# Patient Record
Sex: Female | Born: 1945
Health system: Southern US, Community
[De-identification: ages and names within clinical notes are randomized; demographics above are authoritative.]

## PROBLEM LIST (undated history)

## (undated) DIAGNOSIS — I4892 Unspecified atrial flutter: Secondary | ICD-10-CM

## (undated) DIAGNOSIS — M797 Fibromyalgia: Secondary | ICD-10-CM

## (undated) DIAGNOSIS — K449 Diaphragmatic hernia without obstruction or gangrene: Secondary | ICD-10-CM

## (undated) DIAGNOSIS — G9332 Myalgic encephalomyelitis/chronic fatigue syndrome: Secondary | ICD-10-CM

## (undated) DIAGNOSIS — Z789 Other specified health status: Secondary | ICD-10-CM

## (undated) DIAGNOSIS — I350 Nonrheumatic aortic (valve) stenosis: Secondary | ICD-10-CM

## (undated) DIAGNOSIS — I493 Ventricular premature depolarization: Secondary | ICD-10-CM

## (undated) DIAGNOSIS — Z972 Presence of dental prosthetic device (complete) (partial): Secondary | ICD-10-CM

## (undated) DIAGNOSIS — K219 Gastro-esophageal reflux disease without esophagitis: Secondary | ICD-10-CM

## (undated) DIAGNOSIS — I10 Essential (primary) hypertension: Secondary | ICD-10-CM

## (undated) DIAGNOSIS — I441 Atrioventricular block, second degree: Secondary | ICD-10-CM

## (undated) DIAGNOSIS — Z960 Presence of urogenital implants: Secondary | ICD-10-CM

## (undated) DIAGNOSIS — I77810 Thoracic aortic ectasia: Secondary | ICD-10-CM

## (undated) DIAGNOSIS — G4733 Obstructive sleep apnea (adult) (pediatric): Secondary | ICD-10-CM

## (undated) DIAGNOSIS — M545 Low back pain, unspecified: Secondary | ICD-10-CM

## (undated) DIAGNOSIS — H8110 Benign paroxysmal vertigo, unspecified ear: Secondary | ICD-10-CM

## (undated) DIAGNOSIS — N3020 Other chronic cystitis without hematuria: Secondary | ICD-10-CM

## (undated) DIAGNOSIS — R5382 Chronic fatigue, unspecified: Secondary | ICD-10-CM

## (undated) DIAGNOSIS — Z973 Presence of spectacles and contact lenses: Secondary | ICD-10-CM

## (undated) DIAGNOSIS — I447 Left bundle-branch block, unspecified: Secondary | ICD-10-CM

## (undated) DIAGNOSIS — N3010 Interstitial cystitis (chronic) without hematuria: Secondary | ICD-10-CM

## (undated) DIAGNOSIS — S83207A Unspecified tear of unspecified meniscus, current injury, left knee, initial encounter: Secondary | ICD-10-CM

## (undated) DIAGNOSIS — G588 Other specified mononeuropathies: Secondary | ICD-10-CM

## (undated) DIAGNOSIS — G8929 Other chronic pain: Secondary | ICD-10-CM

## (undated) DIAGNOSIS — R3982 Chronic bladder pain: Secondary | ICD-10-CM

## (undated) DIAGNOSIS — M48062 Spinal stenosis, lumbar region with neurogenic claudication: Secondary | ICD-10-CM

## (undated) DIAGNOSIS — Z9889 Other specified postprocedural states: Secondary | ICD-10-CM

## (undated) DIAGNOSIS — D649 Anemia, unspecified: Secondary | ICD-10-CM

## (undated) DIAGNOSIS — R112 Nausea with vomiting, unspecified: Secondary | ICD-10-CM

## (undated) DIAGNOSIS — I2510 Atherosclerotic heart disease of native coronary artery without angina pectoris: Secondary | ICD-10-CM

## (undated) HISTORY — DX: Unspecified atrial flutter: I48.92

## (undated) HISTORY — DX: Fibromyalgia: M79.7

## (undated) HISTORY — DX: Left bundle-branch block, unspecified: I44.7

## (undated) HISTORY — DX: Other chronic cystitis without hematuria: N30.20

## (undated) HISTORY — DX: Anemia, unspecified: D64.9

## (undated) HISTORY — PX: TONSILLECTOMY: SUR1361

## (undated) HISTORY — DX: Nonrheumatic aortic (valve) stenosis: I35.0

## (undated) HISTORY — DX: Gastro-esophageal reflux disease without esophagitis: K21.9

## (undated) HISTORY — DX: Thoracic aortic ectasia: I77.810

## (undated) HISTORY — DX: Ventricular premature depolarization: I49.3

## (undated) HISTORY — PX: CARDIAC CATHETERIZATION: SHX172

## (undated) HISTORY — DX: Atrioventricular block, second degree: I44.1

## (undated) HISTORY — DX: Atherosclerotic heart disease of native coronary artery without angina pectoris: I25.10

---

## 1978-04-02 HISTORY — PX: ABDOMINAL HYSTERECTOMY: SHX81

## 1982-04-02 HISTORY — PX: OTHER SURGICAL HISTORY: SHX169

## 1984-04-02 HISTORY — PX: CARPAL TUNNEL RELEASE: SHX101

## 1985-04-02 HISTORY — PX: NASAL SINUS SURGERY: SHX719

## 1990-04-02 HISTORY — PX: CHOLECYSTECTOMY: SHX55

## 1997-08-19 ENCOUNTER — Other Ambulatory Visit: Admission: RE | Admit: 1997-08-19 | Discharge: 1997-08-19 | Payer: Self-pay | Admitting: Family Medicine

## 1997-10-01 ENCOUNTER — Other Ambulatory Visit: Admission: RE | Admit: 1997-10-01 | Discharge: 1997-10-01 | Payer: Self-pay | Admitting: Urology

## 1997-10-19 ENCOUNTER — Ambulatory Visit (HOSPITAL_COMMUNITY): Admission: RE | Admit: 1997-10-19 | Discharge: 1997-10-19 | Payer: Self-pay | Admitting: Neurosurgery

## 1998-02-17 ENCOUNTER — Ambulatory Visit (HOSPITAL_COMMUNITY): Admission: RE | Admit: 1998-02-17 | Discharge: 1998-02-17 | Payer: Self-pay | Admitting: Urology

## 1998-04-22 ENCOUNTER — Encounter: Payer: Self-pay | Admitting: Gastroenterology

## 1998-04-22 ENCOUNTER — Ambulatory Visit (HOSPITAL_COMMUNITY): Admission: RE | Admit: 1998-04-22 | Discharge: 1998-04-22 | Payer: Self-pay | Admitting: Gastroenterology

## 1998-05-04 ENCOUNTER — Encounter: Payer: Self-pay | Admitting: Gastroenterology

## 1998-05-04 ENCOUNTER — Ambulatory Visit (HOSPITAL_COMMUNITY): Admission: RE | Admit: 1998-05-04 | Discharge: 1998-05-04 | Payer: Self-pay | Admitting: Gastroenterology

## 1998-05-10 ENCOUNTER — Ambulatory Visit (HOSPITAL_COMMUNITY): Admission: RE | Admit: 1998-05-10 | Discharge: 1998-05-10 | Payer: Self-pay | Admitting: Gastroenterology

## 1998-07-13 ENCOUNTER — Ambulatory Visit (HOSPITAL_COMMUNITY): Admission: RE | Admit: 1998-07-13 | Discharge: 1998-07-13 | Payer: Self-pay | Admitting: Gastroenterology

## 1998-07-19 ENCOUNTER — Other Ambulatory Visit: Admission: RE | Admit: 1998-07-19 | Discharge: 1998-07-19 | Payer: Self-pay | Admitting: Obstetrics & Gynecology

## 1999-01-10 ENCOUNTER — Encounter: Admission: RE | Admit: 1999-01-10 | Discharge: 1999-04-10 | Payer: Self-pay | Admitting: Anesthesiology

## 1999-03-13 ENCOUNTER — Ambulatory Visit (HOSPITAL_COMMUNITY): Admission: RE | Admit: 1999-03-13 | Discharge: 1999-03-13 | Payer: Self-pay | Admitting: Urology

## 1999-06-03 ENCOUNTER — Inpatient Hospital Stay (HOSPITAL_COMMUNITY): Admission: EM | Admit: 1999-06-03 | Discharge: 1999-06-05 | Payer: Self-pay | Admitting: Emergency Medicine

## 1999-06-04 ENCOUNTER — Encounter: Payer: Self-pay | Admitting: Gastroenterology

## 1999-06-20 ENCOUNTER — Encounter (INDEPENDENT_AMBULATORY_CARE_PROVIDER_SITE_OTHER): Payer: Self-pay

## 1999-06-20 ENCOUNTER — Ambulatory Visit (HOSPITAL_COMMUNITY): Admission: RE | Admit: 1999-06-20 | Discharge: 1999-06-20 | Payer: Self-pay | Admitting: Gastroenterology

## 2000-05-02 ENCOUNTER — Ambulatory Visit (HOSPITAL_COMMUNITY): Admission: RE | Admit: 2000-05-02 | Discharge: 2000-05-02 | Payer: Self-pay | Admitting: Urology

## 2000-07-13 ENCOUNTER — Emergency Department (HOSPITAL_COMMUNITY): Admission: EM | Admit: 2000-07-13 | Discharge: 2000-07-13 | Payer: Self-pay | Admitting: Emergency Medicine

## 2000-07-30 ENCOUNTER — Ambulatory Visit (HOSPITAL_COMMUNITY): Admission: RE | Admit: 2000-07-30 | Discharge: 2000-07-30 | Payer: Self-pay | Admitting: Gastroenterology

## 2000-07-30 ENCOUNTER — Encounter (INDEPENDENT_AMBULATORY_CARE_PROVIDER_SITE_OTHER): Payer: Self-pay | Admitting: Specialist

## 2000-12-20 ENCOUNTER — Encounter: Admission: RE | Admit: 2000-12-20 | Discharge: 2000-12-20 | Payer: Self-pay | Admitting: Urology

## 2000-12-20 ENCOUNTER — Encounter: Payer: Self-pay | Admitting: Urology

## 2001-01-06 ENCOUNTER — Encounter: Admission: RE | Admit: 2001-01-06 | Discharge: 2001-01-06 | Payer: Self-pay | Admitting: Urology

## 2001-01-06 ENCOUNTER — Encounter: Payer: Self-pay | Admitting: Urology

## 2001-02-21 ENCOUNTER — Other Ambulatory Visit: Admission: RE | Admit: 2001-02-21 | Discharge: 2001-02-21 | Payer: Self-pay | Admitting: Obstetrics & Gynecology

## 2001-05-07 ENCOUNTER — Encounter: Payer: Self-pay | Admitting: Urology

## 2001-05-12 ENCOUNTER — Ambulatory Visit (HOSPITAL_COMMUNITY): Admission: RE | Admit: 2001-05-12 | Discharge: 2001-05-12 | Payer: Self-pay | Admitting: Urology

## 2001-07-03 ENCOUNTER — Encounter: Admission: RE | Admit: 2001-07-03 | Discharge: 2001-07-03 | Payer: Self-pay | Admitting: Otolaryngology

## 2001-07-03 ENCOUNTER — Encounter: Payer: Self-pay | Admitting: Otolaryngology

## 2001-07-30 ENCOUNTER — Emergency Department (HOSPITAL_COMMUNITY): Admission: EM | Admit: 2001-07-30 | Discharge: 2001-07-30 | Payer: Self-pay | Admitting: Emergency Medicine

## 2001-08-21 ENCOUNTER — Ambulatory Visit (HOSPITAL_COMMUNITY): Admission: RE | Admit: 2001-08-21 | Discharge: 2001-08-21 | Payer: Self-pay | Admitting: Interventional Cardiology

## 2002-04-13 ENCOUNTER — Encounter: Admission: RE | Admit: 2002-04-13 | Discharge: 2002-04-13 | Payer: Self-pay | Admitting: Urology

## 2002-04-13 ENCOUNTER — Encounter: Payer: Self-pay | Admitting: Urology

## 2002-04-16 ENCOUNTER — Ambulatory Visit (HOSPITAL_BASED_OUTPATIENT_CLINIC_OR_DEPARTMENT_OTHER): Admission: RE | Admit: 2002-04-16 | Discharge: 2002-04-16 | Payer: Self-pay | Admitting: Urology

## 2002-06-19 ENCOUNTER — Encounter: Payer: Self-pay | Admitting: Gastroenterology

## 2002-06-19 ENCOUNTER — Encounter: Admission: RE | Admit: 2002-06-19 | Discharge: 2002-06-19 | Payer: Self-pay | Admitting: Gastroenterology

## 2002-10-07 ENCOUNTER — Other Ambulatory Visit: Admission: RE | Admit: 2002-10-07 | Discharge: 2002-10-07 | Payer: Self-pay | Admitting: Obstetrics & Gynecology

## 2002-12-08 ENCOUNTER — Emergency Department (HOSPITAL_COMMUNITY): Admission: EM | Admit: 2002-12-08 | Discharge: 2002-12-08 | Payer: Self-pay | Admitting: Emergency Medicine

## 2003-04-22 ENCOUNTER — Ambulatory Visit (HOSPITAL_COMMUNITY): Admission: RE | Admit: 2003-04-22 | Discharge: 2003-04-22 | Payer: Self-pay | Admitting: Urology

## 2003-04-22 ENCOUNTER — Ambulatory Visit (HOSPITAL_BASED_OUTPATIENT_CLINIC_OR_DEPARTMENT_OTHER): Admission: RE | Admit: 2003-04-22 | Discharge: 2003-04-22 | Payer: Self-pay | Admitting: Urology

## 2004-01-05 ENCOUNTER — Emergency Department (HOSPITAL_COMMUNITY): Admission: EM | Admit: 2004-01-05 | Discharge: 2004-01-05 | Payer: Self-pay | Admitting: Emergency Medicine

## 2004-02-15 ENCOUNTER — Encounter: Admission: RE | Admit: 2004-02-15 | Discharge: 2004-05-15 | Payer: Self-pay | Admitting: Family Medicine

## 2004-05-16 ENCOUNTER — Encounter: Admission: RE | Admit: 2004-05-16 | Discharge: 2004-06-13 | Payer: Self-pay | Admitting: Family Medicine

## 2004-06-01 ENCOUNTER — Emergency Department (HOSPITAL_COMMUNITY): Admission: EM | Admit: 2004-06-01 | Discharge: 2004-06-01 | Payer: Self-pay | Admitting: Emergency Medicine

## 2004-07-24 ENCOUNTER — Ambulatory Visit (HOSPITAL_BASED_OUTPATIENT_CLINIC_OR_DEPARTMENT_OTHER): Admission: RE | Admit: 2004-07-24 | Discharge: 2004-07-24 | Payer: Self-pay | Admitting: Urology

## 2004-07-24 ENCOUNTER — Ambulatory Visit (HOSPITAL_COMMUNITY): Admission: RE | Admit: 2004-07-24 | Discharge: 2004-07-24 | Payer: Self-pay | Admitting: Urology

## 2004-10-11 ENCOUNTER — Ambulatory Visit (HOSPITAL_COMMUNITY): Admission: RE | Admit: 2004-10-11 | Discharge: 2004-10-11 | Payer: Self-pay | Admitting: Neurosurgery

## 2005-02-12 ENCOUNTER — Emergency Department (HOSPITAL_COMMUNITY): Admission: EM | Admit: 2005-02-12 | Discharge: 2005-02-13 | Payer: Self-pay | Admitting: Emergency Medicine

## 2005-06-30 ENCOUNTER — Emergency Department (HOSPITAL_COMMUNITY): Admission: EM | Admit: 2005-06-30 | Discharge: 2005-06-30 | Payer: Self-pay | Admitting: Emergency Medicine

## 2005-11-07 ENCOUNTER — Encounter: Admission: RE | Admit: 2005-11-07 | Discharge: 2005-11-07 | Payer: Self-pay | Admitting: Neurology

## 2006-06-26 ENCOUNTER — Encounter: Admission: RE | Admit: 2006-06-26 | Discharge: 2006-06-26 | Payer: Self-pay | Admitting: Obstetrics & Gynecology

## 2008-08-20 ENCOUNTER — Encounter: Admission: RE | Admit: 2008-08-20 | Discharge: 2008-11-18 | Payer: Self-pay | Admitting: Family Medicine

## 2008-11-23 ENCOUNTER — Encounter: Admission: RE | Admit: 2008-11-23 | Discharge: 2008-12-24 | Payer: Self-pay | Admitting: Family Medicine

## 2009-08-19 ENCOUNTER — Ambulatory Visit (HOSPITAL_BASED_OUTPATIENT_CLINIC_OR_DEPARTMENT_OTHER): Admission: RE | Admit: 2009-08-19 | Discharge: 2009-08-19 | Payer: Self-pay | Admitting: Urology

## 2009-08-22 ENCOUNTER — Emergency Department (HOSPITAL_BASED_OUTPATIENT_CLINIC_OR_DEPARTMENT_OTHER): Admission: EM | Admit: 2009-08-22 | Discharge: 2009-08-22 | Payer: Self-pay | Admitting: Emergency Medicine

## 2009-08-22 ENCOUNTER — Emergency Department (HOSPITAL_COMMUNITY): Admission: EM | Admit: 2009-08-22 | Discharge: 2009-08-22 | Payer: Self-pay | Admitting: Emergency Medicine

## 2010-04-22 ENCOUNTER — Encounter: Payer: Self-pay | Admitting: Obstetrics & Gynecology

## 2010-04-23 ENCOUNTER — Encounter: Payer: Self-pay | Admitting: Obstetrics & Gynecology

## 2010-06-19 LAB — POCT CARDIAC MARKERS
CKMB, poc: 3.1 ng/mL (ref 1.0–8.0)
Myoglobin, poc: 62.2 ng/mL (ref 12–200)
Troponin i, poc: <0.05 ng/mL (ref 0.00–0.09)

## 2010-06-19 LAB — POCT I-STAT, CHEM 8
BUN: 18 mg/dL (ref 6–23)
Calcium, Ion: 1.08 mmol/L — ABNORMAL LOW (ref 1.12–1.32)
Chloride: 108 mEq/L (ref 96–112)
Creatinine, Ser: 0.7 mg/dL (ref 0.4–1.2)
Glucose, Bld: 101 mg/dL — ABNORMAL HIGH (ref 70–99)
HCT: 39 % (ref 36.0–46.0)
Hemoglobin: 13.3 g/dL (ref 12.0–15.0)
Potassium: 4.4 mEq/L (ref 3.5–5.1)
Sodium: 139 mEq/L (ref 135–145)
TCO2: 26 mmol/L (ref 0–100)

## 2010-06-19 LAB — LIPASE, BLOOD: Lipase: 36 U/L (ref 23–300)

## 2010-08-18 NOTE — Cardiovascular Report (Signed)
[place]. [facility]  Patient:    Jennifer [name], Jennifer [name] Visit Number: [id number] MRN: [id number]          Service Type: CAT Location: [facility] 2899 11 Attending Physician:  Lyn Records. Iii Dictated by:   Darci Needle, M.D. Proc. Date: 08/21/01 Admit Date:  08/21/2001 Discharge Date: 08/21/2001   CC:         Caryn Bee L. Little, M.D.  Chales Salmon. Abigail Miyamoto, M.D.  Jamesetta Geralds, M.D.  Florencia Reasons, M.D.   Cardiac Catheterization  INDICATIONS:  Recurring chest discomfort.  The patient has an EKG with small inferior Q-waves, and has been told by Dr. Tery Sanfilippo that she has had a prior infarction.  The patient at this point is preoperatively being evaluated for esophageal reflux surgery, but she is unwilling to go forward until she is certain her heart is normal and not the source of her chest pain.  PROCEDURES PERFORMED: 1. Left heart catheterization. 2. Selective coronary angiography. 3. Left ventriculography.  DESCRIPTION OF PROCEDURE:  After informed consent, a 6-French sheath was placed in the right femoral artery using the modified Seldinger technique.  A 6-French A2 multipurpose catheter was used for hemodynamic recordings, left ventriculography by hand injection, and right coronary angiography. Manipulation of the multipurpose catheter suggested to me that the aortic root may be somewhat enlarged.  A #4 left Judkins catheter was used for left coronary angiography.  The patient tolerated the procedure without complications.  She received 2 mg of IV Versed.  RESULTS:  HEMODYNAMIC DATA: 1. Aortic pressure:  146/79. 2. Left ventricular pressure:  146/19.  LEFT VENTRICULOGRAPHY:  The left ventricle appears normal in size and demonstrates normal contractility.  CORONARY ANGIOGRAPHY: 1. LEFT MAIN CORONARY:  The left main is long and normal. 2. LEFT ANTERIOR DESCENDING ARTERY:  Reaches the left ventricular apex.  It    gives origin to two diagonal  branches.  The LAD system is normal. 3. CIRCUMFLEX ARTERY:  Gives origin to two obtuse marginal branches.  The    circumflex system is normal. 4. RIGHT CORONARY ARTERY: The right coronary is large and dominant.  It gives    origin to a large PDA and two large left ventricular branches.  It is    entirely normal.  CONCLUSIONS: 1. Normal coronary arteries. 2. Normal left ventricular function. 3. Suggestion of aortic root enlargement.  PLAN: 1. No further ischemic evaluation. 2. 2-D echocardiogram to assess the aortic root size. Dictated by:   Darci Needle, M.D. Attending Physician:  Lyn Records. Iii DD:  08/21/01 TD:  08/23/01 Job: [id number]

## 2010-08-18 NOTE — Procedures (Signed)
[place]. [facility]  Patient:    Jennifer [name], Jennifer [name]             MRN: [id number] Proc. Date: 07/30/00 Adm. Date:  [id number] Attending:  Rich Brave CC:         Dr. Illa Level  Dr. Sima Matas, Department of Surgery, [facility]             [facility], [place], [place]   Procedure Report  PROCEDURE:  Upper endoscopy with biopsies.  INDICATION:  A 65 year old female with refractory reflux symptoms, being considered for antireflux therapy who, despite ongoing therapy with 20-40 mg of Prilosec daily, has had a recent exacerbation of reflux symptoms, better in the past few days but intensified over the preceding several weeks.  FINDINGS: 1. No esophagitis. 2. Small gastric polyps as previously noted. 3. Short-segment Barretts esophagus.  DESCRIPTION OF PROCEDURE:  The nature, purpose, and risks of the procedure were familiar to the patient from prior examinations.  She provided written consent.  Sedation was fentanyl 100 mcg and Versed 10 mg IV without arrhythmias or desaturation.  The Olympus small-caliber adult video endoscope was passed under direct vision.  The vocal cords were unremarkable.  The esophagus was entered.  The esophagus was free of any evident gastroesophageal reflux, and there was no active reflux esophagitis, but there was a very small 1 cm tongue of Barretts-appearing mucosa above the squamocolumnar junction, and there was an intermittent appearance of a small hiatal hernia with an esophageal ring present.  The stomach was entered.  It contained a small, clear residual.  The gastric mucosa was free of inflammatory changes, but there were numerous gastric polyps near the angularis and in the fundus of the stomach, as previously noted.  Several of these were biopsied.  The largest ones were probably about 1-1.5 cm across.  They were pale in color, fleshy, not  erythematous, and they were certainly benign in appearance.  No erosions, ulcers, large masses, or gastritis were observed.  The pylorus, duodenal bulb, and second duodenum looked normal.  After biopsying the gastric polyps and the short segment of Barretts esophagus described above, the scope was removed from the patient, who tolerated the procedure well and without apparent complication.  IMPRESSION: 1. 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 2. No active esophagitis to correlate with recent increase in reflux symptoms. 3. Gastric polyps, as previously noted. 4. Intermittent small hiatal hernia and esophageal ring.  PLAN:  Await pathology on todays biopsies.  Continue PPI therapy. DD:  07/30/00 TD:  07/30/00 Job: [id number]

## 2010-08-18 NOTE — Op Note (Signed)
Jennifer [name], Jennifer [name]              ACCOUNT NO.:  [id number]   MEDICAL RECORD NO.:  [id number]          PATIENT TYPE:  AMB   LOCATION:  [facility]                         FACILITY:  [facility]   PHYSICIAN:  Sigmund I. Patsi Sears, M.D.DATE OF BIRTH:  May 02, 1945   DATE OF PROCEDURE:  07/24/2004  DATE OF DISCHARGE:                                 OPERATIVE REPORT   PREOPERATIVE DIAGNOSES:  Interstitial cystitis.   POSTOPERATIVE DIAGNOSES:  Interstitial cystitis.   OPERATION:  Cystourethroscopy, hydrodistention of the bladder, Marcaine and  Pyridium insertion in the bladder, injection of Marcaine and Kenalog in her  bladder base.   SURGEON:  Sigmund I. Patsi Sears, M.D.   ANESTHESIA:  General LMA.   PREPARATION:  After appropriate preanesthesia, the patient was brought to  the operating room, placed on the operating table in dorsal supine position  where general LMA anesthesia was introduced. The patient was then replaced  in dorsal lithotomy position where the pubis was prepped with Betadine  solution and draped in the usual fashion.   DESCRIPTION OF PROCEDURE:  Cystourethroscopy was accomplished and Cysto HOD  was accomplished to 800 plus mL. Following this, the bladder was drained of  fluid, and rehydrodistended. Bloody urine was noted and repeat cystoscopy  showed the bladder base to be extensively involved with interstitial  cystitis, as has been noted in previous cystoscopies. The Pyridium Marcaine  solution is inserted in the bladder, and Marcaine and Kenalog solution is  injected in the bladder base. The patient was awakened and taken to the  recovery room in good condition.      SIT/MEDQ  D:  07/24/2004  T:  07/24/2004  Job:  [id number]

## 2010-10-03 ENCOUNTER — Other Ambulatory Visit: Payer: Self-pay | Admitting: Gastroenterology

## 2010-10-06 ENCOUNTER — Other Ambulatory Visit: Payer: Self-pay

## 2010-10-09 ENCOUNTER — Ambulatory Visit
Admission: RE | Admit: 2010-10-09 | Discharge: 2010-10-09 | Disposition: A | Discharge status: Home / Self Care | Payer: Medicare Other | Source: Ambulatory Visit | Attending: Gastroenterology | Admitting: Gastroenterology

## 2010-10-09 MED ORDER — IOHEXOL 300 MG/ML  SOLN
125.0000 mL | Freq: Once | INTRAMUSCULAR | Status: AC | PRN
  Administered 2010-10-09: 125 mL via INTRAVENOUS

## 2010-12-19 ENCOUNTER — Other Ambulatory Visit: Payer: Self-pay | Admitting: Gastroenterology

## 2010-12-19 DIAGNOSIS — R1013 Epigastric pain: Secondary | ICD-10-CM

## 2011-04-10 DIAGNOSIS — F411 Generalized anxiety disorder: Secondary | ICD-10-CM | POA: Diagnosis not present

## 2011-04-18 DIAGNOSIS — N3020 Other chronic cystitis without hematuria: Secondary | ICD-10-CM | POA: Diagnosis not present

## 2011-04-18 DIAGNOSIS — R350 Frequency of micturition: Secondary | ICD-10-CM | POA: Diagnosis not present

## 2011-04-18 DIAGNOSIS — R310 Gross hematuria: Secondary | ICD-10-CM | POA: Diagnosis not present

## 2011-04-18 DIAGNOSIS — N3010 Interstitial cystitis (chronic) without hematuria: Secondary | ICD-10-CM | POA: Diagnosis not present

## 2011-04-24 DIAGNOSIS — F411 Generalized anxiety disorder: Secondary | ICD-10-CM | POA: Diagnosis not present

## 2011-05-02 DIAGNOSIS — R300 Dysuria: Secondary | ICD-10-CM | POA: Diagnosis not present

## 2011-05-02 DIAGNOSIS — N3010 Interstitial cystitis (chronic) without hematuria: Secondary | ICD-10-CM | POA: Diagnosis not present

## 2011-05-09 DIAGNOSIS — N3010 Interstitial cystitis (chronic) without hematuria: Secondary | ICD-10-CM | POA: Diagnosis not present

## 2011-05-10 DIAGNOSIS — F411 Generalized anxiety disorder: Secondary | ICD-10-CM | POA: Diagnosis not present

## 2011-05-15 DIAGNOSIS — F411 Generalized anxiety disorder: Secondary | ICD-10-CM | POA: Diagnosis not present

## 2011-05-21 DIAGNOSIS — N3010 Interstitial cystitis (chronic) without hematuria: Secondary | ICD-10-CM | POA: Diagnosis not present

## 2011-05-25 DIAGNOSIS — G4733 Obstructive sleep apnea (adult) (pediatric): Secondary | ICD-10-CM | POA: Diagnosis not present

## 2011-06-05 DIAGNOSIS — F411 Generalized anxiety disorder: Secondary | ICD-10-CM | POA: Diagnosis not present

## 2011-06-12 DIAGNOSIS — F411 Generalized anxiety disorder: Secondary | ICD-10-CM | POA: Diagnosis not present

## 2011-06-19 DIAGNOSIS — F411 Generalized anxiety disorder: Secondary | ICD-10-CM | POA: Diagnosis not present

## 2011-06-25 DIAGNOSIS — N3010 Interstitial cystitis (chronic) without hematuria: Secondary | ICD-10-CM | POA: Diagnosis not present

## 2011-06-25 DIAGNOSIS — N3020 Other chronic cystitis without hematuria: Secondary | ICD-10-CM | POA: Diagnosis not present

## 2011-06-28 DIAGNOSIS — K5730 Diverticulosis of large intestine without perforation or abscess without bleeding: Secondary | ICD-10-CM | POA: Diagnosis not present

## 2011-06-28 DIAGNOSIS — M545 Low back pain, unspecified: Secondary | ICD-10-CM | POA: Diagnosis not present

## 2011-06-28 DIAGNOSIS — R109 Unspecified abdominal pain: Secondary | ICD-10-CM | POA: Diagnosis not present

## 2011-06-28 DIAGNOSIS — R3129 Other microscopic hematuria: Secondary | ICD-10-CM | POA: Diagnosis not present

## 2011-07-03 ENCOUNTER — Ambulatory Visit: Payer: Medicare Other

## 2011-07-04 ENCOUNTER — Ambulatory Visit: Payer: Medicare Other | Attending: Family Medicine

## 2011-07-04 ENCOUNTER — Ambulatory Visit: Payer: Medicare Other | Admitting: Rehabilitative and Restorative Service Providers"

## 2011-07-04 DIAGNOSIS — M542 Cervicalgia: Secondary | ICD-10-CM | POA: Insufficient documentation

## 2011-07-04 DIAGNOSIS — M2569 Stiffness of other specified joint, not elsewhere classified: Secondary | ICD-10-CM | POA: Diagnosis not present

## 2011-07-04 DIAGNOSIS — IMO0001 Reserved for inherently not codable concepts without codable children: Secondary | ICD-10-CM | POA: Diagnosis not present

## 2011-07-04 DIAGNOSIS — F411 Generalized anxiety disorder: Secondary | ICD-10-CM | POA: Diagnosis not present

## 2011-07-06 ENCOUNTER — Ambulatory Visit: Payer: Medicare Other | Admitting: Physical Therapy

## 2011-07-06 DIAGNOSIS — IMO0001 Reserved for inherently not codable concepts without codable children: Secondary | ICD-10-CM | POA: Diagnosis not present

## 2011-07-06 DIAGNOSIS — M542 Cervicalgia: Secondary | ICD-10-CM | POA: Diagnosis not present

## 2011-07-06 DIAGNOSIS — M2569 Stiffness of other specified joint, not elsewhere classified: Secondary | ICD-10-CM | POA: Diagnosis not present

## 2011-07-10 ENCOUNTER — Ambulatory Visit: Payer: Medicare Other | Admitting: Physical Therapy

## 2011-07-10 DIAGNOSIS — M542 Cervicalgia: Secondary | ICD-10-CM | POA: Diagnosis not present

## 2011-07-10 DIAGNOSIS — IMO0001 Reserved for inherently not codable concepts without codable children: Secondary | ICD-10-CM | POA: Diagnosis not present

## 2011-07-10 DIAGNOSIS — M2569 Stiffness of other specified joint, not elsewhere classified: Secondary | ICD-10-CM | POA: Diagnosis not present

## 2011-07-10 DIAGNOSIS — F411 Generalized anxiety disorder: Secondary | ICD-10-CM | POA: Diagnosis not present

## 2011-07-13 ENCOUNTER — Ambulatory Visit: Payer: Medicare Other | Admitting: Physical Therapy

## 2011-07-13 DIAGNOSIS — IMO0001 Reserved for inherently not codable concepts without codable children: Secondary | ICD-10-CM | POA: Diagnosis not present

## 2011-07-13 DIAGNOSIS — M542 Cervicalgia: Secondary | ICD-10-CM | POA: Diagnosis not present

## 2011-07-13 DIAGNOSIS — M2569 Stiffness of other specified joint, not elsewhere classified: Secondary | ICD-10-CM | POA: Diagnosis not present

## 2011-07-16 ENCOUNTER — Encounter: Payer: Medicare Other | Admitting: Physical Therapy

## 2011-07-16 DIAGNOSIS — N3020 Other chronic cystitis without hematuria: Secondary | ICD-10-CM | POA: Diagnosis not present

## 2011-07-16 DIAGNOSIS — F411 Generalized anxiety disorder: Secondary | ICD-10-CM | POA: Diagnosis not present

## 2011-07-18 ENCOUNTER — Ambulatory Visit: Payer: Medicare Other

## 2011-07-18 DIAGNOSIS — M542 Cervicalgia: Secondary | ICD-10-CM | POA: Diagnosis not present

## 2011-07-18 DIAGNOSIS — IMO0001 Reserved for inherently not codable concepts without codable children: Secondary | ICD-10-CM | POA: Diagnosis not present

## 2011-07-18 DIAGNOSIS — M2569 Stiffness of other specified joint, not elsewhere classified: Secondary | ICD-10-CM | POA: Diagnosis not present

## 2011-07-18 DIAGNOSIS — N3010 Interstitial cystitis (chronic) without hematuria: Secondary | ICD-10-CM | POA: Diagnosis not present

## 2011-07-23 ENCOUNTER — Ambulatory Visit: Payer: Medicare Other | Admitting: Physical Therapy

## 2011-07-23 DIAGNOSIS — M542 Cervicalgia: Secondary | ICD-10-CM | POA: Diagnosis not present

## 2011-07-23 DIAGNOSIS — M2569 Stiffness of other specified joint, not elsewhere classified: Secondary | ICD-10-CM | POA: Diagnosis not present

## 2011-07-23 DIAGNOSIS — IMO0001 Reserved for inherently not codable concepts without codable children: Secondary | ICD-10-CM | POA: Diagnosis not present

## 2011-07-24 ENCOUNTER — Encounter: Payer: Medicare Other | Admitting: Physical Therapy

## 2011-07-24 DIAGNOSIS — F411 Generalized anxiety disorder: Secondary | ICD-10-CM | POA: Diagnosis not present

## 2011-07-25 ENCOUNTER — Encounter: Payer: Medicare Other | Admitting: Physical Therapy

## 2011-07-26 ENCOUNTER — Ambulatory Visit: Payer: Medicare Other

## 2011-07-26 DIAGNOSIS — M542 Cervicalgia: Secondary | ICD-10-CM | POA: Diagnosis not present

## 2011-07-26 DIAGNOSIS — IMO0001 Reserved for inherently not codable concepts without codable children: Secondary | ICD-10-CM | POA: Diagnosis not present

## 2011-07-26 DIAGNOSIS — M2569 Stiffness of other specified joint, not elsewhere classified: Secondary | ICD-10-CM | POA: Diagnosis not present

## 2011-07-27 ENCOUNTER — Encounter: Payer: Medicare Other | Admitting: Physical Therapy

## 2011-07-30 ENCOUNTER — Encounter: Payer: Medicare Other | Admitting: Physical Therapy

## 2011-07-31 ENCOUNTER — Encounter: Payer: Medicare Other | Admitting: Physical Therapy

## 2011-08-01 ENCOUNTER — Ambulatory Visit: Payer: Medicare Other | Attending: Family Medicine | Admitting: Physical Therapy

## 2011-08-01 DIAGNOSIS — IMO0001 Reserved for inherently not codable concepts without codable children: Secondary | ICD-10-CM | POA: Diagnosis not present

## 2011-08-01 DIAGNOSIS — M545 Low back pain, unspecified: Secondary | ICD-10-CM | POA: Insufficient documentation

## 2011-08-01 DIAGNOSIS — R5381 Other malaise: Secondary | ICD-10-CM | POA: Insufficient documentation

## 2011-08-01 DIAGNOSIS — M25659 Stiffness of unspecified hip, not elsewhere classified: Secondary | ICD-10-CM | POA: Insufficient documentation

## 2011-08-03 DIAGNOSIS — F411 Generalized anxiety disorder: Secondary | ICD-10-CM | POA: Diagnosis not present

## 2011-08-10 ENCOUNTER — Encounter: Payer: Medicare Other | Admitting: Physical Therapy

## 2011-08-10 DIAGNOSIS — F411 Generalized anxiety disorder: Secondary | ICD-10-CM | POA: Diagnosis not present

## 2011-08-14 ENCOUNTER — Ambulatory Visit: Payer: Medicare Other

## 2011-08-14 DIAGNOSIS — F411 Generalized anxiety disorder: Secondary | ICD-10-CM | POA: Diagnosis not present

## 2011-08-16 ENCOUNTER — Ambulatory Visit: Payer: Medicare Other

## 2011-08-17 ENCOUNTER — Encounter: Payer: Medicare Other | Admitting: Physical Therapy

## 2011-08-20 ENCOUNTER — Encounter: Payer: Medicare Other | Admitting: Physical Therapy

## 2011-08-20 ENCOUNTER — Ambulatory Visit: Payer: Medicare Other | Admitting: Physical Therapy

## 2011-08-20 DIAGNOSIS — F411 Generalized anxiety disorder: Secondary | ICD-10-CM | POA: Diagnosis not present

## 2011-08-23 ENCOUNTER — Ambulatory Visit: Payer: Medicare Other

## 2011-08-23 DIAGNOSIS — R300 Dysuria: Secondary | ICD-10-CM | POA: Diagnosis not present

## 2011-08-23 DIAGNOSIS — N3010 Interstitial cystitis (chronic) without hematuria: Secondary | ICD-10-CM | POA: Diagnosis not present

## 2011-08-23 DIAGNOSIS — N3020 Other chronic cystitis without hematuria: Secondary | ICD-10-CM | POA: Diagnosis not present

## 2011-08-28 ENCOUNTER — Ambulatory Visit: Payer: Medicare Other

## 2011-08-31 ENCOUNTER — Ambulatory Visit: Payer: Medicare Other | Admitting: Physical Therapy

## 2011-08-31 DIAGNOSIS — F411 Generalized anxiety disorder: Secondary | ICD-10-CM | POA: Diagnosis not present

## 2011-09-06 DIAGNOSIS — F411 Generalized anxiety disorder: Secondary | ICD-10-CM | POA: Diagnosis not present

## 2011-09-07 DIAGNOSIS — N3010 Interstitial cystitis (chronic) without hematuria: Secondary | ICD-10-CM | POA: Diagnosis not present

## 2011-09-07 DIAGNOSIS — N3020 Other chronic cystitis without hematuria: Secondary | ICD-10-CM | POA: Diagnosis not present

## 2011-09-12 DIAGNOSIS — F411 Generalized anxiety disorder: Secondary | ICD-10-CM | POA: Diagnosis not present

## 2011-09-14 DIAGNOSIS — N3010 Interstitial cystitis (chronic) without hematuria: Secondary | ICD-10-CM | POA: Diagnosis not present

## 2011-09-14 DIAGNOSIS — F411 Generalized anxiety disorder: Secondary | ICD-10-CM | POA: Diagnosis not present

## 2011-09-18 DIAGNOSIS — F411 Generalized anxiety disorder: Secondary | ICD-10-CM | POA: Diagnosis not present

## 2011-09-20 DIAGNOSIS — F411 Generalized anxiety disorder: Secondary | ICD-10-CM | POA: Diagnosis not present

## 2011-09-21 DIAGNOSIS — N3020 Other chronic cystitis without hematuria: Secondary | ICD-10-CM | POA: Diagnosis not present

## 2011-09-27 DIAGNOSIS — F411 Generalized anxiety disorder: Secondary | ICD-10-CM | POA: Diagnosis not present

## 2011-09-27 DIAGNOSIS — N3010 Interstitial cystitis (chronic) without hematuria: Secondary | ICD-10-CM | POA: Diagnosis not present

## 2011-09-27 DIAGNOSIS — N3020 Other chronic cystitis without hematuria: Secondary | ICD-10-CM | POA: Diagnosis not present

## 2011-10-01 DIAGNOSIS — K219 Gastro-esophageal reflux disease without esophagitis: Secondary | ICD-10-CM | POA: Diagnosis not present

## 2011-10-01 DIAGNOSIS — R198 Other specified symptoms and signs involving the digestive system and abdomen: Secondary | ICD-10-CM | POA: Diagnosis not present

## 2011-10-01 DIAGNOSIS — R1013 Epigastric pain: Secondary | ICD-10-CM | POA: Diagnosis not present

## 2011-10-01 DIAGNOSIS — K591 Functional diarrhea: Secondary | ICD-10-CM | POA: Diagnosis not present

## 2011-10-03 DIAGNOSIS — F411 Generalized anxiety disorder: Secondary | ICD-10-CM | POA: Diagnosis not present

## 2011-10-12 DIAGNOSIS — N390 Urinary tract infection, site not specified: Secondary | ICD-10-CM | POA: Diagnosis not present

## 2011-10-12 DIAGNOSIS — F411 Generalized anxiety disorder: Secondary | ICD-10-CM | POA: Diagnosis not present

## 2011-10-18 DIAGNOSIS — F411 Generalized anxiety disorder: Secondary | ICD-10-CM | POA: Diagnosis not present

## 2011-10-26 DIAGNOSIS — F411 Generalized anxiety disorder: Secondary | ICD-10-CM | POA: Diagnosis not present

## 2011-11-01 DIAGNOSIS — N3010 Interstitial cystitis (chronic) without hematuria: Secondary | ICD-10-CM | POA: Diagnosis not present

## 2011-11-01 DIAGNOSIS — F411 Generalized anxiety disorder: Secondary | ICD-10-CM | POA: Diagnosis not present

## 2011-11-08 DIAGNOSIS — F411 Generalized anxiety disorder: Secondary | ICD-10-CM | POA: Diagnosis not present

## 2011-11-19 ENCOUNTER — Ambulatory Visit: Payer: Medicare Other | Attending: Family Medicine

## 2011-11-19 DIAGNOSIS — IMO0001 Reserved for inherently not codable concepts without codable children: Secondary | ICD-10-CM | POA: Insufficient documentation

## 2011-11-19 DIAGNOSIS — M545 Low back pain, unspecified: Secondary | ICD-10-CM | POA: Diagnosis not present

## 2011-11-19 DIAGNOSIS — M546 Pain in thoracic spine: Secondary | ICD-10-CM | POA: Insufficient documentation

## 2011-11-19 DIAGNOSIS — R5381 Other malaise: Secondary | ICD-10-CM | POA: Insufficient documentation

## 2011-11-22 ENCOUNTER — Encounter: Payer: Medicare Other | Admitting: Physical Therapy

## 2011-11-22 DIAGNOSIS — F411 Generalized anxiety disorder: Secondary | ICD-10-CM | POA: Diagnosis not present

## 2011-11-23 ENCOUNTER — Ambulatory Visit: Payer: Medicare Other

## 2011-11-27 ENCOUNTER — Ambulatory Visit: Payer: Medicare Other | Admitting: Physical Therapy

## 2011-11-28 ENCOUNTER — Encounter: Payer: Medicare Other | Admitting: Physical Therapy

## 2011-11-29 ENCOUNTER — Encounter: Payer: Medicare Other | Admitting: Physical Therapy

## 2011-11-30 ENCOUNTER — Ambulatory Visit: Payer: Medicare Other | Admitting: Physical Therapy

## 2011-12-04 ENCOUNTER — Ambulatory Visit: Payer: Medicare Other | Attending: Family Medicine | Admitting: Physical Therapy

## 2011-12-04 DIAGNOSIS — R5381 Other malaise: Secondary | ICD-10-CM | POA: Insufficient documentation

## 2011-12-04 DIAGNOSIS — M545 Low back pain, unspecified: Secondary | ICD-10-CM | POA: Insufficient documentation

## 2011-12-04 DIAGNOSIS — M546 Pain in thoracic spine: Secondary | ICD-10-CM | POA: Insufficient documentation

## 2011-12-04 DIAGNOSIS — IMO0001 Reserved for inherently not codable concepts without codable children: Secondary | ICD-10-CM | POA: Insufficient documentation

## 2011-12-07 ENCOUNTER — Ambulatory Visit: Payer: Medicare Other | Admitting: Physical Therapy

## 2011-12-12 ENCOUNTER — Ambulatory Visit: Payer: Medicare Other | Admitting: Physical Therapy

## 2011-12-12 DIAGNOSIS — H00019 Hordeolum externum unspecified eye, unspecified eyelid: Secondary | ICD-10-CM | POA: Diagnosis not present

## 2011-12-13 DIAGNOSIS — F411 Generalized anxiety disorder: Secondary | ICD-10-CM | POA: Diagnosis not present

## 2011-12-14 ENCOUNTER — Ambulatory Visit: Payer: Medicare Other | Admitting: Physical Therapy

## 2011-12-19 ENCOUNTER — Ambulatory Visit: Payer: Medicare Other | Admitting: Physical Therapy

## 2011-12-21 ENCOUNTER — Ambulatory Visit: Payer: Medicare Other

## 2011-12-25 DIAGNOSIS — F411 Generalized anxiety disorder: Secondary | ICD-10-CM | POA: Diagnosis not present

## 2012-01-04 DIAGNOSIS — F411 Generalized anxiety disorder: Secondary | ICD-10-CM | POA: Diagnosis not present

## 2012-01-08 DIAGNOSIS — N3010 Interstitial cystitis (chronic) without hematuria: Secondary | ICD-10-CM | POA: Diagnosis not present

## 2012-01-08 DIAGNOSIS — N3020 Other chronic cystitis without hematuria: Secondary | ICD-10-CM | POA: Diagnosis not present

## 2012-01-10 ENCOUNTER — Other Ambulatory Visit: Payer: Self-pay | Admitting: Family Medicine

## 2012-01-10 DIAGNOSIS — M545 Low back pain, unspecified: Secondary | ICD-10-CM | POA: Diagnosis not present

## 2012-01-10 DIAGNOSIS — M79605 Pain in left leg: Secondary | ICD-10-CM

## 2012-01-10 DIAGNOSIS — Z23 Encounter for immunization: Secondary | ICD-10-CM | POA: Diagnosis not present

## 2012-01-10 DIAGNOSIS — L2089 Other atopic dermatitis: Secondary | ICD-10-CM | POA: Diagnosis not present

## 2012-01-10 DIAGNOSIS — M79604 Pain in right leg: Secondary | ICD-10-CM

## 2012-01-11 ENCOUNTER — Other Ambulatory Visit: Payer: Medicare Other

## 2012-01-14 DIAGNOSIS — F411 Generalized anxiety disorder: Secondary | ICD-10-CM | POA: Diagnosis not present

## 2012-01-15 ENCOUNTER — Ambulatory Visit
Admission: RE | Admit: 2012-01-15 | Discharge: 2012-01-15 | Disposition: A | Discharge status: Home / Self Care | Payer: Medicare Other | Source: Ambulatory Visit | Attending: Family Medicine | Admitting: Family Medicine

## 2012-01-15 DIAGNOSIS — M79605 Pain in left leg: Secondary | ICD-10-CM

## 2012-01-15 DIAGNOSIS — M47817 Spondylosis without myelopathy or radiculopathy, lumbosacral region: Secondary | ICD-10-CM | POA: Diagnosis not present

## 2012-01-15 DIAGNOSIS — M4120 Other idiopathic scoliosis, site unspecified: Secondary | ICD-10-CM | POA: Diagnosis not present

## 2012-01-15 DIAGNOSIS — M545 Low back pain: Secondary | ICD-10-CM

## 2012-01-15 DIAGNOSIS — M79604 Pain in right leg: Secondary | ICD-10-CM

## 2012-01-16 DIAGNOSIS — F411 Generalized anxiety disorder: Secondary | ICD-10-CM | POA: Diagnosis not present

## 2012-01-17 DIAGNOSIS — N3010 Interstitial cystitis (chronic) without hematuria: Secondary | ICD-10-CM | POA: Diagnosis not present

## 2012-01-24 DIAGNOSIS — F411 Generalized anxiety disorder: Secondary | ICD-10-CM | POA: Diagnosis not present

## 2012-01-29 DIAGNOSIS — F411 Generalized anxiety disorder: Secondary | ICD-10-CM | POA: Diagnosis not present

## 2012-01-29 DIAGNOSIS — N390 Urinary tract infection, site not specified: Secondary | ICD-10-CM | POA: Diagnosis not present

## 2012-02-01 DIAGNOSIS — N390 Urinary tract infection, site not specified: Secondary | ICD-10-CM | POA: Diagnosis not present

## 2012-02-08 DIAGNOSIS — F411 Generalized anxiety disorder: Secondary | ICD-10-CM | POA: Diagnosis not present

## 2012-02-12 DIAGNOSIS — F411 Generalized anxiety disorder: Secondary | ICD-10-CM | POA: Diagnosis not present

## 2012-02-15 DIAGNOSIS — N3010 Interstitial cystitis (chronic) without hematuria: Secondary | ICD-10-CM | POA: Diagnosis not present

## 2012-02-15 DIAGNOSIS — R300 Dysuria: Secondary | ICD-10-CM | POA: Diagnosis not present

## 2012-02-21 DIAGNOSIS — F411 Generalized anxiety disorder: Secondary | ICD-10-CM | POA: Diagnosis not present

## 2012-02-27 DIAGNOSIS — F411 Generalized anxiety disorder: Secondary | ICD-10-CM | POA: Diagnosis not present

## 2012-02-27 DIAGNOSIS — N3010 Interstitial cystitis (chronic) without hematuria: Secondary | ICD-10-CM | POA: Diagnosis not present

## 2012-03-04 DIAGNOSIS — N3010 Interstitial cystitis (chronic) without hematuria: Secondary | ICD-10-CM | POA: Diagnosis not present

## 2012-03-04 DIAGNOSIS — R300 Dysuria: Secondary | ICD-10-CM | POA: Diagnosis not present

## 2012-03-13 DIAGNOSIS — N3010 Interstitial cystitis (chronic) without hematuria: Secondary | ICD-10-CM | POA: Diagnosis not present

## 2012-03-21 DIAGNOSIS — F411 Generalized anxiety disorder: Secondary | ICD-10-CM | POA: Diagnosis not present

## 2012-04-01 DIAGNOSIS — F411 Generalized anxiety disorder: Secondary | ICD-10-CM | POA: Diagnosis not present

## 2012-04-09 DIAGNOSIS — N3010 Interstitial cystitis (chronic) without hematuria: Secondary | ICD-10-CM | POA: Diagnosis not present

## 2012-04-09 DIAGNOSIS — F411 Generalized anxiety disorder: Secondary | ICD-10-CM | POA: Diagnosis not present

## 2012-04-09 DIAGNOSIS — R300 Dysuria: Secondary | ICD-10-CM | POA: Diagnosis not present

## 2012-04-11 DIAGNOSIS — F411 Generalized anxiety disorder: Secondary | ICD-10-CM | POA: Diagnosis not present

## 2012-04-23 DIAGNOSIS — F411 Generalized anxiety disorder: Secondary | ICD-10-CM | POA: Diagnosis not present

## 2012-05-01 DIAGNOSIS — N3010 Interstitial cystitis (chronic) without hematuria: Secondary | ICD-10-CM | POA: Diagnosis not present

## 2012-05-07 DIAGNOSIS — F411 Generalized anxiety disorder: Secondary | ICD-10-CM | POA: Diagnosis not present

## 2012-05-12 DIAGNOSIS — M545 Low back pain, unspecified: Secondary | ICD-10-CM | POA: Diagnosis not present

## 2012-05-12 DIAGNOSIS — M4120 Other idiopathic scoliosis, site unspecified: Secondary | ICD-10-CM | POA: Diagnosis not present

## 2012-05-22 DIAGNOSIS — F411 Generalized anxiety disorder: Secondary | ICD-10-CM | POA: Diagnosis not present

## 2012-05-27 DIAGNOSIS — F411 Generalized anxiety disorder: Secondary | ICD-10-CM | POA: Diagnosis not present

## 2012-05-29 ENCOUNTER — Encounter: Payer: Self-pay | Admitting: Physical Medicine & Rehabilitation

## 2012-06-04 DIAGNOSIS — R141 Gas pain: Secondary | ICD-10-CM | POA: Diagnosis not present

## 2012-06-04 DIAGNOSIS — Z79899 Other long term (current) drug therapy: Secondary | ICD-10-CM | POA: Diagnosis not present

## 2012-06-04 DIAGNOSIS — R142 Eructation: Secondary | ICD-10-CM | POA: Diagnosis not present

## 2012-06-04 DIAGNOSIS — R143 Flatulence: Secondary | ICD-10-CM | POA: Diagnosis not present

## 2012-06-04 DIAGNOSIS — K591 Functional diarrhea: Secondary | ICD-10-CM | POA: Diagnosis not present

## 2012-06-04 DIAGNOSIS — K219 Gastro-esophageal reflux disease without esophagitis: Secondary | ICD-10-CM | POA: Diagnosis not present

## 2012-06-05 DIAGNOSIS — J029 Acute pharyngitis, unspecified: Secondary | ICD-10-CM | POA: Diagnosis not present

## 2012-06-11 DIAGNOSIS — H8110 Benign paroxysmal vertigo, unspecified ear: Secondary | ICD-10-CM | POA: Diagnosis not present

## 2012-06-16 DIAGNOSIS — F411 Generalized anxiety disorder: Secondary | ICD-10-CM | POA: Diagnosis not present

## 2012-06-23 ENCOUNTER — Ambulatory Visit: Payer: Medicare Other | Admitting: Physical Medicine & Rehabilitation

## 2012-06-23 DIAGNOSIS — F411 Generalized anxiety disorder: Secondary | ICD-10-CM | POA: Diagnosis not present

## 2012-06-26 DIAGNOSIS — F411 Generalized anxiety disorder: Secondary | ICD-10-CM | POA: Diagnosis not present

## 2012-07-01 DIAGNOSIS — F411 Generalized anxiety disorder: Secondary | ICD-10-CM | POA: Diagnosis not present

## 2012-07-04 ENCOUNTER — Ambulatory Visit: Payer: Medicare Other | Admitting: Physical Medicine & Rehabilitation

## 2012-07-07 DIAGNOSIS — F411 Generalized anxiety disorder: Secondary | ICD-10-CM | POA: Diagnosis not present

## 2012-07-25 DIAGNOSIS — F411 Generalized anxiety disorder: Secondary | ICD-10-CM | POA: Diagnosis not present

## 2012-08-01 DIAGNOSIS — F411 Generalized anxiety disorder: Secondary | ICD-10-CM | POA: Diagnosis not present

## 2012-08-05 DIAGNOSIS — F411 Generalized anxiety disorder: Secondary | ICD-10-CM | POA: Diagnosis not present

## 2012-08-08 ENCOUNTER — Ambulatory Visit: Payer: Medicare Other | Admitting: Physical Medicine & Rehabilitation

## 2012-08-12 DIAGNOSIS — J040 Acute laryngitis: Secondary | ICD-10-CM | POA: Diagnosis not present

## 2012-08-15 DIAGNOSIS — F411 Generalized anxiety disorder: Secondary | ICD-10-CM | POA: Diagnosis not present

## 2012-08-19 DIAGNOSIS — R040 Epistaxis: Secondary | ICD-10-CM | POA: Diagnosis not present

## 2012-08-21 DIAGNOSIS — J342 Deviated nasal septum: Secondary | ICD-10-CM | POA: Diagnosis not present

## 2012-08-26 DIAGNOSIS — F411 Generalized anxiety disorder: Secondary | ICD-10-CM | POA: Diagnosis not present

## 2012-09-03 DIAGNOSIS — R300 Dysuria: Secondary | ICD-10-CM | POA: Diagnosis not present

## 2012-09-03 DIAGNOSIS — N3020 Other chronic cystitis without hematuria: Secondary | ICD-10-CM | POA: Diagnosis not present

## 2012-09-04 DIAGNOSIS — F411 Generalized anxiety disorder: Secondary | ICD-10-CM | POA: Diagnosis not present

## 2012-09-08 DIAGNOSIS — F411 Generalized anxiety disorder: Secondary | ICD-10-CM | POA: Diagnosis not present

## 2012-09-12 DIAGNOSIS — F411 Generalized anxiety disorder: Secondary | ICD-10-CM | POA: Diagnosis not present

## 2012-09-17 DIAGNOSIS — N3010 Interstitial cystitis (chronic) without hematuria: Secondary | ICD-10-CM | POA: Diagnosis not present

## 2012-09-22 DIAGNOSIS — F411 Generalized anxiety disorder: Secondary | ICD-10-CM | POA: Diagnosis not present

## 2012-09-26 DIAGNOSIS — F411 Generalized anxiety disorder: Secondary | ICD-10-CM | POA: Diagnosis not present

## 2012-09-26 DIAGNOSIS — N3010 Interstitial cystitis (chronic) without hematuria: Secondary | ICD-10-CM | POA: Diagnosis not present

## 2012-09-30 DIAGNOSIS — F411 Generalized anxiety disorder: Secondary | ICD-10-CM | POA: Diagnosis not present

## 2012-10-01 DIAGNOSIS — N3010 Interstitial cystitis (chronic) without hematuria: Secondary | ICD-10-CM | POA: Diagnosis not present

## 2012-10-01 DIAGNOSIS — R300 Dysuria: Secondary | ICD-10-CM | POA: Diagnosis not present

## 2012-10-06 DIAGNOSIS — R21 Rash and other nonspecific skin eruption: Secondary | ICD-10-CM | POA: Diagnosis not present

## 2012-10-13 DIAGNOSIS — F411 Generalized anxiety disorder: Secondary | ICD-10-CM | POA: Diagnosis not present

## 2012-10-27 DIAGNOSIS — Z23 Encounter for immunization: Secondary | ICD-10-CM | POA: Diagnosis not present

## 2012-10-27 DIAGNOSIS — I4949 Other premature depolarization: Secondary | ICD-10-CM | POA: Diagnosis not present

## 2012-10-27 DIAGNOSIS — M549 Dorsalgia, unspecified: Secondary | ICD-10-CM | POA: Diagnosis not present

## 2012-10-27 DIAGNOSIS — E559 Vitamin D deficiency, unspecified: Secondary | ICD-10-CM | POA: Diagnosis not present

## 2012-10-27 DIAGNOSIS — D649 Anemia, unspecified: Secondary | ICD-10-CM | POA: Diagnosis not present

## 2012-10-27 DIAGNOSIS — I10 Essential (primary) hypertension: Secondary | ICD-10-CM | POA: Diagnosis not present

## 2012-11-12 DIAGNOSIS — F411 Generalized anxiety disorder: Secondary | ICD-10-CM | POA: Diagnosis not present

## 2012-11-25 DIAGNOSIS — F411 Generalized anxiety disorder: Secondary | ICD-10-CM | POA: Diagnosis not present

## 2012-12-12 DIAGNOSIS — F411 Generalized anxiety disorder: Secondary | ICD-10-CM | POA: Diagnosis not present

## 2012-12-16 DIAGNOSIS — F411 Generalized anxiety disorder: Secondary | ICD-10-CM | POA: Diagnosis not present

## 2012-12-19 DIAGNOSIS — F411 Generalized anxiety disorder: Secondary | ICD-10-CM | POA: Diagnosis not present

## 2012-12-23 DIAGNOSIS — F411 Generalized anxiety disorder: Secondary | ICD-10-CM | POA: Diagnosis not present

## 2012-12-30 DIAGNOSIS — F411 Generalized anxiety disorder: Secondary | ICD-10-CM | POA: Diagnosis not present

## 2013-01-12 DIAGNOSIS — F411 Generalized anxiety disorder: Secondary | ICD-10-CM | POA: Diagnosis not present

## 2013-01-14 DIAGNOSIS — N3010 Interstitial cystitis (chronic) without hematuria: Secondary | ICD-10-CM | POA: Diagnosis not present

## 2013-01-19 DIAGNOSIS — J329 Chronic sinusitis, unspecified: Secondary | ICD-10-CM | POA: Diagnosis not present

## 2013-01-22 DIAGNOSIS — F411 Generalized anxiety disorder: Secondary | ICD-10-CM | POA: Diagnosis not present

## 2013-02-03 DIAGNOSIS — F411 Generalized anxiety disorder: Secondary | ICD-10-CM | POA: Diagnosis not present

## 2013-02-12 DIAGNOSIS — F411 Generalized anxiety disorder: Secondary | ICD-10-CM | POA: Diagnosis not present

## 2013-02-20 DIAGNOSIS — N3010 Interstitial cystitis (chronic) without hematuria: Secondary | ICD-10-CM | POA: Diagnosis not present

## 2013-02-20 DIAGNOSIS — N3020 Other chronic cystitis without hematuria: Secondary | ICD-10-CM | POA: Diagnosis not present

## 2013-02-20 DIAGNOSIS — R300 Dysuria: Secondary | ICD-10-CM | POA: Diagnosis not present

## 2013-02-24 DIAGNOSIS — F411 Generalized anxiety disorder: Secondary | ICD-10-CM | POA: Diagnosis not present

## 2013-03-03 DIAGNOSIS — N3010 Interstitial cystitis (chronic) without hematuria: Secondary | ICD-10-CM | POA: Diagnosis not present

## 2013-03-05 DIAGNOSIS — F411 Generalized anxiety disorder: Secondary | ICD-10-CM | POA: Diagnosis not present

## 2013-03-11 DIAGNOSIS — N3010 Interstitial cystitis (chronic) without hematuria: Secondary | ICD-10-CM | POA: Diagnosis not present

## 2013-03-27 IMAGING — CT CT ABDOMEN W/ CM
2 of 5 series · 14 of 36 positions shown, 19 images · IV contrast (READICAT/WATER & [ID] OMNI 300)
Comparison: 11/04/2009

CLINICAL DATA: Upper abdominal pain, nausea. GERD. Prior cecal-
cystoplasty for interstitial cystitis.  Appendectomy, hysterectomy,
cholecystectomy.

CT ABDOMEN WITH CONTRAST
TECHNIQUE: Multidetector CT imaging of the abdomen was performed
using the standard protocol following bolus administration of
intravenous contrast.
Contrast: 125 ml [id number] IV

[Series 601: coronal body · coronal · 0.85mm/px · 1 of 151 slices shown, 2 images]
[im 51/151  soft-tissue]
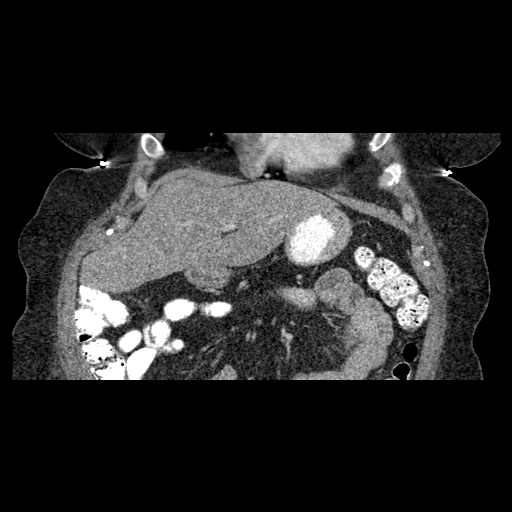
[im 51/151  bone]
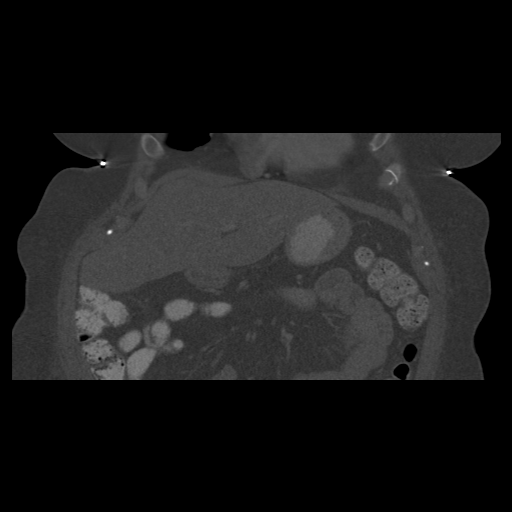

[Series 602: sagittal body · sagittal · 0.85mm/px · 13 of 176 slices shown, 17 images]
[im 9/176  soft-tissue]
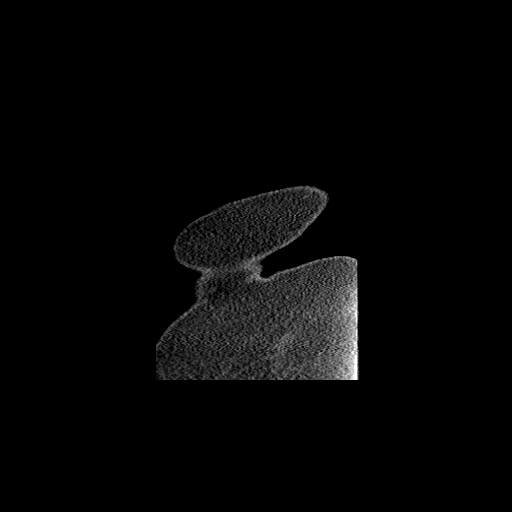
[im 9/176  lung]
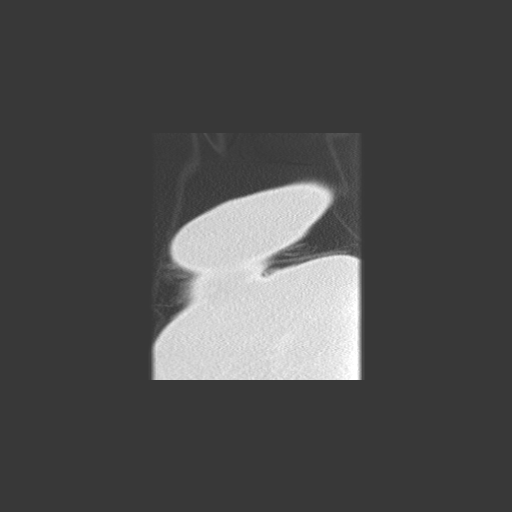
[im 9/176  bone]
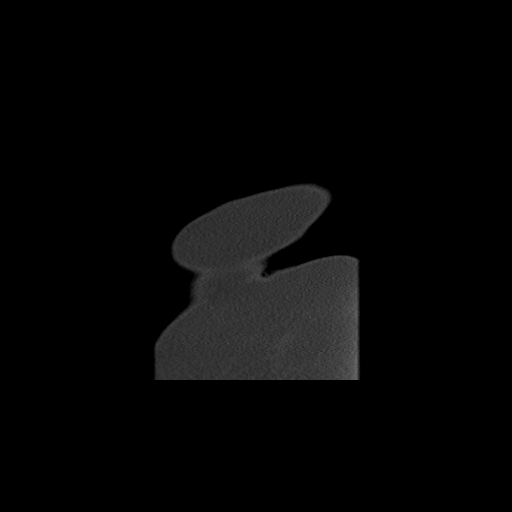
[im 18/176  lung]
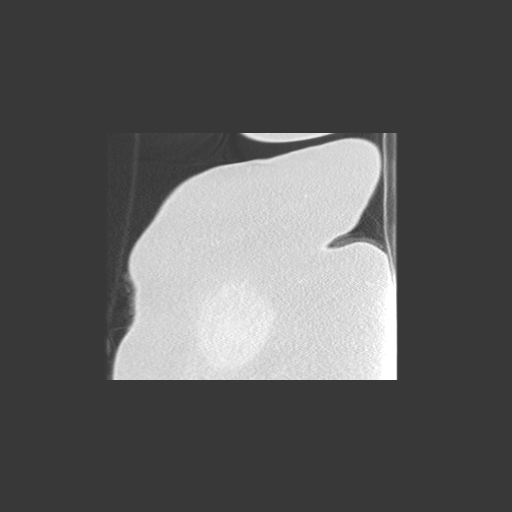
[im 27/176  soft-tissue]
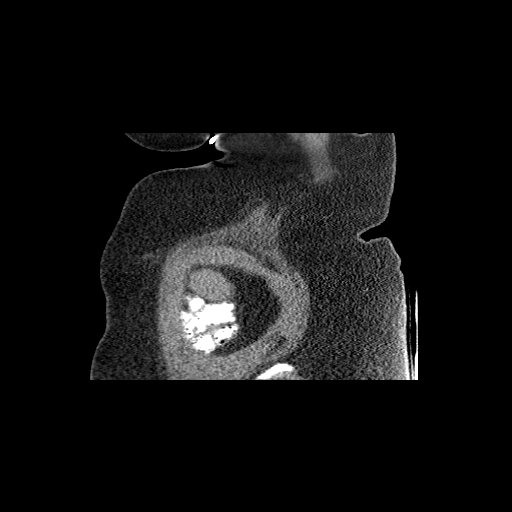
[im 27/176  lung]
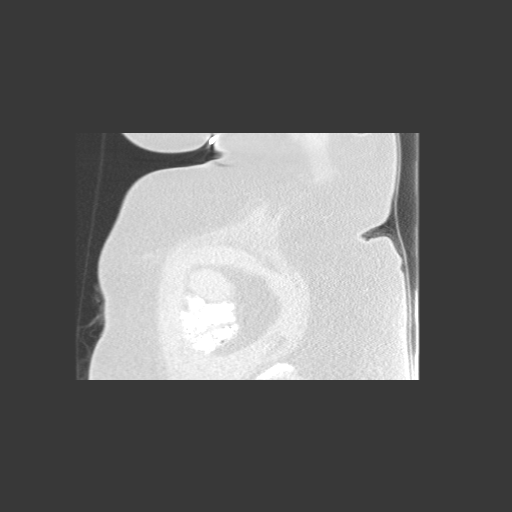
[im 36/176  lung]
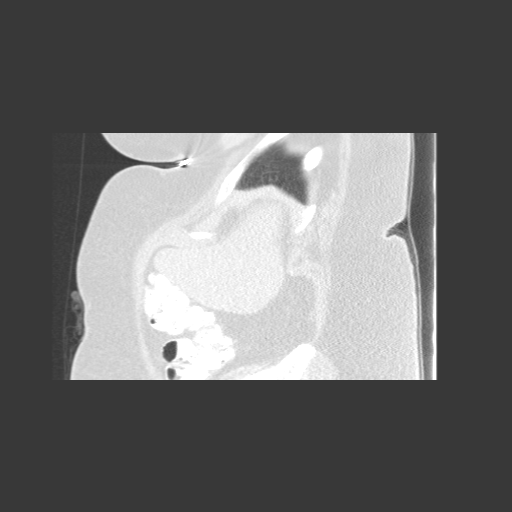
[im 44/176  soft-tissue]
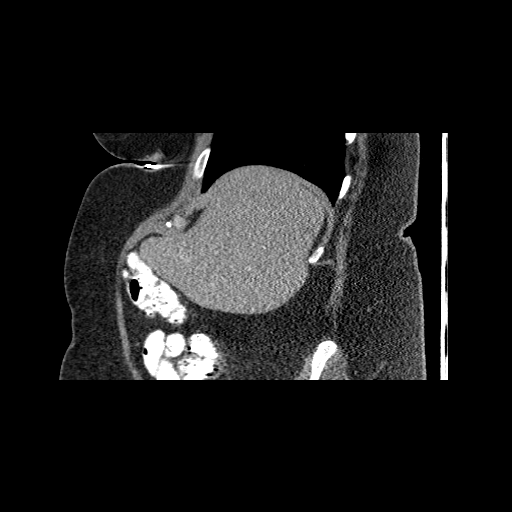
[im 62/176  soft-tissue]
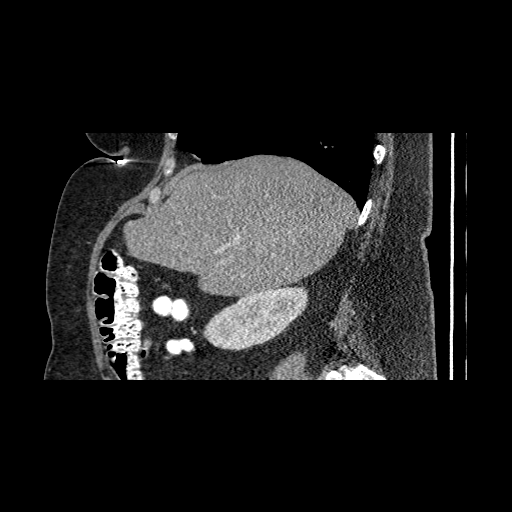
[im 71/176  soft-tissue]
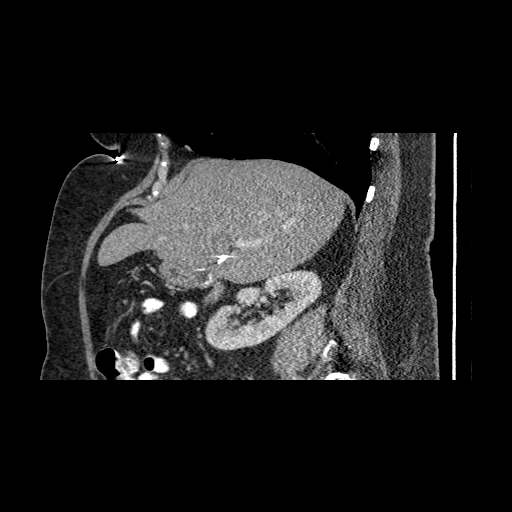
[im 88/176  soft-tissue]
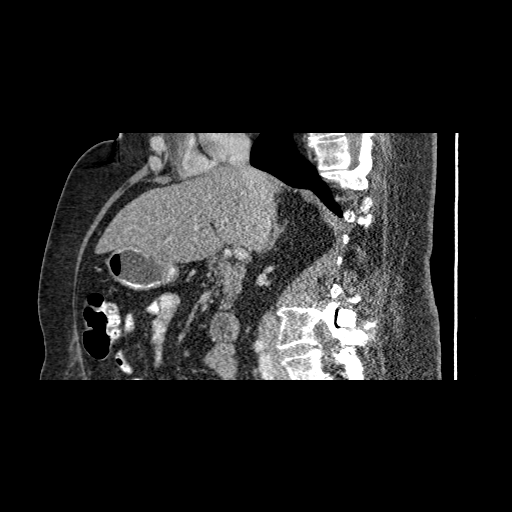
[im 106/176  soft-tissue]
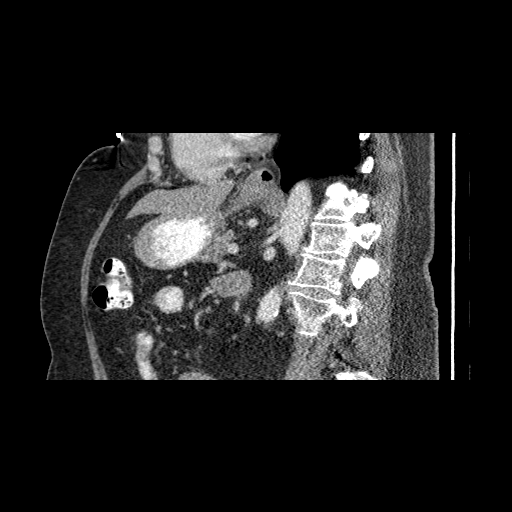
[im 114/176  soft-tissue]
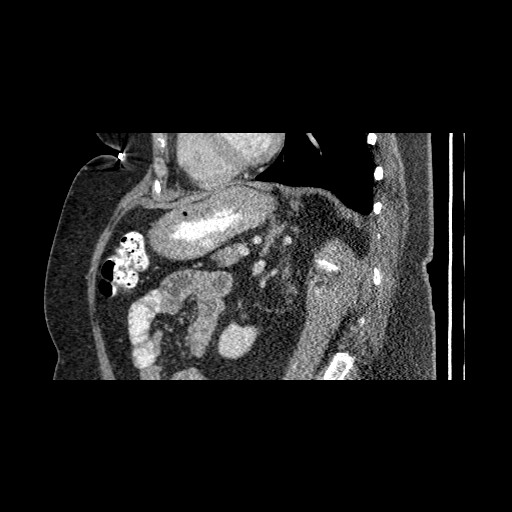
[im 132/176  soft-tissue]
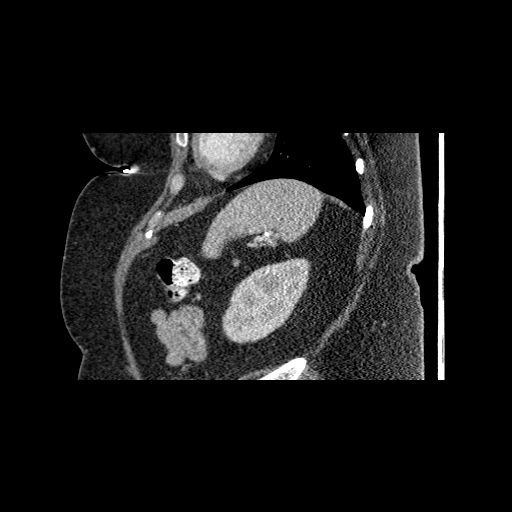
[im 132/176  bone]
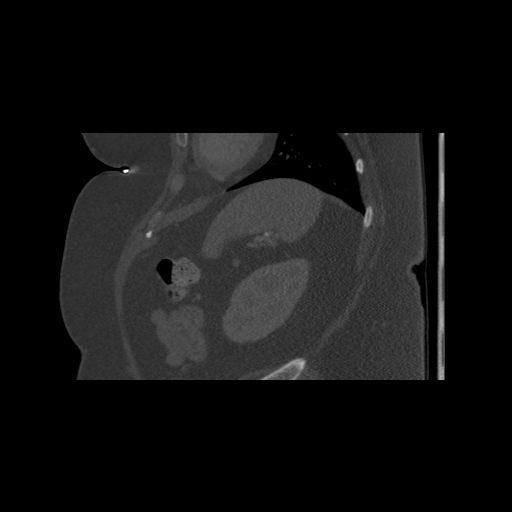
[im 149/176  soft-tissue]
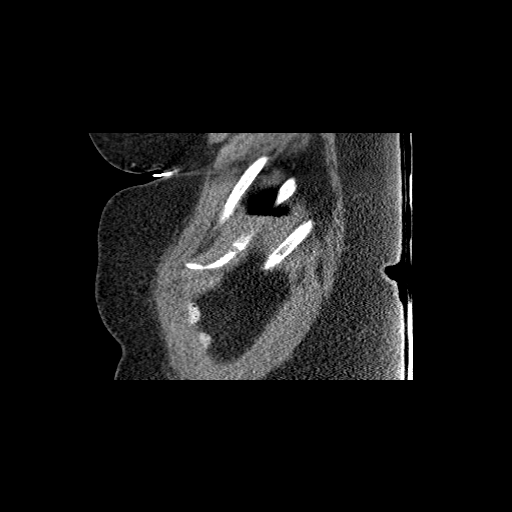
[im 167/176  soft-tissue]
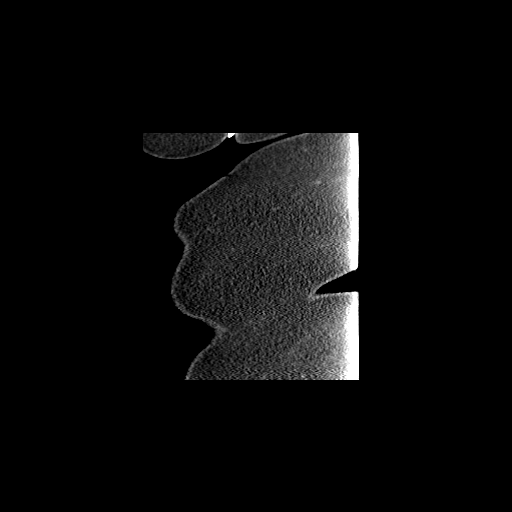

[14 of 36 positions shown; findings below may reference images not displayed]

FINDINGS: Lung bases are clear.

Small hiatal hernia.

Liver, spleen, pancreas, and adrenal glands within normal limits.

Status post cholecystectomy.  No intrahepatic or extrahepatic
ductal dilatation.

Kidneys are within normal limits.  No renal calculi or
hydronephrosis.

No findings to suggest bowel obstruction.  No abdominal ascites.
No free air.
IMPRESSION: Small hiatal hernia.

Status post cholecystectomy.  No intrahepatic or extrahepatic
ductal dilatation.

No CT findings to account for the patient's abdominal pain.

## 2013-03-31 DIAGNOSIS — F411 Generalized anxiety disorder: Secondary | ICD-10-CM | POA: Diagnosis not present

## 2013-04-08 DIAGNOSIS — I10 Essential (primary) hypertension: Secondary | ICD-10-CM | POA: Diagnosis not present

## 2013-04-08 DIAGNOSIS — R799 Abnormal finding of blood chemistry, unspecified: Secondary | ICD-10-CM | POA: Diagnosis not present

## 2013-04-08 DIAGNOSIS — E559 Vitamin D deficiency, unspecified: Secondary | ICD-10-CM | POA: Diagnosis not present

## 2013-04-10 DIAGNOSIS — M549 Dorsalgia, unspecified: Secondary | ICD-10-CM | POA: Diagnosis not present

## 2013-04-10 DIAGNOSIS — F411 Generalized anxiety disorder: Secondary | ICD-10-CM | POA: Diagnosis not present

## 2013-04-10 DIAGNOSIS — Z23 Encounter for immunization: Secondary | ICD-10-CM | POA: Diagnosis not present

## 2013-04-10 DIAGNOSIS — G4730 Sleep apnea, unspecified: Secondary | ICD-10-CM | POA: Diagnosis not present

## 2013-04-10 DIAGNOSIS — I10 Essential (primary) hypertension: Secondary | ICD-10-CM | POA: Diagnosis not present

## 2013-04-10 DIAGNOSIS — E559 Vitamin D deficiency, unspecified: Secondary | ICD-10-CM | POA: Diagnosis not present

## 2013-04-14 DIAGNOSIS — F411 Generalized anxiety disorder: Secondary | ICD-10-CM | POA: Diagnosis not present

## 2013-04-27 DIAGNOSIS — F411 Generalized anxiety disorder: Secondary | ICD-10-CM | POA: Diagnosis not present

## 2013-04-27 DIAGNOSIS — R110 Nausea: Secondary | ICD-10-CM | POA: Diagnosis not present

## 2013-04-27 DIAGNOSIS — R42 Dizziness and giddiness: Secondary | ICD-10-CM | POA: Diagnosis not present

## 2013-04-27 DIAGNOSIS — T887XXA Unspecified adverse effect of drug or medicament, initial encounter: Secondary | ICD-10-CM | POA: Diagnosis not present

## 2013-04-28 ENCOUNTER — Encounter: Payer: Self-pay | Admitting: Physical Medicine & Rehabilitation

## 2013-04-29 DIAGNOSIS — H8110 Benign paroxysmal vertigo, unspecified ear: Secondary | ICD-10-CM | POA: Diagnosis not present

## 2013-04-29 DIAGNOSIS — F411 Generalized anxiety disorder: Secondary | ICD-10-CM | POA: Diagnosis not present

## 2013-04-29 DIAGNOSIS — I10 Essential (primary) hypertension: Secondary | ICD-10-CM | POA: Diagnosis not present

## 2013-04-29 DIAGNOSIS — M549 Dorsalgia, unspecified: Secondary | ICD-10-CM | POA: Diagnosis not present

## 2013-04-30 DIAGNOSIS — F411 Generalized anxiety disorder: Secondary | ICD-10-CM | POA: Diagnosis not present

## 2013-05-07 DIAGNOSIS — J040 Acute laryngitis: Secondary | ICD-10-CM | POA: Diagnosis not present

## 2013-05-13 DIAGNOSIS — J040 Acute laryngitis: Secondary | ICD-10-CM | POA: Diagnosis not present

## 2013-05-14 DIAGNOSIS — F411 Generalized anxiety disorder: Secondary | ICD-10-CM | POA: Diagnosis not present

## 2013-06-01 ENCOUNTER — Ambulatory Visit: Payer: Medicare Other | Admitting: Physical Medicine & Rehabilitation

## 2013-06-03 DIAGNOSIS — H6520 Chronic serous otitis media, unspecified ear: Secondary | ICD-10-CM | POA: Diagnosis not present

## 2013-06-08 DIAGNOSIS — F411 Generalized anxiety disorder: Secondary | ICD-10-CM | POA: Diagnosis not present

## 2013-06-11 DIAGNOSIS — H6520 Chronic serous otitis media, unspecified ear: Secondary | ICD-10-CM | POA: Diagnosis not present

## 2013-06-15 DIAGNOSIS — K219 Gastro-esophageal reflux disease without esophagitis: Secondary | ICD-10-CM | POA: Diagnosis not present

## 2013-06-15 DIAGNOSIS — K591 Functional diarrhea: Secondary | ICD-10-CM | POA: Diagnosis not present

## 2013-06-18 DIAGNOSIS — I10 Essential (primary) hypertension: Secondary | ICD-10-CM | POA: Diagnosis not present

## 2013-06-18 DIAGNOSIS — R5383 Other fatigue: Secondary | ICD-10-CM | POA: Diagnosis not present

## 2013-06-18 DIAGNOSIS — E559 Vitamin D deficiency, unspecified: Secondary | ICD-10-CM | POA: Diagnosis not present

## 2013-06-18 DIAGNOSIS — R5381 Other malaise: Secondary | ICD-10-CM | POA: Diagnosis not present

## 2013-06-19 DIAGNOSIS — F411 Generalized anxiety disorder: Secondary | ICD-10-CM | POA: Diagnosis not present

## 2013-06-24 DIAGNOSIS — J322 Chronic ethmoidal sinusitis: Secondary | ICD-10-CM | POA: Diagnosis not present

## 2013-06-26 DIAGNOSIS — F411 Generalized anxiety disorder: Secondary | ICD-10-CM | POA: Diagnosis not present

## 2013-06-30 DIAGNOSIS — F411 Generalized anxiety disorder: Secondary | ICD-10-CM | POA: Diagnosis not present

## 2013-07-02 ENCOUNTER — Ambulatory Visit: Payer: Medicare Other | Admitting: Physical Medicine & Rehabilitation

## 2013-07-06 ENCOUNTER — Ambulatory Visit: Payer: Medicare Other | Admitting: Physical Medicine & Rehabilitation

## 2013-07-20 DIAGNOSIS — F411 Generalized anxiety disorder: Secondary | ICD-10-CM | POA: Diagnosis not present

## 2013-07-24 ENCOUNTER — Ambulatory Visit: Payer: Medicare Other | Admitting: Physical Medicine & Rehabilitation

## 2013-07-31 DIAGNOSIS — F411 Generalized anxiety disorder: Secondary | ICD-10-CM | POA: Diagnosis not present

## 2013-08-03 DIAGNOSIS — N3020 Other chronic cystitis without hematuria: Secondary | ICD-10-CM | POA: Diagnosis not present

## 2013-08-03 DIAGNOSIS — R300 Dysuria: Secondary | ICD-10-CM | POA: Diagnosis not present

## 2013-08-06 DIAGNOSIS — J322 Chronic ethmoidal sinusitis: Secondary | ICD-10-CM | POA: Diagnosis not present

## 2013-08-07 DIAGNOSIS — G4733 Obstructive sleep apnea (adult) (pediatric): Secondary | ICD-10-CM | POA: Diagnosis not present

## 2013-08-13 DIAGNOSIS — F411 Generalized anxiety disorder: Secondary | ICD-10-CM | POA: Diagnosis not present

## 2013-08-18 DIAGNOSIS — J040 Acute laryngitis: Secondary | ICD-10-CM | POA: Diagnosis not present

## 2013-08-18 DIAGNOSIS — N3010 Interstitial cystitis (chronic) without hematuria: Secondary | ICD-10-CM | POA: Diagnosis not present

## 2013-08-21 DIAGNOSIS — F411 Generalized anxiety disorder: Secondary | ICD-10-CM | POA: Diagnosis not present

## 2013-08-27 DIAGNOSIS — F411 Generalized anxiety disorder: Secondary | ICD-10-CM | POA: Diagnosis not present

## 2013-09-01 DIAGNOSIS — N3010 Interstitial cystitis (chronic) without hematuria: Secondary | ICD-10-CM | POA: Diagnosis not present

## 2013-09-02 DIAGNOSIS — F411 Generalized anxiety disorder: Secondary | ICD-10-CM | POA: Diagnosis not present

## 2013-09-08 DIAGNOSIS — J040 Acute laryngitis: Secondary | ICD-10-CM | POA: Diagnosis not present

## 2013-09-11 DIAGNOSIS — F411 Generalized anxiety disorder: Secondary | ICD-10-CM | POA: Diagnosis not present

## 2013-09-11 DIAGNOSIS — R7989 Other specified abnormal findings of blood chemistry: Secondary | ICD-10-CM | POA: Diagnosis not present

## 2013-09-11 DIAGNOSIS — M549 Dorsalgia, unspecified: Secondary | ICD-10-CM | POA: Diagnosis not present

## 2013-09-11 DIAGNOSIS — G4733 Obstructive sleep apnea (adult) (pediatric): Secondary | ICD-10-CM | POA: Diagnosis not present

## 2013-09-11 DIAGNOSIS — E559 Vitamin D deficiency, unspecified: Secondary | ICD-10-CM | POA: Diagnosis not present

## 2013-09-11 DIAGNOSIS — I10 Essential (primary) hypertension: Secondary | ICD-10-CM | POA: Diagnosis not present

## 2013-09-16 DIAGNOSIS — F411 Generalized anxiety disorder: Secondary | ICD-10-CM | POA: Diagnosis not present

## 2013-09-22 DIAGNOSIS — F411 Generalized anxiety disorder: Secondary | ICD-10-CM | POA: Diagnosis not present

## 2013-09-25 ENCOUNTER — Encounter (HOSPITAL_BASED_OUTPATIENT_CLINIC_OR_DEPARTMENT_OTHER): Payer: Medicare Other

## 2013-09-29 DIAGNOSIS — J310 Chronic rhinitis: Secondary | ICD-10-CM | POA: Diagnosis not present

## 2013-10-05 DIAGNOSIS — F411 Generalized anxiety disorder: Secondary | ICD-10-CM | POA: Diagnosis not present

## 2013-10-11 ENCOUNTER — Ambulatory Visit (HOSPITAL_BASED_OUTPATIENT_CLINIC_OR_DEPARTMENT_OTHER): Payer: Medicare Other

## 2013-10-13 DIAGNOSIS — F411 Generalized anxiety disorder: Secondary | ICD-10-CM | POA: Diagnosis not present

## 2013-10-15 DIAGNOSIS — R3989 Other symptoms and signs involving the genitourinary system: Secondary | ICD-10-CM | POA: Diagnosis not present

## 2013-10-15 DIAGNOSIS — N3020 Other chronic cystitis without hematuria: Secondary | ICD-10-CM | POA: Diagnosis not present

## 2013-10-15 DIAGNOSIS — N3010 Interstitial cystitis (chronic) without hematuria: Secondary | ICD-10-CM | POA: Diagnosis not present

## 2013-10-15 DIAGNOSIS — R300 Dysuria: Secondary | ICD-10-CM | POA: Diagnosis not present

## 2013-10-16 DIAGNOSIS — F411 Generalized anxiety disorder: Secondary | ICD-10-CM | POA: Diagnosis not present

## 2013-10-20 DIAGNOSIS — N390 Urinary tract infection, site not specified: Secondary | ICD-10-CM | POA: Diagnosis not present

## 2013-10-21 DIAGNOSIS — N390 Urinary tract infection, site not specified: Secondary | ICD-10-CM | POA: Diagnosis not present

## 2013-10-22 DIAGNOSIS — N390 Urinary tract infection, site not specified: Secondary | ICD-10-CM | POA: Diagnosis not present

## 2013-10-23 DIAGNOSIS — N390 Urinary tract infection, site not specified: Secondary | ICD-10-CM | POA: Diagnosis not present

## 2013-10-23 DIAGNOSIS — F411 Generalized anxiety disorder: Secondary | ICD-10-CM | POA: Diagnosis not present

## 2013-10-29 DIAGNOSIS — N3020 Other chronic cystitis without hematuria: Secondary | ICD-10-CM | POA: Diagnosis not present

## 2013-10-29 DIAGNOSIS — R3989 Other symptoms and signs involving the genitourinary system: Secondary | ICD-10-CM | POA: Diagnosis not present

## 2013-10-30 DIAGNOSIS — F411 Generalized anxiety disorder: Secondary | ICD-10-CM | POA: Diagnosis not present

## 2013-11-01 ENCOUNTER — Ambulatory Visit (HOSPITAL_BASED_OUTPATIENT_CLINIC_OR_DEPARTMENT_OTHER): Payer: Medicare Other | Attending: Internal Medicine

## 2013-11-02 DIAGNOSIS — R197 Diarrhea, unspecified: Secondary | ICD-10-CM | POA: Diagnosis not present

## 2013-11-06 DIAGNOSIS — F411 Generalized anxiety disorder: Secondary | ICD-10-CM | POA: Diagnosis not present

## 2013-11-10 DIAGNOSIS — F411 Generalized anxiety disorder: Secondary | ICD-10-CM | POA: Diagnosis not present

## 2013-11-16 DIAGNOSIS — F411 Generalized anxiety disorder: Secondary | ICD-10-CM | POA: Diagnosis not present

## 2013-11-18 DIAGNOSIS — N3020 Other chronic cystitis without hematuria: Secondary | ICD-10-CM | POA: Diagnosis not present

## 2013-11-20 DIAGNOSIS — E559 Vitamin D deficiency, unspecified: Secondary | ICD-10-CM | POA: Diagnosis not present

## 2013-11-20 DIAGNOSIS — L2089 Other atopic dermatitis: Secondary | ICD-10-CM | POA: Diagnosis not present

## 2013-11-20 DIAGNOSIS — F411 Generalized anxiety disorder: Secondary | ICD-10-CM | POA: Diagnosis not present

## 2013-11-20 DIAGNOSIS — R7989 Other specified abnormal findings of blood chemistry: Secondary | ICD-10-CM | POA: Diagnosis not present

## 2013-11-20 DIAGNOSIS — Z23 Encounter for immunization: Secondary | ICD-10-CM | POA: Diagnosis not present

## 2013-11-20 DIAGNOSIS — R5381 Other malaise: Secondary | ICD-10-CM | POA: Diagnosis not present

## 2013-11-20 DIAGNOSIS — I10 Essential (primary) hypertension: Secondary | ICD-10-CM | POA: Diagnosis not present

## 2013-11-22 ENCOUNTER — Encounter (HOSPITAL_BASED_OUTPATIENT_CLINIC_OR_DEPARTMENT_OTHER): Payer: Medicare Other

## 2013-11-25 DIAGNOSIS — N3020 Other chronic cystitis without hematuria: Secondary | ICD-10-CM | POA: Diagnosis not present

## 2013-11-27 DIAGNOSIS — N3010 Interstitial cystitis (chronic) without hematuria: Secondary | ICD-10-CM | POA: Diagnosis not present

## 2013-12-04 DIAGNOSIS — F411 Generalized anxiety disorder: Secondary | ICD-10-CM | POA: Diagnosis not present

## 2013-12-08 DIAGNOSIS — F411 Generalized anxiety disorder: Secondary | ICD-10-CM | POA: Diagnosis not present

## 2013-12-09 DIAGNOSIS — N3020 Other chronic cystitis without hematuria: Secondary | ICD-10-CM | POA: Diagnosis not present

## 2013-12-10 DIAGNOSIS — N3020 Other chronic cystitis without hematuria: Secondary | ICD-10-CM | POA: Diagnosis not present

## 2013-12-11 DIAGNOSIS — N3020 Other chronic cystitis without hematuria: Secondary | ICD-10-CM | POA: Diagnosis not present

## 2013-12-18 DIAGNOSIS — F411 Generalized anxiety disorder: Secondary | ICD-10-CM | POA: Diagnosis not present

## 2013-12-21 DIAGNOSIS — F411 Generalized anxiety disorder: Secondary | ICD-10-CM | POA: Diagnosis not present

## 2013-12-22 DIAGNOSIS — F411 Generalized anxiety disorder: Secondary | ICD-10-CM | POA: Diagnosis not present

## 2013-12-29 ENCOUNTER — Encounter (HOSPITAL_BASED_OUTPATIENT_CLINIC_OR_DEPARTMENT_OTHER): Payer: Medicare Other

## 2013-12-29 DIAGNOSIS — F411 Generalized anxiety disorder: Secondary | ICD-10-CM | POA: Diagnosis not present

## 2014-01-06 DIAGNOSIS — F411 Generalized anxiety disorder: Secondary | ICD-10-CM | POA: Diagnosis not present

## 2014-01-07 DIAGNOSIS — F411 Generalized anxiety disorder: Secondary | ICD-10-CM | POA: Diagnosis not present

## 2014-01-09 DIAGNOSIS — F411 Generalized anxiety disorder: Secondary | ICD-10-CM | POA: Diagnosis not present

## 2014-01-15 DIAGNOSIS — F411 Generalized anxiety disorder: Secondary | ICD-10-CM | POA: Diagnosis not present

## 2014-01-21 DIAGNOSIS — F411 Generalized anxiety disorder: Secondary | ICD-10-CM | POA: Diagnosis not present

## 2014-01-22 DIAGNOSIS — Z23 Encounter for immunization: Secondary | ICD-10-CM | POA: Diagnosis not present

## 2014-01-22 DIAGNOSIS — F418 Other specified anxiety disorders: Secondary | ICD-10-CM | POA: Diagnosis not present

## 2014-01-26 DIAGNOSIS — F411 Generalized anxiety disorder: Secondary | ICD-10-CM | POA: Diagnosis not present

## 2014-01-29 DIAGNOSIS — F411 Generalized anxiety disorder: Secondary | ICD-10-CM | POA: Diagnosis not present

## 2014-02-02 DIAGNOSIS — F411 Generalized anxiety disorder: Secondary | ICD-10-CM | POA: Diagnosis not present

## 2014-02-05 DIAGNOSIS — F411 Generalized anxiety disorder: Secondary | ICD-10-CM | POA: Diagnosis not present

## 2014-02-09 DIAGNOSIS — M5136 Other intervertebral disc degeneration, lumbar region: Secondary | ICD-10-CM | POA: Diagnosis not present

## 2014-02-09 DIAGNOSIS — M4806 Spinal stenosis, lumbar region: Secondary | ICD-10-CM | POA: Diagnosis not present

## 2014-02-09 DIAGNOSIS — M47816 Spondylosis without myelopathy or radiculopathy, lumbar region: Secondary | ICD-10-CM | POA: Diagnosis not present

## 2014-02-11 DIAGNOSIS — F411 Generalized anxiety disorder: Secondary | ICD-10-CM | POA: Diagnosis not present

## 2014-02-16 DIAGNOSIS — F411 Generalized anxiety disorder: Secondary | ICD-10-CM | POA: Diagnosis not present

## 2014-02-23 DIAGNOSIS — F411 Generalized anxiety disorder: Secondary | ICD-10-CM | POA: Diagnosis not present

## 2014-03-02 DIAGNOSIS — F411 Generalized anxiety disorder: Secondary | ICD-10-CM | POA: Diagnosis not present

## 2014-03-18 DIAGNOSIS — J321 Chronic frontal sinusitis: Secondary | ICD-10-CM | POA: Diagnosis not present

## 2014-03-18 DIAGNOSIS — J370 Chronic laryngitis: Secondary | ICD-10-CM | POA: Diagnosis not present

## 2014-03-18 DIAGNOSIS — J320 Chronic maxillary sinusitis: Secondary | ICD-10-CM | POA: Diagnosis not present

## 2014-03-19 DIAGNOSIS — F411 Generalized anxiety disorder: Secondary | ICD-10-CM | POA: Diagnosis not present

## 2014-03-30 DIAGNOSIS — F411 Generalized anxiety disorder: Secondary | ICD-10-CM | POA: Diagnosis not present

## 2014-04-05 DIAGNOSIS — J410 Simple chronic bronchitis: Secondary | ICD-10-CM | POA: Diagnosis not present

## 2014-04-05 DIAGNOSIS — J322 Chronic ethmoidal sinusitis: Secondary | ICD-10-CM | POA: Diagnosis not present

## 2014-04-05 DIAGNOSIS — J040 Acute laryngitis: Secondary | ICD-10-CM | POA: Diagnosis not present

## 2014-04-05 DIAGNOSIS — J320 Chronic maxillary sinusitis: Secondary | ICD-10-CM | POA: Diagnosis not present

## 2014-04-06 DIAGNOSIS — F411 Generalized anxiety disorder: Secondary | ICD-10-CM | POA: Diagnosis not present

## 2014-04-09 DIAGNOSIS — F411 Generalized anxiety disorder: Secondary | ICD-10-CM | POA: Diagnosis not present

## 2014-04-16 DIAGNOSIS — M9983 Other biomechanical lesions of lumbar region: Secondary | ICD-10-CM | POA: Diagnosis not present

## 2014-04-16 DIAGNOSIS — E559 Vitamin D deficiency, unspecified: Secondary | ICD-10-CM | POA: Diagnosis not present

## 2014-04-16 DIAGNOSIS — M419 Scoliosis, unspecified: Secondary | ICD-10-CM | POA: Diagnosis not present

## 2014-04-16 DIAGNOSIS — R7989 Other specified abnormal findings of blood chemistry: Secondary | ICD-10-CM | POA: Diagnosis not present

## 2014-04-16 DIAGNOSIS — I10 Essential (primary) hypertension: Secondary | ICD-10-CM | POA: Diagnosis not present

## 2014-04-27 DIAGNOSIS — F411 Generalized anxiety disorder: Secondary | ICD-10-CM | POA: Diagnosis not present

## 2014-04-29 DIAGNOSIS — F411 Generalized anxiety disorder: Secondary | ICD-10-CM | POA: Diagnosis not present

## 2014-05-03 DIAGNOSIS — F411 Generalized anxiety disorder: Secondary | ICD-10-CM | POA: Diagnosis not present

## 2014-05-11 DIAGNOSIS — M48061 Spinal stenosis, lumbar region without neurogenic claudication: Secondary | ICD-10-CM | POA: Insufficient documentation

## 2014-05-11 DIAGNOSIS — M4806 Spinal stenosis, lumbar region: Secondary | ICD-10-CM | POA: Diagnosis not present

## 2014-05-11 DIAGNOSIS — M4690 Unspecified inflammatory spondylopathy, site unspecified: Secondary | ICD-10-CM | POA: Diagnosis not present

## 2014-05-11 DIAGNOSIS — M47819 Spondylosis without myelopathy or radiculopathy, site unspecified: Secondary | ICD-10-CM | POA: Insufficient documentation

## 2014-05-11 DIAGNOSIS — M51369 Other intervertebral disc degeneration, lumbar region without mention of lumbar back pain or lower extremity pain: Secondary | ICD-10-CM | POA: Insufficient documentation

## 2014-05-11 DIAGNOSIS — Z5181 Encounter for therapeutic drug level monitoring: Secondary | ICD-10-CM | POA: Diagnosis not present

## 2014-05-11 DIAGNOSIS — M5136 Other intervertebral disc degeneration, lumbar region: Secondary | ICD-10-CM | POA: Diagnosis not present

## 2014-05-12 DIAGNOSIS — M5136 Other intervertebral disc degeneration, lumbar region: Secondary | ICD-10-CM | POA: Diagnosis not present

## 2014-05-13 DIAGNOSIS — F411 Generalized anxiety disorder: Secondary | ICD-10-CM | POA: Diagnosis not present

## 2014-05-20 DIAGNOSIS — M4806 Spinal stenosis, lumbar region: Secondary | ICD-10-CM | POA: Diagnosis not present

## 2014-05-21 DIAGNOSIS — F411 Generalized anxiety disorder: Secondary | ICD-10-CM | POA: Diagnosis not present

## 2014-05-28 DIAGNOSIS — F411 Generalized anxiety disorder: Secondary | ICD-10-CM | POA: Diagnosis not present

## 2014-06-08 DIAGNOSIS — M533 Sacrococcygeal disorders, not elsewhere classified: Secondary | ICD-10-CM | POA: Diagnosis not present

## 2014-06-10 DIAGNOSIS — N3010 Interstitial cystitis (chronic) without hematuria: Secondary | ICD-10-CM | POA: Diagnosis not present

## 2014-06-10 DIAGNOSIS — R3989 Other symptoms and signs involving the genitourinary system: Secondary | ICD-10-CM | POA: Diagnosis not present

## 2014-06-10 DIAGNOSIS — R300 Dysuria: Secondary | ICD-10-CM | POA: Diagnosis not present

## 2014-06-11 DIAGNOSIS — F411 Generalized anxiety disorder: Secondary | ICD-10-CM | POA: Diagnosis not present

## 2014-06-15 DIAGNOSIS — F411 Generalized anxiety disorder: Secondary | ICD-10-CM | POA: Diagnosis not present

## 2014-06-18 DIAGNOSIS — M4806 Spinal stenosis, lumbar region: Secondary | ICD-10-CM | POA: Diagnosis not present

## 2014-06-18 DIAGNOSIS — M4690 Unspecified inflammatory spondylopathy, site unspecified: Secondary | ICD-10-CM | POA: Diagnosis not present

## 2014-06-18 DIAGNOSIS — M5136 Other intervertebral disc degeneration, lumbar region: Secondary | ICD-10-CM | POA: Diagnosis not present

## 2014-06-24 ENCOUNTER — Other Ambulatory Visit: Payer: Self-pay | Admitting: Family Medicine

## 2014-06-24 DIAGNOSIS — M25569 Pain in unspecified knee: Secondary | ICD-10-CM | POA: Diagnosis not present

## 2014-06-24 DIAGNOSIS — M545 Low back pain: Secondary | ICD-10-CM

## 2014-06-28 ENCOUNTER — Ambulatory Visit
Admission: RE | Admit: 2014-06-28 | Discharge: 2014-06-28 | Disposition: A | Discharge status: Home / Self Care | Payer: Medicare Other | Source: Ambulatory Visit | Attending: Family Medicine | Admitting: Family Medicine

## 2014-06-28 DIAGNOSIS — M545 Low back pain: Secondary | ICD-10-CM

## 2014-06-28 DIAGNOSIS — M4806 Spinal stenosis, lumbar region: Secondary | ICD-10-CM | POA: Diagnosis not present

## 2014-06-28 DIAGNOSIS — M4186 Other forms of scoliosis, lumbar region: Secondary | ICD-10-CM | POA: Diagnosis not present

## 2014-06-28 DIAGNOSIS — M47817 Spondylosis without myelopathy or radiculopathy, lumbosacral region: Secondary | ICD-10-CM | POA: Diagnosis not present

## 2014-07-02 DIAGNOSIS — F411 Generalized anxiety disorder: Secondary | ICD-10-CM | POA: Diagnosis not present

## 2014-07-06 DIAGNOSIS — J322 Chronic ethmoidal sinusitis: Secondary | ICD-10-CM | POA: Diagnosis not present

## 2014-07-06 DIAGNOSIS — J040 Acute laryngitis: Secondary | ICD-10-CM | POA: Diagnosis not present

## 2014-07-06 DIAGNOSIS — J320 Chronic maxillary sinusitis: Secondary | ICD-10-CM | POA: Diagnosis not present

## 2014-07-08 DIAGNOSIS — F411 Generalized anxiety disorder: Secondary | ICD-10-CM | POA: Diagnosis not present

## 2014-07-15 DIAGNOSIS — F411 Generalized anxiety disorder: Secondary | ICD-10-CM | POA: Diagnosis not present

## 2014-07-16 DIAGNOSIS — F411 Generalized anxiety disorder: Secondary | ICD-10-CM | POA: Diagnosis not present

## 2014-07-22 DIAGNOSIS — F411 Generalized anxiety disorder: Secondary | ICD-10-CM | POA: Diagnosis not present

## 2014-07-29 DIAGNOSIS — F411 Generalized anxiety disorder: Secondary | ICD-10-CM | POA: Diagnosis not present

## 2014-07-30 DIAGNOSIS — M4690 Unspecified inflammatory spondylopathy, site unspecified: Secondary | ICD-10-CM | POA: Diagnosis not present

## 2014-07-30 DIAGNOSIS — M5136 Other intervertebral disc degeneration, lumbar region: Secondary | ICD-10-CM | POA: Diagnosis not present

## 2014-07-30 DIAGNOSIS — M4806 Spinal stenosis, lumbar region: Secondary | ICD-10-CM | POA: Diagnosis not present

## 2014-08-03 DIAGNOSIS — F411 Generalized anxiety disorder: Secondary | ICD-10-CM | POA: Diagnosis not present

## 2014-08-04 DIAGNOSIS — F411 Generalized anxiety disorder: Secondary | ICD-10-CM | POA: Diagnosis not present

## 2014-08-10 DIAGNOSIS — F411 Generalized anxiety disorder: Secondary | ICD-10-CM | POA: Diagnosis not present

## 2014-08-17 DIAGNOSIS — F411 Generalized anxiety disorder: Secondary | ICD-10-CM | POA: Diagnosis not present

## 2014-08-20 DIAGNOSIS — F411 Generalized anxiety disorder: Secondary | ICD-10-CM | POA: Diagnosis not present

## 2014-08-24 DIAGNOSIS — F411 Generalized anxiety disorder: Secondary | ICD-10-CM | POA: Diagnosis not present

## 2014-08-27 DIAGNOSIS — F411 Generalized anxiety disorder: Secondary | ICD-10-CM | POA: Diagnosis not present

## 2014-08-31 DIAGNOSIS — F411 Generalized anxiety disorder: Secondary | ICD-10-CM | POA: Diagnosis not present

## 2014-09-17 DIAGNOSIS — F411 Generalized anxiety disorder: Secondary | ICD-10-CM | POA: Diagnosis not present

## 2014-09-24 DIAGNOSIS — F411 Generalized anxiety disorder: Secondary | ICD-10-CM | POA: Diagnosis not present

## 2014-09-30 DIAGNOSIS — F411 Generalized anxiety disorder: Secondary | ICD-10-CM | POA: Diagnosis not present

## 2014-10-08 DIAGNOSIS — M4690 Unspecified inflammatory spondylopathy, site unspecified: Secondary | ICD-10-CM | POA: Diagnosis not present

## 2014-10-08 DIAGNOSIS — M5136 Other intervertebral disc degeneration, lumbar region: Secondary | ICD-10-CM | POA: Diagnosis not present

## 2014-10-08 DIAGNOSIS — M533 Sacrococcygeal disorders, not elsewhere classified: Secondary | ICD-10-CM | POA: Diagnosis not present

## 2014-10-08 DIAGNOSIS — M4806 Spinal stenosis, lumbar region: Secondary | ICD-10-CM | POA: Diagnosis not present

## 2014-10-18 DIAGNOSIS — J9801 Acute bronchospasm: Secondary | ICD-10-CM | POA: Diagnosis not present

## 2014-10-18 DIAGNOSIS — K219 Gastro-esophageal reflux disease without esophagitis: Secondary | ICD-10-CM | POA: Diagnosis not present

## 2014-10-19 DIAGNOSIS — F411 Generalized anxiety disorder: Secondary | ICD-10-CM | POA: Diagnosis not present

## 2014-10-29 DIAGNOSIS — F411 Generalized anxiety disorder: Secondary | ICD-10-CM | POA: Diagnosis not present

## 2014-11-04 DIAGNOSIS — K219 Gastro-esophageal reflux disease without esophagitis: Secondary | ICD-10-CM | POA: Diagnosis not present

## 2014-11-04 DIAGNOSIS — M9983 Other biomechanical lesions of lumbar region: Secondary | ICD-10-CM | POA: Diagnosis not present

## 2014-11-04 DIAGNOSIS — E780 Pure hypercholesterolemia: Secondary | ICD-10-CM | POA: Diagnosis not present

## 2014-11-04 DIAGNOSIS — E559 Vitamin D deficiency, unspecified: Secondary | ICD-10-CM | POA: Diagnosis not present

## 2014-11-04 DIAGNOSIS — R5383 Other fatigue: Secondary | ICD-10-CM | POA: Diagnosis not present

## 2014-11-04 DIAGNOSIS — I10 Essential (primary) hypertension: Secondary | ICD-10-CM | POA: Diagnosis not present

## 2014-11-05 DIAGNOSIS — F411 Generalized anxiety disorder: Secondary | ICD-10-CM | POA: Diagnosis not present

## 2014-11-09 DIAGNOSIS — E559 Vitamin D deficiency, unspecified: Secondary | ICD-10-CM | POA: Diagnosis not present

## 2014-11-09 DIAGNOSIS — I10 Essential (primary) hypertension: Secondary | ICD-10-CM | POA: Diagnosis not present

## 2014-11-09 DIAGNOSIS — N3010 Interstitial cystitis (chronic) without hematuria: Secondary | ICD-10-CM | POA: Diagnosis not present

## 2014-11-09 DIAGNOSIS — R3989 Other symptoms and signs involving the genitourinary system: Secondary | ICD-10-CM | POA: Diagnosis not present

## 2014-11-09 DIAGNOSIS — F419 Anxiety disorder, unspecified: Secondary | ICD-10-CM | POA: Diagnosis not present

## 2014-11-09 DIAGNOSIS — R350 Frequency of micturition: Secondary | ICD-10-CM | POA: Diagnosis not present

## 2014-11-12 DIAGNOSIS — F411 Generalized anxiety disorder: Secondary | ICD-10-CM | POA: Diagnosis not present

## 2014-12-07 DIAGNOSIS — R3989 Other symptoms and signs involving the genitourinary system: Secondary | ICD-10-CM | POA: Diagnosis not present

## 2014-12-07 DIAGNOSIS — N3010 Interstitial cystitis (chronic) without hematuria: Secondary | ICD-10-CM | POA: Diagnosis not present

## 2014-12-09 DIAGNOSIS — N390 Urinary tract infection, site not specified: Secondary | ICD-10-CM | POA: Diagnosis not present

## 2014-12-09 DIAGNOSIS — B9620 Unspecified Escherichia coli [E. coli] as the cause of diseases classified elsewhere: Secondary | ICD-10-CM | POA: Diagnosis not present

## 2014-12-10 DIAGNOSIS — N390 Urinary tract infection, site not specified: Secondary | ICD-10-CM | POA: Diagnosis not present

## 2014-12-10 DIAGNOSIS — F411 Generalized anxiety disorder: Secondary | ICD-10-CM | POA: Diagnosis not present

## 2014-12-10 DIAGNOSIS — B9620 Unspecified Escherichia coli [E. coli] as the cause of diseases classified elsewhere: Secondary | ICD-10-CM | POA: Diagnosis not present

## 2014-12-16 DIAGNOSIS — M5136 Other intervertebral disc degeneration, lumbar region: Secondary | ICD-10-CM | POA: Diagnosis not present

## 2014-12-16 DIAGNOSIS — M533 Sacrococcygeal disorders, not elsewhere classified: Secondary | ICD-10-CM | POA: Insufficient documentation

## 2014-12-16 DIAGNOSIS — M4806 Spinal stenosis, lumbar region: Secondary | ICD-10-CM | POA: Diagnosis not present

## 2014-12-16 DIAGNOSIS — M4690 Unspecified inflammatory spondylopathy, site unspecified: Secondary | ICD-10-CM | POA: Diagnosis not present

## 2014-12-16 DIAGNOSIS — F411 Generalized anxiety disorder: Secondary | ICD-10-CM | POA: Diagnosis not present

## 2014-12-17 DIAGNOSIS — R300 Dysuria: Secondary | ICD-10-CM | POA: Diagnosis not present

## 2014-12-17 DIAGNOSIS — N3020 Other chronic cystitis without hematuria: Secondary | ICD-10-CM | POA: Diagnosis not present

## 2014-12-17 DIAGNOSIS — R3989 Other symptoms and signs involving the genitourinary system: Secondary | ICD-10-CM | POA: Diagnosis not present

## 2014-12-21 DIAGNOSIS — F411 Generalized anxiety disorder: Secondary | ICD-10-CM | POA: Diagnosis not present

## 2014-12-24 DIAGNOSIS — N3010 Interstitial cystitis (chronic) without hematuria: Secondary | ICD-10-CM | POA: Diagnosis not present

## 2014-12-24 DIAGNOSIS — R109 Unspecified abdominal pain: Secondary | ICD-10-CM | POA: Diagnosis not present

## 2014-12-24 DIAGNOSIS — N3020 Other chronic cystitis without hematuria: Secondary | ICD-10-CM | POA: Diagnosis not present

## 2014-12-25 DIAGNOSIS — F411 Generalized anxiety disorder: Secondary | ICD-10-CM | POA: Diagnosis not present

## 2014-12-31 DIAGNOSIS — F411 Generalized anxiety disorder: Secondary | ICD-10-CM | POA: Diagnosis not present

## 2015-01-07 DIAGNOSIS — R109 Unspecified abdominal pain: Secondary | ICD-10-CM | POA: Diagnosis not present

## 2015-01-07 DIAGNOSIS — F411 Generalized anxiety disorder: Secondary | ICD-10-CM | POA: Diagnosis not present

## 2015-01-07 DIAGNOSIS — N200 Calculus of kidney: Secondary | ICD-10-CM | POA: Diagnosis not present

## 2015-01-10 DIAGNOSIS — N200 Calculus of kidney: Secondary | ICD-10-CM | POA: Diagnosis not present

## 2015-01-10 DIAGNOSIS — N3000 Acute cystitis without hematuria: Secondary | ICD-10-CM | POA: Diagnosis not present

## 2015-01-10 DIAGNOSIS — N3020 Other chronic cystitis without hematuria: Secondary | ICD-10-CM | POA: Diagnosis not present

## 2015-01-10 DIAGNOSIS — N390 Urinary tract infection, site not specified: Secondary | ICD-10-CM | POA: Diagnosis not present

## 2015-01-10 DIAGNOSIS — B9620 Unspecified Escherichia coli [E. coli] as the cause of diseases classified elsewhere: Secondary | ICD-10-CM | POA: Diagnosis not present

## 2015-01-11 DIAGNOSIS — B9620 Unspecified Escherichia coli [E. coli] as the cause of diseases classified elsewhere: Secondary | ICD-10-CM | POA: Diagnosis not present

## 2015-01-11 DIAGNOSIS — N390 Urinary tract infection, site not specified: Secondary | ICD-10-CM | POA: Diagnosis not present

## 2015-01-13 DIAGNOSIS — N3020 Other chronic cystitis without hematuria: Secondary | ICD-10-CM | POA: Diagnosis not present

## 2015-01-14 DIAGNOSIS — N3020 Other chronic cystitis without hematuria: Secondary | ICD-10-CM | POA: Diagnosis not present

## 2015-01-18 DIAGNOSIS — N390 Urinary tract infection, site not specified: Secondary | ICD-10-CM | POA: Diagnosis not present

## 2015-01-18 DIAGNOSIS — B9620 Unspecified Escherichia coli [E. coli] as the cause of diseases classified elsewhere: Secondary | ICD-10-CM | POA: Diagnosis not present

## 2015-01-19 DIAGNOSIS — N3020 Other chronic cystitis without hematuria: Secondary | ICD-10-CM | POA: Diagnosis not present

## 2015-01-20 DIAGNOSIS — N3020 Other chronic cystitis without hematuria: Secondary | ICD-10-CM | POA: Diagnosis not present

## 2015-01-21 DIAGNOSIS — F411 Generalized anxiety disorder: Secondary | ICD-10-CM | POA: Diagnosis not present

## 2015-01-26 DIAGNOSIS — N390 Urinary tract infection, site not specified: Secondary | ICD-10-CM | POA: Diagnosis not present

## 2015-01-26 DIAGNOSIS — B9620 Unspecified Escherichia coli [E. coli] as the cause of diseases classified elsewhere: Secondary | ICD-10-CM | POA: Diagnosis not present

## 2015-02-01 DIAGNOSIS — B9620 Unspecified Escherichia coli [E. coli] as the cause of diseases classified elsewhere: Secondary | ICD-10-CM | POA: Diagnosis not present

## 2015-02-01 DIAGNOSIS — N390 Urinary tract infection, site not specified: Secondary | ICD-10-CM | POA: Diagnosis not present

## 2015-02-02 DIAGNOSIS — B9620 Unspecified Escherichia coli [E. coli] as the cause of diseases classified elsewhere: Secondary | ICD-10-CM | POA: Diagnosis not present

## 2015-02-02 DIAGNOSIS — N390 Urinary tract infection, site not specified: Secondary | ICD-10-CM | POA: Diagnosis not present

## 2015-02-03 DIAGNOSIS — N390 Urinary tract infection, site not specified: Secondary | ICD-10-CM | POA: Diagnosis not present

## 2015-02-03 DIAGNOSIS — B9620 Unspecified Escherichia coli [E. coli] as the cause of diseases classified elsewhere: Secondary | ICD-10-CM | POA: Diagnosis not present

## 2015-02-04 DIAGNOSIS — N390 Urinary tract infection, site not specified: Secondary | ICD-10-CM | POA: Diagnosis not present

## 2015-02-04 DIAGNOSIS — B9620 Unspecified Escherichia coli [E. coli] as the cause of diseases classified elsewhere: Secondary | ICD-10-CM | POA: Diagnosis not present

## 2015-02-07 DIAGNOSIS — M1288 Other specific arthropathies, not elsewhere classified, other specified site: Secondary | ICD-10-CM | POA: Diagnosis not present

## 2015-02-07 DIAGNOSIS — M4806 Spinal stenosis, lumbar region: Secondary | ICD-10-CM | POA: Diagnosis not present

## 2015-02-07 DIAGNOSIS — M533 Sacrococcygeal disorders, not elsewhere classified: Secondary | ICD-10-CM | POA: Diagnosis not present

## 2015-02-07 DIAGNOSIS — N3020 Other chronic cystitis without hematuria: Secondary | ICD-10-CM | POA: Diagnosis not present

## 2015-02-07 DIAGNOSIS — M5136 Other intervertebral disc degeneration, lumbar region: Secondary | ICD-10-CM | POA: Diagnosis not present

## 2015-02-08 DIAGNOSIS — N390 Urinary tract infection, site not specified: Secondary | ICD-10-CM | POA: Diagnosis not present

## 2015-02-10 DIAGNOSIS — B9620 Unspecified Escherichia coli [E. coli] as the cause of diseases classified elsewhere: Secondary | ICD-10-CM | POA: Diagnosis not present

## 2015-02-10 DIAGNOSIS — N3020 Other chronic cystitis without hematuria: Secondary | ICD-10-CM | POA: Diagnosis not present

## 2015-02-10 DIAGNOSIS — N390 Urinary tract infection, site not specified: Secondary | ICD-10-CM | POA: Diagnosis not present

## 2015-02-22 DIAGNOSIS — N3020 Other chronic cystitis without hematuria: Secondary | ICD-10-CM | POA: Diagnosis not present

## 2015-02-22 DIAGNOSIS — N390 Urinary tract infection, site not specified: Secondary | ICD-10-CM | POA: Diagnosis not present

## 2015-03-08 DIAGNOSIS — F411 Generalized anxiety disorder: Secondary | ICD-10-CM | POA: Diagnosis not present

## 2015-03-09 DIAGNOSIS — N3010 Interstitial cystitis (chronic) without hematuria: Secondary | ICD-10-CM | POA: Diagnosis not present

## 2015-03-09 DIAGNOSIS — N3020 Other chronic cystitis without hematuria: Secondary | ICD-10-CM | POA: Diagnosis not present

## 2015-03-17 DIAGNOSIS — N390 Urinary tract infection, site not specified: Secondary | ICD-10-CM | POA: Diagnosis not present

## 2015-03-17 DIAGNOSIS — F419 Anxiety disorder, unspecified: Secondary | ICD-10-CM | POA: Diagnosis not present

## 2015-03-17 DIAGNOSIS — J309 Allergic rhinitis, unspecified: Secondary | ICD-10-CM | POA: Diagnosis not present

## 2015-03-18 DIAGNOSIS — F411 Generalized anxiety disorder: Secondary | ICD-10-CM | POA: Diagnosis not present

## 2015-03-21 ENCOUNTER — Ambulatory Visit (INDEPENDENT_AMBULATORY_CARE_PROVIDER_SITE_OTHER): Payer: Medicare Other | Admitting: Infectious Diseases

## 2015-03-21 ENCOUNTER — Encounter: Payer: Self-pay | Admitting: Infectious Diseases

## 2015-03-21 VITALS — BP 167/77 | HR 57 | Temp 98.0°F | Ht 64.0 in | Wt 205.0 lb

## 2015-03-21 DIAGNOSIS — Z23 Encounter for immunization: Secondary | ICD-10-CM

## 2015-03-21 DIAGNOSIS — K219 Gastro-esophageal reflux disease without esophagitis: Secondary | ICD-10-CM | POA: Insufficient documentation

## 2015-03-21 DIAGNOSIS — I10 Essential (primary) hypertension: Secondary | ICD-10-CM | POA: Diagnosis not present

## 2015-03-21 DIAGNOSIS — N3080 Other cystitis without hematuria: Secondary | ICD-10-CM

## 2015-03-21 DIAGNOSIS — N3090 Cystitis, unspecified without hematuria: Secondary | ICD-10-CM | POA: Insufficient documentation

## 2015-03-21 DIAGNOSIS — M797 Fibromyalgia: Secondary | ICD-10-CM | POA: Diagnosis not present

## 2015-03-21 MED ORDER — AMOXICILLIN-POT CLAVULANATE 875-125 MG PO TABS: 1.0000 | ORAL_TABLET | Freq: Two times a day (BID) | ORAL | Status: DC

## 2015-03-21 NOTE — Assessment & Plan Note (Addendum)
States she has had sx for 1 month- night sweats, dysuria, cloudy urine.  4 keys to her treatment- 1) judicious use of anbx- only when she has sx.  Would rec augmentin, nitro or fosfomycin.  2) don't check UCx unless she has acute sx. Do not check for clearance.  3) hydraulic distension (per pt) 4) Hydration 5) good catheter hygeine  She is given flu shot today.  10 days of augmentin today.  Will see her back in 2-3 weeks.   We spoke at length (> 30 minutes) regarding her health issues.

## 2015-03-21 NOTE — Assessment & Plan Note (Signed)
Will f/u with her PCP.  

## 2015-03-21 NOTE — Progress Notes (Signed)
   Subjective:    Patient ID: Jennifer ReamerBarbara E Deandrade, female    DOB: [birth year], 69 y.o.   MRN: [id number]  HPI 69 yo F with hx fibromyalgia, gerd, and of cystoplasty 1984 at [facility]. She had 12 months of bladder infection afterwards. 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 She has lost her home due to keeping her parents in SNF (they have both since died). She self cath's and has had difficulty keeping with boiling her cath's.  Has had multiple UTI with E coli as well as P mirabilis. She was found on CT this fall to have small kidney stone.  She states her allergy list is not accurate. Would like to be rechallenged with augmentin.  In the past 2 years has been on macrobid, kelfex (abd rash/fungus), IV gent (01-2015), augmentin.  Has had occas episodes of abd/bladder pain and frequency. Has been off anbx now for 4 weeks. Has been on cranberry extract.  UCx (03-14-15) E coli- ESBL, (S- augmentin, zosyn, imipenem, gent/tobra, nitro).   Is on anti-htn rx through PCP (bistolic) Has not had flu shot. Shingles vax.  Has been getting spinal injections for her pinched nerves.   Review of Systems  Constitutional: Positive for fever. Negative for chills, appetite change and unexpected weight change.  Gastrointestinal: Positive for abdominal pain.  Genitourinary: Positive for dysuria, flank pain and difficulty urinating.   Please see HPI. 12 point ROS o/w (-)     Objective:   Physical Exam  Constitutional: She appears well-developed and well-nourished.  HENT:  Mouth/Throat: No oropharyngeal exudate.  Eyes: EOM are normal. Pupils are equal, round, and reactive to light.  Neck: Neck supple.  Cardiovascular: Normal rate and regular rhythm.   Pulmonary/Chest: Effort normal and breath sounds normal.  Abdominal: Soft. Bowel sounds are normal. There is no tenderness. There is no rebound and no CVA tenderness.  Musculoskeletal: She exhibits no edema.    Lymphadenopathy:    She has no cervical adenopathy.      Assessment & Plan:

## 2015-03-23 DIAGNOSIS — F411 Generalized anxiety disorder: Secondary | ICD-10-CM | POA: Diagnosis not present

## 2015-03-29 DIAGNOSIS — F411 Generalized anxiety disorder: Secondary | ICD-10-CM | POA: Diagnosis not present

## 2015-03-30 DIAGNOSIS — M5136 Other intervertebral disc degeneration, lumbar region: Secondary | ICD-10-CM | POA: Diagnosis not present

## 2015-03-30 DIAGNOSIS — M4806 Spinal stenosis, lumbar region: Secondary | ICD-10-CM | POA: Diagnosis not present

## 2015-03-30 DIAGNOSIS — M1288 Other specific arthropathies, not elsewhere classified, other specified site: Secondary | ICD-10-CM | POA: Diagnosis not present

## 2015-03-30 DIAGNOSIS — M533 Sacrococcygeal disorders, not elsewhere classified: Secondary | ICD-10-CM | POA: Diagnosis not present

## 2015-04-11 ENCOUNTER — Ambulatory Visit: Payer: Medicare Other | Admitting: Infectious Diseases

## 2015-04-19 ENCOUNTER — Encounter: Payer: Self-pay | Admitting: Infectious Diseases

## 2015-04-19 ENCOUNTER — Ambulatory Visit (INDEPENDENT_AMBULATORY_CARE_PROVIDER_SITE_OTHER): Payer: Medicare Other | Admitting: Infectious Diseases

## 2015-04-19 VITALS — BP 175/91 | HR 59 | Temp 98.5°F | Wt 204.0 lb

## 2015-04-19 DIAGNOSIS — N3080 Other cystitis without hematuria: Secondary | ICD-10-CM | POA: Diagnosis not present

## 2015-04-19 DIAGNOSIS — K589 Irritable bowel syndrome without diarrhea: Secondary | ICD-10-CM

## 2015-04-19 DIAGNOSIS — I10 Essential (primary) hypertension: Secondary | ICD-10-CM

## 2015-04-19 DIAGNOSIS — N3090 Cystitis, unspecified without hematuria: Secondary | ICD-10-CM

## 2015-04-19 NOTE — Assessment & Plan Note (Signed)
Have asked her to f/u with prev GI at [facility]. Dr Matthias Hughs

## 2015-04-19 NOTE — Assessment & Plan Note (Addendum)
Encouraged her to stay off anbx.  She will call if she has worsening sx- f/c, dysuria, flank pain, abd pain. Has been having night sweats due to comforter.  Will not test her for clearance.  Will f/u with urolgy for hydraulic distension in next month.  Will see her back prn.

## 2015-04-19 NOTE — Assessment & Plan Note (Addendum)
Needs to f/u with her PCP.  Has been variable- down to 130s.  Needs to be checking BID.  Has been having headaches and sleeping poorly, been weaning off xanax. Now on trazadone. To start cymbalta soon.

## 2015-04-19 NOTE — Progress Notes (Signed)
   Subjective:    Patient ID: Jennifer [name], female    DOB: [birth year], 70 y.o.   MRN: [id number]  HPI 70 yo F with hx fibromyalgia, gerd, possible IBS, and of cystoplasty 1984 at [facility]. She had 12 months of bladder infection afterwards. 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 She has lost her home due to keeping her parents in SNF (they have both since died). She self cath's and has had difficulty keeping with boiling her cath's.  Has had multiple UTI with E coli as well as P mirabilis. She was found on CT this fall to have small kidney stone.  She states her allergy list is not accurate. Would like to be rechallenged with augmentin.  In the past 2 years has been on macrobid, kelfex (abd rash/fungus), IV gent (01-2015), augmentin.  Has had occas episodes of abd/bladder pain and frequency. Has been off anbx now for 4 weeks. Has been on cranberry extract.  UCx (03-14-15) E coli- ESBL, (S- augmentin, zosyn, imipenem, gent/tobra, nitro).  She was seen in ID 03-2015 and was given prn rx for augmentin as well as education on UTI and colonization.  Still has some occas burning, freq, odor. Relates this to her stress level (frequency). Possible low grade fevers.  Had some difficulty with augmentin- had constipation, diarrhea. Also had some episodes of post-prandial dumping. Has had episodes of this previously and ? Dx with IBS.  Has been drinking more fluids.   Review of Systems  Constitutional: Negative for fever and chills.  Respiratory: Negative for shortness of breath.   Cardiovascular: Negative for chest pain.  Gastrointestinal: Positive for diarrhea and constipation.  Genitourinary: Positive for urgency, frequency and difficulty urinating.  Neurological: Positive for headaches.  Please see HPI. 12 point ROS o/w (-)      Objective:   Physical Exam  Constitutional: She appears well-developed and well-nourished.  HENT:    Mouth/Throat: No oropharyngeal exudate.  Eyes: EOM are normal. Pupils are equal, round, and reactive to light.  Neck: Neck supple.  Cardiovascular: Normal rate, regular rhythm and normal heart sounds.   Pulmonary/Chest: Effort normal and breath sounds normal.  Abdominal: Soft. Bowel sounds are normal. There is no tenderness. There is no rebound.  Musculoskeletal: She exhibits no edema.  Lymphadenopathy:    She has no cervical adenopathy.       Assessment & Plan:

## 2015-04-21 DIAGNOSIS — F411 Generalized anxiety disorder: Secondary | ICD-10-CM | POA: Diagnosis not present

## 2015-04-27 ENCOUNTER — Telehealth: Payer: Self-pay | Admitting: *Deleted

## 2015-04-27 ENCOUNTER — Other Ambulatory Visit: Payer: Self-pay | Admitting: Infectious Diseases

## 2015-04-27 DIAGNOSIS — N390 Urinary tract infection, site not specified: Secondary | ICD-10-CM

## 2015-04-27 DIAGNOSIS — Z5181 Encounter for therapeutic drug level monitoring: Secondary | ICD-10-CM | POA: Diagnosis not present

## 2015-04-27 DIAGNOSIS — M5136 Other intervertebral disc degeneration, lumbar region: Secondary | ICD-10-CM | POA: Diagnosis not present

## 2015-04-27 DIAGNOSIS — M4806 Spinal stenosis, lumbar region: Secondary | ICD-10-CM | POA: Diagnosis not present

## 2015-04-27 DIAGNOSIS — M533 Sacrococcygeal disorders, not elsewhere classified: Secondary | ICD-10-CM | POA: Diagnosis not present

## 2015-04-27 DIAGNOSIS — M1288 Other specific arthropathies, not elsewhere classified, other specified site: Secondary | ICD-10-CM | POA: Diagnosis not present

## 2015-04-27 DIAGNOSIS — N3090 Cystitis, unspecified without hematuria: Secondary | ICD-10-CM

## 2015-04-27 MED ORDER — FOSFOMYCIN TROMETHAMINE 3 G PO PACK: PACK | ORAL | Status: DC

## 2015-04-27 MED ORDER — NITROFURANTOIN MONOHYD MACRO 100 MG PO CAPS: 100.0000 mg | ORAL_CAPSULE | Freq: Two times a day (BID) | ORAL | Status: DC

## 2015-04-27 NOTE — Telephone Encounter (Signed)
Return of UTI symptoms, increased frequency, pain, odor and burning.  Pt was instructed by Dr. Ninetta Lights to call if symptoms worsened.  MD please advise.

## 2015-04-27 NOTE — Telephone Encounter (Signed)
Unable to contact pt by phone 

## 2015-04-27 NOTE — Telephone Encounter (Signed)
Spoke with pharmacy re: pt can take Imodium for diarrhea if this occurs w/ new RX per dr. Ninetta Lights.

## 2015-04-27 NOTE — Addendum Note (Signed)
Addended by: Jennet Maduro D on: 04/27/2015 03:27 PM   Modules accepted: Orders, Medications

## 2015-04-27 NOTE — Telephone Encounter (Signed)
Please give her fosfomycin 3g po x 1 1 refill

## 2015-04-27 NOTE — Telephone Encounter (Signed)
Pt's insurance will not cover fosfomycin.  Will give her nitrofurantoin.  Her allergy lists this as causing diarrhea  Will have RN ask her if she would like to try this.

## 2015-04-28 ENCOUNTER — Other Ambulatory Visit: Payer: Self-pay | Admitting: Infectious Diseases

## 2015-04-28 DIAGNOSIS — F411 Generalized anxiety disorder: Secondary | ICD-10-CM | POA: Diagnosis not present

## 2015-04-28 DIAGNOSIS — N390 Urinary tract infection, site not specified: Secondary | ICD-10-CM

## 2015-04-28 DIAGNOSIS — T83511D Infection and inflammatory reaction due to indwelling urethral catheter, subsequent encounter: Principal | ICD-10-CM

## 2015-04-28 NOTE — Telephone Encounter (Signed)
Please have her come in for UCx

## 2015-04-28 NOTE — Telephone Encounter (Addendum)
Patient would like to know if having a urine culture would be advisable before she starts the macrobid.  She states she would feel more comfortable knowing those results before starting the antibiotic.  (Additionally, patient has found patient assistance and is now able to use a clean catheter each time, no longer has to boil her caths for reuse.) Please advise.  Patient will be at her hotel room until 11:30 [phone]) or will call back 1/27 am. Andree Coss, RN

## 2015-04-29 ENCOUNTER — Telehealth: Payer: Self-pay | Admitting: *Deleted

## 2015-04-29 NOTE — Telephone Encounter (Signed)
Patient forgot our clinic closed at 12 Friday, will wait until 1/31 for the urine culture.  She will stay off antibiotics if possible.  She has been given samples for Urobel and analgesic blue by her urologist. Andree Coss, RN

## 2015-05-03 ENCOUNTER — Ambulatory Visit (INDEPENDENT_AMBULATORY_CARE_PROVIDER_SITE_OTHER): Payer: Medicare Other | Admitting: Infectious Diseases

## 2015-05-03 ENCOUNTER — Encounter: Payer: Self-pay | Admitting: Infectious Diseases

## 2015-05-03 VITALS — BP 185/93 | HR 62 | Temp 98.3°F | Wt 197.0 lb

## 2015-05-03 DIAGNOSIS — N3090 Cystitis, unspecified without hematuria: Secondary | ICD-10-CM | POA: Diagnosis not present

## 2015-05-03 DIAGNOSIS — I10 Essential (primary) hypertension: Secondary | ICD-10-CM | POA: Diagnosis present

## 2015-05-03 DIAGNOSIS — N3080 Other cystitis without hematuria: Secondary | ICD-10-CM | POA: Diagnosis not present

## 2015-05-03 DIAGNOSIS — F411 Generalized anxiety disorder: Secondary | ICD-10-CM | POA: Diagnosis not present

## 2015-05-03 MED ORDER — FLUCONAZOLE 100 MG PO TABS: 100.0000 mg | ORAL_TABLET | Freq: Every day | ORAL | Status: DC

## 2015-05-03 NOTE — Assessment & Plan Note (Signed)
Will recheck her UCx at her request.  Will Ask her to start macrobid as soon as she has Cx.  She will try to get fosfomyicn at drug store again.  Will see her back in 1-2 months

## 2015-05-03 NOTE — Progress Notes (Signed)
   Subjective:    Patient ID: Jennifer [name], female    DOB: 28-Nov-1945, 70 y.o.   MRN: [id number]  HPI 70 yo F with hx fibromyalgia, gerd, possible IBS, and of cystoplasty 1984 at [facility]. She had 12 months of bladder infection afterwards. 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 She has lost her home due to keeping her parents in SNF (they have both since died). She self cath's and has had difficulty keeping with boiling her cath's.  Has had multiple UTI with E coli as well as P mirabilis. She was found on CT this fall to have small kidney stone.  She states her allergy list is not accurate. Would like to be rechallenged with augmentin.  In the past 2 years has been on macrobid, kelfex (abd rash/fungus), IV gent (01-2015), augmentin.  Has had occas episodes of abd/bladder pain and frequency. Has been off anbx now for 4 weeks. Has been on cranberry extract.  UCx (03-14-15) E coli- ESBL, (S- augmentin, zosyn, imipenem, gent/tobra, nitro).  She was seen in ID 03-2015 and was given prn rx for augmentin as well as education on UTI and colonization.  Still has some occas burning, freq, odor. Relates this to her stress level (frequency). Possible low grade fevers.  Had some difficulty with augmentin- had constipation, diarrhea. Also had some episodes of post-prandial dumping. Has had episodes of this previously and ? Dx with IBS.  Has f/u appt with her PCP next week to f/u with her HTN. She attributes this to lack of sleep, sciatica/bursitis in R leg, narcotic use. She describes her pain in her leg as 10/10.  Last week she had worsening urine sx- pain, slight burning, frequency, foul odor.  She had fosfomycin rx but her insurance would not cover this. She was given macrobid rx instead but has not started. She is here for urine Cx.   Review of Systems  Constitutional: Negative for fever and chills.  Genitourinary: Positive for dysuria, urgency,  frequency and difficulty urinating.       Objective:   Physical Exam  Constitutional: She appears well-developed and well-nourished.  HENT:  Mouth/Throat: No oropharyngeal exudate.  Eyes: EOM are normal. Pupils are equal, round, and reactive to light.  Neck: Neck supple.  Cardiovascular: Normal rate, regular rhythm and normal heart sounds.   Pulmonary/Chest: Effort normal and breath sounds normal.  Abdominal: Soft. Bowel sounds are normal.  Mild suprapubic pain, mild flank pain   Musculoskeletal: She exhibits no edema.  Lymphadenopathy:    She has no cervical adenopathy.      Assessment & Plan:

## 2015-05-03 NOTE — Assessment & Plan Note (Signed)
She will f/u with her pcp next week.  Her repeat BP is 156/93

## 2015-05-05 LAB — URINALYSIS, ROUTINE W REFLEX MICROSCOPIC
Bilirubin Urine: NEGATIVE
Glucose, UA: NEGATIVE
Hgb urine dipstick: NEGATIVE
Ketones, ur: NEGATIVE
Nitrite: NEGATIVE
Specific Gravity, Urine: 1.023 (ref 1.001–1.035)
pH: 5 (ref 5.0–8.0)

## 2015-05-05 LAB — URINALYSIS, MICROSCOPIC ONLY
Casts: NONE SEEN [LPF]
Yeast: NONE SEEN [HPF]

## 2015-05-06 ENCOUNTER — Telehealth: Payer: Self-pay | Admitting: *Deleted

## 2015-05-06 DIAGNOSIS — M9983 Other biomechanical lesions of lumbar region: Secondary | ICD-10-CM | POA: Diagnosis not present

## 2015-05-06 DIAGNOSIS — K219 Gastro-esophageal reflux disease without esophagitis: Secondary | ICD-10-CM | POA: Diagnosis not present

## 2015-05-06 DIAGNOSIS — I10 Essential (primary) hypertension: Secondary | ICD-10-CM | POA: Diagnosis not present

## 2015-05-06 DIAGNOSIS — N390 Urinary tract infection, site not specified: Secondary | ICD-10-CM | POA: Diagnosis not present

## 2015-05-06 LAB — URINE CULTURE: Colony Count: >=100000

## 2015-05-06 NOTE — Telephone Encounter (Signed)
Patient called for urine culture results; still in preliminary, she will call back on Monday. She has started the Macrobid. Jennifer [name]

## 2015-05-10 ENCOUNTER — Telehealth: Payer: Self-pay | Admitting: *Deleted

## 2015-05-10 NOTE — Telephone Encounter (Signed)
Office note faxed to Dr. Catha Gosselin at [phone], confirmation received. Wendall Mola

## 2015-05-13 DIAGNOSIS — F411 Generalized anxiety disorder: Secondary | ICD-10-CM | POA: Diagnosis not present

## 2015-05-18 DIAGNOSIS — F411 Generalized anxiety disorder: Secondary | ICD-10-CM | POA: Diagnosis not present

## 2015-05-27 DIAGNOSIS — F411 Generalized anxiety disorder: Secondary | ICD-10-CM | POA: Diagnosis not present

## 2015-06-01 DIAGNOSIS — J320 Chronic maxillary sinusitis: Secondary | ICD-10-CM | POA: Diagnosis not present

## 2015-06-01 DIAGNOSIS — J040 Acute laryngitis: Secondary | ICD-10-CM | POA: Diagnosis not present

## 2015-06-01 DIAGNOSIS — J322 Chronic ethmoidal sinusitis: Secondary | ICD-10-CM | POA: Diagnosis not present

## 2015-06-10 DIAGNOSIS — S0012XA Contusion of left eyelid and periocular area, initial encounter: Secondary | ICD-10-CM | POA: Diagnosis not present

## 2015-06-10 DIAGNOSIS — R42 Dizziness and giddiness: Secondary | ICD-10-CM | POA: Diagnosis not present

## 2015-06-15 DIAGNOSIS — M4806 Spinal stenosis, lumbar region: Secondary | ICD-10-CM | POA: Diagnosis not present

## 2015-06-15 DIAGNOSIS — M533 Sacrococcygeal disorders, not elsewhere classified: Secondary | ICD-10-CM | POA: Diagnosis not present

## 2015-06-15 DIAGNOSIS — M1288 Other specific arthropathies, not elsewhere classified, other specified site: Secondary | ICD-10-CM | POA: Diagnosis not present

## 2015-06-15 DIAGNOSIS — M5136 Other intervertebral disc degeneration, lumbar region: Secondary | ICD-10-CM | POA: Diagnosis not present

## 2015-06-16 DIAGNOSIS — H9313 Tinnitus, bilateral: Secondary | ICD-10-CM | POA: Diagnosis not present

## 2015-06-16 DIAGNOSIS — H8143 Vertigo of central origin, bilateral: Secondary | ICD-10-CM | POA: Diagnosis not present

## 2015-06-16 DIAGNOSIS — J320 Chronic maxillary sinusitis: Secondary | ICD-10-CM | POA: Diagnosis not present

## 2015-06-16 DIAGNOSIS — J322 Chronic ethmoidal sinusitis: Secondary | ICD-10-CM | POA: Diagnosis not present

## 2015-06-17 ENCOUNTER — Other Ambulatory Visit: Payer: Self-pay | Admitting: Family Medicine

## 2015-06-17 DIAGNOSIS — R519 Headache, unspecified: Secondary | ICD-10-CM

## 2015-06-17 DIAGNOSIS — R42 Dizziness and giddiness: Secondary | ICD-10-CM | POA: Diagnosis not present

## 2015-06-17 DIAGNOSIS — R51 Headache: Principal | ICD-10-CM

## 2015-06-21 ENCOUNTER — Other Ambulatory Visit: Payer: Medicare Other

## 2015-06-22 DIAGNOSIS — N3010 Interstitial cystitis (chronic) without hematuria: Secondary | ICD-10-CM | POA: Diagnosis not present

## 2015-06-22 DIAGNOSIS — N952 Postmenopausal atrophic vaginitis: Secondary | ICD-10-CM | POA: Diagnosis not present

## 2015-06-22 DIAGNOSIS — Z0000 Encounter for general adult medical examination without abnormal findings: Secondary | ICD-10-CM | POA: Diagnosis not present

## 2015-06-22 DIAGNOSIS — N3020 Other chronic cystitis without hematuria: Secondary | ICD-10-CM | POA: Diagnosis not present

## 2015-06-23 ENCOUNTER — Ambulatory Visit
Admission: RE | Admit: 2015-06-23 | Discharge: 2015-06-23 | Disposition: A | Discharge status: Home / Self Care | Payer: Medicare Other | Source: Ambulatory Visit | Attending: Family Medicine | Admitting: Family Medicine

## 2015-06-23 DIAGNOSIS — R51 Headache: Secondary | ICD-10-CM | POA: Diagnosis not present

## 2015-06-23 DIAGNOSIS — R519 Headache, unspecified: Secondary | ICD-10-CM

## 2015-06-24 DIAGNOSIS — F411 Generalized anxiety disorder: Secondary | ICD-10-CM | POA: Diagnosis not present

## 2015-06-30 DIAGNOSIS — I10 Essential (primary) hypertension: Secondary | ICD-10-CM | POA: Diagnosis not present

## 2015-06-30 DIAGNOSIS — R42 Dizziness and giddiness: Secondary | ICD-10-CM | POA: Diagnosis not present

## 2015-07-01 DIAGNOSIS — F411 Generalized anxiety disorder: Secondary | ICD-10-CM | POA: Diagnosis not present

## 2015-07-04 ENCOUNTER — Ambulatory Visit: Payer: Medicare Other | Admitting: Infectious Diseases

## 2015-07-06 DIAGNOSIS — F411 Generalized anxiety disorder: Secondary | ICD-10-CM | POA: Diagnosis not present

## 2015-07-29 DIAGNOSIS — F411 Generalized anxiety disorder: Secondary | ICD-10-CM | POA: Diagnosis not present

## 2015-07-29 DIAGNOSIS — M4806 Spinal stenosis, lumbar region: Secondary | ICD-10-CM | POA: Diagnosis not present

## 2015-07-29 DIAGNOSIS — M533 Sacrococcygeal disorders, not elsewhere classified: Secondary | ICD-10-CM | POA: Diagnosis not present

## 2015-07-29 DIAGNOSIS — M5136 Other intervertebral disc degeneration, lumbar region: Secondary | ICD-10-CM | POA: Diagnosis not present

## 2015-08-03 DIAGNOSIS — B9620 Unspecified Escherichia coli [E. coli] as the cause of diseases classified elsewhere: Secondary | ICD-10-CM | POA: Diagnosis not present

## 2015-08-03 DIAGNOSIS — Z0000 Encounter for general adult medical examination without abnormal findings: Secondary | ICD-10-CM | POA: Diagnosis not present

## 2015-08-03 DIAGNOSIS — N390 Urinary tract infection, site not specified: Secondary | ICD-10-CM | POA: Diagnosis not present

## 2015-08-05 DIAGNOSIS — F411 Generalized anxiety disorder: Secondary | ICD-10-CM | POA: Diagnosis not present

## 2015-08-08 DIAGNOSIS — N390 Urinary tract infection, site not specified: Secondary | ICD-10-CM | POA: Diagnosis not present

## 2015-08-08 DIAGNOSIS — B9620 Unspecified Escherichia coli [E. coli] as the cause of diseases classified elsewhere: Secondary | ICD-10-CM | POA: Diagnosis not present

## 2015-08-08 DIAGNOSIS — F411 Generalized anxiety disorder: Secondary | ICD-10-CM | POA: Diagnosis not present

## 2015-08-09 DIAGNOSIS — N390 Urinary tract infection, site not specified: Secondary | ICD-10-CM | POA: Diagnosis not present

## 2015-08-09 DIAGNOSIS — B9620 Unspecified Escherichia coli [E. coli] as the cause of diseases classified elsewhere: Secondary | ICD-10-CM | POA: Diagnosis not present

## 2015-08-10 DIAGNOSIS — N390 Urinary tract infection, site not specified: Secondary | ICD-10-CM | POA: Diagnosis not present

## 2015-08-10 DIAGNOSIS — B9620 Unspecified Escherichia coli [E. coli] as the cause of diseases classified elsewhere: Secondary | ICD-10-CM | POA: Diagnosis not present

## 2015-08-10 DIAGNOSIS — F411 Generalized anxiety disorder: Secondary | ICD-10-CM | POA: Diagnosis not present

## 2015-08-12 DIAGNOSIS — B9620 Unspecified Escherichia coli [E. coli] as the cause of diseases classified elsewhere: Secondary | ICD-10-CM | POA: Diagnosis not present

## 2015-08-12 DIAGNOSIS — N390 Urinary tract infection, site not specified: Secondary | ICD-10-CM | POA: Diagnosis not present

## 2015-08-12 DIAGNOSIS — Z0000 Encounter for general adult medical examination without abnormal findings: Secondary | ICD-10-CM | POA: Diagnosis not present

## 2015-08-18 DIAGNOSIS — N3000 Acute cystitis without hematuria: Secondary | ICD-10-CM | POA: Diagnosis not present

## 2015-08-18 DIAGNOSIS — R350 Frequency of micturition: Secondary | ICD-10-CM | POA: Diagnosis not present

## 2015-08-18 DIAGNOSIS — Z0000 Encounter for general adult medical examination without abnormal findings: Secondary | ICD-10-CM | POA: Diagnosis not present

## 2015-08-23 ENCOUNTER — Ambulatory Visit: Payer: Medicare Other | Admitting: Infectious Diseases

## 2015-08-25 DIAGNOSIS — M9983 Other biomechanical lesions of lumbar region: Secondary | ICD-10-CM | POA: Diagnosis not present

## 2015-08-25 DIAGNOSIS — K219 Gastro-esophageal reflux disease without esophagitis: Secondary | ICD-10-CM | POA: Diagnosis not present

## 2015-08-25 DIAGNOSIS — F411 Generalized anxiety disorder: Secondary | ICD-10-CM | POA: Diagnosis not present

## 2015-08-25 DIAGNOSIS — I10 Essential (primary) hypertension: Secondary | ICD-10-CM | POA: Diagnosis not present

## 2015-08-25 DIAGNOSIS — N390 Urinary tract infection, site not specified: Secondary | ICD-10-CM | POA: Diagnosis not present

## 2015-08-25 DIAGNOSIS — E559 Vitamin D deficiency, unspecified: Secondary | ICD-10-CM | POA: Diagnosis not present

## 2015-08-31 DIAGNOSIS — F411 Generalized anxiety disorder: Secondary | ICD-10-CM | POA: Diagnosis not present

## 2015-09-01 DIAGNOSIS — N3020 Other chronic cystitis without hematuria: Secondary | ICD-10-CM | POA: Diagnosis not present

## 2015-09-01 DIAGNOSIS — R350 Frequency of micturition: Secondary | ICD-10-CM | POA: Diagnosis not present

## 2015-09-01 DIAGNOSIS — N3010 Interstitial cystitis (chronic) without hematuria: Secondary | ICD-10-CM | POA: Diagnosis not present

## 2015-09-08 DIAGNOSIS — M533 Sacrococcygeal disorders, not elsewhere classified: Secondary | ICD-10-CM | POA: Diagnosis not present

## 2015-09-08 DIAGNOSIS — N3000 Acute cystitis without hematuria: Secondary | ICD-10-CM | POA: Diagnosis not present

## 2015-09-08 DIAGNOSIS — F411 Generalized anxiety disorder: Secondary | ICD-10-CM | POA: Diagnosis not present

## 2015-09-12 ENCOUNTER — Ambulatory Visit: Payer: Medicare Other | Admitting: Infectious Diseases

## 2015-09-12 DIAGNOSIS — N3020 Other chronic cystitis without hematuria: Secondary | ICD-10-CM | POA: Diagnosis not present

## 2015-09-13 DIAGNOSIS — N3000 Acute cystitis without hematuria: Secondary | ICD-10-CM | POA: Diagnosis not present

## 2015-09-13 DIAGNOSIS — I10 Essential (primary) hypertension: Secondary | ICD-10-CM | POA: Diagnosis not present

## 2015-09-13 DIAGNOSIS — Z79899 Other long term (current) drug therapy: Secondary | ICD-10-CM | POA: Diagnosis not present

## 2015-09-13 DIAGNOSIS — E559 Vitamin D deficiency, unspecified: Secondary | ICD-10-CM | POA: Diagnosis not present

## 2015-09-14 DIAGNOSIS — N3020 Other chronic cystitis without hematuria: Secondary | ICD-10-CM | POA: Diagnosis not present

## 2015-09-16 ENCOUNTER — Other Ambulatory Visit: Payer: Self-pay | Admitting: Urology

## 2015-09-19 DIAGNOSIS — N3020 Other chronic cystitis without hematuria: Secondary | ICD-10-CM | POA: Diagnosis not present

## 2015-09-20 DIAGNOSIS — N3000 Acute cystitis without hematuria: Secondary | ICD-10-CM | POA: Diagnosis not present

## 2015-09-22 DIAGNOSIS — F411 Generalized anxiety disorder: Secondary | ICD-10-CM | POA: Diagnosis not present

## 2015-09-22 DIAGNOSIS — N3020 Other chronic cystitis without hematuria: Secondary | ICD-10-CM | POA: Diagnosis not present

## 2015-09-23 DIAGNOSIS — N3020 Other chronic cystitis without hematuria: Secondary | ICD-10-CM | POA: Diagnosis not present

## 2015-09-26 DIAGNOSIS — N3020 Other chronic cystitis without hematuria: Secondary | ICD-10-CM | POA: Diagnosis not present

## 2015-09-27 DIAGNOSIS — M533 Sacrococcygeal disorders, not elsewhere classified: Secondary | ICD-10-CM | POA: Diagnosis not present

## 2015-09-30 DIAGNOSIS — F411 Generalized anxiety disorder: Secondary | ICD-10-CM | POA: Diagnosis not present

## 2015-10-10 ENCOUNTER — Other Ambulatory Visit: Payer: Self-pay | Admitting: Urology

## 2015-10-12 DIAGNOSIS — F411 Generalized anxiety disorder: Secondary | ICD-10-CM | POA: Diagnosis not present

## 2015-10-19 DIAGNOSIS — N3010 Interstitial cystitis (chronic) without hematuria: Secondary | ICD-10-CM | POA: Diagnosis not present

## 2015-10-25 DIAGNOSIS — F411 Generalized anxiety disorder: Secondary | ICD-10-CM | POA: Diagnosis not present

## 2015-10-27 DIAGNOSIS — M1288 Other specific arthropathies, not elsewhere classified, other specified site: Secondary | ICD-10-CM | POA: Diagnosis not present

## 2015-10-27 DIAGNOSIS — M4806 Spinal stenosis, lumbar region: Secondary | ICD-10-CM | POA: Diagnosis not present

## 2015-10-27 DIAGNOSIS — M25562 Pain in left knee: Secondary | ICD-10-CM | POA: Diagnosis not present

## 2015-10-27 DIAGNOSIS — M25561 Pain in right knee: Secondary | ICD-10-CM | POA: Diagnosis not present

## 2015-10-28 DIAGNOSIS — N3010 Interstitial cystitis (chronic) without hematuria: Secondary | ICD-10-CM | POA: Diagnosis not present

## 2015-11-04 DIAGNOSIS — M25562 Pain in left knee: Secondary | ICD-10-CM | POA: Diagnosis not present

## 2015-11-04 DIAGNOSIS — N3000 Acute cystitis without hematuria: Secondary | ICD-10-CM | POA: Diagnosis not present

## 2015-11-04 DIAGNOSIS — M25561 Pain in right knee: Secondary | ICD-10-CM | POA: Diagnosis not present

## 2015-11-09 DIAGNOSIS — F411 Generalized anxiety disorder: Secondary | ICD-10-CM | POA: Diagnosis not present

## 2015-11-10 ENCOUNTER — Encounter (HOSPITAL_BASED_OUTPATIENT_CLINIC_OR_DEPARTMENT_OTHER): Payer: Self-pay | Admitting: *Deleted

## 2015-11-11 ENCOUNTER — Encounter (HOSPITAL_BASED_OUTPATIENT_CLINIC_OR_DEPARTMENT_OTHER): Payer: Self-pay | Admitting: *Deleted

## 2015-11-11 DIAGNOSIS — F411 Generalized anxiety disorder: Secondary | ICD-10-CM | POA: Diagnosis not present

## 2015-11-11 DIAGNOSIS — N3010 Interstitial cystitis (chronic) without hematuria: Secondary | ICD-10-CM | POA: Diagnosis not present

## 2015-11-14 ENCOUNTER — Ambulatory Visit: Payer: Medicare Other | Admitting: Infectious Diseases

## 2015-11-14 DIAGNOSIS — J310 Chronic rhinitis: Secondary | ICD-10-CM | POA: Diagnosis not present

## 2015-11-14 DIAGNOSIS — J018 Other acute sinusitis: Secondary | ICD-10-CM | POA: Diagnosis not present

## 2015-11-17 DIAGNOSIS — F411 Generalized anxiety disorder: Secondary | ICD-10-CM | POA: Diagnosis not present

## 2015-11-18 DIAGNOSIS — N3020 Other chronic cystitis without hematuria: Secondary | ICD-10-CM | POA: Diagnosis not present

## 2015-11-18 DIAGNOSIS — N3010 Interstitial cystitis (chronic) without hematuria: Secondary | ICD-10-CM | POA: Diagnosis not present

## 2015-11-18 DIAGNOSIS — R350 Frequency of micturition: Secondary | ICD-10-CM | POA: Diagnosis not present

## 2015-11-18 DIAGNOSIS — M797 Fibromyalgia: Secondary | ICD-10-CM | POA: Diagnosis not present

## 2015-11-18 DIAGNOSIS — F411 Generalized anxiety disorder: Secondary | ICD-10-CM | POA: Diagnosis not present

## 2015-11-18 DIAGNOSIS — R300 Dysuria: Secondary | ICD-10-CM | POA: Diagnosis not present

## 2015-11-23 DIAGNOSIS — F411 Generalized anxiety disorder: Secondary | ICD-10-CM | POA: Diagnosis not present

## 2015-11-24 DIAGNOSIS — F411 Generalized anxiety disorder: Secondary | ICD-10-CM | POA: Diagnosis not present

## 2015-11-25 ENCOUNTER — Encounter: Payer: Self-pay | Admitting: Infectious Diseases

## 2015-11-25 ENCOUNTER — Ambulatory Visit (INDEPENDENT_AMBULATORY_CARE_PROVIDER_SITE_OTHER): Payer: Medicare Other | Admitting: Infectious Diseases

## 2015-11-25 DIAGNOSIS — N3090 Cystitis, unspecified without hematuria: Secondary | ICD-10-CM

## 2015-11-25 DIAGNOSIS — N3080 Other cystitis without hematuria: Secondary | ICD-10-CM

## 2015-11-25 DIAGNOSIS — I10 Essential (primary) hypertension: Secondary | ICD-10-CM

## 2015-11-25 NOTE — Progress Notes (Signed)
   Subjective:    Patient ID: Jennifer ReamerBarbara E Tapanes, female    DOB: 09/02/1945, 70 y.o.   MRN: [id number]  HPI 70 yo F with hx fibromyalgia, gerd, possible IBS, and of cyst[facility] at [facility]. She had 12 months of bladder infection afterwards. 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 She has lost her home due to keeping her parents in SNF (they have both since died). She self cath's and has had difficulty keeping with boiling her cath's.  Has had multiple UTI with E coli as well as P mirabilis. She was found on CT this fall to have small kidney stone.   In the past 2 years has been on macrobid, kelfex (abd rash/fungus), IV gent (01-2015), augmentin.  Has had occas episodes of abd/bladder pain and frequency. Has been off anbx now for 4 weeks. Has been on cranberry extract.  UCx (03-14-15) E coli- ESBL, (S- augmentin, zosyn, imipenem, gent/tobra, nitro).  She was seen in ID 03-2015 and was given prn rx for augmentin as well as education on UTI and colonization.   States at her f/u appts she has had normal BP.   She had fosfomycin rx but her insurance would not cover this. She was given macrobid rx instead.   She states she has been bladder infection free for several weeks. She had intravesical gent a few weeks ago, she has further bladder distension scheduled with Uro.  Has been seen by ENT for repeat sinus infections- is currently on augmentin.  She has had vertigo prior.   She now has stable housing (apartment after being in motel for many months). Has 3rd floor walk-up, no issues.    Review of Systems  Constitutional: Negative for chills and fever.  Gastrointestinal: Negative for constipation and diarrhea.  continued urinary hesitancy. Still self caths/      Objective:   Physical Exam  Constitutional: She appears well-developed and well-nourished.  HENT:  Mouth/Throat: No oropharyngeal exudate.  Eyes: EOM are normal. Pupils are  equal, round, and reactive to light.  Cardiovascular: Normal rate, regular rhythm and normal heart sounds.   Pulmonary/Chest: Breath sounds normal.  Abdominal: Soft. Bowel sounds are normal. There is no tenderness. There is no rebound.       Assessment & Plan:

## 2015-11-25 NOTE — Assessment & Plan Note (Addendum)
She is doing very well after courses of augmentin and bladder washings with gent.  I have removed augmentin from her allergy list.  She asks for Hep C screening, will stop by lab next week for this.  She will return to clinic prn.

## 2015-11-25 NOTE — Assessment & Plan Note (Signed)
I rechecked her BP and got 140/80.  She will f/u with her PCP

## 2015-11-28 ENCOUNTER — Other Ambulatory Visit: Payer: Medicare Other

## 2015-11-28 DIAGNOSIS — N3080 Other cystitis without hematuria: Secondary | ICD-10-CM | POA: Diagnosis not present

## 2015-11-28 DIAGNOSIS — N3090 Cystitis, unspecified without hematuria: Secondary | ICD-10-CM

## 2015-11-29 LAB — HEPATITIS C ANTIBODY: HCV Ab: NEGATIVE

## 2015-12-08 DIAGNOSIS — J322 Chronic ethmoidal sinusitis: Secondary | ICD-10-CM | POA: Diagnosis not present

## 2015-12-08 DIAGNOSIS — J370 Chronic laryngitis: Secondary | ICD-10-CM | POA: Diagnosis not present

## 2015-12-08 DIAGNOSIS — J320 Chronic maxillary sinusitis: Secondary | ICD-10-CM | POA: Diagnosis not present

## 2015-12-09 DIAGNOSIS — F411 Generalized anxiety disorder: Secondary | ICD-10-CM | POA: Diagnosis not present

## 2015-12-13 DIAGNOSIS — N3010 Interstitial cystitis (chronic) without hematuria: Secondary | ICD-10-CM | POA: Diagnosis not present

## 2015-12-16 DIAGNOSIS — M4806 Spinal stenosis, lumbar region: Secondary | ICD-10-CM | POA: Diagnosis not present

## 2015-12-16 DIAGNOSIS — M25561 Pain in right knee: Secondary | ICD-10-CM | POA: Diagnosis not present

## 2015-12-16 DIAGNOSIS — M25562 Pain in left knee: Secondary | ICD-10-CM | POA: Diagnosis not present

## 2015-12-16 DIAGNOSIS — M1288 Other specific arthropathies, not elsewhere classified, other specified site: Secondary | ICD-10-CM | POA: Diagnosis not present

## 2015-12-22 DIAGNOSIS — F411 Generalized anxiety disorder: Secondary | ICD-10-CM | POA: Diagnosis not present

## 2015-12-22 DIAGNOSIS — N3010 Interstitial cystitis (chronic) without hematuria: Secondary | ICD-10-CM | POA: Diagnosis not present

## 2015-12-27 DIAGNOSIS — N3010 Interstitial cystitis (chronic) without hematuria: Secondary | ICD-10-CM | POA: Diagnosis not present

## 2015-12-28 DIAGNOSIS — F411 Generalized anxiety disorder: Secondary | ICD-10-CM | POA: Diagnosis not present

## 2015-12-29 DIAGNOSIS — M47817 Spondylosis without myelopathy or radiculopathy, lumbosacral region: Secondary | ICD-10-CM | POA: Diagnosis not present

## 2016-01-04 DIAGNOSIS — J410 Simple chronic bronchitis: Secondary | ICD-10-CM | POA: Diagnosis not present

## 2016-01-04 DIAGNOSIS — J040 Acute laryngitis: Secondary | ICD-10-CM | POA: Diagnosis not present

## 2016-01-04 DIAGNOSIS — J322 Chronic ethmoidal sinusitis: Secondary | ICD-10-CM | POA: Diagnosis not present

## 2016-01-04 DIAGNOSIS — R0683 Snoring: Secondary | ICD-10-CM | POA: Diagnosis not present

## 2016-01-04 DIAGNOSIS — F411 Generalized anxiety disorder: Secondary | ICD-10-CM | POA: Diagnosis not present

## 2016-01-04 DIAGNOSIS — J320 Chronic maxillary sinusitis: Secondary | ICD-10-CM | POA: Diagnosis not present

## 2016-01-06 DIAGNOSIS — M9983 Other biomechanical lesions of lumbar region: Secondary | ICD-10-CM | POA: Diagnosis not present

## 2016-01-06 DIAGNOSIS — F419 Anxiety disorder, unspecified: Secondary | ICD-10-CM | POA: Diagnosis not present

## 2016-01-06 DIAGNOSIS — I10 Essential (primary) hypertension: Secondary | ICD-10-CM | POA: Diagnosis not present

## 2016-01-09 ENCOUNTER — Other Ambulatory Visit: Payer: Self-pay | Admitting: Urology

## 2016-01-09 DIAGNOSIS — F411 Generalized anxiety disorder: Secondary | ICD-10-CM | POA: Diagnosis not present

## 2016-01-10 ENCOUNTER — Encounter (HOSPITAL_BASED_OUTPATIENT_CLINIC_OR_DEPARTMENT_OTHER): Payer: Self-pay | Admitting: *Deleted

## 2016-01-10 NOTE — Progress Notes (Signed)
NPO AFTER MN.  ARRIVE AT 0930.  NEEDS ISTAT 8 AND EKG.  WILL TAKE BYSTOLIC , PRILOSEC, AND CARAFATE AM DOS W/ SIPS OF WATER.  WILL BRING HER OWN SELF CATH SUPPLY.

## 2016-01-12 ENCOUNTER — Other Ambulatory Visit: Payer: Self-pay

## 2016-01-12 ENCOUNTER — Ambulatory Visit (HOSPITAL_BASED_OUTPATIENT_CLINIC_OR_DEPARTMENT_OTHER): Payer: Medicare Other | Admitting: Anesthesiology

## 2016-01-12 ENCOUNTER — Ambulatory Visit (HOSPITAL_BASED_OUTPATIENT_CLINIC_OR_DEPARTMENT_OTHER)
Admission: RE | Admit: 2016-01-12 | Discharge: 2016-01-12 | Disposition: A | Discharge status: Home / Self Care | Payer: Medicare Other | Source: Ambulatory Visit | Attending: Urology | Admitting: Urology

## 2016-01-12 ENCOUNTER — Encounter (HOSPITAL_BASED_OUTPATIENT_CLINIC_OR_DEPARTMENT_OTHER)
Admission: RE | Disposition: A | Discharge status: Home / Self Care | Payer: Self-pay | Source: Ambulatory Visit | Attending: Urology

## 2016-01-12 ENCOUNTER — Encounter (HOSPITAL_BASED_OUTPATIENT_CLINIC_OR_DEPARTMENT_OTHER): Payer: Self-pay | Admitting: Anesthesiology

## 2016-01-12 DIAGNOSIS — I10 Essential (primary) hypertension: Secondary | ICD-10-CM | POA: Diagnosis not present

## 2016-01-12 DIAGNOSIS — Z79899 Other long term (current) drug therapy: Secondary | ICD-10-CM | POA: Diagnosis not present

## 2016-01-12 DIAGNOSIS — N3010 Interstitial cystitis (chronic) without hematuria: Secondary | ICD-10-CM | POA: Diagnosis not present

## 2016-01-12 DIAGNOSIS — Z79891 Long term (current) use of opiate analgesic: Secondary | ICD-10-CM | POA: Insufficient documentation

## 2016-01-12 DIAGNOSIS — K219 Gastro-esophageal reflux disease without esophagitis: Secondary | ICD-10-CM | POA: Diagnosis not present

## 2016-01-12 DIAGNOSIS — N952 Postmenopausal atrophic vaginitis: Secondary | ICD-10-CM | POA: Insufficient documentation

## 2016-01-12 DIAGNOSIS — Z6834 Body mass index (BMI) 34.0-34.9, adult: Secondary | ICD-10-CM | POA: Diagnosis not present

## 2016-01-12 DIAGNOSIS — M797 Fibromyalgia: Secondary | ICD-10-CM | POA: Insufficient documentation

## 2016-01-12 DIAGNOSIS — M1990 Unspecified osteoarthritis, unspecified site: Secondary | ICD-10-CM | POA: Diagnosis not present

## 2016-01-12 DIAGNOSIS — G4730 Sleep apnea, unspecified: Secondary | ICD-10-CM | POA: Insufficient documentation

## 2016-01-12 DIAGNOSIS — E669 Obesity, unspecified: Secondary | ICD-10-CM | POA: Insufficient documentation

## 2016-01-12 DIAGNOSIS — Z09 Encounter for follow-up examination after completed treatment for conditions other than malignant neoplasm: Secondary | ICD-10-CM | POA: Diagnosis present

## 2016-01-12 HISTORY — DX: Chronic bladder pain: R39.82

## 2016-01-12 HISTORY — DX: Diaphragmatic hernia without obstruction or gangrene: K44.9

## 2016-01-12 HISTORY — DX: Presence of spectacles and contact lenses: Z97.3

## 2016-01-12 HISTORY — DX: Nausea with vomiting, unspecified: Z98.890

## 2016-01-12 HISTORY — DX: Presence of urogenital implants: Z96.0

## 2016-01-12 HISTORY — DX: Obstructive sleep apnea (adult) (pediatric): G47.33

## 2016-01-12 HISTORY — DX: Nausea with vomiting, unspecified: R11.2

## 2016-01-12 HISTORY — DX: Essential (primary) hypertension: I10

## 2016-01-12 HISTORY — DX: Interstitial cystitis (chronic) without hematuria: N30.10

## 2016-01-12 HISTORY — DX: Other specified health status: Z78.9

## 2016-01-12 HISTORY — DX: Chronic fatigue, unspecified: R53.82

## 2016-01-12 HISTORY — DX: Other specified mononeuropathies: G58.8

## 2016-01-12 HISTORY — DX: Presence of dental prosthetic device (complete) (partial): Z97.2

## 2016-01-12 HISTORY — DX: Myalgic encephalomyelitis/chronic fatigue syndrome: G93.32

## 2016-01-12 HISTORY — PX: CYSTO WITH HYDRODISTENSION: SHX5453

## 2016-01-12 LAB — POCT I-STAT, CHEM 8
BUN: 19 mg/dL (ref 6–20)
Calcium, Ion: 1.25 mmol/L (ref 1.15–1.40)
Chloride: 104 mmol/L (ref 101–111)
Creatinine, Ser: 0.7 mg/dL (ref 0.44–1.00)
Glucose, Bld: 95 mg/dL (ref 65–99)
HCT: 39 % (ref 36.0–46.0)
Hemoglobin: 13.3 g/dL (ref 12.0–15.0)
Potassium: 4.2 mmol/L (ref 3.5–5.1)
Sodium: 142 mmol/L (ref 135–145)
TCO2: 27 mmol/L (ref 0–100)

## 2016-01-12 SURGERY — CYSTOSCOPY, WITH BLADDER HYDRODISTENSION
Anesthesia: General | Site: Bladder

## 2016-01-12 MED ORDER — FENTANYL CITRATE (PF) 100 MCG/2ML IJ SOLN
INTRAMUSCULAR | Status: DC | PRN
  Administered 2016-01-12 (×2): 50 ug via INTRAVENOUS

## 2016-01-12 MED ORDER — FENTANYL CITRATE (PF) 100 MCG/2ML IJ SOLN
25.0000 ug | INTRAMUSCULAR | Status: DC | PRN
  Administered 2016-01-12: 50 ug via INTRAVENOUS
  Filled 2016-01-12: qty 1

## 2016-01-12 MED ORDER — LACTATED RINGERS IV SOLN
INTRAVENOUS | Status: DC
  Administered 2016-01-12 (×2): via INTRAVENOUS
  Filled 2016-01-12: qty 1000

## 2016-01-12 MED ORDER — LIDOCAINE 2% (20 MG/ML) 5 ML SYRINGE
INTRAMUSCULAR | Status: AC
  Filled 2016-01-12: qty 5

## 2016-01-12 MED ORDER — STERILE WATER FOR IRRIGATION IR SOLN
Status: DC | PRN
  Administered 2016-01-12: 3000 mL

## 2016-01-12 MED ORDER — FENTANYL CITRATE (PF) 100 MCG/2ML IJ SOLN
INTRAMUSCULAR | Status: AC
  Filled 2016-01-12: qty 2

## 2016-01-12 MED ORDER — HYDROCODONE-ACETAMINOPHEN 5-325 MG PO TABS
1.0000 | ORAL_TABLET | Freq: Once | ORAL | Status: AC
  Administered 2016-01-12: 1 via ORAL
  Filled 2016-01-12: qty 1

## 2016-01-12 MED ORDER — TRIAMCINOLONE ACETONIDE 40 MG/ML IJ SUSP
INTRAMUSCULAR | Status: DC | PRN
  Administered 2016-01-12: 40 mg via INTRAMUSCULAR

## 2016-01-12 MED ORDER — ONDANSETRON HCL 4 MG/2ML IJ SOLN
INTRAMUSCULAR | Status: DC | PRN
  Administered 2016-01-12: 4 mg via INTRAVENOUS

## 2016-01-12 MED ORDER — KETOROLAC TROMETHAMINE 30 MG/ML IJ SOLN
INTRAMUSCULAR | Status: DC | PRN
  Administered 2016-01-12: 15 mg via INTRAVENOUS

## 2016-01-12 MED ORDER — LIDOCAINE 2% (20 MG/ML) 5 ML SYRINGE
INTRAMUSCULAR | Status: DC | PRN
  Administered 2016-01-12: 100 mg via INTRAVENOUS

## 2016-01-12 MED ORDER — DEXAMETHASONE SODIUM PHOSPHATE 10 MG/ML IJ SOLN
INTRAMUSCULAR | Status: AC
  Filled 2016-01-12: qty 1

## 2016-01-12 MED ORDER — BUPIVACAINE HCL 0.5 % IJ SOLN
INTRAMUSCULAR | Status: DC | PRN
  Administered 2016-01-12: 30 mL

## 2016-01-12 MED ORDER — METOCLOPRAMIDE HCL 5 MG/ML IJ SOLN
INTRAMUSCULAR | Status: DC | PRN
  Administered 2016-01-12: 10 mg via INTRAVENOUS

## 2016-01-12 MED ORDER — GENTAMICIN SULFATE 40 MG/ML IJ SOLN
120.0000 mg | Freq: Once | INTRAVENOUS | Status: DC
  Filled 2016-01-12: qty 3

## 2016-01-12 MED ORDER — DEXAMETHASONE SODIUM PHOSPHATE 4 MG/ML IJ SOLN
INTRAMUSCULAR | Status: DC | PRN
  Administered 2016-01-12: 10 mg via INTRAVENOUS

## 2016-01-12 MED ORDER — ONDANSETRON HCL 4 MG/2ML IJ SOLN
INTRAMUSCULAR | Status: AC
  Filled 2016-01-12: qty 2

## 2016-01-12 MED ORDER — HYDROCODONE-ACETAMINOPHEN 5-325 MG PO TABS
ORAL_TABLET | ORAL | Status: AC
  Filled 2016-01-12: qty 1

## 2016-01-12 MED ORDER — PROMETHAZINE HCL 25 MG/ML IJ SOLN
6.2500 mg | INTRAMUSCULAR | Status: DC | PRN
  Filled 2016-01-12: qty 1

## 2016-01-12 MED ORDER — METOCLOPRAMIDE HCL 5 MG/ML IJ SOLN
INTRAMUSCULAR | Status: AC
  Filled 2016-01-12: qty 2

## 2016-01-12 MED ORDER — GENTAMICIN IN SALINE 1.6-0.9 MG/ML-% IV SOLN
80.0000 mg | Freq: Once | INTRAVENOUS | Status: DC
  Filled 2016-01-12 (×2): qty 50

## 2016-01-12 MED ORDER — PROPOFOL 10 MG/ML IV BOLUS
INTRAVENOUS | Status: DC | PRN
  Administered 2016-01-12: 30 mg via INTRAVENOUS
  Administered 2016-01-12: 170 mg via INTRAVENOUS

## 2016-01-12 MED ORDER — PROPOFOL 10 MG/ML IV BOLUS
INTRAVENOUS | Status: AC
  Filled 2016-01-12: qty 20

## 2016-01-12 SURGICAL SUPPLY — 31 items
ADAPTER CATH WHT DISP STRL (CATHETERS) IMPLANT
ADPR CATH MP STRL LF DISP BD (CATHETERS)
BAG DRAIN URO-CYSTO SKYTR STRL (DRAIN) ×2 IMPLANT
BAG DRN UROCATH (DRAIN) ×1
BOOTIES KNEE HIGH SLOAN (MISCELLANEOUS) ×2 IMPLANT
CATH ROBINSON RED A/P 16FR (CATHETERS) ×1 IMPLANT
CLOTH BEACON ORANGE TIMEOUT ST (SAFETY) ×2 IMPLANT
ELECT REM PT RETURN 9FT ADLT (ELECTROSURGICAL)
ELECTRODE REM PT RTRN 9FT ADLT (ELECTROSURGICAL) ×1 IMPLANT
GLOVE BIO SURGEON STRL SZ7.5 (GLOVE) ×2 IMPLANT
GLOVE BIOGEL PI IND STRL 7.5 (GLOVE) IMPLANT
GLOVE BIOGEL PI INDICATOR 7.5 (GLOVE) ×2
GLOVE ECLIPSE 8.0 STRL XLNG CF (GLOVE) ×1 IMPLANT
GOWN STRL REUS W/ TWL LRG LVL3 (GOWN DISPOSABLE) ×1 IMPLANT
GOWN STRL REUS W/ TWL XL LVL3 (GOWN DISPOSABLE) ×1 IMPLANT
GOWN STRL REUS W/TWL LRG LVL3 (GOWN DISPOSABLE) ×1 IMPLANT
GOWN STRL REUS W/TWL XL LVL3 (GOWN DISPOSABLE) ×2
KIT ROOM TURNOVER WOR (KITS) ×2 IMPLANT
MANIFOLD NEPTUNE II (INSTRUMENTS) ×1 IMPLANT
NDL SAFETY ECLIPSE 18X1.5 (NEEDLE) ×1 IMPLANT
NDL SPNL 22GX7 QUINCKE BK (NEEDLE) IMPLANT
NEEDLE HYPO 18GX1.5 SHARP (NEEDLE) ×2
NEEDLE HYPO 22GX1.5 SAFETY (NEEDLE) ×1 IMPLANT
NEEDLE SPNL 22GX7 QUINCKE BK (NEEDLE) ×2 IMPLANT
NS IRRIG 500ML POUR BTL (IV SOLUTION) IMPLANT
PACK CYSTO (CUSTOM PROCEDURE TRAY) ×2 IMPLANT
SYR 20CC LL (SYRINGE) ×3 IMPLANT
SYR BULB IRRIGATION 50ML (SYRINGE) IMPLANT
SYRINGE 10CC LL (SYRINGE) ×1 IMPLANT
TUBE CONNECTING 12X1/4 (SUCTIONS) IMPLANT
WATER STERILE IRR 3000ML UROMA (IV SOLUTION) ×2 IMPLANT

## 2016-01-12 NOTE — Anesthesia Preprocedure Evaluation (Addendum)
Anesthesia Evaluation  Patient identified by MRN, date of birth, ID band Patient awake    Reviewed: Allergy & Precautions, NPO status , Patient's Chart, lab work & pertinent test results  History of Anesthesia Complications (+) PONV and history of anesthetic complications  Airway Mallampati: II  TM Distance: >3 FB Neck ROM: Full    Dental  (+) Partial Upper, Partial Lower   Pulmonary sleep apnea ,    Pulmonary exam normal breath sounds clear to auscultation       Cardiovascular hypertension, Pt. on medications and Pt. on home beta blockers Normal cardiovascular exam Rhythm:Regular Rate:Normal     Neuro/Psych  Neuromuscular disease negative psych ROS   GI/Hepatic Neg liver ROS, hiatal hernia, GERD  Medicated,  Endo/Other  negative endocrine ROS  Renal/GU negative Renal ROS  negative genitourinary   Musculoskeletal  (+) Fibromyalgia -  Abdominal (+) + obese,   Peds negative pediatric ROS (+)  Hematology negative hematology ROS (+)   Anesthesia Other Findings   Reproductive/Obstetrics negative OB ROS                           Anesthesia Physical Anesthesia Plan  ASA: II  Anesthesia Plan: General   Post-op Pain Management:    Induction: Intravenous  Airway Management Planned: LMA  Additional Equipment:   Intra-op Plan:   Post-operative Plan: Extubation in OR  Informed Consent: I have reviewed the patients History and Physical, chart, labs and discussed the procedure including the risks, benefits and alternatives for the proposed anesthesia with the patient or authorized representative who has indicated his/her understanding and acceptance.   Dental advisory given  Plan Discussed with: CRNA  Anesthesia Plan Comments:         Anesthesia Quick Evaluation

## 2016-01-12 NOTE — Progress Notes (Signed)
Dr Council Mechanicenenny and Dr Su Hoffannendaum were made aware pt has no one to stay with her the reminder of the day/tonight.This was discussed in pre-op-Will plan to keep longer in discharge phase to make sure steady,no dizziness. Has friend who will pick her up and take her home is able to stay with her  until settled, will call to check on her later in the evening also.

## 2016-01-12 NOTE — Discharge Instructions (Addendum)
Interstitial Cystitis °Interstitial cystitis is a condition that causes inflammation of the bladder. The bladder is a hollow organ in the lower part of your abdomen. It stores urine after the urine is made by your kidneys. With interstitial cystitis, you may have pain in the bladder area. You may also have a frequent and urgent need to urinate. °The severity of interstitial cystitis can vary from person to person. You may have flare-ups of the condition, and then it may go away for a while. For many people who have this condition, it becomes a long-term problem. °CAUSES °The cause of this condition is not known. °RISK FACTORS °This condition is more likely to develop in women. °SYMPTOMS °Symptoms of interstitial cystitis vary, and they can change over time. Symptoms may include: °· Discomfort or pain in the bladder area. This can range from mild to severe. The pain may change in intensity as the bladder fills with urine or as it empties. °· Pelvic pain. °· An urgent need to urinate. °· Frequent urination. °· Pain during sexual intercourse. °· Pinpoint bleeding on the bladder wall. °For women, the symptoms often get worse during menstruation. °DIAGNOSIS °This condition is diagnosed by evaluating your symptoms and ruling out other causes. A physical exam will be done. Various tests may be done to rule out other conditions. Common tests include: °· Urine tests. °· Cystoscopy. In this test, a tool that is like a very thin telescope is used to look into your bladder. °· Biopsy. This involves taking a sample of tissue from the bladder wall to be examined under a microscope. °TREATMENT °There is no cure for interstitial cystitis, but treatment methods are available to control your symptoms. Work closely with your health care provider to find the treatments that will be most effective for you. Treatment options may include: °· Medicines to relieve pain and to help reduce the number of times that you feel the need to  urinate. °· Bladder training. This involves learning ways to control when you urinate, such as: °¨ Urinating at scheduled times. °¨ Training yourself to delay urination. °¨ Doing exercises (Kegel exercises) to strengthen the muscles that control urine flow. °· Lifestyle changes, such as changing your diet or taking steps to control stress. °· Use of a device that provides electrical stimulation in order to reduce pain. °· A procedure that stretches your bladder by filling it with air or fluid. °· Surgery. This is rare. It is only done for extreme cases if other treatments do not help. °HOME CARE INSTRUCTIONS °· Take medicines only as directed by your health care provider. °· Use bladder training techniques as directed. °¨ Keep a bladder diary to find out which foods, liquids, or activities make your symptoms worse. °¨ Use your bladder diary to schedule bathroom trips. If you are away from home, plan to be near a bathroom at each of your scheduled times. °¨ Make sure you urinate just before you leave the house and just before you go to bed. °· Do Kegel exercises as directed by your health care provider. °· Do not drink alcohol. °· Do not use any tobacco products, including cigarettes, chewing tobacco, or electronic cigarettes. If you need help quitting, ask your health care provider. °· Make dietary changes as directed by your health care provider. You may need to avoid spicy foods and foods that contain a high amount of potassium. °· Limit your drinking of beverages that stimulate urination. These include soda, coffee, and tea. °· Keep all follow-up   visits as directed by your health care provider. This is important. SEEK MEDICAL CARE IF:  Your symptoms do not get better after treatment.  Your pain and discomfort are getting worse.  You have more frequent urges to urinate.  You have a fever. SEEK IMMEDIATE MEDICAL CARE IF:  You are not able to control your bladder at all.   This information is not  intended to replace advice given to you by your health care provider. Make sure you discuss any questions you have with your health care provider.   Document Released: 11/18/2003 Document Revised: 04/09/2014 Document Reviewed: 11/24/2013 Elsevier Interactive Patient Education 2016 [employer] CYSTOSCOPY HOME CARE INSTRUCTIONS  Activity: Rest for the remainder of the day.  Do not drive or operate equipment today.  You may resume normal activities in one to two days as instructed by your physician.   Meals: Drink plenty of liquids and eat light foods such as gelatin or soup this evening.  You may return to a normal meal plan tomorrow.  Return to Work: You may return to work in one to two days or as instructed by your physician.  Special Instructions / Symptoms: Call your physician if any of these symptoms occur:   -persistent or heavy bleeding  -bleeding which continues after first few urination  -large blood clots that are difficult to pass  -urine stream diminishes or stops completely  -fever equal to or higher than 101 degrees Farenheit.  -cloudy urine with a strong, foul odor  -severe pain  Females should always wipe from front to back after elimination.  You may feel some burning pain when you urinate.  This should disappear with time.  Applying moist heat to the lower abdomen or a hot tub bath may help relieve the pain. \  Follow-Up / Date of Return Visit to Your Physician:   Call for an appointment to arrange follow-up.  Patient Signature:  ________________________________________________________  Nurse's Signature:  ________________________________________________________  Post Anesthesia Home Care Instructions  Activity: Get plenty of rest for the remainder of the day. A responsible adult should stay with you for 24 hours following the procedure.  For the next 24 hours, DO NOT: -Drive a car -Advertising copywriterperate machinery -Drink alcoholic beverages -Take any medication unless  instructed by your physician -Make any legal decisions or sign important papers.  Meals: Start with liquid foods such as gelatin or soup. Progress to regular foods as tolerated. Avoid greasy, spicy, heavy foods. If nausea and/or vomiting occur, drink only clear liquids until the nausea and/or vomiting subsides. Call your physician if vomiting continues.  Special Instructions/Symptoms: Your throat may feel dry or sore from the anesthesia or the breathing tube placed in your throat during surgery. If this causes discomfort, gargle with warm salt water. The discomfort should disappear within 24 hours.  If you had a scopolamine patch placed behind your ear for the management of post- operative nausea and/or vomiting:  1. The medication in the patch is effective for 72 hours, after which it should be removed.  Wrap patch in a tissue and discard in the trash. Wash hands thoroughly with soap and water. 2. You may remove the patch earlier than 72 hours if you experience unpleasant side effects which may include dry mouth, dizziness or visual disturbances. 3. Avoid touching the patch. Wash your hands with soap and water after contact with the patch.

## 2016-01-12 NOTE — Op Note (Signed)
Pre-operative diagnosis : Symptomatic interstitial cystitis, status post cecocystoplasty  Postoperative diagnosis: Same  Operation: Cystourethroscopy, hydrodistention to 6 and 25 mL, instillation of Marcaine 0.25 plain and the bladder process 20 mL) and injection of Kenalog in the subtrigonal space(20 m  Surgeon:  S. Patsi Sears, MD  First assistant: None  Anesthesia:  Gen. LMA  Preparation: After appropriate preanesthesia, the patient is brought to the operative room, placed on the operating table in the dorsal supine position where general LMA anesthesia was introduced. She was replaced in the dorsal lithotomy position with the pubis was prepped with Betadine solution and draped in usual fashion. She received IV gentamicin prior to anesthetic induction. The armband was double checked. History was double checked. Allergies were reviewed.  Review history:  1. 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 Chronic cystitis (N30.20)  Assessed By: Jethro Bolus (Urology); Last Assessed: 22 Jun 2015 3. 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 She has chronic E. Coli bacteria. She has seen Dr. Ninetta Lights (ID) on several occasions since her last OV here. Last C&S on 05/03/15 showed E. Coli and prescribed Macrobid. She is currently taking Ellura.  01/26/15 E. Coli 50K colonies - treated with Gentamicin 80mg  IM x 7 days 01/10/15 E. Coli 100K colonies - treated with Gentamicin 80mg  IM x 3 days 12/24/14 Proteus 55K colonies - treated with Keflex 500mg  BID x 3 days 12/17/14 Protues 15K colonies, no abx  12/07/14 E. Coli 50K colonies - treated with Gentamicin 80mg  IM x 2 days 11/09/14 E. Coli 100K colonies - tretaed with Nitrofurantoin BID x 14 days About 1 yr ago was in a cycle of recurrent E coli UTI which resolved with 4 rounds of  Gentamicin 80 mg/100 ml sterile water bladder irrigation.  S/p cecocystoplasty in 1984. Currently on Methenamine 1 GM po daily for suppression.  She had a Renal u/s on 04/18/11 which was normal except for a Rt upper pole hyperechoic area 0.49cm ? stone.  Last cysto/HOD on 08/19/09. She has done Elmiron bladder instillations in the past & done well with those. Last UDS was in 2008.  Past Medical History Problems  1. History of Acute cystitis without hematuria (N30.00) 2. History of Arthritis 3. History of esophageal reflux (Z87.19) 4. History of hypercholesterolemia (Z86.39) 5. History of hypertension (Z86.79) 6. History of Vaginal candidiasis (B37.3) Surgical History Problems  1. History of Bladder Enterocystoplasty 2. History of Bladder Irrigation 3. History of Cholecystectomy 4. History of Colposcopy Vagina With Biopsy(S) 5. History of Cystoscopy With Dilation Of Bladder 6. History of Cystoscopy With Dilation Of Bladder Under Local Anesthesia 7. History of Hysterectomy 8. History of Neuroplasty Median Nerve At Carpal Tunnel 9. History of Oral Surgery 10. History of Sinus Surgery 11. History of Tonsillectomy Current Meds 1. Allegra 60 MG TABS; Therapy: (Recorded:12Aug2011) to Recorded 2. Bystolic 10 MG Oral Tablet; Therapy: (Recorded:12Aug2011) to Recorded 3. Cephalexin 500 MG Oral Capsule; Take 1 capsule twice daily; Therapy: 28Sep2016 to (Last Rx:28Sep2016) Requested for: 28Sep2016 Ordered 4. Ellura 200 MG Oral Capsule; TAKE 1 CAPSULE Daily; Therapy: 02Dec2016 to (Evaluate:13Jan2017); Last Rx:02Dec2016 Ordered 5. Elmiron 100 MG Oral Capsule; USE TWO CAPSULES PER BLADDER INSTILLATION AS NEEDED; Therapy: 24Jun2011 to (Evaluate:01Oct2016) Requested for: 01Sep2016; Last Rx:01Sep2016 Ordered 6. Gaviscon CHEW; Therapy: (Recorded:17Dec2007) to Recorded 7. Hydrocodone-Acetaminophen 7.5-325 MG Oral Tablet; Therapy: (Recorded:09Aug2016) to Recorded 8. Hyophen 81.6  MG Oral Tablet;  TAKE 1 TABLET BY MOUTH 4 TIMES DAILY AS NEEDED FOR BURNING OR PAIN; Therapy: 03Oct2012 to (Evaluate:23Sep2016) Requested for: 12Aug2016; Last Rx:12Aug2016 Ordered 9. Methenamine Hippurate 1 GM Oral Tablet; TAKE 1 TABLET IN THE P.M; Therapy: 24Jun2009 to (Evaluate:07Jul2016) Requested for: 13Jul2015; Last Rx:13Jul2015 Ordered 10. Omeprazole 20 MG Oral Capsule Delayed Release; Therapy: (Recorded:03Oct2012) to Recorded 11. Sucralfate TABS; Therapy: (Recorded:02Jul2014) to Recorded 12. Uribel 118 MG Oral Capsule; TAKE 1 CAPSULE 3 times daily PRN Dysuria; Therapy: 08Nov2016 to (Last Rx:08Nov2016) Ordered 13. Urogesic-Blue 81.6 MG Oral Tablet; TAKE 1 TABLET 4 times daily; Therapy: 18Oct2016 to (Evaluate:17Nov2016); Last Rx:18Oct2016 Ordered 14. Utira-C 81.6 MG Oral Tablet; TAKE 1 TABLET 3 times daily; Therapy: 18Oct2016 to (Evaluate:28Oct2016); Last Rx:18Oct2016 Ordered 15. Vitamin D TABS; Therapy: (Recorded:10Dec2014) to Recorded 16. Xanax 0.5 MG Oral Tablet; prn; Therapy: (Recorded:12Aug2011) to Recorded Allergies Medication  1. Amoxicillin TABS 2. Augmentin TABS 3. Codeine Derivatives 4. Dimethyl Sulfoxide SOLN 5. Sulfa Drugs 6. Aspirin TABS 7. Avelox TABS 8. Macrodantin CAPS Non-Medication  9. Food Dye  Statement of  Likelihood of Success: Excellent. TIME-OUT observed.:  Procedure: Cystourethroscopy was accomplished, and the patient was noted to have an hourglass appearance of her bladder. The bladder was hydrodistended to 6 and 25 mL. The patient's native bladder was noted to be hemorrhagic, and this was photo documented. 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  Following HOD, the bladder drained of fluid, and Marcaine was instilled in the bladder. Following this, Kenalog was injected into the subtrigonal space.  The patient received IV Toradol, and was awakened, and taken to recovery room in good condition.

## 2016-01-12 NOTE — Interval H&P Note (Signed)
History and Physical Interval Note:  01/12/2016 10:08 AM  Jennifer [name]  has presented today for surgery, with the diagnosis of BLADDER PAIN   The various methods of treatment have been discussed with the patient and family. After consideration of risks, benefits and other options for treatment, the patient has consented to  Procedure(s): CYSTOSCOPY/HYDRODISTENSION (N/A) as a surgical intervention .  The patient's history has been reviewed, patient examined, no change in status, stable for surgery.  I have reviewed the patient's chart and labs.  Questions were answered to the patient's satisfaction.     Amarachukwu Lakatos I Xavyer Steenson

## 2016-01-12 NOTE — Transfer of Care (Signed)
Immediate Anesthesia Transfer of Care Note  Patient: Jennifer [name]  Procedure(s) Performed: Procedure(s): CYSTOSCOPY/HYDRODISTENSION (N/A)  Patient Location: PACU  Anesthesia Type:General  Level of Consciousness: awake, alert  and oriented  Airway & Oxygen Therapy: Patient Spontanous Breathing and Patient connected to nasal cannula oxygen  Post-op Assessment: Report given to RN  Post vital signs: Reviewed and stable  Last Vitals: 158/77, 54, 13, 100%, 98.3 Vitals:   01/12/16 0953  BP: (!) 189/133  Pulse: (!) 56  Resp: (!) 22  Temp: 37.2 C    Last Pain:  Vitals:   01/12/16 1039  TempSrc:   PainSc: 4       Patients Stated Pain Goal: 6 (01/12/16 1039)  Complications: No apparent anesthesia complications

## 2016-01-12 NOTE — Anesthesia Postprocedure Evaluation (Signed)
Anesthesia Post Note  Patient: Jennifer ReamerBarbara E Kidney  Procedure(s) Performed: Procedure(s) (LRB): CYSTOSCOPY/HYDRODISTENSION (N/A)  Patient location during evaluation: PACU Anesthesia Type: General Level of consciousness: awake and alert and patient cooperative Pain management: pain level controlled Vital Signs Assessment: post-procedure vital signs reviewed and stable Respiratory status: spontaneous breathing and respiratory function stable Cardiovascular status: stable Anesthetic complications: no    Last Vitals:  Vitals:   01/12/16 1215 01/12/16 1230  BP: (!) 161/76 (!) 161/75  Pulse: (!) 55 (!) 50  Resp: 14 14  Temp:      Last Pain:  Vitals:   01/12/16 1215  TempSrc:   PainSc: 4                  Mckenzee Beem S

## 2016-01-12 NOTE — H&P (Signed)
[facility]         MRN: [id number]  PROVIDER:  Hassel Neth   DOB: October 01, 1945     SSN: [id number]            Reason For Visit 3 mo f/u  Active Problems Problems  1. 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 Chronic cystitis (N30.20)  Assessed By: Jethro Bolus (Urology); Last Assessed: 22 Jun 2015 3. 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 She has chronic E. Coli bacteria. She has seen Dr. Ninetta Lights (ID) on several occasions since her last OV here. Last C&S on 05/03/15 showed E. Coli and prescribed Macrobid. She is currently taking Ellura.  01/26/15 E. Coli 50K colonies - treated with Gentamicin 80mg  IM x 7 days 01/10/15 E. Coli 100K colonies - treated with Gentamicin 80mg  IM x 3 days 12/24/14 Proteus 55K colonies - treated with Keflex 500mg  BID x 3 days 12/17/14 Protues 15K colonies, no abx  12/07/14 E. Coli 50K colonies - treated with Gentamicin 80mg  IM x 2 days 11/09/14 E. Coli 100K colonies - tretaed with Nitrofurantoin BID x 14 days About 1 yr ago was in a cycle of recurrent E coli UTI which resolved with 4 rounds of Gentamicin 80 mg/100 ml sterile water bladder irrigation.  S/p cecocystoplasty in 1984. Currently on Methenamine 1 GM po daily for suppression.  She had a Renal u/s on 04/18/11 which was normal except for a Rt upper pole hyperechoic area 0.49cm ? stone.  Last cysto/HOD on 08/19/09. She has done Elmiron bladder instillations in the past & done well with those. Last UDS was in 2008.  Past Medical History Problems  1. History of Acute cystitis without hematuria (N30.00) 2. History of Arthritis 3. History of esophageal reflux (Z87.19) 4. History of hypercholesterolemia (Z86.39) 5. History of hypertension (Z86.79) 6. History of Vaginal candidiasis (B37.3) Surgical History Problems  1. History  of Bladder Enterocystoplasty 2. History of Bladder Irrigation 3. History of Cholecystectomy 4. History of Colposcopy Vagina With Biopsy(S) 5. History of Cystoscopy With Dilation Of Bladder 6. History of Cystoscopy With Dilation Of Bladder Under Local Anesthesia 7. History of Hysterectomy 8. History of Neuroplasty Median Nerve At Carpal Tunnel 9. History of Oral Surgery 10. History of Sinus Surgery 11. History of Tonsillectomy Current Meds 1. Allegra 60 MG TABS; Therapy: (Recorded:12Aug2011) to Recorded 2. Bystolic 10 MG Oral Tablet; Therapy: (Recorded:12Aug2011) to Recorded 3. Cephalexin 500 MG Oral Capsule; Take 1 capsule twice daily; Therapy: 28Sep2016 to (Last Rx:28Sep2016) Requested for: 28Sep2016 Ordered 4. Ellura 200 MG Oral Capsule; TAKE 1 CAPSULE Daily; Therapy: 02Dec2016 to (Evaluate:13Jan2017); Last Rx:02Dec2016 Ordered 5. Elmiron 100 MG Oral Capsule; USE TWO CAPSULES PER BLADDER INSTILLATION AS NEEDED; Therapy: 24Jun2011 to (Evaluate:01Oct2016) Requested for: 01Sep2016; Last Rx:01Sep2016 Ordered 6. Gaviscon CHEW; Therapy: (Recorded:17Dec2007) to Recorded 7. Hydrocodone-Acetaminophen 7.5-325 MG Oral Tablet; Therapy: (Recorded:09Aug2016) to Recorded 8. Hyophen 81.6 MG Oral Tablet; TAKE 1 TABLET BY MOUTH 4 TIMES DAILY AS NEEDED FOR BURNING OR PAIN; Therapy: 03Oct2012 to (Evaluate:23Sep2016) Requested for: 12Aug2016; Last Rx:12Aug2016 Ordered 9. Methenamine Hippurate 1 GM Oral Tablet; TAKE 1 TABLET IN THE P.M; Therapy: 24Jun2009 to (Evaluate:07Jul2016) Requested for: 13Jul2015; Last Rx:13Jul2015 Ordered 10. Omeprazole 20 MG Oral Capsule Delayed Release; Therapy: (Recorded:03Oct2012) to Recorded 11. Sucralfate TABS; Therapy: (Recorded:02Jul2014) to Recorded 12. Uribel 118 MG Oral  Capsule; TAKE 1 CAPSULE 3 times daily PRN Dysuria; Therapy: 08Nov2016 to (Last Rx:08Nov2016) Ordered 13. Urogesic-Blue 81.6 MG Oral Tablet; TAKE 1 TABLET 4 times daily; Therapy: 18Oct2016 to  (Evaluate:17Nov2016); Last Rx:18Oct2016 Ordered 14. Utira-C 81.6 MG Oral Tablet; TAKE 1 TABLET 3 times daily; Therapy: 18Oct2016 to (Evaluate:28Oct2016); Last Rx:18Oct2016 Ordered 15. Vitamin D TABS; Therapy: (Recorded:10Dec2014) to Recorded 16. Xanax 0.5 MG Oral Tablet; prn; Therapy: (Recorded:12Aug2011) to Recorded Allergies Medication  1. Amoxicillin TABS 2. Augmentin TABS 3. Codeine Derivatives 4. Dimethyl Sulfoxide SOLN 5. Sulfa Drugs 6. Aspirin TABS 7. Avelox TABS 8. Macrodantin CAPS Non-Medication  9. Food Dye Family History Problems  1. Family history of Chronic Obstructive Pulmonary Disease 2. Family history of Coronary Artery Disease 3. Family history of Death of family member : Father  Dad died at age 68 from copd, asthma 4. Family history of Emphysema 5. Family history of Hypertension Social History Problems  1. Denied: History of Alcohol Use (History) 2. Caffeine Use  1-2 (NOT EVERY DAY) 3. Marital History - Single 4. Never A Smoker 5. Occupation:  TEACHER Review of Systems Genitourinary, constitutional, skin, eye, otolaryngeal, hematologic/lymphatic, cardiovascular, pulmonary, endocrine, musculoskeletal, gastrointestinal, neurological and psychiatric system(s) were reviewed and pertinent findings if present are noted and are otherwise negative.  Genitourinary: bladder pain.  Vitals Vital Signs [Data Includes: Last 1 Day]  Recorded: 22Mar2017 01:42PM  Blood Pressure: 173 / 74 Temperature: 98.2 F Heart Rate: 59 Physical Exam Constitutional: Well nourished and well developed . No acute distress.  ENT:. The ears and nose are normal in appearance.  Neck: The appearance of the neck is normal and no neck mass is present.  Pulmonary: No respiratory distress and normal respiratory rhythm and effort.  Abdomen: The abdomen is soft and nontender. No masses are palpated. No CVA tenderness. No hernias are palpable. No hepatosplenomegaly noted.  Lymphatics: The  femoral and inguinal nodes are not enlarged or tender.  Skin: Normal skin turgor, no visible rash and no visible skin lesions.  Neuro/Psych:. Mood and affect are appropriate.  Results/Data Urine [Data Includes: Last 1 Day]     22Mar2017   COLOR YELLOW    APPEARANCE CLEAR    SPECIFIC GRAVITY 1.025    pH 5.5    GLUCOSE NEGATIVE    BILIRUBIN NEGATIVE    KETONE NEGATIVE    BLOOD NEGATIVE    PROTEIN NEGATIVE    NITRITE NEGATIVE    LEUKOCYTE ESTERASE NEGATIVE    Assessment Assessed  1. 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 Chronic cystitis (N30.20) 3. 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 She is referred for Azo 2 BID. Push water ( recommend Brita filter)..  Plan Chronic interstitial cystitis without hematuria  1. Follow-up Month x 6 Office Follow-up Status: Hold For - Appointment,Date of Service  Health Maintenance  2. UA With REFLEX; [Do Not Release]; Status:Resulted - Requires Verification RTC 6 months. ( ok to take macrodantin, and augmentin, but cause GI distress over long period of time).  Discussion/Summary cc: Catha GosselinKevin Little, MD cc: [facility]. Hildred [facility]Sp[facility] cc: Infect[facility]

## 2016-01-12 NOTE — Anesthesia Procedure Notes (Signed)
Procedure Name: LMA Insertion Date/Time: 01/12/2016 11:01 AM Performed by: Maris BergerENENNY, Jennifer [name] Pre-anesthesia Checklist: Patient identified, Emergency Drugs available, Suction available and Patient being monitored Patient Re-evaluated:Patient Re-evaluated prior to inductionOxygen Delivery Method: Circle System Utilized Preoxygenation: Pre-oxygenation with 100% oxygen Intubation Type: IV induction Ventilation: Mask ventilation without difficulty LMA: LMA with gastric port inserted LMA Size: 4.0 Number of attempts: 1 Placement Confirmation: positive ETCO2 Tube secured with: Tape Dental Injury: Teeth and Oropharynx as per pre-operative assessment

## 2016-01-12 NOTE — Progress Notes (Signed)
Ok for patient to go home without anyone there for the next 24hr  Will keep in outpatient alittle longer to make sure patient is ok to go home.Marland Kitchen.ok'd bu Dr/ Payton Doughtyenneny and Dr. Patsi Searsannenbaum.

## 2016-01-13 ENCOUNTER — Encounter (HOSPITAL_BASED_OUTPATIENT_CLINIC_OR_DEPARTMENT_OTHER): Payer: Self-pay | Admitting: Urology

## 2016-01-19 DIAGNOSIS — F411 Generalized anxiety disorder: Secondary | ICD-10-CM | POA: Diagnosis not present

## 2016-01-20 DIAGNOSIS — N3000 Acute cystitis without hematuria: Secondary | ICD-10-CM | POA: Diagnosis not present

## 2016-01-24 DIAGNOSIS — N3000 Acute cystitis without hematuria: Secondary | ICD-10-CM | POA: Diagnosis not present

## 2016-01-24 DIAGNOSIS — M1288 Other specific arthropathies, not elsewhere classified, other specified site: Secondary | ICD-10-CM | POA: Diagnosis not present

## 2016-01-26 DIAGNOSIS — F411 Generalized anxiety disorder: Secondary | ICD-10-CM | POA: Diagnosis not present

## 2016-01-27 DIAGNOSIS — N3000 Acute cystitis without hematuria: Secondary | ICD-10-CM | POA: Diagnosis not present

## 2016-01-31 DIAGNOSIS — N3010 Interstitial cystitis (chronic) without hematuria: Secondary | ICD-10-CM | POA: Diagnosis not present

## 2016-02-09 DIAGNOSIS — J322 Chronic ethmoidal sinusitis: Secondary | ICD-10-CM | POA: Diagnosis not present

## 2016-02-09 DIAGNOSIS — J370 Chronic laryngitis: Secondary | ICD-10-CM | POA: Diagnosis not present

## 2016-02-09 DIAGNOSIS — J320 Chronic maxillary sinusitis: Secondary | ICD-10-CM | POA: Diagnosis not present

## 2016-02-09 DIAGNOSIS — N3010 Interstitial cystitis (chronic) without hematuria: Secondary | ICD-10-CM | POA: Diagnosis not present

## 2016-02-10 DIAGNOSIS — F411 Generalized anxiety disorder: Secondary | ICD-10-CM | POA: Diagnosis not present

## 2016-02-15 DIAGNOSIS — E7800 Pure hypercholesterolemia, unspecified: Secondary | ICD-10-CM | POA: Diagnosis not present

## 2016-02-15 DIAGNOSIS — M9983 Other biomechanical lesions of lumbar region: Secondary | ICD-10-CM | POA: Diagnosis not present

## 2016-02-15 DIAGNOSIS — I10 Essential (primary) hypertension: Secondary | ICD-10-CM | POA: Diagnosis not present

## 2016-02-15 DIAGNOSIS — E039 Hypothyroidism, unspecified: Secondary | ICD-10-CM | POA: Diagnosis not present

## 2016-02-15 DIAGNOSIS — Z79899 Other long term (current) drug therapy: Secondary | ICD-10-CM | POA: Diagnosis not present

## 2016-02-15 DIAGNOSIS — E559 Vitamin D deficiency, unspecified: Secondary | ICD-10-CM | POA: Diagnosis not present

## 2016-02-15 DIAGNOSIS — N3010 Interstitial cystitis (chronic) without hematuria: Secondary | ICD-10-CM | POA: Diagnosis not present

## 2016-02-16 DIAGNOSIS — N3010 Interstitial cystitis (chronic) without hematuria: Secondary | ICD-10-CM | POA: Diagnosis not present

## 2016-02-17 DIAGNOSIS — M9983 Other biomechanical lesions of lumbar region: Secondary | ICD-10-CM | POA: Diagnosis not present

## 2016-02-17 DIAGNOSIS — M25562 Pain in left knee: Secondary | ICD-10-CM | POA: Diagnosis not present

## 2016-02-17 DIAGNOSIS — Z23 Encounter for immunization: Secondary | ICD-10-CM | POA: Diagnosis not present

## 2016-02-27 DIAGNOSIS — F411 Generalized anxiety disorder: Secondary | ICD-10-CM | POA: Diagnosis not present

## 2016-03-06 DIAGNOSIS — M25562 Pain in left knee: Secondary | ICD-10-CM | POA: Diagnosis not present

## 2016-03-06 DIAGNOSIS — M1711 Unilateral primary osteoarthritis, right knee: Secondary | ICD-10-CM | POA: Diagnosis not present

## 2016-03-06 DIAGNOSIS — M1712 Unilateral primary osteoarthritis, left knee: Secondary | ICD-10-CM | POA: Diagnosis not present

## 2016-03-22 ENCOUNTER — Other Ambulatory Visit: Payer: Self-pay | Admitting: Surgical

## 2016-03-22 ENCOUNTER — Other Ambulatory Visit: Payer: Self-pay | Admitting: Orthopedic Surgery

## 2016-03-22 DIAGNOSIS — M9983 Other biomechanical lesions of lumbar region: Secondary | ICD-10-CM | POA: Diagnosis not present

## 2016-03-22 DIAGNOSIS — N3020 Other chronic cystitis without hematuria: Secondary | ICD-10-CM | POA: Diagnosis not present

## 2016-03-22 DIAGNOSIS — M25562 Pain in left knee: Secondary | ICD-10-CM

## 2016-03-22 DIAGNOSIS — R42 Dizziness and giddiness: Secondary | ICD-10-CM | POA: Diagnosis not present

## 2016-03-30 DIAGNOSIS — J370 Chronic laryngitis: Secondary | ICD-10-CM | POA: Diagnosis not present

## 2016-03-30 DIAGNOSIS — H902 Conductive hearing loss, unspecified: Secondary | ICD-10-CM | POA: Diagnosis not present

## 2016-03-30 DIAGNOSIS — B370 Candidal stomatitis: Secondary | ICD-10-CM | POA: Diagnosis not present

## 2016-03-30 DIAGNOSIS — J039 Acute tonsillitis, unspecified: Secondary | ICD-10-CM | POA: Diagnosis not present

## 2016-03-30 DIAGNOSIS — J322 Chronic ethmoidal sinusitis: Secondary | ICD-10-CM | POA: Diagnosis not present

## 2016-03-30 DIAGNOSIS — H6693 Otitis media, unspecified, bilateral: Secondary | ICD-10-CM | POA: Diagnosis not present

## 2016-03-30 DIAGNOSIS — H6523 Chronic serous otitis media, bilateral: Secondary | ICD-10-CM | POA: Diagnosis not present

## 2016-03-30 DIAGNOSIS — J320 Chronic maxillary sinusitis: Secondary | ICD-10-CM | POA: Diagnosis not present

## 2016-03-31 ENCOUNTER — Inpatient Hospital Stay: Admission: RE | Admit: 2016-03-31 | Payer: Medicare Other | Source: Ambulatory Visit

## 2016-04-07 ENCOUNTER — Ambulatory Visit
Admission: RE | Admit: 2016-04-07 | Discharge: 2016-04-07 | Disposition: A | Discharge status: Home / Self Care | Payer: Medicare Other | Source: Ambulatory Visit | Attending: Orthopedic Surgery | Admitting: Orthopedic Surgery

## 2016-04-07 DIAGNOSIS — M25562 Pain in left knee: Secondary | ICD-10-CM | POA: Diagnosis not present

## 2016-04-10 DIAGNOSIS — M5136 Other intervertebral disc degeneration, lumbar region: Secondary | ICD-10-CM | POA: Diagnosis not present

## 2016-04-10 DIAGNOSIS — M48062 Spinal stenosis, lumbar region with neurogenic claudication: Secondary | ICD-10-CM | POA: Diagnosis not present

## 2016-04-10 DIAGNOSIS — M533 Sacrococcygeal disorders, not elsewhere classified: Secondary | ICD-10-CM | POA: Diagnosis not present

## 2016-04-10 DIAGNOSIS — M1288 Other specific arthropathies, not elsewhere classified, other specified site: Secondary | ICD-10-CM | POA: Diagnosis not present

## 2016-04-12 DIAGNOSIS — N3010 Interstitial cystitis (chronic) without hematuria: Secondary | ICD-10-CM | POA: Diagnosis not present

## 2016-04-13 DIAGNOSIS — F411 Generalized anxiety disorder: Secondary | ICD-10-CM | POA: Diagnosis not present

## 2016-04-17 DIAGNOSIS — J322 Chronic ethmoidal sinusitis: Secondary | ICD-10-CM | POA: Diagnosis not present

## 2016-04-17 DIAGNOSIS — J320 Chronic maxillary sinusitis: Secondary | ICD-10-CM | POA: Diagnosis not present

## 2016-04-17 DIAGNOSIS — J370 Chronic laryngitis: Secondary | ICD-10-CM | POA: Diagnosis not present

## 2016-04-17 DIAGNOSIS — R300 Dysuria: Secondary | ICD-10-CM | POA: Diagnosis not present

## 2016-04-17 DIAGNOSIS — N3010 Interstitial cystitis (chronic) without hematuria: Secondary | ICD-10-CM | POA: Diagnosis not present

## 2016-04-23 DIAGNOSIS — M1711 Unilateral primary osteoarthritis, right knee: Secondary | ICD-10-CM | POA: Diagnosis not present

## 2016-04-23 DIAGNOSIS — M25562 Pain in left knee: Secondary | ICD-10-CM | POA: Diagnosis not present

## 2016-04-23 DIAGNOSIS — S83232A Complex tear of medial meniscus, current injury, left knee, initial encounter: Secondary | ICD-10-CM | POA: Diagnosis not present

## 2016-04-23 DIAGNOSIS — M1712 Unilateral primary osteoarthritis, left knee: Secondary | ICD-10-CM | POA: Diagnosis not present

## 2016-04-24 DIAGNOSIS — N3010 Interstitial cystitis (chronic) without hematuria: Secondary | ICD-10-CM | POA: Diagnosis not present

## 2016-04-27 DIAGNOSIS — M25561 Pain in right knee: Secondary | ICD-10-CM | POA: Diagnosis not present

## 2016-04-27 DIAGNOSIS — M25562 Pain in left knee: Secondary | ICD-10-CM | POA: Diagnosis not present

## 2016-04-27 DIAGNOSIS — M1288 Other specific arthropathies, not elsewhere classified, other specified site: Secondary | ICD-10-CM | POA: Diagnosis not present

## 2016-04-27 DIAGNOSIS — M48062 Spinal stenosis, lumbar region with neurogenic claudication: Secondary | ICD-10-CM | POA: Diagnosis not present

## 2016-05-02 DIAGNOSIS — H8143 Vertigo of central origin, bilateral: Secondary | ICD-10-CM | POA: Diagnosis not present

## 2016-05-03 DIAGNOSIS — F411 Generalized anxiety disorder: Secondary | ICD-10-CM | POA: Diagnosis not present

## 2016-05-03 DIAGNOSIS — N3010 Interstitial cystitis (chronic) without hematuria: Secondary | ICD-10-CM | POA: Diagnosis not present

## 2016-05-08 DIAGNOSIS — F411 Generalized anxiety disorder: Secondary | ICD-10-CM | POA: Diagnosis not present

## 2016-05-08 DIAGNOSIS — N3010 Interstitial cystitis (chronic) without hematuria: Secondary | ICD-10-CM | POA: Diagnosis not present

## 2016-05-17 DIAGNOSIS — J320 Chronic maxillary sinusitis: Secondary | ICD-10-CM | POA: Diagnosis not present

## 2016-05-17 DIAGNOSIS — J040 Acute laryngitis: Secondary | ICD-10-CM | POA: Diagnosis not present

## 2016-05-17 DIAGNOSIS — J322 Chronic ethmoidal sinusitis: Secondary | ICD-10-CM | POA: Diagnosis not present

## 2016-05-17 DIAGNOSIS — H6523 Chronic serous otitis media, bilateral: Secondary | ICD-10-CM | POA: Diagnosis not present

## 2016-05-17 DIAGNOSIS — H9313 Tinnitus, bilateral: Secondary | ICD-10-CM | POA: Diagnosis not present

## 2016-05-17 DIAGNOSIS — H8143 Vertigo of central origin, bilateral: Secondary | ICD-10-CM | POA: Diagnosis not present

## 2016-05-25 DIAGNOSIS — F411 Generalized anxiety disorder: Secondary | ICD-10-CM | POA: Diagnosis not present

## 2016-05-28 DIAGNOSIS — M48062 Spinal stenosis, lumbar region with neurogenic claudication: Secondary | ICD-10-CM | POA: Diagnosis not present

## 2016-05-28 DIAGNOSIS — M1711 Unilateral primary osteoarthritis, right knee: Secondary | ICD-10-CM | POA: Diagnosis not present

## 2016-05-29 DIAGNOSIS — H8143 Vertigo of central origin, bilateral: Secondary | ICD-10-CM | POA: Diagnosis not present

## 2016-05-29 DIAGNOSIS — N3000 Acute cystitis without hematuria: Secondary | ICD-10-CM | POA: Diagnosis not present

## 2016-05-29 DIAGNOSIS — H9313 Tinnitus, bilateral: Secondary | ICD-10-CM | POA: Diagnosis not present

## 2016-05-29 DIAGNOSIS — H903 Sensorineural hearing loss, bilateral: Secondary | ICD-10-CM | POA: Diagnosis not present

## 2016-05-30 DIAGNOSIS — F411 Generalized anxiety disorder: Secondary | ICD-10-CM | POA: Diagnosis not present

## 2016-05-31 ENCOUNTER — Other Ambulatory Visit: Payer: Self-pay | Admitting: Orthopedic Surgery

## 2016-05-31 DIAGNOSIS — M48062 Spinal stenosis, lumbar region with neurogenic claudication: Secondary | ICD-10-CM

## 2016-06-01 ENCOUNTER — Other Ambulatory Visit: Payer: Medicare Other

## 2016-06-01 DIAGNOSIS — Z1231 Encounter for screening mammogram for malignant neoplasm of breast: Secondary | ICD-10-CM | POA: Diagnosis not present

## 2016-06-01 DIAGNOSIS — E65 Localized adiposity: Secondary | ICD-10-CM | POA: Diagnosis not present

## 2016-06-01 DIAGNOSIS — Z1211 Encounter for screening for malignant neoplasm of colon: Secondary | ICD-10-CM | POA: Diagnosis not present

## 2016-06-05 DIAGNOSIS — I10 Essential (primary) hypertension: Secondary | ICD-10-CM | POA: Diagnosis not present

## 2016-06-05 DIAGNOSIS — Z79899 Other long term (current) drug therapy: Secondary | ICD-10-CM | POA: Diagnosis not present

## 2016-06-05 DIAGNOSIS — M9983 Other biomechanical lesions of lumbar region: Secondary | ICD-10-CM | POA: Diagnosis not present

## 2016-06-05 DIAGNOSIS — E559 Vitamin D deficiency, unspecified: Secondary | ICD-10-CM | POA: Diagnosis not present

## 2016-06-05 DIAGNOSIS — E7800 Pure hypercholesterolemia, unspecified: Secondary | ICD-10-CM | POA: Diagnosis not present

## 2016-06-05 DIAGNOSIS — E0590 Thyrotoxicosis, unspecified without thyrotoxic crisis or storm: Secondary | ICD-10-CM | POA: Diagnosis not present

## 2016-06-05 DIAGNOSIS — E039 Hypothyroidism, unspecified: Secondary | ICD-10-CM | POA: Diagnosis not present

## 2016-06-09 ENCOUNTER — Other Ambulatory Visit: Payer: Medicare Other

## 2016-06-14 DIAGNOSIS — N3010 Interstitial cystitis (chronic) without hematuria: Secondary | ICD-10-CM | POA: Diagnosis not present

## 2016-06-18 DIAGNOSIS — M25562 Pain in left knee: Secondary | ICD-10-CM | POA: Diagnosis not present

## 2016-06-18 DIAGNOSIS — M1288 Other specific arthropathies, not elsewhere classified, other specified site: Secondary | ICD-10-CM | POA: Diagnosis not present

## 2016-06-18 DIAGNOSIS — M48062 Spinal stenosis, lumbar region with neurogenic claudication: Secondary | ICD-10-CM | POA: Diagnosis not present

## 2016-06-18 DIAGNOSIS — M25561 Pain in right knee: Secondary | ICD-10-CM | POA: Diagnosis not present

## 2016-06-19 ENCOUNTER — Ambulatory Visit
Admission: RE | Admit: 2016-06-19 | Discharge: 2016-06-19 | Disposition: A | Discharge status: Home / Self Care | Payer: Medicare Other | Source: Ambulatory Visit | Attending: Orthopedic Surgery | Admitting: Orthopedic Surgery

## 2016-06-19 DIAGNOSIS — M48061 Spinal stenosis, lumbar region without neurogenic claudication: Secondary | ICD-10-CM | POA: Diagnosis not present

## 2016-06-19 DIAGNOSIS — M48062 Spinal stenosis, lumbar region with neurogenic claudication: Secondary | ICD-10-CM

## 2016-06-22 DIAGNOSIS — F411 Generalized anxiety disorder: Secondary | ICD-10-CM | POA: Diagnosis not present

## 2016-06-26 DIAGNOSIS — H6522 Chronic serous otitis media, left ear: Secondary | ICD-10-CM | POA: Diagnosis not present

## 2016-06-26 DIAGNOSIS — J320 Chronic maxillary sinusitis: Secondary | ICD-10-CM | POA: Diagnosis not present

## 2016-06-26 DIAGNOSIS — H9312 Tinnitus, left ear: Secondary | ICD-10-CM | POA: Diagnosis not present

## 2016-06-26 DIAGNOSIS — J322 Chronic ethmoidal sinusitis: Secondary | ICD-10-CM | POA: Diagnosis not present

## 2016-07-02 DIAGNOSIS — M4150 Other secondary scoliosis, site unspecified: Secondary | ICD-10-CM | POA: Diagnosis not present

## 2016-07-02 DIAGNOSIS — M48062 Spinal stenosis, lumbar region with neurogenic claudication: Secondary | ICD-10-CM | POA: Diagnosis not present

## 2016-07-03 DIAGNOSIS — H8110 Benign paroxysmal vertigo, unspecified ear: Secondary | ICD-10-CM | POA: Diagnosis not present

## 2016-07-03 DIAGNOSIS — N3010 Interstitial cystitis (chronic) without hematuria: Secondary | ICD-10-CM | POA: Diagnosis not present

## 2016-07-06 DIAGNOSIS — F411 Generalized anxiety disorder: Secondary | ICD-10-CM | POA: Diagnosis not present

## 2016-07-10 DIAGNOSIS — N3010 Interstitial cystitis (chronic) without hematuria: Secondary | ICD-10-CM | POA: Diagnosis not present

## 2016-07-10 DIAGNOSIS — N3020 Other chronic cystitis without hematuria: Secondary | ICD-10-CM | POA: Diagnosis not present

## 2016-07-10 DIAGNOSIS — M797 Fibromyalgia: Secondary | ICD-10-CM | POA: Diagnosis not present

## 2016-07-10 DIAGNOSIS — R300 Dysuria: Secondary | ICD-10-CM | POA: Diagnosis not present

## 2016-07-11 DIAGNOSIS — R42 Dizziness and giddiness: Secondary | ICD-10-CM | POA: Diagnosis not present

## 2016-07-11 DIAGNOSIS — H903 Sensorineural hearing loss, bilateral: Secondary | ICD-10-CM | POA: Diagnosis not present

## 2016-07-11 DIAGNOSIS — H9313 Tinnitus, bilateral: Secondary | ICD-10-CM | POA: Diagnosis not present

## 2016-07-12 DIAGNOSIS — N3010 Interstitial cystitis (chronic) without hematuria: Secondary | ICD-10-CM | POA: Diagnosis not present

## 2016-07-12 DIAGNOSIS — F411 Generalized anxiety disorder: Secondary | ICD-10-CM | POA: Diagnosis not present

## 2016-07-12 DIAGNOSIS — N3000 Acute cystitis without hematuria: Secondary | ICD-10-CM | POA: Diagnosis not present

## 2016-07-17 ENCOUNTER — Other Ambulatory Visit: Payer: Self-pay | Admitting: Urology

## 2016-07-18 DIAGNOSIS — N3000 Acute cystitis without hematuria: Secondary | ICD-10-CM | POA: Diagnosis not present

## 2016-07-19 DIAGNOSIS — H9313 Tinnitus, bilateral: Secondary | ICD-10-CM | POA: Diagnosis not present

## 2016-07-19 DIAGNOSIS — N3000 Acute cystitis without hematuria: Secondary | ICD-10-CM | POA: Diagnosis not present

## 2016-07-19 DIAGNOSIS — H903 Sensorineural hearing loss, bilateral: Secondary | ICD-10-CM | POA: Diagnosis not present

## 2016-07-19 DIAGNOSIS — J301 Allergic rhinitis due to pollen: Secondary | ICD-10-CM | POA: Diagnosis not present

## 2016-07-19 DIAGNOSIS — F411 Generalized anxiety disorder: Secondary | ICD-10-CM | POA: Diagnosis not present

## 2016-07-19 DIAGNOSIS — H8110 Benign paroxysmal vertigo, unspecified ear: Secondary | ICD-10-CM | POA: Diagnosis not present

## 2016-07-20 DIAGNOSIS — N3020 Other chronic cystitis without hematuria: Secondary | ICD-10-CM | POA: Diagnosis not present

## 2016-07-25 DIAGNOSIS — N3000 Acute cystitis without hematuria: Secondary | ICD-10-CM | POA: Diagnosis not present

## 2016-07-26 DIAGNOSIS — N3020 Other chronic cystitis without hematuria: Secondary | ICD-10-CM | POA: Diagnosis not present

## 2016-07-27 DIAGNOSIS — N3000 Acute cystitis without hematuria: Secondary | ICD-10-CM | POA: Diagnosis not present

## 2016-07-31 DIAGNOSIS — N3010 Interstitial cystitis (chronic) without hematuria: Secondary | ICD-10-CM | POA: Diagnosis not present

## 2016-08-01 DIAGNOSIS — N3010 Interstitial cystitis (chronic) without hematuria: Secondary | ICD-10-CM | POA: Diagnosis not present

## 2016-08-02 DIAGNOSIS — N3010 Interstitial cystitis (chronic) without hematuria: Secondary | ICD-10-CM | POA: Diagnosis not present

## 2016-08-02 DIAGNOSIS — M48062 Spinal stenosis, lumbar region with neurogenic claudication: Secondary | ICD-10-CM | POA: Diagnosis not present

## 2016-08-03 DIAGNOSIS — N3020 Other chronic cystitis without hematuria: Secondary | ICD-10-CM | POA: Diagnosis not present

## 2016-08-08 DIAGNOSIS — F411 Generalized anxiety disorder: Secondary | ICD-10-CM | POA: Diagnosis not present

## 2016-08-09 DIAGNOSIS — E559 Vitamin D deficiency, unspecified: Secondary | ICD-10-CM | POA: Diagnosis not present

## 2016-08-09 DIAGNOSIS — M9983 Other biomechanical lesions of lumbar region: Secondary | ICD-10-CM | POA: Diagnosis not present

## 2016-08-09 DIAGNOSIS — E039 Hypothyroidism, unspecified: Secondary | ICD-10-CM | POA: Diagnosis not present

## 2016-08-09 DIAGNOSIS — E7800 Pure hypercholesterolemia, unspecified: Secondary | ICD-10-CM | POA: Diagnosis not present

## 2016-08-09 DIAGNOSIS — Z79899 Other long term (current) drug therapy: Secondary | ICD-10-CM | POA: Diagnosis not present

## 2016-08-09 DIAGNOSIS — I10 Essential (primary) hypertension: Secondary | ICD-10-CM | POA: Diagnosis not present

## 2016-08-09 DIAGNOSIS — N3020 Other chronic cystitis without hematuria: Secondary | ICD-10-CM | POA: Diagnosis not present

## 2016-08-09 DIAGNOSIS — Z23 Encounter for immunization: Secondary | ICD-10-CM | POA: Diagnosis not present

## 2016-08-10 ENCOUNTER — Other Ambulatory Visit: Payer: Self-pay | Admitting: Family Medicine

## 2016-08-10 DIAGNOSIS — N3000 Acute cystitis without hematuria: Secondary | ICD-10-CM | POA: Diagnosis not present

## 2016-08-10 DIAGNOSIS — R109 Unspecified abdominal pain: Secondary | ICD-10-CM

## 2016-08-15 DIAGNOSIS — N3000 Acute cystitis without hematuria: Secondary | ICD-10-CM | POA: Diagnosis not present

## 2016-08-20 ENCOUNTER — Encounter (HOSPITAL_BASED_OUTPATIENT_CLINIC_OR_DEPARTMENT_OTHER): Payer: Self-pay | Admitting: *Deleted

## 2016-08-20 NOTE — Progress Notes (Signed)
NPO AFTER MN.  ARRIVE AT 0900.  NEEDS ISTAT 8.  CURRENT EKG IN CHART AND EPIC.  WILL TAKE PRILOSEC, BYSTOLIC, AND CARAFATE AM DOS W/ SIPS OF WATER. PT TO BRING SELF CATH SUPPLIES.

## 2016-08-21 DIAGNOSIS — N3020 Other chronic cystitis without hematuria: Secondary | ICD-10-CM | POA: Diagnosis not present

## 2016-08-21 DIAGNOSIS — M1711 Unilateral primary osteoarthritis, right knee: Secondary | ICD-10-CM | POA: Diagnosis not present

## 2016-08-22 ENCOUNTER — Other Ambulatory Visit: Payer: Self-pay | Admitting: Orthopedic Surgery

## 2016-08-22 ENCOUNTER — Other Ambulatory Visit: Payer: Self-pay | Admitting: Internal Medicine

## 2016-08-22 DIAGNOSIS — M1711 Unilateral primary osteoarthritis, right knee: Secondary | ICD-10-CM

## 2016-08-28 DIAGNOSIS — N3000 Acute cystitis without hematuria: Secondary | ICD-10-CM | POA: Diagnosis not present

## 2016-08-28 DIAGNOSIS — F411 Generalized anxiety disorder: Secondary | ICD-10-CM | POA: Diagnosis not present

## 2016-08-30 ENCOUNTER — Encounter (HOSPITAL_BASED_OUTPATIENT_CLINIC_OR_DEPARTMENT_OTHER)
Admission: RE | Disposition: A | Discharge status: Home / Self Care | Payer: Self-pay | Source: Ambulatory Visit | Attending: Urology

## 2016-08-30 ENCOUNTER — Encounter (HOSPITAL_BASED_OUTPATIENT_CLINIC_OR_DEPARTMENT_OTHER): Payer: Self-pay

## 2016-08-30 ENCOUNTER — Ambulatory Visit (HOSPITAL_BASED_OUTPATIENT_CLINIC_OR_DEPARTMENT_OTHER)
Admission: RE | Admit: 2016-08-30 | Discharge: 2016-08-30 | Disposition: A | Discharge status: Home / Self Care | Payer: Medicare Other | Source: Ambulatory Visit | Attending: Urology | Admitting: Urology

## 2016-08-30 ENCOUNTER — Ambulatory Visit (HOSPITAL_BASED_OUTPATIENT_CLINIC_OR_DEPARTMENT_OTHER): Payer: Medicare Other | Admitting: Anesthesiology

## 2016-08-30 DIAGNOSIS — I10 Essential (primary) hypertension: Secondary | ICD-10-CM | POA: Diagnosis not present

## 2016-08-30 DIAGNOSIS — Z880 Allergy status to penicillin: Secondary | ICD-10-CM | POA: Diagnosis not present

## 2016-08-30 DIAGNOSIS — Z87440 Personal history of urinary (tract) infections: Secondary | ICD-10-CM | POA: Insufficient documentation

## 2016-08-30 DIAGNOSIS — M797 Fibromyalgia: Secondary | ICD-10-CM | POA: Diagnosis not present

## 2016-08-30 DIAGNOSIS — F1120 Opioid dependence, uncomplicated: Secondary | ICD-10-CM | POA: Insufficient documentation

## 2016-08-30 DIAGNOSIS — K589 Irritable bowel syndrome without diarrhea: Secondary | ICD-10-CM | POA: Diagnosis not present

## 2016-08-30 DIAGNOSIS — Z79899 Other long term (current) drug therapy: Secondary | ICD-10-CM | POA: Insufficient documentation

## 2016-08-30 DIAGNOSIS — N3010 Interstitial cystitis (chronic) without hematuria: Secondary | ICD-10-CM | POA: Diagnosis not present

## 2016-08-30 DIAGNOSIS — R52 Pain, unspecified: Secondary | ICD-10-CM

## 2016-08-30 DIAGNOSIS — G4730 Sleep apnea, unspecified: Secondary | ICD-10-CM | POA: Insufficient documentation

## 2016-08-30 DIAGNOSIS — K219 Gastro-esophageal reflux disease without esophagitis: Secondary | ICD-10-CM | POA: Diagnosis not present

## 2016-08-30 HISTORY — DX: Low back pain: M54.5

## 2016-08-30 HISTORY — DX: Benign paroxysmal vertigo, unspecified ear: H81.10

## 2016-08-30 HISTORY — PX: CYSTO WITH HYDRODISTENSION: SHX5453

## 2016-08-30 HISTORY — DX: Spinal stenosis, lumbar region with neurogenic claudication: M48.062

## 2016-08-30 HISTORY — DX: Unspecified tear of unspecified meniscus, current injury, left knee, initial encounter: S83.207A

## 2016-08-30 HISTORY — DX: Other chronic pain: G89.29

## 2016-08-30 HISTORY — DX: Low back pain, unspecified: M54.50

## 2016-08-30 LAB — POCT I-STAT, CHEM 8
BUN: 16 mg/dL (ref 6–20)
Calcium, Ion: 1.22 mmol/L (ref 1.15–1.40)
Chloride: 106 mmol/L (ref 101–111)
Creatinine, Ser: 0.7 mg/dL (ref 0.44–1.00)
Glucose, Bld: 102 mg/dL — ABNORMAL HIGH (ref 65–99)
HCT: 35 % — ABNORMAL LOW (ref 36.0–46.0)
Hemoglobin: 11.9 g/dL — ABNORMAL LOW (ref 12.0–15.0)
Potassium: 4.1 mmol/L (ref 3.5–5.1)
Sodium: 142 mmol/L (ref 135–145)
TCO2: 26 mmol/L (ref 0–100)

## 2016-08-30 SURGERY — CYSTOSCOPY, WITH BLADDER HYDRODISTENSION
Anesthesia: General

## 2016-08-30 MED ORDER — OXYCODONE HCL 5 MG/5ML PO SOLN
5.0000 mg | Freq: Once | ORAL | Status: DC | PRN
  Filled 2016-08-30: qty 5

## 2016-08-30 MED ORDER — ONDANSETRON HCL 4 MG/2ML IJ SOLN
INTRAMUSCULAR | Status: DC | PRN
  Administered 2016-08-30: 4 mg via INTRAVENOUS

## 2016-08-30 MED ORDER — ACETAMINOPHEN 500 MG PO TABS
ORAL_TABLET | ORAL | Status: AC
  Filled 2016-08-30: qty 2

## 2016-08-30 MED ORDER — BELLADONNA ALKALOIDS-OPIUM 16.2-60 MG RE SUPP
RECTAL | Status: DC | PRN
  Administered 2016-08-30: 1 via RECTAL

## 2016-08-30 MED ORDER — SUGAMMADEX SODIUM 200 MG/2ML IV SOLN
INTRAVENOUS | Status: DC | PRN
  Administered 2016-08-30: 400 mg via INTRAVENOUS

## 2016-08-30 MED ORDER — ACETAMINOPHEN 500 MG PO TABS
1000.0000 mg | ORAL_TABLET | ORAL | Status: AC
  Administered 2016-08-30: 1000 mg via ORAL
  Filled 2016-08-30: qty 2

## 2016-08-30 MED ORDER — MEPERIDINE HCL 25 MG/ML IJ SOLN
6.2500 mg | INTRAMUSCULAR | Status: DC | PRN
  Filled 2016-08-30: qty 1

## 2016-08-30 MED ORDER — MIDAZOLAM HCL 5 MG/5ML IJ SOLN
INTRAMUSCULAR | Status: DC | PRN
  Administered 2016-08-30: 2 mg via INTRAVENOUS

## 2016-08-30 MED ORDER — LIDOCAINE 2% (20 MG/ML) 5 ML SYRINGE
INTRAMUSCULAR | Status: DC | PRN
  Administered 2016-08-30: 60 mg via INTRAVENOUS

## 2016-08-30 MED ORDER — BELLADONNA ALKALOIDS-OPIUM 16.2-60 MG RE SUPP
RECTAL | Status: AC
  Filled 2016-08-30: qty 1

## 2016-08-30 MED ORDER — SUCCINYLCHOLINE CHLORIDE 20 MG/ML IJ SOLN
INTRAMUSCULAR | Status: DC | PRN
  Administered 2016-08-30: 100 mg via INTRAVENOUS

## 2016-08-30 MED ORDER — DEXAMETHASONE SODIUM PHOSPHATE 4 MG/ML IJ SOLN
INTRAMUSCULAR | Status: DC | PRN
  Administered 2016-08-30: 10 mg via INTRAVENOUS

## 2016-08-30 MED ORDER — PHENAZOPYRIDINE HCL 200 MG PO TABS
ORAL_TABLET | ORAL | Status: DC | PRN
  Administered 2016-08-30: 15 mL via INTRAVESICAL

## 2016-08-30 MED ORDER — PROPOFOL 10 MG/ML IV BOLUS
INTRAVENOUS | Status: DC | PRN
  Administered 2016-08-30: 160 mg via INTRAVENOUS

## 2016-08-30 MED ORDER — ONDANSETRON HCL 4 MG/2ML IJ SOLN
INTRAMUSCULAR | Status: AC
  Filled 2016-08-30: qty 2

## 2016-08-30 MED ORDER — PROPOFOL 10 MG/ML IV BOLUS
INTRAVENOUS | Status: AC
  Filled 2016-08-30: qty 20

## 2016-08-30 MED ORDER — LACTATED RINGERS IV SOLN
INTRAVENOUS | Status: DC
  Filled 2016-08-30: qty 1000

## 2016-08-30 MED ORDER — FENTANYL CITRATE (PF) 100 MCG/2ML IJ SOLN
25.0000 ug | INTRAMUSCULAR | Status: DC | PRN
  Filled 2016-08-30: qty 1

## 2016-08-30 MED ORDER — LACTATED RINGERS IV SOLN
INTRAVENOUS | Status: DC
  Administered 2016-08-30: 10:00:00 via INTRAVENOUS
  Filled 2016-08-30: qty 1000

## 2016-08-30 MED ORDER — OXYCODONE HCL 5 MG PO TABS
5.0000 mg | ORAL_TABLET | Freq: Once | ORAL | Status: DC | PRN
  Filled 2016-08-30: qty 1

## 2016-08-30 MED ORDER — ROCURONIUM BROMIDE 100 MG/10ML IV SOLN
INTRAVENOUS | Status: DC | PRN
  Administered 2016-08-30: 30 mg via INTRAVENOUS

## 2016-08-30 MED ORDER — GENTAMICIN SULFATE 40 MG/ML IJ SOLN: 5.0000 mg/kg | INTRAMUSCULAR | Status: DC

## 2016-08-30 MED ORDER — SCOPOLAMINE 1 MG/3DAYS TD PT72
MEDICATED_PATCH | TRANSDERMAL | Status: AC
  Filled 2016-08-30: qty 1

## 2016-08-30 MED ORDER — HYDROCODONE-ACETAMINOPHEN 7.5-500 MG/15ML PO SOLN: 15.0000 mL | Freq: Two times a day (BID) | ORAL | 0 refills | Status: AC | PRN

## 2016-08-30 MED ORDER — PROMETHAZINE HCL 25 MG/ML IJ SOLN
6.2500 mg | INTRAMUSCULAR | Status: DC | PRN
  Filled 2016-08-30: qty 1

## 2016-08-30 MED ORDER — MIDAZOLAM HCL 2 MG/2ML IJ SOLN
INTRAMUSCULAR | Status: AC
  Filled 2016-08-30: qty 2

## 2016-08-30 MED ORDER — GENTAMICIN SULFATE 40 MG/ML IJ SOLN
5.0000 mg/kg | INTRAVENOUS | Status: AC
  Administered 2016-08-30: 340 mg via INTRAVENOUS
  Filled 2016-08-30 (×2): qty 8.5

## 2016-08-30 SURGICAL SUPPLY — 28 items
ADAPTER CATH WHT DISP STRL (CATHETERS) IMPLANT
ADPR CATH MP STRL LF DISP BD (CATHETERS)
BAG DRAIN URO-CYSTO SKYTR STRL (DRAIN) ×2 IMPLANT
BAG DRN UROCATH (DRAIN) ×1
BOOTIES KNEE HIGH SLOAN (MISCELLANEOUS) ×2 IMPLANT
CATH ROBINSON RED A/P 16FR (CATHETERS) IMPLANT
CLOTH BEACON ORANGE TIMEOUT ST (SAFETY) ×2 IMPLANT
ELECT REM PT RETURN 9FT ADLT (ELECTROSURGICAL) ×2
ELECTRODE REM PT RTRN 9FT ADLT (ELECTROSURGICAL) ×1 IMPLANT
GLOVE BIO SURGEON STRL SZ7.5 (GLOVE) ×2 IMPLANT
GLOVE ECLIPSE 8.0 STRL XLNG CF (GLOVE) IMPLANT
GOWN STRL REUS W/ TWL LRG LVL3 (GOWN DISPOSABLE) ×1 IMPLANT
GOWN STRL REUS W/ TWL XL LVL3 (GOWN DISPOSABLE) ×1 IMPLANT
GOWN STRL REUS W/TWL LRG LVL3 (GOWN DISPOSABLE) ×2
GOWN STRL REUS W/TWL XL LVL3 (GOWN DISPOSABLE) ×2
KIT RM TURNOVER CYSTO AR (KITS) ×2 IMPLANT
MANIFOLD NEPTUNE II (INSTRUMENTS) ×1 IMPLANT
NDL SAFETY ECLIPSE 18X1.5 (NEEDLE) ×1 IMPLANT
NDL SPNL 22GX7 QUINCKE BK (NEEDLE) IMPLANT
NEEDLE HYPO 18GX1.5 SHARP (NEEDLE) ×2
NEEDLE HYPO 22GX1.5 SAFETY (NEEDLE) ×1 IMPLANT
NEEDLE SPNL 22GX7 QUINCKE BK (NEEDLE) IMPLANT
NS IRRIG 500ML POUR BTL (IV SOLUTION) ×1 IMPLANT
PACK CYSTO (CUSTOM PROCEDURE TRAY) ×2 IMPLANT
SYR 20CC LL (SYRINGE) ×4 IMPLANT
SYR BULB IRRIGATION 50ML (SYRINGE) IMPLANT
TUBE CONNECTING 12X1/4 (SUCTIONS) IMPLANT
WATER STERILE IRR 3000ML UROMA (IV SOLUTION) ×2 IMPLANT

## 2016-08-30 NOTE — Discharge Instructions (Signed)
°  Post Anesthesia Home Care Instructions ° °Activity: °Get plenty of rest for the remainder of the day. A responsible individual must stay with you for 24 hours following the procedure.  °For the next 24 hours, DO NOT: °-Drive a car °-Operate machinery °-Drink alcoholic beverages °-Take any medication unless instructed by your physician °-Make any legal decisions or sign important papers. ° °Meals: °Start with liquid foods such as gelatin or soup. Progress to regular foods as tolerated. Avoid greasy, spicy, heavy foods. If nausea and/or vomiting occur, drink only clear liquids until the nausea and/or vomiting subsides. Call your physician if vomiting continues. ° °Special Instructions/Symptoms: °Your throat may feel dry or sore from the anesthesia or the breathing tube placed in your throat during surgery. If this causes discomfort, gargle with warm salt water. The discomfort should disappear within 24 hours. ° °If you had a scopolamine patch placed behind your ear for the management of post- operative nausea and/or vomiting: ° °1. The medication in the patch is effective for 72 hours, after which it should be removed.  Wrap patch in a tissue and discard in the trash. Wash hands thoroughly with soap and water. °2. You may remove the patch earlier than 72 hours if you experience unpleasant side effects which may include dry mouth, dizziness or visual disturbances. °3. Avoid touching the patch. Wash your hands with soap and water after contact with the patch. °  °CYSTOSCOPY HOME CARE INSTRUCTIONS ° °Activity: °Rest for the remainder of the day.  Do not drive or operate equipment today.  You may resume normal activities in one to two days as instructed by your physician.  ° °Meals: °Drink plenty of liquids and eat light foods such as gelatin or soup this evening.  You may return to a normal meal plan tomorrow. ° °Return to Work: °You may return to work in one to two days or as instructed by your physician. ° °Special  Instructions / Symptoms: °Call your physician if any of these symptoms occur: ° ° -persistent or heavy bleeding ° -bleeding which continues after first few urination ° -large blood clots that are difficult to pass ° -urine stream diminishes or stops completely ° -fever equal to or higher than 101 degrees Farenheit. ° -cloudy urine with a strong, foul odor ° -severe pain ° °Females should always wipe from front to back after elimination.  You may feel some burning pain when you urinate.  This should disappear with time.  Applying moist heat to the lower abdomen or a hot tub bath may help relieve the pain. \ ° °Follow-Up / Date of Return Visit to Your Physician:   ° °Call for an appointment to arrange follow-up. ° °Patient Signature:  ________________________________________________________ ° °Nurse's Signature:  ________________________________________________________ ° °

## 2016-08-30 NOTE — Anesthesia Preprocedure Evaluation (Addendum)
Anesthesia Evaluation  Patient identified by MRN, date of birth, ID band Patient awake    Reviewed: Allergy & Precautions, NPO status , Patient's Chart, lab work & pertinent test results, reviewed documented beta blocker date and time   History of Anesthesia Complications (+) PONV and history of anesthetic complications  Airway Mallampati: III  TM Distance: >3 FB Neck ROM: Full  Mouth opening: Limited Mouth Opening  Dental  (+) Teeth Intact, Dental Advisory Given, Missing, Partial Upper,    Pulmonary sleep apnea ,    breath sounds clear to auscultation       Cardiovascular hypertension, Pt. on home beta blockers  Rhythm:Regular Rate:Normal     Neuro/Psych  Neuromuscular disease negative psych ROS   GI/Hepatic Neg liver ROS, hiatal hernia, GERD  Medicated,  Endo/Other  negative endocrine ROS  Renal/GU negative Renal ROS  negative genitourinary   Musculoskeletal  (+) Fibromyalgia -, narcotic dependent  Abdominal   Peds negative pediatric ROS (+)  Hematology negative hematology ROS (+)   Anesthesia Other Findings   Reproductive/Obstetrics negative OB ROS                           Anesthesia Physical Anesthesia Plan  ASA: II  Anesthesia Plan: General   Post-op Pain Management:    Induction: Intravenous  Airway Management Planned: Oral ETT  Additional Equipment:   Intra-op Plan:   Post-operative Plan: Extubation in OR  Informed Consent: I have reviewed the patients History and Physical, chart, labs and discussed the procedure including the risks, benefits and alternatives for the proposed anesthesia with the patient or authorized representative who has indicated his/her understanding and acceptance.   Dental advisory given  Plan Discussed with: CRNA  Anesthesia Plan Comments:        Anesthesia Quick Evaluation

## 2016-08-30 NOTE — Transfer of Care (Signed)
Immediate Anesthesia Transfer of Care Note  Patient: Jennifer ReamerBarbara E [name]  Procedure(s) Performed: Procedure(s): CYSTOSCOPY/HYDRODISTENSION OF BLADDER (N/A)  Patient Location: PACU  Anesthesia Type:General  Level of Consciousness: awake, alert , oriented and patient cooperative  Airway & Oxygen Therapy: Patient Spontanous Breathing and Patient connected to nasal cannula oxygen  Post-op Assessment: Report given to RN and Post -op Vital signs reviewed and stable  Post vital signs: Reviewed and stable  Last Vitals:  Vitals:   08/30/16 0849  BP: (!) 155/94  Pulse: 60  Resp: 19  Temp: 36.9 C    Last Pain:  Vitals:   08/30/16 0849  TempSrc: Oral      Patients Stated Pain Goal: 8 (08/30/16 0927)  Complications: No apparent anesthesia complications

## 2016-08-30 NOTE — Anesthesia Procedure Notes (Signed)
Procedure Name: Intubation Date/Time: 08/30/2016 10:33 AM Performed by: Wanita Chamberlain Pre-anesthesia Checklist: Patient identified, Timeout performed, Emergency Drugs available, Suction available and Patient being monitored Patient Re-evaluated:Patient Re-evaluated prior to inductionOxygen Delivery Method: Circle system utilized Preoxygenation: Pre-oxygenation with 100% oxygen Intubation Type: IV induction Ventilation: Mask ventilation without difficulty Laryngoscope Size: Mac and 3 Grade View: Grade II Tube type: Oral Tube size: 7.0 mm Number of attempts: 1 Placement Confirmation: ETT inserted through vocal cords under direct vision,  positive ETCO2 and breath sounds checked- equal and bilateral Secured at: 22 cm Tube secured with: Tape Dental Injury: Teeth and Oropharynx as per pre-operative assessment  Difficulty Due To: Difficulty was anticipated

## 2016-08-30 NOTE — H&P (Signed)
Office Visit Report     11/18/2015   --------------------------------------------------------------------------------   Jennifer [name]  MRN: [id number]  PRIMARY CARE:  Anna Genre. Little, MD  DOB: 11/27/1945, 72 year old Female  REFERRING:  Reign Dziuba I. Patsi Sears, MD    PROVIDER:  Jethro Bolus, M.D.    LOCATION:  [facility] 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  HPI: Jennifer [name] is a 71 year-old female established patient who is here for interstitial cystitis.  She is currently having trouble urinating. She does have a burning sensation when she urinates. She does have to strain or bear down to start her urinary stream. She is having problems with emptying her bladder well.   She is not having problems with urinary control or incontinence. She is not incontinent immediately following the strong urge to urinate. She is not urinating more frequently now than usual.   She does not dribble at the end of urination. She does not have pelvic or rectal pain related to voiding.   interstitial cystitis diagnosed 19 T4, status post ceco-cystoplasty [place]. Patient has had continued pelvic pain, and bladder pain, requiring cystoscopy. She has had contracture of anastomosis, requiring hydro-distention periodically. She has had self intermittent catheterizations for 25 years. She returns for when necessary [place] on bladder instillations when necessary.     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  HPI: She does not have a burning sensation when she urinates. She does have to strain or bear down to start her urinary stream. She is having problems getting her urine stream started. She is currently having trouble urinating. She is having problems with emptying her bladder well.   She is not having problems with urinary control or incontinence. She is not urinating more frequently now than  usual.   She does have pelvic or rectal pain related to voiding.   ileal ceco-cystoplasty 1984 [place] [address]. Recurrent urinary tract infections, most recently with Escherichia coli in 2016 treated with Macrodantin; and subsequently with gentamicin. Proteus urinary tract infection in September 2016 treated with Keflex. Escherichia coli urinary tract infection in October 2016 54.   Most recent Escherichia coli urinary tract infection treated with gentamicin bladder irrigations. Patient seen in consultation by Dr. Ninetta Lights (infectious disease) with recommendation patient to avoid treatment unless life-threatening infection. Patient resistant to second opinion consultation ID. Rose recently on Augmentin for ENT infection, with resulting diarrhea.     ALLERGIES: Amoxicillin TABS Aspirin TABS Augmentin TABS Avelox TABS Codeine Derivatives Dimethyl Sulfoxide SOLN Food Dye Macrodantin CAPS Sulfa Drugs    MEDICATIONS: Allegra 60 MG TABS Oral  Bystolic 10 MG Oral Tablet Oral  Cephalexin 500 MG Oral Capsule 1 Oral Twice daily  Elmiron 100 MG Oral Capsule 0 Oral  Gaviscon CHEW Oral  Hydrocodone-Acetaminophen 7.5-325 MG Oral Tablet Oral  Hyophen 81.6 mg-10.8 mg-9 mg-36.2 mg-0.12 mg tablet 1 tablet PO QID  Methenamine Hippurate 1 gram tablet 1 tablet PO Daily  Methenamine Hippurate 1 GM Oral Tablet 0 Oral  Omeprazole 20 MG Oral Capsule Delayed Release Oral  Sucralfate TABS Oral  Uribel 118 mg-10 mg-40.8 mg-36 mg-0.12 mg capsule 1 capsule PO QID  Urogesic-Blue 81.6 mg-40.8 mg-10.8 mg-0.12 mg tablet 1 tablet PO TID  Urogesic-Blue 81.6 MG Oral Tablet 1 Oral 4 times daily  Utira-C 81.6 mg-10.8 mg-40.8 mg-36.2 mg-0.12 mg tablet 1 tablet PO TID  Utira-C 81.6 MG Oral Tablet 1 Oral 3 times daily  Vitamin D TABS Oral  Xanax 0.5 MG Oral Tablet 0 Oral     GU PSH: Catheterize For Residual - 09/14/2015 Cystoscopy And Treatment - 2007 Cystoscopy Hydrodistention - 2011 Exam/Biopsy Vag W/Scope -  2007 Hysterectomy Unilat SO - 2007 Revise Bladder & Bowel - 2007      PSH Notes: Oral Surgery, Bladder Irrigation, Cystoscopy With Dilation Of Bladder, Bladder Enterocystoplasty, Colposcopy Vagina With Biopsy(S), Tonsillectomy, Cholecystectomy, Cystoscopy With Dilation Of Bladder Under Local Anesthesia, Hysterectomy, Neuroplasty Median Nerve At Carpal Tunnel, Sinus Surgery   NON-GU PSH: Carpal Tunnel Surgery - 2007 Cholecystectomy - 2007 Remove Tonsils - 2007    GU PMH: Recurrent Cystitis w/o hematuria - 09/23/2015, - 09/22/2015, - 6/19/2017Recurrent Cystitis w/o hematuria, Chronic cystitis - 06/22/2015 Interstitial Cystitis, chronic w/o hematuria, Chronic interstitial cystitis without hematuria - 06/22/2015 Postmenopausal atrophic vaginitis, Atrophic vaginitis - 06/22/2015 Acute Cystitis, Acute cystitis without hematuria - 01/10/2015, Acute Cystitis, - 2014 Kidney Stone, Nephrolithiasis - 01/10/2015 Abdominal Pain Unspec, Bilateral flank pain - 12/24/2014 Dysuria, Dysuria - 12/17/2014 Oth GU systems Signs/Symptoms, Bladder pain - 12/17/2014 Urinary Frequency, Increased urinary frequency - 11/09/2014 Candidiasis of vulva and vagina, Vaginal Candidiasis - 2014 Gross hematuria, Gross hematuria - 2014 Low back pain, Lower back pain - 2014 Urinary Tract Inf, Unspec site, Urinary tract infection - 2014      PMH Notes:  2006-03-22 11:25:28 - Note: Arthritis   NON-GU PMH: Encounter for general adult medical examination without abnormal findings, Encounter for preventive health examination - 09/01/2015 Personal history of other diseases of the circulatory system, History of hypertension - 2014 Personal history of other diseases of the digestive system, History of esophageal reflux - 2014 Personal history of other endocrine, nutritional and metabolic disease, History of hypercholesterolemia - 2014 Fibromyalgia    FAMILY HISTORY: Chronic Obstructive Pulmonary Disease - Runs In Family Coronary Artery  Disease - Runs In Family Death of family member - Runs In Family Emphysema - Runs In Family Hypertension - Runs In Family   SOCIAL HISTORY: Marital Status: Single Current Smoking Status: Patient has never smoked.  Has never drank.  Does not drink caffeine.     Notes: Never A Smoker, Alcohol Use, Marital History - Single, Occupation:, Caffeine Use   REVIEW OF SYSTEMS:    GU Review Female:   Patient reports burning /pain with urination and trouble starting your stream. Patient denies frequent urination, hard to postpone urination, get up at night to urinate, leakage of urine, stream starts and stops, have to strain to urinate, and currently pregnant.  Gastrointestinal (Upper):   Patient reports indigestion/ heartburn. Patient denies nausea and vomiting.  Gastrointestinal (Lower):   Patient reports diarrhea. Patient denies constipation.  Constitutional:   Patient reports fatigue. Patient denies fever, night sweats, and weight loss.  Skin:   Patient denies skin rash/ lesion and itching.  Eyes:   Patient denies blurred vision and double vision.  Ears/ Nose/ Throat:   Patient reports sinus problems. Patient denies sore throat.  Hematologic/Lymphatic:   Patient denies swollen glands and easy bruising.  Cardiovascular:   Patient denies leg swelling and chest pains.  Respiratory:   Patient denies cough and shortness of breath.  Endocrine:   Patient denies excessive thirst.  Musculoskeletal:   Patient reports back pain. Patient denies joint pain.  Neurological:   Patient denies headaches and dizziness.  Psychologic:   Patient reports depression and anxiety.    VITAL SIGNS: None   GU PHYSICAL EXAMINATION:    External Genitalia: No hirsuitism, no rash, no scarring, no cyst, no  erythematous lesion, no papular lesion, no blanched lesion, no warty lesion, no labial adhesions, no atrophic introitus. No edema.   Vagina: Moderate vaginal atrophy, mild introital stenosis. No rectocele. No cystocele. No  enterocele.    MULTI-SYSTEM PHYSICAL EXAMINATION:    Constitutional: Obese. Poor dentition. No physical deformities. Normally developed.   Neck: Neck symmetrical, not swollen. Normal tracheal position.  Respiratory: No labored breathing, no use of accessory muscles.   Cardiovascular: Normal temperature, normal extremity pulses, no swelling, no varicosities.  Lymphatic: No enlargement of neck, axillae, groin.  Skin: No paleness, no jaundice, no cyanosis. No lesion, no ulcer, no rash.  Neurologic / Psychiatric: Patient depressed, anxious. Oriented to time, oriented to place, oriented to person. No agitation.   Gastrointestinal: No mass, no tenderness, no rigidity, non obese abdomen.  Musculoskeletal: Normal gait and station of head and neck.     PAST DATA REVIEWED:  Source Of History:  Patient  Records Review:   Previous Patient Records  Urine Test Review:   Urinalysis, Urine Culture and Sensitivity   11/16/15  Urinalysis  Urine Appearance Clear   Urine Specimen Voided   Urine Color Yellow   Urine Glucose Neg   Urine Bilirubin Neg   Urine Ketones Neg   Urine Specific Gravity 1.025   Urine Blood Neg   Urine pH 5.0   Urine Protein Neg   Urine Urobilinogen 0.2   Urine Nitrites Neg   Urine Leukocyte Esterase Neg    PROCEDURES: None   ASSESSMENT:      ICD-10 Details  1 GU:   Interstitial Cystitis, chronic w/o hematuria - N30.10   2   Recurrent Cystitis w/o hematuria - N30.20   4   Dysuria - R30.0   5   Urinary Frequency - R35.0   3 NON-GU:   Fibromyalgia - M79.7           Notes:   complex 71 year old female post ileocecal cystoplasty in [place] for interstitial cystitis with small capacity bladder. She is on self intermittent catheterization. 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 She has high anxiety and depression. She was the sole  Support of her her mother until her recent death.    . In addition, the patient lost her  job, and lived out of a car, but is  Currently  living in an apartment, and try to get her life back together.    She has recurrent sinus infections, treated with antibiotics, with resultant diarrhea. This  make her more likely to have another urinary tract infection. She has recently been approved for Medicaid, which will help her with her pills, and help her with ability to buy food. She continues to look for job. She has returned to the office for multiple IC bladder treatments, and now is for cystoscopy, hydrodistention, which has helped her in the past.    PLAN:           Schedule Return Visit:- Extender          Document Letter(s):  Created for Patient: Clinical Summary         The information contained in this medical record document is considered private and confidential patient information. This information can only be used for the medical diagnosis and/or medical services that are being provided by the patient's selected caregivers. This information can only be distributed outside of the patient's care if the patient agrees and signs waivers of authorization for this information to be sent to  an outside source or route.

## 2016-08-30 NOTE — Op Note (Signed)
Pre-operative diagnosis : Interstitial Cystitis  Postoperative diagnosis:  Same  Operation: Cystourethroscopy, hydrodistention of bladder, instillation of peridium/Marcaine    Surgeon:  S. Patsi Searsannenbaum, MD  First assistant: None    Preparation: After appropriate preanesthesia, the patient was brought the abdomen, placed on the operating table in the dorsal supine position where general endotracheal anesthesia was introduced. (GERD). The history was reviewed. The arm and was double checked.   Review history:  Jennifer [name] is a 71 year-old female established patient who is here for interstitial cystitis.  She is currently having trouble urinating. She does have a burning sensation when she urinates. She does have to strain or bear down to start her urinary stream. She is having problems with emptying her bladder well.   She is not having problems with urinary control or incontinence. She is not incontinent immediately following the strong urge to urinate. She is not urinating more frequently now than usual.   She does not dribble at the end of urination. She does not have pelvic or rectal pain related to voiding.   interstitial cystitis diagnosed 19 T4, status post ceco-cystoplasty [place]. Patient has had continued pelvic pain, and bladder pain, requiring cystoscopy. She has had contracture of anastomosis, requiring hydro-distention periodically. She has had self intermittent catheterizations for 25 years. She returns  when necessary for Elmiron  bladder instillations.    Statement of  Likelihood of Success: Excellent. TIME-OUT observed.:  Procedure:  Suppository was placed, and Pyridium/ Marcaine solution was placed at the end the procedure. The patient was systematically hydrodistention from 700 mL 2 850 mL. Photo documentation was accomplished. The patient tolerated procedure well. She was awakened and taken to recovery room in good condition.

## 2016-08-30 NOTE — Interval H&P Note (Signed)
History and Physical Interval Note:  08/30/2016 10:02 AM  Jennifer [name]  has presented today for surgery, with the diagnosis of INTERSTILIAL CYSTITIS  The various methods of treatment have been discussed with the patient and family. After consideration of risks, benefits and other options for treatment, the patient has consented to  Procedure(s): CYSTOSCOPY/HYDRODISTENSION OF BLADDER (N/A) as a surgical intervention .  The patient's history has been reviewed, patient examined, no change in status, stable for surgery.  I have reviewed the patient's chart and labs.  Questions were answered to the patient's satisfaction.     Eaton Folmar I Sajan Cheatwood  Note: pt uis out of her pain med. Not due to go back to pain  clinic in [place] until next Wed. "Told" by pre-op nurse on phone to get carry-over pain med from "Dr. Karie Schwalbe". Discussed with pt. She has IC pain as well as chronic basck pain. She will limit herself to 2 pain tablets/day for 5 days. Will be given a scriot for 10 tabs.

## 2016-08-30 NOTE — Anesthesia Postprocedure Evaluation (Signed)
Anesthesia Post Note  Patient: Jennifer ReamerBarbara E Silvera  Procedure(s) Performed: Procedure(s) (LRB): CYSTOSCOPY/HYDRODISTENSION OF BLADDER INJECTION OF MARCAINE/PYRIDIUM (N/A)     Patient location during evaluation: PACU Anesthesia Type: General Level of consciousness: awake and alert Pain management: pain level controlled Vital Signs Assessment: post-procedure vital signs reviewed and stable Respiratory status: spontaneous breathing, nonlabored ventilation, respiratory function stable and patient connected to nasal cannula oxygen Cardiovascular status: blood pressure returned to baseline and stable Postop Assessment: no signs of nausea or vomiting Anesthetic complications: no    Last Vitals:  Vitals:   08/30/16 1145 08/30/16 1154  BP: (!) 144/73   Pulse: (!) 52 (!) 51  Resp: 16 13  Temp:                    Shelton SilvasKevin D Ashey Tramontana

## 2016-08-31 ENCOUNTER — Encounter (HOSPITAL_BASED_OUTPATIENT_CLINIC_OR_DEPARTMENT_OTHER): Payer: Self-pay | Admitting: Urology

## 2016-09-03 ENCOUNTER — Ambulatory Visit
Admission: RE | Admit: 2016-09-03 | Discharge: 2016-09-03 | Disposition: A | Discharge status: Home / Self Care | Payer: Medicare Other | Source: Ambulatory Visit | Attending: Orthopedic Surgery | Admitting: Orthopedic Surgery

## 2016-09-03 DIAGNOSIS — S81011A Laceration without foreign body, right knee, initial encounter: Secondary | ICD-10-CM | POA: Diagnosis not present

## 2016-09-03 DIAGNOSIS — M1711 Unilateral primary osteoarthritis, right knee: Secondary | ICD-10-CM

## 2016-09-03 DIAGNOSIS — M25561 Pain in right knee: Secondary | ICD-10-CM | POA: Diagnosis not present

## 2016-09-03 DIAGNOSIS — S8991XA Unspecified injury of right lower leg, initial encounter: Secondary | ICD-10-CM | POA: Diagnosis not present

## 2016-09-05 DIAGNOSIS — M48062 Spinal stenosis, lumbar region with neurogenic claudication: Secondary | ICD-10-CM | POA: Diagnosis not present

## 2016-09-05 DIAGNOSIS — F411 Generalized anxiety disorder: Secondary | ICD-10-CM | POA: Diagnosis not present

## 2016-09-11 DIAGNOSIS — N3010 Interstitial cystitis (chronic) without hematuria: Secondary | ICD-10-CM | POA: Diagnosis not present

## 2016-09-12 DIAGNOSIS — F411 Generalized anxiety disorder: Secondary | ICD-10-CM | POA: Diagnosis not present

## 2016-09-13 DIAGNOSIS — N3010 Interstitial cystitis (chronic) without hematuria: Secondary | ICD-10-CM | POA: Diagnosis not present

## 2016-09-19 DIAGNOSIS — F411 Generalized anxiety disorder: Secondary | ICD-10-CM | POA: Diagnosis not present

## 2016-09-20 DIAGNOSIS — N3010 Interstitial cystitis (chronic) without hematuria: Secondary | ICD-10-CM | POA: Diagnosis not present

## 2016-09-21 DIAGNOSIS — K219 Gastro-esophageal reflux disease without esophagitis: Secondary | ICD-10-CM | POA: Diagnosis not present

## 2016-09-21 DIAGNOSIS — Z79899 Other long term (current) drug therapy: Secondary | ICD-10-CM | POA: Diagnosis not present

## 2016-09-21 DIAGNOSIS — R110 Nausea: Secondary | ICD-10-CM | POA: Diagnosis not present

## 2016-09-21 DIAGNOSIS — R198 Other specified symptoms and signs involving the digestive system and abdomen: Secondary | ICD-10-CM | POA: Diagnosis not present

## 2016-09-21 DIAGNOSIS — Z1211 Encounter for screening for malignant neoplasm of colon: Secondary | ICD-10-CM | POA: Diagnosis not present

## 2016-09-25 DIAGNOSIS — M48062 Spinal stenosis, lumbar region with neurogenic claudication: Secondary | ICD-10-CM | POA: Diagnosis not present

## 2016-09-25 DIAGNOSIS — M47816 Spondylosis without myelopathy or radiculopathy, lumbar region: Secondary | ICD-10-CM | POA: Diagnosis not present

## 2016-09-25 DIAGNOSIS — M549 Dorsalgia, unspecified: Secondary | ICD-10-CM | POA: Diagnosis not present

## 2016-09-25 DIAGNOSIS — M5136 Other intervertebral disc degeneration, lumbar region: Secondary | ICD-10-CM | POA: Diagnosis not present

## 2016-09-25 DIAGNOSIS — M5416 Radiculopathy, lumbar region: Secondary | ICD-10-CM | POA: Diagnosis not present

## 2016-09-25 DIAGNOSIS — M41125 Adolescent idiopathic scoliosis, thoracolumbar region: Secondary | ICD-10-CM | POA: Diagnosis not present

## 2016-09-26 DIAGNOSIS — N3010 Interstitial cystitis (chronic) without hematuria: Secondary | ICD-10-CM | POA: Diagnosis not present

## 2016-09-27 DIAGNOSIS — F411 Generalized anxiety disorder: Secondary | ICD-10-CM | POA: Diagnosis not present

## 2016-09-28 DIAGNOSIS — N3020 Other chronic cystitis without hematuria: Secondary | ICD-10-CM | POA: Diagnosis not present

## 2016-10-02 DIAGNOSIS — N3020 Other chronic cystitis without hematuria: Secondary | ICD-10-CM | POA: Diagnosis not present

## 2016-10-04 DIAGNOSIS — M4690 Unspecified inflammatory spondylopathy, site unspecified: Secondary | ICD-10-CM | POA: Diagnosis not present

## 2016-10-04 DIAGNOSIS — M25561 Pain in right knee: Secondary | ICD-10-CM | POA: Diagnosis not present

## 2016-10-04 DIAGNOSIS — M25562 Pain in left knee: Secondary | ICD-10-CM | POA: Diagnosis not present

## 2016-10-04 DIAGNOSIS — M48062 Spinal stenosis, lumbar region with neurogenic claudication: Secondary | ICD-10-CM | POA: Diagnosis not present

## 2016-10-05 DIAGNOSIS — N3010 Interstitial cystitis (chronic) without hematuria: Secondary | ICD-10-CM | POA: Diagnosis not present

## 2016-10-08 DIAGNOSIS — M48062 Spinal stenosis, lumbar region with neurogenic claudication: Secondary | ICD-10-CM | POA: Diagnosis not present

## 2016-10-10 DIAGNOSIS — N3020 Other chronic cystitis without hematuria: Secondary | ICD-10-CM | POA: Diagnosis not present

## 2016-10-12 DIAGNOSIS — N3010 Interstitial cystitis (chronic) without hematuria: Secondary | ICD-10-CM | POA: Diagnosis not present

## 2016-10-18 DIAGNOSIS — N3020 Other chronic cystitis without hematuria: Secondary | ICD-10-CM | POA: Diagnosis not present

## 2016-10-18 DIAGNOSIS — F411 Generalized anxiety disorder: Secondary | ICD-10-CM | POA: Diagnosis not present

## 2016-10-30 DIAGNOSIS — N3010 Interstitial cystitis (chronic) without hematuria: Secondary | ICD-10-CM | POA: Diagnosis not present

## 2016-10-30 DIAGNOSIS — M1711 Unilateral primary osteoarthritis, right knee: Secondary | ICD-10-CM | POA: Diagnosis not present

## 2016-11-02 DIAGNOSIS — F411 Generalized anxiety disorder: Secondary | ICD-10-CM | POA: Diagnosis not present

## 2016-11-07 DIAGNOSIS — N3010 Interstitial cystitis (chronic) without hematuria: Secondary | ICD-10-CM | POA: Diagnosis not present

## 2016-11-08 DIAGNOSIS — F411 Generalized anxiety disorder: Secondary | ICD-10-CM | POA: Diagnosis not present

## 2016-11-14 DIAGNOSIS — N3000 Acute cystitis without hematuria: Secondary | ICD-10-CM | POA: Diagnosis not present

## 2016-11-15 DIAGNOSIS — F411 Generalized anxiety disorder: Secondary | ICD-10-CM | POA: Diagnosis not present

## 2016-11-21 DIAGNOSIS — N3000 Acute cystitis without hematuria: Secondary | ICD-10-CM | POA: Diagnosis not present

## 2016-11-22 DIAGNOSIS — N3010 Interstitial cystitis (chronic) without hematuria: Secondary | ICD-10-CM | POA: Diagnosis not present

## 2016-11-23 ENCOUNTER — Other Ambulatory Visit (HOSPITAL_COMMUNITY): Payer: Self-pay | Admitting: Physician Assistant

## 2016-11-23 DIAGNOSIS — R110 Nausea: Secondary | ICD-10-CM

## 2016-11-23 DIAGNOSIS — K219 Gastro-esophageal reflux disease without esophagitis: Secondary | ICD-10-CM | POA: Diagnosis not present

## 2016-11-23 DIAGNOSIS — N3000 Acute cystitis without hematuria: Secondary | ICD-10-CM | POA: Diagnosis not present

## 2016-11-23 DIAGNOSIS — N3010 Interstitial cystitis (chronic) without hematuria: Secondary | ICD-10-CM | POA: Diagnosis not present

## 2016-11-23 DIAGNOSIS — K591 Functional diarrhea: Secondary | ICD-10-CM | POA: Diagnosis not present

## 2016-11-23 DIAGNOSIS — Z79899 Other long term (current) drug therapy: Secondary | ICD-10-CM | POA: Diagnosis not present

## 2016-11-27 DIAGNOSIS — N3000 Acute cystitis without hematuria: Secondary | ICD-10-CM | POA: Diagnosis not present

## 2016-11-28 DIAGNOSIS — N3000 Acute cystitis without hematuria: Secondary | ICD-10-CM | POA: Diagnosis not present

## 2016-11-29 DIAGNOSIS — N3000 Acute cystitis without hematuria: Secondary | ICD-10-CM | POA: Diagnosis not present

## 2016-11-30 DIAGNOSIS — F411 Generalized anxiety disorder: Secondary | ICD-10-CM | POA: Diagnosis not present

## 2016-11-30 DIAGNOSIS — N3000 Acute cystitis without hematuria: Secondary | ICD-10-CM | POA: Diagnosis not present

## 2016-12-05 DIAGNOSIS — N3010 Interstitial cystitis (chronic) without hematuria: Secondary | ICD-10-CM | POA: Diagnosis not present

## 2016-12-06 ENCOUNTER — Encounter (HOSPITAL_COMMUNITY): Payer: Medicare Other

## 2016-12-06 DIAGNOSIS — G4730 Sleep apnea, unspecified: Secondary | ICD-10-CM | POA: Diagnosis not present

## 2016-12-06 DIAGNOSIS — M9983 Other biomechanical lesions of lumbar region: Secondary | ICD-10-CM | POA: Diagnosis not present

## 2016-12-06 DIAGNOSIS — Z6835 Body mass index (BMI) 35.0-35.9, adult: Secondary | ICD-10-CM | POA: Diagnosis not present

## 2016-12-06 DIAGNOSIS — E559 Vitamin D deficiency, unspecified: Secondary | ICD-10-CM | POA: Diagnosis not present

## 2016-12-06 DIAGNOSIS — I10 Essential (primary) hypertension: Secondary | ICD-10-CM | POA: Diagnosis not present

## 2016-12-06 DIAGNOSIS — E669 Obesity, unspecified: Secondary | ICD-10-CM | POA: Diagnosis not present

## 2016-12-06 DIAGNOSIS — N3011 Interstitial cystitis (chronic) with hematuria: Secondary | ICD-10-CM | POA: Diagnosis not present

## 2016-12-06 DIAGNOSIS — E039 Hypothyroidism, unspecified: Secondary | ICD-10-CM | POA: Diagnosis not present

## 2016-12-06 DIAGNOSIS — E7800 Pure hypercholesterolemia, unspecified: Secondary | ICD-10-CM | POA: Diagnosis not present

## 2016-12-07 ENCOUNTER — Encounter (HOSPITAL_COMMUNITY): Payer: Medicare Other

## 2016-12-07 DIAGNOSIS — N3010 Interstitial cystitis (chronic) without hematuria: Secondary | ICD-10-CM | POA: Diagnosis not present

## 2016-12-11 DIAGNOSIS — F411 Generalized anxiety disorder: Secondary | ICD-10-CM | POA: Diagnosis not present

## 2016-12-12 ENCOUNTER — Ambulatory Visit (HOSPITAL_COMMUNITY): Payer: Medicare Other

## 2016-12-12 DIAGNOSIS — N3010 Interstitial cystitis (chronic) without hematuria: Secondary | ICD-10-CM | POA: Diagnosis not present

## 2016-12-14 ENCOUNTER — Encounter (HOSPITAL_COMMUNITY): Payer: Medicare Other

## 2016-12-25 DIAGNOSIS — N3000 Acute cystitis without hematuria: Secondary | ICD-10-CM | POA: Diagnosis not present

## 2016-12-25 DIAGNOSIS — F411 Generalized anxiety disorder: Secondary | ICD-10-CM | POA: Diagnosis not present

## 2016-12-26 DIAGNOSIS — N3010 Interstitial cystitis (chronic) without hematuria: Secondary | ICD-10-CM | POA: Diagnosis not present

## 2017-01-01 ENCOUNTER — Encounter (HOSPITAL_COMMUNITY): Payer: Medicare Other

## 2017-01-03 DIAGNOSIS — Z23 Encounter for immunization: Secondary | ICD-10-CM | POA: Diagnosis not present

## 2017-01-03 DIAGNOSIS — I10 Essential (primary) hypertension: Secondary | ICD-10-CM | POA: Diagnosis not present

## 2017-01-04 DIAGNOSIS — N3000 Acute cystitis without hematuria: Secondary | ICD-10-CM | POA: Diagnosis not present

## 2017-01-10 ENCOUNTER — Encounter (HOSPITAL_COMMUNITY): Admission: RE | Admit: 2017-01-10 | Payer: Medicare Other | Source: Ambulatory Visit

## 2017-01-15 DIAGNOSIS — N3010 Interstitial cystitis (chronic) without hematuria: Secondary | ICD-10-CM | POA: Diagnosis not present

## 2017-01-16 ENCOUNTER — Ambulatory Visit (HOSPITAL_COMMUNITY): Payer: Medicare Other

## 2017-01-16 DIAGNOSIS — N3000 Acute cystitis without hematuria: Secondary | ICD-10-CM | POA: Diagnosis not present

## 2017-01-16 DIAGNOSIS — I10 Essential (primary) hypertension: Secondary | ICD-10-CM | POA: Diagnosis not present

## 2017-01-16 DIAGNOSIS — E559 Vitamin D deficiency, unspecified: Secondary | ICD-10-CM | POA: Diagnosis not present

## 2017-01-16 DIAGNOSIS — Z6836 Body mass index (BMI) 36.0-36.9, adult: Secondary | ICD-10-CM | POA: Diagnosis not present

## 2017-01-22 ENCOUNTER — Ambulatory Visit (HOSPITAL_COMMUNITY): Admission: RE | Admit: 2017-01-22 | Payer: Medicare Other | Source: Ambulatory Visit

## 2017-01-22 DIAGNOSIS — N3010 Interstitial cystitis (chronic) without hematuria: Secondary | ICD-10-CM | POA: Diagnosis not present

## 2017-01-23 DIAGNOSIS — N3000 Acute cystitis without hematuria: Secondary | ICD-10-CM | POA: Diagnosis not present

## 2017-01-23 DIAGNOSIS — M47816 Spondylosis without myelopathy or radiculopathy, lumbar region: Secondary | ICD-10-CM | POA: Insufficient documentation

## 2017-01-23 DIAGNOSIS — M25561 Pain in right knee: Secondary | ICD-10-CM | POA: Insufficient documentation

## 2017-01-23 DIAGNOSIS — M25562 Pain in left knee: Secondary | ICD-10-CM | POA: Insufficient documentation

## 2017-01-30 DIAGNOSIS — N390 Urinary tract infection, site not specified: Secondary | ICD-10-CM | POA: Diagnosis not present

## 2017-01-31 ENCOUNTER — Ambulatory Visit (HOSPITAL_COMMUNITY)
Admission: RE | Admit: 2017-01-31 | Discharge: 2017-01-31 | Disposition: A | Discharge status: Home / Self Care | Payer: Medicare Other | Source: Ambulatory Visit | Attending: Physician Assistant | Admitting: Physician Assistant

## 2017-01-31 DIAGNOSIS — R140 Abdominal distension (gaseous): Secondary | ICD-10-CM | POA: Diagnosis not present

## 2017-01-31 DIAGNOSIS — R110 Nausea: Secondary | ICD-10-CM | POA: Insufficient documentation

## 2017-01-31 DIAGNOSIS — Z6836 Body mass index (BMI) 36.0-36.9, adult: Secondary | ICD-10-CM | POA: Diagnosis not present

## 2017-01-31 DIAGNOSIS — E669 Obesity, unspecified: Secondary | ICD-10-CM | POA: Diagnosis not present

## 2017-01-31 DIAGNOSIS — N3010 Interstitial cystitis (chronic) without hematuria: Secondary | ICD-10-CM | POA: Diagnosis not present

## 2017-01-31 DIAGNOSIS — I10 Essential (primary) hypertension: Secondary | ICD-10-CM | POA: Diagnosis not present

## 2017-01-31 MED ORDER — TECHNETIUM TC 99M SULFUR COLLOID
2.2000 | Freq: Once | INTRAVENOUS | Status: AC | PRN
  Administered 2017-01-31: 2.2 via INTRAVENOUS

## 2017-02-01 DIAGNOSIS — N3010 Interstitial cystitis (chronic) without hematuria: Secondary | ICD-10-CM | POA: Diagnosis not present

## 2017-02-01 DIAGNOSIS — B373 Candidiasis of vulva and vagina: Secondary | ICD-10-CM | POA: Diagnosis not present

## 2017-02-01 DIAGNOSIS — N3020 Other chronic cystitis without hematuria: Secondary | ICD-10-CM | POA: Diagnosis not present

## 2017-02-01 DIAGNOSIS — R109 Unspecified abdominal pain: Secondary | ICD-10-CM | POA: Diagnosis not present

## 2017-02-06 DIAGNOSIS — N3010 Interstitial cystitis (chronic) without hematuria: Secondary | ICD-10-CM | POA: Diagnosis not present

## 2017-02-06 DIAGNOSIS — N3000 Acute cystitis without hematuria: Secondary | ICD-10-CM | POA: Diagnosis not present

## 2017-02-07 DIAGNOSIS — N3010 Interstitial cystitis (chronic) without hematuria: Secondary | ICD-10-CM | POA: Diagnosis not present

## 2017-02-12 DIAGNOSIS — N3010 Interstitial cystitis (chronic) without hematuria: Secondary | ICD-10-CM | POA: Diagnosis not present

## 2017-02-13 DIAGNOSIS — N3011 Interstitial cystitis (chronic) with hematuria: Secondary | ICD-10-CM | POA: Diagnosis not present

## 2017-02-13 DIAGNOSIS — I10 Essential (primary) hypertension: Secondary | ICD-10-CM | POA: Diagnosis not present

## 2017-02-15 ENCOUNTER — Other Ambulatory Visit: Payer: Self-pay

## 2017-02-15 ENCOUNTER — Encounter (HOSPITAL_BASED_OUTPATIENT_CLINIC_OR_DEPARTMENT_OTHER): Payer: Self-pay | Admitting: *Deleted

## 2017-02-15 ENCOUNTER — Emergency Department (HOSPITAL_BASED_OUTPATIENT_CLINIC_OR_DEPARTMENT_OTHER)
Admission: EM | Admit: 2017-02-15 | Discharge: 2017-02-15 | Disposition: A | Discharge status: Home / Self Care | Payer: Medicare Other | Attending: Emergency Medicine | Admitting: Emergency Medicine

## 2017-02-15 DIAGNOSIS — Z79899 Other long term (current) drug therapy: Secondary | ICD-10-CM | POA: Diagnosis not present

## 2017-02-15 DIAGNOSIS — R110 Nausea: Secondary | ICD-10-CM | POA: Insufficient documentation

## 2017-02-15 DIAGNOSIS — I10 Essential (primary) hypertension: Secondary | ICD-10-CM | POA: Diagnosis not present

## 2017-02-15 DIAGNOSIS — R197 Diarrhea, unspecified: Secondary | ICD-10-CM | POA: Diagnosis present

## 2017-02-15 DIAGNOSIS — R6883 Chills (without fever): Secondary | ICD-10-CM | POA: Diagnosis not present

## 2017-02-15 DIAGNOSIS — N390 Urinary tract infection, site not specified: Secondary | ICD-10-CM | POA: Insufficient documentation

## 2017-02-15 DIAGNOSIS — R300 Dysuria: Secondary | ICD-10-CM | POA: Diagnosis not present

## 2017-02-15 LAB — URINALYSIS, MICROSCOPIC (REFLEX)

## 2017-02-15 LAB — CBC WITH DIFFERENTIAL/PLATELET
Basophils Absolute: 0 10*3/uL (ref 0.0–0.1)
Basophils Relative: 0 %
Eosinophils Absolute: 0.1 10*3/uL (ref 0.0–0.7)
Eosinophils Relative: 1 %
HCT: 40 % (ref 36.0–46.0)
Hemoglobin: 13.1 g/dL (ref 12.0–15.0)
Lymphocytes Relative: 24 %
Lymphs Abs: 2.2 10*3/uL (ref 0.7–4.0)
MCH: 27.7 pg (ref 26.0–34.0)
MCHC: 32.8 g/dL (ref 30.0–36.0)
MCV: 84.6 fL (ref 78.0–100.0)
Monocytes Absolute: 0.8 10*3/uL (ref 0.1–1.0)
Monocytes Relative: 8 %
Neutro Abs: 6.2 10*3/uL (ref 1.7–7.7)
Neutrophils Relative %: 67 %
Platelets: 290 10*3/uL (ref 150–400)
RBC: 4.73 MIL/uL (ref 3.87–5.11)
RDW: 12.7 % (ref 11.5–15.5)
WBC: 9.3 10*3/uL (ref 4.0–10.5)

## 2017-02-15 LAB — URINALYSIS, ROUTINE W REFLEX MICROSCOPIC
Glucose, UA: NEGATIVE mg/dL
Hgb urine dipstick: NEGATIVE
Ketones, ur: 15 mg/dL — AB
Nitrite: NEGATIVE
Protein, ur: 30 mg/dL — AB
Specific Gravity, Urine: 1.025 (ref 1.005–1.030)
pH: 5 (ref 5.0–8.0)

## 2017-02-15 LAB — COMPREHENSIVE METABOLIC PANEL
ALT: 18 U/L (ref 14–54)
AST: 25 U/L (ref 15–41)
Albumin: 4.5 g/dL (ref 3.5–5.0)
Alkaline Phosphatase: 86 U/L (ref 38–126)
Anion gap: 9 (ref 5–15)
BUN: 18 mg/dL (ref 6–20)
CO2: 23 mmol/L (ref 22–32)
Calcium: 9.3 mg/dL (ref 8.9–10.3)
Chloride: 105 mmol/L (ref 101–111)
Creatinine, Ser: 1.12 mg/dL — ABNORMAL HIGH (ref 0.44–1.00)
GFR calc Af Amer: 57 mL/min — ABNORMAL LOW (ref >60)
GFR calc non Af Amer: 49 mL/min — ABNORMAL LOW (ref >60)
Glucose, Bld: 105 mg/dL — ABNORMAL HIGH (ref 65–99)
Potassium: 4.2 mmol/L (ref 3.5–5.1)
Sodium: 137 mmol/L (ref 135–145)
Total Bilirubin: 0.7 mg/dL (ref 0.3–1.2)
Total Protein: 8.1 g/dL (ref 6.5–8.1)

## 2017-02-15 LAB — LIPASE, BLOOD: Lipase: 18 U/L (ref 11–51)

## 2017-02-15 LAB — I-STAT CG4 LACTIC ACID, ED: Lactic Acid, Venous: 1.69 mmol/L (ref 0.5–1.9)

## 2017-02-15 MED ORDER — FOSFOMYCIN TROMETHAMINE 3 G PO PACK
3.0000 g | PACK | Freq: Once | ORAL | Status: AC
  Administered 2017-02-15: 3 g via ORAL
  Filled 2017-02-15: qty 3

## 2017-02-15 MED ORDER — FOSFOMYCIN TROMETHAMINE 3 G PO PACK: 3.0000 g | PACK | Freq: Once | ORAL | 0 refills | Status: DC

## 2017-02-15 MED ORDER — SODIUM CHLORIDE 0.9 % IV BOLUS (SEPSIS)
1000.0000 mL | Freq: Once | INTRAVENOUS | Status: AC
  Administered 2017-02-15: 1000 mL via INTRAVENOUS

## 2017-02-15 NOTE — ED Triage Notes (Signed)
UTI. States her urologist will not treat her with antibiotics because she has multiple med allergies.

## 2017-02-15 NOTE — ED Provider Notes (Signed)
[facility] EMERGENCY DEPARTMENT Provider Note   CSN: [id number] Arrival date & time: 02/15/17  1414     History   Chief Complaint Chief Complaint  Patient presents with  . Urinary Tract Infection    HPI Jennifer ReamerBarbara E [name] is a 71 y.o. female hx of fibromyalgia, chronic fatigue syndrome, recurrent urinary tract infections here presenting with diarrhea, dysuria, nausea, chills.  Patient states that about 10 days ago she had some dysuria and went to see her doctor and the urine culture showed E. Coli that is very resistant.  Patient had several doses of gentamicin into her bladder with minimal relief.  For the last several days she had recurrent dysuria as well as subjective chills and low-grade temp 99.  She also felt nauseated but had no vomiting.  She had several episodes of diarrhea today.  She went to her urologist office and was sent here for further evaluations due to her multiple allergies.   The history is provided by the patient.    Past Medical History:  Diagnosis Date  . Acute meniscal tear of left knee    FOLLOWED BY DR GIAFFREY  . BPPV (benign paroxysmal positional vertigo)   . Chronic bladder pain   . Chronic fatigue syndrome   . Chronic low back pain    W/ RIGHT LEG PAIN AND NUMBNESS  . Cystitis, chronic   . Fibromyalgia   . GERD (gastroesophageal reflux disease)   . Hiatal hernia   . Hypertension   . IC (interstitial cystitis)   . OSA (obstructive sleep apnea)    per pt study yrs ago-- moderate osa ,  intolerant cpap  . Pinched vertebral nerve    bilateral L2 -- L3 and L3 -- L4-/  epidural injection's treatment , PT and pain clince  . PONV (postoperative nausea and vomiting)    severe  . 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 Self-catheterizes urinary bladder   . Spinal stenosis, lumbar region with neurogenic claudication   . Wears glasses   . Wears partial dentures    upper and lower    Patient Active  Problem List   Diagnosis Date Noted  . IBS (irritable bowel syndrome) 04/19/2015  . GERD (gastroesophageal reflux disease) 03/21/2015  . Fibromyalgia 03/21/2015  . Recurrent bacterial cystitis 03/21/2015  . HTN (hypertension) 03/21/2015    Past Surgical History:  Procedure Laterality Date  . ABDOMINAL HYSTERECTOMY  1980   w/ Left Salpingoophorectomy  . CARDIAC CATHETERIZATION  08-21-2001  dr Verdis Primehenry smith   normal coronary arteries and LVF  . CARPAL TUNNEL RELEASE Bilateral 1986  . C[facility]ALCYSTOPLAST[facility]  [facility]  [facility]  . CHOLECYSTECTOMY  1992  . CYSTOSCOPY/HYDRODISTENSION N/A 01/12/2016   [facility]formed [facility]enbaum[facility] Sigmund[facility] [place]  . CYSTOSCOPY/HYDRODISTENSION OF BLADDER INJECTION OF MARCAINE/PYRIDIUM N/A 08/30/2016   Performed by Jethro Bolusannenbaum, Sigmund, [facility] [place]  . MULTIPLE CYSTO/  HYDRODISTENTION/  INSTILLATION THERAPY  last one 08-19-2009  . NASAL SINUS SURGERY  1987  . TONSILLECTOMY  as child  . VAGINAL GROWTH REMOVED  as teen    OB History    No data available       Home Medications    Prior to Admission medications   Medication Sig Start Date End Date Taking? Authorizing Provider  Alum Hydroxide-Mag Carbonate (GAVISCON PO) Take by mouth daily. 1 to 2 TIMES DAILY  [provider]  augmented betamethasone dipropionate (DIPROLENE-AF) 0.05 % cream Apply topically daily as needed.    [provider]  Cholecalciferol (VITAMIN D3) 50000 units CAPS Take 1 capsule by mouth once a week.    [provider]  dextromethorphan-guaiFENesin (MUCINEX DM) 30-600 MG 12hr tablet Take 1 tablet by mouth 2 (two) times daily as needed for cough.    [provider]  fexofenadine (ALLEGRA) 60 MG tablet Take 60 mg by mouth daily. Reported on 05/03/2015 01/06/14   [provider]  HYDROcodone-acetaminophen (NORCO) 7.5-325 MG tablet Take 1 tablet by mouth every 8 (eight) hours as needed.   02/07/15   [provider]  meclizine (ANTIVERT) 25 MG tablet Take 25 mg by mouth 3 (three) times daily as needed.     [provider]  methenamine (HIPREX) 1 G tablet Take by mouth as directed. TAKES WHEN NOT TAKING ANTIBIOTICS    [provider]  nebivolol (BYSTOLIC) 10 MG tablet Take 10 mg by mouth every morning.  01/08/14   [provider]  nystatin cream (MYCOSTATIN) APPLY TO AFFECTED AREA TWICE A DAY---  PRN 02/04/15   [provider]  omeprazole (PRILOSEC) 20 MG capsule 1 capsule ONE TO TWO TIMES DAILY 02/08/14   [provider]  pentosan polysulfate (ELMIRON) 100 MG capsule 100 mg. WEEKLY PRN BLADDER INSTILLATION AT DR [facility] OFFICE    [provider]  promethazine (PHENERGAN) 25 MG tablet 1 tablet every six (6) hours as needed. 02/08/14   [provider]  sucralfate (CARAFATE) 1 G tablet Take 1 g by mouth daily.     [provider]  triamcinolone cream (KENALOG) 0.1 % BID PRN FOR ECZEMA 02/03/15   [provider]    Family History Family History  Problem Relation Age of Onset  . CAD Father   . Asthma Father   . Hypertension Father   . Heart failure Mother   . Peripheral vascular disease Mother   . Stroke Mother   . Hypertension Mother   . Cancer Brother        melanoma    Social History Social History   Tobacco Use  . Smoking status: Never Smoker  . Smokeless tobacco: Never Used  Substance Use Topics  . Alcohol use: No    Alcohol/week: 0.0 oz  . Drug use: No     Allergies   Avelox [moxifloxacin]; Quinolones; Sulfa antibiotics; Asa [aspirin]; Dmso [dimethyl sulfoxide]; Ibuprofen; Macrodantin [nitrofurantoin macrocrystal]; Nitrofurantoin; Adhesive [tape]; Codeine; and Yellow dye   Review of Systems Review of Systems  Gastrointestinal: Positive for nausea.  All other systems reviewed and are negative.    Physical Exam Updated Vital Signs BP (!) 153/92   Pulse (!) 59    Temp 98.3 F (36.8 C) (Oral)   Resp 20   Ht 5\' 4"  (1.626 m)   Wt 93 kg (205 lb)   SpO2 99%   BMI 35.19 kg/m   Physical Exam  Constitutional: She is oriented to person, place, and time. She appears well-developed.  HENT:  Head: Normocephalic.  Mouth/Throat: Oropharynx is clear and moist.  Eyes: Conjunctivae and EOM are normal. Pupils are equal, round, and reactive to light.  Neck: Normal range of motion. Neck supple.  Cardiovascular: Normal rate, regular rhythm and normal heart sounds.  Pulmonary/Chest: Effort normal and breath sounds normal. No stridor. No respiratory distress. She has no wheezes.  Abdominal: Soft. Bowel sounds are normal. She exhibits no distension. There is no tenderness. There  is no guarding.  No CVAT   Musculoskeletal: Normal range of motion.  Neurological: She is alert and oriented to person, place, and time. She displays normal reflexes. No cranial nerve deficit. Coordination normal.  Skin: Skin is warm.  Psychiatric: She has a normal mood and affect.  Nursing note and vitals reviewed.    ED Treatments / Results  Labs (all labs ordered are listed, but only abnormal results are displayed) Labs Reviewed  URINALYSIS, ROUTINE W REFLEX MICROSCOPIC - Abnormal; Notable for the following components:      Result Value   APPearance CLOUDY (*)    Bilirubin Urine SMALL (*)    Ketones, ur 15 (*)    Protein, ur 30 (*)    Leukocytes, UA SMALL (*)    All other components within normal limits  COMPREHENSIVE METABOLIC PANEL - Abnormal; Notable for the following components:   Glucose, Bld 105 (*)    Creatinine, Ser 1.12 (*)    GFR calc non Af Amer 49 (*)    GFR calc Af Amer 57 (*)    All other components within normal limits  URINALYSIS, MICROSCOPIC (REFLEX) - Abnormal; Notable for the following components:   Bacteria, UA MANY (*)    Squamous Epithelial / LPF 6-30 (*)    All other components within normal limits  URINE CULTURE  CBC WITH DIFFERENTIAL/PLATELET    LIPASE, BLOOD  I-STAT CG4 LACTIC ACID, ED    EKG  EKG Interpretation None       Radiology No results found.  Procedures Procedures (including critical care time)  Medications Ordered in ED Medications  fosfomycin (MONUROL) packet 3 g (3 g Oral Given 02/15/17 1521)  sodium chloride 0.9 % bolus 1,000 mL (1,000 mLs Intravenous New Bag/Given 02/15/17 1528)     Initial Impression / Assessment and Plan / ED Course  I have reviewed the triage vital signs and the nursing notes.  Pertinent labs & imaging results that were available during my care of the patient were reviewed by me and considered in my medical decision making (see chart for details).     Jennifer ReamerBarbara E Dowson is a 71 y.o. female here with dysuria, chills. Afebrile, vitals stable. I reviewed previous urine culture in 1/17 as well as urine culture from the office. It is E coli that is multi drug resistant and she has multiple allergies. Will get CBC and if normal, can try fosfomycin as patient hasn't try it before. If CBC abnormal, then will need imipenem and IV abx.    4:19 PM WBC nl, lactate nl. Mild AKI but given IVF. UA + bacteria but neg leuk and nitrate. Not sure if she is colonized or not. Urine culture sent. Will give extra dose of fosfomycin to be taken in 3 days if she still has symptoms. No signs of pyelo, doesn't appear septic    Final Clinical Impressions(s) / ED Diagnoses   Final diagnoses:  None    ED Discharge Orders    None       Charlynne PanderYao, David Hsienta, MD 02/15/17 [phone]

## 2017-02-15 NOTE — Discharge Instructions (Signed)
Take fosfomycin on Monday (3 days from now) if you still have dysuria or bladder pain.   See your urologist and primary care doctor  Return to ER if you have fever, vomiting, flank pain

## 2017-02-17 LAB — URINE CULTURE: Culture: >=100000 — AB

## 2017-02-18 ENCOUNTER — Telehealth: Payer: Self-pay | Admitting: Emergency Medicine

## 2017-02-18 MED FILL — MONUROL 3 GM SACHET: 3 | 1 days supply | Qty: 1 | Fill #0

## 2017-02-18 NOTE — Progress Notes (Signed)
ED Antimicrobial Stewardship Positive Culture Follow Up   [name] is an 71 y.o. female who presented to [facility] on 02/15/2017 with a chief complaint of  Chief Complaint  Patient presents with  . Urinary Tract Infection    Recent Results (from the past 720 hour(s))  Urine culture     Status: Abnormal   Collection Time: 02/15/17  3:02 PM  Result Value Ref Range Status   Specimen Description URINE, CLEAN CATCH  Final   Special Requests NONE  Final   Culture (A)  Final    >=100,000 COLONIES/mL ESCHERICHIA COLI Confirmed Extended Spectrum Beta-Lactamase Producer (ESBL).  In bloodstream infections from ESBL organisms, carbapenems are preferred over piperacillin/tazobactam. They are shown to have a lower risk of mortality. Performed at [facility], [address] [place], [place] [address]    Report Status 02/17/2017 FINAL  Final   Organism ID, Bacteria ESCHERICHIA COLI (A)  Final      Susceptibility   Escherichia coli - MIC*    AMPICILLIN >=32 RESISTANT Resistant     CEFAZOLIN >=64 RESISTANT Resistant     CEFTRIAXONE >=64 RESISTANT Resistant     CIPROFLOXACIN >=4 RESISTANT Resistant     GENTAMICIN <=1 SENSITIVE Sensitive     IMIPENEM <=0.25 SENSITIVE Sensitive     NITROFURANTOIN <=16 SENSITIVE Sensitive     TRIMETH/SULFA >=320 RESISTANT Resistant     AMPICILLIN/SULBACTAM 4 SENSITIVE Sensitive     PIP/TAZO <=4 SENSITIVE Sensitive     Extended ESBL POSITIVE Resistant     * >=100,000 COLONIES/mL ESCHERICHIA COLI    [x]  Treated with fosfomycin, organism susceptibility to prescribed antimicrobial is unknown (not routinely tested)  Patient with history recurrent UTIs now with positive ESBL urine culture, previously treated with gentamicin with minimal improvement noted. Oral antibiotic options have been exhausted.    Plan: Call patient for symptom check; if continued symptoms (dysuria, nausea, chills), will need to admit patient for IV antibiotics. If no symptoms,  require repeat UA/PCP visit.   ED Provider: Lovey NewcomerElizabeth Hammond   Jennifer [name], PharmD PGY1 Pharmacy Resident ID pharmacist phone # [phone] 02/18/17 9:51 AM

## 2017-02-18 NOTE — Telephone Encounter (Signed)
Post ED Visit - Positive Culture Follow-up: Successful Patient Follow-Up  Culture assessed and recommendations reviewed by: []  Enzo BiNathan Batchelder, Pharm.D. []  Celedonio MiyamotoJeremy Frens, Pharm.D., BCPS AQ-ID []  Garvin FilaMike Maccia, Pharm.D., BCPS []  Georgina PillionElizabeth Martin, Pharm.D., BCPS []  BuffaloMinh Pham, [address]Pharm.D., BCPS, AAHIVP []  Estella HuskMichelle Turner, Pharm.D., BCPS, AAHIVP []  Lysle Pearlachel Rumbarger, PharmD, BCPS []  Casilda Carlsaylor Stone, PharmD, BCPS []  Pollyann SamplesAndy Johnston, PharmD, BCPS  Positive urine culture  []  Patient discharged without antimicrobial prescription and treatment is now indicated [x]  Organism is resistant to prescribed ED discharge antimicrobial []  Patient with positive blood cultures  Changes discussed with ED provider: Lyndel SafeElizabeth Hammond PA New antibiotic prescription symptom check, if + will need to return for admit with IV antibiotics, is no sx, repeat u/a /PCP visit   lvm for callback   Berle MullMiller, Jennifer [name] 02/18/2017, 1:49 PM

## 2017-02-19 ENCOUNTER — Telehealth: Payer: Self-pay | Admitting: Emergency Medicine

## 2017-02-19 DIAGNOSIS — R110 Nausea: Secondary | ICD-10-CM | POA: Diagnosis not present

## 2017-02-19 DIAGNOSIS — Z1211 Encounter for screening for malignant neoplasm of colon: Secondary | ICD-10-CM | POA: Diagnosis not present

## 2017-02-19 DIAGNOSIS — R198 Other specified symptoms and signs involving the digestive system and abdomen: Secondary | ICD-10-CM | POA: Diagnosis not present

## 2017-02-20 DIAGNOSIS — N3020 Other chronic cystitis without hematuria: Secondary | ICD-10-CM | POA: Diagnosis not present

## 2017-02-20 DIAGNOSIS — R109 Unspecified abdominal pain: Secondary | ICD-10-CM | POA: Diagnosis not present

## 2017-02-20 DIAGNOSIS — N952 Postmenopausal atrophic vaginitis: Secondary | ICD-10-CM | POA: Diagnosis not present

## 2017-02-26 DIAGNOSIS — F411 Generalized anxiety disorder: Secondary | ICD-10-CM | POA: Diagnosis not present

## 2017-03-18 DIAGNOSIS — J320 Chronic maxillary sinusitis: Secondary | ICD-10-CM | POA: Diagnosis not present

## 2017-03-18 DIAGNOSIS — J301 Allergic rhinitis due to pollen: Secondary | ICD-10-CM | POA: Diagnosis not present

## 2017-03-18 DIAGNOSIS — J040 Acute laryngitis: Secondary | ICD-10-CM | POA: Diagnosis not present

## 2017-03-18 DIAGNOSIS — J322 Chronic ethmoidal sinusitis: Secondary | ICD-10-CM | POA: Diagnosis not present

## 2017-03-18 DIAGNOSIS — H8143 Vertigo of central origin, bilateral: Secondary | ICD-10-CM | POA: Diagnosis not present

## 2017-03-18 DIAGNOSIS — B370 Candidal stomatitis: Secondary | ICD-10-CM | POA: Diagnosis not present

## 2017-03-18 DIAGNOSIS — H9313 Tinnitus, bilateral: Secondary | ICD-10-CM | POA: Diagnosis not present

## 2017-03-19 DIAGNOSIS — F411 Generalized anxiety disorder: Secondary | ICD-10-CM | POA: Diagnosis not present

## 2017-03-20 ENCOUNTER — Ambulatory Visit: Payer: Medicare Other | Admitting: Infectious Diseases

## 2017-03-29 DIAGNOSIS — R51 Headache: Secondary | ICD-10-CM | POA: Diagnosis not present

## 2017-03-29 DIAGNOSIS — I10 Essential (primary) hypertension: Secondary | ICD-10-CM | POA: Diagnosis not present

## 2017-03-29 DIAGNOSIS — H8110 Benign paroxysmal vertigo, unspecified ear: Secondary | ICD-10-CM | POA: Diagnosis not present

## 2017-03-29 DIAGNOSIS — N390 Urinary tract infection, site not specified: Secondary | ICD-10-CM | POA: Diagnosis not present

## 2017-04-03 ENCOUNTER — Other Ambulatory Visit: Payer: Self-pay | Admitting: Family Medicine

## 2017-04-03 DIAGNOSIS — R51 Headache: Principal | ICD-10-CM

## 2017-04-03 DIAGNOSIS — R519 Headache, unspecified: Secondary | ICD-10-CM

## 2017-04-09 ENCOUNTER — Ambulatory Visit: Payer: Medicare Other | Admitting: Infectious Diseases

## 2017-04-12 ENCOUNTER — Other Ambulatory Visit: Payer: Medicare Other

## 2017-04-16 DIAGNOSIS — H8110 Benign paroxysmal vertigo, unspecified ear: Secondary | ICD-10-CM | POA: Diagnosis not present

## 2017-04-16 DIAGNOSIS — H8143 Vertigo of central origin, bilateral: Secondary | ICD-10-CM | POA: Diagnosis not present

## 2017-04-17 ENCOUNTER — Ambulatory Visit: Payer: Medicare Other | Admitting: Infectious Diseases

## 2017-04-19 ENCOUNTER — Other Ambulatory Visit: Payer: Medicare Other

## 2017-04-23 ENCOUNTER — Ambulatory Visit: Payer: Medicare Other | Admitting: Infectious Diseases

## 2017-04-29 ENCOUNTER — Other Ambulatory Visit: Payer: Medicare Other

## 2017-04-29 DIAGNOSIS — J322 Chronic ethmoidal sinusitis: Secondary | ICD-10-CM | POA: Diagnosis not present

## 2017-04-29 DIAGNOSIS — M25561 Pain in right knee: Secondary | ICD-10-CM | POA: Diagnosis not present

## 2017-04-29 DIAGNOSIS — M48062 Spinal stenosis, lumbar region with neurogenic claudication: Secondary | ICD-10-CM | POA: Diagnosis not present

## 2017-04-29 DIAGNOSIS — R490 Dysphonia: Secondary | ICD-10-CM | POA: Diagnosis not present

## 2017-04-29 DIAGNOSIS — M25562 Pain in left knee: Secondary | ICD-10-CM | POA: Diagnosis not present

## 2017-04-29 DIAGNOSIS — J320 Chronic maxillary sinusitis: Secondary | ICD-10-CM | POA: Diagnosis not present

## 2017-04-30 DIAGNOSIS — M6281 Muscle weakness (generalized): Secondary | ICD-10-CM | POA: Diagnosis not present

## 2017-04-30 DIAGNOSIS — R102 Pelvic and perineal pain: Secondary | ICD-10-CM | POA: Diagnosis not present

## 2017-04-30 DIAGNOSIS — M62838 Other muscle spasm: Secondary | ICD-10-CM | POA: Diagnosis not present

## 2017-04-30 DIAGNOSIS — N393 Stress incontinence (female) (male): Secondary | ICD-10-CM | POA: Diagnosis not present

## 2017-04-30 DIAGNOSIS — N3010 Interstitial cystitis (chronic) without hematuria: Secondary | ICD-10-CM | POA: Diagnosis not present

## 2017-05-01 ENCOUNTER — Ambulatory Visit (INDEPENDENT_AMBULATORY_CARE_PROVIDER_SITE_OTHER): Payer: Medicare Other | Admitting: Infectious Diseases

## 2017-05-01 ENCOUNTER — Encounter: Payer: Self-pay | Admitting: Infectious Diseases

## 2017-05-01 DIAGNOSIS — N3090 Cystitis, unspecified without hematuria: Secondary | ICD-10-CM

## 2017-05-01 DIAGNOSIS — I10 Essential (primary) hypertension: Secondary | ICD-10-CM | POA: Diagnosis not present

## 2017-05-01 MED ORDER — AMOXICILLIN-POT CLAVULANATE 875-125 MG PO TABS: 1.0000 | ORAL_TABLET | Freq: Two times a day (BID) | ORAL | 2 refills | Status: DC

## 2017-05-01 NOTE — Progress Notes (Signed)
Subjective:    Patient ID: Jennifer ReamerBarbara E [name], female    DOB: Oct 10, 1945, 72 y.o.   MRN: [id number]  HPI 72 yo F with hx HTN, fibromyalgia, gerd, possible IBS, and [facility]. She had 12 months of bladder infection afterwards. 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 She self cath's and has had difficulty keeping with boiling her cath's.  Has had multiple UTI with E coli as well as P mirabilis. She was found on CT 2016 to have small kidney stone.   Previously has been on macrobid, kelfex (abd rash/fungus), IV gent (01-2015), augmentin. As well as cranberry extract.  UCx (03-14-15) E coli- ESBL, (S- augmentin, zosyn, imipenem, gent/tobra, nitro).  She was seen in ID 03-2015 and was given prn rx for augmentin as well as education on UTI and colonization.  She was last seen in ID 11-2015. She was seen in Uro Oct/Nov for worsening pain,  She had UCx 11-7 E coli (> 100k) S- unasyn/augmentin, amikacin/gent, Ceftaz/Cefepime/cefoxitin, imi/mero/ertapenem, bactrim, zosyn, nitro.  then ED on 01-2017 for UTI. UA (-) for LE and Nitr. + bacteria. She was given fosfomycin.  Results for orders placed or performed during the hospital encounter of 02/15/17  Urine culture     Status: Abnormal   Collection Time: 02/15/17  3:02 PM  Result Value Ref Range Status   Specimen Description URINE, CLEAN CATCH  Final   Special Requests NONE  Final   Culture (A)  Final    >=100,000 COLONIES/mL ESCHERICHIA COLI Confirmed Extended Spectrum Beta-Lactamase Producer (ESBL).  In bloodstream infections from ESBL organisms, carbapenems are preferred over piperacillin/tazobactam. They are shown to have a lower risk of mortality. Performed at [facility], [address] [place], [place] [address]    Report Status 02/17/2017 FINAL  Final   Organism ID, Bacteria ESCHERICHIA COLI (A)  Final      Susceptibility   Escherichia coli - MIC*   AMPICILLIN >=32 RESISTANT Resistant     CEFAZOLIN >=64 RESISTANT Resistant     CEFTRIAXONE >=64 RESISTANT Resistant     CIPROFLOXACIN >=4 RESISTANT Resistant     GENTAMICIN <=1 SENSITIVE Sensitive     IMIPENEM <=0.25 SENSITIVE Sensitive     NITROFURANTOIN <=16 SENSITIVE Sensitive     TRIMETH/SULFA >=320 RESISTANT Resistant     AMPICILLIN/SULBACTAM 4 SENSITIVE Sensitive     PIP/TAZO <=4 SENSITIVE Sensitive     Extended ESBL POSITIVE Resistant     * >=100,000 COLONIES/mL ESCHERICHIA COLI   Today she feels that she has worsened odor, no cloudiness, normal level of mucous, dysuria, no fever but has had chills. Temp was up to 99.6. Feels like this is worse after her uro pysical therapy. Was recently started on vaginal estrace.  She has recently been started on zithro for a sinus infection. Has CT sinuses pending.   She is struggling to find a new Uro after Dr Arta Silenceannenbaum retires.   Review of Systems  Constitutional: Positive for fatigue. Negative for chills and fever.  Cardiovascular: Negative for chest pain.  Gastrointestinal: Positive for diarrhea. Negative for constipation.  Endocrine: Positive for polyuria.  Genitourinary: Positive for difficulty urinating, dysuria and frequency.  Musculoskeletal: Positive for arthralgias.  Neurological: Positive for dizziness and headaches.  Please see HPI. All other systems reviewed and negative.      Objective:   Physical Exam  Constitutional: She appears well-developed and well-nourished.  HENT:  Mouth/Throat: No oropharyngeal exudate.  Eyes: EOM are normal. Pupils are equal, round, and reactive to light.  Neck: Neck supple.  Cardiovascular: Normal rate, regular rhythm and normal heart sounds.  Pulmonary/Chest: Effort normal and breath sounds normal.  Abdominal: Soft. Bowel sounds are normal. There is no tenderness. There is no rebound and no CVA tenderness.  Lymphadenopathy:    She has no cervical adenopathy.      Assessment & Plan:

## 2017-05-01 NOTE — Assessment & Plan Note (Addendum)
She identifies 4 sx that she feels predicts her infection episodes- suprapubic pain, frequency, odor, burning.  I asked her to call any time she has these and we will write her a rx. Augmentin.  Will treat her first, get Cx within 24h.  She is going to try to get an appt with Dr Logan BoresEvans after she gets her records from [facility].   We spent at least 45 minutes together discussing this.

## 2017-05-01 NOTE — Assessment & Plan Note (Signed)
She is asx This has been ongoing for last weeks.  She will f/u with her PCP

## 2017-05-07 ENCOUNTER — Other Ambulatory Visit: Payer: Medicare Other

## 2017-05-15 ENCOUNTER — Other Ambulatory Visit: Payer: Self-pay | Admitting: *Deleted

## 2017-05-15 DIAGNOSIS — N3090 Cystitis, unspecified without hematuria: Secondary | ICD-10-CM

## 2017-05-15 MED ORDER — AMOXICILLIN-POT CLAVULANATE 875-125 MG PO TABS: 1.0000 | ORAL_TABLET | Freq: Two times a day (BID) | ORAL | 2 refills | Status: DC

## 2017-05-15 NOTE — Progress Notes (Signed)
Patient called, asked for a lab appointment to bring in her urine. RN reviewed Dr Moshe CiproHatcher's instructions with patient, sent prescription to patient's pharmacy of choice.  RN placed lab orders. Andree CossHowell, Karista Aispuro M, RN

## 2017-05-16 ENCOUNTER — Other Ambulatory Visit: Payer: Medicare Other

## 2017-05-17 ENCOUNTER — Other Ambulatory Visit: Payer: Medicare Other

## 2017-05-17 DIAGNOSIS — N3090 Cystitis, unspecified without hematuria: Secondary | ICD-10-CM

## 2017-05-17 LAB — URINALYSIS, ROUTINE W REFLEX MICROSCOPIC
Bacteria, UA: NONE SEEN /HPF
Bilirubin Urine: NEGATIVE
Glucose, UA: NEGATIVE
Hgb urine dipstick: NEGATIVE
Hyaline Cast: NONE SEEN /LPF
Ketones, ur: NEGATIVE
Nitrite: NEGATIVE
Protein, ur: NEGATIVE
RBC / HPF: NONE SEEN /HPF (ref 0–2)
Specific Gravity, Urine: 1.008 (ref 1.001–1.03)
pH: 5.5 (ref 5.0–8.0)

## 2017-05-20 LAB — URINE CULTURE
MICRO NUMBER:: 90205349
SPECIMEN QUALITY:: ADEQUATE

## 2017-05-24 DIAGNOSIS — F411 Generalized anxiety disorder: Secondary | ICD-10-CM | POA: Diagnosis not present

## 2017-05-30 ENCOUNTER — Ambulatory Visit
Admission: RE | Admit: 2017-05-30 | Discharge: 2017-05-30 | Disposition: A | Discharge status: Home / Self Care | Payer: Medicare Other | Source: Ambulatory Visit | Attending: Family Medicine | Admitting: Family Medicine

## 2017-05-30 DIAGNOSIS — R51 Headache: Principal | ICD-10-CM

## 2017-05-30 DIAGNOSIS — R42 Dizziness and giddiness: Secondary | ICD-10-CM | POA: Diagnosis not present

## 2017-05-30 DIAGNOSIS — R519 Headache, unspecified: Secondary | ICD-10-CM

## 2017-06-05 ENCOUNTER — Telehealth: Payer: Self-pay

## 2017-06-05 NOTE — Telephone Encounter (Signed)
Patient is having supra pubic pain along with diarrhea, nausea, sweats and just not feeling right.   She is not sure if something is brewing .Marland Kitchen. Completed 13 days of 14 day regimen of Augmentin. I will check with the provider for appointment availability .   Please advise.    Laurell Josephsammy K Jeryl Wilbourn, RN

## 2017-06-07 ENCOUNTER — Ambulatory Visit (INDEPENDENT_AMBULATORY_CARE_PROVIDER_SITE_OTHER): Payer: Medicare Other | Admitting: Infectious Diseases

## 2017-06-07 ENCOUNTER — Encounter: Payer: Self-pay | Admitting: Infectious Diseases

## 2017-06-07 VITALS — BP 194/95 | HR 59 | Temp 98.4°F | Ht 64.0 in | Wt 201.0 lb

## 2017-06-07 DIAGNOSIS — I10 Essential (primary) hypertension: Secondary | ICD-10-CM

## 2017-06-07 DIAGNOSIS — N3090 Cystitis, unspecified without hematuria: Secondary | ICD-10-CM

## 2017-06-07 MED ORDER — BENAZEPRIL HCL 40 MG PO TABS: 40.0000 mg | ORAL_TABLET | Freq: Every day | ORAL | 3 refills | Status: DC

## 2017-06-07 MED ORDER — METH-HYO-M BL-BENZ ACD-PH SAL 81.6 MG PO TABS: 1.0000 | ORAL_TABLET | Freq: Two times a day (BID) | ORAL | 1 refills | Status: DC | PRN

## 2017-06-07 NOTE — Assessment & Plan Note (Signed)
Urine has slowed down, burning still, still having hesitancy.  drinking water- 3-4 glasses 12 oz/day. rec for her to drink 8- 12 oz glasses/day Will hold on more anbx at this point.  Has had pyridium before but didn't like staining.  Liked +hyophen, uragesic. Did not like urabell as much.  Will try hyophen.  Will see her back in 6 weeks.   I spent a total of 25 minutes with this patient. Greater than 50% of this time was spent counseling and coordinating care for this patient.

## 2017-06-07 NOTE — Progress Notes (Signed)
   Subjective:    Patient ID: Jennifer ReamerBarbara E [name], female    DOB: [birth year], 72 y.o.   MRN: [id number]  HPI 72yo F with hx HTN, fibromyalgia, gerd, possible IBS, and of [facility] at [facility]. She had 12 months of bladder infection afterwards. 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 She self cath's and has had difficulty keeping with boiling her cath's.  Has had multiple UTI with E coli as well as P mirabilis. She was found on CT 2016 to have small kidney stone.   Previously has been on macrobid, kelfex (abd rash/fungus), IV gent (01-2015), augmentin. As well as cranberry extract.  UCx (03-14-15) E coli- ESBL, (S- augmentin, zosyn, imipenem, gent/tobra, nitro).  She was seen in ID 03-2015 and was given prn rx for augmentin as well as education on UTI and colonization.  She was last seen in ID 11-2015. She was seen in Uro Oct/Nov for worsening pain,  She had UCx 11-7 E coli (> 100k) S- unasyn/augmentin, amikacin/gent, Ceftaz/Cefepime/cefoxitin, imi/mero/ertapenem, bactrim, zosyn, nitro.  then ED on 01-2017 for UTI. UA (-) for LE and Nitr. + bacteria. She was given fosfomycin.    Her cx grew E coli- ESBL.  s- nitro, augmentin, gent, imipenem.       She continues to get bladder PT at [facility]. Has f/u with Dr Logan BoresEvans on 07-04-17.   She had repeat UCx 05-17-17 which grew same E coli.  She had more pain, burning, hesitancy, then started on 2-22 augmentin. She had no problems with this- no n/v, no rash. occas nausea/mild. She also took a course of diflucan.  She had severe pain last week, was out of hydrocodone (for her knee/meniscus tear). Worse pain when she is sitting, better with standing.  Has had alternating loose BM and constipation, abd pain. Has call in to GI office.  Worried she has colon cancer. Mom had poly in her 3680s. Last colon 2006.   Review of Systems  Constitutional: Positive for chills and diaphoresis. Negative for  appetite change, fever and unexpected weight change.  Respiratory: Negative for shortness of breath.   Cardiovascular: Negative for chest pain.  Gastrointestinal: Positive for abdominal pain and constipation. Negative for blood in stool and diarrhea.       GERD, reflux.   Endocrine: Positive for polyuria.  Genitourinary: Positive for difficulty urinating and dysuria.  Musculoskeletal: Positive for arthralgias.  Neurological: Positive for headaches.  Please see HPI. All other systems reviewed and negative.      Objective:   Physical Exam  Constitutional: She appears well-developed and well-nourished.  HENT:  Mouth/Throat: No oropharyngeal exudate.  Eyes: EOM are normal. Pupils are equal, round, and reactive to light.  Neck: Neck supple.  Cardiovascular: Normal rate, regular rhythm and normal heart sounds.  Pulmonary/Chest: Effort normal and breath sounds normal.  Abdominal: Soft. Bowel sounds are normal. There is no tenderness. There is no rebound and no CVA tenderness.  Musculoskeletal: She exhibits no edema.  Lymphadenopathy:    She has no cervical adenopathy.       Assessment & Plan:

## 2017-06-07 NOTE — Addendum Note (Signed)
Addended by: HATCHER, JEFFREY C on: 06/07/2017 10:55 AM   Modules accepted: Orders

## 2017-06-07 NOTE — Assessment & Plan Note (Addendum)
She is asx Repeat 175/94.  Will ask her to f/u with PCP- she is going to call [name].  meds updated, she is also on lotensin 50mg  daily.

## 2017-06-13 DIAGNOSIS — J322 Chronic ethmoidal sinusitis: Secondary | ICD-10-CM | POA: Diagnosis not present

## 2017-06-13 DIAGNOSIS — J320 Chronic maxillary sinusitis: Secondary | ICD-10-CM | POA: Diagnosis not present

## 2017-06-14 DIAGNOSIS — Z1211 Encounter for screening for malignant neoplasm of colon: Secondary | ICD-10-CM | POA: Diagnosis not present

## 2017-06-14 DIAGNOSIS — R1030 Lower abdominal pain, unspecified: Secondary | ICD-10-CM | POA: Diagnosis not present

## 2017-06-14 DIAGNOSIS — R198 Other specified symptoms and signs involving the digestive system and abdomen: Secondary | ICD-10-CM | POA: Diagnosis not present

## 2017-06-14 DIAGNOSIS — K6289 Other specified diseases of anus and rectum: Secondary | ICD-10-CM | POA: Diagnosis not present

## 2017-06-14 DIAGNOSIS — R143 Flatulence: Secondary | ICD-10-CM | POA: Diagnosis not present

## 2017-06-18 ENCOUNTER — Telehealth: Payer: Self-pay | Admitting: *Deleted

## 2017-06-19 NOTE — Telephone Encounter (Signed)
Jennifer [name] called for clarification regarding hyophen/generic hyophen.  Per [facility], there is no generic alternative. Spoke with Ulyses SouthwardMinh Pham to see if there is a comparable generic treatment that she could afford/tolerate. Jennifer [name] took this information to [facility]. Unfortunately, the only generic alternative has been on back order since November 2018. She will do a partial fill of the hyophen, will ask [facility] to keep the prescription for her. She is scheduled to follow up with new urologist next month. Andree CossHowell, Juwana Thoreson M, RN

## 2017-06-26 DIAGNOSIS — R198 Other specified symptoms and signs involving the digestive system and abdomen: Secondary | ICD-10-CM | POA: Diagnosis not present

## 2017-06-26 DIAGNOSIS — R143 Flatulence: Secondary | ICD-10-CM | POA: Diagnosis not present

## 2017-06-26 DIAGNOSIS — K6289 Other specified diseases of anus and rectum: Secondary | ICD-10-CM | POA: Diagnosis not present

## 2017-06-26 DIAGNOSIS — R1030 Lower abdominal pain, unspecified: Secondary | ICD-10-CM | POA: Diagnosis not present

## 2017-07-04 DIAGNOSIS — N390 Urinary tract infection, site not specified: Secondary | ICD-10-CM | POA: Diagnosis not present

## 2017-07-04 DIAGNOSIS — N3010 Interstitial cystitis (chronic) without hematuria: Secondary | ICD-10-CM | POA: Diagnosis not present

## 2017-07-05 DIAGNOSIS — B373 Candidiasis of vulva and vagina: Secondary | ICD-10-CM | POA: Diagnosis not present

## 2017-07-05 DIAGNOSIS — R011 Cardiac murmur, unspecified: Secondary | ICD-10-CM | POA: Diagnosis not present

## 2017-07-05 DIAGNOSIS — N3011 Interstitial cystitis (chronic) with hematuria: Secondary | ICD-10-CM | POA: Diagnosis not present

## 2017-07-05 DIAGNOSIS — I10 Essential (primary) hypertension: Secondary | ICD-10-CM | POA: Diagnosis not present

## 2017-07-05 DIAGNOSIS — R51 Headache: Secondary | ICD-10-CM | POA: Diagnosis not present

## 2017-07-05 DIAGNOSIS — Z6836 Body mass index (BMI) 36.0-36.9, adult: Secondary | ICD-10-CM | POA: Diagnosis not present

## 2017-07-05 DIAGNOSIS — E669 Obesity, unspecified: Secondary | ICD-10-CM | POA: Diagnosis not present

## 2017-07-08 ENCOUNTER — Other Ambulatory Visit (HOSPITAL_COMMUNITY): Payer: Self-pay | Admitting: Family Medicine

## 2017-07-08 DIAGNOSIS — R011 Cardiac murmur, unspecified: Secondary | ICD-10-CM

## 2017-07-09 DIAGNOSIS — N3010 Interstitial cystitis (chronic) without hematuria: Secondary | ICD-10-CM | POA: Diagnosis not present

## 2017-07-11 ENCOUNTER — Ambulatory Visit (HOSPITAL_COMMUNITY)
Admission: RE | Admit: 2017-07-11 | Discharge: 2017-07-11 | Disposition: A | Discharge status: Home / Self Care | Payer: Medicare Other | Source: Ambulatory Visit | Attending: Family Medicine | Admitting: Family Medicine

## 2017-07-11 DIAGNOSIS — R011 Cardiac murmur, unspecified: Secondary | ICD-10-CM | POA: Insufficient documentation

## 2017-07-11 DIAGNOSIS — N3010 Interstitial cystitis (chronic) without hematuria: Secondary | ICD-10-CM | POA: Diagnosis not present

## 2017-07-11 NOTE — Progress Notes (Signed)
  Echocardiogram 2D Echocardiogram has been performed.  Jennifer [name] L Androw 07/11/2017, 12:17 PM

## 2017-07-15 DIAGNOSIS — N390 Urinary tract infection, site not specified: Secondary | ICD-10-CM | POA: Diagnosis not present

## 2017-07-15 DIAGNOSIS — H5500 Unspecified nystagmus: Secondary | ICD-10-CM | POA: Diagnosis not present

## 2017-07-15 DIAGNOSIS — H2513 Age-related nuclear cataract, bilateral: Secondary | ICD-10-CM | POA: Diagnosis not present

## 2017-07-15 DIAGNOSIS — B9620 Unspecified Escherichia coli [E. coli] as the cause of diseases classified elsewhere: Secondary | ICD-10-CM | POA: Diagnosis not present

## 2017-07-15 DIAGNOSIS — R8290 Unspecified abnormal findings in urine: Secondary | ICD-10-CM | POA: Diagnosis not present

## 2017-07-15 DIAGNOSIS — H52203 Unspecified astigmatism, bilateral: Secondary | ICD-10-CM | POA: Diagnosis not present

## 2017-07-16 MED FILL — MONUROL 3 GM SACHET: 3 | 1 days supply | Qty: 1 | Fill #0

## 2017-07-17 ENCOUNTER — Ambulatory Visit: Payer: Medicare Other | Admitting: Infectious Diseases

## 2017-08-02 DIAGNOSIS — F411 Generalized anxiety disorder: Secondary | ICD-10-CM | POA: Diagnosis not present

## 2017-08-05 DIAGNOSIS — M25561 Pain in right knee: Secondary | ICD-10-CM | POA: Diagnosis not present

## 2017-08-05 DIAGNOSIS — M25562 Pain in left knee: Secondary | ICD-10-CM | POA: Diagnosis not present

## 2017-08-05 DIAGNOSIS — M47816 Spondylosis without myelopathy or radiculopathy, lumbar region: Secondary | ICD-10-CM | POA: Diagnosis not present

## 2017-08-06 DIAGNOSIS — N3010 Interstitial cystitis (chronic) without hematuria: Secondary | ICD-10-CM | POA: Diagnosis not present

## 2017-08-06 DIAGNOSIS — F411 Generalized anxiety disorder: Secondary | ICD-10-CM | POA: Diagnosis not present

## 2017-08-08 DIAGNOSIS — I10 Essential (primary) hypertension: Secondary | ICD-10-CM | POA: Diagnosis not present

## 2017-08-08 DIAGNOSIS — N3010 Interstitial cystitis (chronic) without hematuria: Secondary | ICD-10-CM | POA: Diagnosis not present

## 2017-08-13 DIAGNOSIS — N3010 Interstitial cystitis (chronic) without hematuria: Secondary | ICD-10-CM | POA: Diagnosis not present

## 2017-08-21 ENCOUNTER — Ambulatory Visit: Payer: Medicare Other | Admitting: Infectious Diseases

## 2017-08-27 ENCOUNTER — Ambulatory Visit: Payer: Medicare Other | Admitting: Infectious Diseases

## 2017-09-04 ENCOUNTER — Ambulatory Visit: Payer: Medicare Other | Admitting: Infectious Diseases

## 2017-09-11 DIAGNOSIS — R42 Dizziness and giddiness: Secondary | ICD-10-CM | POA: Diagnosis not present

## 2017-09-11 DIAGNOSIS — J310 Chronic rhinitis: Secondary | ICD-10-CM | POA: Diagnosis not present

## 2017-09-16 DIAGNOSIS — N3010 Interstitial cystitis (chronic) without hematuria: Secondary | ICD-10-CM | POA: Diagnosis not present

## 2017-09-18 ENCOUNTER — Telehealth: Payer: Self-pay | Admitting: Neurology

## 2017-09-18 ENCOUNTER — Encounter: Payer: Self-pay | Admitting: Neurology

## 2017-09-18 ENCOUNTER — Other Ambulatory Visit: Payer: Self-pay

## 2017-09-18 ENCOUNTER — Ambulatory Visit (INDEPENDENT_AMBULATORY_CARE_PROVIDER_SITE_OTHER): Payer: Medicare Other | Admitting: Neurology

## 2017-09-18 VITALS — BP 191/80 | HR 54 | Ht 64.0 in | Wt 197.5 lb

## 2017-09-18 DIAGNOSIS — R42 Dizziness and giddiness: Secondary | ICD-10-CM | POA: Diagnosis not present

## 2017-09-18 DIAGNOSIS — H81399 Other peripheral vertigo, unspecified ear: Secondary | ICD-10-CM

## 2017-09-18 NOTE — Telephone Encounter (Signed)
medicare/medicaid order sent to GI. No auth they will reach out to the pt to schedule.  °

## 2017-09-18 NOTE — Progress Notes (Signed)
Reason for visit: Vertigo, headache  Referring physician: Dr. Sharin Mons is a 72 y.o. female  History of present illness:  [name] is a 72 year old right-handed white female with a history of fibromyalgia and chronic fatigue.  The patient indicates that she has had some problems with vertigo in the past, the first episode was in 2015 associated with sinusitis.  The patient had been doing well until March 2017 when she suddenly fell in her hotel room and hit her head in the tub on the left frontotemporal region.  The patient did not lose consciousness, but immediately after the fall she had severe dizziness, and gait instability.  The patient underwent a CT scan of the brain that did not show evidence of any intracranial abnormalities following the fall.  The patient unfortunately has had ongoing symptoms of vertigo off and on since that time.  She also reports episodes of headaches that may be present on average 5 days of the month.  The patient may indicate that if she has a severe episode of vertigo she may have a mild headache that persist for day or 2 afterwards.  She does note some nausea with the vertigo.  She has congenital nystagmus.  The patient has not had any syncopal events.  She has undergone the Epley maneuvers that she has done on her own without much benefit.  The patient does not operate a motor vehicle currently.  She will take Zofran for the nausea at times.  She is on meclizine on a regular basis.  The patient indicates that she will often times get vertigo if she rolls onto her left side while sleeping at night or if she flexes her head down when she is standing up.  The patient does report some bilateral tinnitus, she denies any ear pain.  She has not lost any hearing.  The patient is sent to this office for an evaluation.  She does report some neck stiffness, no severe neck pain.  Past Medical History:  Diagnosis Date  . Acute meniscal tear of left knee    FOLLOWED BY DR GIAFFREY  . BPPV (benign paroxysmal positional vertigo)   . Chronic bladder pain   . Chronic fatigue syndrome   . Chronic low back pain    W/ RIGHT LEG PAIN AND NUMBNESS  . Cystitis, chronic   . Fibromyalgia   . GERD (gastroesophageal reflux disease)   . Hiatal hernia   . Hypertension   . IC (interstitial cystitis)   . OSA (obstructive sleep apnea)    per pt study yrs ago-- moderate osa ,  intolerant cpap  . Pinched vertebral nerve    bilateral L2 -- L3 and L3 -- L4-/  epidural injection's treatment , PT and pain clince  . PONV (postoperative nausea and vomiting)    severe  . 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 Self-catheterizes urinary bladder   . Spinal stenosis, lumbar region with neurogenic claudication   . Wears glasses   . Wears partial dentures    upper and lower    Past Surgical History:  Procedure Laterality Date  . ABDOMINAL HYSTERECTOMY  1980   w/ Left Salpingoophorectomy  . CARDIAC CATHETERIZATION  08-21-2001  dr Verdis Prime   normal coronary arteries and LVF  . CARPAL TUNNEL RELEASE Bilateral 1986  . CECALCYSTOPLASTY/  APPENDECTOMY  1984   [facility]  . CHOLECYSTECTOMY  1992  .  CYSTO WITH HYDRODISTENSION N/A 01/12/2016   Procedure: CYSTOSCOPY/HYDRODISTENSION;  Surgeon: Jethro Bolus, MD;  Location: [facility];  Service: Urology;  Laterality: N/A;  . CYSTO WITH HYDRODISTENSION N/A 08/30/2016   Procedure: CYSTOSCOPY/HYDRODISTENSION OF BLADDER INJECTION OF MARCAINE/PYRIDIUM;  Surgeon: Jethro Bolus, MD;  Location[facility];  Service: Urology;  Laterality: N/A;  . MULTIPLE CYSTO/  HYDRODISTENTION/  INSTILLATION THERAPY  last one 08-19-2009  . NASAL SINUS SURGERY  1987  . TONSILLECTOMY  as child  . VAGINAL GROWTH REMOVED  as teen    Family History  Problem Relation Age of Onset  . CAD Father   . Asthma Father   . Hypertension Father   . Alzheimer's disease  Father   . Emphysema Father   . COPD Father   . Heart failure Mother   . Peripheral vascular disease Mother   . Stroke Mother   . Hypertension Mother   . Colonic polyp Mother   . Cancer Brother        melanoma    Social history:  reports that she has never smoked. She has never used smokeless tobacco. She reports that she does not drink alcohol or use drugs.  Medications:  Prior to Admission medications   Medication Sig Start Date End Date Taking? Authorizing Provider  DENTA 5000 PLUS 1.1 % CREA dental cream  09/16/17  Yes [provider]  dextromethorphan-guaiFENesin (MUCINEX DM) 30-600 MG 12hr tablet Take 1 tablet by mouth 2 (two) times daily as needed for cough.   Yes [provider]  fexofenadine (ALLEGRA) 60 MG tablet Take 60 mg by mouth daily. Reported on 05/03/2015 01/06/14  Yes [provider]  HYDROcodone-acetaminophen (NORCO) 7.5-325 MG tablet Take 1 tablet by mouth every 8 (eight) hours as needed.  02/07/15  Yes [provider]  losartan (COZAAR) 50 MG tablet Take 50 mg by mouth daily. 08/12/17  Yes [provider]  meclizine (ANTIVERT) 25 MG tablet Take 25 mg by mouth 3 (three) times daily as needed.    Yes [provider]  Meth-Hyo-M Bl-Benz Acd-Ph Sal 81.6 MG TABS Take 1 tablet (81.6 mg total) by mouth 2 (two) times daily as needed. 06/07/17  Yes Ginnie Smart, MD  methenamine (HIPREX) 1 G tablet Take by mouth as directed. TAKES WHEN NOT TAKING ANTIBIOTICS   Yes [provider]  nebivolol (BYSTOLIC) 10 MG tablet Take 10 mg by mouth every morning.  01/08/14  Yes [provider]  nystatin cream (MYCOSTATIN) APPLY TO AFFECTED AREA TWICE A DAY---  PRN 02/04/15  Yes [provider]  omeprazole (PRILOSEC) 20 MG capsule 1 capsule ONE TO TWO TIMES DAILY 02/08/14  Yes [provider]  ondansetron (ZOFRAN-ODT) 4 MG disintegrating tablet Take 1 tablet by mouth every 8 (eight) hours as needed. 07/01/17   Yes [provider]  pentosan polysulfate (ELMIRON) 100 MG capsule 100 mg. WEEKLY PRN BLADDER INSTILLATION AT DR Patsi Sears OFFICE   Yes [provider]  sucralfate (CARAFATE) 1 G tablet Take 1 g by mouth 2 (two) times daily.    Yes [provider]  triamcinolone cream (KENALOG) 0.1 % BID PRN FOR ECZEMA 02/03/15  Yes [provider]      Allergies  Allergen Reactions  . Avelox [Moxifloxacin] Hives, Shortness Of Breath and Swelling  . Quinolones Anaphylaxis  . Sulfa Antibiotics Hives  . Asa [Aspirin] Diarrhea and Other (See Comments)    GI upset  . Dmso [Dimethyl Sulfoxide] Itching  . Ibuprofen  Other (See Comments)    GI upset  . Macrodantin [Nitrofurantoin Macrocrystal]     Abdominal and chest pain. Itching.  . Nitrofurantoin Diarrhea    Gi upset  . Adhesive [Tape] Rash  . Codeine Hives  . Yellow Dye Rash    ROS:  Out of a complete 14 system review of symptoms, the patient complains only of the following symptoms, and all other reviewed systems are negative.  Chills, fatigue Palpitations of the heart, heart murmur Ringing in the ears, dizziness Moles Blurred vision Diarrhea, constipation Urination problems Easy bruising Feeling hot Joint swelling, muscle cramps, aching muscles Allergies, runny nose, skin sensitivity, frequent infections Headache, weakness, dizziness Anxiety, not enough sleep, decreased energy Insomnia, sleepiness  Blood pressure (!) 191/80, pulse (!) 54, height 5\' 4"  (1.626 m), weight 197 lb 8 oz (89.6 kg), SpO2 98 %.  Physical Exam  General: The patient is alert and cooperative at the time of the examination.  Eyes: Pupils are equal, round, and reactive to light. Discs are flat bilaterally.  The patient has horizontal nystagmus on primary gaze.  Ears: Tympanic membranes are clear bilaterally.  Neck: The neck is supple, no carotid bruits are noted.  Respiratory: The respiratory examination is  clear.  Cardiovascular: The cardiovascular examination reveals a regular rate and rhythm, no obvious murmurs or rubs are noted.  Skin: Extremities are without significant edema.  Neurologic Exam  Mental status: The patient is alert and oriented x 3 at the time of the examination. The patient has apparent normal recent and remote memory, with an apparently normal attention span and concentration ability.  Cranial nerves: Facial symmetry is present. There is good sensation of the face to pinprick and soft touch bilaterally. The strength of the facial muscles and the muscles to head turning and shoulder shrug are normal bilaterally. Speech is well enunciated, no aphasia or dysarthria is noted. Extraocular movements are full. Visual fields are full. The tongue is midline, and the patient has symmetric elevation of the soft palate. No obvious hearing deficits are noted.  Motor: The motor testing reveals 5 over 5 strength of all 4 extremities. Good symmetric motor tone is noted throughout.  Sensory: Sensory testing is intact to pinprick, soft touch, vibration sensation, and position sense on all 4 extremities. No evidence of extinction is noted.  Coordination: Cerebellar testing reveals good finger-nose-finger and heel-to-shin bilaterally.  Gait and station: Gait is normal. Tandem gait is slightly unsteady. Romberg is negative. No drift is seen.  Reflexes: Deep tendon reflexes are symmetric and normal bilaterally. Toes are downgoing bilaterally.   CT head 05/30/17:  IMPRESSION: 1. No acute intracranial process. 2. Stable examination including moderate parenchymal brain volume loss.  * CT scan images were reviewed online. I agree with the written report.   Assessment/Plan:  1.  Posttraumatic vertigo  2.  Posttraumatic headache  The patient has had some positional type vertigo that has occurred following head trauma in March 2017.  She is also having episodic headaches.  The patient  will be sent for MRI of the brain, she will be sent for physical therapy for vestibular rehabilitation.  If the rehabilitation is not effective, I will initiate Topamax therapy for migraine to see if this improves both the headache and vertigo.  The patient will follow-up in 4 months.  Marlan Palau. Keith Naje Rice MD 09/18/2017 12:07 PM  [facility] [address] [place], [place] [address]  Phone [phone] Fax [phone]

## 2017-09-18 NOTE — Patient Instructions (Signed)
We will get MRI of the brain and start vestibular rehab.

## 2017-09-19 ENCOUNTER — Encounter: Payer: Self-pay | Admitting: Infectious Diseases

## 2017-09-19 ENCOUNTER — Ambulatory Visit (INDEPENDENT_AMBULATORY_CARE_PROVIDER_SITE_OTHER): Payer: Medicare Other | Admitting: Infectious Diseases

## 2017-09-19 DIAGNOSIS — N3090 Cystitis, unspecified without hematuria: Secondary | ICD-10-CM | POA: Diagnosis not present

## 2017-09-19 NOTE — Progress Notes (Signed)
   Subjective:    Patient ID: Jennifer ReamerBarbara E Gabler, female    DOB: [birth year], 72 y.o.   MRN: [id number]  HPI 72yo F with hxHTN,fibromyalgia, gerd, possible IBS, [facility] She had 12 months of bladder infection afterwards. 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 She self cath's and has had difficulty keeping with boiling her cath's.  Has had multiple UTI with E coli as well as P mirabilis. She was found on CT2016to have small kidney stone.   Previouslyhas been on macrobid, kelfex (abd rash/fungus), IV gent (01-2015), augmentin. As well ascranberry extract.  UCx (03-14-15) E coli- ESBL, (S- augmentin, zosyn, imipenem, gent/tobra, nitro).  She was seen in [place] 03-2015 and was given prn rx for augmentin as well as education on UTI and colonization.  She had UCx 11-7 E coli (> 100k) S- unasyn/augmentin, amikacin/gent, Ceftaz/Cefepime/cefoxitin, imi/mero/ertapenem, bactrim, zosyn, nitro.  then ED on 01-2017 for UTI. UA (-) for LE and Nitr. + bacteria. She was given fosfomycin.    Her cx grew E coli- ESBL.  s- nitro, augmentin, gent, imipenem.       She continues to get bladder PT at [facility]. Has f/u with Dr Logan BoresEvans on 07-04-17.   She had repeat UCx 05-17-17 which grew same E coli.  She had more pain, burning, hesitancy, then started on 2-22 augmentin.  Since seen in March, she has been worked up for vertigo. Has been seen by Neuro. Has MRI scheduled for 6-30.  Still getting treatments with Dr Logan BoresEvans, BIW. Mostly better.  Has had 1 episode where she needed fosamax tx since her last visit.   Still living in apt, stable.    Review of Systems  Constitutional: Negative for appetite change, chills, fever and unexpected weight change.  Gastrointestinal: Negative for constipation and diarrhea.  Endocrine: Negative for polyuria.  Genitourinary: Negative for difficulty urinating, dysuria and hematuria.    Neurological: Positive for dizziness. Negative for headaches.  had UCx done at her last Uro appt (frequency, supra-pubic pain), pending. Mild mucous, baseline level of cloudiness.  Her sx have since stopped.  Please see HPI. All other systems reviewed and negative.     Objective:   Physical Exam  Constitutional: She appears well-developed and well-nourished.  HENT:  Mouth/Throat: No oropharyngeal exudate.  Eyes: Pupils are equal, round, and reactive to light. EOM are normal.  Neck: Normal range of motion. Neck supple.  Cardiovascular: Normal rate, regular rhythm and normal heart sounds.  Pulmonary/Chest: Effort normal and breath sounds normal.  Abdominal: Soft. Bowel sounds are normal. She exhibits no distension. There is no tenderness. There is no CVA tenderness.      Assessment & Plan:

## 2017-09-19 NOTE — Assessment & Plan Note (Signed)
She will let me know if she has persistent sx Will hold on giving her anbx at this point as her sx are better.  Greatly appreciate Dr Logan BoresEvans f/u.  Has Uro f/u 10-08-17

## 2017-09-20 DIAGNOSIS — N3010 Interstitial cystitis (chronic) without hematuria: Secondary | ICD-10-CM | POA: Diagnosis not present

## 2017-09-23 ENCOUNTER — Telehealth: Payer: Self-pay | Admitting: *Deleted

## 2017-09-23 NOTE — Telephone Encounter (Signed)
Patient called to advise she was placed on Amoxicillian 500-125 mg 1 tablet 3 times daily for 7 days and she just wanted Dr Ninetta LightsHatcher to know. Advised her will make sure he gets the information.

## 2017-09-25 ENCOUNTER — Other Ambulatory Visit: Payer: Medicare Other

## 2017-09-27 ENCOUNTER — Other Ambulatory Visit: Payer: Medicare Other

## 2017-09-30 ENCOUNTER — Ambulatory Visit: Payer: Medicare Other | Admitting: Rehabilitative and Restorative Service Providers"

## 2017-10-01 ENCOUNTER — Other Ambulatory Visit: Payer: Medicare Other

## 2017-10-07 ENCOUNTER — Inpatient Hospital Stay: Admission: RE | Admit: 2017-10-07 | Payer: Medicare Other | Source: Ambulatory Visit

## 2017-10-08 DIAGNOSIS — N3010 Interstitial cystitis (chronic) without hematuria: Secondary | ICD-10-CM | POA: Diagnosis not present

## 2017-10-08 DIAGNOSIS — B9620 Unspecified Escherichia coli [E. coli] as the cause of diseases classified elsewhere: Secondary | ICD-10-CM | POA: Diagnosis not present

## 2017-10-08 DIAGNOSIS — N390 Urinary tract infection, site not specified: Secondary | ICD-10-CM | POA: Diagnosis not present

## 2017-10-09 ENCOUNTER — Inpatient Hospital Stay: Admission: RE | Admit: 2017-10-09 | Payer: Medicare Other | Source: Ambulatory Visit

## 2017-10-10 ENCOUNTER — Ambulatory Visit
Admission: RE | Admit: 2017-10-10 | Discharge: 2017-10-10 | Disposition: A | Discharge status: Home / Self Care | Payer: Medicare Other | Source: Ambulatory Visit | Attending: Neurology | Admitting: Neurology

## 2017-10-10 DIAGNOSIS — R42 Dizziness and giddiness: Secondary | ICD-10-CM | POA: Diagnosis not present

## 2017-10-10 DIAGNOSIS — F411 Generalized anxiety disorder: Secondary | ICD-10-CM | POA: Diagnosis not present

## 2017-10-11 DIAGNOSIS — N3010 Interstitial cystitis (chronic) without hematuria: Secondary | ICD-10-CM | POA: Diagnosis not present

## 2017-10-14 ENCOUNTER — Telehealth: Payer: Self-pay

## 2017-10-14 ENCOUNTER — Encounter: Payer: Medicare Other | Admitting: Rehabilitative and Restorative Service Providers"

## 2017-10-14 NOTE — Telephone Encounter (Signed)
I called pt, had an extended conversation with her. I discussed that Dr. Anne HahnWillis is out of the office but that Dr. Frances FurbishAthar reviewed her MRI results, found no explanation for her vertigo, and overall age appropriate findings. I explained that pt had mild atrophy with minimal white matter changes. I explained the conditions that contribute to white matter changes, and reinforced the importance of managing these chronic conditions. Pt starts PT tomorrow for her vertigo and will let us know if her vertigo does not improve with PT. Pt verbalized understanding of results.

## 2017-10-14 NOTE — Progress Notes (Signed)
Please call patient regarding the recent brain MRI: The brain scan showed a normal structure of the brain and mild volume loss which we call atrophy. There were changes in the deeper structures of the brain, which we call white matter changes or microvascular changes. These were reported as minimal in Her case. These are tiny white spots, that occur with time and are seen in a variety of conditions, including with normal aging, chronic hypertension, chronic headaches, especially migraine HAs, chronic diabetes, chronic hyperlipidemia. These are not strokes and no mass or lesion were seen which is reassuring. Again, there were no acute findings, such as a stroke, or mass or blood products. No further action is required on this test at this time, other than re-enforcing the importance of good blood pressure control, good cholesterol control, good blood sugar control, and weight management. Please remind patient to keep any upcoming appointments or tests and to call us with any interim questions, concerns, problems or updates.   No explanation of her vertigo, overall, age-appropriate findings.  Thanks,  Huston FoleySaima Mekhi Sonn, MD, PhD

## 2017-10-14 NOTE — Telephone Encounter (Signed)
-----   Message from Huston FoleySaima Athar, MD sent at 10/14/2017 11:57 AM EDT ----- Please call patient regarding the recent brain MRI: The brain scan showed a normal structure of the brain and mild volume loss which we call atrophy. There were changes in the deeper structures of the brain, which we call white matter changes or microvascular changes. These were reported as minimal in Her case. These are tiny white spots, that occur with time and are seen in a variety of conditions, including with normal aging, chronic hypertension, chronic headaches, especially migraine HAs, chronic diabetes, chronic hyperlipidemia. These are not strokes and no mass or lesion were seen which is reassuring. Again, there were no acute findings, such as a stroke, or mass or blood products. No further action is required on this test at this time, other than re-enforcing the importance of good blood pressure control, good cholesterol control, good blood sugar control, and weight management. Please remind patient to keep any upcoming appointments or tests and to call us with any interim questions, concerns, problems or updates.    No explanation of her vertigo, overall, age-appropriate findings.  Thanks,  Huston FoleySaima Athar, MD, PhD

## 2017-10-15 ENCOUNTER — Ambulatory Visit: Payer: Medicare Other | Admitting: Physical Therapy

## 2017-10-23 DIAGNOSIS — N3000 Acute cystitis without hematuria: Secondary | ICD-10-CM | POA: Diagnosis not present

## 2017-10-24 ENCOUNTER — Ambulatory Visit: Payer: Medicare Other | Attending: Neurology | Admitting: Rehabilitative and Restorative Service Providers"

## 2017-10-24 ENCOUNTER — Encounter: Payer: Self-pay | Admitting: Rehabilitative and Restorative Service Providers"

## 2017-10-24 DIAGNOSIS — R2689 Other abnormalities of gait and mobility: Secondary | ICD-10-CM | POA: Diagnosis not present

## 2017-10-24 DIAGNOSIS — R42 Dizziness and giddiness: Secondary | ICD-10-CM | POA: Diagnosis not present

## 2017-10-24 DIAGNOSIS — H8112 Benign paroxysmal vertigo, left ear: Secondary | ICD-10-CM | POA: Diagnosis not present

## 2017-10-25 ENCOUNTER — Telehealth: Payer: Self-pay | Admitting: Rehabilitative and Restorative Service Providers"
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
## 2017-10-25 NOTE — Telephone Encounter (Signed)
The patient had 2 questions after yesterday's evaluation.   1) should I take meclizine before treatment:  PT recommends she takes it if she needs it to feel safe and how prescribed. 2) She inquired about zofran use.  PT recommended she take meds as prescribed, as needed.   She felt queasy after yesterday's treatment.     Akia Montalban, PT

## 2017-10-28 ENCOUNTER — Ambulatory Visit: Payer: Medicare Other | Admitting: Rehabilitative and Restorative Service Providers"

## 2017-10-28 ENCOUNTER — Encounter: Payer: Self-pay | Admitting: Rehabilitative and Restorative Service Providers"

## 2017-10-28 DIAGNOSIS — R2689 Other abnormalities of gait and mobility: Secondary | ICD-10-CM | POA: Diagnosis not present

## 2017-10-28 DIAGNOSIS — R42 Dizziness and giddiness: Secondary | ICD-10-CM

## 2017-10-28 DIAGNOSIS — H8112 Benign paroxysmal vertigo, left ear: Secondary | ICD-10-CM | POA: Diagnosis not present

## 2017-10-28 NOTE — Patient Instructions (Signed)
Access Code: [email or web address]  URL: [email or web address]  Date: 10/28/2017  Prepared by: Margretta Dittyhristina Izaiah Tabb   Exercises Brandt-Daroff Vestibular Exercise - 5 reps - 3x daily - 7x weekly Rolling right<>left sides for vestibular habituation - 5 reps - 1x daily - 7x weekly
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
## 2017-10-31 DIAGNOSIS — J320 Chronic maxillary sinusitis: Secondary | ICD-10-CM | POA: Diagnosis not present

## 2017-10-31 DIAGNOSIS — H8143 Vertigo of central origin, bilateral: Secondary | ICD-10-CM | POA: Diagnosis not present

## 2017-10-31 DIAGNOSIS — J322 Chronic ethmoidal sinusitis: Secondary | ICD-10-CM | POA: Diagnosis not present

## 2017-11-04 ENCOUNTER — Ambulatory Visit: Payer: Medicare Other | Admitting: Physical Therapy

## 2017-11-04 DIAGNOSIS — M48062 Spinal stenosis, lumbar region with neurogenic claudication: Secondary | ICD-10-CM | POA: Diagnosis not present

## 2017-11-04 DIAGNOSIS — M25562 Pain in left knee: Secondary | ICD-10-CM | POA: Diagnosis not present

## 2017-11-04 DIAGNOSIS — M25561 Pain in right knee: Secondary | ICD-10-CM | POA: Diagnosis not present

## 2017-11-04 DIAGNOSIS — M47816 Spondylosis without myelopathy or radiculopathy, lumbar region: Secondary | ICD-10-CM | POA: Diagnosis not present

## 2017-11-07 DIAGNOSIS — F411 Generalized anxiety disorder: Secondary | ICD-10-CM | POA: Diagnosis not present

## 2017-11-08 DIAGNOSIS — N3010 Interstitial cystitis (chronic) without hematuria: Secondary | ICD-10-CM | POA: Diagnosis not present

## 2017-11-11 ENCOUNTER — Encounter: Payer: Self-pay | Admitting: Rehabilitative and Restorative Service Providers"

## 2017-11-11 ENCOUNTER — Ambulatory Visit: Payer: Medicare Other | Attending: Neurology | Admitting: Rehabilitative and Restorative Service Providers"

## 2017-11-11 DIAGNOSIS — R2689 Other abnormalities of gait and mobility: Secondary | ICD-10-CM | POA: Diagnosis not present

## 2017-11-11 DIAGNOSIS — R42 Dizziness and giddiness: Secondary | ICD-10-CM | POA: Insufficient documentation

## 2017-11-11 DIAGNOSIS — N3010 Interstitial cystitis (chronic) without hematuria: Secondary | ICD-10-CM | POA: Diagnosis not present
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
## 2017-11-11 NOTE — Patient Instructions (Signed)
Access Code: [email or web address]  URL: [email or web address]  Date: 11/11/2017  Prepared by: Margretta Dittyhristina Jameela Michna   Exercises Brandt-Daroff Vestibular Exercise - 5 reps - 3x daily - 7x weekly Romberg Stance with Eyes Closed - 3 reps - 1 sets - 30 seconds hold - 1x daily - 7x weekly Standing Gaze Stabilization with Head Rotation - 1 reps - 1 sets - 2-3x daily - 7x weekly Standing Head Nod Vestibular Habituation - 5 reps - 1 sets - 2x daily - 7x weekly

## 2017-11-13 DIAGNOSIS — G44309 Post-traumatic headache, unspecified, not intractable: Secondary | ICD-10-CM | POA: Diagnosis not present

## 2017-11-13 DIAGNOSIS — E559 Vitamin D deficiency, unspecified: Secondary | ICD-10-CM | POA: Diagnosis not present

## 2017-11-13 DIAGNOSIS — I10 Essential (primary) hypertension: Secondary | ICD-10-CM | POA: Diagnosis not present

## 2017-11-13 DIAGNOSIS — Z6835 Body mass index (BMI) 35.0-35.9, adult: Secondary | ICD-10-CM | POA: Diagnosis not present

## 2017-11-13 DIAGNOSIS — R42 Dizziness and giddiness: Secondary | ICD-10-CM | POA: Diagnosis not present

## 2017-11-13 DIAGNOSIS — N390 Urinary tract infection, site not specified: Secondary | ICD-10-CM | POA: Diagnosis not present

## 2017-11-18 ENCOUNTER — Ambulatory Visit: Payer: Medicare Other | Admitting: Physical Therapy

## 2017-11-22 DIAGNOSIS — N3010 Interstitial cystitis (chronic) without hematuria: Secondary | ICD-10-CM | POA: Diagnosis not present

## 2017-11-25 ENCOUNTER — Encounter: Payer: Self-pay | Admitting: Rehabilitative and Restorative Service Providers"

## 2017-11-25 ENCOUNTER — Ambulatory Visit: Payer: Medicare Other | Admitting: Rehabilitative and Restorative Service Providers"

## 2017-11-25 VITALS — HR 54

## 2017-11-25 DIAGNOSIS — R2689 Other abnormalities of gait and mobility: Secondary | ICD-10-CM

## 2017-11-25 DIAGNOSIS — R42 Dizziness and giddiness: Secondary | ICD-10-CM

## 2017-11-25 DIAGNOSIS — N3010 Interstitial cystitis (chronic) without hematuria: Secondary | ICD-10-CM | POA: Diagnosis not present

## 2017-11-25 NOTE — Patient Instructions (Signed)
Access Code: [email or web address]  URL: [email or web address]  Date: 11/25/2017  Prepared by: Margretta Dittyhristina Cruz Bong   Exercises Brandt-Daroff Vestibular Exercise - 2-3 reps - 2x daily - 7x weekly Romberg Stance with Eyes Closed - 3 reps - 1 sets - 30 seconds hold - 1x daily - 7x weekly Standing Gaze Stabilization with Head Rotation - 1 reps - 1 sets - 2-3x daily - 7x weekly Standing Head Nod Vestibular Habituation - 5 reps - 1 sets - 2x daily - 7x weekly
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
## 2017-12-06 DIAGNOSIS — F411 Generalized anxiety disorder: Secondary | ICD-10-CM | POA: Diagnosis not present

## 2017-12-09 DIAGNOSIS — R110 Nausea: Secondary | ICD-10-CM | POA: Diagnosis not present

## 2017-12-09 DIAGNOSIS — R198 Other specified symptoms and signs involving the digestive system and abdomen: Secondary | ICD-10-CM | POA: Diagnosis not present

## 2017-12-09 DIAGNOSIS — N3010 Interstitial cystitis (chronic) without hematuria: Secondary | ICD-10-CM | POA: Diagnosis not present

## 2017-12-12 ENCOUNTER — Ambulatory Visit: Payer: Medicare Other | Attending: Neurology | Admitting: Rehabilitative and Restorative Service Providers"

## 2017-12-12 ENCOUNTER — Encounter: Payer: Self-pay | Admitting: Rehabilitative and Restorative Service Providers"

## 2017-12-12 DIAGNOSIS — R42 Dizziness and giddiness: Secondary | ICD-10-CM | POA: Diagnosis not present

## 2017-12-12 DIAGNOSIS — H8112 Benign paroxysmal vertigo, left ear: Secondary | ICD-10-CM | POA: Insufficient documentation

## 2017-12-12 DIAGNOSIS — N3010 Interstitial cystitis (chronic) without hematuria: Secondary | ICD-10-CM | POA: Diagnosis not present

## 2017-12-12 DIAGNOSIS — R2689 Other abnormalities of gait and mobility: Secondary | ICD-10-CM | POA: Diagnosis not present
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
## 2017-12-13 ENCOUNTER — Ambulatory Visit: Payer: Medicare Other | Admitting: Rehabilitative and Restorative Service Providers"

## 2017-12-19 ENCOUNTER — Encounter: Payer: Medicare Other | Admitting: Rehabilitative and Restorative Service Providers"

## 2017-12-19 DIAGNOSIS — N3010 Interstitial cystitis (chronic) without hematuria: Secondary | ICD-10-CM | POA: Diagnosis not present

## 2017-12-24 DIAGNOSIS — N3010 Interstitial cystitis (chronic) without hematuria: Secondary | ICD-10-CM | POA: Diagnosis not present

## 2017-12-26 DIAGNOSIS — N3010 Interstitial cystitis (chronic) without hematuria: Secondary | ICD-10-CM | POA: Diagnosis not present

## 2017-12-27 ENCOUNTER — Ambulatory Visit: Payer: Medicare Other | Admitting: Rehabilitative and Restorative Service Providers"

## 2017-12-27 ENCOUNTER — Telehealth: Payer: Self-pay | Admitting: Rehabilitative and Restorative Service Providers"

## 2017-12-27 NOTE — Telephone Encounter (Addendum)
Patient had called earlier today- message received via in basket.  Returned call: Patient reports her ride cancelled on her today.  She notes that vertigo returned the end of last week and lasted for 10 seconds with a sensation of room spinning.   PT recommended that she focus on brandt daroff exercise that has been previously prescribed.  We were able to add a visit on 10/17, and will add patient to wait list to see sooner if able.   Luismiguel Lamere, PT

## 2018-01-02 ENCOUNTER — Ambulatory Visit: Payer: Medicare Other | Admitting: Psychiatry

## 2018-01-03 ENCOUNTER — Ambulatory Visit: Payer: Medicare Other | Admitting: Rehabilitative and Restorative Service Providers"

## 2018-01-07 DIAGNOSIS — N3010 Interstitial cystitis (chronic) without hematuria: Secondary | ICD-10-CM | POA: Diagnosis not present

## 2018-01-16 ENCOUNTER — Ambulatory Visit: Payer: Medicare Other | Admitting: Rehabilitative and Restorative Service Providers"

## 2018-01-16 DIAGNOSIS — R399 Unspecified symptoms and signs involving the genitourinary system: Secondary | ICD-10-CM | POA: Diagnosis not present

## 2018-01-17 DIAGNOSIS — M25561 Pain in right knee: Secondary | ICD-10-CM | POA: Diagnosis not present

## 2018-01-22 ENCOUNTER — Ambulatory Visit: Payer: Medicare Other | Attending: Neurology | Admitting: Rehabilitative and Restorative Service Providers"

## 2018-01-22 ENCOUNTER — Encounter: Payer: Self-pay | Admitting: Rehabilitative and Restorative Service Providers"

## 2018-01-22 DIAGNOSIS — H8112 Benign paroxysmal vertigo, left ear: Secondary | ICD-10-CM

## 2018-01-22 DIAGNOSIS — R42 Dizziness and giddiness: Secondary | ICD-10-CM

## 2018-01-22 DIAGNOSIS — R2689 Other abnormalities of gait and mobility: Secondary | ICD-10-CM

## 2018-01-22 NOTE — Patient Instructions (Addendum)
Continue exercises as needed for management of vertigo.  For sit<>sidelying (brandt daroff exercises on home program):  Return to doing these ONLY if you have positional vertigo.  Continue doing the exercise until you have 2 days without dizziness.  Access Code: [email or web address]  URL: [email or web address]  Date: 01/22/2018  Prepared by: Margretta Ditty   Exercises Brandt-Daroff Vestibular Exercise - 2-3 reps - 2x daily - 7x weekly Romberg Stance with Eyes Closed - 3 reps - 1 sets - 30 seconds hold - 1x daily - 7x weekly Standing Head Nod Vestibular Habituation - 5 reps - 1 sets - 2x daily - 7x weekly
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
## 2018-01-28 DIAGNOSIS — N3010 Interstitial cystitis (chronic) without hematuria: Secondary | ICD-10-CM | POA: Diagnosis not present

## 2018-01-31 ENCOUNTER — Ambulatory Visit: Payer: Medicare Other | Admitting: Psychiatry

## 2018-01-31 DIAGNOSIS — N3010 Interstitial cystitis (chronic) without hematuria: Secondary | ICD-10-CM | POA: Diagnosis not present

## 2018-02-03 ENCOUNTER — Ambulatory Visit (INDEPENDENT_AMBULATORY_CARE_PROVIDER_SITE_OTHER): Payer: Medicare Other | Admitting: Psychiatry

## 2018-02-03 DIAGNOSIS — M25561 Pain in right knee: Secondary | ICD-10-CM | POA: Diagnosis not present

## 2018-02-03 DIAGNOSIS — F4323 Adjustment disorder with mixed anxiety and depressed mood: Secondary | ICD-10-CM | POA: Diagnosis not present

## 2018-02-03 DIAGNOSIS — F411 Generalized anxiety disorder: Secondary | ICD-10-CM

## 2018-02-03 DIAGNOSIS — F341 Dysthymic disorder: Secondary | ICD-10-CM

## 2018-02-03 DIAGNOSIS — G894 Chronic pain syndrome: Secondary | ICD-10-CM | POA: Diagnosis not present

## 2018-02-03 DIAGNOSIS — M48062 Spinal stenosis, lumbar region with neurogenic claudication: Secondary | ICD-10-CM | POA: Diagnosis not present

## 2018-02-03 DIAGNOSIS — M47816 Spondylosis without myelopathy or radiculopathy, lumbar region: Secondary | ICD-10-CM | POA: Diagnosis not present

## 2018-02-03 DIAGNOSIS — M25562 Pain in left knee: Secondary | ICD-10-CM | POA: Diagnosis not present

## 2018-02-03 DIAGNOSIS — N390 Urinary tract infection, site not specified: Secondary | ICD-10-CM | POA: Diagnosis not present

## 2018-02-03 NOTE — Progress Notes (Signed)
[facility] Counselor/Therapist Progress Note   Patient ID: Jennifer [name], MRN: [id number]  Date: 02/03/2018  Start time: 11:22a Stop time: 12:23p Time Spent: 61 min  Treatment Type: Individual  Subjective:  Will have pre-op assessment at PCP later this week.  Arthroscopic surgery for torn meniscus and ganglion cyst right knee anticipated early December.  Pain management later today.  Recent [place] treatments at new urologist [phone]).  Infx disease next week.  Vertigo treatment with PT Margretta Ditty), and f/u with Neurologist Anne Hahn) soon.  C/o ties of burning sensation left side of face and forehead, attrib to a fall two and a half years ago, possibly cranial nerve damage and prolonged healing, possibly post-concussion effects (white spots found on MRI), and possibly to elevated BP, or a combination.  Three flights of stairs at apartment complex has been aggravating for her knees, but improved stamina.  Losing weight, which helps.  September was 4 years since mother's death, followed by loss of the house.  Friendship with [name] improved while PT diversifying her transportation needs. Worked through a very nervous episode of [name] as Chief Financial Officer, provided opportunity to assert herself without overdoing it, got [name]' acknowledgment.  Some worry over [name]' health, susp. of CVA, escorted her to urgent care.  Had good time attending Bible study at [facility], first time in years she has been formally involved in a church activity.  Rents a car a few days a month, difficult with budget, but makes sense balancing uncomfortable dependency on a single friend.  Interventions: Solution-Oriented/Positive Psychology and Ego-Supportive Oriented to EHR and rights and responsibilities attached.  PT is agreeable to sharing data especially with her primary care ([facility]) if possible.  Largely supportive listening, differentially reinforcing assertive, moderated responses  to known triggers for anxiety and sense of rejection.  Support provided for many medical issues, difficulties in scheduling amd logistics, weight loss, and further steady work on tempering her reactions to social provocations.  Mental Status Exam:    Appearance:   Neat and Well Groomed     Behavior:  Appropriate, Sharing and talkative  Motor:  Normal  Speech/Language:   Clear and Coherent  Affect:  Appropriate and Congruent  Mood:  normal and mildly tense  Thought process:  circumstantial  Thought content:    WNL  Sensory/Perceptual disturbances:    WNL  Orientation:  WNL  Attention:  Good  Concentration:  Good  Memory:  WNL  Fund of knowledge:   Good  Insight:    Good  Judgment:   Good  Impulse Control:  Good and Fair    Risk Assessment: Danger to Self:  No Self-injurious Behavior: No Danger to Others: No Duty to Warn:no Physical Aggression / Violence:No  Access to Firearms a concern: No  Gang Involvement:No   Diagnosis:   ICD-10-CM   1. Generalized anxiety disorder F41.1   2. Adjustment disorder with mixed anxiety and depressed mood F43.23   3. 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 Best wishes working through medical circumstances and continuing work with social service agencies where involved  . Continue to utilize previously learned skills ad lib . Maintain medication, if prescribed, and work faithfully with relevant prescriber(s) . Call the clinic on-call service, present to ER, or call 911 if any life-threatening emergency . Follow up with me in about a month    Robley Fries, PhD

## 2018-02-04 DIAGNOSIS — N3010 Interstitial cystitis (chronic) without hematuria: Secondary | ICD-10-CM | POA: Diagnosis not present

## 2018-02-07 DIAGNOSIS — Z23 Encounter for immunization: Secondary | ICD-10-CM | POA: Diagnosis not present

## 2018-02-07 DIAGNOSIS — I10 Essential (primary) hypertension: Secondary | ICD-10-CM | POA: Diagnosis not present

## 2018-02-07 DIAGNOSIS — M25561 Pain in right knee: Secondary | ICD-10-CM | POA: Diagnosis not present

## 2018-02-13 ENCOUNTER — Ambulatory Visit: Payer: Medicare Other | Admitting: Infectious Diseases

## 2018-02-18 DIAGNOSIS — N3010 Interstitial cystitis (chronic) without hematuria: Secondary | ICD-10-CM | POA: Diagnosis not present

## 2018-02-19 ENCOUNTER — Encounter: Payer: Self-pay | Admitting: Infectious Diseases

## 2018-02-19 ENCOUNTER — Ambulatory Visit (INDEPENDENT_AMBULATORY_CARE_PROVIDER_SITE_OTHER): Payer: Medicare Other | Admitting: Infectious Diseases

## 2018-02-19 DIAGNOSIS — M1711 Unilateral primary osteoarthritis, right knee: Secondary | ICD-10-CM | POA: Insufficient documentation

## 2018-02-19 DIAGNOSIS — M175 Other unilateral secondary osteoarthritis of knee: Secondary | ICD-10-CM | POA: Diagnosis not present

## 2018-02-19 DIAGNOSIS — N3090 Cystitis, unspecified without hematuria: Secondary | ICD-10-CM | POA: Diagnosis not present

## 2018-02-19 MED ORDER — FLUCONAZOLE 100 MG PO TABS: 100.0000 mg | ORAL_TABLET | Freq: Every day | ORAL | 0 refills | Status: DC

## 2018-02-19 MED ORDER — AMOXICILLIN-POT CLAVULANATE 875-125 MG PO TABS: 1.0000 | ORAL_TABLET | Freq: Two times a day (BID) | ORAL | 0 refills | Status: DC

## 2018-02-19 NOTE — Assessment & Plan Note (Signed)
Appreciate Dr Gioffre's f/u.  

## 2018-02-19 NOTE — Progress Notes (Signed)
   Subjective:    Patient ID: Jennifer [name], female    DOB: 05-16-1945, 72 y.o.   MRN: [id number]  HPI 72yo F with hxHTN,fibromyalgia, gerd, possible IBS, [facility] She had 12 months of bladder infection afterwards. 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 She self cath's and has had difficulty keeping with boiling her cath's.  Has had multiple UTI with E coli as well as P mirabilis. She was found on CT2016to have small kidney stone.   Previouslyhas been on macrobid, kelfex (abd rash/fungus), IV gent (01-2015), augmentin. As well ascranberry extract.  UCx (03-14-15) E coli- ESBL, (S- augmentin, zosyn, imipenem, gent/tobra, nitro).  She was seen in ID 03-2015 and was given prn rx for augmentin as well as education on UTI and colonization.  UCx 11-7 E coli (> 100k) S- unasyn/augmentin, amikacin/gent, Ceftaz/Cefepime/cefoxitin, imi/mero/ertapenem, bactrim, zosyn, nitro. UCx11-2018 for UTI. UA (-) for LE and Nitr. + bacteria. She was given fosfomycin.    Her cx grew E coli- ESBL.  s- nitro, augmentin, gent, imipenem.     UCx 05-2017 E coli (same sensi).  Shecontinues to get bladder PT at [facility].   Her course has also been complicated by homelessness. She now has her own apt.   She comes to ID today- she is to have R knee surgery for a torn meniscus, ganglion cyst.  She is to have this at surgical center, December.  Has been going to PT for her (positional) vertigo.  Her BP has improved after seeing her PCP.  Had MRI June 2019 for eval of her vertigo: 1.Mild generalized cortical atrophy. 2 Minimal chronic microvascular ischemic changes. 3. There are no acute findings.  She has lost weight.  She has no unsolicited complaints about her urine.  She was seen in uro yesterday, was having more pain, freq, odor. Had no fever. Her UCx was again E coli. She was given a course of augmentin 500mg   tid. Resolved. Her repeat UCx was 10-50K colonies.   Review of Systems  Constitutional: Negative for appetite change, chills, fever and unexpected weight change.  Gastrointestinal: Negative for constipation and diarrhea.  Genitourinary: Positive for difficulty urinating, dysuria and frequency.  Musculoskeletal: Positive for gait problem.  Neurological: Positive for dizziness. Negative for headaches.  Please see HPI. All other systems reviewed and negative.     Objective:   Physical Exam  Constitutional: She is oriented to person, place, and time. She appears well-developed and well-nourished.  HENT:  Mouth/Throat: No oropharyngeal exudate.  Eyes: Pupils are equal, round, and reactive to light. EOM are normal.  Neck: Normal range of motion. Neck supple.  Cardiovascular: Normal rate, regular rhythm and normal heart sounds.  Abdominal: Soft. Bowel sounds are normal. She exhibits no distension. There is no tenderness.  Musculoskeletal: She exhibits no edema.  Lymphadenopathy:    She has no cervical adenopathy.  Neurological: She is alert and oriented to person, place, and time.          Assessment & Plan:

## 2018-02-19 NOTE — Assessment & Plan Note (Addendum)
Will give her 1 week of augmentin to take prior to surgery.  She needs to have a carbapenem as one of her peri-operative antibiotics.  She seems to be at baseline now.  Will give her refill of diflucan to take prn for candidiasis with anbx  Will see her back in 6 months.

## 2018-02-26 ENCOUNTER — Ambulatory Visit (INDEPENDENT_AMBULATORY_CARE_PROVIDER_SITE_OTHER): Payer: Medicare Other | Admitting: Neurology

## 2018-02-26 ENCOUNTER — Encounter: Payer: Self-pay | Admitting: Neurology

## 2018-02-26 DIAGNOSIS — R42 Dizziness and giddiness: Secondary | ICD-10-CM | POA: Diagnosis not present

## 2018-02-26 NOTE — Progress Notes (Signed)
Reason for visit: Vertigo  Jennifer [name] is an 72 y.o. female  History of present illness:  Jennifer [name] is a 72 year old right-handed white female with a history of a fall with a bump to the head in March 2017.  The patient has had posttraumatic vertigo since that time, she clearly has a positional nature to the vertigo, when she lies down in bed she may get a 10-second episode of vertigo, she can activate the vertigo by rolling to the left side.  When she is gets up out of bed in the morning, she has to sit for a few moments to allow the vertigo to go away.  The patient has not had any falls, she does not have nausea or vomiting.  She has undergone vestibular rehabilitation with good benefit, but the patient will have to take meclizine on occasion for the vertigo.  She has not kept up with her vestibular exercises.  She has had gradual improvement in her headaches.  She has a mild left-sided headache on average twice a month, she does not have to take medication for this.  She returns to this office for an evaluation.  Past Medical History:  Diagnosis Date  . Acute meniscal tear of left knee    FOLLOWED BY DR GIAFFREY  . BPPV (benign paroxysmal positional vertigo)   . Chronic bladder pain   . Chronic fatigue syndrome   . Chronic low back pain    W/ RIGHT LEG PAIN AND NUMBNESS  . Cystitis, chronic   . Fibromyalgia   . GERD (gastroesophageal reflux disease)   . Hiatal hernia   . Hypertension   . IC (interstitial cystitis)   . OSA (obstructive sleep apnea)    per pt study yrs ago-- moderate osa ,  intolerant cpap  . Pinched vertebral nerve    bilateral L2 -- L3 and L3 -- L4-/  epidural injection's treatment , PT and pain clince  . PONV (postoperative nausea and vomiting)    severe  . 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 Self-catheterizes urinary bladder   . Spinal stenosis, lumbar region with neurogenic claudication   . Wears glasses   .  Wears partial dentures    upper and lower    Past Surgical History:  Procedure Laterality Date  . ABDOMINAL HYSTERECTOMY  1980   w/ Left Salpingoophorectomy  . CARDIAC CATHETERIZATION  08-21-2001  dr Verdis Prime   normal coronary arteries and LVF  . CARPAL TUNNEL RELEASE Bilateral 1986  . CECALCYSTOPLASTY/  APPENDECTOMY  1984   at [facility]  . CHOLECYSTECTOMY  1992  . CYSTO WITH HYDRODISTENSION N/A 01/12/2016   Procedure: CYSTOSCOPY/HYDRODISTENSION;  Surgeon: Jethro Bolus, MD;  Location[facility];  Service: Urology;  Laterality: N/A;  . CYSTO WITH HYDRODISTENSION N/A 08/30/2016   Procedure: CYSTOSCOPY/HYDRODISTENSION OF BLADDER INJECTION OF MARCAINE/PYRIDIUM;  Surgeon: Jethro Bolus, MD;  Location: [facility];  Service: Urology;  Laterality: N/A;  . MULTIPLE CYSTO/  HYDRODISTENTION/  INSTILLATION THERAPY  last one 08-19-2009  . NASAL SINUS SURGERY  1987  . TONSILLECTOMY  as child  . VAGINAL GROWTH REMOVED  as teen    Family History  Problem Relation Age of Onset  . CAD Father   . Asthma Father   . Hypertension Father   . Alzheimer's disease Father   . Emphysema Father   . COPD Father   . Heart failure Mother   .  Peripheral vascular disease Mother   . Stroke Mother   . Hypertension Mother   . Colonic polyp Mother   . Cancer Brother        melanoma    Social history:  reports that she has never smoked. She has never used smokeless tobacco. She reports that she does not drink alcohol or use drugs.    Allergies  Allergen Reactions  . Avelox [Moxifloxacin] Hives, Shortness Of Breath and Swelling  . Quinolones Anaphylaxis  . Sulfa Antibiotics Hives  . Asa [Aspirin] Diarrhea and Other (See Comments)    GI upset  . Dmso [Dimethyl Sulfoxide] Itching  . Ibuprofen Other (See Comments)    GI upset  . Nitrofurantoin Diarrhea    Gi upset  . Adhesive [Tape] Rash  . Codeine Hives  . Macrodantin [Nitrofurantoin Macrocrystal]  Nausea Only and Rash    Abdominal and chest pain. Itching.  . Yellow Dye Rash    Medications:  Prior to Admission medications   Medication Sig Start Date End Date Taking? Authorizing Provider  amoxicillin-clavulanate (AUGMENTIN) 875-125 MG tablet Take 1 tablet by mouth 2 (two) times daily for 7 days. 02/19/18 02/26/18 Yes Ginnie SmartHatcher, Jeffrey C, MD  DENTA 5000 PLUS 1.1 % CREA dental cream  09/16/17  Yes [provider]  dextromethorphan-guaiFENesin (MUCINEX DM) 30-600 MG 12hr tablet Take 1 tablet by mouth 2 (two) times daily as needed for cough.   Yes [provider]  fexofenadine (ALLEGRA) 60 MG tablet Take 60 mg by mouth daily. Reported on 05/03/2015 01/06/14  Yes [provider]  fluconazole (DIFLUCAN) 100 MG tablet Take 1 tablet (100 mg total) by mouth daily. 02/19/18  Yes Ginnie SmartHatcher, Jeffrey C, MD  HYDROcodone-acetaminophen (NORCO) 7.5-325 MG tablet Take 1 tablet by mouth every 8 (eight) hours as needed.  02/07/15  Yes [provider]  losartan (COZAAR) 50 MG tablet Take 50 mg by mouth daily. 08/12/17  Yes [provider]  meclizine (ANTIVERT) 25 MG tablet Take 25 mg by mouth 3 (three) times daily as needed.    Yes [provider]  Meth-Hyo-M Bl-Benz Acd-Ph Sal 81.6 MG TABS Take 1 tablet (81.6 mg total) by mouth 2 (two) times daily as needed. 06/07/17  Yes Ginnie SmartHatcher, Jeffrey C, MD  methenamine (HIPREX) 1 G tablet Take by mouth as directed. TAKES WHEN NOT TAKING ANTIBIOTICS   Yes [provider]  nebivolol (BYSTOLIC) 10 MG tablet Take 10 mg by mouth every morning.  01/08/14  Yes [provider]  nystatin cream (MYCOSTATIN) APPLY TO AFFECTED AREA TWICE A DAY---  PRN 02/04/15  Yes [provider]  omeprazole (PRILOSEC) 20 MG capsule 1 capsule ONE TO TWO TIMES DAILY 02/08/14  Yes [provider]  ondansetron (ZOFRAN-ODT) 4 MG disintegrating tablet Take 1 tablet by mouth every 8 (eight) hours as needed. 07/01/17  Yes [provider]  pentosan polysulfate (ELMIRON) 100 MG capsule 100 mg. WEEKLY PRN BLADDER INSTILLATION AT DR Patsi [facility]ANNENBAUM OFFICE   Yes [provider]  sucralfate (CARAFATE) 1 G tablet Take 1 g by mouth 2 (two) times daily.    Yes [provider]  triamcinolone cream (KENALOG) 0.1 % BID PRN FOR ECZEMA 02/03/15  Yes [provider]    ROS:  Out of a complete 14 system review of symptoms, the patient complains only of the following symptoms, and all other reviewed systems are negative.  Vertigo  Blood pressure (!) 173/75, pulse (!) 52, height 5\' 4"  (1.626 m), weight 193 lb (87.5  kg).  Physical Exam  General: The patient is alert and cooperative at the time of the examination.  Skin: No significant peripheral edema is noted.   Neurologic Exam  Mental status: The patient is alert and oriented x 3 at the time of the examination. The patient has apparent normal recent and remote memory, with an apparently normal attention span and concentration ability.   Cranial nerves: Facial symmetry is present. Speech is normal, no aphasia or dysarthria is noted. Extraocular movements are full. Visual fields are full.  Motor: The patient has good strength in all 4 extremities.  Sensory examination: Soft touch sensation is symmetric on the face, arms, and legs.  Coordination: The patient has good finger-nose-finger and heel-to-shin bilaterally.  Gait and station: The patient has a normal gait. Tandem gait is normal. Romberg is negative. No drift is seen.  Reflexes: Deep tendon reflexes are symmetric.   MRI brain 10/10/17:  IMPRESSION: This MRI of the brain without contrast shows the following: 1.    Mild generalized cortical atrophy. 2     Minimal chronic microvascular ischemic changes. 3.    There are no acute findings.   * MRI scan images were reviewed online. I agree with the written report.    Assessment/Plan:  1.  Posttraumatic vertigo  2.  Migraine  headache  The patient is doing better but still has some symptoms.  She overall is able to function fairly well, she is able to drive a car.  MRI of the brain was essentially normal, minimal white matter changes were noted.  The patient will follow-up through this office if needed, she may require another session of vestibular rehabilitation if the vertigo worsens in the future.  Marlan Palau MD 02/26/2018 11:06 AM  [facility] [address] [place], [place] [address]  Phone [phone] Fax [phone]

## 2018-03-05 ENCOUNTER — Ambulatory Visit: Payer: Medicare Other | Admitting: Psychiatry

## 2018-03-06 DIAGNOSIS — N3010 Interstitial cystitis (chronic) without hematuria: Secondary | ICD-10-CM | POA: Diagnosis not present

## 2018-03-10 DIAGNOSIS — N3010 Interstitial cystitis (chronic) without hematuria: Secondary | ICD-10-CM | POA: Diagnosis not present

## 2018-03-12 ENCOUNTER — Ambulatory Visit (INDEPENDENT_AMBULATORY_CARE_PROVIDER_SITE_OTHER): Payer: Medicare Other | Admitting: Psychiatry

## 2018-03-12 DIAGNOSIS — F411 Generalized anxiety disorder: Secondary | ICD-10-CM | POA: Diagnosis not present

## 2018-03-12 DIAGNOSIS — F4323 Adjustment disorder with mixed anxiety and depressed mood: Secondary | ICD-10-CM | POA: Diagnosis not present

## 2018-03-12 DIAGNOSIS — M175 Other unilateral secondary osteoarthritis of knee: Secondary | ICD-10-CM | POA: Diagnosis not present

## 2018-03-12 DIAGNOSIS — M797 Fibromyalgia: Secondary | ICD-10-CM | POA: Diagnosis not present

## 2018-03-12 DIAGNOSIS — E559 Vitamin D deficiency, unspecified: Secondary | ICD-10-CM | POA: Diagnosis not present

## 2018-03-12 DIAGNOSIS — Z638 Other specified problems related to primary support group: Secondary | ICD-10-CM

## 2018-03-12 NOTE — Progress Notes (Signed)
Psychotherapy Progress Note -- Marliss CzarAndy Llesenia Fogal, PhD, [facility]  Patient ID: Domenica ReamerBarbara E Kalka     MRN: [id number]     Date: 03/12/2018    Therapy format: Individual psychotherapy Start: 1:18p Stop: 2:20p Time Spent: 62 min Accompanied by: none  Session narrative -- interim history, self-report of stressors and symptoms, applications of prior therapy, status changes, and interventions in session Here for moral support with knee surgery in 1 week and a new round of disappointment and friction with brother and sister-in-law.  Still borrowing friend's car a few days a month, 13 months after hers went down.  Feels acutely missing parents, sense of no family, especially as this will be first surgery w/o mother to greet her on waking.  Card from brother announced move, phone message hinted at news, eventually a Xmas letter noting -- vague about details, and littered with spiritual and impressionistic language.  Earlier this year dealt with him being intrusive and urgent about her working, critiquing her as jealous of him for being married, and physically sick because of sin, crabbing about "having to" send the police to check on her last fall because he obsessed about not hearing from her.  Phone message Monday, offering well wishes for surgery, apparently having hear form family friend.  Noted discomfort in his voice, halting speech, indicates to her ineptitude and sense of obligation.  Insight that Merlyn AlbertFred found BynumLynn after a series of painful rejections by other women, may be fathoming, finally, how his insecurity led him down the path he has taken and made him so susceptible to manipulation and conflict with his own family.  Fibromyalgia and knee pain continue.  Discussed and reommended communication tactics in replying to brother.  Therapeutic modalities: Assertiveness/Communication, Ego-Supportive and Grief Therapy  Mental Status/Observations:  Appearance:   Neat     Behavior:  Appropriate   Motor:  Normal and except for pain-related gait  Speech/Language:   Clear and Coherent  Affect:  Appropriate  Mood:  dysthymic  Thought process:  normal  Thought content:    WNL  Sensory/Perceptual disturbances:    WNL  Orientation:  WNL  Attention:  Good  Concentration:  Good  Memory:  WNL  Insight:    Good  Judgment:   Good  Impulse Control:  Good   Risk Assessment: Danger to Self:  No Self-injurious Behavior: No Danger to Others: No Duty to Warn:no Physical Aggression / Violence:No  Access to Firearms a concern: No   Diagnosis:   ICD-10-CM   1. Generalized anxiety disorder F41.1   2. Relationship problem with family member Z63.8   3. 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 Fibromyalgia M79.7   5. 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 Options to reply to brother . Follow through with surgery and recovery, secure home help ahead of time . Continue to utilize previously learned skills ad lib . Maintain medication, if prescribed, and work faithfully with relevant prescriber(s) . Call the clinic on-call service, present to ER, or call 911 if any life-threatening emergency . Follow up with me when mobile enough to do so  Robley Friesobert Amerigo Mcglory, PhD

## 2018-03-18 DIAGNOSIS — N3010 Interstitial cystitis (chronic) without hematuria: Secondary | ICD-10-CM | POA: Diagnosis not present

## 2018-03-19 ENCOUNTER — Telehealth: Payer: Self-pay | Admitting: Behavioral Health

## 2018-03-19 ENCOUNTER — Other Ambulatory Visit: Payer: Self-pay | Admitting: Behavioral Health

## 2018-03-19 DIAGNOSIS — N3090 Cystitis, unspecified without hematuria: Secondary | ICD-10-CM

## 2018-03-19 MED ORDER — FLUCONAZOLE 100 MG PO TABS: 100.0000 mg | ORAL_TABLET | Freq: Every day | ORAL | 0 refills | Status: DC

## 2018-03-19 MED ORDER — AMOXICILLIN-POT CLAVULANATE 875-125 MG PO TABS: 1.0000 | ORAL_TABLET | Freq: Two times a day (BID) | ORAL | 0 refills | Status: AC

## 2018-03-19 NOTE — Telephone Encounter (Signed)
Patient called stating she was scheduled to have knee surgery today 03/19/2018 however it is now rescheduled for 04/09/2018.  Patient started taking Augmentin per Dr. Ninetta LightsHatcher.  But did not complete the 7 days.  She has 2 questions 1. Does she need a new rx for the Augmentin for the surgery scheduled for 04/09/2018? 2.She took 4 days worth of Augmentin this past week but when they decided to reschedule the surgery she stopped but then forgot she should complete antibiotics.  She got in about 4 days worth stopped for two days and wants to know should she take the remaining 5 doses.

## 2018-03-19 NOTE — Telephone Encounter (Signed)
She can stop the augmentin now Resume full week of augmentin prior to surgery prior to 04-09-17 (start augmentin on 04-02-18) thanks

## 2018-03-19 NOTE — Telephone Encounter (Signed)
Called Ms. Steinberg and left her a voicemail with antibiotic instructions per her request.  Informed her to stop Augmentin now and resume 04/02/2018 one week before her next knee surgery.  Informed her to call the office back with any questions or concerns. Angeline SlimAshley Estefana Taylor RN

## 2018-03-20 ENCOUNTER — Ambulatory Visit: Payer: Medicare Other | Admitting: Infectious Diseases

## 2018-04-08 ENCOUNTER — Telehealth: Payer: Self-pay | Admitting: Behavioral Health

## 2018-04-08 NOTE — Telephone Encounter (Signed)
Patient called in today stating she has knee surgery tomorrow.  She started taking Augmentin 04/02/2018 as prescribed by Dr. Ninetta Lights she states she took it twice a day for two days them only took it once a day for three days due to GI upset.  She then states yesterday she went back to taking it twice a day for two days and wants to know if Dr.Hathcer is ok with this change.  She also states the pre-op nurse did not want her to take the antibiotic tomorrow before surgery.  She also wants to know should she finish the Augmentin as prescribed after the surgery is complete. Angeline Slim RN

## 2018-04-08 NOTE — Telephone Encounter (Signed)
Called Jennifer [name], left a voicemail at her request that per Dr. Ninetta Lights she can complete the Augmentin after surgery.  Left call back number if she had any questions. Angeline Slim RN

## 2018-04-08 NOTE — Telephone Encounter (Signed)
Ok to complete after surgery.  thanks

## 2018-04-09 DIAGNOSIS — S83241A Other tear of medial meniscus, current injury, right knee, initial encounter: Secondary | ICD-10-CM | POA: Diagnosis not present

## 2018-04-09 DIAGNOSIS — M1711 Unilateral primary osteoarthritis, right knee: Secondary | ICD-10-CM | POA: Diagnosis not present

## 2018-04-09 DIAGNOSIS — M94261 Chondromalacia, right knee: Secondary | ICD-10-CM | POA: Diagnosis not present

## 2018-04-09 DIAGNOSIS — X58XXXA Exposure to other specified factors, initial encounter: Secondary | ICD-10-CM | POA: Diagnosis not present

## 2018-04-09 DIAGNOSIS — Y999 Unspecified external cause status: Secondary | ICD-10-CM | POA: Diagnosis not present

## 2018-04-09 DIAGNOSIS — G8918 Other acute postprocedural pain: Secondary | ICD-10-CM | POA: Diagnosis not present

## 2018-04-14 ENCOUNTER — Telehealth: Payer: Self-pay | Admitting: Psychiatry

## 2018-04-14 NOTE — Telephone Encounter (Signed)
Mrs Jennifer [name] called to cancel her appt for tomorrow. She wanted you to know that she was doing well but still not that mobile. She will call back when she feels like she can get here. Keep her in your prayers.

## 2018-04-15 ENCOUNTER — Ambulatory Visit: Payer: Medicare Other | Admitting: Psychiatry

## 2018-04-22 DIAGNOSIS — N3010 Interstitial cystitis (chronic) without hematuria: Secondary | ICD-10-CM | POA: Diagnosis not present

## 2018-04-25 DIAGNOSIS — N3010 Interstitial cystitis (chronic) without hematuria: Secondary | ICD-10-CM | POA: Diagnosis not present

## 2018-05-02 DIAGNOSIS — N3010 Interstitial cystitis (chronic) without hematuria: Secondary | ICD-10-CM | POA: Diagnosis not present

## 2018-05-09 ENCOUNTER — Ambulatory Visit: Payer: Medicare Other | Admitting: Psychiatry

## 2018-05-09 DIAGNOSIS — N3010 Interstitial cystitis (chronic) without hematuria: Secondary | ICD-10-CM | POA: Diagnosis not present

## 2018-05-20 DIAGNOSIS — Z4789 Encounter for other orthopedic aftercare: Secondary | ICD-10-CM | POA: Diagnosis not present

## 2018-05-20 DIAGNOSIS — M1711 Unilateral primary osteoarthritis, right knee: Secondary | ICD-10-CM | POA: Diagnosis not present

## 2018-05-20 DIAGNOSIS — Z5189 Encounter for other specified aftercare: Secondary | ICD-10-CM | POA: Diagnosis not present

## 2018-05-27 DIAGNOSIS — M25561 Pain in right knee: Secondary | ICD-10-CM | POA: Diagnosis not present

## 2018-05-27 DIAGNOSIS — M48062 Spinal stenosis, lumbar region with neurogenic claudication: Secondary | ICD-10-CM | POA: Diagnosis not present

## 2018-05-27 DIAGNOSIS — M25562 Pain in left knee: Secondary | ICD-10-CM | POA: Diagnosis not present

## 2018-06-03 DIAGNOSIS — M25561 Pain in right knee: Secondary | ICD-10-CM | POA: Diagnosis not present

## 2018-06-06 DIAGNOSIS — N3010 Interstitial cystitis (chronic) without hematuria: Secondary | ICD-10-CM | POA: Diagnosis not present

## 2018-06-10 ENCOUNTER — Ambulatory Visit: Payer: Medicare Other | Admitting: Psychiatry

## 2018-06-12 DIAGNOSIS — M25561 Pain in right knee: Secondary | ICD-10-CM | POA: Diagnosis not present

## 2018-06-13 DIAGNOSIS — N3010 Interstitial cystitis (chronic) without hematuria: Secondary | ICD-10-CM | POA: Diagnosis not present

## 2018-06-19 DIAGNOSIS — I10 Essential (primary) hypertension: Secondary | ICD-10-CM | POA: Diagnosis not present

## 2018-06-19 DIAGNOSIS — R42 Dizziness and giddiness: Secondary | ICD-10-CM | POA: Diagnosis not present

## 2018-06-19 DIAGNOSIS — N390 Urinary tract infection, site not specified: Secondary | ICD-10-CM | POA: Diagnosis not present

## 2018-06-19 DIAGNOSIS — E039 Hypothyroidism, unspecified: Secondary | ICD-10-CM | POA: Diagnosis not present

## 2018-06-19 DIAGNOSIS — K219 Gastro-esophageal reflux disease without esophagitis: Secondary | ICD-10-CM | POA: Diagnosis not present

## 2018-06-19 DIAGNOSIS — R252 Cramp and spasm: Secondary | ICD-10-CM | POA: Diagnosis not present

## 2018-06-24 ENCOUNTER — Ambulatory Visit: Payer: Medicare Other | Admitting: Psychiatry

## 2018-06-30 DIAGNOSIS — R3989 Other symptoms and signs involving the genitourinary system: Secondary | ICD-10-CM | POA: Diagnosis not present

## 2018-07-01 ENCOUNTER — Ambulatory Visit: Payer: Medicare Other | Admitting: Psychiatry

## 2018-07-03 ENCOUNTER — Ambulatory Visit: Payer: Medicare Other | Admitting: Psychiatry

## 2018-07-31 DIAGNOSIS — J300 Vasomotor rhinitis: Secondary | ICD-10-CM | POA: Diagnosis not present

## 2018-07-31 DIAGNOSIS — J310 Chronic rhinitis: Secondary | ICD-10-CM | POA: Diagnosis not present

## 2018-07-31 DIAGNOSIS — J329 Chronic sinusitis, unspecified: Secondary | ICD-10-CM | POA: Diagnosis not present

## 2018-08-04 DIAGNOSIS — M25561 Pain in right knee: Secondary | ICD-10-CM | POA: Diagnosis not present

## 2018-08-04 DIAGNOSIS — M25562 Pain in left knee: Secondary | ICD-10-CM | POA: Diagnosis not present

## 2018-08-04 DIAGNOSIS — M47816 Spondylosis without myelopathy or radiculopathy, lumbar region: Secondary | ICD-10-CM | POA: Diagnosis not present

## 2018-08-12 DIAGNOSIS — Z4789 Encounter for other orthopedic aftercare: Secondary | ICD-10-CM | POA: Insufficient documentation

## 2018-08-19 ENCOUNTER — Ambulatory Visit: Payer: Medicare Other | Admitting: Infectious Diseases

## 2018-08-21 DIAGNOSIS — M25561 Pain in right knee: Secondary | ICD-10-CM | POA: Diagnosis not present

## 2018-08-21 DIAGNOSIS — M1711 Unilateral primary osteoarthritis, right knee: Secondary | ICD-10-CM | POA: Diagnosis not present

## 2018-08-21 DIAGNOSIS — M179 Osteoarthritis of knee, unspecified: Secondary | ICD-10-CM | POA: Diagnosis not present

## 2018-09-01 DIAGNOSIS — M25561 Pain in right knee: Secondary | ICD-10-CM | POA: Diagnosis not present

## 2018-09-01 DIAGNOSIS — M1711 Unilateral primary osteoarthritis, right knee: Secondary | ICD-10-CM | POA: Diagnosis not present

## 2018-09-08 DIAGNOSIS — M25561 Pain in right knee: Secondary | ICD-10-CM | POA: Diagnosis not present

## 2018-09-08 DIAGNOSIS — M1711 Unilateral primary osteoarthritis, right knee: Secondary | ICD-10-CM | POA: Diagnosis not present

## 2018-09-23 ENCOUNTER — Ambulatory Visit: Payer: Medicare Other | Admitting: Infectious Diseases

## 2018-10-01 DIAGNOSIS — M48062 Spinal stenosis, lumbar region with neurogenic claudication: Secondary | ICD-10-CM | POA: Diagnosis not present

## 2018-10-01 DIAGNOSIS — M25562 Pain in left knee: Secondary | ICD-10-CM | POA: Diagnosis not present

## 2018-10-01 DIAGNOSIS — M25561 Pain in right knee: Secondary | ICD-10-CM | POA: Diagnosis not present

## 2018-10-02 DIAGNOSIS — M25561 Pain in right knee: Secondary | ICD-10-CM | POA: Diagnosis not present

## 2018-10-07 ENCOUNTER — Ambulatory Visit: Payer: Medicare Other | Admitting: Infectious Diseases

## 2018-10-28 DIAGNOSIS — N3010 Interstitial cystitis (chronic) without hematuria: Secondary | ICD-10-CM | POA: Diagnosis not present

## 2018-11-04 NOTE — Progress Notes (Deleted)
PATIENT: Jennifer [name] DOB: [birth year]  REASON FOR VISIT: follow up HISTORY FROM: patient  HISTORY OF PRESENT ILLNESS: Today 11/04/18  Jennifer [name] is a 73 year old female with history of a fall with a bump to the head in March 2017.  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  She is able to activate the vertigo by rolling onto her left side.  She has completed vestibular rehab with good benefit.  She still may have to take a meclizine on occasion for vertigo.  HISTORY 02/26/2018 Dr. Anne HahnWillis: Jennifer [name] is a 73 year old right-handed white female with a history of a fall with a bump to the head in March 2017.  The patient has had posttraumatic vertigo since that time, she clearly has a positional nature to the vertigo, when she lies down in bed she may get a 10-second episode of vertigo, she can activate the vertigo by rolling to the left side.  When she is gets up out of bed in the morning, she has to sit for a few moments to allow the vertigo to go away.  The patient has not had any falls, she does not have nausea or vomiting.  She has undergone vestibular rehabilitation with good benefit, but the patient will have to take meclizine on occasion for the vertigo.  She has not kept up with her vestibular exercises.  She has had gradual improvement in her headaches.  She has a mild left-sided headache on average twice a month, she does not have to take medication for this.  She returns to this office for an evaluation  REVIEW OF SYSTEMS: Out of a complete 14 system review of symptoms, the patient complains only of the following symptoms, and all other reviewed systems are negative.  ALLERGIES: Allergies  Allergen Reactions  . Avelox [Moxifloxacin] Hives, Shortness Of Breath and Swelling  . Quinolones Anaphylaxis  . Sulfa Antibiotics Hives  . Asa [Aspirin] Diarrhea and Other (See Comments)    GI upset  . Dmso [Dimethyl Sulfoxide] Itching  . Ibuprofen Other (See Comments)    GI  upset  . Nitrofurantoin Diarrhea    Gi upset  . Adhesive [Tape] Rash  . Codeine Hives  . Macrodantin [Nitrofurantoin Macrocrystal] Nausea Only and Rash    Abdominal and chest pain. Itching.  . Yellow Dye Rash    HOME MEDICATIONS: Outpatient Medications Prior to Visit  Medication Sig Dispense Refill  . DENTA 5000 PLUS 1.1 % CREA dental cream     . dextromethorphan-guaiFENesin (MUCINEX DM) 30-600 MG 12hr tablet Take 1 tablet by mouth 2 (two) times daily as needed for cough.    . fexofenadine (ALLEGRA) 60 MG tablet Take 60 mg by mouth daily. Reported on 05/03/2015    . fluconazole (DIFLUCAN) 100 MG tablet Take 1 tablet (100 mg total) by mouth daily. 7 tablet 0  . HYDROcodone-acetaminophen (NORCO) 7.5-325 MG tablet Take 1 tablet by mouth every 8 (eight) hours as needed.     Marland Kitchen. losartan (COZAAR) 50 MG tablet Take 100 mg by mouth daily.   0  . meclizine (ANTIVERT) 25 MG tablet Take 25 mg by mouth 3 (three) times daily as needed.     . Meth-Hyo-M Bl-Benz Acd-Ph Sal 81.6 MG TABS Take 1 tablet (81.6 mg total) by mouth 2 (two) times daily as needed. 30 each 1  . methenamine (HIPREX) 1 G tablet Take by mouth as directed. TAKES WHEN NOT TAKING ANTIBIOTICS    . nebivolol (BYSTOLIC) 10 MG tablet Take  5 mg by mouth every morning.     . nystatin cream (MYCOSTATIN) APPLY TO AFFECTED AREA TWICE A DAY---  PRN  0  . omeprazole (PRILOSEC) 20 MG capsule 1 capsule ONE TO TWO TIMES DAILY    . ondansetron (ZOFRAN-ODT) 4 MG disintegrating tablet Take 1 tablet by mouth every 8 (eight) hours as needed.  1  . pentosan polysulfate (ELMIRON) 100 MG capsule 100 mg. WEEKLY PRN BLADDER INSTILLATION AT DR Patsi SearsANNENBAUM OFFICE    . sucralfate (CARAFATE) 1 G tablet Take 1 g by mouth 2 (two) times daily.     Marland Kitchen. triamcinolone cream (KENALOG) 0.1 % BID PRN FOR ECZEMA  0   No facility-administered medications prior to visit.     PAST MEDICAL HISTORY: Past Medical History:  Diagnosis Date  . Acute meniscal tear of left knee     FOLLOWED BY DR GIAFFREY  . BPPV (benign paroxysmal positional vertigo)   . Chronic bladder pain   . Chronic fatigue syndrome   . Chronic low back pain    W/ RIGHT LEG PAIN AND NUMBNESS  . Cystitis, chronic   . Fibromyalgia   . GERD (gastroesophageal reflux disease)   . Hiatal hernia   . Hypertension   . IC (interstitial cystitis)   . OSA (obstructive sleep apnea)    per pt study yrs ago-- moderate osa ,  intolerant cpap  . Pinched vertebral nerve    bilateral L2 -- L3 and L3 -- L4-/  epidural injection's treatment , PT and pain clince  . PONV (postoperative nausea and vomiting)    severe  . 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 Self-catheterizes urinary bladder   . Spinal stenosis, lumbar region with neurogenic claudication   . Wears glasses   . Wears partial dentures    upper and lower    PAST SURGICAL HISTORY: Past Surgical History:  Procedure Laterality Date  . ABDOMINAL HYSTERECTOMY  1980   w/ Left Salpingoophorectomy  . CARDIAC CATHETERIZATION  08-21-2001  dr Verdis Primehenry smith   normal coronary arteries and LVF  . CARPAL TUNNEL RELEASE Bilateral 1986  . [facility]LASTY/  [facility]  [facility]  . CHOLECYSTECTOMY  1992  . CYSTO WITH HYDRODISTENSION N/A 01/12/2016   Procedure: CYSTOSCOPY/HYDRODISTENSION;  Surgeon: Jethro BolusSigmund Tannenbaum, MD;  [facility] [employer] [facility];  Service: Urology;  Laterality: N/A;  . CYSTO WITH HYDRODISTENSION N/A 08/30/2016   Procedure: CYSTOSCOPY/HYDRODISTENSION OF BLADDER INJECTION OF MARCAINE/PYRIDIUM;  Surgeon: Jethro Bolusannenbaum, Sigmund, MD;  [facility] [place];  Service: Urology;  Laterality: N/A;  . MULTIPLE CYSTO/  HYDRODISTENTION/  INSTILLATION THERAPY  last one 08-19-2009  . NASAL SINUS SURGERY  1987  . TONSILLECTOMY  as child  . VAGINAL GROWTH REMOVED  as teen    FAMILY HISTORY: Family History  Problem Relation Age of Onset  . CAD Father   . Asthma Father   .  Hypertension Father   . Alzheimer's disease Father   . Emphysema Father   . COPD Father   . Heart failure Mother   . Peripheral vascular disease Mother   . Stroke Mother   . Hypertension Mother   . Colonic polyp Mother   . Cancer Brother        melanoma    SOCIAL HISTORY: Social History   Socioeconomic History  . Marital status: Single    Spouse name: Not on file  . Number of children: 0  .  Years of education: College  . Highest education level: Not on file  Occupational History  . Occupation: n/a  Social Needs  . Financial resource strain: Not on file  . Food insecurity    Worry: Not on file    Inability: Not on file  . Transportation needs    Medical: Not on file    Non-medical: Not on file  Tobacco Use  . Smoking status: Never Smoker  . Smokeless tobacco: Never Used  Substance and Sexual Activity  . Alcohol use: No    Alcohol/week: 0.0 standard drinks  . Drug use: No  . Sexual activity: Not on file  Lifestyle  . Physical activity    Days per week: Not on file    Minutes per session: Not on file  . Stress: Not on file  Relationships  . Social Musicianconnections    Talks on phone: Not on file    Gets together: Not on file    Attends religious service: Not on file    Active member of club or organization: Not on file    Attends meetings of clubs or organizations: Not on file    Relationship status: Not on file  . Intimate partner violence    Fear of current or ex partner: Not on file    Emotionally abused: Not on file    Physically abused: Not on file    Forced sexual activity: Not on file  Other Topics Concern  . Not on file  Social History Narrative   Lives alone   Caffeine use: 2 glasses tea/day      PHYSICAL EXAM  There were no vitals filed for this visit. There is no height or weight on file to calculate BMI.  Generalized: Well developed, in no acute distress   Neurological examination  Mentation: Alert oriented to time, place, history taking.  Follows all commands speech and language fluent Cranial nerve II-XII: Pupils were equal round reactive to light. Extraocular movements were full, visual field were full on confrontational test. Facial sensation and strength were normal. Uvula tongue midline. Head turning and shoulder shrug  were normal and symmetric. Motor: The motor testing reveals 5 over 5 strength of all 4 extremities. Good symmetric motor tone is noted throughout.  Sensory: Sensory testing is intact to soft touch on all 4 extremities. No evidence of extinction is noted.  Coordination: Cerebellar testing reveals good finger-nose-finger and heel-to-shin bilaterally.  Gait and station: Gait is normal. Tandem gait is normal. Romberg is negative. No drift is seen.  Reflexes: Deep tendon reflexes are symmetric and normal bilaterally.   DIAGNOSTIC DATA (LABS, IMAGING, TESTING) - I reviewed patient records, labs, notes, testing and imaging myself where available.  Lab Results  Component Value Date   WBC 9.3 02/15/2017   HGB 13.1 02/15/2017   HCT 40.0 02/15/2017   MCV 84.6 02/15/2017   PLT 290 02/15/2017      Component Value Date/Time   NA 137 02/15/2017 1523   K 4.2 02/15/2017 1523   CL 105 02/15/2017 1523   CO2 23 02/15/2017 1523   GLUCOSE 105 (H) 02/15/2017 1523   BUN 18 02/15/2017 1523   CREATININE 1.12 (H) 02/15/2017 1523   CALCIUM 9.3 02/15/2017 1523   PROT 8.1 02/15/2017 1523   ALBUMIN 4.5 02/15/2017 1523   AST 25 02/15/2017 1523   ALT 18 02/15/2017 1523   ALKPHOS 86 02/15/2017 1523   BILITOT 0.7 02/15/2017 1523   GFRNONAA 49 (L) 02/15/2017 1523   GFRAA 57 (  L) 02/15/2017 1523   No results found for: CHOL, HDL, LDLCALC, LDLDIRECT, TRIG, CHOLHDL No results found for: HGBA1C No results found for: VITAMINB12 No results found for: TSH    ASSESSMENT AND PLAN 73 y.o. year old female  has a past medical history of Acute meniscal tear of left knee, BPPV (benign paroxysmal positional vertigo), Chronic bladder  pain, Chronic fatigue syndrome, Chronic low back pain, Cystitis, chronic, Fibromyalgia, GERD (gastroesophageal reflux disease), Hiatal hernia, Hypertension, IC (interstitial cystitis), OSA (obstructive sleep apnea), Pinched vertebral nerve, PONV (postoperative nausea and vomiting), S/P urinary bladder replacement, Self-catheterizes urinary bladder, Spinal stenosis, lumbar region with neurogenic claudication, Wears glasses, and Wears partial dentures. here with ***   I spent 15 minutes with the patient. 50% of this time was spent   Butler Denmark, [place], DNP 11/04/2018, 8:58 PM [facility] [address] [place], [place] [address] [phone]

## 2018-11-05 ENCOUNTER — Encounter: Payer: Self-pay | Admitting: Neurology

## 2018-11-05 ENCOUNTER — Ambulatory Visit: Payer: Self-pay | Admitting: Neurology

## 2018-11-07 DIAGNOSIS — R05 Cough: Secondary | ICD-10-CM | POA: Diagnosis not present

## 2018-11-11 DIAGNOSIS — R05 Cough: Secondary | ICD-10-CM | POA: Diagnosis not present

## 2018-11-18 ENCOUNTER — Ambulatory Visit: Payer: Self-pay | Admitting: Neurology

## 2018-11-20 ENCOUNTER — Ambulatory Visit: Payer: Medicare Other | Admitting: Infectious Diseases

## 2018-11-27 ENCOUNTER — Telehealth: Payer: Self-pay | Admitting: Neurology

## 2018-11-27 NOTE — Telephone Encounter (Signed)
Called pt to inform her NP sarah will be out of office due to family emergency next week lvm to inform pt the appt will be c/a and to call back to r/s  °

## 2018-11-28 DIAGNOSIS — J300 Vasomotor rhinitis: Secondary | ICD-10-CM | POA: Diagnosis not present

## 2018-12-02 ENCOUNTER — Encounter

## 2018-12-02 ENCOUNTER — Ambulatory Visit: Payer: Self-pay | Admitting: Neurology

## 2018-12-11 ENCOUNTER — Ambulatory Visit: Payer: Medicare Other | Admitting: Infectious Diseases

## 2018-12-16 ENCOUNTER — Ambulatory Visit: Payer: Medicare Other | Admitting: Infectious Diseases

## 2018-12-16 DIAGNOSIS — J343 Hypertrophy of nasal turbinates: Secondary | ICD-10-CM | POA: Diagnosis not present

## 2018-12-16 DIAGNOSIS — H838X3 Other specified diseases of inner ear, bilateral: Secondary | ICD-10-CM | POA: Diagnosis not present

## 2018-12-16 DIAGNOSIS — R42 Dizziness and giddiness: Secondary | ICD-10-CM | POA: Diagnosis not present

## 2018-12-16 DIAGNOSIS — H903 Sensorineural hearing loss, bilateral: Secondary | ICD-10-CM | POA: Diagnosis not present

## 2018-12-16 DIAGNOSIS — J310 Chronic rhinitis: Secondary | ICD-10-CM | POA: Diagnosis not present

## 2018-12-18 ENCOUNTER — Ambulatory Visit: Payer: Self-pay | Admitting: Family Medicine

## 2018-12-29 DIAGNOSIS — G894 Chronic pain syndrome: Secondary | ICD-10-CM | POA: Diagnosis not present

## 2018-12-29 DIAGNOSIS — I700 Atherosclerosis of aorta: Secondary | ICD-10-CM | POA: Diagnosis not present

## 2018-12-29 DIAGNOSIS — M5136 Other intervertebral disc degeneration, lumbar region: Secondary | ICD-10-CM | POA: Diagnosis not present

## 2018-12-29 DIAGNOSIS — M4186 Other forms of scoliosis, lumbar region: Secondary | ICD-10-CM | POA: Diagnosis not present

## 2018-12-29 DIAGNOSIS — M47816 Spondylosis without myelopathy or radiculopathy, lumbar region: Secondary | ICD-10-CM | POA: Diagnosis not present

## 2018-12-30 ENCOUNTER — Ambulatory Visit (INDEPENDENT_AMBULATORY_CARE_PROVIDER_SITE_OTHER): Payer: Medicare Other | Admitting: Infectious Diseases

## 2018-12-30 ENCOUNTER — Encounter: Payer: Self-pay | Admitting: Infectious Diseases

## 2018-12-30 ENCOUNTER — Other Ambulatory Visit: Payer: Self-pay

## 2018-12-30 DIAGNOSIS — Z23 Encounter for immunization: Secondary | ICD-10-CM

## 2018-12-30 DIAGNOSIS — M797 Fibromyalgia: Secondary | ICD-10-CM

## 2018-12-30 DIAGNOSIS — R42 Dizziness and giddiness: Secondary | ICD-10-CM

## 2018-12-30 DIAGNOSIS — N3090 Cystitis, unspecified without hematuria: Secondary | ICD-10-CM | POA: Diagnosis not present

## 2018-12-30 MED ORDER — AMOXICILLIN-POT CLAVULANATE 875-125 MG PO TABS: 1.0000 | ORAL_TABLET | Freq: Two times a day (BID) | ORAL | 1 refills | Status: DC

## 2018-12-30 NOTE — Assessment & Plan Note (Signed)
Has f/u with ENT

## 2018-12-30 NOTE — Assessment & Plan Note (Signed)
Has f/u with pain clinic in [facility].

## 2018-12-30 NOTE — Assessment & Plan Note (Signed)
Took 1 dose of anbx on fri and 1 on sat.  Will give her a complete course, for 7 days.  Explained increased risk of resistance with intermittent dosing.  Take imodium prn.  rtc prn

## 2018-12-30 NOTE — Progress Notes (Signed)
Subjective:    Patient ID: Jennifer [name], female    DOB: 12-17-1945, 73 y.o.   MRN: [id number]  HPI 73yo F with hxHTN,fibromyalgia, gerd, possible IBS, [facility] She had 12 months of bladder infection afterwards. 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 She self cath's and has had difficulty keeping with boiling her cath's.  Has had multiple UTI with E coli as well as P mirabilis. She was found on CT2016to have small kidney stone.   Previouslyhas been on macrobid, kelfex (abd rash/fungus), IV gent (01-2015), augmentin. As well ascranberry extract.  UCx (03-14-15) E coli- ESBL, (S- augmentin, zosyn, imipenem, gent/tobra, nitro).  She was seen in [place] 03-2015 and was given prn rx for augmentin as well as education on UTI and colonization.  UCx 11-7 E coli (> 100k) S- unasyn/augmentin, amikacin/gent, Ceftaz/Cefepime/cefoxitin, imi/mero/ertapenem, bactrim, zosyn, nitro. UCx11-2018 for UTI. UA (-) for LE and Nitr. + bacteria. She was given fosfomycin.    Her cx grew E coli- ESBL.  s- nitro, augmentin, gent, imipenem.     UCx 05-2017 E coli (same sensi).  Shecontinues to get bladder PT at [facility].   Her course has also been complicated by homelessness. She now has her own apt.   She had R knee surgery for a torn meniscus, ganglion cyst 04-2018. Has had persistent pain. Injections, PT. Had minimal relief with injections. Next 03-2019.   Her vertigo, dizziness have persisted. She has f/u with ENT. Has had 1 sinus infection, rx with augmentin. She has seen new ENT and is sched to be seen in their vertigo testing next 2 weeks.   Bladder has been doing ok. She was getting regular instillations until COVID. Feels like her bladder is flaring up last week. Burining, pain. Tried uragesic which gave her some relief. Over last weekend was urinating q1-2 hrs. She had old rx for her ENT of augmentin which  she started. Still has some frequency, pain.  Has given her mild GI upset.   Continues to see pain mgmt in [employer]. Kathe Mariner). She may get nerve ablation.   Still living on 3rd floor apt. Irritates her knee.   Review of Systems  Constitutional: Negative for chills and fever.  Genitourinary: Positive for difficulty urinating, dysuria and frequency.  Musculoskeletal: Positive for arthralgias and back pain.  Please see HPI. All other systems reviewed and negative.     Objective:   Physical Exam Constitutional:      Appearance: Normal appearance.  HENT:     Mouth/Throat:     Mouth: Mucous membranes are moist.     Pharynx: No oropharyngeal exudate.  Eyes:     Extraocular Movements: Extraocular movements intact.     Pupils: Pupils are equal, round, and reactive to light.  Cardiovascular:     Rate and Rhythm: Normal rate and regular rhythm.  Pulmonary:     Effort: Pulmonary effort is normal.     Breath sounds: Normal breath sounds.  Abdominal:     General: Bowel sounds are normal. There is no distension.     Palpations: Abdomen is soft.     Tenderness: There is no abdominal tenderness. There is no right CVA tenderness or left CVA tenderness.  Musculoskeletal:     Right lower leg: No edema.     Left lower leg: No edema.  Neurological:     Mental Status: She is alert.  Psychiatric:  Mood and Affect: Mood normal.        Thought Content: Thought content normal.       Assessment & Plan:

## 2018-12-31 ENCOUNTER — Telehealth: Payer: Self-pay

## 2018-12-31 NOTE — Telephone Encounter (Signed)
Received voicemail in triage from patient stating she saw Dr. Johnnye Sima yesterday and briefly discussed the need for Rx for yeast infection while on antibiotics. Patient requesting something for yeast to be sent to [id number] - , [place] to provider for advise.  Jennifer [name]

## 2019-01-01 ENCOUNTER — Other Ambulatory Visit: Payer: Self-pay | Admitting: Infectious Diseases

## 2019-01-01 DIAGNOSIS — N3090 Cystitis, unspecified without hematuria: Secondary | ICD-10-CM

## 2019-01-01 LAB — URINALYSIS, ROUTINE W REFLEX MICROSCOPIC
Bacteria, UA: NONE SEEN /HPF
Bilirubin Urine: NEGATIVE
Glucose, UA: NEGATIVE
Hgb urine dipstick: NEGATIVE
Hyaline Cast: NONE SEEN /LPF
Ketones, ur: NEGATIVE
Nitrite: NEGATIVE
Protein, ur: NEGATIVE
Specific Gravity, Urine: 1.012 (ref 1.001–1.03)
pH: <=5 (ref 5.0–8.0)

## 2019-01-01 LAB — URINE CULTURE
MICRO NUMBER:: 935660
SPECIMEN QUALITY:: ADEQUATE

## 2019-01-01 MED ORDER — FLUCONAZOLE 100 MG PO TABS: 100.0000 mg | ORAL_TABLET | Freq: Every day | ORAL | 3 refills | Status: DC

## 2019-01-01 MED ORDER — FLUCONAZOLE 100 MG PO TABS
100.0000 mg | ORAL_TABLET | Freq: Every day | ORAL | 3 refills | Status: AC
Start: 2019-01-01 — End: 2019-01-08

## 2019-01-01 NOTE — Telephone Encounter (Signed)
Patient called and made aware of new medication sent to pharmacy. Jennifer [name]

## 2019-01-01 NOTE — Telephone Encounter (Signed)
flucon refilled.  I am not sure it went to her pharmacy electronically, Can you follow up on this? thanks

## 2019-01-01 NOTE — Addendum Note (Signed)
Addended by: Eugenia Mcalpine on: 01/01/2019 10:07 AM   Modules accepted: Orders

## 2019-01-01 NOTE — Progress Notes (Signed)
flucon for candidiasis while on anbx for UTI

## 2019-01-05 ENCOUNTER — Telehealth: Payer: Self-pay | Admitting: *Deleted

## 2019-01-05 NOTE — Telephone Encounter (Addendum)
Relayed to patient. She stated it is helping, and she takes it with food to help with any stomach upset. She will complete the antibiotics as directed. Landis Gandy, RN   ----- Message from Campbell Riches, MD sent at 01/05/2019  7:38 AM EDT ----- Can you let Ms Woldt know her urine grew E colli that is sensitive to the antibiotics she is on? Thanks jeff ----- Message ----- From: Cheyenne Adas Lab Results In Sent: 12/31/2018   5:35 AM EDT To: Campbell Riches, MD

## 2019-01-16 DIAGNOSIS — R42 Dizziness and giddiness: Secondary | ICD-10-CM | POA: Diagnosis not present

## 2019-01-16 DIAGNOSIS — H903 Sensorineural hearing loss, bilateral: Secondary | ICD-10-CM | POA: Diagnosis not present

## 2019-01-20 DIAGNOSIS — N3010 Interstitial cystitis (chronic) without hematuria: Secondary | ICD-10-CM | POA: Diagnosis not present

## 2019-01-20 DIAGNOSIS — Z01812 Encounter for preprocedural laboratory examination: Secondary | ICD-10-CM | POA: Diagnosis not present

## 2019-01-20 DIAGNOSIS — Z20828 Contact with and (suspected) exposure to other viral communicable diseases: Secondary | ICD-10-CM | POA: Diagnosis not present

## 2019-01-28 DIAGNOSIS — R0982 Postnasal drip: Secondary | ICD-10-CM | POA: Diagnosis not present

## 2019-01-28 DIAGNOSIS — J343 Hypertrophy of nasal turbinates: Secondary | ICD-10-CM | POA: Diagnosis not present

## 2019-01-28 DIAGNOSIS — J310 Chronic rhinitis: Secondary | ICD-10-CM | POA: Diagnosis not present

## 2019-01-28 DIAGNOSIS — R42 Dizziness and giddiness: Secondary | ICD-10-CM | POA: Diagnosis not present

## 2019-02-04 DIAGNOSIS — I10 Essential (primary) hypertension: Secondary | ICD-10-CM | POA: Diagnosis not present

## 2019-02-04 DIAGNOSIS — E559 Vitamin D deficiency, unspecified: Secondary | ICD-10-CM | POA: Diagnosis not present

## 2019-02-04 DIAGNOSIS — Z1239 Encounter for other screening for malignant neoplasm of breast: Secondary | ICD-10-CM | POA: Diagnosis not present

## 2019-02-04 DIAGNOSIS — N390 Urinary tract infection, site not specified: Secondary | ICD-10-CM | POA: Diagnosis not present

## 2019-02-04 DIAGNOSIS — R946 Abnormal results of thyroid function studies: Secondary | ICD-10-CM | POA: Diagnosis not present

## 2019-02-04 DIAGNOSIS — E7800 Pure hypercholesterolemia, unspecified: Secondary | ICD-10-CM | POA: Diagnosis not present

## 2019-02-04 DIAGNOSIS — Z1211 Encounter for screening for malignant neoplasm of colon: Secondary | ICD-10-CM | POA: Diagnosis not present

## 2019-02-04 DIAGNOSIS — G4730 Sleep apnea, unspecified: Secondary | ICD-10-CM | POA: Diagnosis not present

## 2019-02-04 DIAGNOSIS — R5383 Other fatigue: Secondary | ICD-10-CM | POA: Diagnosis not present

## 2019-02-04 DIAGNOSIS — R42 Dizziness and giddiness: Secondary | ICD-10-CM | POA: Diagnosis not present

## 2019-02-06 DIAGNOSIS — R42 Dizziness and giddiness: Secondary | ICD-10-CM | POA: Diagnosis not present

## 2019-02-11 DIAGNOSIS — R42 Dizziness and giddiness: Secondary | ICD-10-CM | POA: Diagnosis not present

## 2019-02-18 DIAGNOSIS — I10 Essential (primary) hypertension: Secondary | ICD-10-CM | POA: Diagnosis not present

## 2019-02-18 DIAGNOSIS — E7800 Pure hypercholesterolemia, unspecified: Secondary | ICD-10-CM | POA: Diagnosis not present

## 2019-02-18 DIAGNOSIS — E039 Hypothyroidism, unspecified: Secondary | ICD-10-CM | POA: Diagnosis not present

## 2019-02-23 DIAGNOSIS — M48062 Spinal stenosis, lumbar region with neurogenic claudication: Secondary | ICD-10-CM | POA: Diagnosis not present

## 2019-02-23 DIAGNOSIS — G894 Chronic pain syndrome: Secondary | ICD-10-CM | POA: Diagnosis not present

## 2019-02-24 DIAGNOSIS — R42 Dizziness and giddiness: Secondary | ICD-10-CM | POA: Diagnosis not present

## 2019-03-03 ENCOUNTER — Other Ambulatory Visit: Payer: Self-pay

## 2019-03-03 DIAGNOSIS — Z20822 Contact with and (suspected) exposure to covid-19: Secondary | ICD-10-CM

## 2019-03-03 DIAGNOSIS — Z20828 Contact with and (suspected) exposure to other viral communicable diseases: Secondary | ICD-10-CM | POA: Diagnosis not present

## 2019-03-05 ENCOUNTER — Telehealth: Payer: Self-pay | Admitting: Family Medicine

## 2019-03-05 LAB — NOVEL CORONAVIRUS, NAA: SARS-CoV-2, NAA: NOT DETECTED

## 2019-03-05 NOTE — Telephone Encounter (Signed)
Negative COVID results given. Patient results "NOT Detected." Caller expressed understanding. ° °

## 2019-03-17 ENCOUNTER — Ambulatory Visit: Payer: Medicare Other | Admitting: Infectious Diseases

## 2019-03-31 ENCOUNTER — Ambulatory Visit: Payer: Medicare Other | Admitting: Infectious Diseases

## 2019-04-06 ENCOUNTER — Ambulatory Visit: Payer: Medicare Other | Admitting: Psychiatry

## 2019-04-08 ENCOUNTER — Ambulatory Visit: Payer: Medicare Other | Admitting: Psychiatry

## 2019-04-09 ENCOUNTER — Ambulatory Visit: Payer: Medicare Other | Admitting: Infectious Diseases

## 2019-04-14 DIAGNOSIS — N3010 Interstitial cystitis (chronic) without hematuria: Secondary | ICD-10-CM | POA: Diagnosis not present

## 2019-04-15 DIAGNOSIS — E7800 Pure hypercholesterolemia, unspecified: Secondary | ICD-10-CM | POA: Diagnosis not present

## 2019-04-15 DIAGNOSIS — E039 Hypothyroidism, unspecified: Secondary | ICD-10-CM | POA: Diagnosis not present

## 2019-04-15 DIAGNOSIS — I10 Essential (primary) hypertension: Secondary | ICD-10-CM | POA: Diagnosis not present

## 2019-04-16 ENCOUNTER — Other Ambulatory Visit: Payer: Self-pay

## 2019-04-16 ENCOUNTER — Ambulatory Visit (INDEPENDENT_AMBULATORY_CARE_PROVIDER_SITE_OTHER): Payer: Medicare Other | Admitting: Psychiatry

## 2019-04-16 DIAGNOSIS — N3010 Interstitial cystitis (chronic) without hematuria: Secondary | ICD-10-CM | POA: Diagnosis not present

## 2019-04-16 DIAGNOSIS — F4323 Adjustment disorder with mixed anxiety and depressed mood: Secondary | ICD-10-CM | POA: Diagnosis not present

## 2019-04-16 DIAGNOSIS — F411 Generalized anxiety disorder: Secondary | ICD-10-CM | POA: Diagnosis not present

## 2019-04-16 DIAGNOSIS — Z638 Other specified problems related to primary support group: Secondary | ICD-10-CM | POA: Diagnosis not present
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
## 2019-04-21 DIAGNOSIS — N3010 Interstitial cystitis (chronic) without hematuria: Secondary | ICD-10-CM | POA: Diagnosis not present

## 2019-04-22 DIAGNOSIS — M25561 Pain in right knee: Secondary | ICD-10-CM | POA: Diagnosis not present

## 2019-04-22 DIAGNOSIS — M1711 Unilateral primary osteoarthritis, right knee: Secondary | ICD-10-CM | POA: Diagnosis not present

## 2019-04-23 ENCOUNTER — Ambulatory Visit: Payer: Medicare Other | Admitting: Psychiatry

## 2019-04-23 DIAGNOSIS — N3010 Interstitial cystitis (chronic) without hematuria: Secondary | ICD-10-CM | POA: Diagnosis not present

## 2019-04-23 DIAGNOSIS — M25561 Pain in right knee: Secondary | ICD-10-CM | POA: Diagnosis not present

## 2019-04-23 DIAGNOSIS — M25562 Pain in left knee: Secondary | ICD-10-CM | POA: Diagnosis not present

## 2019-04-24 DIAGNOSIS — R42 Dizziness and giddiness: Secondary | ICD-10-CM | POA: Diagnosis not present

## 2019-04-27 ENCOUNTER — Ambulatory Visit: Payer: Medicare Other | Admitting: Psychiatry

## 2019-04-28 ENCOUNTER — Ambulatory Visit: Payer: Medicare Other | Admitting: Psychiatry

## 2019-04-30 ENCOUNTER — Ambulatory Visit: Payer: Medicare Other | Admitting: Psychiatry

## 2019-04-30 DIAGNOSIS — Z6835 Body mass index (BMI) 35.0-35.9, adult: Secondary | ICD-10-CM | POA: Diagnosis not present

## 2019-04-30 DIAGNOSIS — Z03818 Encounter for observation for suspected exposure to other biological agents ruled out: Secondary | ICD-10-CM | POA: Diagnosis not present

## 2019-04-30 DIAGNOSIS — R05 Cough: Secondary | ICD-10-CM | POA: Diagnosis not present

## 2019-04-30 DIAGNOSIS — T50905A Adverse effect of unspecified drugs, medicaments and biological substances, initial encounter: Secondary | ICD-10-CM | POA: Diagnosis not present

## 2019-05-01 DIAGNOSIS — Z03818 Encounter for observation for suspected exposure to other biological agents ruled out: Secondary | ICD-10-CM | POA: Diagnosis not present

## 2019-05-01 DIAGNOSIS — R05 Cough: Secondary | ICD-10-CM | POA: Diagnosis not present

## 2019-05-05 ENCOUNTER — Ambulatory Visit: Payer: Medicare Other | Admitting: Infectious Diseases

## 2019-05-05 DIAGNOSIS — M25561 Pain in right knee: Secondary | ICD-10-CM | POA: Diagnosis not present

## 2019-05-05 DIAGNOSIS — M1711 Unilateral primary osteoarthritis, right knee: Secondary | ICD-10-CM | POA: Diagnosis not present

## 2019-05-12 DIAGNOSIS — M1711 Unilateral primary osteoarthritis, right knee: Secondary | ICD-10-CM | POA: Diagnosis not present

## 2019-05-14 ENCOUNTER — Ambulatory Visit: Payer: Medicare Other | Admitting: Psychiatry

## 2019-05-21 ENCOUNTER — Ambulatory Visit: Payer: Medicare Other | Admitting: Psychiatry

## 2019-05-28 ENCOUNTER — Ambulatory Visit: Payer: Medicare Other | Admitting: Psychiatry

## 2019-05-28 DIAGNOSIS — R399 Unspecified symptoms and signs involving the genitourinary system: Secondary | ICD-10-CM | POA: Diagnosis not present

## 2019-05-28 DIAGNOSIS — R42 Dizziness and giddiness: Secondary | ICD-10-CM | POA: Diagnosis not present

## 2019-05-31 ENCOUNTER — Ambulatory Visit: Payer: Medicare Other

## 2019-06-01 DIAGNOSIS — M25561 Pain in right knee: Secondary | ICD-10-CM | POA: Diagnosis not present

## 2019-06-01 DIAGNOSIS — M1711 Unilateral primary osteoarthritis, right knee: Secondary | ICD-10-CM | POA: Diagnosis not present

## 2019-06-02 ENCOUNTER — Ambulatory Visit: Payer: Medicare Other | Admitting: Infectious Diseases

## 2019-06-04 ENCOUNTER — Ambulatory Visit: Payer: Medicare Other | Admitting: Infectious Diseases

## 2019-06-09 ENCOUNTER — Ambulatory Visit: Payer: Medicare Other | Admitting: Psychiatry

## 2019-06-10 DIAGNOSIS — E7800 Pure hypercholesterolemia, unspecified: Secondary | ICD-10-CM | POA: Diagnosis not present

## 2019-06-10 DIAGNOSIS — E039 Hypothyroidism, unspecified: Secondary | ICD-10-CM | POA: Diagnosis not present

## 2019-06-10 DIAGNOSIS — I10 Essential (primary) hypertension: Secondary | ICD-10-CM | POA: Diagnosis not present

## 2019-06-11 ENCOUNTER — Ambulatory Visit: Payer: Medicare Other | Admitting: Infectious Diseases

## 2019-06-15 DIAGNOSIS — Z5181 Encounter for therapeutic drug level monitoring: Secondary | ICD-10-CM | POA: Diagnosis not present

## 2019-06-15 DIAGNOSIS — G894 Chronic pain syndrome: Secondary | ICD-10-CM | POA: Diagnosis not present

## 2019-06-15 DIAGNOSIS — M25561 Pain in right knee: Secondary | ICD-10-CM | POA: Diagnosis not present

## 2019-06-15 DIAGNOSIS — M48062 Spinal stenosis, lumbar region with neurogenic claudication: Secondary | ICD-10-CM | POA: Diagnosis not present

## 2019-06-15 DIAGNOSIS — M47816 Spondylosis without myelopathy or radiculopathy, lumbar region: Secondary | ICD-10-CM | POA: Diagnosis not present

## 2019-06-15 DIAGNOSIS — M25562 Pain in left knee: Secondary | ICD-10-CM | POA: Diagnosis not present

## 2019-06-22 ENCOUNTER — Telehealth: Payer: Self-pay

## 2019-06-22 NOTE — Telephone Encounter (Signed)
COVID-19 Pre-Screening Questions:  Do you currently have a fever (>100 F), chills or unexplained body aches? NO   Are you currently experiencing new cough, shortness of breath, sore throat, runny nose?NO .  Have you recently travelled outside the state of [place] in the last 14 days? NO .  Have you been in contact with someone that is currently pending confirmation of Covid19 testing or has been confirmed to have the Covid19 virus?  NO  **If the patient answers NO to ALL questions -  advise the patient to please call the clinic before coming to the office should any symptoms develop.     

## 2019-06-23 ENCOUNTER — Other Ambulatory Visit: Payer: Self-pay

## 2019-06-23 ENCOUNTER — Ambulatory Visit (INDEPENDENT_AMBULATORY_CARE_PROVIDER_SITE_OTHER): Payer: Medicare Other | Admitting: Infectious Diseases

## 2019-06-23 ENCOUNTER — Encounter: Payer: Self-pay | Admitting: Infectious Diseases

## 2019-06-23 DIAGNOSIS — R5383 Other fatigue: Secondary | ICD-10-CM | POA: Diagnosis not present

## 2019-06-23 DIAGNOSIS — R946 Abnormal results of thyroid function studies: Secondary | ICD-10-CM | POA: Diagnosis not present

## 2019-06-23 DIAGNOSIS — G4730 Sleep apnea, unspecified: Secondary | ICD-10-CM | POA: Diagnosis not present

## 2019-06-23 DIAGNOSIS — Z1239 Encounter for other screening for malignant neoplasm of breast: Secondary | ICD-10-CM | POA: Diagnosis not present

## 2019-06-23 DIAGNOSIS — I10 Essential (primary) hypertension: Secondary | ICD-10-CM

## 2019-06-23 DIAGNOSIS — E7800 Pure hypercholesterolemia, unspecified: Secondary | ICD-10-CM | POA: Diagnosis not present

## 2019-06-23 DIAGNOSIS — N3010 Interstitial cystitis (chronic) without hematuria: Secondary | ICD-10-CM | POA: Diagnosis not present

## 2019-06-23 DIAGNOSIS — R05 Cough: Secondary | ICD-10-CM | POA: Diagnosis not present

## 2019-06-23 DIAGNOSIS — N3090 Cystitis, unspecified without hematuria: Secondary | ICD-10-CM

## 2019-06-23 DIAGNOSIS — R42 Dizziness and giddiness: Secondary | ICD-10-CM | POA: Diagnosis not present

## 2019-06-23 DIAGNOSIS — Z1211 Encounter for screening for malignant neoplasm of colon: Secondary | ICD-10-CM | POA: Diagnosis not present

## 2019-06-23 DIAGNOSIS — E559 Vitamin D deficiency, unspecified: Secondary | ICD-10-CM | POA: Diagnosis not present

## 2019-06-23 DIAGNOSIS — N390 Urinary tract infection, site not specified: Secondary | ICD-10-CM | POA: Diagnosis not present

## 2019-06-23 NOTE — Assessment & Plan Note (Addendum)
Is doing home monitoring.  Measurements going directly to her PCPs office.  Has had elevated SBP, attributes to pain.

## 2019-06-23 NOTE — Assessment & Plan Note (Signed)
She is s/p completing augmentin.  She still has sx, as she usually does.  Has incomplete emptying, for many years.  Will refill her augmentin as she needs (currently has 1 refill) or monural sachets x 2.  Continue to defer IV anbx.  Will get her results from Dr Logan Bores.  Cranberry juice supplements if she would like, we discussed the evidence behind this is weak.  rtc in 1 month.

## 2019-06-23 NOTE — Progress Notes (Signed)
Subjective:    Patient ID: Jennifer [name], female    DOB: 14-Apr-1945, 74 y.o.   MRN: [id number]  HPI 74yo F with hxHTN,fibromyalgia, gerd, possible IBS, [facility] She had 12 months of bladder infection afterwards. 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 She self cath's and has had difficulty keeping with boiling her cath's.  Has had multiple UTI with E coli as well as P mirabilis. She was found on CT2016to have small kidney stone.   Previouslyhas been on macrobid, kelfex (abd rash/fungus), IV gent (01-2015), augmentin. As well ascranberry extract.  UCx (03-14-15) E coli- ESBL, (S- augmentin, zosyn, imipenem, gent/tobra, nitro).  She was seen in ID 03-2015 and was given prn rx for augmentin as well as education on UTI and colonization.  UCx 11-7 E coli (> 100k) S- unasyn/augmentin, amikacin/gent, Ceftaz/Cefepime/cefoxitin, imi/mero/ertapenem, bactrim, zosyn, nitro. UCx11-2018 for UTI. UA (-) for LE and Nitr. + bacteria. She was given fosfomycin.    Her cx grew E coli- ESBL.  s- nitro, augmentin, gent, imipenem.     UCx 05-2017 E coli (same sensi). Shecontinues to get bladder PT at [facility].   She had R knee surgery for a torn meniscus, ganglion cyst 04-2018. Has had persistent pain. Injections, PT. Had minimal relief with injections.   She was seen by PCP for sinusitis 02-2019 and received augmentin. She again received augmentin in January for UTI from urology.   She continues to have knee and back pain. She is considering having "vertiflex" procedure on her back. She is trying to avoid back surgery, was told it would take 2 days to complete (6h each day) by ortho, neurosurgery defered her.  Is taking hydrocodone.  Next nerve block is next month, then nerve ablation.   Has missed many elmeron treatments due to her ongoing back issues.     Esbl escherichia coli 12-2018   URINE  CULTURE, REFLEX    AMOX/CLAVULANIC 4  Sensitive    AMPICILLIN >=32  Resistant1    AMPICILLIN/SULBACTAM 4  Sensitive    CEFAZOLIN >=64  Resistant2    CEFEPIME 2  Resistant    CEFTRIAXONE >=64  Resistant    CIPROFLOXACIN >=4  Resistant    ERTAPENEM <=0.5  Sensitive    GENTAMICIN <=1  Sensitive    IMIPENEM <=0.25  Sensitive    LEVOFLOXACIN >=8  Resistant    NITROFURANTOIN <=16  Sensitive    PIP/TAZO <=4  Sensitive    TOBRAMYCIN <=1  Sensitive    TRIMETH/SULFA >=320  Resistant3    Pending cologaurd.  Has had 3 COVID tests. (-).   Review of Systems  Constitutional: Positive for chills. Negative for fever.  Gastrointestinal: Positive for constipation and diarrhea.  Genitourinary: Positive for difficulty urinating and dysuria.  takes prn imodium Please see HPI. All other systems reviewed and negative.     Objective:   Physical Exam Constitutional:      Appearance: She is obese.  HENT:     Mouth/Throat:     Mouth: Mucous membranes are moist.     Pharynx: No oropharyngeal exudate.  Eyes:     Extraocular Movements: Extraocular movements intact.     Pupils: Pupils are equal, round, and reactive to light.  Cardiovascular:     Rate and Rhythm: Normal rate and regular rhythm.  Pulmonary:     Effort: Pulmonary effort is normal.     Breath sounds: Normal breath sounds.  Abdominal:  General: Bowel sounds are normal.     Palpations: Abdomen is soft.     Tenderness: There is no right CVA tenderness or left CVA tenderness.  Musculoskeletal:     Cervical back: Normal range of motion and neck supple.     Right lower leg: No edema.     Left lower leg: No edema.  Neurological:     General: No focal deficit present.     Mental Status: She is alert.  Psychiatric:        Mood and Affect: Mood normal.           Assessment & Plan:

## 2019-06-29 ENCOUNTER — Ambulatory Visit (INDEPENDENT_AMBULATORY_CARE_PROVIDER_SITE_OTHER): Payer: Medicare Other | Admitting: Psychiatry

## 2019-06-29 DIAGNOSIS — M48061 Spinal stenosis, lumbar region without neurogenic claudication: Secondary | ICD-10-CM | POA: Diagnosis not present

## 2019-06-29 DIAGNOSIS — M797 Fibromyalgia: Secondary | ICD-10-CM

## 2019-06-29 DIAGNOSIS — N3090 Cystitis, unspecified without hematuria: Secondary | ICD-10-CM

## 2019-06-29 DIAGNOSIS — F4323 Adjustment disorder with mixed anxiety and depressed mood: Secondary | ICD-10-CM

## 2019-06-29 DIAGNOSIS — F411 Generalized anxiety disorder: Secondary | ICD-10-CM | POA: Diagnosis not present

## 2019-06-29 DIAGNOSIS — M1711 Unilateral primary osteoarthritis, right knee: Secondary | ICD-10-CM
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
## 2019-07-02 DIAGNOSIS — M47816 Spondylosis without myelopathy or radiculopathy, lumbar region: Secondary | ICD-10-CM | POA: Diagnosis not present

## 2019-07-07 ENCOUNTER — Ambulatory Visit (INDEPENDENT_AMBULATORY_CARE_PROVIDER_SITE_OTHER): Payer: Medicaid Other | Admitting: Psychiatry

## 2019-07-07 DIAGNOSIS — M48061 Spinal stenosis, lumbar region without neurogenic claudication: Secondary | ICD-10-CM

## 2019-07-07 DIAGNOSIS — Z639 Problem related to primary support group, unspecified: Secondary | ICD-10-CM

## 2019-07-07 DIAGNOSIS — F411 Generalized anxiety disorder: Secondary | ICD-10-CM | POA: Diagnosis not present

## 2019-07-07 NOTE — Progress Notes (Signed)
Psychotherapy Progress Note [facility] Marliss Czar, PhD LP  Patient ID: ZI SEK     MRN: [id number] Therapy format: Individual psychotherapy Date: 07/07/2019      Start: 4:25     Stop: 5:15p     Time Spent: 50 min Location: Telehealth visit -- I connected with this patient by an approved telecommunication method (audio only), with her informed consent, and verifying identity and patient privacy.  I was located at my office and patient at her home.  As needed, we discussed the limitations, risks, and security and privacy concerns associated with telehealth service, including the availability and conditions which currently govern in-person appointments and the possibility that 3rd-party payment may not be fully guaranteed and she may be responsible for charges.  After she indicated understanding, we proceeded with the session.  Also discussed treatment planning, as needed, including ongoing verbal agreement with the plan, the opportunity to ask and answer all questions, her demonstrated understanding of instructions, and her readiness to call the office should symptoms worsen or she feels she is in a crisis state and needs more immediate and tangible assistance.   Session narrative (presenting needs, interim history, self-report of stressors and symptoms, applications of prior therapy, status changes, and interventions made in session) Benefit of back injections, but still down too much to make in-person appt.  Question about   Rumor that NCDLs will be putting a star on to signify vaccinations and that the star will be required for air travel and other purposes.  (Haven't heard, sounds like conspiracy rumor or misunderstanding of ideas being floated for some form of national virus safety passport.)  Back to [name], narrates recent story of tense dealings while Pt borrowed her car, caustic comments, signs of dementia possibly, lack of saying thank you for favors done, a  number of other things that can be irritating and trigger feeling second-classed or impressions of rudeness.  Validated feeling unequally treated in some measure while reminding her that their relationship is overlaid for several years now with favors asked, including borrowing the car, and [name]' own self-care is quite compromised with untreated sleep apnea, objections to treatments of other health matters, her legacy of unwanted marriage, and now full-time companionship of her adult daughter with a degenerative disease.  Clarified not that Pt has to give her a pass, but good reasons not to expect her to be as expertly courteous as PT was trained to be (an old theme in therapy as re. Pt's dealings with friends).  Coached in graceful response to being unfairly accused, e.g., "You never do what you say you're going to do!" -- "OK, I know you're frustrated, but is that literally true?  Never?"  Contrasted with PT's instincts to fight back or keep dutiful silence as a "good" Christian.  Reminded of biblical advice to give even without hope of gain or thanks, meant as a freeing word, not as a moral stricture.  Nerve ablation next week well wishes given.  Therapeutic modalities: Cognitive Behavioral Therapy, Solution-Oriented/Positive Psychology and Faith-sensitive  Mental Status/Observations:  Appearance:   Not assessed     Behavior:  Monopolizing  Motor:  Not assessed  Speech/Language:   Clear and Coherent  Affect:  Not assessed  Mood:  some anxiety, mildly irritable (with subject)  Thought process:  circumstantial and run-on  Thought content:    Rumination  Sensory/Perceptual disturbances:    WNL  Orientation:  Fully oriented  Attention:  Fair    Concentration:  Good  Memory:  WNL  Insight:    Fair  Judgment:   Good  Impulse Control:  Good   Risk Assessment: Danger to Self: No Self-injurious Behavior: No Danger to Others: No Physical Aggression / Violence: No Duty to Warn: No Access to  Firearms a concern: No  Assessment of progress:  stabilized  Diagnosis:   ICD-10-CM   1. Generalized anxiety disorder  F41.1   2. Relationship dysfunction  Z63.9   3. Spinal stenosis, lumbar region, without neurogenic claudication  M48.061    Plan:  . Mentally rehearse, try out as able -- emotionally accepting, factually questioning reply to exaggerate/negative complaint . Make sure not to resent friend's lack of perceived courtesy -- OK to ask thanks, OK to say if hurt, just don't let it fester . Other recommendations/advice as may be noted above . Continue to utilize previously learned skills ad lib . Maintain medication as prescribed and work faithfully with relevant prescriber(s) if any changes are desired or seem indicated . Call the clinic on-call service, present to ER, or call 911 if any life-threatening psychiatric crisis Return for session(s) already scheduled. . Already scheduled visit in this office 07/24/2019.  Blanchie Serve, PhD Luan Moore, PhD LP Clinical Psychologist, [facility] [address] [place], [place] [address] [phone]

## 2019-07-16 DIAGNOSIS — M47816 Spondylosis without myelopathy or radiculopathy, lumbar region: Secondary | ICD-10-CM | POA: Diagnosis not present

## 2019-07-21 ENCOUNTER — Ambulatory Visit: Payer: Medicare Other | Admitting: Infectious Diseases

## 2019-07-24 ENCOUNTER — Ambulatory Visit: Payer: Medicare Other | Admitting: Psychiatry

## 2019-07-28 ENCOUNTER — Ambulatory Visit: Payer: Medicare Other | Admitting: Infectious Diseases

## 2019-08-04 ENCOUNTER — Ambulatory Visit: Payer: Medicare Other | Admitting: Psychiatry

## 2019-08-04 DIAGNOSIS — I10 Essential (primary) hypertension: Secondary | ICD-10-CM | POA: Diagnosis not present

## 2019-08-04 DIAGNOSIS — E7800 Pure hypercholesterolemia, unspecified: Secondary | ICD-10-CM | POA: Diagnosis not present

## 2019-08-04 DIAGNOSIS — E039 Hypothyroidism, unspecified: Secondary | ICD-10-CM | POA: Diagnosis not present

## 2019-08-06 DIAGNOSIS — R399 Unspecified symptoms and signs involving the genitourinary system: Secondary | ICD-10-CM | POA: Diagnosis not present

## 2019-08-07 DIAGNOSIS — Z79899 Other long term (current) drug therapy: Secondary | ICD-10-CM | POA: Diagnosis not present

## 2019-08-07 DIAGNOSIS — E7800 Pure hypercholesterolemia, unspecified: Secondary | ICD-10-CM | POA: Diagnosis not present

## 2019-08-07 DIAGNOSIS — I10 Essential (primary) hypertension: Secondary | ICD-10-CM | POA: Diagnosis not present

## 2019-08-07 DIAGNOSIS — R946 Abnormal results of thyroid function studies: Secondary | ICD-10-CM | POA: Diagnosis not present

## 2019-08-07 DIAGNOSIS — M9983 Other biomechanical lesions of lumbar region: Secondary | ICD-10-CM | POA: Diagnosis not present

## 2019-08-07 DIAGNOSIS — L309 Dermatitis, unspecified: Secondary | ICD-10-CM | POA: Diagnosis not present

## 2019-08-07 DIAGNOSIS — N390 Urinary tract infection, site not specified: Secondary | ICD-10-CM | POA: Diagnosis not present

## 2019-08-10 DIAGNOSIS — G894 Chronic pain syndrome: Secondary | ICD-10-CM | POA: Diagnosis not present

## 2019-08-10 DIAGNOSIS — M47816 Spondylosis without myelopathy or radiculopathy, lumbar region: Secondary | ICD-10-CM | POA: Diagnosis not present

## 2019-08-10 DIAGNOSIS — M25562 Pain in left knee: Secondary | ICD-10-CM | POA: Diagnosis not present

## 2019-08-10 DIAGNOSIS — M25561 Pain in right knee: Secondary | ICD-10-CM | POA: Diagnosis not present

## 2019-08-10 DIAGNOSIS — M48062 Spinal stenosis, lumbar region with neurogenic claudication: Secondary | ICD-10-CM | POA: Diagnosis not present

## 2019-08-10 DIAGNOSIS — M792 Neuralgia and neuritis, unspecified: Secondary | ICD-10-CM | POA: Diagnosis not present

## 2019-08-20 ENCOUNTER — Ambulatory Visit: Payer: Medicare Other | Admitting: Psychiatry

## 2019-08-25 ENCOUNTER — Ambulatory Visit: Payer: Medicare Other | Admitting: Infectious Diseases

## 2019-08-25 DIAGNOSIS — R399 Unspecified symptoms and signs involving the genitourinary system: Secondary | ICD-10-CM | POA: Diagnosis not present

## 2019-09-04 ENCOUNTER — Ambulatory Visit: Payer: Medicare Other | Admitting: Psychiatry

## 2019-09-08 DIAGNOSIS — N390 Urinary tract infection, site not specified: Secondary | ICD-10-CM | POA: Diagnosis not present

## 2019-09-08 DIAGNOSIS — N3010 Interstitial cystitis (chronic) without hematuria: Secondary | ICD-10-CM | POA: Diagnosis not present

## 2019-09-10 ENCOUNTER — Ambulatory Visit (INDEPENDENT_AMBULATORY_CARE_PROVIDER_SITE_OTHER): Payer: Medicare Other | Admitting: Psychiatry

## 2019-09-10 ENCOUNTER — Other Ambulatory Visit: Payer: Self-pay

## 2019-09-10 ENCOUNTER — Ambulatory Visit: Payer: Medicare Other | Admitting: Infectious Diseases

## 2019-09-10 DIAGNOSIS — M48061 Spinal stenosis, lumbar region without neurogenic claudication: Secondary | ICD-10-CM

## 2019-09-10 DIAGNOSIS — N3090 Cystitis, unspecified without hematuria: Secondary | ICD-10-CM

## 2019-09-10 DIAGNOSIS — F411 Generalized anxiety disorder: Secondary | ICD-10-CM

## 2019-09-10 DIAGNOSIS — Z639 Problem related to primary support group, unspecified: Secondary | ICD-10-CM

## 2019-09-10 DIAGNOSIS — F4323 Adjustment disorder with mixed anxiety and depressed mood: Secondary | ICD-10-CM

## 2019-09-10 DIAGNOSIS — M797 Fibromyalgia: Secondary | ICD-10-CM | POA: Diagnosis not present

## 2019-09-10 NOTE — Progress Notes (Signed)
Psychotherapy Progress Note [facility] Luan Moore, PhD LP  Patient ID: Jennifer [name]     MRN: [id number] Therapy format: Individual psychotherapy Date: 09/10/2019      Start: 1:13p     Stop: 2:13p     Time Spent: 45 min (remainder donated) Location: In-person   Session narrative (presenting needs, interim history, self-report of stressors and symptoms, applications of prior therapy, status changes, and interventions made in session) Having a hard time with back and bladder issues lately.  Had left lumbar nerve ablation mid-April, had 20-day downtime afterward with fierce pain.  2nd ablation postponed by repeat UTIs (e. Coli), presently intent to do in July but has 6/17 date.  Urologist is vouching for her safety and put her on continuous Keflex.  Thinking she needs to cancel, wait until she sees Dr. Posey Pronto, and RS to July, but encouraged she could confirm acceptance of Dr. Amalia Hailey' assurance and keep the date.  Meanwhile, new concern with Phyllis's life drama, including her daughter with alleged CP signing over POA and getting put in a group home without coordinating with her mother -- historical, and a point of bitterness for Silva Bandy, backdrop to the story of Tammy getting kicked out and coming to live with [place].  PT's own experience with Lynelle Smoke has been that she is forbidding, fake, or cutting nearly every encounter.  Continuing entanglement of PT borrowing Phyllis's car intermittently to run errands, make appointments, and recent issues with Silva Bandy' dog needing veterinary trip, her falling and needing urgent medical care, faced with 12-hr wait at ER, was coolheaded help when Tammy panicked, and upshot of the story is how neither one thanked her for her help (driving, managing, visiting while fighting UTI) and putting up with bratty behavior from [place].  Ultimately, testimony how she held her tongue and exercised what we have long called "industrial strength  serenity".  Therapeutic modalities: Ego-Supportive, Faith-sensitive and Assertiveness/Communication  Mental Status/Observations:  Appearance:   Casual and Neat     Behavior:  Appropriate  Motor:  Normal  Speech/Language:   Clear and Coherent, loquacious/detailed (baseline)  Affect:  Appropriate  Mood:  mildly irritated, with subject  Thought process:  normal  Thought content:    WNL  Sensory/Perceptual disturbances:    WNL  Orientation:  Fully oriented  Attention:  Good    Concentration:  Good  Memory:  WNL  Insight:    Good  Judgment:   Good  Impulse Control:  Good   Risk Assessment: Danger to Self: No Self-injurious Behavior: No Danger to Others: No Physical Aggression / Violence: No Duty to Warn: No Access to Firearms a concern: No  Assessment of progress:  stabilized  Diagnosis:   ICD-10-CM   1. Generalized anxiety disorder  F41.1   2. Adjustment disorder with mixed anxiety and depressed mood  F43.23   3. Spinal stenosis, lumbar region, without neurogenic claudication  M48.061   4. Fibromyalgia  M79.7   5. Relationship dysfunction  Z63.9   6. 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 Self-affirm holding her tongue when needed, resisting pugnacious reactions . Notice and forgive quickly if unrealistically counting on her own version of manners from people she trusts . Other recommendations/advice as may be noted above . Continue to utilize previously learned skills ad lib . Maintain medication as prescribed and work faithfully with relevant prescriber(s) if any changes are desired or seem indicated . Call the clinic on-call service, present to ER, or  call 911 if any life-threatening psychiatric crisis Return for time as available. . Already scheduled visit in this office 09/25/2019.  Robley Fries, PhD Marliss Czar, PhD LP Clinical Psychologist, [facility] [address] [place],  [place] [address] [phone]

## 2019-09-25 ENCOUNTER — Ambulatory Visit: Payer: Medicare Other | Admitting: Psychiatry

## 2019-09-29 DIAGNOSIS — I10 Essential (primary) hypertension: Secondary | ICD-10-CM | POA: Diagnosis not present

## 2019-09-29 DIAGNOSIS — E7800 Pure hypercholesterolemia, unspecified: Secondary | ICD-10-CM | POA: Diagnosis not present

## 2019-09-29 DIAGNOSIS — E039 Hypothyroidism, unspecified: Secondary | ICD-10-CM | POA: Diagnosis not present

## 2019-10-08 ENCOUNTER — Ambulatory Visit: Payer: Medicare Other | Admitting: Infectious Diseases

## 2019-10-13 DIAGNOSIS — R399 Unspecified symptoms and signs involving the genitourinary system: Secondary | ICD-10-CM | POA: Diagnosis not present

## 2019-10-15 ENCOUNTER — Ambulatory Visit: Payer: Medicare Other | Admitting: Infectious Diseases

## 2019-10-15 ENCOUNTER — Other Ambulatory Visit: Payer: Self-pay

## 2019-10-15 ENCOUNTER — Ambulatory Visit: Payer: Medicare Other | Admitting: Psychiatry

## 2019-10-15 ENCOUNTER — Ambulatory Visit (INDEPENDENT_AMBULATORY_CARE_PROVIDER_SITE_OTHER): Payer: Medicare Other | Admitting: Infectious Diseases

## 2019-10-15 ENCOUNTER — Encounter: Payer: Self-pay | Admitting: Infectious Diseases

## 2019-10-15 DIAGNOSIS — B379 Candidiasis, unspecified: Secondary | ICD-10-CM | POA: Diagnosis not present

## 2019-10-15 DIAGNOSIS — N3090 Cystitis, unspecified without hematuria: Secondary | ICD-10-CM | POA: Diagnosis present

## 2019-10-15 MED ORDER — AMOXICILLIN-POT CLAVULANATE 875-125 MG PO TABS: 1.0000 | ORAL_TABLET | Freq: Two times a day (BID) | ORAL | 0 refills | Status: DC

## 2019-10-15 MED ORDER — FOSFOMYCIN TROMETHAMINE 3 G PO PACK: 3.0000 g | PACK | Freq: Once | ORAL | 0 refills | Status: AC

## 2019-10-15 MED ORDER — FLUCONAZOLE 100 MG PO TABS: 100.0000 mg | ORAL_TABLET | Freq: Every day | ORAL | 0 refills | Status: DC

## 2019-10-15 NOTE — Addendum Note (Signed)
Addended by: Romanita Fager C on: 10/15/2019 05:10 PM   Modules accepted: Orders

## 2019-10-15 NOTE — Assessment & Plan Note (Addendum)
She is on augmentin with planned 10 days.  Will extend this to 21 days, change to 875mg  bid (vs TID 500mg ).  Will give her 1 dose of fosfomycin as well.  Will not plan on rechecking her UCx.  I asked her to f/u with Dr regarding further elmiron injections.  Will see her back in 3 weeks.  Asked her to defer nerve ablation until she is on anbx for at least 2 weeks.  She will take benadryl prn for prurities.

## 2019-10-15 NOTE — Progress Notes (Signed)
Subjective:    Patient ID: Jennifer [name], female    DOB: June 10, 1945, 74 y.o.   MRN: [id number]  HPI 74yo F with hxHTN,fibromyalgia, gerd, possible IBS, [facility] She had 12 months of bladder infection afterwards. 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 She self cath's and has had difficulty keeping with boiling her cath's.  Has had multiple UTI with E coli as well as P mirabilis. She was found on CT2016to have small kidney stone.   Previouslyhas been on macrobid, kelfex (abd rash/fungus), IV gent (01-2015), augmentin. As well ascranberry extract.  UCx (03-14-15) E coli- ESBL, (S- augmentin, zosyn, imipenem, gent/tobra, nitro).  She was seen in ID 03-2015 and was given prn rx for augmentin as well as education on UTI and colonization.  UCx 11-7 E coli (> 100k) S- unasyn/augmentin, amikacin/gent, Ceftaz/Cefepime/cefoxitin, imi/mero/ertapenem, bactrim, zosyn, nitro. UCx11-2018 for UTI. UA (-) for LE and Nitr. + bacteria. She was given fosfomycin.    Her cx grew E coli- ESBL.  s- nitro, augmentin, gent, imipenem.     UCx 05-2017 E coli (same sensi). UCx 12-2018 E coli Shecontinues to get bladder PT at [facility].   ShehadR knee surgery for a torn meniscus, ganglion cyst1-2020. Has had persistent pain. Injections, PT. Had minimal relief with injections.   She was seen by PCP for sinusitis 02-2019 and received augmentin. She again received augmentin in January for UTI from urology.    She saw Dr Logan Bores in June and was rec to have chronic anbx suppression in an attempt to prep her for surgery. She had nerve ablation July 16, 2019.   She states she has had re-current bladder infections since then. She has had E coli in her Cx. She cannot have repeat ablation til she is off anbx, infection free for 2 weeks.  Her infection 3 weeks ago was K pneumonia. She was given keflex 500mg  bid for 7  days. Her repeat UCx again showed Klebsiella. She had another round of keflex which gave her pruritis.  She had a repeat UCx which grew E coli ESBL (S- carbapenem, gent, cefepime, unasyn, augmentin, zosyn). She is going to get Augmentin 500mg  tid for 10 days. She is interested in being on suppressive anbx til she can have another ablation. Her back pain f/u appt is pending.   Takes her temp - always 97-98.   Self caths at home.   Review of Systems  Constitutional: Negative for chills and fever.  Gastrointestinal: Positive for abdominal pain. Negative for constipation and diarrhea.  Genitourinary: Positive for dysuria and frequency.  Musculoskeletal: Positive for arthralgias and back pain.  Please see HPI. All other systems reviewed and negative.      Objective:   Physical Exam Vitals reviewed.  Constitutional:      Appearance: Normal appearance. She is obese.  HENT:     Mouth/Throat:     Mouth: Mucous membranes are moist.     Pharynx: No oropharyngeal exudate.  Eyes:     Extraocular Movements: Extraocular movements intact.     Pupils: Pupils are equal, round, and reactive to light.  Cardiovascular:     Rate and Rhythm: Normal rate and regular rhythm.  Pulmonary:     Effort: Pulmonary effort is normal.     Breath sounds: Normal breath sounds.  Abdominal:     General: Bowel sounds are normal. There is no distension.     Palpations: Abdomen is soft.  Tenderness: There is no abdominal tenderness.  Musculoskeletal:        General: Normal range of motion.     Cervical back: Normal range of motion and neck supple.     Right lower leg: Edema present.     Left lower leg: Edema present.  Neurological:     General: No focal deficit present.     Mental Status: She is alert.  Psychiatric:        Mood and Affect: Mood normal.           Assessment & Plan:

## 2019-10-15 NOTE — Assessment & Plan Note (Addendum)
She has topical nystatin which she has been using. She has also been using triamcinolone which I asked her to avoid as it may exacerbate this.  Will write her rx for fluconazole.  Cautioned her about valium and fluconazole interaction.

## 2019-10-16 DIAGNOSIS — M792 Neuralgia and neuritis, unspecified: Secondary | ICD-10-CM | POA: Diagnosis not present

## 2019-10-16 DIAGNOSIS — M48062 Spinal stenosis, lumbar region with neurogenic claudication: Secondary | ICD-10-CM | POA: Diagnosis not present

## 2019-10-16 DIAGNOSIS — G894 Chronic pain syndrome: Secondary | ICD-10-CM | POA: Diagnosis not present

## 2019-10-16 DIAGNOSIS — M47816 Spondylosis without myelopathy or radiculopathy, lumbar region: Secondary | ICD-10-CM | POA: Diagnosis not present

## 2019-10-19 ENCOUNTER — Ambulatory Visit: Payer: Medicare Other

## 2019-10-20 ENCOUNTER — Ambulatory Visit: Payer: Medicare Other | Admitting: Infectious Diseases

## 2019-10-28 ENCOUNTER — Telehealth: Payer: Self-pay

## 2019-10-28 DIAGNOSIS — B379 Candidiasis, unspecified: Secondary | ICD-10-CM

## 2019-10-28 NOTE — Telephone Encounter (Signed)
Patient states she is currently still experiencing suprapubic pain, burring, and frequenent urination even after increasing Augmentin dosage. Patient also states that she did take a dose of her urogesic  Blue that shekeeps on hand as needed. Patient also requesting extension of fluconazole.  Patient  Routing to Dr. Ninetta Lights for advise.

## 2019-10-30 ENCOUNTER — Ambulatory Visit: Payer: Medicare Other | Admitting: Psychiatry

## 2019-10-31 DIAGNOSIS — E7800 Pure hypercholesterolemia, unspecified: Secondary | ICD-10-CM | POA: Diagnosis not present

## 2019-10-31 DIAGNOSIS — I10 Essential (primary) hypertension: Secondary | ICD-10-CM | POA: Diagnosis not present

## 2019-10-31 DIAGNOSIS — E039 Hypothyroidism, unspecified: Secondary | ICD-10-CM | POA: Diagnosis not present

## 2019-11-02 DIAGNOSIS — H43812 Vitreous degeneration, left eye: Secondary | ICD-10-CM | POA: Diagnosis not present

## 2019-11-02 MED ORDER — FLUCONAZOLE 100 MG PO TABS: 100.0000 mg | ORAL_TABLET | Freq: Every day | ORAL | 0 refills | Status: DC

## 2019-11-02 NOTE — Addendum Note (Signed)
Addended by: Valarie Cones on: 11/02/2019 02:24 PM   Modules accepted: Orders

## 2019-11-02 NOTE — Telephone Encounter (Signed)
Attempted to call patient to inform her that medication refill was approved by Dr. Ninetta Lights and sent to pharmacy. If patient returns call please make her aware. Thanks

## 2019-11-02 NOTE — Telephone Encounter (Signed)
Please refill fluconazole thanks

## 2019-11-03 NOTE — Telephone Encounter (Signed)
Patient made aware. Jennifer [name]  

## 2019-11-05 ENCOUNTER — Other Ambulatory Visit: Payer: Self-pay

## 2019-11-05 ENCOUNTER — Ambulatory Visit (INDEPENDENT_AMBULATORY_CARE_PROVIDER_SITE_OTHER): Payer: Medicare Other | Admitting: Psychiatry

## 2019-11-05 DIAGNOSIS — Z639 Problem related to primary support group, unspecified: Secondary | ICD-10-CM

## 2019-11-05 DIAGNOSIS — M797 Fibromyalgia: Secondary | ICD-10-CM

## 2019-11-05 DIAGNOSIS — F411 Generalized anxiety disorder: Secondary | ICD-10-CM

## 2019-11-05 DIAGNOSIS — M48061 Spinal stenosis, lumbar region without neurogenic claudication: Secondary | ICD-10-CM

## 2019-11-05 DIAGNOSIS — M1711 Unilateral primary osteoarthritis, right knee: Secondary | ICD-10-CM

## 2019-11-05 DIAGNOSIS — N3090 Cystitis, unspecified without hematuria: Secondary | ICD-10-CM

## 2019-11-05 NOTE — Progress Notes (Signed)
Psychotherapy Progress Note [facility] Marliss Czar, PhD LP  Patient ID: Jennifer [name]     MRN: [id number] Therapy format: Individual psychotherapy Date: 11/05/2019      Start: 4:20p     Stop: 5:10p     Time Spent: 50 min Location: In-person   Session narrative (presenting needs, interim history, self-report of stressors and symptoms, applications of prior therapy, status changes, and interventions made in session) Unvaccinated as yet.  Continuing medica problems, most notably delays getting lumbar nerve ablation while still battling chronic bladder infections.  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    Main interest today re. friend Jennifer [name], from whom she often borrows a car, on whom she come to depend, and about whom she suspects dementia developing.  Still tends to take too many things personally, main complaints being how unreliable Jennifer [name] is for accurately keeping her calendar and being able to keep track and fulfill promises.  Tends to make last-minute changes, get her own schedule wrong, and other mistakes which spoil or nearly spoil times when PT is dependent on her to make high-stakes treatment.  Discussed moments of great anger, mostly edited by Pt, and her performance managing her sharp tongue.  Affirmed self-control in most respects, encouraged be alert to when she is counting on or demanding to be free from chaos or shown more thoughtfulness.    Has befriended another woman, Jennifer [name], at urologist's office and can get some rides from her.  Informed of ride services the major health systems may have, encouraged look into them to diversify her dependency.  Therapeutic modalities: Cognitive Behavioral Therapy, Solution-Oriented/Positive Psychology, Ego-Supportive and Assertiveness/Communication  Mental Status/Observations:  Appearance:   Casual and Neat     Behavior:   Appropriate  Motor:  Normal and exc knee pain related  Speech/Language:   Clear and Coherent and some over-detail  Affect:  Appropriate  Mood:  irritable and responsive  Thought process:  normal  Thought content:    WNL  Sensory/Perceptual disturbances:    WNL  Orientation:  Fully oriented  Attention:  Good    Concentration:  Good  Memory:  WNL  Insight:    Fair  Judgment:   Good  Impulse Control:  Good   Risk Assessment: Danger to Self: No Self-injurious Behavior: No Danger to Others: No Physical Aggression / Violence: No Duty to Warn: No Access to Firearms a concern: No  Assessment of progress:  stabilized  Diagnosis:   ICD-10-CM   1. Generalized anxiety disorder  F41.1   2. Spinal stenosis, lumbar region, without neurogenic claudication  M48.061   3. Fibromyalgia  M79.7   4. 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 Primary osteoarthritis of right knee  M17.11    Plan:  . Be alert to demands placed on others . Diversify as able resources for getting rides and other dependencies . Other recommendations/advice as may be noted above . Continue to utilize previously learned skills ad lib . Maintain medication as prescribed and work faithfully with relevant prescriber(s) if any changes are desired or seem indicated . Call the clinic on-call service, present to ER, or call 911 if any life-threatening psychiatric crisis Return for time as available. . Already scheduled visit in this office 11/26/2019.  Robley Fries, PhD Marliss Czar, PhD LP Clinical Psychologist, [facility] [address], [address]  [place], [place] [address] ([phone]

## 2019-11-06 DIAGNOSIS — Z03818 Encounter for observation for suspected exposure to other biological agents ruled out: Secondary | ICD-10-CM | POA: Diagnosis not present

## 2019-11-06 DIAGNOSIS — Z20822 Contact with and (suspected) exposure to covid-19: Secondary | ICD-10-CM | POA: Diagnosis not present

## 2019-11-06 DIAGNOSIS — Z1159 Encounter for screening for other viral diseases: Secondary | ICD-10-CM | POA: Diagnosis not present

## 2019-11-12 DIAGNOSIS — I10 Essential (primary) hypertension: Secondary | ICD-10-CM | POA: Diagnosis not present

## 2019-11-16 ENCOUNTER — Other Ambulatory Visit (HOSPITAL_BASED_OUTPATIENT_CLINIC_OR_DEPARTMENT_OTHER): Payer: Self-pay | Admitting: Family Medicine

## 2019-11-16 DIAGNOSIS — R944 Abnormal results of kidney function studies: Secondary | ICD-10-CM

## 2019-11-17 ENCOUNTER — Ambulatory Visit: Payer: Self-pay

## 2019-11-19 ENCOUNTER — Ambulatory Visit: Payer: Medicare Other | Admitting: Infectious Diseases

## 2019-11-20 ENCOUNTER — Ambulatory Visit (HOSPITAL_BASED_OUTPATIENT_CLINIC_OR_DEPARTMENT_OTHER): Payer: Medicare Other

## 2019-11-23 ENCOUNTER — Ambulatory Visit (HOSPITAL_BASED_OUTPATIENT_CLINIC_OR_DEPARTMENT_OTHER): Payer: Medicare Other

## 2019-11-24 ENCOUNTER — Ambulatory Visit: Payer: Self-pay

## 2019-11-25 ENCOUNTER — Telehealth: Payer: Self-pay

## 2019-11-25 ENCOUNTER — Ambulatory Visit (HOSPITAL_BASED_OUTPATIENT_CLINIC_OR_DEPARTMENT_OTHER): Payer: Medicare Other

## 2019-11-25 NOTE — Telephone Encounter (Signed)
COVID-19 Pre-Screening Questions:11/25/19  Do you currently have a fever (>100 F), chills or unexplained body aches?NO  Are you currently experiencing new cough, shortness of breath, sore throat, runny nose? NO .  Have you been in contact with someone that is currently pending confirmation of Covid19 testing or has been confirmed to have the Covid19 virus?  *NO  **If the patient answers NO to ALL questions -  advise the patient to please call the clinic before coming to the office should any symptoms develop.     

## 2019-11-26 ENCOUNTER — Ambulatory Visit: Payer: Medicare Other | Admitting: Psychiatry

## 2019-11-26 ENCOUNTER — Encounter: Payer: Self-pay | Admitting: Infectious Diseases

## 2019-11-26 ENCOUNTER — Other Ambulatory Visit: Payer: Self-pay

## 2019-11-26 ENCOUNTER — Ambulatory Visit (INDEPENDENT_AMBULATORY_CARE_PROVIDER_SITE_OTHER): Payer: Medicare Other | Admitting: Infectious Diseases

## 2019-11-26 ENCOUNTER — Other Ambulatory Visit (HOSPITAL_BASED_OUTPATIENT_CLINIC_OR_DEPARTMENT_OTHER): Payer: Medicare Other

## 2019-11-26 VITALS — Wt 201.0 lb

## 2019-11-26 DIAGNOSIS — I10 Essential (primary) hypertension: Secondary | ICD-10-CM

## 2019-11-26 DIAGNOSIS — B379 Candidiasis, unspecified: Secondary | ICD-10-CM

## 2019-11-26 DIAGNOSIS — N3090 Cystitis, unspecified without hematuria: Secondary | ICD-10-CM

## 2019-11-26 NOTE — Assessment & Plan Note (Signed)
She is working on control with PCP.  Home BP monitoring.

## 2019-11-26 NOTE — Progress Notes (Signed)
   Subjective:    Patient ID: Jennifer [name], female    DOB: 1945-04-26, 74 y.o.   MRN: [id number]  HPI 74yo F with hxHTN,fibromyalgia, gerd, possible IBS, [facility] She had 12 months of bladder infection afterwards. 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 She self cath's and has had difficulty keeping with boiling her cath's.  Has had multiple UTI with E coli as well as P mirabilis. She was found on CT2016to have small kidney stone.   Previouslyhas been on macrobid, kelfex (abd rash/fungus), IV gent (01-2015), augmentin. As well ascranberry extract.  UCx (03-14-15) E coli- ESBL, (S- augmentin, zosyn, imipenem, gent/tobra, nitro).  She was seen in ID 03-2015 and was given prn rx for augmentin as well as education on UTI and colonization.  UCx 11-7 E coli (> 100k) S- unasyn/augmentin, amikacin/gent, Ceftaz/Cefepime/cefoxitin, imi/mero/ertapenem, bactrim, zosyn, nitro. UCx11-2018 for UTI. UA (-) for LE and Nitr. + bacteria. She was given fosfomycin.    Her cx grew E coli- ESBL.  s- nitro, augmentin, gent, imipenem.     UCx 05-2017 E coli (same sensi). UCx 12-2018 E coli UCx 10-2019 K pneumo UCx 10-2019 E coli (ESBL)  ShehadR knee surgery for a torn meniscus, ganglion cyst1-2020. Has had persistent pain. Injections, PT. Had minimal relief with injections.   Self caths at home.    Today- still miserable. She took amoxil after last visit. After she completed has had pain, burninig. Saw her PCP for her BP and is now doing home BP monitoring with digital monitor.   Her numbers at home with >200 SBP.  She is worried that her kidney function is off from her interpretation of a recent conversation with PCP nurse. Her prev Cr were < 1.0. She is scheduled for a u/s soon.    Has uro f/u October 2021.     She has not had her R Lumbar nerve ablation yet (sched for 02-02-20).   Review of Systems   Constitutional: Negative for chills and fever.  Gastrointestinal: Negative for constipation and diarrhea.  Genitourinary: Positive for decreased urine volume, dysuria (has improved) and frequency. Negative for difficulty urinating.  Musculoskeletal: Positive for back pain.  Please see HPI. All other systems reviewed and negative.      Objective:   Physical Exam Vitals reviewed.  HENT:     Mouth/Throat:     Mouth: Mucous membranes are moist.     Pharynx: No oropharyngeal exudate.  Eyes:     Extraocular Movements: Extraocular movements intact.     Pupils: Pupils are equal, round, and reactive to light.  Cardiovascular:     Rate and Rhythm: Normal rate and regular rhythm.  Pulmonary:     Effort: Pulmonary effort is normal.     Breath sounds: Normal breath sounds.  Abdominal:     General: Bowel sounds are normal. There is no distension.     Palpations: Abdomen is soft.     Tenderness: There is no abdominal tenderness. There is right CVA tenderness (mild) and left CVA tenderness (mild).  Musculoskeletal:     Cervical back: Normal range of motion and neck supple.  Neurological:     Mental Status: She is alert.  Psychiatric:        Mood and Affect: Mood normal.        Speech: Speech is rapid and pressured.           Assessment & Plan:

## 2019-11-26 NOTE — Assessment & Plan Note (Signed)
She seems to be around her baseline.  She is trying a supplement rec by her PCP App. We discussed whether she needs to be in hospital (no), whether she needs anbx right now (she feels like she has good days and bad). She is worried that she may go into sepsis (seems unlikely as long as she gets into care in a reasonable amt of time).  She will get UCx UA.  Will hold on anbx for now.  She will f/u with uro January 12, 2020.  rtc in 5-6 weeks.

## 2019-11-26 NOTE — Assessment & Plan Note (Signed)
Will continue flucon on a prn basis.

## 2019-11-28 LAB — URINALYSIS, ROUTINE W REFLEX MICROSCOPIC
Bacteria, UA: NONE SEEN /HPF
Bilirubin Urine: NEGATIVE
Glucose, UA: NEGATIVE
Ketones, ur: NEGATIVE
Nitrite: NEGATIVE
Specific Gravity, Urine: 1.02 (ref 1.001–1.03)
WBC, UA: >=60 /HPF — AB (ref 0–5)
pH: 5.5 (ref 5.0–8.0)

## 2019-11-28 LAB — URINE CULTURE
MICRO NUMBER:: 10880261
SPECIMEN QUALITY:: ADEQUATE

## 2019-11-30 ENCOUNTER — Ambulatory Visit (HOSPITAL_BASED_OUTPATIENT_CLINIC_OR_DEPARTMENT_OTHER): Payer: Medicare Other

## 2019-12-01 ENCOUNTER — Ambulatory Visit: Payer: Medicare Other | Admitting: Infectious Diseases

## 2019-12-01 ENCOUNTER — Telehealth (INDEPENDENT_AMBULATORY_CARE_PROVIDER_SITE_OTHER): Payer: Medicare Other | Admitting: Infectious Diseases

## 2019-12-01 ENCOUNTER — Telehealth: Payer: Self-pay

## 2019-12-01 DIAGNOSIS — N3090 Cystitis, unspecified without hematuria: Secondary | ICD-10-CM

## 2019-12-01 DIAGNOSIS — B379 Candidiasis, unspecified: Secondary | ICD-10-CM

## 2019-12-01 MED ORDER — FLUCONAZOLE 100 MG PO TABS: 100.0000 mg | ORAL_TABLET | Freq: Every day | ORAL | 1 refills | Status: DC

## 2019-12-01 MED ORDER — AMOXICILLIN-POT CLAVULANATE 875-125 MG PO TABS: 1.0000 | ORAL_TABLET | Freq: Two times a day (BID) | ORAL | 1 refills | Status: DC

## 2019-12-01 NOTE — Telephone Encounter (Signed)
Feeling 'pretty miserable'. Having suprapubic pain, frequency (every 30 minutes).   She has canceled her nerve ablation due to her sx (was to be this week).  Will send in augmentin, will keep her on this til she has her ablation, to continue 1 week after her procedure.  Will refill her fluconazole for prn use as well Will ee her back in 6 weeks.

## 2019-12-01 NOTE — Telephone Encounter (Signed)
Patient called office today to follow up on culture results. States she is using the bathroom more frequently and feels miserable. Would like MD to call with results. Lorenso Courier, [place]

## 2019-12-14 ENCOUNTER — Other Ambulatory Visit: Payer: Self-pay

## 2019-12-14 ENCOUNTER — Ambulatory Visit (HOSPITAL_BASED_OUTPATIENT_CLINIC_OR_DEPARTMENT_OTHER)
Admission: RE | Admit: 2019-12-14 | Discharge: 2019-12-14 | Disposition: A | Discharge status: Home / Self Care | Payer: Medicare Other | Source: Ambulatory Visit | Attending: Family Medicine | Admitting: Family Medicine

## 2019-12-14 DIAGNOSIS — R944 Abnormal results of kidney function studies: Secondary | ICD-10-CM | POA: Diagnosis not present

## 2019-12-15 ENCOUNTER — Ambulatory Visit: Payer: Medicare Other | Attending: Internal Medicine

## 2019-12-15 DIAGNOSIS — I10 Essential (primary) hypertension: Secondary | ICD-10-CM | POA: Diagnosis not present

## 2019-12-15 DIAGNOSIS — F419 Anxiety disorder, unspecified: Secondary | ICD-10-CM | POA: Diagnosis not present

## 2019-12-15 DIAGNOSIS — Z7189 Other specified counseling: Secondary | ICD-10-CM | POA: Diagnosis not present

## 2019-12-15 DIAGNOSIS — Z23 Encounter for immunization: Secondary | ICD-10-CM

## 2019-12-15 NOTE — Progress Notes (Signed)
   Covid-19 Vaccination Clinic  Name:  Jennifer [name]    MRN: [id number] DOB: April 11, 1945  12/15/2019  Jennifer [name] was observed post Covid-19 immunization for 15 minutes without incident. She was provided with Vaccine Information Sheet and instruction to access the V-Safe system. Vaccinated By: Tresea Mall.  Jennifer [name] was instructed to call 911 with any severe reactions post vaccine: Marland Kitchen Difficulty breathing  . Swelling of face and throat  . A fast heartbeat  . A bad rash all over body  . Dizziness and weakness   Immunizations Administered    Name Date Dose VIS Date Route   Moderna COVID-19 Vaccine 12/15/2019  2:26 PM 0.5 mL 03/2019 Intramuscular   Manufacturer: Moderna   Lot: [id number]   NDC: 02111-552-08

## 2020-01-05 ENCOUNTER — Ambulatory Visit: Payer: Medicare Other | Admitting: Infectious Diseases

## 2020-01-12 DIAGNOSIS — K582 Mixed irritable bowel syndrome: Secondary | ICD-10-CM | POA: Diagnosis not present

## 2020-01-12 DIAGNOSIS — N3010 Interstitial cystitis (chronic) without hematuria: Secondary | ICD-10-CM | POA: Diagnosis not present

## 2020-01-12 DIAGNOSIS — N390 Urinary tract infection, site not specified: Secondary | ICD-10-CM | POA: Diagnosis not present

## 2020-01-12 DIAGNOSIS — M797 Fibromyalgia: Secondary | ICD-10-CM | POA: Diagnosis not present

## 2020-01-13 DIAGNOSIS — R899 Unspecified abnormal finding in specimens from other organs, systems and tissues: Secondary | ICD-10-CM | POA: Diagnosis not present

## 2020-01-14 ENCOUNTER — Ambulatory Visit: Payer: Medicare Other | Admitting: Infectious Diseases

## 2020-01-15 ENCOUNTER — Telehealth: Payer: Self-pay

## 2020-01-15 NOTE — Telephone Encounter (Addendum)
Paige from Dr. Logan Bores office patient's urologist is calling stating patient's urine culture showed positive for a UTI. Dr. Logan Bores is wanting to treat her with Augmentin or Macrobid which are both susceptible. Patient's allergy list is showing she is allergic to both medications. Dr. Logan Bores would like to know your opinion on what the patient should be treated with. Patient states she has been prescribed Augmentin by Dr. Ninetta Lights in the past and tolerated well. Please advise Jarica Plass T [name]

## 2020-01-15 NOTE — Telephone Encounter (Signed)
Per Dr. Ninetta Lights patient is ok to take Augmentin and has tolerated it in the past. I have left a message with Idalia Needle, RN with Dr. Logan Bores office to let her know it is ok for the patient to take Augmentin.  Mitsue Peery T [name]

## 2020-01-18 ENCOUNTER — Ambulatory Visit: Payer: Medicare Other

## 2020-01-18 DIAGNOSIS — Z5181 Encounter for therapeutic drug level monitoring: Secondary | ICD-10-CM | POA: Diagnosis not present

## 2020-01-18 DIAGNOSIS — G894 Chronic pain syndrome: Secondary | ICD-10-CM | POA: Diagnosis not present

## 2020-01-18 DIAGNOSIS — M47816 Spondylosis without myelopathy or radiculopathy, lumbar region: Secondary | ICD-10-CM | POA: Diagnosis not present

## 2020-01-18 DIAGNOSIS — M792 Neuralgia and neuritis, unspecified: Secondary | ICD-10-CM | POA: Diagnosis not present

## 2020-01-20 DIAGNOSIS — I10 Essential (primary) hypertension: Secondary | ICD-10-CM | POA: Diagnosis not present

## 2020-01-20 DIAGNOSIS — E039 Hypothyroidism, unspecified: Secondary | ICD-10-CM | POA: Diagnosis not present

## 2020-01-20 DIAGNOSIS — E7800 Pure hypercholesterolemia, unspecified: Secondary | ICD-10-CM | POA: Diagnosis not present

## 2020-01-21 ENCOUNTER — Ambulatory Visit (INDEPENDENT_AMBULATORY_CARE_PROVIDER_SITE_OTHER): Payer: Medicare Other | Admitting: Infectious Diseases

## 2020-01-21 ENCOUNTER — Other Ambulatory Visit: Payer: Self-pay

## 2020-01-21 ENCOUNTER — Encounter: Payer: Self-pay | Admitting: Infectious Diseases

## 2020-01-21 DIAGNOSIS — B379 Candidiasis, unspecified: Secondary | ICD-10-CM

## 2020-01-21 DIAGNOSIS — I10 Essential (primary) hypertension: Secondary | ICD-10-CM

## 2020-01-21 DIAGNOSIS — N3090 Cystitis, unspecified without hematuria: Secondary | ICD-10-CM | POA: Diagnosis not present

## 2020-01-21 MED ORDER — FLUCONAZOLE 100 MG PO TABS: 100.0000 mg | ORAL_TABLET | Freq: Every day | ORAL | 1 refills | Status: DC

## 2020-01-21 NOTE — Assessment & Plan Note (Signed)
She will stay on her augmentin til she is 3 days post procedure.  Will see her back in 1 month.  She has had 1 dose of Covid vaccine. I encouraged her to get 2nd and then booster in 6 months.

## 2020-01-21 NOTE — Assessment & Plan Note (Signed)
Will refill her fluconazole.  

## 2020-01-21 NOTE — Progress Notes (Signed)
Subjective:    Patient ID: BRIARROSE SHOR, female    DOB: [birth year], 74 y.o.   MRN: [id number]  HPI  74yo F with hxHTN,fibromyalgia, gerd, possible IBS, and [facility] She had 12 months of bladder infection afterwards. 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 She self cath's and has had difficulty keeping with boiling her cath's.  Has had multiple UTI with E coli as well as P mirabilis. She was found on CT2016to have small kidney stone.  Previouslyhas been on macrobid, kelfex (abd rash/fungus), IV gent (01-2015), augmentin.   UCx (03-14-15) E coli- ESBL, (S- augmentin, zosyn, imipenem, gent/tobra, nitro).  She was seen in ID 03-2015 and was given prn rx for augmentin as well as education on UTI and colonization.  UCx 11-7 E coli (> 100k) S- unasyn/augmentin, amikacin/gent, Ceftaz/Cefepime/cefoxitin, imi/mero/ertapenem, bactrim, zosyn, nitro. UCx11-2018 for UTI. UA (-) for LE and Nitr. + bacteria. She was given fosfomycin.    Her cx grew E coli- ESBL.  s- nitro, augmentin, gent, imipenem.     UCx 05-2017 E coli (same sensi). UCx 12-2018 E coli UCx 10-2019 K pneumo UCx 10-2019 E coli (ESBL)              Has uro f/u October 2021 and has been noted to have elevated Cr over the summer (0.8 --> 1.2). She had renal u/s 12-14-19 which was normal.               She called ID office earlier this month with worsening urogenital pain (and Cx at Uro that grew E coli) and was given rx for augmentin. She is not having diarrhea but has had increased BM (tid today). Also having gas. Taking Gasx. Has also taken occas prn imodium. Not diet related.   She is going to try taking d-mannose.                Has been having worsening pain from her back.  She has not had her R Lumbar nerve ablation yet (sched for 02-05-20).   Review of Systems  Constitutional: Negative for appetite change, chills, fever and unexpected  weight change.  Respiratory: Negative for shortness of breath.   Cardiovascular: Negative for chest pain.  Gastrointestinal: Negative for constipation and diarrhea (having frequent BM, tid).  Genitourinary: Positive for frequency. Negative for difficulty urinating, dysuria and urgency.  Musculoskeletal: Positive for back pain.  Skin: Positive for rash.  Neurological: Negative for headaches.  Please see HPI. All other systems reviewed and negative.      Objective:   Physical Exam Vitals reviewed.  Constitutional:      Appearance: Normal appearance.  HENT:     Mouth/Throat:     Mouth: Mucous membranes are moist.     Pharynx: No oropharyngeal exudate.  Eyes:     Extraocular Movements: Extraocular movements intact.     Pupils: Pupils are equal, round, and reactive to light.  Cardiovascular:     Rate and Rhythm: Normal rate and regular rhythm.  Pulmonary:     Effort: Pulmonary effort is normal.     Breath sounds: Normal breath sounds.  Abdominal:     General: Bowel sounds are normal. There is no distension.     Palpations: Abdomen is soft.     Tenderness: There is no abdominal tenderness.  Musculoskeletal:        General: Normal range of motion.     Cervical back: Normal range of  motion and neck supple.     Right lower leg: No edema.     Left lower leg: No edema.  Skin:    General: Skin is warm.  Neurological:     Mental Status: She is alert.  Psychiatric:        Mood and Affect: Mood normal.        Speech: Speech is rapid and pressured.           Assessment & Plan:

## 2020-01-21 NOTE — Assessment & Plan Note (Signed)
She was elevated to 160/95 manually today. She attributes to pain from her back. She is o/w asx. She will f/u with PCP.

## 2020-01-25 ENCOUNTER — Ambulatory Visit: Payer: Medicare Other

## 2020-01-26 ENCOUNTER — Ambulatory Visit: Payer: Medicare Other

## 2020-02-01 ENCOUNTER — Ambulatory Visit: Payer: Medicare Other | Attending: Internal Medicine

## 2020-02-01 ENCOUNTER — Other Ambulatory Visit (HOSPITAL_BASED_OUTPATIENT_CLINIC_OR_DEPARTMENT_OTHER): Payer: Self-pay | Admitting: Internal Medicine

## 2020-02-01 DIAGNOSIS — Z23 Encounter for immunization: Secondary | ICD-10-CM

## 2020-02-01 NOTE — Progress Notes (Signed)
   Covid-19 Vaccination Clinic  Name:  Jennifer [name]    MRN: [id number] DOB: 07/27/1945  02/01/2020  [name] was observed post Covid-19 immunization for 15 minutes without incident. She was provided with Vaccine Information Sheet and instruction to access the V-Safe system.   [name] was instructed to call 911 with any severe reactions post vaccine: Marland Kitchen Difficulty breathing  . Swelling of face and throat  . A fast heartbeat  . A bad rash all over body  . Dizziness and weakness   Immunizations Administered    Name Date Dose VIS Date Route   Moderna COVID-19 Vaccine 02/01/2020  2:54 PM 0.5 mL 01/20/2020 Intramuscular   Manufacturer: Moderna   Lot: [id number]   NDC: 96295-284-13

## 2020-02-10 MED FILL — MODERNA COVID-19 VACCINE 10: 100 | 1 days supply | Qty: 0 | Fill #0

## 2020-02-11 ENCOUNTER — Ambulatory Visit: Payer: Medicare Other | Admitting: Psychiatry

## 2020-02-16 ENCOUNTER — Ambulatory Visit: Payer: Medicare Other | Admitting: Psychiatry

## 2020-02-23 ENCOUNTER — Ambulatory Visit: Payer: Medicare Other | Admitting: Infectious Diseases

## 2020-03-01 ENCOUNTER — Ambulatory Visit: Payer: Medicare Other | Admitting: Infectious Diseases

## 2020-03-03 ENCOUNTER — Ambulatory Visit: Payer: Medicare Other | Admitting: Psychiatry

## 2020-03-10 DIAGNOSIS — M47816 Spondylosis without myelopathy or radiculopathy, lumbar region: Secondary | ICD-10-CM | POA: Diagnosis not present

## 2020-03-22 ENCOUNTER — Ambulatory Visit: Payer: Medicare Other | Admitting: Psychiatry

## 2020-03-24 ENCOUNTER — Ambulatory Visit: Payer: Medicare Other | Admitting: Infectious Diseases

## 2020-04-07 ENCOUNTER — Ambulatory Visit: Payer: Medicare Other | Admitting: Infectious Diseases

## 2020-04-12 ENCOUNTER — Ambulatory Visit: Payer: Medicare Other | Admitting: Infectious Diseases

## 2020-04-14 ENCOUNTER — Ambulatory Visit: Payer: Medicare Other | Admitting: Infectious Diseases

## 2020-04-15 DIAGNOSIS — M47816 Spondylosis without myelopathy or radiculopathy, lumbar region: Secondary | ICD-10-CM | POA: Diagnosis not present

## 2020-04-15 DIAGNOSIS — M48062 Spinal stenosis, lumbar region with neurogenic claudication: Secondary | ICD-10-CM | POA: Diagnosis not present

## 2020-04-15 DIAGNOSIS — M7062 Trochanteric bursitis, left hip: Secondary | ICD-10-CM | POA: Diagnosis not present

## 2020-04-15 DIAGNOSIS — G894 Chronic pain syndrome: Secondary | ICD-10-CM | POA: Diagnosis not present

## 2020-04-15 DIAGNOSIS — M7061 Trochanteric bursitis, right hip: Secondary | ICD-10-CM | POA: Diagnosis not present

## 2020-04-22 ENCOUNTER — Ambulatory Visit: Payer: Medicare Other | Admitting: Psychiatry

## 2020-04-28 ENCOUNTER — Ambulatory Visit (INDEPENDENT_AMBULATORY_CARE_PROVIDER_SITE_OTHER): Payer: Medicare Other | Admitting: Infectious Diseases

## 2020-04-28 ENCOUNTER — Encounter: Payer: Self-pay | Admitting: Infectious Diseases

## 2020-04-28 ENCOUNTER — Ambulatory Visit: Payer: Medicare Other | Admitting: Infectious Diseases

## 2020-04-28 ENCOUNTER — Other Ambulatory Visit: Payer: Self-pay

## 2020-04-28 DIAGNOSIS — N3010 Interstitial cystitis (chronic) without hematuria: Secondary | ICD-10-CM | POA: Diagnosis not present

## 2020-04-28 DIAGNOSIS — G8929 Other chronic pain: Secondary | ICD-10-CM | POA: Diagnosis not present

## 2020-04-28 DIAGNOSIS — N3090 Cystitis, unspecified without hematuria: Secondary | ICD-10-CM

## 2020-04-28 DIAGNOSIS — M545 Low back pain, unspecified: Secondary | ICD-10-CM | POA: Diagnosis not present

## 2020-04-28 DIAGNOSIS — M549 Dorsalgia, unspecified: Secondary | ICD-10-CM | POA: Insufficient documentation

## 2020-04-28 NOTE — Assessment & Plan Note (Signed)
Excuse note written for [employer] duty

## 2020-04-28 NOTE — Progress Notes (Signed)
   Subjective:    Patient ID: Jennifer [name], female    DOB: 10-Dec-1945, 75 y.o.   MRN: [id number]  HPI 75yo F with hxHTN,fibromyalgia, gerd, possible IBS, and [facility] She had 12 months of bladder infection afterwards. 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 She self cath's and has had difficulty keeping with boiling her cath's.  Previouslyhas been on macrobid, kelfex (abd rash/fungus), IV gent (01-2015), augmentin.   UCx (03-14-15) E coli- ESBL, (S- augmentin, zosyn, imipenem, gent/tobra, nitro).  She was seen in ID 03-2015 and was given prn rx for augmentin as well as education on UTI and colonization.  UCx 11-7 E coli (> 100k) S- unasyn/augmentin, amikacin/gent, Ceftaz/Cefepime/cefoxitin, imi/mero/ertapenem, bactrim, zosyn, nitro. UCx11-2018 for UTI. UA (-) for LE and Nitr. + bacteria. She was given fosfomycin.    Her cx grew E coli- ESBL.  s- nitro, augmentin, gent, imipenem.     UCx 05-2017 E coli (same sensi). UCx 12-2018 E coli UCx 10-2019 K pneumo UCx 10-2019 E coli (ESBL)  Has uro f/u October 2021 and has been noted to have elevated Cr over the summer (0.8 --> 1.2). She had renal u/s 12-14-19 which was normal.   She started d-mannose. She saw Dr Logan Bores today, had clear UA despite thinking she has UTI. Her Urine has been more clear.   She has had Lumbar nerve ablation (sched for 03-10-20). Has been having pain since, today is only 3rd day she has been out. She got lumbar bursa steroid injections and has had improvement with this. Will have S1 injections February.    Does not have a car, gets a ride from a friend. Apartment is good.   Review of Systems  Constitutional: Negative for chills and fever.  Respiratory: Negative for shortness of breath.   Cardiovascular: Negative for chest pain and leg swelling.  Genitourinary: Positive for difficulty urinating and dysuria.  Negative for hematuria.  Musculoskeletal: Positive for back pain.       Objective:   Physical Exam Vitals reviewed.  Constitutional:      Appearance: She is obese.  HENT:     Mouth/Throat:     Mouth: Mucous membranes are moist.     Pharynx: No oropharyngeal exudate.  Eyes:     Extraocular Movements: Extraocular movements intact.     Pupils: Pupils are equal, round, and reactive to light.  Cardiovascular:     Rate and Rhythm: Normal rate and regular rhythm.  Pulmonary:     Effort: Pulmonary effort is normal.     Breath sounds: Normal breath sounds.  Abdominal:     General: Bowel sounds are normal. There is no distension.     Palpations: Abdomen is soft.     Tenderness: There is no abdominal tenderness.  Musculoskeletal:     Cervical back: Normal range of motion and neck supple.     Right lower leg: No edema.     Left lower leg: No edema.  Neurological:     Mental Status: She is alert.           Assessment & Plan:

## 2020-04-28 NOTE — Assessment & Plan Note (Signed)
She appears to be doing well Will hold on further anbx while her UCx comes from Dr Logan Bores.  She has prn amoxil as well as flucon  She will call us with results.  Will see her back in 3 months

## 2020-05-03 ENCOUNTER — Telehealth: Payer: Self-pay

## 2020-05-03 DIAGNOSIS — B379 Candidiasis, unspecified: Secondary | ICD-10-CM

## 2020-05-03 NOTE — Telephone Encounter (Signed)
Patient is calling stating Urology (Dr. Logan Bores) office called stating her urine culture came back positive for E. Coli.  They have prescribed her amoxicillin 500-125mg  1 tab TID for 7 days. Patient states she is scheduled for steroid injections on 05/11/20.  She would like to know if she should still proceed with having the injections done with her having a UTI and on the antibiotics.  Patient also requesting for a refill on the fluconazole. Leonila Speranza T [name]

## 2020-05-04 MED ORDER — FLUCONAZOLE 100 MG PO TABS: 100.0000 mg | ORAL_TABLET | Freq: Every day | ORAL | 1 refills | Status: DC

## 2020-05-04 NOTE — Telephone Encounter (Signed)
Ok to get injections, ok to refill flucon thanks

## 2020-05-04 NOTE — Addendum Note (Signed)
Addended by: Valarie Cones on: 05/04/2020 04:23 PM   Modules accepted: Orders

## 2020-05-23 DIAGNOSIS — N390 Urinary tract infection, site not specified: Secondary | ICD-10-CM | POA: Diagnosis not present

## 2020-05-26 ENCOUNTER — Ambulatory Visit: Payer: Medicare Other | Admitting: Psychiatry

## 2020-06-02 ENCOUNTER — Ambulatory Visit: Payer: Medicare Other | Admitting: Psychiatry

## 2020-06-03 ENCOUNTER — Ambulatory Visit (INDEPENDENT_AMBULATORY_CARE_PROVIDER_SITE_OTHER): Payer: Medicare Other | Admitting: Psychiatry

## 2020-06-03 ENCOUNTER — Other Ambulatory Visit: Payer: Self-pay

## 2020-06-03 DIAGNOSIS — K219 Gastro-esophageal reflux disease without esophagitis: Secondary | ICD-10-CM

## 2020-06-03 DIAGNOSIS — Z638 Other specified problems related to primary support group: Secondary | ICD-10-CM

## 2020-06-03 DIAGNOSIS — R5382 Chronic fatigue, unspecified: Secondary | ICD-10-CM | POA: Diagnosis not present

## 2020-06-03 DIAGNOSIS — M48061 Spinal stenosis, lumbar region without neurogenic claudication: Secondary | ICD-10-CM

## 2020-06-03 DIAGNOSIS — M797 Fibromyalgia: Secondary | ICD-10-CM

## 2020-06-03 DIAGNOSIS — M1711 Unilateral primary osteoarthritis, right knee: Secondary | ICD-10-CM

## 2020-06-03 DIAGNOSIS — Z639 Problem related to primary support group, unspecified: Secondary | ICD-10-CM

## 2020-06-03 DIAGNOSIS — N3090 Cystitis, unspecified without hematuria: Secondary | ICD-10-CM

## 2020-06-03 DIAGNOSIS — F411 Generalized anxiety disorder: Secondary | ICD-10-CM | POA: Diagnosis not present

## 2020-06-03 NOTE — Progress Notes (Signed)
Psychotherapy Progress Note [facility] Marliss Czar, PhD LP  Patient ID: Jennifer [name]     MRN: [id number] Therapy format: Individual psychotherapy Date: 06/03/2020      Start: 2:10p     Stop: 3:00p     Time Spent: 50 min Location: In-person   Session narrative (presenting needs, interim history, self-report of stressors and symptoms, applications of prior therapy, status changes, and interventions made in session) Last seen 6 months ago. Wants to vent and catch up.  Medically, has lumbar spinal stenosis now, with nerve blocks, nerve ablation, and Dec 2021 double ablation.  Knee continues painful, some return of function from Jan 2020 meniscus surgery but unable to tolerate PT.    Isidoro Donning put her up postop and initiated the last real contact with brother [name].  Last June the next call, 17 months later, which she perceived to be icy as usual, felt wounded.  While [name] has been relationally tone deaf, self-serving in his communications, and plenty self-righteous with her over time, continuing woundedness, resentment, snap negative judgments on her part are also evident.    Prominent c/o friend [name] not calling on her, sometimes laughs at her, overly depends on Barb for help with things like replacing her car, uncouthly assuming she'll pick up things for her (e.g., her own walker, boarding the car), poor financial judgment in her own life, being taken up with her offputting special-needs adult daughter when Lesle Reek needs her, reliably failing to express thanks for things done for her, becoming more smartmouthed and more unreliable about fulfilling promises to be present or make car available.  Support offered her on the litany of complaints warm has, and supportively confronted unrealistically high expectations for a woman who obviously has her own problems and was not raised with the same standards of courtesy Lesle Reek was.  Still tends to take things personally that are  probably just ignorant or lower class.  Encouraged not only to use her "industrial strength serenity" to be patient, but to think more considerably of what may actually be going on with the people she depends on for help.  Encouraged to diversify the help she relies upon, so that she will not feel such urgency about 1 or 2 flawed friends coming through for all the things she needs from them.  Informed about Geographical information systems officer and given phone number and brief description of relevant programs.  Still in 3rd floor apartment, just signed new 5-year lease, and informed she will need a caseworker, which she has not had, at least for this purpose, for 5 years.  C/o feedback recently telling her she is a racist.  Reframed that she is very stereotypically white and easily portrays a sense of class as she knows it that will be glaringly different from the minority black community she lives in.  Probably right that 1 or 2 busy bodies in the black female community around her may well have spoken ill of her and made her the victim of gossip, but the experience she is having is reversed prejudice and something to be learned from and worked with diplomatically more than something to outright complain about.  Therapeutic modalities: Cognitive Behavioral Therapy, Solution-Oriented/Positive Psychology, Ego-Supportive, Assertiveness/Communication and Development worker, international aid Status/Observations:  Appearance:   Casual and Neat     Behavior:  somewhat sour, responsive to constructive confrontation  Motor:  affectd by knee pain  Speech/Language:   Clear and Coherent  Affect:  Appropriate  Mood:  dysthymic  Thought process:  normal  Thought content:    WNL and some rumination  Sensory/Perceptual disturbances:    WNL  Orientation:  Fully oriented  Attention:  Good    Concentration:  Good  Memory:  WNL  Insight:    generally good, social perception fair  Judgment:   Good  Impulse Control:  Fair   Risk  Assessment: Danger to Self: No Self-injurious Behavior: No Danger to Others: No Physical Aggression / Violence: No Duty to Warn: No Access to Firearms a concern: No  Assessment of progress:  stable  Diagnosis:   ICD-10-CM   1. Generalized anxiety disorder  F41.1   2. Relationship dysfunction  Z63.9   3. Primary osteoarthritis of right knee  M17.11   4. Spinal stenosis, lumbar region, without neurogenic claudication  M48.061   5. Chronic fatigue syndrome with fibromyalgia  R53.82    M79.7   6. Recurrent bacterial cystitis  N30.90   7. Relationship problem with family member  Z63.8   8. Gastroesophageal reflux disease, unspecified whether esophagitis present  K21.9    Plan:  . Refer to [employer], information given, encouraged to see through contact and learn well about what they offer, which could be substantial. . Self-affirm that members of her support system are deeply flawed, but wishing, venting, and resenting will not make them do better than they are able to do.  If needed, far better to ask and to teach, by consent, better ways of relating and to make agreements, rather than voice spoiled expectations, less she paid herself far too negative. . Other recommendations/advice as may be noted above . Continue to utilize previously learned skills ad lib . Maintain medication as prescribed and work faithfully with relevant prescriber(s) if any changes are desired or seem indicated . Call the clinic on-call service, present to ER, or call 911 if any life-threatening psychiatric crisis Return in about 1 month (around 07/04/2020). . Already scheduled visit in this office 06/16/2020.  Robley Fries, PhD Marliss Czar, PhD LP Clinical Psychologist, [facility] [address] [place], [place] [address] [phone]

## 2020-06-07 ENCOUNTER — Ambulatory Visit: Payer: Medicare Other | Admitting: Infectious Diseases

## 2020-06-16 ENCOUNTER — Ambulatory Visit: Payer: Medicare Other | Admitting: Psychiatry

## 2020-06-16 ENCOUNTER — Other Ambulatory Visit: Payer: Self-pay

## 2020-06-16 ENCOUNTER — Ambulatory Visit (INDEPENDENT_AMBULATORY_CARE_PROVIDER_SITE_OTHER): Payer: Medicare Other | Admitting: Infectious Diseases

## 2020-06-16 DIAGNOSIS — I10 Essential (primary) hypertension: Secondary | ICD-10-CM | POA: Diagnosis not present

## 2020-06-16 DIAGNOSIS — N3090 Cystitis, unspecified without hematuria: Secondary | ICD-10-CM | POA: Diagnosis not present

## 2020-06-16 DIAGNOSIS — N390 Urinary tract infection, site not specified: Secondary | ICD-10-CM | POA: Diagnosis not present

## 2020-06-16 NOTE — Assessment & Plan Note (Signed)
She is asx but she (and I) recognize that this is quite high.  She will f/u with her PCPs clinical pharmacist to have her meds adjusted.

## 2020-06-16 NOTE — Progress Notes (Signed)
Subjective:    Patient ID: Jennifer [name], female    DOB: 04/08/1945, 75 y.o.   MRN: [id number]  HPI 74yo F with hxHTN,fibromyalgia, gerd, possible IBS, [facility] She had 12 months of bladder infection afterwards. 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 She self cath's and has had difficulty with boiling her cath's.  Previouslyhas been on macrobid, kelfex (abd rash/fungus), IV gent (01-2015), augmentin.   UCx (03-14-15) E coli- ESBL, (S- augmentin, zosyn, imipenem, gent/tobra, nitro). She was seen in ID 03-2015 and was given prn rx for augmentin as well as education on UTI and colonization.  UCx 11-7 E coli (> 100k) S- unasyn/augmentin, amikacin/gent, Ceftaz/Cefepime/cefoxitin, imi/mero/ertapenem, bactrim, zosyn, nitro. UCx11-2018 for UTI. UA (-) for LE and Nitr. + bacteria. She was given fosfomycin.    Her cx grew E coli- ESBL.  s- nitro, augmentin, gent, imipenem.     UCx 05-2017 E coli (same sensi). UCx 12-2018 E coli UCx 10-2019 K pneumo UCx 10-2019 E coli (ESBL)  Has uro f/u October 2021and has been noted to have elevated Cr over the summer (0.8 --> 1.2).She had renal u/s 12-14-19 which was normal.   She had lumbar nerve ablation 03-10-20. She has had hip injections for pain but her SI joint injections were held up due to UTI. She has f/u 07-06-20.  She had elmiron injection with Jennifer [name] 05-18-20. She was noted to have exacerbation of her UTI after that and had repeat course of augmentin 500mg  tid for 7 days sent 2-23 from urology.  With this she has gi upset- nausea, queasy.   She has f/u with urology 07-18-20.  She has also had mental health follow up as well.   Since completing her anbx 2 weeks ago, she has had worsened odor in urine, frequency, hesitancy. Still self caths.  She also has candidiasis in her pannus. She has been putting nystatin/triamcinolone on this (I  counseled her not to use triamcinolone alone).  She saw Uro lab today and had repeat UCx.   She has quit taking D-mannose.   Review of Systems  Constitutional: Negative for chills and fever.  Genitourinary: Positive for decreased urine volume, dysuria, frequency and urgency. Negative for flank pain.  Musculoskeletal: Positive for back pain.  Skin: Positive for rash.  Please see HPI. All other systems reviewed and negative.      Objective:   Physical Exam Vitals reviewed.  Constitutional:      Appearance: Normal appearance. She is obese.  HENT:     Mouth/Throat:     Mouth: Mucous membranes are moist.     Pharynx: No oropharyngeal exudate.  Eyes:     Extraocular Movements: Extraocular movements intact.     Pupils: Pupils are equal, round, and reactive to light.  Cardiovascular:     Rate and Rhythm: Normal rate and regular rhythm.  Pulmonary:     Effort: Pulmonary effort is normal.     Breath sounds: Normal breath sounds.  Abdominal:     General: Bowel sounds are normal. There is no distension.     Palpations: Abdomen is soft.     Tenderness: There is no abdominal tenderness. There is no right CVA tenderness or left CVA tenderness.  Musculoskeletal:     Cervical back: Normal range of motion and neck supple.     Right lower leg: No edema.     Left lower leg: No edema.  Neurological:  Mental Status: She is alert.  Psychiatric:        Mood and Affect: Mood normal.           Assessment & Plan:

## 2020-06-16 NOTE — Assessment & Plan Note (Signed)
I offered to start her on monurol. She wants to wait.  Will try to give her something different that the augmentin to help her GI tract.  Will call the lab at Dr Michel Harrow office and make a treatment recommendation based on her UCx.  [phone] [facility])

## 2020-06-17 ENCOUNTER — Telehealth: Payer: Self-pay | Admitting: Infectious Diseases

## 2020-06-17 NOTE — Telephone Encounter (Signed)
Called Uro lab for her Cx result- It has grown E coli. Sensi are still pending.  They will fax when complete. Will call Ms Haught when results complete.

## 2020-06-20 ENCOUNTER — Telehealth: Payer: Self-pay | Admitting: Infectious Diseases

## 2020-06-20 DIAGNOSIS — N3090 Cystitis, unspecified without hematuria: Secondary | ICD-10-CM

## 2020-06-20 MED ORDER — AMOXICILLIN-POT CLAVULANATE 875-125 MG PO TABS: 1.0000 | ORAL_TABLET | Freq: Two times a day (BID) | ORAL | 0 refills | Status: DC

## 2020-06-20 NOTE — Telephone Encounter (Signed)
Called and left VM Sent in rx for her.

## 2020-06-20 NOTE — Telephone Encounter (Signed)
called pt's lab- Her UCx is E coli S- Amp/S, Imipenem R- Aztreonam, amp, Cefotaxime, cefazolin, ceftriaxone, cefuroxime, cipro, levaquin, bactrim.   Called pt- left VM Sent in Augmentin for 7 days.

## 2020-06-20 NOTE — Telephone Encounter (Signed)
Patient called following up with Dr. Ninetta Lights. Stated she never heard from him on Friday and was concerned about results. Informed her we're waiting on results from urology.   Patient requesting follow up call with Dr. Ninetta Lights to discuss pain management regimen/guidance for her pain management team. Informed patient that Dr Ninetta Lights isn't in the office until Friday but will relay message to provider.   Carli Lefevers Loyola Mast, RN

## 2020-06-20 NOTE — Telephone Encounter (Addendum)
Dr. Logan Bores (urologist) sent in Augmentin but different dosage and times per day. Patient called to see which dosage to go with (Dr. Ninetta Lights vs Dr. Logan Bores). Instructed patient to defer to Dr. Moshe Cipro prescription given history and sensitivities. Patient reports having leftovers from previous prescriptions and will start taking tonight. Reviewed dosage and duration. Still plans on following up with our office next week. Instructed patient to have Dr. Logan Bores office call us with any questions/concerns  Arwa Yero Loyola Mast, RN

## 2020-06-20 NOTE — Telephone Encounter (Signed)
Patient received call from Urology and scheduled follow up with Dr. Ninetta Lights for next Tuesday at 3pm to review results.

## 2020-06-20 NOTE — Telephone Encounter (Signed)
Di urology fax the result? thanks

## 2020-06-23 ENCOUNTER — Ambulatory Visit: Payer: Medicare Other | Admitting: Sports Medicine

## 2020-06-28 ENCOUNTER — Ambulatory Visit: Payer: Medicare Other | Admitting: Infectious Diseases

## 2020-06-30 ENCOUNTER — Ambulatory Visit: Payer: Medicare Other | Admitting: Psychiatry

## 2020-06-30 ENCOUNTER — Ambulatory Visit: Payer: Medicare Other | Admitting: Sports Medicine

## 2020-07-05 DIAGNOSIS — R399 Unspecified symptoms and signs involving the genitourinary system: Secondary | ICD-10-CM | POA: Diagnosis not present

## 2020-07-12 ENCOUNTER — Ambulatory Visit: Payer: Medicare Other | Admitting: Psychiatry

## 2020-07-12 ENCOUNTER — Ambulatory Visit: Payer: Medicare Other | Admitting: Infectious Diseases

## 2020-07-12 ENCOUNTER — Other Ambulatory Visit: Payer: Self-pay | Admitting: Infectious Diseases

## 2020-07-12 DIAGNOSIS — B379 Candidiasis, unspecified: Secondary | ICD-10-CM

## 2020-07-12 NOTE — Telephone Encounter (Signed)
Refill held. Patient has upcoming appointment today.

## 2020-07-14 ENCOUNTER — Ambulatory Visit: Payer: Medicare Other | Admitting: Sports Medicine

## 2020-07-21 ENCOUNTER — Ambulatory Visit: Payer: Medicare Other | Admitting: Psychiatry

## 2020-07-22 DIAGNOSIS — R399 Unspecified symptoms and signs involving the genitourinary system: Secondary | ICD-10-CM | POA: Diagnosis not present

## 2020-07-25 DIAGNOSIS — M533 Sacrococcygeal disorders, not elsewhere classified: Secondary | ICD-10-CM | POA: Diagnosis not present

## 2020-07-28 ENCOUNTER — Ambulatory Visit: Payer: Medicare Other | Admitting: Infectious Diseases

## 2020-08-04 ENCOUNTER — Ambulatory Visit (INDEPENDENT_AMBULATORY_CARE_PROVIDER_SITE_OTHER): Payer: Medicare Other | Admitting: Infectious Diseases

## 2020-08-04 ENCOUNTER — Other Ambulatory Visit: Payer: Self-pay

## 2020-08-04 ENCOUNTER — Telehealth: Payer: Self-pay

## 2020-08-04 ENCOUNTER — Encounter: Payer: Self-pay | Admitting: Infectious Diseases

## 2020-08-04 DIAGNOSIS — N3090 Cystitis, unspecified without hematuria: Secondary | ICD-10-CM | POA: Diagnosis not present

## 2020-08-04 DIAGNOSIS — B379 Candidiasis, unspecified: Secondary | ICD-10-CM | POA: Diagnosis not present

## 2020-08-04 DIAGNOSIS — I10 Essential (primary) hypertension: Secondary | ICD-10-CM

## 2020-08-04 MED ORDER — FOSFOMYCIN TROMETHAMINE 3 G PO PACK: 3.0000 g | PACK | Freq: Once | ORAL | 0 refills | Status: DC

## 2020-08-04 MED ORDER — NYSTATIN 100000 UNIT/GM EX CREA: TOPICAL_CREAM | Freq: Two times a day (BID) | CUTANEOUS | 3 refills | Status: AC

## 2020-08-04 MED ORDER — FLUCONAZOLE 100 MG PO TABS
100.0000 mg | ORAL_TABLET | Freq: Every day | ORAL | 3 refills | Status: DC
Start: 2020-08-04 — End: 2020-12-27

## 2020-08-04 NOTE — Progress Notes (Signed)
Subjective:    Patient ID: Jennifer [name], female    DOB: January 17, 1946, 75 y.o.   MRN: [id number]  HPI 74yo F with hxHTN,fibromyalgia, gerd, possible IBS, [facility] She had 12 months of bladder infection afterwards. 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 She self cath's and has had difficulty with boiling her cath's.  Previouslyhas been on macrobid, kelfex (abd rash/fungus), IV gent (01-2015), augmentin.   UCx (03-14-15) E coli- ESBL, (S- augmentin, zosyn, imipenem, gent/tobra, nitro). She was seen in ID 03-2015 and was given prn rx for augmentin as well as education on UTI and colonization.  UCx 11-7 E coli (> 100k) S- unasyn/augmentin, amikacin/gent, Ceftaz/Cefepime/cefoxitin, imi/mero/ertapenem, bactrim, zosyn, nitro. UCx11-2018 for UTI. UA (-) for LE and Nitr. + bacteria. She was given fosfomycin.    Her cx grew E coli- ESBL.  s- nitro, augmentin, gent, imipenem.     UCx 05-2017 E coli (same sensi). UCx 12-2018 E coli UCx 10-2019 K pneumo UCx 10-2019 E coli (ESBL)  Has uro f/u October 2021and has been noted to have elevated Cr over the summer (0.8 --> 1.2).She had renal u/s 12-14-19 which was normal.   She had lumbar nerve ablation 03-10-20. She has had hip injections for pain but her SI joint injections were held up due to UTI. She has f/u 07-06-20.  She had elmiron injection with Dr Logan Bores 05-18-20. She was noted to have exacerbation of her UTI after that and had repeat course of augmentin 500mg  tid for 7 days sent 2-23 from urology.  With this she has gi upset- nausea, queasy.   She has f/u with urology 07-18-20.  She has also had mental health follow up as well.    She had a UCx+. And was given augmentin by uro 05-2020. She also had to take zofran.  She was seen in ID 06-16-20. She was offered monurol but deferred.   Today she has concerns she was seen in Uro 4-22 and had  UCx Escherichia coli ESBL Organism ID MICRO MIC SUSCEPTIBILITY   Escherichia coli ESBL Amoxicillin + K Clavulanate MICRO MIC SUSCEPTIBILITY <=8/4: Susceptible  Escherichia coli ESBL Ampicillin MICRO MIC SUSCEPTIBILITY >16: Resistant  Escherichia coli ESBL Ampicillin/Sulbactam MICRO MIC SUSCEPTIBILITY <=4/2: Susceptible  Escherichia coli ESBL Aztreonam MICRO MIC SUSCEPTIBILITY 8: Intermediate  Escherichia coli ESBL Cefazolin MICRO MIC SUSCEPTIBILITY >16: Resistant  Escherichia coli ESBL Cefepime MICRO MIC SUSCEPTIBILITY 4: Susceptible - Dose Dependent  Escherichia coli ESBL Cefotaxime MICRO MIC SUSCEPTIBILITY >32: Resistant  Escherichia coli ESBL Ceftriaxone MICRO MIC SUSCEPTIBILITY >32: Resistant  Escherichia coli ESBL Cefuroxime MICRO MIC SUSCEPTIBILITY >16: Resistant  Escherichia coli ESBL Ciprofloxacin MICRO MIC SUSCEPTIBILITY >2: Resistant  Escherichia coli ESBL Ertapenem MICRO MIC SUSCEPTIBILITY <=0.5: Susceptible  Escherichia coli ESBL Gentamicin MICRO MIC SUSCEPTIBILITY <=2: Susceptible  Escherichia coli ESBL Meropenem MICRO MIC SUSCEPTIBILITY <=1: Susceptible  Escherichia coli ESBL Nitrofurantoin MICRO MIC SUSCEPTIBILITY <=32: Susceptible  Escherichia coli ESBL Piperacillin/Tazobactam MICRO MIC SUSCEPTIBILITY <=8: Susceptible  Escherichia coli ESBL Tetracycline MICRO MIC SUSCEPTIBILITY >8: Resistant  Escherichia coli ESBL Trimethoprim/Sulfamethoxazole MICRO MIC SUSCEPTIBILITY >2/38: Resistant   She had injections in her back last doen 4-25. She has continued pain.   She has had less urine freq, but persistent burning.  Odor- none Still self cath's.  Thinks her candidiasis is better in one area, worse in another. She has refilled her flucon.     Review of Systems  Constitutional: Positive for chills (at night). Negative  for fever.  Genitourinary: Positive for dysuria, frequency and urgency. Negative for hematuria.  Musculoskeletal: Positive for back pain.  Neurological:  Negative for headaches.  Please see HPI. All other systems reviewed and negative.     Objective:   Physical Exam Vitals reviewed.  Constitutional:      Appearance: Normal appearance. She is obese.  HENT:     Mouth/Throat:     Mouth: Mucous membranes are moist.     Pharynx: No oropharyngeal exudate.  Cardiovascular:     Rate and Rhythm: Normal rate and regular rhythm.  Pulmonary:     Effort: Pulmonary effort is normal.     Breath sounds: Normal breath sounds.  Abdominal:     General: Bowel sounds are normal.     Palpations: Abdomen is soft.     Tenderness: There is no right CVA tenderness or left CVA tenderness.  Musculoskeletal:     Right lower leg: No edema.     Left lower leg: No edema.  Neurological:     General: No focal deficit present.     Mental Status: She is alert.           Assessment & Plan:

## 2020-08-04 NOTE — Assessment & Plan Note (Signed)
She is going to f/u with her Deboraha Sprang MD and their pharmacologist.

## 2020-08-04 NOTE — Assessment & Plan Note (Signed)
I re-iterated to her that we are treating her sx.  She would like to try the monurol as the augmentin gave her gi upset.  I remided her that this will not "cure" her.  As well, we dicssussed IM options. These would be more complicated and  I would like to avoid as long as we have po options.  Will see her back in 1 month

## 2020-08-04 NOTE — Assessment & Plan Note (Signed)
Will refill her rx's

## 2020-08-04 NOTE — Telephone Encounter (Signed)
Patient provided information for clinical pharmacist who will be working with her and following her care thru [facility].  Clinical Pharmacist  Stephannie Li [phone]

## 2020-08-05 ENCOUNTER — Telehealth: Payer: Self-pay

## 2020-08-05 NOTE — Telephone Encounter (Signed)
Patient called stating that she went to fill her fosfomycin however the pharmacy team at [facility] was only going to give her 1 packet. Patient reports that Dr. Ninetta Lights instructed her to take 1 packet wait a few days and then take the second dose. Prescription in chart is only for 1 dose. Patient requested clarification. Advised her that Dr. Ninetta Lights is not in the office but that RN would follow up once she hears back.   She also asked when she should get her urine checked again. Instructed patient that unless she's having symptoms, she doesn't need to get a urine culture. Patient requested clarification as she's currently having symptoms. RN instructed that after she takes her antibiotics if she's not having symptoms there is no need for a urine culture as there will likely be bacteria noted in her specimen. She asked if there was a certain amount of days after symptom onset before getting tested. RN said no that if she starts developing symptoms then a urine culture can be ordered.   Forwarding to provider for prescription clarification.  Jennifer Baltazar Loyola Mast, RN

## 2020-08-09 ENCOUNTER — Other Ambulatory Visit: Payer: Self-pay

## 2020-08-09 ENCOUNTER — Ambulatory Visit (INDEPENDENT_AMBULATORY_CARE_PROVIDER_SITE_OTHER): Payer: Medicare Other | Admitting: Psychiatry

## 2020-08-09 ENCOUNTER — Other Ambulatory Visit: Payer: Self-pay | Admitting: Infectious Diseases

## 2020-08-09 DIAGNOSIS — R5382 Chronic fatigue, unspecified: Secondary | ICD-10-CM | POA: Diagnosis not present

## 2020-08-09 DIAGNOSIS — Z638 Other specified problems related to primary support group: Secondary | ICD-10-CM | POA: Diagnosis not present

## 2020-08-09 DIAGNOSIS — N3090 Cystitis, unspecified without hematuria: Secondary | ICD-10-CM

## 2020-08-09 DIAGNOSIS — F411 Generalized anxiety disorder: Secondary | ICD-10-CM

## 2020-08-09 DIAGNOSIS — F4321 Adjustment disorder with depressed mood: Secondary | ICD-10-CM

## 2020-08-09 DIAGNOSIS — M797 Fibromyalgia: Secondary | ICD-10-CM

## 2020-08-09 DIAGNOSIS — M1711 Unilateral primary osteoarthritis, right knee: Secondary | ICD-10-CM

## 2020-08-09 DIAGNOSIS — M48061 Spinal stenosis, lumbar region without neurogenic claudication: Secondary | ICD-10-CM

## 2020-08-09 MED ORDER — FOSFOMYCIN TROMETHAMINE 3 G PO PACK: 3.0000 g | PACK | Freq: Once | ORAL | 0 refills | Status: DC

## 2020-08-09 NOTE — Progress Notes (Signed)
Psychotherapy Progress Note [facility] Marliss Czar, PhD LP  Patient ID: Jennifer [name]     MRN: [id number] Therapy format: Individual psychotherapy Date: 08/09/2020      Start: 4:25p     Stop: 5:15p     Time Spent: 50 min Location: In-person   Session narrative (presenting needs, interim history, self-report of stressors and symptoms, applications of prior therapy, status changes, and interventions made in session) Has not been able to schedule in some time, given medical treatments for back and bladder (esp. re. the interplay of infectious disease and back injections).  Does feel she has coped well enough, applies the idea that she in in control of her reactions, not her circumstances.    Fielded a call from [place], and he surprised her with an exceedingly rare, 2 pages handwritten note for Easter that was more revealing than usual, though not appreciably more intimate.  Makes her think of his absence and standoffishness during mother's decline and death.  It is 7 years now without having pulled off a memorial service or picked up her ashes from funeral home.  Wants to write a letter, maybe invite [name] to a meeting of minds, still inclined after several years to make it some form of family therapy.  Granted willingness to participate, though it seems entirely unlikely to happen.  Discussed concerns about memorializing mother and whether she is obligated to invite brother.  Assured otherwise, that if memory serves, [name] held his own memorial where they live near the time, and so he will not be counting on, nor offended by being left out of, something she might put on here at this late date.  Struggling with moral obligation to forgive -- reframed, working out a simpler version of what honors her mother and releases resentment toward [place].  Therapeutic modalities: Cognitive Behavioral Therapy, Solution-Oriented/Positive Psychology, Ego-Supportive, and Faith-sensitive  Mental  Status/Observations:  Appearance:   Casual and Neat     Behavior:  Appropriate  Motor:  Normal and affected by knee pain  Speech/Language:   Clear and Coherent  Affect:  Appropriate  Mood:  dysthymic  Thought process:  normal  Thought content:    WNL and Rumination  Sensory/Perceptual disturbances:    WNL  Orientation:  Fully oriented  Attention:  Good    Concentration:  Good  Memory:  WNL  Insight:    Good  Judgment:   Fair  Impulse Control:  Good   Risk Assessment: Danger to Self: No Self-injurious Behavior: No Danger to Others: No Physical Aggression / Violence: No Duty to Warn: No Access to Firearms a concern: No  Assessment of progress:  stabilized  Diagnosis:   ICD-10-CM   1. Generalized anxiety disorder  F41.1     2. Primary osteoarthritis of right knee  M17.11     3. Spinal stenosis, lumbar region, without neurogenic claudication  M48.061     4. Chronic fatigue syndrome with fibromyalgia  R53.82    M79.7     5. Recurrent bacterial cystitis  N30.90     6. 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      Plan:  Follow through appropriate care for orthopedic pain and fatigue problems Endorse holding her own memorial, encourage contacting funeral home to see if ashes are still stored or were classified abandoned Self-affirm reframed definition of forgiveness, practice noticing and reducing expectations of brother's behavior in all respects Other recommendations/advice as may  be noted above Continue to utilize previously learned skills ad lib Maintain medication as prescribed and work faithfully with relevant prescriber(s) if any changes are desired or seem indicated Call the clinic on-call service, present to ER, or call 911 if any life-threatening psychiatric crisis Return for time at discretion. Already scheduled visit in this office 08/25/2020.  Robley Fries, PhD Marliss Czar, PhD LP Clinical Psychologist,  [facility] [address], [address] [place], [place] [address] [phone]

## 2020-08-10 ENCOUNTER — Other Ambulatory Visit: Payer: Self-pay

## 2020-08-10 DIAGNOSIS — N3090 Cystitis, unspecified without hematuria: Secondary | ICD-10-CM

## 2020-08-10 MED ORDER — FOSFOMYCIN TROMETHAMINE 3 G PO PACK: 3.0000 g | PACK | Freq: Once | ORAL | 0 refills | Status: AC

## 2020-08-10 NOTE — Telephone Encounter (Signed)
Patient called to follow up on fosfomycin prescription. Dr. Ninetta Lights sent in refill on 08/09/20. Prescription was sent as "print," pharmacy did not receive. [facility] called [facility] [address] to phone in prescription per Dr. Ninetta Lights. Patient made aware she should be able to pick up later today. Patient verbalized understanding and has no further questions.   Sandie Ano, RN

## 2020-08-17 ENCOUNTER — Other Ambulatory Visit: Payer: Self-pay

## 2020-08-17 DIAGNOSIS — R198 Other specified symptoms and signs involving the digestive system and abdomen: Secondary | ICD-10-CM | POA: Insufficient documentation

## 2020-08-17 DIAGNOSIS — E669 Obesity, unspecified: Secondary | ICD-10-CM | POA: Insufficient documentation

## 2020-08-17 DIAGNOSIS — R1030 Lower abdominal pain, unspecified: Secondary | ICD-10-CM | POA: Insufficient documentation

## 2020-08-17 DIAGNOSIS — R110 Nausea: Secondary | ICD-10-CM | POA: Insufficient documentation

## 2020-08-17 DIAGNOSIS — R42 Dizziness and giddiness: Secondary | ICD-10-CM | POA: Insufficient documentation

## 2020-08-17 DIAGNOSIS — E559 Vitamin D deficiency, unspecified: Secondary | ICD-10-CM | POA: Insufficient documentation

## 2020-08-17 DIAGNOSIS — E65 Localized adiposity: Secondary | ICD-10-CM | POA: Insufficient documentation

## 2020-08-17 DIAGNOSIS — M419 Scoliosis, unspecified: Secondary | ICD-10-CM | POA: Insufficient documentation

## 2020-08-17 DIAGNOSIS — J309 Allergic rhinitis, unspecified: Secondary | ICD-10-CM | POA: Insufficient documentation

## 2020-08-17 DIAGNOSIS — Z6835 Body mass index (BMI) 35.0-35.9, adult: Secondary | ICD-10-CM | POA: Insufficient documentation

## 2020-08-17 DIAGNOSIS — Z1211 Encounter for screening for malignant neoplasm of colon: Secondary | ICD-10-CM | POA: Insufficient documentation

## 2020-08-17 DIAGNOSIS — R5382 Chronic fatigue, unspecified: Secondary | ICD-10-CM | POA: Insufficient documentation

## 2020-08-17 DIAGNOSIS — E7800 Pure hypercholesterolemia, unspecified: Secondary | ICD-10-CM | POA: Insufficient documentation

## 2020-08-17 DIAGNOSIS — R143 Flatulence: Secondary | ICD-10-CM | POA: Insufficient documentation

## 2020-08-17 DIAGNOSIS — G44309 Post-traumatic headache, unspecified, not intractable: Secondary | ICD-10-CM | POA: Insufficient documentation

## 2020-08-17 DIAGNOSIS — R899 Unspecified abnormal finding in specimens from other organs, systems and tissues: Secondary | ICD-10-CM | POA: Insufficient documentation

## 2020-08-17 DIAGNOSIS — Z79899 Other long term (current) drug therapy: Secondary | ICD-10-CM | POA: Insufficient documentation

## 2020-08-17 DIAGNOSIS — K591 Functional diarrhea: Secondary | ICD-10-CM | POA: Insufficient documentation

## 2020-08-17 DIAGNOSIS — F419 Anxiety disorder, unspecified: Secondary | ICD-10-CM | POA: Insufficient documentation

## 2020-08-17 DIAGNOSIS — E039 Hypothyroidism, unspecified: Secondary | ICD-10-CM | POA: Insufficient documentation

## 2020-08-17 DIAGNOSIS — G4730 Sleep apnea, unspecified: Secondary | ICD-10-CM | POA: Insufficient documentation

## 2020-08-17 DIAGNOSIS — G9332 Myalgic encephalomyelitis/chronic fatigue syndrome: Secondary | ICD-10-CM | POA: Insufficient documentation

## 2020-08-18 ENCOUNTER — Ambulatory Visit (INDEPENDENT_AMBULATORY_CARE_PROVIDER_SITE_OTHER): Payer: Medicare Other | Admitting: Sports Medicine

## 2020-08-18 ENCOUNTER — Encounter: Payer: Self-pay | Admitting: Sports Medicine

## 2020-08-18 ENCOUNTER — Other Ambulatory Visit: Payer: Self-pay

## 2020-08-18 DIAGNOSIS — M79674 Pain in right toe(s): Secondary | ICD-10-CM

## 2020-08-18 DIAGNOSIS — L608 Other nail disorders: Secondary | ICD-10-CM

## 2020-08-18 DIAGNOSIS — B351 Tinea unguium: Secondary | ICD-10-CM

## 2020-08-18 DIAGNOSIS — M79675 Pain in left toe(s): Secondary | ICD-10-CM | POA: Diagnosis not present

## 2020-08-18 NOTE — Progress Notes (Signed)
Subjective: Jennifer [name] is a 75 y.o. female patient seen today for a nail check.  Patient reports that she noticed after her second pedicure when the pedicurist remove the nail polish she had a little streak of possible dried blood underneath the nail.  Patient states that she does not recall any obvious or known trauma or injury but reports that it could be possible that she could have bumped it at some point that could have added to this issue.  Patient also reports that sometimes her left great toenail gets very thick and a little sore on the corner and the pedicurist trims it out and give toe relief and has questions as to why her nail is growing this way.  Patient denies any other pedal complaints at this time or any constitutional symptoms.  Patient apologizes for a rescheduled multiple appointments.  Patient reports that she has lumbar stenosis and has significant back trouble and that affects her mobility.  Review of systems noncontributory.  Patient Active Problem List   Diagnosis Date Noted  . Allergic rhinitis 08/17/2020  . Altered bowel function 08/17/2020  . Anxiety disorder 08/17/2020  . Body mass index (BMI) 35.0-35.9, adult 08/17/2020  . Chronic fatigue syndrome 08/17/2020  . Colon cancer screening 08/17/2020  . Functional diarrhea 08/17/2020  . Hypothyroidism 08/17/2020  . Intestinal gas excretion 08/17/2020  . Localized obesity 08/17/2020  . Nausea 08/17/2020  . Polypharmacy 08/17/2020  . Posttraumatic headache 08/17/2020  . Posttraumatic vertigo 08/17/2020  . Pure hypercholesterolemia 08/17/2020  . Lower abdominal pain 08/17/2020  . Scoliosis 08/17/2020  . Sleep apnea 08/17/2020  . Unspecified abnormal finding in specimens from other organs, systems and tissues 08/17/2020  . Vitamin D deficiency 08/17/2020  . Back pain 04/28/2020  . Candidiasis 10/15/2019  . Encounter for orthopedic follow-up care 08/12/2018  . Vertigo 02/26/2018  . Degenerative arthritis  of right knee 02/19/2018  . Urinary tract infection, site not specified 07/04/2017  . Arthralgia of both knees 01/23/2017  . Facet arthritis, degenerative, lumbar spine 01/23/2017  . Benign paroxysmal positional vertigo 07/03/2016  . IBS (irritable bowel syndrome) 04/19/2015  . GERD (gastroesophageal reflux disease) 03/21/2015  . Fibromyalgia 03/21/2015  . Recurrent bacterial cystitis 03/21/2015  . HTN (hypertension) 03/21/2015  . Fibrositis 03/21/2015  . SI (sacroiliac) pain 12/16/2014  . DDD (degenerative disc disease), lumbar 05/11/2014  . Facet arthropathy 05/11/2014  . Spinal stenosis of lumbar region 05/11/2014    Current Outpatient Medications on File Prior to Visit  Medication Sig Dispense Refill  . amoxicillin-clavulanate (AUGMENTIN) 500-125 MG tablet Take 1 tablet by mouth 3 (three) times daily.    Marland Kitchen amoxicillin-clavulanate (AUGMENTIN) 875-125 MG tablet Take 1 tablet by mouth 2 (two) times daily.    Marland Kitchen augmented betamethasone dipropionate (DIPROLENE-AF) 0.05 % cream 1 application    . cephALEXin (KEFLEX) 250 MG capsule Take by mouth.    Marland Kitchen COVID-19 mRNA vaccine, Moderna, 100 MCG/0.5ML injection INJECT AS DIRECTED .25 mL 0  . D-Mannose 500 MG CAPS Take by mouth.    . DENTA 5000 PLUS 1.1 % CREA dental cream     . dextromethorphan-guaiFENesin (MUCINEX DM) 30-600 MG 12hr tablet Take 1 tablet by mouth 2 (two) times daily as needed for cough.    . diazepam (VALIUM) 5 MG tablet Take 1 tab PO 20 minutes prior to procedure.    . fexofenadine (ALLEGRA) 60 MG tablet Take 60 mg by mouth daily. Reported on 05/03/2015    . fluconazole (DIFLUCAN) 100 MG tablet Take  1 tablet (100 mg total) by mouth daily. 10 tablet 3  . Fluoxetine HCl, PMDD, 10 MG TABS Take by mouth.    . fluticasone (FLONASE) 50 MCG/ACT nasal spray USE 2 SPRAYS INTO EACH NOSTRIL AT NIGHT    . fosfomycin (MONUROL) 3 g PACK Take by mouth.    Marland Kitchen HYDROcodone-acetaminophen (NORCO) 7.5-325 MG tablet Take 1 tablet by mouth every 8  (eight) hours as needed.     . hydrocortisone (ANUSOL-HC) 25 MG suppository UNWRAP AND INSERT 1 SUPPOSITORY RECTALLY TWICE A DAY AS NEEDED    . ipratropium (ATROVENT) 0.06 % nasal spray USE 2 SPRAYS IN EACH NOSTRIL TWICE A DAY AS NEEDED FOR DRAINAGE    . losartan (COZAAR) 50 MG tablet Take 100 mg by mouth daily.   0  . meclizine (ANTIVERT) 25 MG tablet Take 25 mg by mouth 3 (three) times daily as needed.     . Meth-Hyo-M Bl-Benz Acd-Ph Sal 81.6 MG TABS Take 1 tablet (81.6 mg total) by mouth 2 (two) times daily as needed. 30 each 1  . Methen-Hyosc-Meth Blue-Na Phos (UROGESIC-BLUE) 81.6 MG TABS TAKE 1 TABLET BY MOUTH EVERY 6 HOURS AS NEEDED BURNING    . methenamine (HIPREX) 1 G tablet Take by mouth as directed. TAKES WHEN NOT TAKING ANTIBIOTICS    . methocarbamol (ROBAXIN) 500 MG tablet Take 1 tablet by mouth 3 (three) times daily as needed.    . nebivolol (BYSTOLIC) 10 MG tablet Take 5 mg by mouth every morning.     . nystatin cream (MYCOSTATIN) Apply topically 2 (two) times daily. 30 g 3  . omeprazole (PRILOSEC) 20 MG capsule 1 capsule ONE TO TWO TIMES DAILY    . ondansetron (ZOFRAN-ODT) 4 MG disintegrating tablet Take 1 tablet by mouth every 8 (eight) hours as needed.  1  . pentosan polysulfate (ELMIRON) 100 MG capsule 100 mg. WEEKLY PRN BLADDER INSTILLATION AT DR Patsi Sears OFFICE    . Probiotic Product (PROBIOTIC DAILY) CAPS 1 capsule    . promethazine (PHENERGAN) 25 MG tablet 1 tablet every six (6) hours as needed.    . sucralfate (CARAFATE) 1 G tablet Take 1 g by mouth 2 (two) times daily.     Marland Kitchen triamcinolone cream (KENALOG) 0.1 % BID PRN FOR ECZEMA  0   No current facility-administered medications on file prior to visit.    Allergies  Allergen Reactions  . Avelox [Moxifloxacin] Hives, Shortness Of Breath and Swelling  . Quinolones Anaphylaxis  . Sulfa Antibiotics Hives  . Asa [Aspirin] Diarrhea and Other (See Comments)    GI upset  . Dmso [Dimethyl Sulfoxide] Itching  . Duloxetine  Hcl     Other reaction(s): vertigo and heart racing and nausea  . Hydrochlorothiazide     Other reaction(s): hadache  . Ibuprofen Other (See Comments)    GI upset  . Nitrofurantoin Diarrhea    Gi upset  . Topiramate     Other reaction(s): itching  . Adhesive [Tape] Rash  . Codeine Hives  . Macrodantin [Nitrofurantoin Macrocrystal] Nausea Only and Rash    Abdominal and chest pain. Itching.  . Yellow Dye Rash    Objective: Physical Exam  General: Well developed, nourished, no acute distress, awake, alert and oriented x 3  Vascular: Dorsalis pedis artery 2/4 bilateral, Posterior tibial artery 1/4 bilateral, skin temperature warm to warm proximal to distal bilateral lower extremities, no varicosities, pedal hair present bilateral.  Neurological: Gross sensation present via light touch bilateral.   Dermatological: Skin is warm,  dry, and supple bilateral, left greater than right hallux nail severely thickened pincer deformity with subungual debris.  There is a small streak of dried blood noted in the middle of the nailbed that appears to be stable with no loosening of the nail or signs of infection., no callus/corns/hyperkeratotic tissue present bilateral. No signs of infection bilateral.  Musculoskeletal: Asymptomatic hammertoe boney deformities noted bilateral. Muscular strength within normal limits without painon range of motion. No pain with calf compression bilateral.  Assessment and Plan:  Problem List Items Addressed This Visit   None   Visit Diagnoses    Hemorrhage of nail    -  Primary   Pincer nail deformity       Onychomycosis       Toe pain, bilateral          -Examined patient.  -Discussed treatment options for nails -Advised patient at this time since the blood in the right hallux nail appears to be stable recommend monitoring and advised her that slowly the blood will grow out -Advised patient to continue with her pedicures that she gets to help with keeping the  corners of her left great toenail trimmed back and to help to control the pincer deformity as best as possible -Recommend Vicks VapoRub as needed to the left great toenail especially since it is severely thickened -Patient to return as needed or sooner if problems or issues arise.  Asencion Islam, DPM

## 2020-08-25 ENCOUNTER — Ambulatory Visit: Payer: Medicare Other | Admitting: Psychiatry

## 2020-08-26 DIAGNOSIS — M7918 Myalgia, other site: Secondary | ICD-10-CM | POA: Diagnosis not present

## 2020-08-26 DIAGNOSIS — M48062 Spinal stenosis, lumbar region with neurogenic claudication: Secondary | ICD-10-CM | POA: Diagnosis not present

## 2020-08-26 DIAGNOSIS — G894 Chronic pain syndrome: Secondary | ICD-10-CM | POA: Diagnosis not present

## 2020-08-26 DIAGNOSIS — M47816 Spondylosis without myelopathy or radiculopathy, lumbar region: Secondary | ICD-10-CM | POA: Diagnosis not present

## 2020-09-02 DIAGNOSIS — R051 Acute cough: Secondary | ICD-10-CM | POA: Diagnosis not present

## 2020-09-02 DIAGNOSIS — Z20822 Contact with and (suspected) exposure to covid-19: Secondary | ICD-10-CM | POA: Diagnosis not present

## 2020-09-02 DIAGNOSIS — R197 Diarrhea, unspecified: Secondary | ICD-10-CM | POA: Diagnosis not present

## 2020-09-02 DIAGNOSIS — B349 Viral infection, unspecified: Secondary | ICD-10-CM | POA: Diagnosis not present

## 2020-09-02 DIAGNOSIS — R399 Unspecified symptoms and signs involving the genitourinary system: Secondary | ICD-10-CM | POA: Diagnosis not present

## 2020-09-08 ENCOUNTER — Encounter: Payer: Self-pay | Admitting: Infectious Diseases

## 2020-09-08 ENCOUNTER — Ambulatory Visit (HOSPITAL_COMMUNITY)
Admission: EM | Admit: 2020-09-08 | Discharge: 2020-09-08 | Disposition: A | Discharge status: Home / Self Care | Payer: Medicare Other | Attending: Student | Admitting: Student

## 2020-09-08 ENCOUNTER — Ambulatory Visit (INDEPENDENT_AMBULATORY_CARE_PROVIDER_SITE_OTHER): Payer: Medicare Other | Admitting: Infectious Diseases

## 2020-09-08 ENCOUNTER — Encounter (HOSPITAL_COMMUNITY): Payer: Self-pay

## 2020-09-08 ENCOUNTER — Other Ambulatory Visit: Payer: Self-pay

## 2020-09-08 ENCOUNTER — Emergency Department (HOSPITAL_COMMUNITY)
Admission: EM | Admit: 2020-09-08 | Discharge: 2020-09-08 | Disposition: A | Discharge status: Home / Self Care | Payer: Medicare Other | Attending: Emergency Medicine | Admitting: Emergency Medicine

## 2020-09-08 ENCOUNTER — Emergency Department (HOSPITAL_COMMUNITY): Payer: Medicare Other

## 2020-09-08 DIAGNOSIS — R079 Chest pain, unspecified: Secondary | ICD-10-CM | POA: Diagnosis not present

## 2020-09-08 DIAGNOSIS — R9082 White matter disease, unspecified: Secondary | ICD-10-CM | POA: Diagnosis not present

## 2020-09-08 DIAGNOSIS — Z79899 Other long term (current) drug therapy: Secondary | ICD-10-CM | POA: Insufficient documentation

## 2020-09-08 DIAGNOSIS — R519 Headache, unspecified: Secondary | ICD-10-CM | POA: Diagnosis present

## 2020-09-08 DIAGNOSIS — N3090 Cystitis, unspecified without hematuria: Secondary | ICD-10-CM

## 2020-09-08 DIAGNOSIS — I10 Essential (primary) hypertension: Secondary | ICD-10-CM | POA: Diagnosis not present

## 2020-09-08 DIAGNOSIS — G319 Degenerative disease of nervous system, unspecified: Secondary | ICD-10-CM | POA: Diagnosis not present

## 2020-09-08 DIAGNOSIS — I161 Hypertensive emergency: Secondary | ICD-10-CM | POA: Diagnosis not present

## 2020-09-08 DIAGNOSIS — I160 Hypertensive urgency: Secondary | ICD-10-CM

## 2020-09-08 DIAGNOSIS — R011 Cardiac murmur, unspecified: Secondary | ICD-10-CM

## 2020-09-08 LAB — TROPONIN I (HIGH SENSITIVITY)
Troponin I (High Sensitivity): 15 ng/L (ref <18)
Troponin I (High Sensitivity): 11 ng/L (ref <18)

## 2020-09-08 LAB — COMPREHENSIVE METABOLIC PANEL
ALT: 17 U/L (ref 0–44)
AST: 19 U/L (ref 15–41)
Albumin: 3.7 g/dL (ref 3.5–5.0)
Alkaline Phosphatase: 62 U/L (ref 38–126)
Anion gap: 11 (ref 5–15)
BUN: 11 mg/dL (ref 8–23)
CO2: 22 mmol/L (ref 22–32)
Calcium: 8.8 mg/dL — ABNORMAL LOW (ref 8.9–10.3)
Chloride: 105 mmol/L (ref 98–111)
Creatinine, Ser: 0.78 mg/dL (ref 0.44–1.00)
GFR, Estimated: >60 mL/min (ref >60)
Glucose, Bld: 102 mg/dL — ABNORMAL HIGH (ref 70–99)
Potassium: 3.6 mmol/L (ref 3.5–5.1)
Sodium: 138 mmol/L (ref 135–145)
Total Bilirubin: 0.9 mg/dL (ref 0.3–1.2)
Total Protein: 6.4 g/dL — ABNORMAL LOW (ref 6.5–8.1)

## 2020-09-08 MED ORDER — LABETALOL HCL 5 MG/ML IV SOLN
20.0000 mg | Freq: Once | INTRAVENOUS | Status: AC
  Administered 2020-09-08: 20 mg via INTRAVENOUS
  Filled 2020-09-08: qty 4

## 2020-09-08 MED ORDER — ACETAMINOPHEN 325 MG PO TABS
650.0000 mg | ORAL_TABLET | Freq: Once | ORAL | Status: AC
  Administered 2020-09-08: 650 mg via ORAL
  Filled 2020-09-08: qty 2

## 2020-09-08 NOTE — Discharge Instructions (Addendum)
Please call and follow-up closely with your primary care provider for further managements of your elevated blood pressure.  Return if you have any concern.

## 2020-09-08 NOTE — ED Triage Notes (Signed)
Pt reports her blood pressure was 222/97 at the Dentist office today. Pt reports burring sensation left side of the chest , labor breathing x 1 day; headache started today. Denies dizziness, vision changes, weakness in the arms/legs, abdominal pain, diarrhea.

## 2020-09-08 NOTE — ED Triage Notes (Signed)
Pt arrived via ems from urgent care. EMS reports pts bp was 260/110. Pt has a hx of hypertension and has been taking her medications. Pt has no other complaints a this time.

## 2020-09-08 NOTE — ED Notes (Signed)
EMS called, cardiac monitor and emergent transport requested.   Reported to charge nurse, pt will go by EMS to the ED.

## 2020-09-08 NOTE — ED Notes (Signed)
Patient transported to CT 

## 2020-09-08 NOTE — ED Provider Notes (Addendum)
[facility]    CSN: [id number] Arrival date & time: 09/08/20  1754      History   Chief Complaint Chief Complaint  Patient presents with   Chest Pain   Hypertension    HPI ELAN MCELVAIN is a 75 y.o. female presenting with chest pain, shortness of breath, headaches for about 2 days.  Medical history hiatal hernia, hypertension, chronic cystitis, fibromyalgia, chronic fatigue syndrome, wears glasses.  Notes about 2 days of intermittent left-sided burning chest pain without radiation.  Also endorses labored breathing with exertion, states this does not feel like shortness of breath.  Headaches intermittently throughout the day, throbbing behind the forehead.  Denies vision changes.  Denies weakness in arms or legs.  Denies sensation changes in arms or legs.  Denies worst headache of life.  States that she was at her dentist earlier today and they told her to go to urgent care due to her symptoms.  She has taken all of her antihypertensives as directed.  HPI  Past Medical History:  Diagnosis Date   Acute meniscal tear of left knee    FOLLOWED BY DR GIAFFREY   BPPV (benign paroxysmal positional vertigo)    Chronic bladder pain    Chronic fatigue syndrome    Chronic low back pain    W/ RIGHT LEG PAIN AND NUMBNESS   Cystitis, chronic    Fibromyalgia    GERD (gastroesophageal reflux disease)    Hiatal hernia    Hypertension    IC (interstitial cystitis)    OSA (obstructive sleep apnea)    per pt study yrs ago-- moderate osa ,  intolerant cpap   Pinched vertebral nerve    bilateral L2 -- L3 and L3 -- L4-/  epidural injection's treatment , PT and pain clince   PONV (postoperative nausea and vomiting)    severe   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    Self-catheterizes urinary bladder    Spinal stenosis, lumbar region with neurogenic claudication    Wears glasses    Wears partial dentures    upper and lower    Patient Active  Problem List   Diagnosis Date Noted   Heart murmur 09/08/2020   Allergic rhinitis 08/17/2020   Altered bowel function 08/17/2020   Anxiety disorder 08/17/2020   Body mass index (BMI) 35.0-35.9, adult 08/17/2020   Chronic fatigue syndrome 08/17/2020   Colon cancer screening 08/17/2020   Functional diarrhea 08/17/2020   Hypothyroidism 08/17/2020   Intestinal gas excretion 08/17/2020   Localized obesity 08/17/2020   Nausea 08/17/2020   Polypharmacy 08/17/2020   Posttraumatic headache 08/17/2020   Posttraumatic vertigo 08/17/2020   Pure hypercholesterolemia 08/17/2020   Lower abdominal pain 08/17/2020   Scoliosis 08/17/2020   Sleep apnea 08/17/2020   Unspecified abnormal finding in specimens from other organs, systems and tissues 08/17/2020   Vitamin D deficiency 08/17/2020   Back pain 04/28/2020   Candidiasis 10/15/2019   Encounter for orthopedic follow-up care 08/12/2018   Vertigo 02/26/2018   Degenerative arthritis of right knee 02/19/2018   Urinary tract infection, site not specified 07/04/2017   Arthralgia of both knees 01/23/2017   Facet arthritis, degenerative, lumbar spine 01/23/2017   Benign paroxysmal positional vertigo 07/03/2016   IBS (irritable bowel syndrome) 04/19/2015   GERD (gastroesophageal reflux disease) 03/21/2015   Fibromyalgia 03/21/2015   Recurrent bacterial cystitis 03/21/2015   HTN (hypertension) 03/21/2015   Fibrositis 03/21/2015   SI (sacroiliac) pain 12/16/2014  DDD (degenerative disc disease), lumbar 05/11/2014   Facet arthropathy 05/11/2014   Spinal stenosis of lumbar region 05/11/2014    Past Surgical History:  Procedure Laterality Date   ABDOMINAL HYSTERECTOMY  1980   w/ Left Salpingoophorectomy   CARDIAC CATHETERIZATION  08-21-2001  dr Verdis Prime   normal coronary arteries and LVF   CARPAL TUNNEL RELEASE Bilateral 1986   CECALCYSTOPLASTY/  APPENDECTOMY  1984   at [facility]   CHOLECYSTECTOMY  1992   CYSTO WITH HYDRODISTENSION  N/A 01/12/2016   Procedure: CYSTOSCOPY/HYDRODISTENSION;  Surgeon: Jethro Bolus, MD;  Location: [facility];  Service: Urology;  Laterality: N/A;   CYSTO WITH HYDRODISTENSION N/A 08/30/2016   Procedure: CYSTOSCOPY/HYDRODISTENSION OF BLADDER INJECTION OF MARCAINE/PYRIDIUM;  Surgeon: Jethro Bolus, MD;  Location: [facility];  Service: Urology;  Laterality: N/A;   MULTIPLE CYSTO/  HYDRODISTENTION/  INSTILLATION THERAPY  last one 08-19-2009   NASAL SINUS SURGERY  1987   TONSILLECTOMY  as child   VAGINAL GROWTH REMOVED  as teen    OB History   No obstetric history on file.      Home Medications    Prior to Admission medications   Medication Sig Start Date End Date Taking? Authorizing Provider  amoxicillin-clavulanate (AUGMENTIN) 875-125 MG tablet Take 1 tablet by mouth 2 (two) times daily. 07/26/20   [provider]  augmented betamethasone dipropionate (DIPROLENE-AF) 0.05 % cream 1 application 08/12/19   [provider]  COVID-19 mRNA vaccine, Moderna, 100 MCG/0.5ML injection INJECT AS DIRECTED 02/01/20 01/31/21  Judyann Munson, MD  D-Mannose 500 MG CAPS Take by mouth.    [provider]  DENTA 5000 PLUS 1.1 % CREA dental cream  09/16/17   [provider]  dextromethorphan-guaiFENesin (MUCINEX DM) 30-600 MG 12hr tablet Take 1 tablet by mouth 2 (two) times daily as needed for cough.    [provider]  diazepam (VALIUM) 5 MG tablet Take 1 tab PO 20 minutes prior to procedure. 07/03/19   [provider]  fexofenadine (ALLEGRA) 60 MG tablet Take 60 mg by mouth daily. Reported on 05/03/2015 01/06/14   [provider]  fluconazole (DIFLUCAN) 100 MG tablet Take 1 tablet (100 mg total) by mouth daily. 08/04/20   Ginnie Smart, MD  Fluoxetine HCl, PMDD, 10 MG TABS Take by mouth.    [provider]  fluticasone (FLONASE) 50 MCG/ACT nasal spray USE 2 SPRAYS INTO EACH NOSTRIL AT NIGHT 09/16/17    [provider]  fosfomycin (MONUROL) 3 g PACK Take by mouth. 08/10/19   [provider]  HYDROcodone-acetaminophen (NORCO) 7.5-325 MG tablet Take 1 tablet by mouth every 8 (eight) hours as needed.  02/07/15   [provider]  hydrocortisone (ANUSOL-HC) 25 MG suppository UNWRAP AND INSERT 1 SUPPOSITORY RECTALLY TWICE A DAY AS NEEDED 05/26/17   [provider]  ipratropium (ATROVENT) 0.06 % nasal spray USE 2 SPRAYS IN EACH NOSTRIL TWICE A DAY AS NEEDED FOR DRAINAGE    [provider]  losartan (COZAAR) 50 MG tablet Take 100 mg by mouth daily.  08/12/17   [provider]  meclizine (ANTIVERT) 25 MG tablet Take 25 mg by mouth 3 (three) times daily as needed.     [provider]  Meth-Hyo-M Bl-Benz Acd-Ph Sal 81.6 MG TABS Take 1 tablet (81.6 mg total) by mouth 2 (two) times daily as needed. 06/07/17   Ginnie Smart, MD  Methen-Hyosc-Meth Blue-Na Phos (UROGESIC-BLUE) 81.6 MG TABS TAKE 1 TABLET BY MOUTH  EVERY 6 HOURS AS NEEDED BURNING 04/17/19   [provider]  methenamine (HIPREX) 1 G tablet Take by mouth as directed. TAKES WHEN NOT TAKING ANTIBIOTICS    [provider]  methocarbamol (ROBAXIN) 500 MG tablet Take 1 tablet by mouth 3 (three) times daily as needed. 03/10/20   [provider]  nebivolol (BYSTOLIC) 10 MG tablet Take 5 mg by mouth every morning.  01/08/14   [provider]  nystatin cream (MYCOSTATIN) Apply topically 2 (two) times daily. 08/04/20   Ginnie Smart, MD  omeprazole (PRILOSEC) 20 MG capsule 1 capsule ONE TO TWO TIMES DAILY 02/08/14   [provider]  ondansetron (ZOFRAN-ODT) 4 MG disintegrating tablet Take 1 tablet by mouth every 8 (eight) hours as needed. 07/01/17   [provider]  pentosan polysulfate (ELMIRON) 100 MG capsule 100 mg. WEEKLY PRN BLADDER INSTILLATION AT DR [facility] OFFICE    [provider]  Probiotic Product (PROBIOTIC DAILY) CAPS 1 capsule  10/15/17   [provider]  promethazine (PHENERGAN) 25 MG tablet 1 tablet every six (6) hours as needed. 02/08/14   [provider]  sucralfate (CARAFATE) 1 G tablet Take 1 g by mouth 2 (two) times daily.     [provider]  triamcinolone cream (KENALOG) 0.1 % BID PRN FOR ECZEMA 02/03/15   [provider]    Family History Family History  Problem Relation Age of Onset   CAD Father    Asthma Father    Hypertension Father    Alzheimer's disease Father    Emphysema Father    COPD Father    Heart failure Mother    Peripheral vascular disease Mother    Stroke Mother    Hypertension Mother    Colonic polyp Mother    Cancer Brother        melanoma    Social History Social History   Tobacco Use   Smoking status: Never   Smokeless tobacco: Never  Vaping Use   Vaping Use: Never used  Substance Use Topics   Alcohol use: No    Alcohol/week: 0.0 standard drinks   Drug use: No     Allergies   Avelox [moxifloxacin], Quinolones, Sulfa antibiotics, Aspirin, Dimethyl sulfoxide, Duloxetine hcl, Hydrochlorothiazide, Ibuprofen, Nitrofurantoin, Topiramate, Adhesive [tape], Codeine, Macrodantin [nitrofurantoin macrocrystal], and Yellow dye   Review of Systems Review of Systems  Respiratory:  Positive for chest tightness and shortness of breath.   Neurological:  Positive for headaches.  All other systems reviewed and are negative.   Physical Exam Triage Vital Signs ED Triage Vitals [09/08/20 1801]  Enc Vitals Group     BP (!) 246/86     Pulse Rate 69     Resp 18     Temp 98.7 F (37.1 C)     Temp Source Oral     SpO2 99 %     Weight      Height      Head Circumference      Peak Flow      Pain Score      Pain Loc      Pain Edu?      Excl. in GC?    No data found.  Updated Vital Signs BP (!) 246/86 (BP Location: Right Arm)   Pulse 69   Temp 98.7 F (37.1 C) (Oral)   Resp 18   SpO2 99%   Visual Acuity Right Eye Distance:   Left  Eye Distance:   Bilateral Distance:  Right Eye Near:   Left Eye Near:    Bilateral Near:     Physical Exam Vitals reviewed.  Constitutional:      Appearance: Normal appearance. She is not diaphoretic.  HENT:     Head: Normocephalic and atraumatic.     Mouth/Throat:     Mouth: Mucous membranes are moist.  Eyes:     Extraocular Movements: Extraocular movements intact.     Pupils: Pupils are equal, round, and reactive to light.  Cardiovascular:     Rate and Rhythm: Normal rate and regular rhythm.     Pulses:          Radial pulses are 2+ on the right side and 2+ on the left side.     Heart sounds: Normal heart sounds.  Pulmonary:     Effort: Pulmonary effort is normal.     Breath sounds: Normal breath sounds.  Abdominal:     Palpations: Abdomen is soft.     Tenderness: There is no abdominal tenderness. There is no guarding or rebound.  Musculoskeletal:     Right lower leg: No edema.     Left lower leg: No edema.  Skin:    General: Skin is warm.     Capillary Refill: Capillary refill takes less than 2 seconds.  Neurological:     General: No focal deficit present.     Mental Status: She is alert and oriented to person, place, and time.     Comments: CN 2-12 grossly intact. Strength grossly 5/5 in UEs and LEs, sensation intact. Nystagmus at baseline. EOMI, PERRLA. Negative finger to thumb. Gaint intact.   Psychiatric:        Mood and Affect: Mood normal.        Behavior: Behavior normal.        Thought Content: Thought content normal.        Judgment: Judgment normal.     UC Treatments / Results  Labs (all labs ordered are listed, but only abnormal results are displayed) Labs Reviewed - No data to display  EKG   Radiology No results found.  Procedures Procedures (including critical care time)  Medications Ordered in UC Medications - No data to display  Initial Impression / Assessment and Plan / UC Course  I have reviewed the triage vital signs and the  nursing notes.  Pertinent labs & imaging results that were available during my care of the patient were reviewed by me and considered in my medical decision making (see chart for details).     This patient is a 75 year old female presenting with chest pain, shortness of breath, headaches.  She is significantly hypertensive at 246/86.  Taking her antihypertensives as directed.  EKG NSR, unchanged compared with prior EKG 2017.  Discussed that patient requires evaluation in emergency room setting for her symptoms, including full cardiac work-up.  She drove herself, so we are sending her via EMS. Discussed treatment plan with attending physician Dr. Tracie HarrierHagler who is in agreement with this plan.  Final Clinical Impressions(s) / UC Diagnoses   Final diagnoses:  Hypertensive emergency  Chest pain, unspecified type  Essential hypertension   Discharge Instructions   None    ED Prescriptions   None    PDMP not reviewed this encounter.   Rhys MartiniGraham, Elleana Stillson E, PA-C 09/08/20 1831    Rhys MartiniGraham, Bre Pecina E, PA-C 09/08/20 [phone]

## 2020-09-08 NOTE — Progress Notes (Signed)
Subjective:    Patient ID: Jennifer [name], female    DOB: 23-Jun-1945, 75 y.o.   MRN: [id number]  HPI 75 yo F with hx HTN, fibromyalgia, gerd, possible IBS, and of cystoplasty 1984 at [facility]. She had 12 months of bladder infection afterwards. 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 She self cath's and has had difficulty with boiling her cath's.   Previously has been on macrobid, kelfex (abd rash/fungus), IV gent (01-2015), augmentin.     UCx (03-14-15) E coli- ESBL, (S- augmentin, zosyn, imipenem, gent/tobra, nitro).  She was seen in ID 03-2015 and was given prn rx for augmentin as well as education on UTI and colonization.   UCx 11-7 E coli (> 100k) S- unasyn/augmentin, amikacin/gent, Ceftaz/Cefepime/cefoxitin, imi/mero/ertapenem, bactrim, zosyn, nitro. UCx11-2018 for UTI. UA (-) for LE and Nitr. + bacteria. She was given fosfomycin.       Her cx grew E coli- ESBL. s- nitro, augmentin, gent, imipenem.         UCx 05-2017 E coli (same sensi).   UCx 12-2018 E coli UCx 10-2019 K pneumo UCx 10-2019 E coli (ESBL)               Has uro f/u October 2021 and has been noted to have elevated Cr over the summer (0.8 --> 1.2). She had renal u/s 12-14-19 which was normal.    She had lumbar nerve ablation 03-10-20. She has had hip injections for pain but her SI joint injections were held up due to UTI. She has f/u 07-06-20. She had elmiron injection with Dr Logan Bores 05-18-20. She was noted to have exacerbation of her UTI after that and had repeat course of augmentin 500mg  tid for 7 days sent 2-23 from urology. With this she has gi upset- nausea, queasy.   She has f/u with urology 07-18-20. She has also had mental health follow up as well.     She had a UCx+. And was given augmentin by uro 05-2020. She also had to take zofran. She was seen in ID 06-16-20. She was offered monurol but deferred.   Today she has concerns she was seen in Uro 4-22 and had  UCx Escherichia coli ESBL Organism ID MICRO MIC SUSCEPTIBILITY    Escherichia coli ESBL Amoxicillin + K Clavulanate MICRO MIC SUSCEPTIBILITY <=8/4: Susceptible  Escherichia coli ESBL Ampicillin MICRO MIC SUSCEPTIBILITY >16: Resistant  Escherichia coli ESBL Ampicillin/Sulbactam MICRO MIC SUSCEPTIBILITY <=4/2: Susceptible  Escherichia coli ESBL Aztreonam MICRO MIC SUSCEPTIBILITY 8: Intermediate  Escherichia coli ESBL Cefazolin MICRO MIC SUSCEPTIBILITY >16: Resistant  Escherichia coli ESBL Cefepime MICRO MIC SUSCEPTIBILITY 4: Susceptible - Dose Dependent  Escherichia coli ESBL Cefotaxime MICRO MIC SUSCEPTIBILITY >32: Resistant  Escherichia coli ESBL Ceftriaxone MICRO MIC SUSCEPTIBILITY >32: Resistant  Escherichia coli ESBL Cefuroxime MICRO MIC SUSCEPTIBILITY >16: Resistant  Escherichia coli ESBL Ciprofloxacin MICRO MIC SUSCEPTIBILITY >2: Resistant  Escherichia coli ESBL Ertapenem MICRO MIC SUSCEPTIBILITY <=0.5: Susceptible  Escherichia coli ESBL Gentamicin MICRO MIC SUSCEPTIBILITY <=2: Susceptible  Escherichia coli ESBL Meropenem MICRO MIC SUSCEPTIBILITY <=1: Susceptible  Escherichia coli ESBL Nitrofurantoin MICRO MIC SUSCEPTIBILITY <=32: Susceptible  Escherichia coli ESBL Piperacillin/Tazobactam MICRO MIC SUSCEPTIBILITY <=8: Susceptible  Escherichia coli ESBL Tetracycline MICRO MIC SUSCEPTIBILITY >8: Resistant  Escherichia coli ESBL Trimethoprim/Sulfamethoxazole MICRO MIC SUSCEPTIBILITY >2/38: Resistant    She had injections in her back last done 4-25. She has had improvement.   She had repeat UCx done- 09-02-20. > 100k E coli. Was restarted on  augmentin. Started today.  Took her temps and they have been normal despite feeling like she has chills and fever, fatigue.  Has Uro f/u 09-12-20.   Has appt for f/u with PCP September 27, 2020- her BP have been over 180 Systolic. Has been on bistolic, losartan. Has felt her heart racing. Typically 50-60s.  Has also noted. Noted burning pain in her L breast.  Noted more acid reflux.   Review of Systems  Constitutional:  Positive for chills, fatigue and fever.  Respiratory:  Positive for chest tightness and shortness of breath.   Gastrointestinal:  Positive for nausea.  Genitourinary:  Positive for dysuria, frequency and urgency.  Neurological:  Positive for headaches (after taking zofran).      Objective:   Physical Exam Vitals reviewed.  Constitutional:      Appearance: Normal appearance.  HENT:     Mouth/Throat:     Mouth: Mucous membranes are moist.     Pharynx: No oropharyngeal exudate.  Eyes:     Extraocular Movements: Extraocular movements intact.     Pupils: Pupils are equal, round, and reactive to light.  Cardiovascular:     Rate and Rhythm: Normal rate and regular rhythm.     Heart sounds: Murmur (RSB 3/6) heard.  Pulmonary:     Effort: Pulmonary effort is normal.     Breath sounds: Normal breath sounds.  Abdominal:     General: Bowel sounds are normal. There is no distension.     Palpations: Abdomen is soft.     Tenderness: There is no abdominal tenderness.  Musculoskeletal:     Cervical back: Normal range of motion and neck supple.     Right lower leg: No edema.     Left lower leg: No edema.  Neurological:     Mental Status: She is alert.          Assessment & Plan:

## 2020-09-08 NOTE — Assessment & Plan Note (Signed)
I repeated her BP 210/89 She agrees to go to urgent care.  She will call her PCP as well to get in sooner.

## 2020-09-08 NOTE — Assessment & Plan Note (Signed)
She will start the augmentin today.  She has uro f/u on 6-13.  She will wait for (clincal) results prior to any new rx.

## 2020-09-08 NOTE — ED Notes (Signed)
Patient verbalized understanding of discharge instructions. Opportunity for questions and answers.  

## 2020-09-08 NOTE — Assessment & Plan Note (Addendum)
Will repeat her TTE.  She will o/w f/u with her PCP.

## 2020-09-08 NOTE — ED Provider Notes (Signed)
[facility]   CSN: [id number] Arrival date & time: 09/08/20  1910     History Chief Complaint  Patient presents with   Hypertension    Jennifer ReamerBarbara E [name] is a 75 y.o. female.  The history is provided by the patient and medical records. No language interpreter was used.  Hypertension   75 year old female significant history of hypertension, fibromyalgia, chronic fatigue syndrome, who presents to the ER for evaluation of elevated blood pressure.  Patient states she does have a known history of high blood pressure but states her medication was recently changed.  She was on Bystolic 10 mg and her doctor switched to Bystolic 5 mg daily.  She noticed that her blood pressure is less controlled since the switch.  Patient also endorsed having recurrent urinary tract infection that she is currently being managed by ID specialist Dr. Ninetta LightsHatcher.  She also endorsed having chronic back pain from spinal stenosis also being managed by a pain specialist.  She went to see her ID specialist Dr. Ninetta LightsHatcher today for a visit but was noted to have elevated blood pressure in the 200s systolic.  She was instructed to go to urgent care for further evaluation.  Once she arrived, her blood pressure was in the 260s systolic thus prompting recommendation to go to the ER for evaluation.  She mention for the past few days she does have some intermittent chest discomfort described more of a slight pressure sensation and burning sensation that is waxing and waning.  No active chest pain at this time.  She also states that she felt a bit "off" for the past week.  She did not complain of any focal numbness or focal weakness.  Endorsed throbbing headache without any active headache at this time.  She report she has chronic back pain and when her pain is intense her blood pressure does rise.  She denies course of taking for life.  Past Medical History:  Diagnosis Date   Acute meniscal  tear of left knee    FOLLOWED BY DR GIAFFREY   BPPV (benign paroxysmal positional vertigo)    Chronic bladder pain    Chronic fatigue syndrome    Chronic low back pain    W/ RIGHT LEG PAIN AND NUMBNESS   Cystitis, chronic    Fibromyalgia    GERD (gastroesophageal reflux disease)    Hiatal hernia    Hypertension    IC (interstitial cystitis)    OSA (obstructive sleep apnea)    per pt study yrs ago-- moderate osa ,  intolerant cpap   Pinched vertebral nerve    bilateral L2 -- L3 and L3 -- L4-/  epidural injection's treatment , PT and pain clince   PONV (postoperative nausea and vomiting)    severe   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    Self-catheterizes urinary bladder    Spinal stenosis, lumbar region with neurogenic claudication    Wears glasses    Wears partial dentures    upper and lower    Patient Active Problem List   Diagnosis Date Noted   Heart murmur 09/08/2020   Allergic rhinitis 08/17/2020   Altered bowel function 08/17/2020   Anxiety disorder 08/17/2020   Body mass index (BMI) 35.0-35.9, adult 08/17/2020   Chronic fatigue syndrome 08/17/2020   Colon cancer screening 08/17/2020   Functional diarrhea 08/17/2020   Hypothyroidism 08/17/2020   Intestinal gas excretion 08/17/2020   Localized  obesity 08/17/2020   Nausea 08/17/2020   Polypharmacy 08/17/2020   Posttraumatic headache 08/17/2020   Posttraumatic vertigo 08/17/2020   Pure hypercholesterolemia 08/17/2020   Lower abdominal pain 08/17/2020   Scoliosis 08/17/2020   Sleep apnea 08/17/2020   Unspecified abnormal finding in specimens from other organs, systems and tissues 08/17/2020   Vitamin D deficiency 08/17/2020   Back pain 04/28/2020   Candidiasis 10/15/2019   Encounter for orthopedic follow-up care 08/12/2018   Vertigo 02/26/2018   Degenerative arthritis of right knee 02/19/2018   Urinary tract infection, site not specified 07/04/2017   Arthralgia of both knees  01/23/2017   Facet arthritis, degenerative, lumbar spine 01/23/2017   Benign paroxysmal positional vertigo 07/03/2016   IBS (irritable bowel syndrome) 04/19/2015   GERD (gastroesophageal reflux disease) 03/21/2015   Fibromyalgia 03/21/2015   Recurrent bacterial cystitis 03/21/2015   HTN (hypertension) 03/21/2015   Fibrositis 03/21/2015   SI (sacroiliac) pain 12/16/2014   DDD (degenerative disc disease), lumbar 05/11/2014   Facet arthropathy 05/11/2014   Spinal stenosis of lumbar region 05/11/2014    Past Surgical History:  Procedure Laterality Date   ABDOMINAL HYSTERECTOMY  1980   w/ Left Salpingoophorectomy   CARDIAC CATHETERIZATION  08-21-2001  dr Verdis Prime   normal coronary arteries and LVF   CARPAL TUNNEL RELEASE Bilateral 1986   CECALCYSTOPLASTY/  APPENDECTOMY  1984   at [facility]   CHOLECYSTECTOMY  1992   CYSTO WITH HYDRODISTENSION N/A 01/12/2016   Procedure: CYSTOSCOPY/HYDRODISTENSION;  Surgeon: Jethro Bolus, MD;  Location: [facility];  Service: Urology;  Laterality: N/A;   CYSTO WITH HYDRODISTENSION N/A 08/30/2016   Procedure: CYSTOSCOPY/HYDRODISTENSION OF BLADDER INJECTION OF MARCAINE/PYRIDIUM;  Surgeon: Jethro Bolus, MD;  Location: [facility];  Service: Urology;  Laterality: N/A;   MULTIPLE CYSTO/  HYDRODISTENTION/  INSTILLATION THERAPY  last one 08-19-2009   NASAL SINUS SURGERY  1987   TONSILLECTOMY  as child   VAGINAL GROWTH REMOVED  as teen     OB History   No obstetric history on file.     Family History  Problem Relation Age of Onset   CAD Father    Asthma Father    Hypertension Father    Alzheimer's disease Father    Emphysema Father    COPD Father    Heart failure Mother    Peripheral vascular disease Mother    Stroke Mother    Hypertension Mother    Colonic polyp Mother    Cancer Brother        melanoma    Social History   Tobacco Use   Smoking status: Never   Smokeless tobacco: Never   Vaping Use   Vaping Use: Never used  Substance Use Topics   Alcohol use: No    Alcohol/week: 0.0 standard drinks   Drug use: No    Home Medications Prior to Admission medications   Medication Sig Start Date End Date Taking? Authorizing Provider  amoxicillin-clavulanate (AUGMENTIN) 875-125 MG tablet Take 1 tablet by mouth 2 (two) times daily. 07/26/20   [provider]  augmented betamethasone dipropionate (DIPROLENE-AF) 0.05 % cream 1 application 08/12/19   [provider]  COVID-19 mRNA vaccine, Moderna, 100 MCG/0.5ML injection INJECT AS DIRECTED 02/01/20 01/31/21  Judyann Munson, MD  D-Mannose 500 MG CAPS Take by mouth.    [provider]  DENTA 5000 PLUS 1.1 % CREA dental cream  09/16/17   [provider]  dextromethorphan-guaiFENesin (MUCINEX DM) 30-600 MG 12hr tablet Take 1 tablet  by mouth 2 (two) times daily as needed for cough.    [provider]  diazepam (VALIUM) 5 MG tablet Take 1 tab PO 20 minutes prior to procedure. 07/03/19   [provider]  fexofenadine (ALLEGRA) 60 MG tablet Take 60 mg by mouth daily. Reported on 05/03/2015 01/06/14   [provider]  fluconazole (DIFLUCAN) 100 MG tablet Take 1 tablet (100 mg total) by mouth daily. 08/04/20   Ginnie Smart, MD  Fluoxetine HCl, PMDD, 10 MG TABS Take by mouth.    [provider]  fluticasone (FLONASE) 50 MCG/ACT nasal spray USE 2 SPRAYS INTO EACH NOSTRIL AT NIGHT 09/16/17   [provider]  fosfomycin (MONUROL) 3 g PACK Take by mouth. 08/10/19   [provider]  HYDROcodone-acetaminophen (NORCO) 7.5-325 MG tablet Take 1 tablet by mouth every 8 (eight) hours as needed.  02/07/15   [provider]  hydrocortisone (ANUSOL-HC) 25 MG suppository UNWRAP AND INSERT 1 SUPPOSITORY RECTALLY TWICE A DAY AS NEEDED 05/26/17   [provider]  ipratropium (ATROVENT) 0.06 % nasal spray USE 2 SPRAYS IN EACH NOSTRIL TWICE A DAY AS NEEDED FOR  DRAINAGE    [provider]  losartan (COZAAR) 50 MG tablet Take 100 mg by mouth daily.  08/12/17   [provider]  meclizine (ANTIVERT) 25 MG tablet Take 25 mg by mouth 3 (three) times daily as needed.     [provider]  Meth-Hyo-M Bl-Benz Acd-Ph Sal 81.6 MG TABS Take 1 tablet (81.6 mg total) by mouth 2 (two) times daily as needed. 06/07/17   Ginnie Smart, MD  Methen-Hyosc-Meth Blue-Na Phos (UROGESIC-BLUE) 81.6 MG TABS TAKE 1 TABLET BY MOUTH EVERY 6 HOURS AS NEEDED BURNING 04/17/19   [provider]  methenamine (HIPREX) 1 G tablet Take by mouth as directed. TAKES WHEN NOT TAKING ANTIBIOTICS    [provider]  methocarbamol (ROBAXIN) 500 MG tablet Take 1 tablet by mouth 3 (three) times daily as needed. 03/10/20   [provider]  nebivolol (BYSTOLIC) 10 MG tablet Take 5 mg by mouth every morning.  01/08/14   [provider]  nystatin cream (MYCOSTATIN) Apply topically 2 (two) times daily. 08/04/20   Ginnie Smart, MD  omeprazole (PRILOSEC) 20 MG capsule 1 capsule ONE TO TWO TIMES DAILY 02/08/14   [provider]  ondansetron (ZOFRAN-ODT) 4 MG disintegrating tablet Take 1 tablet by mouth every 8 (eight) hours as needed. 07/01/17   [provider]  pentosan polysulfate (ELMIRON) 100 MG capsule 100 mg. WEEKLY PRN BLADDER INSTILLATION AT DR [facility] OFFICE    [provider]  Probiotic Product (PROBIOTIC DAILY) CAPS 1 capsule 10/15/17   [provider]  promethazine (PHENERGAN) 25 MG tablet 1 tablet every six (6) hours as needed. 02/08/14   [provider]  sucralfate (CARAFATE) 1 G tablet Take 1 g by mouth 2 (two) times daily.     [provider]  triamcinolone cream (KENALOG) 0.1 % BID PRN FOR ECZEMA 02/03/15   [provider]    Allergies    Avelox [moxifloxacin], Quinolones, Sulfa antibiotics, Aspirin, Dimethyl sulfoxide, Duloxetine hcl, Hydrochlorothiazide, Ibuprofen,  Nitrofurantoin, Topiramate, Adhesive [tape], Codeine, Macrodantin [nitrofurantoin macrocrystal], and Yellow dye  Review of Systems   Review of Systems  All other systems reviewed and are negative.  Physical Exam Updated Vital Signs BP 139/70   Pulse (!) 56   Temp 98.3 F (36.8 C)   Resp 15   SpO2 99%  Physical Exam Vitals and nursing note reviewed.  Constitutional:      General: She is not in acute distress.    Appearance: She is well-developed.  HENT:     Head: Normocephalic and atraumatic.  Eyes:     Conjunctiva/sclera: Conjunctivae normal.  Cardiovascular:     Rate and Rhythm: Normal rate and regular rhythm.     Pulses: Normal pulses.     Heart sounds: Normal heart sounds.  Pulmonary:     Effort: Pulmonary effort is normal.     Breath sounds: Normal breath sounds.  Abdominal:     Palpations: Abdomen is soft.  Musculoskeletal:        General: Normal range of motion.     Cervical back: Normal range of motion and neck supple. No rigidity.  Skin:    Findings: No rash.  Neurological:     Mental Status: She is alert and oriented to person, place, and time.     GCS: GCS eye subscore is 4. GCS verbal subscore is 5. GCS motor subscore is 6.     Cranial Nerves: Cranial nerves are intact.     Sensory: Sensation is intact.     Motor: Motor function is intact.     Coordination: Coordination is intact.  Psychiatric:        Mood and Affect: Mood normal.    ED Results / Procedures / Treatments   Labs (all labs ordered are listed, but only abnormal results are displayed) Labs Reviewed  COMPREHENSIVE METABOLIC PANEL - Abnormal; Notable for the following components:      Result Value   Glucose, Bld 102 (*)    Calcium 8.8 (*)    Total Protein 6.4 (*)    All other components within normal limits  TROPONIN I (HIGH SENSITIVITY)  TROPONIN I (HIGH SENSITIVITY)    EKG EKG Interpretation  Date/Time:  Thursday September 08 2020 20:40:45 EDT Ventricular Rate:  61 PR  Interval:  172 QRS Duration: 101 QT Interval:  448 QTC Calculation: 452 R Axis:   25 Text Interpretation: Sinus rhythm Inferior infarct, old Consider anterior infarct no acute STEMI Confirmed by Marianna Fuss ([id number]) on 09/08/2020 10:17:48 PM  Radiology CT Head Wo Contrast  Result Date: 09/08/2020 CLINICAL DATA:  Subarachnoid hemorrhage suspected. High blood pressure. EXAM: CT HEAD WITHOUT CONTRAST TECHNIQUE: Contiguous axial images were obtained from the base of the skull through the vertex without intravenous contrast. COMPARISON:  None. FINDINGS: Brain: No acute intracranial hemorrhage. No focal mass lesion. No CT evidence of acute infarction. No midline shift or mass effect. No hydrocephalus. Basilar cisterns are patent. There are mild periventricular and subcortical white matter hypodensities. Generalized cortical atrophy. Vascular: No hyperdense vessel or unexpected calcification. Skull: Normal. Negative for fracture or focal lesion. Sinuses/Orbits: Paranasal sinuses and mastoid air cells are clear. Orbits are clear. Other: None. IMPRESSION: No intracranial hemorrhage Mild atrophy and white matter microvascular disease. Electronically Signed   By: Genevive Bi M.D.   On: 09/08/2020 20:26   DG Chest Port 1 View  Result Date: 09/08/2020 CLINICAL DATA:  Chest pain. EXAM: PORTABLE CHEST 1 VIEW COMPARISON:  February 13, 2005 FINDINGS: The cardiac silhouette is mildly enlarged. Both lungs are clear. S-shaped scoliosis of the thoracolumbar spine is seen. IMPRESSION: No acute cardiopulmonary disease. Electronically Signed   By: Aram Candela M.D.   On: 09/08/2020 21:11    Procedures Procedures   Medications Ordered in ED Medications  labetalol (NORMODYNE) injection 20 mg (20 mg Intravenous Given 09/08/20 2044)  ED Course  I have reviewed the triage vital signs and the nursing notes.  Pertinent labs & imaging results that were available during my care of the patient were reviewed by me  and considered in my medical decision making (see chart for details).    MDM Rules/Calculators/A&P                          BP (!) 148/67   Pulse (!) 55   Temp 98.3 F (36.8 C)   Resp 15   SpO2 97%   Final Clinical Impression(s) / ED Diagnoses Final diagnoses:  None    Rx / DC Orders ED Discharge Orders     None      8:50 PM Patient sent here due to elevated blood pressure.  Elevated blood pressure seems to be for the past several days.  Patient does have known history of hypertension and did report that her blood pressure medication was decreased in dosage because of her heart rate.  She also endorsed having chronic back pain that she follow-up with pain management and her back pain has been persistently uncomfortable for several weeks which she felt could contribute to her elevated blood pressure.  She also recently started on Augmentin for urinary tract infection.  No other medication changes.  Also report she had COVID test done by her doctor recently and that was negative.  Patient has no focal neurodeficit on exam.  She denies having worst headache of life to suggest subarachnoid hemorrhage.  She is mentating appropriately, moving all 4 extremities.  Her blood pressure currently is 223/80.  Work-up initiated.  We will give labetalol to help with blood pressure control.  10:26 PM EKG without concerning ischemic changes, normal troponin, labs are reassuring, chest x-ray unremarkable, blood pressure significantly improved after patient received labetalol.  At this time I do not see any signs of endorgan damage to suggest hypertensive emergency.  I suspect patient's elevated blood pressure is multifactorial from changing her blood pressure medication as well as persistent back pain that she has been dealing with.  She needs to follow-up closely with her PCP for better blood pressure control.  I did discuss care with Dr. Stevie Kern.  11:11 PM With improved blood pressure and patient  felt better, at this time patient stable for discharge.  I have very low suspicion for subarachnoid hemorrhage I do not think LP is indicated at this time.  Patient will follow-up closely with PCP for further care.  Return precaution given.   Fayrene Helper, PA-C 09/14/20 [id number]    Milagros Loll, MD 09/15/20 [phone]

## 2020-09-12 DIAGNOSIS — I10 Essential (primary) hypertension: Secondary | ICD-10-CM | POA: Diagnosis not present

## 2020-09-12 DIAGNOSIS — I160 Hypertensive urgency: Secondary | ICD-10-CM | POA: Diagnosis not present

## 2020-09-13 ENCOUNTER — Telehealth: Payer: Self-pay

## 2020-09-13 NOTE — Telephone Encounter (Signed)
   Jennifer [name] DOB: 04-30-1945 MRN: [id number]   RIDER WAIVER AND RELEASE OF LIABILITY  For purposes of improving physical access to our facilities, [facility] is pleased to partner with third parties to provide [facility] patients or other authorized individuals the option of convenient, on-demand ground transportation services (the Chiropractor") through use of the technology service that enables users to request on-demand ground transportation from independent third-party providers.  By opting to use and accept these [employer], I, the undersigned, hereby agree on behalf of myself, and on behalf of any minor child using the Science writer for whom I am the parent or legal guardian, as follows:  Science writer provided to me are provided by independent third-party transportation providers who are not [employer] or employees and who are unaffiliated with [employer]. [facility] is neither a transportation carrier nor a common or public carrier. [facility] has no control over the quality or safety of the transportation that occurs as a result of the [employer]. [facility] cannot guarantee that any third-party transportation provider will complete any arranged transportation service. [facility] makes no representation, warranty, or guarantee regarding the reliability, timeliness, quality, safety, suitability, or availability of any of the Transport Services or that they will be error free. I fully understand that traveling by vehicle involves risks and dangers of serious bodily injury, including permanent disability, paralysis, and death. I agree, on behalf of myself and on behalf of any minor child using the Transport Services for whom I am the parent or legal guardian, that the entire risk arising out of my use of the [employer] remains solely with me, to the maximum extent permitted under applicable law. The [employer] are provided "as  is" and "as available." [facility] disclaims all representations and warranties, express, implied or statutory, not expressly set out in these terms, including the implied warranties of merchantability and fitness for a particular purpose. I hereby waive and release [facility], its agents, employees, officers, directors, representatives, insurers, attorneys, assigns, successors, subsidiaries, and affiliates from any and all past, present, or future claims, demands, liabilities, actions, causes of action, or suits of any kind directly or indirectly arising from acceptance and use of the [employer]. I further waive and release [facility] and its affiliates from all present and future liability and responsibility for any injury or death to persons or damages to property caused by or related to the use of the [employer]. I have read this Waiver and Release of Liability, and I understand the terms used in it and their legal significance. This Waiver is freely and voluntarily given with the understanding that my right (as well as the right of any minor child for whom I am the parent or legal guardian using the [employer]) to legal recourse against  in connection with the [employer] is knowingly surrendered in return for use of these services.   I attest that I read the consent document to Domenica Reamer, gave Ms. Denardo the opportunity to ask questions and answered the questions asked (if any). I affirm that XELA OREGEL then provided consent for she's participation in this program.     Ephriam Knuckles Vilsaint

## 2020-09-13 NOTE — Telephone Encounter (Signed)
Seen by [facility] and sent to Urgent care who then sent her to ED. ED wrote her for  labetalol (normodyne) after CT,EKGs, and Echocardiogram. This lowered BP 133/65. Was released to go home and instructed to call PCP. She called and could not get in with her reg doctor but saw A PA. Will have Echo at 9:05 am Thursday. (Scheduled herself) Will call Friday if no word on results for Echo. Also on Amox 500-125 mg tid for UTI -Dr Logan Bores. Will await call after Echo.

## 2020-09-15 ENCOUNTER — Encounter (HOSPITAL_COMMUNITY): Payer: Self-pay

## 2020-09-15 ENCOUNTER — Other Ambulatory Visit: Payer: Self-pay

## 2020-09-15 ENCOUNTER — Ambulatory Visit (INDEPENDENT_AMBULATORY_CARE_PROVIDER_SITE_OTHER): Payer: Medicare Other | Admitting: Internal Medicine

## 2020-09-15 ENCOUNTER — Encounter (HOSPITAL_COMMUNITY): Payer: Self-pay | Admitting: *Deleted

## 2020-09-15 ENCOUNTER — Inpatient Hospital Stay (HOSPITAL_COMMUNITY)
Admission: EM | Admit: 2020-09-15 | Discharge: 2020-09-27 | DRG: 287 | Disposition: A | Discharge status: Home / Self Care | Payer: Medicare Other | Source: Ambulatory Visit | Attending: Internal Medicine | Admitting: Internal Medicine

## 2020-09-15 ENCOUNTER — Ambulatory Visit (HOSPITAL_BASED_OUTPATIENT_CLINIC_OR_DEPARTMENT_OTHER): Payer: Medicare Other

## 2020-09-15 ENCOUNTER — Emergency Department (HOSPITAL_COMMUNITY): Payer: Medicare Other

## 2020-09-15 DIAGNOSIS — B372 Candidiasis of skin and nail: Secondary | ICD-10-CM | POA: Diagnosis present

## 2020-09-15 DIAGNOSIS — Z823 Family history of stroke: Secondary | ICD-10-CM

## 2020-09-15 DIAGNOSIS — M797 Fibromyalgia: Secondary | ICD-10-CM | POA: Diagnosis present

## 2020-09-15 DIAGNOSIS — I209 Angina pectoris, unspecified: Secondary | ICD-10-CM | POA: Diagnosis present

## 2020-09-15 DIAGNOSIS — M545 Low back pain, unspecified: Secondary | ICD-10-CM | POA: Diagnosis not present

## 2020-09-15 DIAGNOSIS — I712 Thoracic aortic aneurysm, without rupture: Secondary | ICD-10-CM | POA: Diagnosis present

## 2020-09-15 DIAGNOSIS — R011 Cardiac murmur, unspecified: Secondary | ICD-10-CM | POA: Insufficient documentation

## 2020-09-15 DIAGNOSIS — I2510 Atherosclerotic heart disease of native coronary artery without angina pectoris: Secondary | ICD-10-CM | POA: Diagnosis not present

## 2020-09-15 DIAGNOSIS — Z20822 Contact with and (suspected) exposure to covid-19: Secondary | ICD-10-CM | POA: Diagnosis not present

## 2020-09-15 DIAGNOSIS — Z885 Allergy status to narcotic agent status: Secondary | ICD-10-CM

## 2020-09-15 DIAGNOSIS — Z8249 Family history of ischemic heart disease and other diseases of the circulatory system: Secondary | ICD-10-CM

## 2020-09-15 DIAGNOSIS — R001 Bradycardia, unspecified: Secondary | ICD-10-CM | POA: Diagnosis not present

## 2020-09-15 DIAGNOSIS — I10 Essential (primary) hypertension: Secondary | ICD-10-CM | POA: Diagnosis not present

## 2020-09-15 DIAGNOSIS — G894 Chronic pain syndrome: Secondary | ICD-10-CM | POA: Diagnosis present

## 2020-09-15 DIAGNOSIS — R0789 Other chest pain: Secondary | ICD-10-CM | POA: Diagnosis not present

## 2020-09-15 DIAGNOSIS — G8929 Other chronic pain: Secondary | ICD-10-CM | POA: Diagnosis present

## 2020-09-15 DIAGNOSIS — R079 Chest pain, unspecified: Secondary | ICD-10-CM

## 2020-09-15 DIAGNOSIS — N3010 Interstitial cystitis (chronic) without hematuria: Secondary | ICD-10-CM | POA: Diagnosis present

## 2020-09-15 DIAGNOSIS — I25110 Atherosclerotic heart disease of native coronary artery with unstable angina pectoris: Principal | ICD-10-CM | POA: Diagnosis present

## 2020-09-15 DIAGNOSIS — I161 Hypertensive emergency: Secondary | ICD-10-CM | POA: Diagnosis present

## 2020-09-15 DIAGNOSIS — Z882 Allergy status to sulfonamides status: Secondary | ICD-10-CM

## 2020-09-15 DIAGNOSIS — Z825 Family history of asthma and other chronic lower respiratory diseases: Secondary | ICD-10-CM

## 2020-09-15 DIAGNOSIS — D649 Anemia, unspecified: Secondary | ICD-10-CM | POA: Diagnosis present

## 2020-09-15 DIAGNOSIS — Z79899 Other long term (current) drug therapy: Secondary | ICD-10-CM

## 2020-09-15 DIAGNOSIS — K219 Gastro-esophageal reflux disease without esophagitis: Secondary | ICD-10-CM | POA: Diagnosis not present

## 2020-09-15 DIAGNOSIS — Z888 Allergy status to other drugs, medicaments and biological substances status: Secondary | ICD-10-CM

## 2020-09-15 DIAGNOSIS — Z808 Family history of malignant neoplasm of other organs or systems: Secondary | ICD-10-CM

## 2020-09-15 DIAGNOSIS — G4489 Other headache syndrome: Secondary | ICD-10-CM | POA: Diagnosis not present

## 2020-09-15 DIAGNOSIS — E785 Hyperlipidemia, unspecified: Secondary | ICD-10-CM | POA: Diagnosis present

## 2020-09-15 DIAGNOSIS — I200 Unstable angina: Secondary | ICD-10-CM

## 2020-09-15 DIAGNOSIS — M48061 Spinal stenosis, lumbar region without neurogenic claudication: Secondary | ICD-10-CM | POA: Diagnosis present

## 2020-09-15 DIAGNOSIS — Z886 Allergy status to analgesic agent status: Secondary | ICD-10-CM

## 2020-09-15 DIAGNOSIS — I110 Hypertensive heart disease with heart failure: Secondary | ICD-10-CM | POA: Diagnosis present

## 2020-09-15 DIAGNOSIS — I350 Nonrheumatic aortic (valve) stenosis: Secondary | ICD-10-CM | POA: Diagnosis present

## 2020-09-15 DIAGNOSIS — E876 Hypokalemia: Secondary | ICD-10-CM | POA: Diagnosis present

## 2020-09-15 DIAGNOSIS — I5032 Chronic diastolic (congestive) heart failure: Secondary | ICD-10-CM | POA: Diagnosis not present

## 2020-09-15 DIAGNOSIS — I169 Hypertensive crisis, unspecified: Secondary | ICD-10-CM | POA: Diagnosis present

## 2020-09-15 DIAGNOSIS — I160 Hypertensive urgency: Secondary | ICD-10-CM | POA: Diagnosis not present

## 2020-09-15 DIAGNOSIS — E669 Obesity, unspecified: Secondary | ICD-10-CM | POA: Diagnosis present

## 2020-09-15 DIAGNOSIS — Z820 Family history of epilepsy and other diseases of the nervous system: Secondary | ICD-10-CM

## 2020-09-15 DIAGNOSIS — Z6833 Body mass index (BMI) 33.0-33.9, adult: Secondary | ICD-10-CM

## 2020-09-15 DIAGNOSIS — Z8371 Family history of colonic polyps: Secondary | ICD-10-CM

## 2020-09-15 DIAGNOSIS — M549 Dorsalgia, unspecified: Secondary | ICD-10-CM | POA: Diagnosis not present

## 2020-09-15 LAB — ECHOCARDIOGRAM COMPLETE
AR max vel: 2.12 cm2
AV Area VTI: 2.5 cm2
AV Area mean vel: 1.99 cm2
AV Mean grad: 10.7 mmHg
AV Peak grad: 20 mmHg
Ao pk vel: 2.24 m/s
Area-P 1/2: 3.17 cm2
S' Lateral: 2.3 cm

## 2020-09-15 LAB — BASIC METABOLIC PANEL
Anion gap: 10 (ref 5–15)
BUN: 8 mg/dL (ref 8–23)
CO2: 24 mmol/L (ref 22–32)
Calcium: 8.6 mg/dL — ABNORMAL LOW (ref 8.9–10.3)
Chloride: 106 mmol/L (ref 98–111)
Creatinine, Ser: 0.71 mg/dL (ref 0.44–1.00)
GFR, Estimated: >60 mL/min (ref >60)
Glucose, Bld: 101 mg/dL — ABNORMAL HIGH (ref 70–99)
Potassium: 3.6 mmol/L (ref 3.5–5.1)
Sodium: 140 mmol/L (ref 135–145)

## 2020-09-15 LAB — CBC
HCT: 35.7 % — ABNORMAL LOW (ref 36.0–46.0)
Hemoglobin: 11.6 g/dL — ABNORMAL LOW (ref 12.0–15.0)
MCH: 28.6 pg (ref 26.0–34.0)
MCHC: 32.5 g/dL (ref 30.0–36.0)
MCV: 88.1 fL (ref 80.0–100.0)
Platelets: 258 10*3/uL (ref 150–400)
RBC: 4.05 MIL/uL (ref 3.87–5.11)
RDW: 12.9 % (ref 11.5–15.5)
WBC: 7.4 10*3/uL (ref 4.0–10.5)
nRBC: 0 % (ref 0.0–0.2)

## 2020-09-15 LAB — TROPONIN I (HIGH SENSITIVITY)
Troponin I (High Sensitivity): 16 ng/L (ref <18)
Troponin I (High Sensitivity): 13 ng/L (ref <18)

## 2020-09-15 MED ORDER — HYDRALAZINE HCL 25 MG PO TABS: 25.0000 mg | ORAL_TABLET | Freq: Four times a day (QID) | ORAL | Status: DC

## 2020-09-15 MED ORDER — AMOXICILLIN-POT CLAVULANATE 500-125 MG PO TABS
1.0000 | ORAL_TABLET | Freq: Three times a day (TID) | ORAL | Status: DC
  Administered 2020-09-15 – 2020-09-16 (×2): 500 mg via ORAL
  Filled 2020-09-15 (×4): qty 1

## 2020-09-15 MED ORDER — HYDROCODONE-ACETAMINOPHEN 7.5-325 MG PO TABS
1.0000 | ORAL_TABLET | Freq: Four times a day (QID) | ORAL | Status: DC | PRN
Start: 2020-09-15 — End: 2020-09-27
  Administered 2020-09-16 – 2020-09-27 (×31): 1 via ORAL
  Filled 2020-09-15 (×33): qty 1

## 2020-09-15 MED ORDER — NEBIVOLOL HCL 5 MG PO TABS
5.0000 mg | ORAL_TABLET | Freq: Every morning | ORAL | Status: DC
  Administered 2020-09-16: 5 mg via ORAL
  Filled 2020-09-15: qty 1

## 2020-09-15 MED ORDER — HYDROMORPHONE HCL 1 MG/ML IJ SOLN: 0.5000 mg | Freq: Once | INTRAMUSCULAR | Status: DC

## 2020-09-15 MED ORDER — NITROGLYCERIN 0.4 MG SL SUBL
0.4000 mg | SUBLINGUAL_TABLET | SUBLINGUAL | Status: DC | PRN
  Administered 2020-09-16 – 2020-09-25 (×11): 0.4 mg via SUBLINGUAL
  Filled 2020-09-15 (×9): qty 1

## 2020-09-15 MED ORDER — HYDRALAZINE HCL 25 MG PO TABS
25.0000 mg | ORAL_TABLET | Freq: Four times a day (QID) | ORAL | Status: DC
  Administered 2020-09-16 (×2): 25 mg via ORAL
  Filled 2020-09-15 (×2): qty 1

## 2020-09-15 MED ORDER — FLUTICASONE PROPIONATE 50 MCG/ACT NA SUSP
1.0000 | Freq: Every day | NASAL | Status: DC
  Administered 2020-09-17: 1 via NASAL
  Filled 2020-09-15: qty 16

## 2020-09-15 MED ORDER — NITROGLYCERIN 0.4 MG SL SUBL
0.8000 mg | SUBLINGUAL_TABLET | Freq: Once | SUBLINGUAL | Status: AC
  Administered 2020-09-15: 0.8 mg via SUBLINGUAL

## 2020-09-15 MED ORDER — IOHEXOL 350 MG/ML SOLN
100.0000 mL | Freq: Once | INTRAVENOUS | Status: AC | PRN
  Administered 2020-09-15: 100 mL via INTRAVENOUS

## 2020-09-15 MED ORDER — HYDROMORPHONE HCL 1 MG/ML IJ SOLN
0.5000 mg | Freq: Once | INTRAMUSCULAR | Status: AC
  Administered 2020-09-15: 0.5 mg via INTRAVENOUS
  Filled 2020-09-15: qty 1

## 2020-09-15 MED ORDER — HYDRALAZINE HCL 20 MG/ML IJ SOLN: 10.0000 mg | Freq: Four times a day (QID) | INTRAMUSCULAR | Status: DC | PRN

## 2020-09-15 MED ORDER — ENOXAPARIN SODIUM 40 MG/0.4ML IJ SOSY
40.0000 mg | PREFILLED_SYRINGE | INTRAMUSCULAR | Status: DC
  Administered 2020-09-15 – 2020-09-26 (×12): 40 mg via SUBCUTANEOUS
  Filled 2020-09-15 (×13): qty 0.4

## 2020-09-15 MED ORDER — ATORVASTATIN CALCIUM 40 MG PO TABS
40.0000 mg | ORAL_TABLET | Freq: Every day | ORAL | Status: DC
  Administered 2020-09-15 – 2020-09-27 (×13): 40 mg via ORAL
  Filled 2020-09-15 (×13): qty 1

## 2020-09-15 MED ORDER — NITROGLYCERIN 0.4 MG SL SUBL
SUBLINGUAL_TABLET | SUBLINGUAL | Status: AC
  Filled 2020-09-15: qty 2

## 2020-09-15 MED ORDER — PANTOPRAZOLE SODIUM 40 MG PO TBEC
40.0000 mg | DELAYED_RELEASE_TABLET | Freq: Every day | ORAL | Status: DC
  Administered 2020-09-16 – 2020-09-21 (×6): 40 mg via ORAL
  Filled 2020-09-15 (×6): qty 1

## 2020-09-15 MED ORDER — ACETAMINOPHEN 325 MG PO TABS
650.0000 mg | ORAL_TABLET | ORAL | Status: DC | PRN
  Administered 2020-09-16 – 2020-09-22 (×7): 650 mg via ORAL
  Administered 2020-09-25: 325 mg via ORAL
  Filled 2020-09-15 (×10): qty 2

## 2020-09-15 MED ORDER — HYDROMORPHONE HCL 1 MG/ML IJ SOLN
1.0000 mg | Freq: Once | INTRAMUSCULAR | Status: AC
  Administered 2020-09-15: 1 mg via INTRAVENOUS
  Filled 2020-09-15: qty 1

## 2020-09-15 MED ORDER — LOSARTAN POTASSIUM 50 MG PO TABS
100.0000 mg | ORAL_TABLET | Freq: Every day | ORAL | Status: DC
  Administered 2020-09-16 – 2020-09-27 (×12): 100 mg via ORAL
  Filled 2020-09-15 (×12): qty 2

## 2020-09-15 NOTE — ED Provider Notes (Signed)
[facility] Provider Note   CSN: [id number] Arrival date & time: 09/15/20  1154     History Chief Complaint  Patient presents with   Chest Pain    hypertensive    Domenica ReamerBarbara E Wanless is a 75 y.o. female.  HPI   Pt is a 75 y/o female with a h/o BPPV, chronic cystitis, fibromyalgia, GERD, hypertension, OSA, lumbar stenosis, who presents to the ED today for eval of chest pain and HTN. She described pain as pressure and burning. States it feeling slike there is a weight on her chest. She states both arms have felt strange and she has had a burning sensation. She further c/o jaw discomfort, nausea, sob and palpitations. She was getting an echo pta and was sent here due to elevated BP.   Reports a mild headache. Denies unilateral numbness/weakness.  PT further reports hx spinal stenosis and pain has not been under control recently. She wonders if her elevated bp could be secondary to increased pain.   Past Medical History:  Diagnosis Date   Acute meniscal tear of left knee    FOLLOWED BY DR GIAFFREY   BPPV (benign paroxysmal positional vertigo)    Chronic bladder pain    Chronic fatigue syndrome    Chronic low back pain    W/ RIGHT LEG PAIN AND NUMBNESS   Cystitis, chronic    Fibromyalgia    GERD (gastroesophageal reflux disease)    Hiatal hernia    Hypertension    IC (interstitial cystitis)    OSA (obstructive sleep apnea)    per pt study yrs ago-- moderate osa ,  intolerant cpap   Pinched vertebral nerve    bilateral L2 -- L3 and L3 -- L4-/  epidural injection's treatment , PT and pain clince   PONV (postoperative nausea and vomiting)    severe   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    Self-catheterizes urinary bladder    Spinal stenosis, lumbar region with neurogenic claudication    Wears glasses    Wears partial dentures    upper and lower    Patient Active Problem List   Diagnosis Date Noted    Chest pain of uncertain etiology 09/15/2020   Hypertensive emergency 09/15/2020   Heart murmur 09/08/2020   Allergic rhinitis 08/17/2020   Altered bowel function 08/17/2020   Anxiety disorder 08/17/2020   Body mass index (BMI) 35.0-35.9, adult 08/17/2020   Chronic fatigue syndrome 08/17/2020   Colon cancer screening 08/17/2020   Functional diarrhea 08/17/2020   Hypothyroidism 08/17/2020   Intestinal gas excretion 08/17/2020   Localized obesity 08/17/2020   Nausea 08/17/2020   Polypharmacy 08/17/2020   Posttraumatic headache 08/17/2020   Posttraumatic vertigo 08/17/2020   Pure hypercholesterolemia 08/17/2020   Lower abdominal pain 08/17/2020   Scoliosis 08/17/2020   Sleep apnea 08/17/2020   Unspecified abnormal finding in specimens from other organs, systems and tissues 08/17/2020   Vitamin D deficiency 08/17/2020   Back pain 04/28/2020   Candidiasis 10/15/2019   Encounter for orthopedic follow-up care 08/12/2018   Vertigo 02/26/2018   Degenerative arthritis of right knee 02/19/2018   Urinary tract infection, site not specified 07/04/2017   Arthralgia of both knees 01/23/2017   Facet arthritis, degenerative, lumbar spine 01/23/2017   Benign paroxysmal positional vertigo 07/03/2016   IBS (irritable bowel syndrome) 04/19/2015   GERD (gastroesophageal reflux disease) 03/21/2015   Fibromyalgia 03/21/2015   Recurrent bacterial cystitis 03/21/2015  HTN (hypertension) 03/21/2015   Fibrositis 03/21/2015   SI (sacroiliac) pain 12/16/2014   DDD (degenerative disc disease), lumbar 05/11/2014   Facet arthropathy 05/11/2014   Spinal stenosis of lumbar region 05/11/2014    Past Surgical History:  Procedure Laterality Date   ABDOMINAL HYSTERECTOMY  1980   w/ Left Salpingoophorectomy   CARDIAC CATHETERIZATION  08-21-2001  dr Verdis Prime   normal coronary arteries and LVF   CARPAL TUNNEL RELEASE Bilateral 1986   CECALCYSTOPLASTY/  APPENDECTOMY  1984   at [facility]    CHOLECYSTECTOMY  1992   CYSTO WITH HYDRODISTENSION N/A 01/12/2016   Procedure: CYSTOSCOPY/HYDRODISTENSION;  Surgeon: Jethro Bolus, MD;  Location: [facility];  Service: Urology;  Laterality: N/A;   CYSTO WITH HYDRODISTENSION N/A 08/30/2016   Procedure: CYSTOSCOPY/HYDRODISTENSION OF BLADDER INJECTION OF MARCAINE/PYRIDIUM;  Surgeon: Jethro Bolus, MD;  Location: [facility];  Service: Urology;  Laterality: N/A;   MULTIPLE CYSTO/  HYDRODISTENTION/  INSTILLATION THERAPY  last one 08-19-2009   NASAL SINUS SURGERY  1987   TONSILLECTOMY  as child   VAGINAL GROWTH REMOVED  as teen     OB History   No obstetric history on file.     Family History  Problem Relation Age of Onset   CAD Father    Asthma Father    Hypertension Father    Alzheimer's disease Father    Emphysema Father    COPD Father    Heart failure Mother    Peripheral vascular disease Mother    Stroke Mother    Hypertension Mother    Colonic polyp Mother    Cancer Brother        melanoma    Social History   Tobacco Use   Smoking status: Never   Smokeless tobacco: Never  Vaping Use   Vaping Use: Never used  Substance Use Topics   Alcohol use: No    Alcohol/week: 0.0 standard drinks   Drug use: No    Home Medications Prior to Admission medications   Medication Sig Start Date End Date Taking? Authorizing Provider  acetaminophen (TYLENOL) 500 MG tablet Take 500 mg by mouth 2 (two) times daily as needed (for pain).   Yes [provider]  amoxicillin-clavulanate (AUGMENTIN) 500-125 MG tablet Take 1 tablet by mouth 3 (three) times daily. 09/05/20  Yes [provider]  D-Mannose 500 MG CAPS Take 500-1,500 mg by mouth 3 (three) times daily.   Yes [provider]  dextromethorphan-guaiFENesin (MUCINEX DM) 30-600 MG 12hr tablet Take 1 tablet by mouth 2 (two) times daily as needed for cough.   Yes [provider]  fexofenadine (ALLEGRA) 60 MG tablet  Take 60 mg by mouth See admin instructions. Take 60 mg by mouth one to two times a day as needed for allergy symptoms 01/06/14  Yes [provider]  fluconazole (DIFLUCAN) 100 MG tablet Take 1 tablet (100 mg total) by mouth daily. Patient taking differently: Take 100 mg by mouth daily as needed (AS DIRECTED- when taking antibiotics). 08/04/20  Yes Ginnie Smart, MD  HYDROcodone-acetaminophen (NORCO) 7.5-325 MG tablet Take 1 tablet by mouth See admin instructions. Take 1 tablet by mouth three to four times a day 02/07/15  Yes [provider]  ipratropium (ATROVENT) 0.06 % nasal spray Place 2 sprays into both nostrils 2 (two) times daily as needed for rhinitis.   Yes [provider]  losartan (COZAAR) 100 MG tablet Take 100 mg by mouth daily.   Yes [provider]  meclizine (ANTIVERT) 25 MG tablet Take 25 mg by mouth 3 (three) times daily as needed for dizziness.   Yes [provider]  Meth-Hyo-M Bl-Benz Acd-Ph Sal 81.6 MG TABS Take 1 tablet (81.6 mg total) by mouth 2 (two) times daily as needed. Patient taking differently: Take 1 tablet by mouth 2 (two) times daily as needed (for strong spasms and burning). 06/07/17  Yes Ginnie Smart, MD  nebivolol (BYSTOLIC) 5 MG tablet Take 10 mg by mouth daily.   Yes [provider]  nystatin cream (MYCOSTATIN) Apply topically 2 (two) times daily. 08/04/20  Yes Ginnie Smart, MD  omeprazole (PRILOSEC) 20 MG capsule Take 20 mg by mouth See admin instructions. Take 20 mg by mouth in the morning one hour before breakfast and an additional 20 mg before evening meal as needed for GERD 02/08/14  Yes [provider]  ondansetron (ZOFRAN-ODT) 4 MG disintegrating tablet Take 4 mg by mouth every 8 (eight) hours as needed for nausea (dissolve orally). 07/01/17  Yes [provider]  sucralfate (CARAFATE) 1 G tablet Take 1 g by mouth See admin instructions. Take 1 gram by mouth in the morning and an  additional 1 gram once a day for flares   Yes [provider]  triamcinolone cream (KENALOG) 0.1 % Apply 1 application topically daily as needed (for eczema). 02/03/15  Yes [provider]  amoxicillin-clavulanate (AUGMENTIN) 875-125 MG tablet Take 1 tablet by mouth 2 (two) times daily. 07/26/20   [provider]  augmented betamethasone dipropionate (DIPROLENE-AF) 0.05 % cream 1 application 08/12/19   [provider]  COVID-19 mRNA vaccine, Moderna, 100 MCG/0.5ML injection INJECT AS DIRECTED Patient not taking: Reported on 09/15/2020 02/01/20 01/31/21  Judyann Munson, MD  DENTA 5000 PLUS 1.1 % CREA dental cream  09/16/17   [provider]  diazepam (VALIUM) 5 MG tablet Take 5 mg by mouth See admin instructions. Take 5 mg by mouth prior to procedures 07/03/19   [provider]  Fluoxetine HCl, PMDD, 10 MG TABS Take by mouth.    [provider]  fluticasone (FLONASE) 50 MCG/ACT nasal spray USE 2 SPRAYS INTO EACH NOSTRIL AT NIGHT 09/16/17   [provider]  fosfomycin (MONUROL) 3 g PACK Take by mouth. 08/10/19   [provider]  hydrocortisone (ANUSOL-HC) 25 MG suppository UNWRAP AND INSERT 1 SUPPOSITORY RECTALLY TWICE A DAY AS NEEDED Patient not taking: No sig reported 05/26/17   [provider]  losartan (COZAAR) 50 MG tablet Take 100 mg by mouth daily.  08/12/17   [provider]  Methen-Hyosc-Meth Blue-Na Phos (UROGESIC-BLUE) 81.6 MG TABS TAKE 1 TABLET BY MOUTH EVERY 6 HOURS AS NEEDED BURNING 04/17/19   [provider]  methenamine (HIPREX) 1 G tablet Take by mouth as directed. TAKES WHEN NOT TAKING ANTIBIOTICS    [provider]  methocarbamol (ROBAXIN) 500 MG tablet Take 1 tablet by mouth 3 (three) times daily as needed for muscle spasms. Patient not taking: Reported on 09/15/2020 03/10/20   [provider]  nebivolol (BYSTOLIC) 10 MG tablet Take 5 mg by mouth every morning.  01/08/14    [provider]  pentosan polysulfate (ELMIRON) 100 MG capsule 100 mg. WEEKLY PRN BLADDER INSTILLATION AT DR Patsi Sears OFFICE Patient not taking: No sig reported    [provider]  Probiotic Product (PROBIOTIC DAILY) CAPS 1 capsule Patient not taking: Reported on 09/15/2020 10/15/17   [provider]  promethazine (PHENERGAN) 25 MG  tablet 1 tablet every six (6) hours as needed. 02/08/14   [provider]    Allergies    Moxifloxacin, Quinolones, Sulfa antibiotics, Furosemide, Aspirin, Duloxetine hcl, Hydrochlorothiazide, Ibuprofen, Other, Robaxin [methocarbamol], Topiramate, Zofran [ondansetron hcl], Codeine, Dimethyl sulfoxide, Macrodantin [nitrofurantoin macrocrystal], Nitrofurantoin, Tape, and Yellow dye  Review of Systems   Review of Systems  Constitutional:  Negative for chills and fever.  HENT:  Negative for ear pain and sore throat.   Eyes:  Negative for visual disturbance.  Respiratory:  Positive for shortness of breath. Negative for cough.   Cardiovascular:  Positive for chest pain, palpitations and leg swelling (chronic, unchanged).  Gastrointestinal:  Positive for nausea. Negative for abdominal pain, constipation, diarrhea and vomiting.  Genitourinary:  Negative for dysuria and hematuria.  Musculoskeletal:  Negative for back pain.  Skin:  Negative for color change and rash.  Neurological:  Positive for headaches. Negative for speech difficulty and numbness.  All other systems reviewed and are negative.  Physical Exam Updated Vital Signs BP (!) 168/72   Pulse (!) 50   Temp 99 F (37.2 C) (Oral)   Resp 19   Ht  (1.651 m)   Wt 91.2 kg   SpO2 97%   BMI 33.45 kg/m   Physical Exam Vitals and nursing note reviewed.  Constitutional:      General: She is not in acute distress.    Appearance: She is well-developed.  HENT:     Head: Normocephalic and atraumatic.  Eyes:     Conjunctiva/sclera: Conjunctivae normal.  Cardiovascular:      Rate and Rhythm: Normal rate and regular rhythm.     Heart sounds: Murmur heard.  Pulmonary:     Effort: Pulmonary effort is normal. No respiratory distress.     Breath sounds: Normal breath sounds. No decreased breath sounds, wheezing, rhonchi or rales.  Abdominal:     General: Bowel sounds are normal.     Palpations: Abdomen is soft.     Tenderness: There is no abdominal tenderness. There is no guarding or rebound.  Musculoskeletal:     Cervical back: Neck supple.     Right lower leg: No tenderness. No edema.     Left lower leg: No tenderness. No edema.  Skin:    General: Skin is warm and dry.  Neurological:     Mental Status: She is alert.    ED Results / Procedures / Treatments   Labs (all labs ordered are listed, but only abnormal results are displayed) Labs Reviewed  BASIC METABOLIC PANEL - Abnormal; Notable for the following components:      Result Value   Glucose, Bld 101 (*)    Calcium 8.6 (*)    All other components within normal limits  CBC - Abnormal; Notable for the following components:   Hemoglobin 11.6 (*)    HCT 35.7 (*)    All other components within normal limits  TROPONIN I (HIGH SENSITIVITY)  TROPONIN I (HIGH SENSITIVITY)    EKG None  Radiology CT Head Wo Contrast  Result Date: 09/15/2020 CLINICAL DATA:  Headache EXAM: CT HEAD WITHOUT CONTRAST TECHNIQUE: Contiguous axial images were obtained from the base of the skull through the vertex without intravenous contrast. COMPARISON:  None. FINDINGS: Brain: Normal anatomic configuration. Parenchymal volume loss is commensurate with the patient's age. Mild periventricular white matter changes are present likely reflecting the sequela of small vessel ischemia. No abnormal intra or extra-axial mass lesion or fluid collection. No abnormal mass effect or midline  shift. No evidence of acute intracranial hemorrhage or infarct. Ventricular size is normal. Cerebellum unremarkable. Vascular: No asymmetric  hyperdense vasculature at the skull base. Skull: Intact Sinuses/Orbits: Mucous retention cyst noted within the right maxillary sinus. Remaining paranasal sinuses are clear. Orbits are unremarkable. Other: Mastoid air cells and middle ear cavities are clear. IMPRESSION: No acute intracranial abnormality.  Mild senescent change. Electronically Signed   By: Helyn Numbers MD   On: 09/15/2020 15:53   CT CORONARY MORPH W/CTA COR W/SCORE W/CA W/CM &/OR WO/CM  Addendum Date: 09/15/2020   ADDENDUM REPORT: 09/15/2020 18:26 CLINICAL DATA:  75 Year-old Caucasian Female EXAM: Cardiac/Coronary  CTA TECHNIQUE: The patient was scanned on a [employer]. FINDINGS: A 100 kV prospective scan was triggered in the descending thoracic aorta at 111 HU's. Axial non-contrast 3 mm slices were carried out through the heart. The data set was analyzed on a dedicated work station and scored using the Agatson method. Gantry rotation speed was 250 msecs and collimation was .6 mm. No beta blockade and 0.8 mg of sl NTG was given. The 3D data set was reconstructed in 5% intervals of the 67-82 % of the R-R cycle. Diastolic phases were analyzed on a dedicated work station using MPR, MIP and VRT modes. The patient received 100 cc of contrast. Aorta: Ascending aorta is mildly dilated for age and body surface area: 40 mm. No calcifications in cardiac field of view. No dissection. Main Pulmonary Artery: Mild dilation of the pulmonary artery: 32 mm with artery to aorta ratio of < 0.9. Aortic Valve:  Tri-leaflet.  Mild annular calcification. Coronary Arteries:  Normal coronary origin.  Right dominance. Coronary Calcium Score: Left main: 0 Left anterior descending artery: 156 Left circumflex artery: 0 Right coronary artery: 1 Total: 157 Percentile: 74th for age, sex, and race matched control. RCA is a large dominant artery that gives rise to PDA and PLA. There is no plaque. Left main is a large artery that gives rise to LAD and LCX arteries.  There is no plaque. LAD is a large vessel that gives rise to one small D1 Branch. There is a mild non obstructive stenosis (25-49%) calcific plaque in the proximal LAD. There is a moderate stenosis (50-70%) calcific plaque in the mid LAD. There is a moderate stenosis (50-70%) calcific plaque in the distal LAD. LCX is a non-dominant artery.  There is no plaque. There is a ramus intermedius vessel with minimal non-obstructive (1-24%) soft plaque in the mid vessel. Other findings: Normal pulmonary vein drainage into the left atrium. Normal left atrial appendage without a thrombus. Cannot exclude small patent foramen ovale. Extra-cardiac findings: See attached radiology report for non-cardiac structures. IMPRESSION: 1. Coronary calcium score of 157. This was 74th percentile for age, sex, and race matched control. 2. Normal coronary origin with right dominance. 3. CAD-RADS 3. Moderate stenosis. Consider symptom-guided anti-ischemic pharmacotherapy as well as risk factor modification per guideline directed care. Additional analysis with CT FFR will be submitted. 4. Ascending aorta is mildly dilation for age and body surface area: 40 mm. 5. Mild dilation of the pulmonary artery. 6. Cannot exclude small patent foramen ovale. Reaching out to primary team. RECOMMENDATIONS: Coronary artery calcium (CAC) score is a strong predictor of incident coronary heart disease (CHD) and provides predictive information beyond traditional risk factors. CAC scoring is reasonable to use in the decision to withhold, postpone, or initiate statin therapy in intermediate-risk or selected borderline-risk asymptomatic adults (age 83-75 years and LDL-C >=70 to <190 mg/dL)  who do not have diabetes or established atherosclerotic cardiovascular disease (ASCVD).* In intermediate-risk (10-year ASCVD risk >=7.5% to <20%) adults or selected borderline-risk (10-year ASCVD risk >=5% to <7.5%) adults in whom a CAC score is measured for the purpose of making a  treatment decision the following recommendations have been made: If CAC = 0, it is reasonable to withhold statin therapy and reassess in 5 to 10 years, as long as higher risk conditions are absent (diabetes mellitus, family history of premature CHD in first degree relatives (males <55 years; females <65 years), cigarette smoking, LDL >=190 mg/dL or other independent risk factors). If CAC is 1 to 99, it is reasonable to initiate statin therapy for patients ?73 years of age. If CAC is >=100 or >=75th percentile, it is reasonable to initiate statin therapy at any age. Cardiology referral should be considered for patients with CAC scores =400 or >=75th percentile. *2018 AHA/ACC/AACVPR/AAPA/ABC/ACPM/ADA/AGS/APhA/ASPC/NLA/PCNA Guideline on the Management of Blood Cholesterol: A Report of the American College of Cardiology/American Heart Association Task Force on Clinical Practice Guidelines. J Am Coll Cardiol. 2019;73(24):3168-3209. Riley Lam, MD Electronically Signed   By: Riley Lam MD   On: 09/15/2020 18:26   Result Date: 09/15/2020 EXAM: OVER-READ INTERPRETATION  CT CHEST The following report is an over-read performed by radiologist Dr. Jeronimo Greaves of [facility] on 09/15/2020. This over-read does not include interpretation of cardiac or coronary anatomy or pathology. The coronary CTA interpretation by the cardiologist is attached. COMPARISON:  Chest radiograph of earlier today. FINDINGS: Vascular: Upper normal ascending aortic caliber, including 3.9 cm on 09/10. Aortic atherosclerosis. No central pulmonary embolism, on this non-dedicated study. Mediastinum/Nodes: No imaged thoracic adenopathy. Tiny hiatal hernia. Lungs/Pleura: No pleural fluid.  Clear imaged lungs. Upper Abdomen: Moderate hepatic steatosis. Normal imaged portions of the spleen. Musculoskeletal: Mild-to-moderate convex right lower thoracic spinal curvature. IMPRESSION: 1. No acute findings in the imaged  extracardiac chest. 2. Hepatic steatosis. 3. Tiny hiatal hernia. 4. Aortic Atherosclerosis (ICD10-I70.0). Electronically Signed: By: Jeronimo Greaves M.D. On: 09/15/2020 15:51   DG Chest Port 1 View  Result Date: 09/15/2020 CLINICAL DATA:  Chest pain EXAM: PORTABLE CHEST 1 VIEW COMPARISON:  09/08/2020 FINDINGS: Cardiac enlargement without heart failure. Lungs clear without infiltrate or effusion Thoracic scoliosis.  Azygos lobe fissure noted. IMPRESSION: No active disease. Electronically Signed   By: Marlan Palau M.D.   On: 09/15/2020 12:38   ECHOCARDIOGRAM COMPLETE  Result Date: 09/15/2020    ECHOCARDIOGRAM REPORT   Patient Name:   ZULMA COURT Date of Exam: 09/15/2020 Medical Rec #:  [id number]        Height:       65.0 in Accession #:    [id number]       Weight:       201.0 lb Date of Birth:  [birth year]        BSA:          1.982 m Patient Age:    74 years         BP:           218/94 mmHg Patient Gender: F                HR:           55 bpm. Exam Location:  [address] Procedure: 2D Echo, 3D Echo, Cardiac Doppler, Color Doppler and Strain Analysis Indications:    R01.1 Murmur  History:        Patient has prior history of Echocardiogram  examinations, most                 recent 07/11/2017. Signs/Symptoms:Murmur; Risk                 Factors:Hypertension, Sleep Apnea and Dyslipidemia.  Sonographer:    Garald Braver, RDCS Referring Phys: [id number] JEFFREY C HATCHER IMPRESSIONS  1. Left ventricular ejection fraction, by estimation, is 60 to 65%. The left ventricle has normal function. The left ventricle has no regional wall motion abnormalities. There is moderate left ventricular hypertrophy. Left ventricular diastolic parameters are consistent with Grade I diastolic dysfunction (impaired relaxation). Elevated left atrial pressure. The average left ventricular global longitudinal strain is -20.4 %. The global longitudinal strain is normal.  2. Right ventricular systolic function is normal. The right ventricular  size is normal.  3. The mitral valve is normal in structure. Trivial mitral valve regurgitation. No evidence of mitral stenosis.  4. The aortic valve is tricuspid. Aortic valve regurgitation is not visualized. Mild aortic valve stenosis.  5. Aortic dilatation noted. There is mild dilatation of the ascending aorta, measuring 43 mm.  6. The inferior vena cava is normal in size with greater than 50% respiratory variability, suggesting right atrial pressure of 3 mmHg. Comparison(s): 07/11/17 EF 60-65%. PA pressure . FINDINGS  Left Ventricle: Left ventricular ejection fraction, by estimation, is 60 to 65%. The left ventricle has normal function. The left ventricle has no regional wall motion abnormalities. The average left ventricular global longitudinal strain is -20.4 %. The global longitudinal strain is normal. The left ventricular internal cavity size was normal in size. There is moderate left ventricular hypertrophy. Left ventricular diastolic parameters are consistent with Grade I diastolic dysfunction (impaired relaxation). Elevated left atrial pressure. Right Ventricle: The right ventricular size is normal. Right ventricular systolic function is normal. Left Atrium: Left atrial size was normal in size. Right Atrium: Right atrial size was normal in size. Pericardium: There is no evidence of pericardial effusion. Mitral Valve: The mitral valve is normal in structure. Trivial mitral valve regurgitation. No evidence of mitral valve stenosis. Tricuspid Valve: The tricuspid valve is normal in structure. Tricuspid valve regurgitation is mild . No evidence of tricuspid stenosis. Aortic Valve: The aortic valve is tricuspid. Aortic valve regurgitation is not visualized. Mild aortic stenosis is present. Aortic valve mean gradient measures 10.7 mmHg. Aortic valve peak gradient measures 20.0 mmHg. Aortic valve area, by VTI measures 2.50 cm. Pulmonic Valve: The pulmonic valve was normal in structure. Pulmonic valve  regurgitation is trivial. No evidence of pulmonic stenosis. Aorta: Aortic dilatation noted. There is mild dilatation of the ascending aorta, measuring 43 mm. Venous: The inferior vena cava is normal in size with greater than 50% respiratory variability, suggesting right atrial pressure of 3 mmHg. IAS/Shunts: No atrial level shunt detected by color flow Doppler.  LEFT VENTRICLE PLAX 2D LVIDd:         4.80 cm  Diastology LVIDs:         2.30 cm  LV e' medial:    4.46 cm/s LV PW:         1.20 cm  LV E/e' medial:  17.5 LV IVS:        1.20 cm  LV e' lateral:   5.11 cm/s LVOT diam:     2.10 cm  LV E/e' lateral: 15.3 LV SV:         135 LV SV Index:   68       2D Longitudinal Strain LVOT  Area:     3.46 cm 2D Strain GLS (A2C):   -23.5 %                         2D Strain GLS (A3C):   -19.9 %                         2D Strain GLS (A4C):   -17.7 %                         2D Strain GLS Avg:     -20.4 %                          3D Volume EF:                         3D EF:        59 %                         LV EDV:       118 ml                         LV ESV:       48 ml                         LV SV:        70 ml RIGHT VENTRICLE RV Basal diam:  3.60 cm RV S prime:     14.70 cm/s TAPSE (M-mode): 2.7 cm LEFT ATRIUM             Index       RIGHT ATRIUM           Index LA diam:        4.30 cm 2.17 cm/m  RA Pressure: 3.00 mmHg LA Vol (A2C):   53.7 ml 27.09 ml/m RA Area:     11.50 cm LA Vol (A4C):   44.3 ml 22.35 ml/m RA Volume:   23.70 ml  11.96 ml/m LA Biplane Vol: 50.0 ml 25.22 ml/m  AORTIC VALVE AV Area (Vmax):    2.12 cm AV Area (Vmean):   1.99 cm AV Area (VTI):     2.50 cm AV Vmax:           223.67 cm/s AV Vmean:          152.000 cm/s AV VTI:            0.541 m AV Peak Grad:      20.0 mmHg AV Mean Grad:      10.7 mmHg LVOT Vmax:         137.00 cm/s LVOT Vmean:        87.400 cm/s LVOT VTI:          0.390 m LVOT/AV VTI ratio: 0.72  AORTA Ao Root diam: 3.50 cm Ao Asc diam:  4.15 cm MITRAL VALVE               TRICUSPID  VALVE                            Estimated RAP:  3.00 mmHg  MV E velocity: 78.00 cm/s  SHUNTS MV A velocity: 84.40 cm/s  Systemic VTI:  0.39 m MV E/A ratio:  0.92        Systemic Diam: 2.10 cm Olga Millers MD Electronically signed by Olga Millers MD Signature Date/Time: 09/15/2020/12:53:52 PM    Final     Procedures Procedures    Medications Ordered in ED Medications  nitroGLYCERIN (NITROSTAT) SL tablet 0.8 mg (0 mg Sublingual Hold 09/15/20 1608)  iohexol (OMNIPAQUE) 350 MG/ML injection 100 mL (100 mLs Intravenous Contrast Given 09/15/20 1520)  HYDROmorphone (DILAUDID) injection 1 mg (1 mg Intravenous Given 09/15/20 1607)  HYDROmorphone (DILAUDID) injection 0.5 mg (0.5 mg Intravenous Given 09/15/20 1853)    ED Course  I have reviewed the triage vital signs and the nursing notes.  Pertinent labs & imaging results that were available during my care of the patient were reviewed by me and considered in my medical decision making (see chart for details).    MDM Rules/Calculators/A&P                          75 year old female presenting the emergency department today for evaluation of chest pain.  She is getting an echo prior to arrival and noted to have a blood pressure in the 260s per cardiology.  She was sent here for further evaluation due to her elevated blood pressure.  She was seen recently for hypertensive urgency in the ED.  She notes her blood pressure medications were recently increased.  Reviewed/interpreted labs CBC shows some mild anemia.  No leukocytosis BMP unremarkable Troponin neg, repeat trop neg  EKG - with NSR, LAE, borderline left axis deviation, borderline repolarization abnormality  CXR - shows no active disease CT head - No acute intracranial abnormality.  Mild senescent change. CT coronary CTA - as above  12:30 PM CONSULT with Dr. Salvatore Decent who evaluated the patient this AM while she was getting her ECHO. He recommended obtaining a CT coronary angiogram to  r/o CAD.   6:30 PM Discussed with Dr. Salvatore Decent he states the patient has moderate stenosis for which CT-FFR will be sent if she has a significant blockage.  We will not have the results until tomorrow.  He is in agreement with the plan for admission and cardiology will see the patient tomorrow.  Had a discussion with the patient about admission for further observation pending results of her CT coronary angiogram and she is in agreement with the plan for admission at this time.  Recommends patient be n.p.o. at midnight as she may go for cath.  7:39 PM CONSULT with Dr. Leafy Half who accepts patient for admission    Final Clinical Impression(s) / ED Diagnoses Final diagnoses:  Hypertensive urgency  Chest pain, unspecified type    Rx / DC Orders ED Discharge Orders     None        Rayne Du 09/15/20 1946    Koleen Distance, MD 09/16/20 [id number]

## 2020-09-15 NOTE — Progress Notes (Signed)
Urgent findings reported to Dr. Salvatore Decent Urgent findings reported time 10:15 Urgent findings acknowledge Yes Disposition of patient at discharge (if patient is discharged) Patient is being transferred to [facility] ED.

## 2020-09-15 NOTE — H&P (View-Only) (Signed)
Cardiology Admission History and Physical:   Patient ID: [name] MRN: [id number]; DOB: 29-Apr-1945   Admission date: 09/15/2020  PCP:  [facility]   [facility] Providers Cardiologist:  None        Chief Complaint:  Chest Pain.  Patient Profile:   Jennifer [name] is a 75 y.o. female with a hx of hypertension, PVC, HLD, chronic pain syndrome with lumbar stenossi, who presents as an urgent visit for HTN emergency found to had mLAD blockage  History of Present Illness:   Jennifer [name] noted this AM that she was feeling terrible; like something isn't right. Discomfort occurred as chest burning with no radiation that feels different than her usually GERD.  Biggest trigger is with her elevated blood pressure, and improved with rest.  Patient exertion notable for no exertion related to lumbar stenosis..  No shortness of breath, DOE .  No PND or orthopnea.  No bendopnea, weight gain, leg swelling , or abdominal swelling.  No syncope or near syncope . Notes  no palpitations or funny heart beats.  Has had chest pain with elevated blood pressure both at 09/08/20 ED visit (negative troponin); at 09/14/20 EMS visit, and today.  Did not want to go to ED 09/14/20 but continued to have active chest pain at home.  Sent to ED after SBP 260 with chest pain at [facility].  CCTA ordered.  Patient had main improvement with morphine.  Notes that here ASA allergy was only with GI upset.  SBP 177 at PM evaluation with improved sx but still some CP.  CCTA with FFR performed showing LAD disease.   Past Medical History:  Diagnosis Date   Acute meniscal tear of left knee    FOLLOWED BY DR GIAFFREY   BPPV (benign paroxysmal positional vertigo)    Chronic bladder pain    Chronic fatigue syndrome    Chronic low back pain    W/ RIGHT LEG PAIN AND NUMBNESS   Cystitis, chronic    Fibromyalgia    GERD (gastroesophageal reflux disease)    Hiatal hernia    Hypertension    IC  (interstitial cystitis)    OSA (obstructive sleep apnea)    per pt study yrs ago-- moderate osa ,  intolerant cpap   Pinched vertebral nerve    bilateral L2 -- L3 and L3 -- L4-/  epidural injection's treatment , PT and pain clince   PONV (postoperative nausea and vomiting)    severe   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    Self-catheterizes urinary bladder    Spinal stenosis, lumbar region with neurogenic claudication    Wears glasses    Wears partial dentures    upper and lower    Past Surgical History:  Procedure Laterality Date   ABDOMINAL HYSTERECTOMY  1980   w/ Left Salpingoophorectomy   CARDIAC CATHETERIZATION  08-21-2001  dr Verdis Prime   normal coronary arteries and LVF   CARPAL TUNNEL RELEASE Bilateral 1986   CECALCYSTOPLASTY/  APPENDECTOMY  1984   at [facility]   CHOLECYSTECTOMY  1992   CYSTO WITH HYDRODISTENSION N/A 01/12/2016   Procedure: CYSTOSCOPY/HYDRODISTENSION;  Surgeon: Jethro Bolus, MD;  Location: [facility];  Service: Urology;  Laterality: N/A;   CYSTO WITH HYDRODISTENSION N/A 08/30/2016   Procedure: CYSTOSCOPY/HYDRODISTENSION OF BLADDER INJECTION OF MARCAINE/PYRIDIUM;  Surgeon: Jethro Bolus, MD;  Location: [facility];  Service: Urology;  Laterality:  N/A;   MULTIPLE CYSTO/  HYDRODISTENTION/  INSTILLATION THERAPY  last one 08-19-2009   NASAL SINUS SURGERY  1987   TONSILLECTOMY  as child   VAGINAL GROWTH REMOVED  as teen     Medications Prior to Admission: Prior to Admission medications   Medication Sig Start Date End Date Taking? Authorizing Provider  acetaminophen (TYLENOL) 500 MG tablet Take 500 mg by mouth 2 (two) times daily as needed (for pain).   Yes [provider]  amoxicillin-clavulanate (AUGMENTIN) 500-125 MG tablet Take 1 tablet by mouth 3 (three) times daily. 09/05/20  Yes [provider]  D-Mannose 500 MG CAPS Take 500-1,500 mg by mouth 3 (three)  times daily.   Yes [provider]  dextromethorphan-guaiFENesin (MUCINEX DM) 30-600 MG 12hr tablet Take 1 tablet by mouth 2 (two) times daily as needed for cough.   Yes [provider]  fexofenadine (ALLEGRA) 60 MG tablet Take 60 mg by mouth See admin instructions. Take 60 mg by mouth one to two times a day as needed for allergy symptoms 01/06/14  Yes [provider]  fluconazole (DIFLUCAN) 100 MG tablet Take 1 tablet (100 mg total) by mouth daily. Patient taking differently: Take 100 mg by mouth daily as needed (AS DIRECTED- when taking antibiotics). 08/04/20  Yes Ginnie Smart, MD  HYDROcodone-acetaminophen (NORCO) 7.5-325 MG tablet Take 1 tablet by mouth See admin instructions. Take 1 tablet by mouth three to four times a day 02/07/15  Yes [provider]  ipratropium (ATROVENT) 0.06 % nasal spray Place 2 sprays into both nostrils 2 (two) times daily as needed for rhinitis.   Yes [provider]  losartan (COZAAR) 100 MG tablet Take 100 mg by mouth daily.   Yes [provider]  meclizine (ANTIVERT) 25 MG tablet Take 25 mg by mouth 3 (three) times daily as needed for dizziness.   Yes [provider]  Meth-Hyo-M Bl-Benz Acd-Ph Sal 81.6 MG TABS Take 1 tablet (81.6 mg total) by mouth 2 (two) times daily as needed. Patient taking differently: Take 1 tablet by mouth 2 (two) times daily as needed (for strong spasms and burning). 06/07/17  Yes Ginnie Smart, MD  nebivolol (BYSTOLIC) 5 MG tablet Take 10 mg by mouth daily.   Yes [provider]  nystatin cream (MYCOSTATIN) Apply topically 2 (two) times daily. 08/04/20  Yes Ginnie Smart, MD  omeprazole (PRILOSEC) 20 MG capsule Take 20 mg by mouth See admin instructions. Take 20 mg by mouth in the morning one hour before breakfast and an additional 20 mg before evening meal as needed for GERD 02/08/14  Yes [provider]  ondansetron (ZOFRAN-ODT) 4 MG disintegrating tablet  Take 4 mg by mouth every 8 (eight) hours as needed for nausea (dissolve orally). 07/01/17  Yes [provider]  sucralfate (CARAFATE) 1 G tablet Take 1 g by mouth See admin instructions. Take 1 gram by mouth in the morning and an additional 1 gram once a day for flares   Yes [provider]  triamcinolone cream (KENALOG) 0.1 % Apply 1 application topically daily as needed (for eczema). 02/03/15  Yes [provider]  amoxicillin-clavulanate (AUGMENTIN) 875-125 MG tablet Take 1 tablet by mouth 2 (two) times daily. 07/26/20   [provider]  augmented betamethasone dipropionate (DIPROLENE-AF) 0.05 % cream 1 application 08/12/19   [provider]  COVID-19 mRNA vaccine, Moderna, 100 MCG/0.5ML injection INJECT AS DIRECTED Patient not taking: Reported on 09/15/2020 02/01/20 01/31/21  Drue Second,  Aram Beecham, MD  DENTA 5000 PLUS 1.1 % CREA dental cream  09/16/17   [provider]  diazepam (VALIUM) 5 MG tablet Take 5 mg by mouth See admin instructions. Take 5 mg by mouth prior to procedures 07/03/19   [provider]  Fluoxetine HCl, PMDD, 10 MG TABS Take by mouth.    [provider]  fluticasone (FLONASE) 50 MCG/ACT nasal spray USE 2 SPRAYS INTO EACH NOSTRIL AT NIGHT 09/16/17   [provider]  fosfomycin (MONUROL) 3 g PACK Take by mouth. 08/10/19   [provider]  hydrocortisone (ANUSOL-HC) 25 MG suppository UNWRAP AND INSERT 1 SUPPOSITORY RECTALLY TWICE A DAY AS NEEDED Patient not taking: No sig reported 05/26/17   [provider]  losartan (COZAAR) 50 MG tablet Take 100 mg by mouth daily.  08/12/17   [provider]  Methen-Hyosc-Meth Blue-Na Phos (UROGESIC-BLUE) 81.6 MG TABS TAKE 1 TABLET BY MOUTH EVERY 6 HOURS AS NEEDED BURNING 04/17/19   [provider]  methenamine (HIPREX) 1 G tablet Take by mouth as directed. TAKES WHEN NOT TAKING ANTIBIOTICS    [provider]  methocarbamol (ROBAXIN) 500 MG  tablet Take 1 tablet by mouth 3 (three) times daily as needed for muscle spasms. Patient not taking: Reported on 09/15/2020 03/10/20   [provider]  nebivolol (BYSTOLIC) 10 MG tablet Take 5 mg by mouth every morning.  01/08/14   [provider]  pentosan polysulfate (ELMIRON) 100 MG capsule 100 mg. WEEKLY PRN BLADDER INSTILLATION AT DR Patsi Sears OFFICE Patient not taking: No sig reported    [provider]  Probiotic Product (PROBIOTIC DAILY) CAPS 1 capsule Patient not taking: Reported on 09/15/2020 10/15/17   [provider]  promethazine (PHENERGAN) 25 MG tablet 1 tablet every six (6) hours as needed. 02/08/14   [provider]     Allergies:    Allergies  Allergen Reactions   Moxifloxacin Hives, Shortness Of Breath, Swelling and Rash    Rash was severe, caused tremors, ans skin became BLOODY RED   Quinolones Anaphylaxis   Sulfa Antibiotics Hives and Rash   Furosemide Other (See Comments)    CANNOT TAKE DUE TO Interstitial cystitis   Aspirin Nausea Only and Other (See Comments)    GI and stomach upset   Duloxetine Hcl Nausea Only and Other (See Comments)    Vertigo and heart racing also    Hydrochlorothiazide Other (See Comments)    Headaches    Ibuprofen Nausea Only and Other (See Comments)    GI upset   Other Other (See Comments)    Has GERD- NO SPICY FOODS!!   Robaxin [Methocarbamol] Other (See Comments)    "Make me feel flu-like symptoms"   Topiramate Itching   Zofran [Ondansetron Hcl] Other (See Comments)    Headaches    Codeine Hives, Nausea And Vomiting and Rash   Dimethyl Sulfoxide Hives and Itching   Macrodantin [Nitrofurantoin Macrocrystal] Itching, Nausea And Vomiting and Rash    Abdominal and chest pain, also- blood-red skin, also   Nitrofurantoin Diarrhea, Itching, Rash and Other (See Comments)    GI/stomach upset, sweating, chest pain, and skin turned bloody red   Tape Rash   Yellow Dye Rash    Social History:    Social History   Socioeconomic History   Marital status: Single    Spouse name: Not on file   Number of children: 0   Years of education: College   Highest education level: Not on file  Occupational History   Occupation: n/a  Tobacco Use   Smoking status: Never   Smokeless tobacco: Never  Vaping Use   Vaping Use: Never used  Substance and Sexual Activity   Alcohol use: No    Alcohol/week: 0.0 standard drinks   Drug use: No   Sexual activity: Not on file  Other Topics Concern   Not on file  Social History Narrative   Lives alone   Caffeine use: 2 glasses tea/day   Social Determinants of Health   Financial Resource Strain: Not on file  Food Insecurity: Not on file  Transportation Needs: Not on file  Physical Activity: Not on file  Stress: Not on file  Social Connections: Not on file  Intimate Partner Violence: Not on file    Family History:   The patient's family history includes Alzheimer's disease in her father; Asthma in her father; CAD in her father; COPD in her father; Cancer in her brother; Colonic polyp in her mother; Emphysema in her father; Heart failure in her mother; Hypertension in her father and mother; Peripheral vascular disease in her mother; Stroke in her mother.    ROS:  Please see the history of present illness.  All other ROS reviewed and negative.     Physical Exam/Data:   Vitals:   09/15/20 1738 09/15/20 1739 09/15/20 1815 09/15/20 1855  BP:   (!) 160/69 (!) 168/72  Pulse: (!) 53 (!) 56 (!) 59 (!) 50  Resp: 17 20 16 19   Temp:      TempSrc:      SpO2: 97% 96% 94% 97%  Weight:      Height:       No intake or output data in the 24 hours ending 09/15/20 2018 Last 3 Weights 09/15/2020 09/08/2020 08/04/2020  Weight (lbs) 201 lb 201 lb 194 lb  Weight (kg) 91.173 kg 91.173 kg 87.998 kg     Body mass index is 33.45 kg/m.  General:  Well nourished, well developed, in no acute distress HEENT: normal Lymph: no adenopathy Neck: no JVD Endocrine:   No thryomegaly Vascular: No carotid bruits; FA pulses 2+ bilaterally without bruits  Cardiac:  normal S1, S2; RRR; no murmur +2 Radial and Femoral bilaterally Lungs:  clear to auscultation bilaterally, no wheezing, rhonchi or rales  Abd: soft, nontender, no hepatomegaly  Ext: no edema Musculoskeletal:  No deformities, BUE and BLE strength normal and equal Skin: warm and dry  Neuro:  CNs 2-12 intact, no focal abnormalities noted Psych:  Normal affect    EKG:  The ECG that was done  was personally reviewed and demonstrates Sinus Bradycardia rate 55  Relevant CV Studies:   Cardiac CT Date: 09/15/20 Results: IMPRESSION: 1. Coronary calcium score of 157. This was 74th percentile for age, sex, and race matched control.   2. Normal coronary origin with right dominance.   3. CAD-RADS 3. Moderate stenosis. Consider symptom-guided anti-ischemic pharmacotherapy as well as risk factor modification per guideline directed care. Additional analysis with CT FFR will be submitted.   4. Ascending aorta is mildly dilation for age and body surface area: 40 mm.   5. Mild dilation of the pulmonary artery.   6. Cannot exclude small patent foramen ovale.  Laboratory Data:  High Sensitivity Troponin:   Recent Labs  Lab 09/08/20 1942 09/08/20 2251 09/15/20 1218 09/15/20 1418  TROPONINIHS 15 11 16 13       Chemistry Recent Labs  Lab 09/15/20 1218  NA 140  K 3.6  CL  106  CO2 24  GLUCOSE 101*  BUN 8  CREATININE 0.71  CALCIUM 8.6*  GFRNONAA >60  ANIONGAP 10    No results for input(s): PROT, ALBUMIN, AST, ALT, ALKPHOS, BILITOT in the last 168 hours. Hematology Recent Labs  Lab 09/15/20 1218  WBC 7.4  RBC 4.05  HGB 11.6*  HCT 35.7*  MCV 88.1  MCH 28.6  MCHC 32.5  RDW 12.9  PLT 258   BNPNo results for input(s): BNP, PROBNP in the last 168 hours.  DDimer No results for input(s): DDIMER in the last 168 hours.   Radiology/Studies:  CT Head Wo Contrast  Result Date:  09/15/2020 CLINICAL DATA:  Headache EXAM: CT HEAD WITHOUT CONTRAST TECHNIQUE: Contiguous axial images were obtained from the base of the skull through the vertex without intravenous contrast. COMPARISON:  None. FINDINGS: Brain: Normal anatomic configuration. Parenchymal volume loss is commensurate with the patient's age. Mild periventricular white matter changes are present likely reflecting the sequela of small vessel ischemia. No abnormal intra or extra-axial mass lesion or fluid collection. No abnormal mass effect or midline shift. No evidence of acute intracranial hemorrhage or infarct. Ventricular size is normal. Cerebellum unremarkable. Vascular: No asymmetric hyperdense vasculature at the skull base. Skull: Intact Sinuses/Orbits: Mucous retention cyst noted within the right maxillary sinus. Remaining paranasal sinuses are clear. Orbits are unremarkable. Other: Mastoid air cells and middle ear cavities are clear. IMPRESSION: No acute intracranial abnormality.  Mild senescent change. Electronically Signed   By: Helyn Numbers MD   On: 09/15/2020 15:53   CT CORONARY MORPH W/CTA COR W/SCORE W/CA W/CM &/OR WO/CM  Addendum Date: 09/15/2020   ADDENDUM REPORT: 09/15/2020 18:26 CLINICAL DATA:  75 Year-old Caucasian Female EXAM: Cardiac/Coronary  CTA TECHNIQUE: The patient was scanned on a [employer]. FINDINGS: A 100 kV prospective scan was triggered in the descending thoracic aorta at 111 HU's. Axial non-contrast 3 mm slices were carried out through the heart. The data set was analyzed on a dedicated work station and scored using the Agatson method. Gantry rotation speed was 250 msecs and collimation was .6 mm. No beta blockade and 0.8 mg of sl NTG was given. The 3D data set was reconstructed in 5% intervals of the 67-82 % of the R-R cycle. Diastolic phases were analyzed on a dedicated work station using MPR, MIP and VRT modes. The patient received 100 cc of contrast. Aorta: Ascending aorta is mildly  dilated for age and body surface area: 40 mm. No calcifications in cardiac field of view. No dissection. Main Pulmonary Artery: Mild dilation of the pulmonary artery: 32 mm with artery to aorta ratio of < 0.9. Aortic Valve:  Tri-leaflet.  Mild annular calcification. Coronary Arteries:  Normal coronary origin.  Right dominance. Coronary Calcium Score: Left main: 0 Left anterior descending artery: 156 Left circumflex artery: 0 Right coronary artery: 1 Total: 157 Percentile: 74th for age, sex, and race matched control. RCA is a large dominant artery that gives rise to PDA and PLA. There is no plaque. Left main is a large artery that gives rise to LAD and LCX arteries. There is no plaque. LAD is a large vessel that gives rise to one small D1 Branch. There is a mild non obstructive stenosis (25-49%) calcific plaque in the proximal LAD. There is a moderate stenosis (50-70%) calcific plaque in the mid LAD. There is a moderate stenosis (50-70%) calcific plaque in the distal LAD. LCX is a non-dominant artery.  There is no plaque. There is a  ramus intermedius vessel with minimal non-obstructive (1-24%) soft plaque in the mid vessel. Other findings: Normal pulmonary vein drainage into the left atrium. Normal left atrial appendage without a thrombus. Cannot exclude small patent foramen ovale. Extra-cardiac findings: See attached radiology report for non-cardiac structures. IMPRESSION: 1. Coronary calcium score of 157. This was 74th percentile for age, sex, and race matched control. 2. Normal coronary origin with right dominance. 3. CAD-RADS 3. Moderate stenosis. Consider symptom-guided anti-ischemic pharmacotherapy as well as risk factor modification per guideline directed care. Additional analysis with CT FFR will be submitted. 4. Ascending aorta is mildly dilation for age and body surface area: 40 mm. 5. Mild dilation of the pulmonary artery. 6. Cannot exclude small patent foramen ovale. Reaching out to primary team.  RECOMMENDATIONS: Coronary artery calcium (CAC) score is a strong predictor of incident coronary heart disease (CHD) and provides predictive information beyond traditional risk factors. CAC scoring is reasonable to use in the decision to withhold, postpone, or initiate statin therapy in intermediate-risk or selected borderline-risk asymptomatic adults (age 73-75 years and LDL-C >=70 to <190 mg/dL) who do not have diabetes or established atherosclerotic cardiovascular disease (ASCVD).* In intermediate-risk (10-year ASCVD risk >=7.5% to <20%) adults or selected borderline-risk (10-year ASCVD risk >=5% to <7.5%) adults in whom a CAC score is measured for the purpose of making a treatment decision the following recommendations have been made: If CAC = 0, it is reasonable to withhold statin therapy and reassess in 5 to 10 years, as long as higher risk conditions are absent (diabetes mellitus, family history of premature CHD in first degree relatives (males <55 years; females <65 years), cigarette smoking, LDL >=190 mg/dL or other independent risk factors). If CAC is 1 to 99, it is reasonable to initiate statin therapy for patients ?73 years of age. If CAC is >=100 or >=75th percentile, it is reasonable to initiate statin therapy at any age. Cardiology referral should be considered for patients with CAC scores =400 or >=75th percentile. *2018 AHA/ACC/AACVPR/AAPA/ABC/ACPM/ADA/AGS/APhA/ASPC/NLA/PCNA Guideline on the Management of Blood Cholesterol: A Report of the American College of Cardiology/American Heart Association Task Force on Clinical Practice Guidelines. J Am Coll Cardiol. 2019;73(24):3168-3209. Riley LamMahesh Koren Sermersheim, MD Electronically Signed   By: Riley LamMahesh   Alexis Reber MD   On: 09/15/2020 18:26   Result Date: 09/15/2020 EXAM: OVER-READ INTERPRETATION  CT CHEST The following report is an over-read performed by radiologist Dr. Jeronimo GreavesKyle Talbot [facility] on 09/15/2020. This over-read does not include  interpretation of cardiac or coronary anatomy or pathology. The coronary CTA interpretation by the cardiologist is attached. COMPARISON:  Chest radiograph of earlier today. FINDINGS: Vascular: Upper normal ascending aortic caliber, including 3.9 cm on 09/10. Aortic atherosclerosis. No central pulmonary embolism, on this non-dedicated study. Mediastinum/Nodes: No imaged thoracic adenopathy. Tiny hiatal hernia. Lungs/Pleura: No pleural fluid.  Clear imaged lungs. Upper Abdomen: Moderate hepatic steatosis. Normal imaged portions of the spleen. Musculoskeletal: Mild-to-moderate convex right lower thoracic spinal curvature. IMPRESSION: 1. No acute findings in the imaged extracardiac chest. 2. Hepatic steatosis. 3. Tiny hiatal hernia. 4. Aortic Atherosclerosis (ICD10-I70.0). Electronically Signed: By: Jeronimo GreavesKyle  Talbot M.D. On: 09/15/2020 15:51   DG Chest Port 1 View  Result Date: 09/15/2020 CLINICAL DATA:  Chest pain EXAM: PORTABLE CHEST 1 VIEW COMPARISON:  09/08/2020 FINDINGS: Cardiac enlargement without heart failure. Lungs clear without infiltrate or effusion Thoracic scoliosis.  Azygos lobe fissure noted. IMPRESSION: No active disease. Electronically Signed   By: Marlan Palauharles  Clark M.D.   On: 09/15/2020 12:38   ECHOCARDIOGRAM  COMPLETE  Result Date: 09/15/2020    ECHOCARDIOGRAM REPORT   Patient Name:   Jennifer [name] Date of Exam: 09/15/2020 Medical Rec #:  [id number]        Height:       65.0 in Accession #:    [id number]       Weight:       201.0 lb Date of Birth:  02-Apr-1946        BSA:          1.982 m Patient Age:    74 years         BP:           218/94 mmHg Patient Gender: F                HR:           55 bpm. Exam Location:  [address] Procedure: 2D Echo, 3D Echo, Cardiac Doppler, Color Doppler and Strain Analysis Indications:    R01.1 Murmur  History:        Patient has prior history of Echocardiogram examinations, most                 recent 07/11/2017. Signs/Symptoms:Murmur; Risk                  Factors:Hypertension, Sleep Apnea and Dyslipidemia.  Sonographer:    Garald Braver, RDCS Referring Phys: [id number] JEFFREY C HATCHER IMPRESSIONS  1. Left ventricular ejection fraction, by estimation, is 60 to 65%. The left ventricle has normal function. The left ventricle has no regional wall motion abnormalities. There is moderate left ventricular hypertrophy. Left ventricular diastolic parameters are consistent with Grade I diastolic dysfunction (impaired relaxation). Elevated left atrial pressure. The average left ventricular global longitudinal strain is -20.4 %. The global longitudinal strain is normal.  2. Right ventricular systolic function is normal. The right ventricular size is normal.  3. The mitral valve is normal in structure. Trivial mitral valve regurgitation. No evidence of mitral stenosis.  4. The aortic valve is tricuspid. Aortic valve regurgitation is not visualized. Mild aortic valve stenosis.  5. Aortic dilatation noted. There is mild dilatation of the ascending aorta, measuring 43 mm.  6. The inferior vena cava is normal in size with greater than 50% respiratory variability, suggesting right atrial pressure of 3 mmHg. Comparison(s): 07/11/17 EF 60-65%. PA pressure . FINDINGS  Left Ventricle: Left ventricular ejection fraction, by estimation, is 60 to 65%. The left ventricle has normal function. The left ventricle has no regional wall motion abnormalities. The average left ventricular global longitudinal strain is -20.4 %. The global longitudinal strain is normal. The left ventricular internal cavity size was normal in size. There is moderate left ventricular hypertrophy. Left ventricular diastolic parameters are consistent with Grade I diastolic dysfunction (impaired relaxation). Elevated left atrial pressure. Right Ventricle: The right ventricular size is normal. Right ventricular systolic function is normal. Left Atrium: Left atrial size was normal in size. Right Atrium: Right atrial size was  normal in size. Pericardium: There is no evidence of pericardial effusion. Mitral Valve: The mitral valve is normal in structure. Trivial mitral valve regurgitation. No evidence of mitral valve stenosis. Tricuspid Valve: The tricuspid valve is normal in structure. Tricuspid valve regurgitation is mild . No evidence of tricuspid stenosis. Aortic Valve: The aortic valve is tricuspid. Aortic valve regurgitation is not visualized. Mild aortic stenosis is present. Aortic valve mean gradient measures 10.7 mmHg. Aortic valve peak gradient measures 20.0 mmHg. Aortic valve area,  by VTI measures 2.50 cm. Pulmonic Valve: The pulmonic valve was normal in structure. Pulmonic valve regurgitation is trivial. No evidence of pulmonic stenosis. Aorta: Aortic dilatation noted. There is mild dilatation of the ascending aorta, measuring 43 mm. Venous: The inferior vena cava is normal in size with greater than 50% respiratory variability, suggesting right atrial pressure of 3 mmHg. IAS/Shunts: No atrial level shunt detected by color flow Doppler.  LEFT VENTRICLE PLAX 2D LVIDd:         4.80 cm  Diastology LVIDs:         2.30 cm  LV e' medial:    4.46 cm/s LV PW:         1.20 cm  LV E/e' medial:  17.5 LV IVS:        1.20 cm  LV e' lateral:   5.11 cm/s LVOT diam:     2.10 cm  LV E/e' lateral: 15.3 LV SV:         135 LV SV Index:   68       2D Longitudinal Strain LVOT Area:     3.46 cm 2D Strain GLS (A2C):   -23.5 %                         2D Strain GLS (A3C):   -19.9 %                         2D Strain GLS (A4C):   -17.7 %                         2D Strain GLS Avg:     -20.4 %                          3D Volume EF:                         3D EF:        59 %                         LV EDV:       118 ml                         LV ESV:       48 ml                         LV SV:        70 ml RIGHT VENTRICLE RV Basal diam:  3.60 cm RV S prime:     14.70 cm/s TAPSE (M-mode): 2.7 cm LEFT ATRIUM             Index       RIGHT ATRIUM            Index LA diam:        4.30 cm 2.17 cm/m  RA Pressure: 3.00 mmHg LA Vol (A2C):   53.7 ml 27.09 ml/m RA Area:     11.50 cm LA Vol (A4C):   44.3 ml 22.35 ml/m RA Volume:   23.70 ml  11.96 ml/m LA Biplane Vol: 50.0 ml 25.22 ml/m  AORTIC VALVE AV Area (Vmax):    2.12 cm AV Area (Vmean):   1.99 cm AV  Area (VTI):     2.50 cm AV Vmax:           223.67 cm/s AV Vmean:          152.000 cm/s AV VTI:            0.541 m AV Peak Grad:      20.0 mmHg AV Mean Grad:      10.7 mmHg LVOT Vmax:         137.00 cm/s LVOT Vmean:        87.400 cm/s LVOT VTI:          0.390 m LVOT/AV VTI ratio: 0.72  AORTA Ao Root diam: 3.50 cm Ao Asc diam:  4.15 cm MITRAL VALVE               TRICUSPID VALVE                            Estimated RAP:  3.00 mmHg  MV E velocity: 78.00 cm/s  SHUNTS MV A velocity: 84.40 cm/s  Systemic VTI:  0.39 m MV E/A ratio:  0.92        Systemic Diam: 2.10 cm Olga Millers MD Electronically signed by Olga Millers MD Signature Date/Time: 09/15/2020/12:53:52 PM    Final      Assessment and Plan:   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  These include bleeding, infection, kidney damage, stroke, heart attack, death.  The patient understands these risks and is willing to proceed. - starting atorvastatin 40 mg PO Daily (deferred rosuvastatin given yellow dye allergy) -potentially DC 09/16/20 based on pain mgmt, UTI sx resolution, and intervention   Risk Assessment/Risk Scores:    TIMI Risk Score for Unstable Angina or Non-ST Elevation MI:   The patient's TIMI risk score is 4, which indicates a 20% risk of all cause mortality, new or recurrent myocardial infarction or need for urgent revascularization in the next 14 days.{ Click her  For questions or updates, please contact [facility] Please consult [email or web address] for contact info under     Signed, Christell Constant, MD  09/15/2020 8:18 PM

## 2020-09-15 NOTE — Consult Note (Signed)
Cardiology Admission History and Physical:   Patient ID: Jennifer [name] MRN: [id number]; DOB: 06/27/1945   Admission date: 09/15/2020  PCP:  [facility]   [facility] Providers Cardiologist:  None        Chief Complaint:  Chest Pain.  Patient Profile:   Jennifer [name] is a 74 y.o. female with a hx of hypertension, PVC, HLD, chronic pain syndrome with lumbar stenossi, who presents as an urgent visit for HTN emergency found to had mLAD blockage  History of Present Illness:   [name] noted this AM that she was feeling terrible; like something isn't right. Discomfort occurred as chest burning with no radiation that feels different than her usually GERD.  Biggest trigger is with her elevated blood pressure, and improved with rest.  Patient exertion notable for no exertion related to lumbar stenosis..  No shortness of breath, DOE .  No PND or orthopnea.  No bendopnea, weight gain, leg swelling , or abdominal swelling.  No syncope or near syncope . Notes  no palpitations or funny heart beats.  Has had chest pain with elevated blood pressure both at 09/08/20 ED visit (negative troponin); at 09/14/20 EMS visit, and today.  Did not want to go to ED 09/14/20 but continued to have active chest pain at home.  Sent to ED after SBP 260 with chest pain at [facility].  CCTA ordered.  Patient had main improvement with morphine.  Notes that here ASA allergy was only with GI upset.  SBP 177 at PM evaluation with improved sx but still some CP.  CCTA with FFR performed showing LAD disease.   Past Medical History:  Diagnosis Date   Acute meniscal tear of left knee    FOLLOWED BY DR GIAFFREY   BPPV (benign paroxysmal positional vertigo)    Chronic bladder pain    Chronic fatigue syndrome    Chronic low back pain    W/ RIGHT LEG PAIN AND NUMBNESS   Cystitis, chronic    Fibromyalgia    GERD (gastroesophageal reflux disease)    Hiatal hernia    Hypertension    IC  (interstitial cystitis)    OSA (obstructive sleep apnea)    per pt study yrs ago-- moderate osa ,  intolerant cpap   Pinched vertebral nerve    bilateral L2 -- L3 and L3 -- L4-/  epidural injection's treatment , PT and pain clince   PONV (postoperative nausea and vomiting)    severe   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    Self-catheterizes urinary bladder    Spinal stenosis, lumbar region with neurogenic claudication    Wears glasses    Wears partial dentures    upper and lower    Past Surgical History:  Procedure Laterality Date   ABDOMINAL HYSTERECTOMY  1980   w/ Left Salpingoophorectomy   CARDIAC CATHETERIZATION  08-21-2001  dr henry smith   normal coronary arteries and LVF   CARPAL TUNNEL RELEASE Bilateral 1986   CECALCYSTOPLASTY/  APPENDECTOMY  1984   at [facility]   CHOLECYSTECTOMY  1992   CYSTO WITH HYDRODISTENSION N/A 01/12/2016   Procedure: CYSTOSCOPY/HYDRODISTENSION;  Surgeon: Sigmund Tannenbaum, MD;  Location: [facility];  Service: Urology;  Laterality: N/A;   CYSTO WITH HYDRODISTENSION N/A 08/30/2016   Procedure: CYSTOSCOPY/HYDRODISTENSION OF BLADDER INJECTION OF MARCAINE/PYRIDIUM;  Surgeon: Tannenbaum, Sigmund, MD;  Location: [facility];  Service: Urology;  Laterality:   N/A;   MULTIPLE CYSTO/  HYDRODISTENTION/  INSTILLATION THERAPY  last one 08-19-2009   NASAL SINUS SURGERY  1987   TONSILLECTOMY  as child   VAGINAL GROWTH REMOVED  as teen     Medications Prior to Admission: Prior to Admission medications   Medication Sig Start Date End Date Taking? Authorizing Provider  acetaminophen (TYLENOL) 500 MG tablet Take 500 mg by mouth 2 (two) times daily as needed (for pain).   Yes [provider]  amoxicillin-clavulanate (AUGMENTIN) 500-125 MG tablet Take 1 tablet by mouth 3 (three) times daily. 09/05/20  Yes [provider]  D-Mannose 500 MG CAPS Take 500-1,500 mg by mouth 3 (three)  times daily.   Yes [provider]  dextromethorphan-guaiFENesin (MUCINEX DM) 30-600 MG 12hr tablet Take 1 tablet by mouth 2 (two) times daily as needed for cough.   Yes [provider]  fexofenadine (ALLEGRA) 60 MG tablet Take 60 mg by mouth See admin instructions. Take 60 mg by mouth one to two times a day as needed for allergy symptoms 01/06/14  Yes [provider]  fluconazole (DIFLUCAN) 100 MG tablet Take 1 tablet (100 mg total) by mouth daily. Patient taking differently: Take 100 mg by mouth daily as needed (AS DIRECTED- when taking antibiotics). 08/04/20  Yes Hatcher, Jeffrey C, MD  HYDROcodone-acetaminophen (NORCO) 7.5-325 MG tablet Take 1 tablet by mouth See admin instructions. Take 1 tablet by mouth three to four times a day 02/07/15  Yes [provider]  ipratropium (ATROVENT) 0.06 % nasal spray Place 2 sprays into both nostrils 2 (two) times daily as needed for rhinitis.   Yes [provider]  losartan (COZAAR) 100 MG tablet Take 100 mg by mouth daily.   Yes [provider]  meclizine (ANTIVERT) 25 MG tablet Take 25 mg by mouth 3 (three) times daily as needed for dizziness.   Yes [provider]  Meth-Hyo-M Bl-Benz Acd-Ph Sal 81.6 MG TABS Take 1 tablet (81.6 mg total) by mouth 2 (two) times daily as needed. Patient taking differently: Take 1 tablet by mouth 2 (two) times daily as needed (for strong spasms and burning). 06/07/17  Yes Hatcher, Jeffrey C, MD  nebivolol (BYSTOLIC) 5 MG tablet Take 10 mg by mouth daily.   Yes [provider]  nystatin cream (MYCOSTATIN) Apply topically 2 (two) times daily. 08/04/20  Yes Hatcher, Jeffrey C, MD  omeprazole (PRILOSEC) 20 MG capsule Take 20 mg by mouth See admin instructions. Take 20 mg by mouth in the morning one hour before breakfast and an additional 20 mg before evening meal as needed for GERD 02/08/14  Yes [provider]  ondansetron (ZOFRAN-ODT) 4 MG disintegrating tablet  Take 4 mg by mouth every 8 (eight) hours as needed for nausea (dissolve orally). 07/01/17  Yes [provider]  sucralfate (CARAFATE) 1 G tablet Take 1 g by mouth See admin instructions. Take 1 gram by mouth in the morning and an additional 1 gram once a day for flares   Yes [provider]  triamcinolone cream (KENALOG) 0.1 % Apply 1 application topically daily as needed (for eczema). 02/03/15  Yes [provider]  amoxicillin-clavulanate (AUGMENTIN) 875-125 MG tablet Take 1 tablet by mouth 2 (two) times daily. 07/26/20   [provider]  augmented betamethasone dipropionate (DIPROLENE-AF) 0.05 % cream 1 application 08/12/19   [provider]  COVID-19 mRNA vaccine, Moderna, 100 MCG/0.5ML injection INJECT AS DIRECTED Patient not taking: Reported on 09/15/2020 02/01/20 01/31/21  Snider,   Cynthia, MD  DENTA 5000 PLUS 1.1 % CREA dental cream  09/16/17   [provider]  diazepam (VALIUM) 5 MG tablet Take 5 mg by mouth See admin instructions. Take 5 mg by mouth prior to procedures 07/03/19   [provider]  Fluoxetine HCl, PMDD, 10 MG TABS Take by mouth.    [provider]  fluticasone (FLONASE) 50 MCG/ACT nasal spray USE 2 SPRAYS INTO EACH NOSTRIL AT NIGHT 09/16/17   [provider]  fosfomycin (MONUROL) 3 g PACK Take by mouth. 08/10/19   [provider]  hydrocortisone (ANUSOL-HC) 25 MG suppository UNWRAP AND INSERT 1 SUPPOSITORY RECTALLY TWICE A DAY AS NEEDED Patient not taking: No sig reported 05/26/17   [provider]  losartan (COZAAR) 50 MG tablet Take 100 mg by mouth daily.  08/12/17   [provider]  Methen-Hyosc-Meth Blue-Na Phos (UROGESIC-BLUE) 81.6 MG TABS TAKE 1 TABLET BY MOUTH EVERY 6 HOURS AS NEEDED BURNING 04/17/19   [provider]  methenamine (HIPREX) 1 G tablet Take by mouth as directed. TAKES WHEN NOT TAKING ANTIBIOTICS    [provider]  methocarbamol (ROBAXIN) 500 MG  tablet Take 1 tablet by mouth 3 (three) times daily as needed for muscle spasms. Patient not taking: Reported on 09/15/2020 03/10/20   [provider]  nebivolol (BYSTOLIC) 10 MG tablet Take 5 mg by mouth every morning.  01/08/14   [provider]  pentosan polysulfate (ELMIRON) 100 MG capsule 100 mg. WEEKLY PRN BLADDER INSTILLATION AT DR TANNENBAUM OFFICE Patient not taking: No sig reported    [provider]  Probiotic Product (PROBIOTIC DAILY) CAPS 1 capsule Patient not taking: Reported on 09/15/2020 10/15/17   [provider]  promethazine (PHENERGAN) 25 MG tablet 1 tablet every six (6) hours as needed. 02/08/14   [provider]     Allergies:    Allergies  Allergen Reactions   Moxifloxacin Hives, Shortness Of Breath, Swelling and Rash    Rash was severe, caused tremors, ans skin became BLOODY RED   Quinolones Anaphylaxis   Sulfa Antibiotics Hives and Rash   Furosemide Other (See Comments)    CANNOT TAKE DUE TO Interstitial cystitis   Aspirin Nausea Only and Other (See Comments)    GI and stomach upset   Duloxetine Hcl Nausea Only and Other (See Comments)    Vertigo and heart racing also    Hydrochlorothiazide Other (See Comments)    Headaches    Ibuprofen Nausea Only and Other (See Comments)    GI upset   Other Other (See Comments)    Has GERD- NO SPICY FOODS!!   Robaxin [Methocarbamol] Other (See Comments)    "Make me feel flu-like symptoms"   Topiramate Itching   Zofran [Ondansetron Hcl] Other (See Comments)    Headaches    Codeine Hives, Nausea And Vomiting and Rash   Dimethyl Sulfoxide Hives and Itching   Macrodantin [Nitrofurantoin Macrocrystal] Itching, Nausea And Vomiting and Rash    Abdominal and chest pain, also- blood-red skin, also   Nitrofurantoin Diarrhea, Itching, Rash and Other (See Comments)    GI/stomach upset, sweating, chest pain, and skin turned bloody red   Tape Rash   Yellow Dye Rash    Social History:    Social History   Socioeconomic History   Marital status: Single    Spouse name: Not on file   Number of children: 0   Years of education: College   Highest education level: Not on file    Occupational History   Occupation: n/a  Tobacco Use   Smoking status: Never   Smokeless tobacco: Never  Vaping Use   Vaping Use: Never used  Substance and Sexual Activity   Alcohol use: No    Alcohol/week: 0.0 standard drinks   Drug use: No   Sexual activity: Not on file  Other Topics Concern   Not on file  Social History Narrative   Lives alone   Caffeine use: 2 glasses tea/day   Social Determinants of Health   Financial Resource Strain: Not on file  Food Insecurity: Not on file  Transportation Needs: Not on file  Physical Activity: Not on file  Stress: Not on file  Social Connections: Not on file  Intimate Partner Violence: Not on file    Family History:   The patient's family history includes Alzheimer's disease in her father; Asthma in her father; CAD in her father; COPD in her father; Cancer in her brother; Colonic polyp in her mother; Emphysema in her father; Heart failure in her mother; Hypertension in her father and mother; Peripheral vascular disease in her mother; Stroke in her mother.    ROS:  Please see the history of present illness.  All other ROS reviewed and negative.     Physical Exam/Data:   Vitals:   09/15/20 1738 09/15/20 1739 09/15/20 1815 09/15/20 1855  BP:   (!) 160/69 (!) 168/72  Pulse: (!) 53 (!) 56 (!) 59 (!) 50  Resp: 17 20 16 19  Temp:      TempSrc:      SpO2: 97% 96% 94% 97%  Weight:      Height:       No intake or output data in the 24 hours ending 09/15/20 2018 Last 3 Weights 09/15/2020 09/08/2020 08/04/2020  Weight (lbs) 201 lb 201 lb 194 lb  Weight (kg) 91.173 kg 91.173 kg 87.998 kg     Body mass index is 33.45 kg/m.  General:  Well nourished, well developed, in no acute distress HEENT: normal Lymph: no adenopathy Neck: no JVD Endocrine:   No thryomegaly Vascular: No carotid bruits; FA pulses 2+ bilaterally without bruits  Cardiac:  normal S1, S2; RRR; no murmur +2 Radial and Femoral bilaterally Lungs:  clear to auscultation bilaterally, no wheezing, rhonchi or rales  Abd: soft, nontender, no hepatomegaly  Ext: no edema Musculoskeletal:  No deformities, BUE and BLE strength normal and equal Skin: warm and dry  Neuro:  CNs 2-12 intact, no focal abnormalities noted Psych:  Normal affect    EKG:  The ECG that was done  was personally reviewed and demonstrates Sinus Bradycardia rate 55  Relevant CV Studies:   Cardiac CT Date: 09/15/20 Results: IMPRESSION: 1. Coronary calcium score of 157. This was 74th percentile for age, sex, and race matched control.   2. Normal coronary origin with right dominance.   3. CAD-RADS 3. Moderate stenosis. Consider symptom-guided anti-ischemic pharmacotherapy as well as risk factor modification per guideline directed care. Additional analysis with CT FFR will be submitted.   4. Ascending aorta is mildly dilation for age and body surface area: 40 mm.   5. Mild dilation of the pulmonary artery.   6. Cannot exclude small patent foramen ovale.  Laboratory Data:  High Sensitivity Troponin:   Recent Labs  Lab 09/08/20 1942 09/08/20 2251 09/15/20 1218 09/15/20 1418  TROPONINIHS 15 11 16 13      Chemistry Recent Labs  Lab 09/15/20 1218  NA 140  K 3.6  CL   106  CO2 24  GLUCOSE 101*  BUN 8  CREATININE 0.71  CALCIUM 8.6*  GFRNONAA >60  ANIONGAP 10    No results for input(s): PROT, ALBUMIN, AST, ALT, ALKPHOS, BILITOT in the last 168 hours. Hematology Recent Labs  Lab 09/15/20 1218  WBC 7.4  RBC 4.05  HGB 11.6*  HCT 35.7*  MCV 88.1  MCH 28.6  MCHC 32.5  RDW 12.9  PLT 258   BNPNo results for input(s): BNP, PROBNP in the last 168 hours.  DDimer No results for input(s): DDIMER in the last 168 hours.   Radiology/Studies:  CT Head Wo Contrast  Result Date:  09/15/2020 CLINICAL DATA:  Headache EXAM: CT HEAD WITHOUT CONTRAST TECHNIQUE: Contiguous axial images were obtained from the base of the skull through the vertex without intravenous contrast. COMPARISON:  None. FINDINGS: Brain: Normal anatomic configuration. Parenchymal volume loss is commensurate with the patient's age. Mild periventricular white matter changes are present likely reflecting the sequela of small vessel ischemia. No abnormal intra or extra-axial mass lesion or fluid collection. No abnormal mass effect or midline shift. No evidence of acute intracranial hemorrhage or infarct. Ventricular size is normal. Cerebellum unremarkable. Vascular: No asymmetric hyperdense vasculature at the skull base. Skull: Intact Sinuses/Orbits: Mucous retention cyst noted within the right maxillary sinus. Remaining paranasal sinuses are clear. Orbits are unremarkable. Other: Mastoid air cells and middle ear cavities are clear. IMPRESSION: No acute intracranial abnormality.  Mild senescent change. Electronically Signed   By: Ashesh  Parikh MD   On: 09/15/2020 15:53   CT CORONARY MORPH W/CTA COR W/SCORE W/CA W/CM &/OR WO/CM  Addendum Date: 09/15/2020   ADDENDUM REPORT: 09/15/2020 18:26 CLINICAL DATA:  74 Year-old Caucasian Female EXAM: Cardiac/Coronary  CTA TECHNIQUE: The patient was scanned on a Phillips Force scanner. FINDINGS: A 100 kV prospective scan was triggered in the descending thoracic aorta at 111 HU's. Axial non-contrast 3 mm slices were carried out through the heart. The data set was analyzed on a dedicated work station and scored using the Agatson method. Gantry rotation speed was 250 msecs and collimation was .6 mm. No beta blockade and 0.8 mg of sl NTG was given. The 3D data set was reconstructed in 5% intervals of the 67-82 % of the R-R cycle. Diastolic phases were analyzed on a dedicated work station using MPR, MIP and VRT modes. The patient received 100 cc of contrast. Aorta: Ascending aorta is mildly  dilated for age and body surface area: 40 mm. No calcifications in cardiac field of view. No dissection. Main Pulmonary Artery: Mild dilation of the pulmonary artery: 32 mm with artery to aorta ratio of < 0.9. Aortic Valve:  Tri-leaflet.  Mild annular calcification. Coronary Arteries:  Normal coronary origin.  Right dominance. Coronary Calcium Score: Left main: 0 Left anterior descending artery: 156 Left circumflex artery: 0 Right coronary artery: 1 Total: 157 Percentile: 74th for age, sex, and race matched control. RCA is a large dominant artery that gives rise to PDA and PLA. There is no plaque. Left main is a large artery that gives rise to LAD and LCX arteries. There is no plaque. LAD is a large vessel that gives rise to one small D1 Branch. There is a mild non obstructive stenosis (25-49%) calcific plaque in the proximal LAD. There is a moderate stenosis (50-70%) calcific plaque in the mid LAD. There is a moderate stenosis (50-70%) calcific plaque in the distal LAD. LCX is a non-dominant artery.  There is no plaque. There is a   ramus intermedius vessel with minimal non-obstructive (1-24%) soft plaque in the mid vessel. Other findings: Normal pulmonary vein drainage into the left atrium. Normal left atrial appendage without a thrombus. Cannot exclude small patent foramen ovale. Extra-cardiac findings: See attached radiology report for non-cardiac structures. IMPRESSION: 1. Coronary calcium score of 157. This was 74th percentile for age, sex, and race matched control. 2. Normal coronary origin with right dominance. 3. CAD-RADS 3. Moderate stenosis. Consider symptom-guided anti-ischemic pharmacotherapy as well as risk factor modification per guideline directed care. Additional analysis with CT FFR will be submitted. 4. Ascending aorta is mildly dilation for age and body surface area: 40 mm. 5. Mild dilation of the pulmonary artery. 6. Cannot exclude small patent foramen ovale. Reaching out to primary team.  RECOMMENDATIONS: Coronary artery calcium (CAC) score is a strong predictor of incident coronary heart disease (CHD) and provides predictive information beyond traditional risk factors. CAC scoring is reasonable to use in the decision to withhold, postpone, or initiate statin therapy in intermediate-risk or selected borderline-risk asymptomatic adults (age 40-75 years and LDL-C >=70 to <190 mg/dL) who do not have diabetes or established atherosclerotic cardiovascular disease (ASCVD).* In intermediate-risk (10-year ASCVD risk >=7.5% to <20%) adults or selected borderline-risk (10-year ASCVD risk >=5% to <7.5%) adults in whom a CAC score is measured for the purpose of making a treatment decision the following recommendations have been made: If CAC = 0, it is reasonable to withhold statin therapy and reassess in 5 to 10 years, as long as higher risk conditions are absent (diabetes mellitus, family history of premature CHD in first degree relatives (males <55 years; females <65 years), cigarette smoking, LDL >=190 mg/dL or other independent risk factors). If CAC is 1 to 99, it is reasonable to initiate statin therapy for patients ?73 years of age. If CAC is >=100 or >=75th percentile, it is reasonable to initiate statin therapy at any age. Cardiology referral should be considered for patients with CAC scores =400 or >=75th percentile. *2018 AHA/ACC/AACVPR/AAPA/ABC/ACPM/ADA/AGS/APhA/ASPC/NLA/PCNA Guideline on the Management of Blood Cholesterol: A Report of the American College of Cardiology/American Heart Association Task Force on Clinical Practice Guidelines. J Am Coll Cardiol. 2019;73(24):3168-3209. Tanajah Boulter, MD Electronically Signed   By: Frank Pilger MD   On: 09/15/2020 18:26   Result Date: 09/15/2020 EXAM: OVER-READ INTERPRETATION  CT CHEST The following report is an over-read performed by radiologist Dr. Kyle Talbot of [facility] on 09/15/2020. This over-read does not include  interpretation of cardiac or coronary anatomy or pathology. The coronary CTA interpretation by the cardiologist is attached. COMPARISON:  Chest radiograph of earlier today. FINDINGS: Vascular: Upper normal ascending aortic caliber, including 3.9 cm on 09/10. Aortic atherosclerosis. No central pulmonary embolism, on this non-dedicated study. Mediastinum/Nodes: No imaged thoracic adenopathy. Tiny hiatal hernia. Lungs/Pleura: No pleural fluid.  Clear imaged lungs. Upper Abdomen: Moderate hepatic steatosis. Normal imaged portions of the spleen. Musculoskeletal: Mild-to-moderate convex right lower thoracic spinal curvature. IMPRESSION: 1. No acute findings in the imaged extracardiac chest. 2. Hepatic steatosis. 3. Tiny hiatal hernia. 4. Aortic Atherosclerosis (ICD10-I70.0). Electronically Signed: By: Kyle  Talbot M.D. On: 09/15/2020 15:51   DG Chest Port 1 View  Result Date: 09/15/2020 CLINICAL DATA:  Chest pain EXAM: PORTABLE CHEST 1 VIEW COMPARISON:  09/08/2020 FINDINGS: Cardiac enlargement without heart failure. Lungs clear without infiltrate or effusion Thoracic scoliosis.  Azygos lobe fissure noted. IMPRESSION: No active disease. Electronically Signed   By: Charles  Clark M.D.   On: 09/15/2020 12:38   ECHOCARDIOGRAM   COMPLETE  Result Date: 09/15/2020    ECHOCARDIOGRAM REPORT   Patient Name:   Keyah E Melaragno Date of Exam: 09/15/2020 Medical Rec #:  [id number]        Height:       65.0 in Accession #:    [id number]       Weight:       201.0 lb Date of Birth:  10/07/1945        BSA:          1.982 m Patient Age:    74 years         BP:           218/94 mmHg Patient Gender: F                HR:           55 bpm. Exam Location:  [address] Procedure: 2D Echo, 3D Echo, Cardiac Doppler, Color Doppler and Strain Analysis Indications:    R01.1 Murmur  History:        Patient has prior history of Echocardiogram examinations, most                 recent 07/11/2017. Signs/Symptoms:Murmur; Risk                  Factors:Hypertension, Sleep Apnea and Dyslipidemia.  Sonographer:    Kwong Chung S, RDCS Referring Phys: [id number] JEFFREY C HATCHER IMPRESSIONS  1. Left ventricular ejection fraction, by estimation, is 60 to 65%. The left ventricle has normal function. The left ventricle has no regional wall motion abnormalities. There is moderate left ventricular hypertrophy. Left ventricular diastolic parameters are consistent with Grade I diastolic dysfunction (impaired relaxation). Elevated left atrial pressure. The average left ventricular global longitudinal strain is -20.4 %. The global longitudinal strain is normal.  2. Right ventricular systolic function is normal. The right ventricular size is normal.  3. The mitral valve is normal in structure. Trivial mitral valve regurgitation. No evidence of mitral stenosis.  4. The aortic valve is tricuspid. Aortic valve regurgitation is not visualized. Mild aortic valve stenosis.  5. Aortic dilatation noted. There is mild dilatation of the ascending aorta, measuring 43 mm.  6. The inferior vena cava is normal in size with greater than 50% respiratory variability, suggesting right atrial pressure of 3 mmHg. Comparison(s): 07/11/17 EF 60-65%. PA pressure 33mmHg. FINDINGS  Left Ventricle: Left ventricular ejection fraction, by estimation, is 60 to 65%. The left ventricle has normal function. The left ventricle has no regional wall motion abnormalities. The average left ventricular global longitudinal strain is -20.4 %. The global longitudinal strain is normal. The left ventricular internal cavity size was normal in size. There is moderate left ventricular hypertrophy. Left ventricular diastolic parameters are consistent with Grade I diastolic dysfunction (impaired relaxation). Elevated left atrial pressure. Right Ventricle: The right ventricular size is normal. Right ventricular systolic function is normal. Left Atrium: Left atrial size was normal in size. Right Atrium: Right atrial size was  normal in size. Pericardium: There is no evidence of pericardial effusion. Mitral Valve: The mitral valve is normal in structure. Trivial mitral valve regurgitation. No evidence of mitral valve stenosis. Tricuspid Valve: The tricuspid valve is normal in structure. Tricuspid valve regurgitation is mild . No evidence of tricuspid stenosis. Aortic Valve: The aortic valve is tricuspid. Aortic valve regurgitation is not visualized. Mild aortic stenosis is present. Aortic valve mean gradient measures 10.7 mmHg. Aortic valve peak gradient measures 20.0 mmHg. Aortic valve area,   by VTI measures 2.50 cm. Pulmonic Valve: The pulmonic valve was normal in structure. Pulmonic valve regurgitation is trivial. No evidence of pulmonic stenosis. Aorta: Aortic dilatation noted. There is mild dilatation of the ascending aorta, measuring 43 mm. Venous: The inferior vena cava is normal in size with greater than 50% respiratory variability, suggesting right atrial pressure of 3 mmHg. IAS/Shunts: No atrial level shunt detected by color flow Doppler.  LEFT VENTRICLE PLAX 2D LVIDd:         4.80 cm  Diastology LVIDs:         2.30 cm  LV e' medial:    4.46 cm/s LV PW:         1.20 cm  LV E/e' medial:  17.5 LV IVS:        1.20 cm  LV e' lateral:   5.11 cm/s LVOT diam:     2.10 cm  LV E/e' lateral: 15.3 LV SV:         135 LV SV Index:   68       2D Longitudinal Strain LVOT Area:     3.46 cm 2D Strain GLS (A2C):   -23.5 %                         2D Strain GLS (A3C):   -19.9 %                         2D Strain GLS (A4C):   -17.7 %                         2D Strain GLS Avg:     -20.4 %                          3D Volume EF:                         3D EF:        59 %                         LV EDV:       118 ml                         LV ESV:       48 ml                         LV SV:        70 ml RIGHT VENTRICLE RV Basal diam:  3.60 cm RV S prime:     14.70 cm/s TAPSE (M-mode): 2.7 cm LEFT ATRIUM             Index       RIGHT ATRIUM            Index LA diam:        4.30 cm 2.17 cm/m  RA Pressure: 3.00 mmHg LA Vol (A2C):   53.7 ml 27.09 ml/m RA Area:     11.50 cm LA Vol (A4C):   44.3 ml 22.35 ml/m RA Volume:   23.70 ml  11.96 ml/m LA Biplane Vol: 50.0 ml 25.22 ml/m  AORTIC VALVE AV Area (Vmax):    2.12 cm AV Area (Vmean):   1.99 cm AV   Area (VTI):     2.50 cm AV Vmax:           223.67 cm/s AV Vmean:          152.000 cm/s AV VTI:            0.541 m AV Peak Grad:      20.0 mmHg AV Mean Grad:      10.7 mmHg LVOT Vmax:         137.00 cm/s LVOT Vmean:        87.400 cm/s LVOT VTI:          0.390 m LVOT/AV VTI ratio: 0.72  AORTA Ao Root diam: 3.50 cm Ao Asc diam:  4.15 cm MITRAL VALVE               TRICUSPID VALVE                            Estimated RAP:  3.00 mmHg  MV E velocity: 78.00 cm/s  SHUNTS MV A velocity: 84.40 cm/s  Systemic VTI:  0.39 m MV E/A ratio:  0.92        Systemic Diam: 2.10 cm Brian Crenshaw MD Electronically signed by Brian Crenshaw MD Signature Date/Time: 09/15/2020/12:53:52 PM    Final      Assessment and Plan:   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  These include bleeding, infection, kidney damage, stroke, heart attack, death.  The patient understands these risks and is willing to proceed. - starting atorvastatin 40 mg PO Daily (deferred rosuvastatin given yellow dye allergy) -potentially DC 09/16/20 based on pain mgmt, UTI sx resolution, and intervention   Risk Assessment/Risk Scores:    TIMI Risk Score for Unstable Angina or Non-ST Elevation MI:   The patient's TIMI risk score is 4, which indicates a 20% risk of all cause mortality, new or recurrent myocardial infarction or need for urgent revascularization in the next 14 days.{ Click her  For questions or updates, please contact [facility] Please consult [email or web address] for contact info under     Signed, Alyssabeth Bruster A Sera Hitsman, MD  09/15/2020 8:18 PM   

## 2020-09-15 NOTE — Progress Notes (Signed)
Cardiology Office Note:    Date:  09/15/2020   ID:  Jennifer [name], DOB January 23, 1946, MRN [id number]  PCP:  [facility]   [facility] Providers Cardiologist:  None     Referring MD: [facility]   CC:  Urgent DOD visit: Chest Pain and pressure  History of Present Illness:    Jennifer [name] is a 75 y.o. female with a hx of hypertension, PVC, HLD, chronic pain syndrome, who presents as an urgent visit for HTN emergency (found in echo lab).  Patient notes that she is feeling terrible; like something isn't right. Discomfort occurred as chest burning with no radiation that feels different than her usually GERD.  Biggest trigger is with her elevated blood pressure, and improves with rest.  Patient exertion notable for no exertion related to lumbar stenosis..  No shortness of breath, DOE .  No PND or orthopnea.  No bendopnea, weight gain, leg swelling , or abdominal swelling.  No syncope or near syncope . Notes  no palpitations or funny heart beats.  Has had chest pain with elevated blood pressure both at 09/08/20 ED visit (negative troponin); at 09/14/20 EMS visit, and today.  Did not want to go to ED 09/14/20 but continued to have active chest pain at home.   Past Medical History:  Diagnosis Date   Acute meniscal tear of left knee    FOLLOWED BY DR GIAFFREY   BPPV (benign paroxysmal positional vertigo)    Chronic bladder pain    Chronic fatigue syndrome    Chronic low back pain    W/ RIGHT LEG PAIN AND NUMBNESS   Cystitis, chronic    Fibromyalgia    GERD (gastroesophageal reflux disease)    Hiatal hernia    Hypertension    IC (interstitial cystitis)    OSA (obstructive sleep apnea)    per pt study yrs ago-- moderate osa ,  intolerant cpap   Pinched vertebral nerve    bilateral L2 -- L3 and L3 -- L4-/  epidural injection's treatment , PT and pain clince   PONV (postoperative nausea and vomiting)    severe   S/P urinary bladder replacement     1984  new bladder made from colon    Self-catheterizes urinary bladder    Spinal stenosis, lumbar region with neurogenic claudication    Wears glasses    Wears partial dentures    upper and lower    Past Surgical History:  Procedure Laterality Date   ABDOMINAL HYSTERECTOMY  1980   w/ Left Salpingoophorectomy   CARDIAC CATHETERIZATION  08-21-2001  dr Verdis Prime   normal coronary arteries and LVF   CARPAL TUNNEL RELEASE Bilateral 1986   CECALCYSTOPLASTY/  APPENDECTOMY  1984   at [facility]   CHOLECYSTECTOMY  1992   CYSTO WITH HYDRODISTENSION N/A 01/12/2016   Procedure: CYSTOSCOPY/HYDRODISTENSION;  Surgeon: Jethro Bolus, MD;  Location: [facility] [place];  Service: Urology;  Laterality: N/A;   CYSTO WITH HYDRODISTENSION N/A 08/30/2016   Procedure: CYSTOSCOPY/HYDRODISTENSION OF BLADDER INJECTION OF MARCAINE/PYRIDIUM;  Surgeon: Jethro Bolus, MD;  Location: [facility];  Service: Urology;  Laterality: N/A;   MULTIPLE CYSTO/  HYDRODISTENTION/  INSTILLATION THERAPY  last one 08-19-2009   NASAL SINUS SURGERY  1987   TONSILLECTOMY  as child   VAGINAL GROWTH REMOVED  as teen    Current Medications: No outpatient medications have been marked as taking for the 09/15/20 encounter (Appointment) with Jennifer Constant, MD.  Allergies:   Avelox [moxifloxacin], Quinolones, Sulfa antibiotics, Aspirin, Dimethyl sulfoxide, Duloxetine hcl, Hydrochlorothiazide, Ibuprofen, Nitrofurantoin, Topiramate, Adhesive [tape], Codeine, Macrodantin [nitrofurantoin macrocrystal], and Yellow dye   Social History   Socioeconomic History   Marital status: Single    Spouse name: Not on file   Number of children: 0   Years of education: College   Highest education level: Not on file  Occupational History   Occupation: n/a  Tobacco Use   Smoking status: Never   Smokeless tobacco: Never  Vaping Use   Vaping Use: Never used  Substance and Sexual Activity   Alcohol  use: No    Alcohol/week: 0.0 standard drinks   Drug use: No   Sexual activity: Not on file  Other Topics Concern   Not on file  Social History Narrative   Lives alone   Caffeine use: 2 glasses tea/day   Social Determinants of Health   Financial Resource Strain: Not on file  Food Insecurity: Not on file  Transportation Needs: Not on file  Physical Activity: Not on file  Stress: Not on file  Social Connections: Not on file     Family History: The patient's family history includes Alzheimer's disease in her father; Asthma in her father; CAD in her father; COPD in her father; Cancer in her brother; Colonic polyp in her mother; Emphysema in her father; Heart failure in her mother; Hypertension in her father and mother; Peripheral vascular disease in her mother; Stroke in her mother.  ROS:   Please see the history of present illness.     All other systems reviewed and are negative.  EKGs/Labs/Other Studies Reviewed:    The following studies were reviewed today:  EKG:    EMS Strip Reviewed:  Sinus bradycardia rate 58 isolated PVC  Echo: LVEF 60%+, GLS -20; Aortic Sclerosis Peak velocity ~ 2.4 m/s  Recent Labs: 09/08/2020: ALT 17; BUN 11; Creatinine, Ser 0.78; Potassium 3.6; Sodium 138  Recent Lipid Panel No results found for: CHOL, TRIG, HDL, CHOLHDL, VLDL, LDLCALC, LDLDIRECT   Risk Assessment/Calculations:     N/A  Physical Exam:    VS:  SBP 260/120 Heart rate 57 RR 22  Wt Readings from Last 3 Encounters:  09/15/20 91.2 kg  09/08/20 91.2 kg  08/04/20 88 kg     GEN:  Well nourished, well developed in no acute distress HEENT: Bilateral Frank's Sign NECK: No JVD LYMPHATICS: No lymphadenopathy CARDIAC: Regular bradycardia rate 57 II/VI Systolic Crescendo RESPIRATORY:  Clear to auscultation without rales, wheezing or rhonchi  ABDOMEN: Soft, non-tender, non-distended MUSCULOSKELETAL:  No edema; No deformity  SKIN: Warm and dry NEUROLOGIC:  Alert and oriented x  3 PSYCHIATRIC:  Normal affect   ASSESSMENT:    1. Chest pain of uncertain etiology   2. Hypertensive emergency    PLAN:    Chest Pain Syndrome Hypertensive Emergency Chronic Pain Syndome - given her active chest discomfort and HTN recommend ED assessment Patient amenable - Called ED and discussed case with ED PA - Assisted with ordering CCTA to rule out ischemic disease, Nitroglycerin and metoprolol 25 mg 90-120 mg prior to scan could be considered  Facilitating ED transport        Medication Adjustments/Labs and Tests Ordered: Current medicines are reviewed at length with the patient today.  Concerns regarding medicines are outlined above.  No orders of the defined types were placed in this encounter.  No orders of the defined types were placed in this encounter.   There are no  Patient Instructions on file for this visit.   Signed, Jennifer Constant, MD  09/15/2020 12:57 PM    [facility]

## 2020-09-15 NOTE — ED Notes (Signed)
Pt transported to CT ?

## 2020-09-15 NOTE — H&P (Addendum)
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 [name] with cardiology for evaluation of chest discomfort and hypertensive urgency.  Patient explains that in the recent past the patient has been battling markedly elevated blood pressures that has continued to persist despite antihypertensive adjustments by her outpatient providers.  Approximate the past 2 weeks the patient is also began to develop chest discomfort.  Patient describes chest discomfort as midsternal in location, pressure-like in quality and radiating to the left shoulder.  Discomfort waxes and wanes but is essentially constant.  Patient cannot endorse any association with exertion.  Patient complains of some associated mild shortness of breath.  Patient denies paroxysmal nocturnal dyspnea pillow orthopnea or peripheral edema.  Patient denies cough.  Patient went to undergo outpatient echocardiogram this morning and during that visit patient was noted to have markedly elevated blood pressure.  Patient was then urgently seen by Dr. [name] with urology who then sent the patient to the emergency department for further evaluation.  Upon evaluation in the emergency department, patient underwent CT angiogram of the coronary arteries per cardiology recommendations.  This revealed moderate stenosis with CT FFR pending.  Cardiology recommended hospitalization with them following the patient and  potentially perform a cardiac catheterization during this hospital stay.  Hospitalist group was called to assess the patient for admission to the hospital for this as well as management of patient's uncontrolled hypertensive urgency and impending emergency.    Review of Systems:   Review of Systems  Respiratory:  Positive for shortness of breath.   Cardiovascular:  Positive for chest pain.  All other systems reviewed and are negative.  Past Medical History:  Diagnosis Date   Acute meniscal tear of left knee    FOLLOWED BY DR GIAFFREY   BPPV (benign paroxysmal positional vertigo)    Chronic bladder pain    Chronic fatigue syndrome    Chronic low back pain    W/ RIGHT LEG PAIN AND NUMBNESS   Cystitis, chronic    Fibromyalgia    GERD (gastroesophageal reflux disease)    Hiatal hernia    Hypertension    IC (interstitial cystitis)    OSA (obstructive sleep apnea)    per pt study yrs ago-- moderate osa ,  intolerant cpap   Pinched vertebral nerve    bilateral L2 -- L3 and L3 -- L4-/  epidural injection's treatment , PT and pain clince   PONV (postoperative nausea and vomiting)    severe   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    Self-catheterizes urinary bladder    Spinal stenosis, lumbar region with neurogenic claudication    Wears glasses    Wears partial dentures    upper and lower    Past Surgical History:  Procedure Laterality Date   ABDOMINAL HYSTERECTOMY  1980   w/ Left Salpingoophorectomy   CARDIAC CATHETERIZATION  08-21-2001  dr Verdis Prime   normal coronary arteries and LVF   CARPAL TUNNEL RELEASE Bilateral 1986  CECALCYSTOPLASTY/  APPENDECTOMY  1984   at [facility]   CHOLECYSTECTOMY  1992   CYSTO WITH HYDRODISTENSION N/A 01/12/2016   Procedure: CYSTOSCOPY/HYDRODISTENSION;  Surgeon: Jethro Bolus, MD;  Location: [facility];  Service: Urology;  Laterality: N/A;   CYSTO WITH HYDRODISTENSION N/A 08/30/2016    Procedure: CYSTOSCOPY/HYDRODISTENSION OF BLADDER INJECTION OF MARCAINE/PYRIDIUM;  Surgeon: Jethro Bolus, MD;  Location: [facility];  Service: Urology;  Laterality: N/A;   MULTIPLE CYSTO/  HYDRODISTENTION/  INSTILLATION THERAPY  last one 08-19-2009   NASAL SINUS SURGERY  1987   TONSILLECTOMY  as child   VAGINAL GROWTH REMOVED  as teen     reports that she has never smoked. She has never used smokeless tobacco. She reports that she does not drink alcohol and does not use drugs.  Allergies  Allergen Reactions   Moxifloxacin Hives, Shortness Of Breath, Swelling and Rash    Rash was severe, caused tremors, ans skin became BLOODY RED   Quinolones Anaphylaxis   Sulfa Antibiotics Hives and Rash   Furosemide Other (See Comments)    CANNOT TAKE DUE TO Interstitial cystitis   Aspirin Nausea Only and Other (See Comments)    GI and stomach upset   Duloxetine Hcl Nausea Only and Other (See Comments)    Vertigo and heart racing also    Hydrochlorothiazide Other (See Comments)    Headaches    Ibuprofen Nausea Only and Other (See Comments)    GI upset   Other Other (See Comments)    Has GERD- NO SPICY FOODS!!   Robaxin [Methocarbamol] Other (See Comments)    "Make me feel flu-like symptoms"   Topiramate Itching   Zofran [Ondansetron Hcl] Other (See Comments)    Headaches    Codeine Hives, Nausea And Vomiting and Rash   Dimethyl Sulfoxide Hives and Itching   Macrodantin [Nitrofurantoin Macrocrystal] Itching, Nausea And Vomiting and Rash    Abdominal and chest pain, also- blood-red skin, also   Nitrofurantoin Diarrhea, Itching, Rash and Other (See Comments)    GI/stomach upset, sweating, chest pain, and skin turned bloody red   Tape Rash   Yellow Dye Rash    Family History  Problem Relation Age of Onset   CAD Father    Asthma Father    Hypertension Father    Alzheimer's disease Father    Emphysema Father    COPD Father    Heart failure Mother    Peripheral  vascular disease Mother    Stroke Mother    Hypertension Mother    Colonic polyp Mother    Cancer Brother        melanoma     Prior to Admission medications   Medication Sig Start Date End Date Taking? Authorizing Provider  acetaminophen (TYLENOL) 500 MG tablet Take 500 mg by mouth 2 (two) times daily as needed (for pain).   Yes [provider]  amoxicillin-clavulanate (AUGMENTIN) 500-125 MG tablet Take 1 tablet by mouth 3 (three) times daily. 09/05/20  Yes [provider]  D-Mannose 500 MG CAPS Take 500-1,500 mg by mouth 3 (three) times daily.   Yes [provider]  dextromethorphan-guaiFENesin (MUCINEX DM) 30-600 MG 12hr tablet Take 1 tablet by mouth 2 (two) times daily as needed for cough.   Yes [provider]  fexofenadine (ALLEGRA) 60 MG tablet Take 60 mg by mouth See admin instructions. Take 60 mg by mouth one to two times a day as needed for allergy symptoms 01/06/14  Yes [provider]  fluconazole (DIFLUCAN) 100 MG tablet Take 1 tablet (100 mg total) by mouth daily. Patient taking differently: Take 100 mg by mouth daily as needed (AS DIRECTED- when taking antibiotics). 08/04/20  Yes Ginnie Smart, MD  HYDROcodone-acetaminophen (NORCO) 7.5-325 MG tablet Take 1 tablet by mouth See admin instructions. Take 1 tablet by mouth three to four times a day 02/07/15  Yes [provider]  ipratropium (ATROVENT) 0.06 % nasal spray Place 2 sprays into both nostrils 2 (two) times daily as needed for rhinitis.   Yes [provider]  losartan (COZAAR) 100 MG tablet Take 100 mg by mouth daily.   Yes [provider]  meclizine (ANTIVERT) 25 MG tablet Take 25 mg by mouth 3 (three) times daily as needed for dizziness.   Yes [provider]  Meth-Hyo-M Bl-Benz Acd-Ph Sal 81.6 MG TABS Take 1 tablet (81.6 mg total) by mouth 2 (two) times daily as needed. Patient taking differently: Take 1 tablet by mouth 2 (two) times daily as  needed (for strong spasms and burning). 06/07/17  Yes Ginnie Smart, MD  nebivolol (BYSTOLIC) 5 MG tablet Take 10 mg by mouth daily.   Yes [provider]  nystatin cream (MYCOSTATIN) Apply topically 2 (two) times daily. 08/04/20  Yes Ginnie Smart, MD  omeprazole (PRILOSEC) 20 MG capsule Take 20 mg by mouth See admin instructions. Take 20 mg by mouth in the morning one hour before breakfast and an additional 20 mg before evening meal as needed for GERD 02/08/14  Yes [provider]  ondansetron (ZOFRAN-ODT) 4 MG disintegrating tablet Take 4 mg by mouth every 8 (eight) hours as needed for nausea (dissolve orally). 07/01/17  Yes [provider]  sucralfate (CARAFATE) 1 G tablet Take 1 g by mouth See admin instructions. Take 1 gram by mouth in the morning and an additional 1 gram once a day for flares   Yes [provider]  triamcinolone cream (KENALOG) 0.1 % Apply 1 application topically daily as needed (for eczema). 02/03/15  Yes [provider]  amoxicillin-clavulanate (AUGMENTIN) 875-125 MG tablet Take 1 tablet by mouth 2 (two) times daily. 07/26/20   [provider]  augmented betamethasone dipropionate (DIPROLENE-AF) 0.05 % cream 1 application 08/12/19   [provider]  COVID-19 mRNA vaccine, Moderna, 100 MCG/0.5ML injection INJECT AS DIRECTED Patient not taking: Reported on 09/15/2020 02/01/20 01/31/21  Judyann Munson, MD  DENTA 5000 PLUS 1.1 % CREA dental cream  09/16/17   [provider]  diazepam (VALIUM) 5 MG tablet Take 5 mg by mouth See admin instructions. Take 5 mg by mouth prior to procedures 07/03/19   [provider]  Fluoxetine HCl, PMDD, 10 MG TABS Take by mouth.    [provider]  fluticasone (FLONASE) 50 MCG/ACT nasal spray USE 2 SPRAYS INTO EACH NOSTRIL AT NIGHT 09/16/17   [provider]  fosfomycin (MONUROL) 3 g PACK Take by mouth. 08/10/19   [provider]  hydrocortisone  (ANUSOL-HC) 25 MG suppository UNWRAP AND INSERT 1 SUPPOSITORY RECTALLY TWICE A DAY AS NEEDED Patient not taking: No sig reported 05/26/17   [provider]  losartan (COZAAR) 50 MG tablet Take 100 mg by mouth daily.  08/12/17   [provider]  Methen-Hyosc-Meth Blue-Na Phos (UROGESIC-BLUE) 81.6 MG TABS TAKE 1 TABLET BY MOUTH EVERY 6 HOURS AS NEEDED BURNING 04/17/19   [provider]  methenamine (HIPREX) 1 G tablet Take by mouth as directed. TAKES WHEN NOT  TAKING ANTIBIOTICS    [provider]  methocarbamol (ROBAXIN) 500 MG tablet Take 1 tablet by mouth 3 (three) times daily as needed for muscle spasms. Patient not taking: Reported on 09/15/2020 03/10/20   [provider]  nebivolol (BYSTOLIC) 10 MG tablet Take 5 mg by mouth every morning.  01/08/14   [provider]  pentosan polysulfate (ELMIRON) 100 MG capsule 100 mg. WEEKLY PRN BLADDER INSTILLATION AT DR Patsi Sears OFFICE Patient not taking: No sig reported    [provider]  Probiotic Product (PROBIOTIC DAILY) CAPS 1 capsule Patient not taking: Reported on 09/15/2020 10/15/17   [provider]  promethazine (PHENERGAN) 25 MG tablet 1 tablet every six (6) hours as needed. 02/08/14   [provider]    Physical Exam: Vitals:   09/15/20 1738 09/15/20 1739 09/15/20 1815 09/15/20 1855  BP:   (!) 160/69 (!) 168/72  Pulse: (!) 53 (!) 56 (!) 59 (!) 50  Resp: Temp:      TempSrc:      SpO2: 97% 96% 94% 97%  Weight:      Height:        Constitutional: Awake alert and oriented x3, no associated distress.   Skin: no rashes, no lesions, good skin turgor noted. Eyes: Pupils are equally reactive to light.  No evidence of scleral icterus or conjunctival pallor.  ENMT: Moist mucous membranes noted.  Posterior pharynx clear of any exudate or lesions.   Neck: normal, supple, no masses, no thyromegaly.  No evidence of jugular venous distension.   Respiratory:  clear to auscultation bilaterally, no wheezing, no crackles. Normal respiratory effort. No accessory muscle use.  Cardiovascular: Regular rate and rhythm, no murmurs / rubs / gallops. No extremity edema. 2+ pedal pulses. No carotid bruits.  Chest:   Notable reproducible chest tenderness without evidence of crepitus or deformity.  Back:   Nontender without crepitus or deformity. Abdomen: Abdomen is soft and nontender.  No evidence of intra-abdominal masses.  Positive bowel sounds noted in all quadrants.   Musculoskeletal: No joint deformity upper and lower extremities. Good ROM, no contractures. Normal muscle tone.  Neurologic: CN 2-12 grossly intact. Sensation intact.  Patient moving all 4 extremities spontaneously.  Patient is following all commands.  Patient is responsive to verbal stimuli.   Psychiatric: Patient exhibits normal mood with appropriate affect.  Patient seems to possess insight as to their current situation.     Labs on Admission: I have personally reviewed following labs and imaging studies -   CBC: Recent Labs  Lab 09/15/20 1218  WBC 7.4  HGB 11.6*  HCT 35.7*  MCV 88.1  PLT 258   Basic Metabolic Panel: Recent Labs  Lab 09/15/20 1218  NA 140  K 3.6  CL 106  CO2 24  GLUCOSE 101*  BUN 8  CREATININE 0.71  CALCIUM 8.6*   GFR: Estimated Creatinine Clearance: 70.9 mL/min (by C-G formula based on SCr of 0.71 mg/dL). Liver Function Tests: No results for input(s): AST, ALT, ALKPHOS, BILITOT, PROT, ALBUMIN in the last 168 hours. No results for input(s): LIPASE, AMYLASE in the last 168 hours. No results for input(s): AMMONIA in the last 168 hours. Coagulation Profile: No results for input(s): INR, PROTIME in the last 168 hours. Cardiac Enzymes: No results for input(s): CKTOTAL, CKMB, CKMBINDEX, TROPONINI in the last 168 hours. BNP (last 3 results) No results for input(s): PROBNP in the last 8760 hours. HbA1C: No results for input(s): HGBA1C in the  last 72  hours. CBG: No results for input(s): GLUCAP in the last 168 hours. Lipid Profile: No results for input(s): CHOL, HDL, LDLCALC, TRIG, CHOLHDL, LDLDIRECT in the last 72 hours. Thyroid Function Tests: No results for input(s): TSH, T4TOTAL, FREET4, T3FREE, THYROIDAB in the last 72 hours. Anemia Panel: No results for input(s): VITAMINB12, FOLATE, FERRITIN, TIBC, IRON, RETICCTPCT in the last 72 hours. Urine analysis:    Component Value Date/Time   COLORURINE YELLOW 11/26/2019 1540   APPEARANCEUR CLOUDY (A) 11/26/2019 1540   LABSPEC 1.020 11/26/2019 1540   PHURINE 5.5 11/26/2019 1540   GLUCOSEU NEGATIVE 11/26/2019 1540   HGBUR TRACE (A) 11/26/2019 1540   BILIRUBINUR SMALL (A) 02/15/2017 1422   KETONESUR NEGATIVE 11/26/2019 1540   PROTEINUR TRACE (A) 11/26/2019 1540   NITRITE NEGATIVE 11/26/2019 1540   LEUKOCYTESUR 2+ (A) 11/26/2019 1540    Radiological Exams on Admission - Personally Reviewed: CT Head Wo Contrast  Result Date: 09/15/2020 CLINICAL DATA:  Headache EXAM: CT HEAD WITHOUT CONTRAST TECHNIQUE: Contiguous axial images were obtained from the base of the skull through the vertex without intravenous contrast. COMPARISON:  None. FINDINGS: Brain: Normal anatomic configuration. Parenchymal volume loss is commensurate with the patient's age. Mild periventricular white matter changes are present likely reflecting the sequela of small vessel ischemia. No abnormal intra or extra-axial mass lesion or fluid collection. No abnormal mass effect or midline shift. No evidence of acute intracranial hemorrhage or infarct. Ventricular size is normal. Cerebellum unremarkable. Vascular: No asymmetric hyperdense vasculature at the skull base. Skull: Intact Sinuses/Orbits: Mucous retention cyst noted within the right maxillary sinus. Remaining paranasal sinuses are clear. Orbits are unremarkable. Other: Mastoid air cells and middle ear cavities are clear. IMPRESSION: No acute intracranial abnormality.  Mild  senescent change. Electronically Signed   By: Helyn Numbers MD   On: 09/15/2020 15:53   CT CORONARY MORPH W/CTA COR W/SCORE W/CA W/CM &/OR WO/CM  Addendum Date: 09/15/2020   ADDENDUM REPORT: 09/15/2020 18:26 CLINICAL DATA:  75 Year-old Caucasian Female EXAM: Cardiac/Coronary  CTA TECHNIQUE: The patient was scanned on a [employer]. FINDINGS: A 100 kV prospective scan was triggered in the descending thoracic aorta at 111 HU's. Axial non-contrast 3 mm slices were carried out through the heart. The data set was analyzed on a dedicated work station and scored using the Agatson method. Gantry rotation speed was 250 msecs and collimation was .6 mm. No beta blockade and 0.8 mg of sl NTG was given. The 3D data set was reconstructed in 5% intervals of the 67-82 % of the R-R cycle. Diastolic phases were analyzed on a dedicated work station using MPR, MIP and VRT modes. The patient received 100 cc of contrast. Aorta: Ascending aorta is mildly dilated for age and body surface area: 40 mm. No calcifications in cardiac field of view. No dissection. Main Pulmonary Artery: Mild dilation of the pulmonary artery: 32 mm with artery to aorta ratio of < 0.9. Aortic Valve:  Tri-leaflet.  Mild annular calcification. Coronary Arteries:  Normal coronary origin.  Right dominance. Coronary Calcium Score: Left main: 0 Left anterior descending artery: 156 Left circumflex artery: 0 Right coronary artery: 1 Total: 157 Percentile: 74th for age, sex, and race matched control. RCA is a large dominant artery that gives rise to PDA and PLA. There is no plaque. Left main is a large artery that gives rise to LAD and LCX arteries. There is no plaque. LAD is a large vessel that gives rise to one small D1 Branch.  There is a mild non obstructive stenosis (25-49%) calcific plaque in the proximal LAD. There is a moderate stenosis (50-70%) calcific plaque in the mid LAD. There is a moderate stenosis (50-70%) calcific plaque in the distal LAD.  LCX is a non-dominant artery.  There is no plaque. There is a ramus intermedius vessel with minimal non-obstructive (1-24%) soft plaque in the mid vessel. Other findings: Normal pulmonary vein drainage into the left atrium. Normal left atrial appendage without a thrombus. Cannot exclude small patent foramen ovale. Extra-cardiac findings: See attached radiology report for non-cardiac structures. IMPRESSION: 1. Coronary calcium score of 157. This was 74th percentile for age, sex, and race matched control. 2. Normal coronary origin with right dominance. 3. CAD-RADS 3. Moderate stenosis. Consider symptom-guided anti-ischemic pharmacotherapy as well as risk factor modification per guideline directed care. Additional analysis with CT FFR will be submitted. 4. Ascending aorta is mildly dilation for age and body surface area: 40 mm. 5. Mild dilation of the pulmonary artery. 6. Cannot exclude small patent foramen ovale. Reaching out to primary team. RECOMMENDATIONS: Coronary artery calcium (CAC) score is a strong predictor of incident coronary heart disease (CHD) and provides predictive information beyond traditional risk factors. CAC scoring is reasonable to use in the decision to withhold, postpone, or initiate statin therapy in intermediate-risk or selected borderline-risk asymptomatic adults (age 70-75 years and LDL-C >=70 to <190 mg/dL) who do not have diabetes or established atherosclerotic cardiovascular disease (ASCVD).* In intermediate-risk (10-year ASCVD risk >=7.5% to <20%) adults or selected borderline-risk (10-year ASCVD risk >=5% to <7.5%) adults in whom a CAC score is measured for the purpose of making a treatment decision the following recommendations have been made: If CAC = 0, it is reasonable to withhold statin therapy and reassess in 5 to 10 years, as long as higher risk conditions are absent (diabetes mellitus, family history of premature CHD in first degree relatives (males <55 years; females <65  years), cigarette smoking, LDL >=190 mg/dL or other independent risk factors). If CAC is 1 to 99, it is reasonable to initiate statin therapy for patients ?73 years of age. If CAC is >=100 or >=75th percentile, it is reasonable to initiate statin therapy at any age. Cardiology referral should be considered for patients with CAC scores =400 or >=75th percentile. *2018 AHA/ACC/AACVPR/AAPA/ABC/ACPM/ADA/AGS/APhA/ASPC/NLA/PCNA Guideline on the Management of Blood Cholesterol: A Report of the American College of Cardiology/American Heart Association Task Force on Clinical Practice Guidelines. J Am Coll Cardiol. 2019;73(24):3168-3209. Riley Lam, MD Electronically Signed   By: Riley Lam MD   On: 09/15/2020 18:26   Result Date: 09/15/2020 EXAM: OVER-READ INTERPRETATION  CT CHEST The following report is an over-read performed by radiologist Dr. Jeronimo Greaves of [facility] on 09/15/2020. This over-read does not include interpretation of cardiac or coronary anatomy or pathology. The coronary CTA interpretation by the cardiologist is attached. COMPARISON:  Chest radiograph of earlier today. FINDINGS: Vascular: Upper normal ascending aortic caliber, including 3.9 cm on 09/10. Aortic atherosclerosis. No central pulmonary embolism, on this non-dedicated study. Mediastinum/Nodes: No imaged thoracic adenopathy. Tiny hiatal hernia. Lungs/Pleura: No pleural fluid.  Clear imaged lungs. Upper Abdomen: Moderate hepatic steatosis. Normal imaged portions of the spleen. Musculoskeletal: Mild-to-moderate convex right lower thoracic spinal curvature. IMPRESSION: 1. No acute findings in the imaged extracardiac chest. 2. Hepatic steatosis. 3. Tiny hiatal hernia. 4. Aortic Atherosclerosis (ICD10-I70.0). Electronically Signed: By: Jeronimo Greaves M.D. On: 09/15/2020 15:51   DG Chest Port 1 View  Result Date: 09/15/2020 CLINICAL DATA:  Chest pain EXAM: PORTABLE CHEST 1 VIEW COMPARISON:  09/08/2020 FINDINGS:  Cardiac enlargement without heart failure. Lungs clear without infiltrate or effusion Thoracic scoliosis.  Azygos lobe fissure noted. IMPRESSION: No active disease. Electronically Signed   By: Marlan Palauharles  Clark M.D.   On: 09/15/2020 12:38   ECHOCARDIOGRAM COMPLETE  Result Date: 09/15/2020    ECHOCARDIOGRAM REPORT   Patient Name:   Domenica ReamerBARBARA E Seebeck Date of Exam: 09/15/2020 Medical Rec #:  [id number]        Height:       65.0 in Accession #:    [id number]       Weight:       201.0 lb Date of Birth:  29-Mar-1946        BSA:          1.982 m Patient Age:    74 years         BP:           218/94 mmHg Patient Gender: F                HR:           55 bpm. Exam Location:  [address] Procedure: 2D Echo, 3D Echo, Cardiac Doppler, Color Doppler and Strain Analysis Indications:    R01.1 Murmur  History:        Patient has prior history of Echocardiogram examinations, most                 recent 07/11/2017. Signs/Symptoms:Murmur; Risk                 Factors:Hypertension, Sleep Apnea and Dyslipidemia.  Sonographer:    Garald BraverKwong Chung S, RDCS Referring Phys: [id number] JEFFREY C HATCHER IMPRESSIONS  1. Left ventricular ejection fraction, by estimation, is 60 to 65%. The left ventricle has normal function. The left ventricle has no regional wall motion abnormalities. There is moderate left ventricular hypertrophy. Left ventricular diastolic parameters are consistent with Grade I diastolic dysfunction (impaired relaxation). Elevated left atrial pressure. The average left ventricular global longitudinal strain is -20.4 %. The global longitudinal strain is normal.  2. Right ventricular systolic function is normal. The right ventricular size is normal.  3. The mitral valve is normal in structure. Trivial mitral valve regurgitation. No evidence of mitral stenosis.  4. The aortic valve is tricuspid. Aortic valve regurgitation is not visualized. Mild aortic valve stenosis.  5. Aortic dilatation noted. There is mild dilatation of the ascending  aorta, measuring 43 mm.  6. The inferior vena cava is normal in size with greater than 50% respiratory variability, suggesting right atrial pressure of 3 mmHg. Comparison(s): 07/11/17 EF 60-65%. PA pressure 33mmHg. FINDINGS  Left Ventricle: Left ventricular ejection fraction, by estimation, is 60 to 65%. The left ventricle has normal function. The left ventricle has no regional wall motion abnormalities. The average left ventricular global longitudinal strain is -20.4 %. The global longitudinal strain is normal. The left ventricular internal cavity size was normal in size. There is moderate left ventricular hypertrophy. Left ventricular diastolic parameters are consistent with Grade I diastolic dysfunction (impaired relaxation). Elevated left atrial pressure. Right Ventricle: The right ventricular size is normal. Right ventricular systolic function is normal. Left Atrium: Left atrial size was normal in size. Right Atrium: Right atrial size was normal in size. Pericardium: There is no evidence of pericardial effusion. Mitral Valve: The mitral valve is normal in structure. Trivial mitral valve regurgitation. No evidence of mitral valve stenosis. Tricuspid Valve: The tricuspid valve  is normal in structure. Tricuspid valve regurgitation is mild . No evidence of tricuspid stenosis. Aortic Valve: The aortic valve is tricuspid. Aortic valve regurgitation is not visualized. Mild aortic stenosis is present. Aortic valve mean gradient measures 10.7 mmHg. Aortic valve peak gradient measures 20.0 mmHg. Aortic valve area, by VTI measures 2.50 cm. Pulmonic Valve: The pulmonic valve was normal in structure. Pulmonic valve regurgitation is trivial. No evidence of pulmonic stenosis. Aorta: Aortic dilatation noted. There is mild dilatation of the ascending aorta, measuring 43 mm. Venous: The inferior vena cava is normal in size with greater than 50% respiratory variability, suggesting right atrial pressure of 3 mmHg. IAS/Shunts: No  atrial level shunt detected by color flow Doppler.  LEFT VENTRICLE PLAX 2D LVIDd:         4.80 cm  Diastology LVIDs:         2.30 cm  LV e' medial:    4.46 cm/s LV PW:         1.20 cm  LV E/e' medial:  17.5 LV IVS:        1.20 cm  LV e' lateral:   5.11 cm/s LVOT diam:     2.10 cm  LV E/e' lateral: 15.3 LV SV:         135 LV SV Index:   68       2D Longitudinal Strain LVOT Area:     3.46 cm 2D Strain GLS (A2C):   -23.5 %                         2D Strain GLS (A3C):   -19.9 %                         2D Strain GLS (A4C):   -17.7 %                         2D Strain GLS Avg:     -20.4 %                          3D Volume EF:                         3D EF:        59 %                         LV EDV:       118 ml                         LV ESV:       48 ml                         LV SV:        70 ml RIGHT VENTRICLE RV Basal diam:  3.60 cm RV S prime:     14.70 cm/s TAPSE (M-mode): 2.7 cm LEFT ATRIUM             Index       RIGHT ATRIUM           Index LA diam:        4.30 cm 2.17 cm/m  RA Pressure: 3.00 mmHg LA Vol (A2C):   53.7 ml 27.09 ml/m RA Area:  11.50 cm LA Vol (A4C):   44.3 ml 22.35 ml/m RA Volume:   23.70 ml  11.96 ml/m LA Biplane Vol: 50.0 ml 25.22 ml/m  AORTIC VALVE AV Area (Vmax):    2.12 cm AV Area (Vmean):   1.99 cm AV Area (VTI):     2.50 cm AV Vmax:           223.67 cm/s AV Vmean:          152.000 cm/s AV VTI:            0.541 m AV Peak Grad:      20.0 mmHg AV Mean Grad:      10.7 mmHg LVOT Vmax:         137.00 cm/s LVOT Vmean:        87.400 cm/s LVOT VTI:          0.390 m LVOT/AV VTI ratio: 0.72  AORTA Ao Root diam: 3.50 cm Ao Asc diam:  4.15 cm MITRAL VALVE               TRICUSPID VALVE                            Estimated RAP:  3.00 mmHg  MV E velocity: 78.00 cm/s  SHUNTS MV A velocity: 84.40 cm/s  Systemic VTI:  0.39 m MV E/A ratio:  0.92        Systemic Diam: 2.10 cm Olga Millers MD Electronically signed by Olga Millers MD Signature Date/Time: 09/15/2020/12:53:52 PM    Final     EKG:  Personally reviewed.  Rhythm is sinus bradycardia with heart rate of 55 bpm.  No dynamic ST segment changes appreciated.  Assessment/Plan Principal Problem:   Hypertensive urgency  Patient presenting with severe hypertension in the emergency department with initial blood pressures of 217/77 despite patient taking her home regimen of Bystolic and losartan at home this morning. Patient states that she has never undergone a work-up for secondary causes of hypertension No evidence on chemistry of underlying primary hyperaldosteronism.  Clinically, patient does not have a presentation consistent with pheochromocytoma.  No obvious evidence of medication side effects that may be causing uncontrolled blood pressure either. Uncontrolled pain could be contributing in some capacity. TSH pending.  Renal artery Doppler ordered. Target blood pressure reduction in the first 24 hours of 25% Patient is limited to only 5 mg of Bystolic every 24 hours due to bradycardia. Patient is allergic to hydrochlorothiazide  Therefore will initiate hydralazine 25 mg p.o. every 8 hours in addition to continuing home regimen of losartan 100 mg daily As needed intravenous antihypertensives or markedly elevated blood pressure. Cycling cardiac enzymes Monitoring patient on telemetry CT head unremarkable Chest x-ray reveals no evidence of acute pulmonary edema Renal function at baseline on chemistry  Active Problems:   Chest pain  Patient presenting with chest discomfort over the past 2 weeks with both typical and atypical features At the direction of cardiology patient is undergone CT angiogram of the coronaries revealing moderate stenosis with pending CT [facility] to continue to follow while hospitalized, patient may go for cardiac catheterization tomorrow at their discretion Will make patient n.p.o. after midnight Monitoring patient on telemetry As needed sublingual nitroglycerin for further bouts of chest  discomfort Initial cardiac enzymes unremarkable    GERD (gastroesophageal reflux disease)  Continuing home regimen of daily PPI therapy.    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 Logan Bores with urology Patient has recently been placed on a course of Augmentin 500 /125 mg 3 times daily which we will continue while patient is currently here Patient intermittently catheterizes herself approximately every 4 hours which will be ordered here. Outpatient follow-up    Chronic low back pain  Continue home regimen of as needed hydrocodone Uncontrolled pain could be contributing to patient's uncontrolled blood pressure   chronic diastolic CHF (congestive heart failure) (HCC)  No clinical evidence of cardiogenic volume overload     Code Status:  Full code Family Communication: deferred   Status is: Observation  The patient remains OBS appropriate and will d/c before 2 midnights.  Dispo: The patient is from: Home              Anticipated d/c is to: Home              Patient currently is not medically stable to d/c.   Difficult to place patient No        Marinda Elk MD [facility] Pager [phone]  If 7PM-7AM, please contact night-coverage [email or web address] Use universal [place] password for that web site. If you do not have the password, please call the hospital operator.  09/15/2020, 10:06 PM

## 2020-09-15 NOTE — ED Triage Notes (Signed)
Pt arrived via EMS from MD office where she had ECHO done that was negative.  She has been having continuous chest pain, burning, and pressure for 2 weeks.

## 2020-09-15 NOTE — Plan of Care (Signed)
CT-FFR Reading Pending Fluency Fix:  EXAM:  CT-FFR ANALYSIS   CLINICAL DATA:  Possibly obstructive coronary lesion in LAD  FINDINGS:  CT-FFR analysis was performed on the original cardiac CT angiogram dataset. Diagrammatic representation of the CT-FFR analysis is provided in a separate PDF document in PACS. This dictation was created using the PDF document and an interactive 3D model of the results. 3D model is not available in the EMR/PACS. Normal FFR range is >0.80.  Left Main: No significant functional stenosis, CT-FFR 0.99. LAD: Significant functional stenosis in mid-distal LAD, CT-FFR 0.67. LCX: No significant functional stenosis, CT-FFR 0.92. RCA: No significant functional stenosis, CT-FFR 0.96. Ramus: No significant functional stenosis, CT-FFR 0.89. IMPRESSION:  1. CT FFR analysis shows  significant functional stenosis in LAD.   Riley Lam MD

## 2020-09-16 ENCOUNTER — Observation Stay (HOSPITAL_COMMUNITY)
Admit: 2020-09-16 | Discharge: 2020-09-16 | Disposition: A | Discharge status: Home / Self Care | Payer: Medicare Other | Attending: Internal Medicine | Admitting: Internal Medicine

## 2020-09-16 ENCOUNTER — Encounter (HOSPITAL_COMMUNITY): Payer: Self-pay | Admitting: Cardiovascular Disease

## 2020-09-16 ENCOUNTER — Encounter (HOSPITAL_COMMUNITY)
Admission: EM | Disposition: A | Discharge status: Home / Self Care | Payer: Self-pay | Source: Home / Self Care | Attending: Family Medicine

## 2020-09-16 ENCOUNTER — Other Ambulatory Visit (HOSPITAL_COMMUNITY): Payer: Self-pay | Admitting: *Deleted

## 2020-09-16 ENCOUNTER — Observation Stay (HOSPITAL_COMMUNITY): Payer: Medicare Other

## 2020-09-16 ENCOUNTER — Encounter (HOSPITAL_COMMUNITY): Payer: Medicare Other

## 2020-09-16 DIAGNOSIS — I160 Hypertensive urgency: Secondary | ICD-10-CM | POA: Diagnosis not present

## 2020-09-16 DIAGNOSIS — I2510 Atherosclerotic heart disease of native coronary artery without angina pectoris: Secondary | ICD-10-CM | POA: Diagnosis not present

## 2020-09-16 DIAGNOSIS — M48061 Spinal stenosis, lumbar region without neurogenic claudication: Secondary | ICD-10-CM | POA: Diagnosis present

## 2020-09-16 DIAGNOSIS — R931 Abnormal findings on diagnostic imaging of heart and coronary circulation: Secondary | ICD-10-CM

## 2020-09-16 DIAGNOSIS — I350 Nonrheumatic aortic (valve) stenosis: Secondary | ICD-10-CM | POA: Diagnosis present

## 2020-09-16 DIAGNOSIS — I161 Hypertensive emergency: Secondary | ICD-10-CM | POA: Diagnosis not present

## 2020-09-16 DIAGNOSIS — I5032 Chronic diastolic (congestive) heart failure: Secondary | ICD-10-CM

## 2020-09-16 DIAGNOSIS — I169 Hypertensive crisis, unspecified: Secondary | ICD-10-CM | POA: Diagnosis not present

## 2020-09-16 DIAGNOSIS — R079 Chest pain, unspecified: Secondary | ICD-10-CM | POA: Diagnosis not present

## 2020-09-16 DIAGNOSIS — B372 Candidiasis of skin and nail: Secondary | ICD-10-CM | POA: Diagnosis not present

## 2020-09-16 DIAGNOSIS — Z888 Allergy status to other drugs, medicaments and biological substances status: Secondary | ICD-10-CM | POA: Diagnosis not present

## 2020-09-16 DIAGNOSIS — Z6833 Body mass index (BMI) 33.0-33.9, adult: Secondary | ICD-10-CM | POA: Diagnosis not present

## 2020-09-16 DIAGNOSIS — Z820 Family history of epilepsy and other diseases of the nervous system: Secondary | ICD-10-CM | POA: Diagnosis not present

## 2020-09-16 DIAGNOSIS — I712 Thoracic aortic aneurysm, without rupture: Secondary | ICD-10-CM | POA: Diagnosis not present

## 2020-09-16 DIAGNOSIS — I25110 Atherosclerotic heart disease of native coronary artery with unstable angina pectoris: Secondary | ICD-10-CM | POA: Diagnosis not present

## 2020-09-16 DIAGNOSIS — E785 Hyperlipidemia, unspecified: Secondary | ICD-10-CM | POA: Diagnosis not present

## 2020-09-16 DIAGNOSIS — I200 Unstable angina: Secondary | ICD-10-CM | POA: Diagnosis not present

## 2020-09-16 DIAGNOSIS — G894 Chronic pain syndrome: Secondary | ICD-10-CM | POA: Diagnosis present

## 2020-09-16 DIAGNOSIS — Z808 Family history of malignant neoplasm of other organs or systems: Secondary | ICD-10-CM | POA: Diagnosis not present

## 2020-09-16 DIAGNOSIS — E876 Hypokalemia: Secondary | ICD-10-CM | POA: Diagnosis present

## 2020-09-16 DIAGNOSIS — G8929 Other chronic pain: Secondary | ICD-10-CM | POA: Diagnosis not present

## 2020-09-16 DIAGNOSIS — N3010 Interstitial cystitis (chronic) without hematuria: Secondary | ICD-10-CM | POA: Diagnosis not present

## 2020-09-16 DIAGNOSIS — E669 Obesity, unspecified: Secondary | ICD-10-CM | POA: Diagnosis not present

## 2020-09-16 DIAGNOSIS — M545 Low back pain, unspecified: Secondary | ICD-10-CM | POA: Diagnosis not present

## 2020-09-16 DIAGNOSIS — Z882 Allergy status to sulfonamides status: Secondary | ICD-10-CM | POA: Diagnosis not present

## 2020-09-16 DIAGNOSIS — I110 Hypertensive heart disease with heart failure: Secondary | ICD-10-CM | POA: Diagnosis not present

## 2020-09-16 DIAGNOSIS — I25119 Atherosclerotic heart disease of native coronary artery with unspecified angina pectoris: Secondary | ICD-10-CM | POA: Diagnosis not present

## 2020-09-16 DIAGNOSIS — D649 Anemia, unspecified: Secondary | ICD-10-CM | POA: Diagnosis not present

## 2020-09-16 DIAGNOSIS — I455 Other specified heart block: Secondary | ICD-10-CM | POA: Diagnosis not present

## 2020-09-16 DIAGNOSIS — Z885 Allergy status to narcotic agent status: Secondary | ICD-10-CM | POA: Diagnosis not present

## 2020-09-16 DIAGNOSIS — Z20822 Contact with and (suspected) exposure to covid-19: Secondary | ICD-10-CM | POA: Diagnosis not present

## 2020-09-16 DIAGNOSIS — Z886 Allergy status to analgesic agent status: Secondary | ICD-10-CM | POA: Diagnosis not present

## 2020-09-16 DIAGNOSIS — R001 Bradycardia, unspecified: Secondary | ICD-10-CM | POA: Diagnosis not present

## 2020-09-16 DIAGNOSIS — K219 Gastro-esophageal reflux disease without esophagitis: Secondary | ICD-10-CM | POA: Diagnosis not present

## 2020-09-16 HISTORY — PX: ABDOMINAL AORTOGRAM: CATH118222

## 2020-09-16 HISTORY — PX: LEFT HEART CATH AND CORONARY ANGIOGRAPHY: CATH118249

## 2020-09-16 LAB — TROPONIN I (HIGH SENSITIVITY): Troponin I (High Sensitivity): 12 ng/L (ref <18)

## 2020-09-16 LAB — TSH: TSH: 3.053 u[IU]/mL (ref 0.350–4.500)

## 2020-09-16 LAB — SARS CORONAVIRUS 2 (TAT 6-24 HRS): SARS Coronavirus 2: NEGATIVE

## 2020-09-16 SURGERY — LEFT HEART CATH AND CORONARY ANGIOGRAPHY
Anesthesia: LOCAL

## 2020-09-16 MED ORDER — IOHEXOL 350 MG/ML SOLN
INTRAVENOUS | Status: DC | PRN
  Administered 2020-09-16: 40 mL

## 2020-09-16 MED ORDER — SODIUM CHLORIDE 0.9 % IV SOLN
6.2500 mg | Freq: Four times a day (QID) | INTRAVENOUS | Status: DC | PRN
  Administered 2020-09-16 – 2020-09-17 (×3): 6.25 mg via INTRAVENOUS
  Filled 2020-09-16 (×4): qty 0.25

## 2020-09-16 MED ORDER — MIDAZOLAM HCL 2 MG/2ML IJ SOLN
INTRAMUSCULAR | Status: AC
  Filled 2020-09-16: qty 2

## 2020-09-16 MED ORDER — SODIUM CHLORIDE 0.9% FLUSH: 3.0000 mL | INTRAVENOUS | Status: DC | PRN

## 2020-09-16 MED ORDER — SODIUM CHLORIDE 0.9 % IV SOLN: 250.0000 mL | INTRAVENOUS | Status: DC | PRN

## 2020-09-16 MED ORDER — HEPARIN (PORCINE) IN NACL 1000-0.9 UT/500ML-% IV SOLN
INTRAVENOUS | Status: AC
  Filled 2020-09-16: qty 1000

## 2020-09-16 MED ORDER — FENTANYL CITRATE (PF) 100 MCG/2ML IJ SOLN
INTRAMUSCULAR | Status: DC | PRN
  Administered 2020-09-16: 50 ug via INTRAVENOUS

## 2020-09-16 MED ORDER — HEPARIN SODIUM (PORCINE) 1000 UNIT/ML IJ SOLN
INTRAMUSCULAR | Status: AC
  Filled 2020-09-16: qty 1

## 2020-09-16 MED ORDER — ASPIRIN 81 MG PO CHEW
81.0000 mg | CHEWABLE_TABLET | ORAL | Status: AC
  Administered 2020-09-16: 81 mg via ORAL
  Filled 2020-09-16: qty 1

## 2020-09-16 MED ORDER — HYDRALAZINE HCL 50 MG PO TABS
50.0000 mg | ORAL_TABLET | Freq: Three times a day (TID) | ORAL | Status: DC
  Administered 2020-09-16 – 2020-09-18 (×6): 50 mg via ORAL
  Filled 2020-09-16 (×6): qty 1

## 2020-09-16 MED ORDER — MIDAZOLAM HCL 2 MG/2ML IJ SOLN
INTRAMUSCULAR | Status: DC | PRN
  Administered 2020-09-16: 1 mg via INTRAVENOUS

## 2020-09-16 MED ORDER — SODIUM CHLORIDE 0.9% FLUSH
3.0000 mL | Freq: Two times a day (BID) | INTRAVENOUS | Status: DC
  Administered 2020-09-16: 3 mL via INTRAVENOUS

## 2020-09-16 MED ORDER — SODIUM CHLORIDE 0.9 % WEIGHT BASED INFUSION
1.0000 mL/kg/h | INTRAVENOUS | Status: DC
  Administered 2020-09-16: 1 mL/kg/h via INTRAVENOUS

## 2020-09-16 MED ORDER — LIDOCAINE HCL (PF) 1 % IJ SOLN
INTRAMUSCULAR | Status: DC | PRN
  Administered 2020-09-16: 2 mL

## 2020-09-16 MED ORDER — AMOXICILLIN-POT CLAVULANATE 875-125 MG PO TABS
1.0000 | ORAL_TABLET | Freq: Two times a day (BID) | ORAL | Status: DC
  Administered 2020-09-16 – 2020-09-25 (×18): 1 via ORAL
  Filled 2020-09-16 (×18): qty 1

## 2020-09-16 MED ORDER — HYDRALAZINE HCL 20 MG/ML IJ SOLN
INTRAMUSCULAR | Status: DC | PRN
  Administered 2020-09-16: 10 mg via INTRAVENOUS

## 2020-09-16 MED ORDER — ISOSORBIDE MONONITRATE ER 60 MG PO TB24
60.0000 mg | ORAL_TABLET | Freq: Every day | ORAL | Status: DC
  Administered 2020-09-16: 60 mg via ORAL
  Filled 2020-09-16 (×2): qty 1

## 2020-09-16 MED ORDER — LIDOCAINE HCL (PF) 1 % IJ SOLN
INTRAMUSCULAR | Status: AC
  Filled 2020-09-16: qty 30

## 2020-09-16 MED ORDER — FENTANYL CITRATE (PF) 100 MCG/2ML IJ SOLN
INTRAMUSCULAR | Status: AC
  Filled 2020-09-16: qty 2

## 2020-09-16 MED ORDER — AMLODIPINE BESYLATE 5 MG PO TABS
5.0000 mg | ORAL_TABLET | Freq: Every day | ORAL | Status: DC
  Administered 2020-09-16 – 2020-09-17 (×2): 5 mg via ORAL
  Filled 2020-09-16 (×2): qty 1

## 2020-09-16 MED ORDER — SODIUM CHLORIDE 0.9 % WEIGHT BASED INFUSION
3.0000 mL/kg/h | INTRAVENOUS | Status: DC
  Administered 2020-09-16: 3 mL/kg/h via INTRAVENOUS

## 2020-09-16 MED ORDER — SODIUM CHLORIDE 0.9% FLUSH: 3.0000 mL | Freq: Two times a day (BID) | INTRAVENOUS | Status: DC

## 2020-09-16 MED ORDER — SODIUM CHLORIDE 0.9 % WEIGHT BASED INFUSION: 3.0000 mL/kg/h | INTRAVENOUS | Status: AC

## 2020-09-16 MED ORDER — SODIUM CHLORIDE 0.9% FLUSH
3.0000 mL | Freq: Two times a day (BID) | INTRAVENOUS | Status: DC
  Administered 2020-09-16 – 2020-09-27 (×21): 3 mL via INTRAVENOUS

## 2020-09-16 MED ORDER — SODIUM CHLORIDE 0.9% FLUSH
3.0000 mL | INTRAVENOUS | Status: DC | PRN
  Administered 2020-09-16: 3 mL via INTRAVENOUS

## 2020-09-16 MED ORDER — ASPIRIN 81 MG PO CHEW: 81.0000 mg | CHEWABLE_TABLET | ORAL | Status: DC

## 2020-09-16 MED ORDER — HYDRALAZINE HCL 20 MG/ML IJ SOLN
INTRAMUSCULAR | Status: AC
  Filled 2020-09-16: qty 1

## 2020-09-16 MED ORDER — HEPARIN (PORCINE) IN NACL 1000-0.9 UT/500ML-% IV SOLN
INTRAVENOUS | Status: DC | PRN
  Administered 2020-09-16 (×2): 500 mL

## 2020-09-16 MED ORDER — VERAPAMIL HCL 2.5 MG/ML IV SOLN
INTRAVENOUS | Status: DC | PRN
  Administered 2020-09-16: 10 mL via INTRA_ARTERIAL

## 2020-09-16 MED ORDER — SODIUM CHLORIDE 0.9 % IV SOLN: INTRAVENOUS | Status: AC

## 2020-09-16 MED ORDER — HEPARIN SODIUM (PORCINE) 1000 UNIT/ML IJ SOLN
INTRAMUSCULAR | Status: DC | PRN
  Administered 2020-09-16: 4000 [IU] via INTRAVENOUS

## 2020-09-16 MED ORDER — NEBIVOLOL HCL 2.5 MG PO TABS
2.5000 mg | ORAL_TABLET | Freq: Every morning | ORAL | Status: DC
  Administered 2020-09-17 – 2020-09-24 (×8): 2.5 mg via ORAL
  Filled 2020-09-16 (×9): qty 1

## 2020-09-16 SURGICAL SUPPLY — 12 items
CATH INFINITI 5 FR JL3.5 (CATHETERS) ×1 IMPLANT
CATH INFINITI 5FR JK (CATHETERS) ×1 IMPLANT
CATH INFINITI JR4 5F (CATHETERS) ×1 IMPLANT
DEVICE RAD COMP TR BAND LRG (VASCULAR PRODUCTS) ×1 IMPLANT
GLIDESHEATH SLEND SS 6F .021 (SHEATH) ×1 IMPLANT
GUIDEWIRE INQWIRE 1.5J.035X260 (WIRE) IMPLANT
INQWIRE 1.5J .035X260CM (WIRE) ×2
KIT HEART LEFT (KITS) ×2 IMPLANT
PACK CARDIAC CATHETERIZATION (CUSTOM PROCEDURE TRAY) ×2 IMPLANT
TRANSDUCER W/STOPCOCK (MISCELLANEOUS) ×2 IMPLANT
TUBING CIL FLEX 10 FLL-RA (TUBING) ×2 IMPLANT
WIRE HI TORQ VERSACORE-J 145CM (WIRE) ×1 IMPLANT

## 2020-09-16 NOTE — Progress Notes (Signed)
PROGRESS NOTE    Jennifer [name]  EXB:[id number] DOB: May 21, 1945 DOA: 09/15/2020 PCP: [facility]    Chief Complaint  Patient presents with   Chest Pain    hypertensive    Brief Narrative:  75 year old female with past medical history of hypertension, hyperlipidemia, gastroesophageal reflux disease, interstitial cystitis requiring intermittent self-catheterization, chronic pain syndrome secondary to lumbar stenosis who presents to [facility] emergency department at the instruction of Dr. [name] with cardiology for evaluation of chest discomfort and hypertensive urgency. She underwent CT angiogram of the coronary arteries per cardiology recommendations.  This revealed moderate stenosis with CT FFR pending. She underwent cardiac catheterization and cardiology recommended medical management.     Assessment & Plan:   Principal Problem:   Hypertensive urgency Active Problems:   GERD (gastroesophageal reflux disease)   Chest pain   Interstitial cystitis (chronic) without hematuria   Chronic low back pain   Chronic diastolic CHF (congestive heart failure) (HCC)   Unstable angina (HCC)   Hypertensive crisis Blood pressure parameters are improving. She is currently on amlodipine 5 mg daily, hydralazine 50 mg TID, Losartan 100 mg daily and bystolic 5 mg daily, .   Chronic diastolic heart failure Patient appears to be compensated at this time. Diagram showed left ventricular ejection fraction of 60 to 65% without any regional wall abnormalities, moderate left ventricular hypertrophy with grade 1 diastolic dysfunction.  Chest pain with both typical and atypical features Cardiology consulted and she underwent cardiac catheterization showing single vessel disease, unable to place a stent due to tortuosity of the vessel and extensive disease distal to the stenosis. Cardiology recommends maximal medical management. Continue with the Lipitor, optimal  blood pressure control.   Chronic interstitial cystitis without hematuria Patient follows up with urology as outpatient and she was recently put on course of Augmentin to complete. Patient also intermittently does self catheterizations.  Recommend getting urine cultures    Chronic low back pain Continue with pain medications at this time.   GERD Stable.   DVT prophylaxis: (Lovenox) Code Status: (Full CODE) Family Communication: (none at bedside) Disposition:   Status is: Observation  The patient will require care spanning > 2 midnights and should be moved to inpatient because: Ongoing diagnostic testing needed not appropriate for outpatient work up and Inpatient level of care appropriate due to severity of illness  Dispo: The patient is from: Home              Anticipated d/c is to:  pending              Patient currently is not medically stable to d/c.   Difficult to place patient No       Consultants:  cardiology  Procedures: Cardiac catheterization  Antimicrobials:  Antibiotics Given (last 72 hours)     Date/Time Action Medication Dose   09/15/20 2258 Given   amoxicillin-clavulanate (AUGMENTIN) 500-125 MG per tablet 500 mg 500 mg        Subjective: Patient denies any chest pain or shortness of breath at this time, no nausea vomiting or abdominal pain.  Objective: Vitals:   09/16/20 0553 09/16/20 0653 09/16/20 0835 09/16/20 0913  BP: (!) 171/71 119/63 (!) 165/71   Pulse:  (!) 51 (!) 56   Resp:  18 18   Temp:      TempSrc:      SpO2:  97% 98% 98%  Weight:      Height:  Intake/Output Summary (Last 24 hours) at 09/16/2020 1054 Last data filed at 09/16/2020 0700 Gross per 24 hour  Intake 297.92 ml  Output --  Net 297.92 ml   Filed Weights   09/15/20 1226 09/16/20 0142  Weight: 91.2 kg 90.3 kg    Examination:  General exam: Appears calm and comfortable  Respiratory system: Clear to auscultation. Respiratory effort  normal. Cardiovascular system: S1 & S2 heard, RRR. No JVD,  No pedal edema. Gastrointestinal system: Abdomen is nondistended, soft and nontender. Normal bowel sounds heard. Central nervous system: Alert and oriented. No focal neurological deficits. Extremities: Symmetric 5 x 5 power. Skin: No rashes, lesions or ulcers Psychiatry:  Mood & affect appropriate.     Data Reviewed: I have personally reviewed following labs and imaging studies  CBC: Recent Labs  Lab 09/15/20 1218  WBC 7.4  HGB 11.6*  HCT 35.7*  MCV 88.1  PLT 258    Basic Metabolic Panel: Recent Labs  Lab 09/15/20 1218  NA 140  K 3.6  CL 106  CO2 24  GLUCOSE 101*  BUN 8  CREATININE 0.71  CALCIUM 8.6*    GFR: Estimated Creatinine Clearance: 70.5 mL/min (by C-G formula based on SCr of 0.71 mg/dL).  Liver Function Tests: No results for input(s): AST, ALT, ALKPHOS, BILITOT, PROT, ALBUMIN in the last 168 hours.  CBG: No results for input(s): GLUCAP in the last 168 hours.   Recent Results (from the past 240 hour(s))  SARS CORONAVIRUS 2 (TAT 6-24 HRS) Nasopharyngeal Nasopharyngeal Swab     Status: None   Collection Time: 09/15/20  9:01 PM   Specimen: Nasopharyngeal Swab  Result Value Ref Range Status   SARS Coronavirus 2 NEGATIVE NEGATIVE Final    Comment: (NOTE) SARS-CoV-2 target nucleic acids are NOT DETECTED.  The SARS-CoV-2 RNA is generally detectable in upper and lower respiratory specimens during the acute phase of infection. Negative results do not preclude SARS-CoV-2 infection, do not rule out co-infections with other pathogens, and should not be used as the sole basis for treatment or other patient management decisions. Negative results must be combined with clinical observations, patient history, and epidemiological information. The expected result is Negative.  Fact Sheet for Patients: HairSlick.no  Fact Sheet for Healthcare  Providers: quierodirigir.com  This test is not yet approved or cleared by the [place] FDA and  has been authorized for detection and/or diagnosis of SARS-CoV-2 by FDA under an Emergency Use Authorization (EUA). This EUA will remain  in effect (meaning this test can be used) for the duration of the COVID-19 declaration under Se ction 564(b)(1) of the Act, 21 U.S.C. section 360bbb-3(b)(1), unless the authorization is terminated or revoked sooner.  Performed at [facility], [address], [place], [place] [address]          Radiology Studies: CT Head Wo Contrast  Result Date: 09/15/2020 CLINICAL DATA:  Headache EXAM: CT HEAD WITHOUT CONTRAST TECHNIQUE: Contiguous axial images were obtained from the base of the skull through the vertex without intravenous contrast. COMPARISON:  None. FINDINGS: Brain: Normal anatomic configuration. Parenchymal volume loss is commensurate with the patient's age. Mild periventricular white matter changes are present likely reflecting the sequela of small vessel ischemia. No abnormal intra or extra-axial mass lesion or fluid collection. No abnormal mass effect or midline shift. No evidence of acute intracranial hemorrhage or infarct. Ventricular size is normal. Cerebellum unremarkable. Vascular: No asymmetric hyperdense vasculature at the skull base. Skull: Intact Sinuses/Orbits: Mucous retention cyst noted within  the right maxillary sinus. Remaining paranasal sinuses are clear. Orbits are unremarkable. Other: Mastoid air cells and middle ear cavities are clear. IMPRESSION: No acute intracranial abnormality.  Mild senescent change. Electronically Signed   By: Helyn Numbers MD   On: 09/15/2020 15:53   CARDIAC CATHETERIZATION  Result Date: 09/16/2020  Mid LAD lesion is 50% stenosed.  3rd Diag lesion is 60% stenosed.  Dist LAD-1 lesion is 90% stenosed.  Dist LAD-2 lesion is 40% stenosed.  1.  Significant one-vessel coronary  artery disease involving the mid to distal LAD at the bifurcation of the third diagonal which has also 60% ostial stenosis.  The coronary arteries are very tortuous suggestive of hypertensive heart disease. 2.  Left ventricular angiography was not performed.  EF was normal by echo. 3.  Severely elevated blood pressure with mildly elevated left ventricular end-diastolic pressure. 4.  Transient left bundle branch block after engaging the left ventricle.  This resolved by the end of the case. 5.  Abdominal aortogram was performed and showed no evidence of renal artery stenosis. Recommendations: Unfortunately, the LAD stenosis is in a very tortuous segment and at the origin of the third diagonal branch which also has ostial disease.  PCI in this area will be high risk for dissection as well as sidebranch closure especially in the setting of uncontrolled hypertension.  In addition, the distal LAD after the stenosis is diffusely diseased. I recommend maximizing medical therapy and controlling blood pressure before considering PCI.  I added amlodipine to her antihypertensive medications. Other complicating issue is that the patient cannot tolerate aspirin long-term.  If we decide to proceed with PCI at some point, we will have to make sure she tolerates ticagrelor which can likely be used as monotherapy.   PERIPHERAL VASCULAR CATHETERIZATION  Result Date: 09/16/2020  Mid LAD lesion is 50% stenosed.  3rd Diag lesion is 60% stenosed.  Dist LAD-1 lesion is 90% stenosed.  Dist LAD-2 lesion is 40% stenosed.  1.  Significant one-vessel coronary artery disease involving the mid to distal LAD at the bifurcation of the third diagonal which has also 60% ostial stenosis.  The coronary arteries are very tortuous suggestive of hypertensive heart disease. 2.  Left ventricular angiography was not performed.  EF was normal by echo. 3.  Severely elevated blood pressure with mildly elevated left ventricular end-diastolic pressure.  4.  Transient left bundle branch block after engaging the left ventricle.  This resolved by the end of the case. 5.  Abdominal aortogram was performed and showed no evidence of renal artery stenosis. Recommendations: Unfortunately, the LAD stenosis is in a very tortuous segment and at the origin of the third diagonal branch which also has ostial disease.  PCI in this area will be high risk for dissection as well as sidebranch closure especially in the setting of uncontrolled hypertension.  In addition, the distal LAD after the stenosis is diffusely diseased. I recommend maximizing medical therapy and controlling blood pressure before considering PCI.  I added amlodipine to her antihypertensive medications. Other complicating issue is that the patient cannot tolerate aspirin long-term.  If we decide to proceed with PCI at some point, we will have to make sure she tolerates ticagrelor which can likely be used as monotherapy.   CT CORONARY MORPH W/CTA COR W/SCORE W/CA W/CM &/OR WO/CM  Addendum Date: 09/15/2020   ADDENDUM REPORT: 09/15/2020 18:26 CLINICAL DATA:  75 Year-old Caucasian Female EXAM: Cardiac/Coronary  CTA TECHNIQUE: The patient was scanned on a [employer]  scanner. FINDINGS: A 100 kV prospective scan was triggered in the descending thoracic aorta at 111 HU's. Axial non-contrast 3 mm slices were carried out through the heart. The data set was analyzed on a dedicated work station and scored using the Agatson method. Gantry rotation speed was 250 msecs and collimation was .6 mm. No beta blockade and 0.8 mg of sl NTG was given. The 3D data set was reconstructed in 5% intervals of the 67-82 % of the R-R cycle. Diastolic phases were analyzed on a dedicated work station using MPR, MIP and VRT modes. The patient received 100 cc of contrast. Aorta: Ascending aorta is mildly dilated for age and body surface area: 40 mm. No calcifications in cardiac field of view. No dissection. Main Pulmonary Artery: Mild  dilation of the pulmonary artery: 32 mm with artery to aorta ratio of < 0.9. Aortic Valve:  Tri-leaflet.  Mild annular calcification. Coronary Arteries:  Normal coronary origin.  Right dominance. Coronary Calcium Score: Left main: 0 Left anterior descending artery: 156 Left circumflex artery: 0 Right coronary artery: 1 Total: 157 Percentile: 74th for age, sex, and race matched control. RCA is a large dominant artery that gives rise to PDA and PLA. There is no plaque. Left main is a large artery that gives rise to LAD and LCX arteries. There is no plaque. LAD is a large vessel that gives rise to one small D1 Branch. There is a mild non obstructive stenosis (25-49%) calcific plaque in the proximal LAD. There is a moderate stenosis (50-70%) calcific plaque in the mid LAD. There is a moderate stenosis (50-70%) calcific plaque in the distal LAD. LCX is a non-dominant artery.  There is no plaque. There is a ramus intermedius vessel with minimal non-obstructive (1-24%) soft plaque in the mid vessel. Other findings: Normal pulmonary vein drainage into the left atrium. Normal left atrial appendage without a thrombus. Cannot exclude small patent foramen ovale. Extra-cardiac findings: See attached radiology report for non-cardiac structures. IMPRESSION: 1. Coronary calcium score of 157. This was 74th percentile for age, sex, and race matched control. 2. Normal coronary origin with right dominance. 3. CAD-RADS 3. Moderate stenosis. Consider symptom-guided anti-ischemic pharmacotherapy as well as risk factor modification per guideline directed care. Additional analysis with CT FFR will be submitted. 4. Ascending aorta is mildly dilation for age and body surface area: 40 mm. 5. Mild dilation of the pulmonary artery. 6. Cannot exclude small patent foramen ovale. Reaching out to primary team. RECOMMENDATIONS: Coronary artery calcium (CAC) score is a strong predictor of incident coronary heart disease (CHD) and provides predictive  information beyond traditional risk factors. CAC scoring is reasonable to use in the decision to withhold, postpone, or initiate statin therapy in intermediate-risk or selected borderline-risk asymptomatic adults (age 6-75 years and LDL-C >=70 to <190 mg/dL) who do not have diabetes or established atherosclerotic cardiovascular disease (ASCVD).* In intermediate-risk (10-year ASCVD risk >=7.5% to <20%) adults or selected borderline-risk (10-year ASCVD risk >=5% to <7.5%) adults in whom a CAC score is measured for the purpose of making a treatment decision the following recommendations have been made: If CAC = 0, it is reasonable to withhold statin therapy and reassess in 5 to 10 years, as long as higher risk conditions are absent (diabetes mellitus, family history of premature CHD in first degree relatives (males <55 years; females <65 years), cigarette smoking, LDL >=190 mg/dL or other independent risk factors). If CAC is 1 to 99, it is reasonable to initiate statin therapy for patients ?  73 years of age. If CAC is >=100 or >=75th percentile, it is reasonable to initiate statin therapy at any age. Cardiology referral should be considered for patients with CAC scores =400 or >=75th percentile. *2018 AHA/ACC/AACVPR/AAPA/ABC/ACPM/ADA/AGS/APhA/ASPC/NLA/PCNA Guideline on the Management of Blood Cholesterol: A Report of the American College of Cardiology/American Heart Association Task Force on Clinical Practice Guidelines. J Am Coll Cardiol. 2019;73(24):3168-3209. Riley Lam, MD Electronically Signed   By: Riley Lam MD   On: 09/15/2020 18:26   Result Date: 09/15/2020 EXAM: OVER-READ INTERPRETATION  CT CHEST The following report is an over-read performed by radiologist Dr. Jeronimo Greaves of [facility] on 09/15/2020. This over-read does not include interpretation of cardiac or coronary anatomy or pathology. The coronary CTA interpretation by the cardiologist is attached. COMPARISON:   Chest radiograph of earlier today. FINDINGS: Vascular: Upper normal ascending aortic caliber, including 3.9 cm on 09/10. Aortic atherosclerosis. No central pulmonary embolism, on this non-dedicated study. Mediastinum/Nodes: No imaged thoracic adenopathy. Tiny hiatal hernia. Lungs/Pleura: No pleural fluid.  Clear imaged lungs. Upper Abdomen: Moderate hepatic steatosis. Normal imaged portions of the spleen. Musculoskeletal: Mild-to-moderate convex right lower thoracic spinal curvature. IMPRESSION: 1. No acute findings in the imaged extracardiac chest. 2. Hepatic steatosis. 3. Tiny hiatal hernia. 4. Aortic Atherosclerosis (ICD10-I70.0). Electronically Signed: By: Jeronimo Greaves M.D. On: 09/15/2020 15:51   DG Chest Port 1 View  Result Date: 09/15/2020 CLINICAL DATA:  Chest pain EXAM: PORTABLE CHEST 1 VIEW COMPARISON:  09/08/2020 FINDINGS: Cardiac enlargement without heart failure. Lungs clear without infiltrate or effusion Thoracic scoliosis.  Azygos lobe fissure noted. IMPRESSION: No active disease. Electronically Signed   By: Marlan Palau M.D.   On: 09/15/2020 12:38   ECHOCARDIOGRAM COMPLETE  Result Date: 09/15/2020    ECHOCARDIOGRAM REPORT   Patient Name:   Jennifer [name] Date of Exam: 09/15/2020 Medical Rec #:  [id number]        Height:       65.0 in Accession #:    [id number]       Weight:       201.0 lb Date of Birth:  1945-04-06        BSA:          1.982 m Patient Age:    74 years         BP:           218/94 mmHg Patient Gender: F                HR:           55 bpm. Exam Location:  [address] Procedure: 2D Echo, 3D Echo, Cardiac Doppler, Color Doppler and Strain Analysis Indications:    R01.1 Murmur  History:        Patient has prior history of Echocardiogram examinations, most                 recent 07/11/2017. Signs/Symptoms:Murmur; Risk                 Factors:Hypertension, Sleep Apnea and Dyslipidemia.  Sonographer:    Garald Braver, RDCS Referring Phys: [id number] JEFFREY C HATCHER IMPRESSIONS  1.  Left ventricular ejection fraction, by estimation, is 60 to 65%. The left ventricle has normal function. The left ventricle has no regional wall motion abnormalities. There is moderate left ventricular hypertrophy. Left ventricular diastolic parameters are consistent with Grade I diastolic dysfunction (impaired relaxation). Elevated left atrial pressure. The average left ventricular global longitudinal strain is -20.4 %.  The global longitudinal strain is normal.  2. Right ventricular systolic function is normal. The right ventricular size is normal.  3. The mitral valve is normal in structure. Trivial mitral valve regurgitation. No evidence of mitral stenosis.  4. The aortic valve is tricuspid. Aortic valve regurgitation is not visualized. Mild aortic valve stenosis.  5. Aortic dilatation noted. There is mild dilatation of the ascending aorta, measuring 43 mm.  6. The inferior vena cava is normal in size with greater than 50% respiratory variability, suggesting right atrial pressure of 3 mmHg. Comparison(s): 07/11/17 EF 60-65%. PA pressure . FINDINGS  Left Ventricle: Left ventricular ejection fraction, by estimation, is 60 to 65%. The left ventricle has normal function. The left ventricle has no regional wall motion abnormalities. The average left ventricular global longitudinal strain is -20.4 %. The global longitudinal strain is normal. The left ventricular internal cavity size was normal in size. There is moderate left ventricular hypertrophy. Left ventricular diastolic parameters are consistent with Grade I diastolic dysfunction (impaired relaxation). Elevated left atrial pressure. Right Ventricle: The right ventricular size is normal. Right ventricular systolic function is normal. Left Atrium: Left atrial size was normal in size. Right Atrium: Right atrial size was normal in size. Pericardium: There is no evidence of pericardial effusion. Mitral Valve: The mitral valve is normal in structure. Trivial  mitral valve regurgitation. No evidence of mitral valve stenosis. Tricuspid Valve: The tricuspid valve is normal in structure. Tricuspid valve regurgitation is mild . No evidence of tricuspid stenosis. Aortic Valve: The aortic valve is tricuspid. Aortic valve regurgitation is not visualized. Mild aortic stenosis is present. Aortic valve mean gradient measures 10.7 mmHg. Aortic valve peak gradient measures 20.0 mmHg. Aortic valve area, by VTI measures 2.50 cm. Pulmonic Valve: The pulmonic valve was normal in structure. Pulmonic valve regurgitation is trivial. No evidence of pulmonic stenosis. Aorta: Aortic dilatation noted. There is mild dilatation of the ascending aorta, measuring 43 mm. Venous: The inferior vena cava is normal in size with greater than 50% respiratory variability, suggesting right atrial pressure of 3 mmHg. IAS/Shunts: No atrial level shunt detected by color flow Doppler.  LEFT VENTRICLE PLAX 2D LVIDd:         4.80 cm  Diastology LVIDs:         2.30 cm  LV e' medial:    4.46 cm/s LV PW:         1.20 cm  LV E/e' medial:  17.5 LV IVS:        1.20 cm  LV e' lateral:   5.11 cm/s LVOT diam:     2.10 cm  LV E/e' lateral: 15.3 LV SV:         135 LV SV Index:   68       2D Longitudinal Strain LVOT Area:     3.46 cm 2D Strain GLS (A2C):   -23.5 %                         2D Strain GLS (A3C):   -19.9 %                         2D Strain GLS (A4C):   -17.7 %                         2D Strain GLS Avg:     -20.4 %  3D Volume EF:                         3D EF:        59 %                         LV EDV:       118 ml                         LV ESV:       48 ml                         LV SV:        70 ml RIGHT VENTRICLE RV Basal diam:  3.60 cm RV S prime:     14.70 cm/s TAPSE (M-mode): 2.7 cm LEFT ATRIUM             Index       RIGHT ATRIUM           Index LA diam:        4.30 cm 2.17 cm/m  RA Pressure: 3.00 mmHg LA Vol (A2C):   53.7 ml 27.09 ml/m RA Area:     11.50 cm LA Vol (A4C):    44.3 ml 22.35 ml/m RA Volume:   23.70 ml  11.96 ml/m LA Biplane Vol: 50.0 ml 25.22 ml/m  AORTIC VALVE AV Area (Vmax):    2.12 cm AV Area (Vmean):   1.99 cm AV Area (VTI):     2.50 cm AV Vmax:           223.67 cm/s AV Vmean:          152.000 cm/s AV VTI:            0.541 m AV Peak Grad:      20.0 mmHg AV Mean Grad:      10.7 mmHg LVOT Vmax:         137.00 cm/s LVOT Vmean:        87.400 cm/s LVOT VTI:          0.390 m LVOT/AV VTI ratio: 0.72  AORTA Ao Root diam: 3.50 cm Ao Asc diam:  4.15 cm MITRAL VALVE               TRICUSPID VALVE                            Estimated RAP:  3.00 mmHg  MV E velocity: 78.00 cm/s  SHUNTS MV A velocity: 84.40 cm/s  Systemic VTI:  0.39 m MV E/A ratio:  0.92        Systemic Diam: 2.10 cm Olga MillersBrian Crenshaw MD Electronically signed by Olga MillersBrian Crenshaw MD Signature Date/Time: 09/15/2020/12:53:52 PM    Final         Scheduled Meds:  amoxicillin-clavulanate  1 tablet Oral TID   atorvastatin  40 mg Oral Daily   enoxaparin (LOVENOX) injection  40 mg Subcutaneous Q24H   fluticasone  1 spray Each Nare Daily   hydrALAZINE  25 mg Oral Q6H   losartan  100 mg Oral Daily   nebivolol  5 mg Oral q morning   pantoprazole  40 mg Oral Daily   sodium chloride flush  3 mL Intravenous Q12H   Continuous Infusions:   LOS: 0 days  Kathlen Mody, MD [facility]   To contact the attending provider between 7A-7P or the covering provider during after hours 7P-7A, please log into the web site [email or web address] and access using universal [place] password for that web site. If you do not have the password, please call the hospital operator.  09/16/2020, 10:54 AM

## 2020-09-16 NOTE — Interval H&P Note (Signed)
Cath Lab Visit (complete for each Cath Lab visit)  Clinical Evaluation Leading to the Procedure:   ACS: Yes.   Unstable angina  Non-ACS:  n/a     History and Physical Interval Note:  09/16/2020 9:42 AM  Jennifer [name]  has presented today for surgery, with the diagnosis of unstable angina.  The various methods of treatment have been discussed with the patient and family. After consideration of risks, benefits and other options for treatment, the patient has consented to  Procedure(s): LEFT HEART CATH AND CORONARY ANGIOGRAPHY (N/A) as a surgical intervention.  The patient's history has been reviewed, patient examined, no change in status, stable for surgery.  I have reviewed the patient's chart and labs.  Questions were answered to the patient's satisfaction.     [name]

## 2020-09-16 NOTE — Progress Notes (Signed)
Mobility Specialist: Progress Note   09/16/20 1621  Mobility  Activity  (Cancel)   Pt with c/o feeling nauseous upon entering room, RN present in the room. Will f/u as able.   [facility] Santi Troung Mobility Specialist Mobility Specialist Phone: [phone]

## 2020-09-16 NOTE — Progress Notes (Addendum)
Progress Note  Patient Name: RHONA FUSILIER Date of Encounter: 09/16/2020  [facility] Cardiologist: Christell Constant, MD new  Subjective   Had CP overnight, relieved by SL NTG x 2  Inpatient Medications    Scheduled Meds:  amoxicillin-clavulanate  1 tablet Oral TID   atorvastatin  40 mg Oral Daily   enoxaparin (LOVENOX) injection  40 mg Subcutaneous Q24H   fluticasone  1 spray Each Nare Daily   hydrALAZINE  25 mg Oral Q6H   losartan  100 mg Oral Daily   nebivolol  5 mg Oral q morning   pantoprazole  40 mg Oral Daily   sodium chloride flush  3 mL Intravenous Q12H   Continuous Infusions:  sodium chloride     sodium chloride 1 mL/kg/hr (09/16/20 0653)   PRN Meds: sodium chloride, acetaminophen, hydrALAZINE, HYDROcodone-acetaminophen, nitroGLYCERIN, sodium chloride flush   Vital Signs    Vitals:   09/16/20 0321 09/16/20 0552 09/16/20 0553 09/16/20 0653  BP: 135/62 (!) 171/71 (!) 171/71 119/63  Pulse: (!) 57 (!) 57  (!) 51  Resp: Temp: 98.3 F (36.8 C) 98.2 F (36.8 C)    TempSrc: Oral Oral    SpO2: 95% 99%  97%  Weight:      Height:        Intake/Output Summary (Last 24 hours) at 09/16/2020 1610 Last data filed at 09/16/2020 0700 Gross per 24 hour  Intake 297.92 ml  Output --  Net 297.92 ml   Last 3 Weights 09/16/2020 09/15/2020 09/08/2020  Weight (lbs) 199 lb 201 lb 201 lb  Weight (kg) 90.266 kg 91.173 kg 91.173 kg      Telemetry    SR, Sinus brady high 40s while asleep, rare PVCs - Personally Reviewed  ECG    Sinus brady HR 59, inferior Q waves - Personally Reviewed  Physical Exam   GEN: No acute distress.   Neck: No JVD Cardiac: RRR, 2/6 murmur, no rubs, or gallops.  Respiratory: Clear to auscultation bilaterally. GI: Soft, nontender, non-distended  MS: No edema; No deformity. Neuro:  Nonfocal  Psych: Normal affect   Labs    High Sensitivity Troponin:   Recent Labs  Lab 09/08/20 1942 09/08/20 2251 09/15/20 1218  09/15/20 1418 09/16/20 0238  TROPONINIHS Chemistry Recent Labs  Lab 09/15/20 1218  NA 140  K 3.6  CL 106  CO2 24  GLUCOSE 101*  BUN 8  CREATININE 0.71  CALCIUM 8.6*  GFRNONAA >60  ANIONGAP 10     Hematology Recent Labs  Lab 09/15/20 1218  WBC 7.4  RBC 4.05  HGB 11.6*  HCT 35.7*  MCV 88.1  MCH 28.6  MCHC 32.5  RDW 12.9  PLT 258   Lab Results  Component Value Date   TSH 3.053 09/16/2020   No results found for: HGBA1C No results found for: CHOL, HDL, LDLCALC, LDLDIRECT, TRIG, CHOLHDL  BNPNo results for input(s): BNP, PROBNP in the last 168 hours.   DDimer No results for input(s): DDIMER in the last 168 hours.   Radiology    CT Head Wo Contrast  Result Date: 09/15/2020 CLINICAL DATA:  Headache EXAM: CT HEAD WITHOUT CONTRAST TECHNIQUE: Contiguous axial images were obtained from the base of the skull through the vertex without intravenous contrast. COMPARISON:  None. FINDINGS: Brain: Normal anatomic configuration. Parenchymal volume loss is commensurate with the patient's age. Mild periventricular white matter changes are present likely  reflecting the sequela of small vessel ischemia. No abnormal intra or extra-axial mass lesion or fluid collection. No abnormal mass effect or midline shift. No evidence of acute intracranial hemorrhage or infarct. Ventricular size is normal. Cerebellum unremarkable. Vascular: No asymmetric hyperdense vasculature at the skull base. Skull: Intact Sinuses/Orbits: Mucous retention cyst noted within the right maxillary sinus. Remaining paranasal sinuses are clear. Orbits are unremarkable. Other: Mastoid air cells and middle ear cavities are clear. IMPRESSION: No acute intracranial abnormality.  Mild senescent change. Electronically Signed   By: Helyn Numbers MD   On: 09/15/2020 15:53   CT CORONARY MORPH W/CTA COR W/SCORE W/CA W/CM &/OR WO/CM  Addendum Date: 09/15/2020   ADDENDUM REPORT: 09/15/2020 18:26 CLINICAL DATA:   75 Year-old Caucasian Female EXAM: Cardiac/Coronary  CTA TECHNIQUE: The patient was scanned on a [employer]. FINDINGS: A 100 kV prospective scan was triggered in the descending thoracic aorta at 111 HU's. Axial non-contrast 3 mm slices were carried out through the heart. The data set was analyzed on a dedicated work station and scored using the Agatson method. Gantry rotation speed was 250 msecs and collimation was .6 mm. No beta blockade and 0.8 mg of sl NTG was given. The 3D data set was reconstructed in 5% intervals of the 67-82 % of the R-R cycle. Diastolic phases were analyzed on a dedicated work station using MPR, MIP and VRT modes. The patient received 100 cc of contrast. Aorta: Ascending aorta is mildly dilated for age and body surface area: 40 mm. No calcifications in cardiac field of view. No dissection. Main Pulmonary Artery: Mild dilation of the pulmonary artery: 32 mm with artery to aorta ratio of < 0.9. Aortic Valve:  Tri-leaflet.  Mild annular calcification. Coronary Arteries:  Normal coronary origin.  Right dominance. Coronary Calcium Score: Left main: 0 Left anterior descending artery: 156 Left circumflex artery: 0 Right coronary artery: 1 Total: 157 Percentile: 74th for age, sex, and race matched control. RCA is a large dominant artery that gives rise to PDA and PLA. There is no plaque. Left main is a large artery that gives rise to LAD and LCX arteries. There is no plaque. LAD is a large vessel that gives rise to one small D1 Branch. There is a mild non obstructive stenosis (25-49%) calcific plaque in the proximal LAD. There is a moderate stenosis (50-70%) calcific plaque in the mid LAD. There is a moderate stenosis (50-70%) calcific plaque in the distal LAD. LCX is a non-dominant artery.  There is no plaque. There is a ramus intermedius vessel with minimal non-obstructive (1-24%) soft plaque in the mid vessel. Other findings: Normal pulmonary vein drainage into the left atrium. Normal  left atrial appendage without a thrombus. Cannot exclude small patent foramen ovale. Extra-cardiac findings: See attached radiology report for non-cardiac structures. IMPRESSION: 1. Coronary calcium score of 157. This was 74th percentile for age, sex, and race matched control. 2. Normal coronary origin with right dominance. 3. CAD-RADS 3. Moderate stenosis. Consider symptom-guided anti-ischemic pharmacotherapy as well as risk factor modification per guideline directed care. Additional analysis with CT FFR will be submitted. 4. Ascending aorta is mildly dilation for age and body surface area: 40 mm. 5. Mild dilation of the pulmonary artery. 6. Cannot exclude small patent foramen ovale. Reaching out to primary team. RECOMMENDATIONS: Coronary artery calcium (CAC) score is a strong predictor of incident coronary heart disease (CHD) and provides predictive information beyond traditional risk factors. CAC scoring is reasonable to use in the decision  to withhold, postpone, or initiate statin therapy in intermediate-risk or selected borderline-risk asymptomatic adults (age 70-75 years and LDL-C >=70 to <190 mg/dL) who do not have diabetes or established atherosclerotic cardiovascular disease (ASCVD).* In intermediate-risk (10-year ASCVD risk >=7.5% to <20%) adults or selected borderline-risk (10-year ASCVD risk >=5% to <7.5%) adults in whom a CAC score is measured for the purpose of making a treatment decision the following recommendations have been made: If CAC = 0, it is reasonable to withhold statin therapy and reassess in 5 to 10 years, as long as higher risk conditions are absent (diabetes mellitus, family history of premature CHD in first degree relatives (males <55 years; females <65 years), cigarette smoking, LDL >=190 mg/dL or other independent risk factors). If CAC is 1 to 99, it is reasonable to initiate statin therapy for patients ?73 years of age. If CAC is >=100 or >=75th percentile, it is reasonable to  initiate statin therapy at any age. Cardiology referral should be considered for patients with CAC scores =400 or >=75th percentile. *2018 AHA/ACC/AACVPR/AAPA/ABC/ACPM/ADA/AGS/APhA/ASPC/NLA/PCNA Guideline on the Management of Blood Cholesterol: A Report of the American College of Cardiology/American Heart Association Task Force on Clinical Practice Guidelines. J Am Coll Cardiol. 2019;73(24):3168-3209. Riley Lam, MD Electronically Signed   By: Riley Lam MD   On: 09/15/2020 18:26   Result Date: 09/15/2020 EXAM: OVER-READ INTERPRETATION  CT CHEST The following report is an over-read performed by radiologist Dr. Jeronimo Greaves of [facility] on 09/15/2020. This over-read does not include interpretation of cardiac or coronary anatomy or pathology. The coronary CTA interpretation by the cardiologist is attached. COMPARISON:  Chest radiograph of earlier today. FINDINGS: Vascular: Upper normal ascending aortic caliber, including 3.9 cm on 09/10. Aortic atherosclerosis. No central pulmonary embolism, on this non-dedicated study. Mediastinum/Nodes: No imaged thoracic adenopathy. Tiny hiatal hernia. Lungs/Pleura: No pleural fluid.  Clear imaged lungs. Upper Abdomen: Moderate hepatic steatosis. Normal imaged portions of the spleen. Musculoskeletal: Mild-to-moderate convex right lower thoracic spinal curvature. IMPRESSION: 1. No acute findings in the imaged extracardiac chest. 2. Hepatic steatosis. 3. Tiny hiatal hernia. 4. Aortic Atherosclerosis (ICD10-I70.0). Electronically Signed: By: Jeronimo Greaves M.D. On: 09/15/2020 15:51   DG Chest Port 1 View  Result Date: 09/15/2020 CLINICAL DATA:  Chest pain EXAM: PORTABLE CHEST 1 VIEW COMPARISON:  09/08/2020 FINDINGS: Cardiac enlargement without heart failure. Lungs clear without infiltrate or effusion Thoracic scoliosis.  Azygos lobe fissure noted. IMPRESSION: No active disease. Electronically Signed   By: Marlan Palau M.D.   On: 09/15/2020  12:38   ECHOCARDIOGRAM COMPLETE  Result Date: 09/15/2020    ECHOCARDIOGRAM REPORT   Patient Name:   CHASELYN NANNEY Date of Exam: 09/15/2020 Medical Rec #:  [id number]        Height:       65.0 in Accession #:    [id number]       Weight:       201.0 lb Date of Birth:  12-Oct-1945        BSA:          1.982 m Patient Age:    74 years         BP:           218/94 mmHg Patient Gender: F                HR:           55 bpm. Exam Location:  [address] Procedure: 2D Echo, 3D Echo, Cardiac Doppler, Color Doppler and  Strain Analysis Indications:    R01.1 Murmur  History:        Patient has prior history of Echocardiogram examinations, most                 recent 07/11/2017. Signs/Symptoms:Murmur; Risk                 Factors:Hypertension, Sleep Apnea and Dyslipidemia.  Sonographer:    Garald Braver, RDCS Referring Phys: [id number] JEFFREY C HATCHER IMPRESSIONS  1. Left ventricular ejection fraction, by estimation, is 60 to 65%. The left ventricle has normal function. The left ventricle has no regional wall motion abnormalities. There is moderate left ventricular hypertrophy. Left ventricular diastolic parameters are consistent with Grade I diastolic dysfunction (impaired relaxation). Elevated left atrial pressure. The average left ventricular global longitudinal strain is -20.4 %. The global longitudinal strain is normal.  2. Right ventricular systolic function is normal. The right ventricular size is normal.  3. The mitral valve is normal in structure. Trivial mitral valve regurgitation. No evidence of mitral stenosis.  4. The aortic valve is tricuspid. Aortic valve regurgitation is not visualized. Mild aortic valve stenosis.  5. Aortic dilatation noted. There is mild dilatation of the ascending aorta, measuring 43 mm.  6. The inferior vena cava is normal in size with greater than 50% respiratory variability, suggesting right atrial pressure of 3 mmHg. Comparison(s): 07/11/17 EF 60-65%. PA pressure . FINDINGS  Left  Ventricle: Left ventricular ejection fraction, by estimation, is 60 to 65%. The left ventricle has normal function. The left ventricle has no regional wall motion abnormalities. The average left ventricular global longitudinal strain is -20.4 %. The global longitudinal strain is normal. The left ventricular internal cavity size was normal in size. There is moderate left ventricular hypertrophy. Left ventricular diastolic parameters are consistent with Grade I diastolic dysfunction (impaired relaxation). Elevated left atrial pressure. Right Ventricle: The right ventricular size is normal. Right ventricular systolic function is normal. Left Atrium: Left atrial size was normal in size. Right Atrium: Right atrial size was normal in size. Pericardium: There is no evidence of pericardial effusion. Mitral Valve: The mitral valve is normal in structure. Trivial mitral valve regurgitation. No evidence of mitral valve stenosis. Tricuspid Valve: The tricuspid valve is normal in structure. Tricuspid valve regurgitation is mild . No evidence of tricuspid stenosis. Aortic Valve: The aortic valve is tricuspid. Aortic valve regurgitation is not visualized. Mild aortic stenosis is present. Aortic valve mean gradient measures 10.7 mmHg. Aortic valve peak gradient measures 20.0 mmHg. Aortic valve area, by VTI measures 2.50 cm. Pulmonic Valve: The pulmonic valve was normal in structure. Pulmonic valve regurgitation is trivial. No evidence of pulmonic stenosis. Aorta: Aortic dilatation noted. There is mild dilatation of the ascending aorta, measuring 43 mm. Venous: The inferior vena cava is normal in size with greater than 50% respiratory variability, suggesting right atrial pressure of 3 mmHg. IAS/Shunts: No atrial level shunt detected by color flow Doppler.  LEFT VENTRICLE PLAX 2D LVIDd:         4.80 cm  Diastology LVIDs:         2.30 cm  LV e' medial:    4.46 cm/s LV PW:         1.20 cm  LV E/e' medial:  17.5 LV IVS:        1.20 cm   LV e' lateral:   5.11 cm/s LVOT diam:     2.10 cm  LV E/e' lateral: 15.3 LV SV:  135 LV SV Index:   68       2D Longitudinal Strain LVOT Area:     3.46 cm 2D Strain GLS (A2C):   -23.5 %                         2D Strain GLS (A3C):   -19.9 %                         2D Strain GLS (A4C):   -17.7 %                         2D Strain GLS Avg:     -20.4 %                          3D Volume EF:                         3D EF:        59 %                         LV EDV:       118 ml                         LV ESV:       48 ml                         LV SV:        70 ml RIGHT VENTRICLE RV Basal diam:  3.60 cm RV S prime:     14.70 cm/s TAPSE (M-mode): 2.7 cm LEFT ATRIUM             Index       RIGHT ATRIUM           Index LA diam:        4.30 cm 2.17 cm/m  RA Pressure: 3.00 mmHg LA Vol (A2C):   53.7 ml 27.09 ml/m RA Area:     11.50 cm LA Vol (A4C):   44.3 ml 22.35 ml/m RA Volume:   23.70 ml  11.96 ml/m LA Biplane Vol: 50.0 ml 25.22 ml/m  AORTIC VALVE AV Area (Vmax):    2.12 cm AV Area (Vmean):   1.99 cm AV Area (VTI):     2.50 cm AV Vmax:           223.67 cm/s AV Vmean:          152.000 cm/s AV VTI:            0.541 m AV Peak Grad:      20.0 mmHg AV Mean Grad:      10.7 mmHg LVOT Vmax:         137.00 cm/s LVOT Vmean:        87.400 cm/s LVOT VTI:          0.390 m LVOT/AV VTI ratio: 0.72  AORTA Ao Root diam: 3.50 cm Ao Asc diam:  4.15 cm MITRAL VALVE               TRICUSPID VALVE                            Estimated RAP:  3.00 mmHg  MV E velocity: 78.00 cm/s  SHUNTS MV A velocity: 84.40 cm/s  Systemic VTI:  0.39 m MV E/A ratio:  0.92        Systemic Diam: 2.10 cm Olga Millers MD Electronically signed by Olga Millers MD Signature Date/Time: 09/15/2020/12:53:52 PM    Final     Cardiac Studies   Renal artery duplex: ordered Cardiac cath: ordered  CARDIAC CT: 09/15/2020 Aorta: Ascending aorta is mildly dilated for age and body surface area: 40 mm. No calcifications in cardiac field of view.  No dissection.   Main Pulmonary Artery: Mild dilation of the pulmonary artery: 32 mm with artery to aorta ratio of < 0.9.   Aortic Valve:  Tri-leaflet.  Mild annular calcification.   Coronary Arteries:  Normal coronary origin.  Right dominance.   Coronary Calcium Score:   Left main: 0   Left anterior descending artery: 156   Left circumflex artery: 0   Right coronary artery: 1   Total: 157   Percentile: 74th for age, sex, and race matched control.   RCA is a large dominant artery that gives rise to PDA and PLA. There is no plaque.   Left main is a large artery that gives rise to LAD and LCX arteries. There is no plaque.   LAD is a large vessel that gives rise to one small D1 Branch. There is a mild non obstructive stenosis (25-49%) calcific plaque in the proximal LAD. There is a moderate stenosis (50-70%) calcific plaque in the mid LAD. There is a moderate stenosis (50-70%) calcific plaque in the distal LAD.   LCX is a non-dominant artery.  There is no plaque.   There is a ramus intermedius vessel with minimal non-obstructive (1-24%) soft plaque in the mid vessel.   Other findings:   Normal pulmonary vein drainage into the left atrium.   Normal left atrial appendage without a thrombus.   Cannot exclude small patent foramen ovale.   Extra-cardiac findings: See attached radiology report for non-cardiac structures.   IMPRESSION: 1. Coronary calcium score of 157. This was 74th percentile for age, sex, and race matched control.   2. Normal coronary origin with right dominance.   3. CAD-RADS 3. Moderate stenosis. Consider symptom-guided anti-ischemic pharmacotherapy as well as risk factor modification per guideline directed care. Additional analysis with CT FFR will be submitted.   4. Ascending aorta is mildly dilation for age and body surface area: 40 mm.   5. Mild dilation of the pulmonary artery.   6. Cannot exclude small patent foramen ovale.    Reaching out to primary team.  ECHO: 09/15/2020  1. Left ventricular ejection fraction, by estimation, is 60 to 65%. The  left ventricle has normal function. The left ventricle has no regional  wall motion abnormalities. There is moderate left ventricular hypertrophy. Left ventricular diastolic parameters are consistent with Grade I diastolic dysfunction (impaired relaxation). Elevated left atrial pressure. The average left ventricular global longitudinal strain is -20.4 %. The global longitudinal strain is normal.   2. Right ventricular systolic function is normal. The right ventricular  size is normal.   3. The mitral valve is normal in structure. Trivial mitral valve  regurgitation. No evidence of mitral stenosis.   4. The aortic valve is tricuspid. Aortic valve regurgitation is not  visualized. Mild aortic valve stenosis.   5. Aortic dilatation noted. There is mild dilatation of the ascending  aorta, measuring 43 mm.   6. The inferior vena cava is normal  in size with greater than 50%  respiratory variability, suggesting right atrial pressure of 3 mmHg.   Comparison(s): 07/11/17 EF 60-65%. PA pressure .   Patient Profile     75 y.o. female  with a hx of hypertension, PVC, HLD, chronic pain syndrome with lumbar stenosis was admitted after she went to the office w/ CP and SBP 260 >> sent to ER >> cardiac CT w/ LAD stenosis >> admitted for cath.  Assessment & Plan    Unstable angina - pt had CP in setting of uncontrolled BP - minimal elevation in troponin, but significant LAD dz on cardiac CT - for cath today - recurrent pain relieved by nitro, add IV NTG and heparin  2. Mild AS - New dx, no sx, follow  3. HTN - per pt, BP has been high lately - for renal US today - continue  losartan 100 mg qd - home dose - home dose bystolic was 10 mg qd, now on 5 mg qd but has resting bradycardia - hydralazine 25 mg q 6 h is new, has had 2 doses so far   4. CRF screeining - Ck  lipids and A1c    For questions or updates, please contact [facility] Please consult [email or web address] for contact info under        Signed, Theodore Demark, PA-C  09/16/2020, 8:22 AM    Patient seen and examined and agree with Theodore Demark, PA-C as detailed above.  In brief, the patient is a 75 year old female with history of HTN, HLD, chronic pain syndrome with lumbar stenosis who was referred to the ER when she was found to have a blood pressure of 260 in her PCPs office with recent CTA coronaries on 09/15/20  with mLAD stenosis with FFR 0.67. Admitted for cath today.  LHC performed this morning showed:   Mid LAD lesion is 50% stenosed. 3rd Diag lesion is 60% stenosed. Dist LAD-1 lesion is 90% stenosed. Dist LAD-2 lesion is 40% stenosed.   1.  Significant one-vessel coronary artery disease involving the mid to distal LAD at the bifurcation of the third diagonal which has also 60% ostial stenosis.  The coronary arteries are very tortuous suggestive of hypertensive heart disease. 2.  Left ventricular angiography was not performed.  EF was normal by echo. 3.  Severely elevated blood pressure with mildly elevated left ventricular end-diastolic pressure. 4.  Transient left bundle branch block after engaging the left ventricle.  This resolved by the end of the case. 5.  Abdominal aortogram was performed and showed no evidence of renal artery stenosis.  Unfortunately the LAD disease is in a very tortuous segment at the origin of D3 which also has ostial disease. Deemed very high risk for dissection as well as sidebranch closure. Recommended for medical therapy prior to proceeding with PCI.   Currently, patient is chest pain free. Blood pressure remains elevated at 180s.   GEN: No acute distress.   Neck: No JVD Cardiac: RRR, no murmurs, rubs, or gallops.  Respiratory: Clear to auscultation bilaterally. GI: Soft, nontender, non-distended  MS: No edema; No deformity. Neuro:  Nonfocal   Psych: Normal affect    Plan: -LHC with 90% distal LAD in a very tortuous region with high risk of dissection and side-branch occlusion; recommended for medical therapy and only if refractory symptoms, would revisit cath -Notably, she is unable to tolerate long-term ASA (allergy is nausea so maybe could tolerate short course with PPI) and would likely need ticagrelor monotherapy  if PCI were pursued in the future -Continue lipitor 40mg  daily -Start amlopdine 10mg  daily -Continue losartan 100mg  daily -Continue bystolic 5mg  daily; unable to up-titrate due to bradycardia -Continue hydralazine 50mg  TID for now; will change to HCTZ if no yellow dye in the medication as patient is listed as allergic -Start imdur 60mg  daily -Will need aggressive blood pressure control going forward -Needs out-patient monitoring of aortic dilation (43mm ascending aorta) as out-patient  Laurance FlattenHeather Rhodia [place], MD

## 2020-09-17 DIAGNOSIS — I200 Unstable angina: Secondary | ICD-10-CM | POA: Diagnosis not present

## 2020-09-17 DIAGNOSIS — I160 Hypertensive urgency: Secondary | ICD-10-CM | POA: Diagnosis not present

## 2020-09-17 LAB — BASIC METABOLIC PANEL
Anion gap: 8 (ref 5–15)
BUN: 10 mg/dL (ref 8–23)
CO2: 26 mmol/L (ref 22–32)
Calcium: 8.8 mg/dL — ABNORMAL LOW (ref 8.9–10.3)
Chloride: 104 mmol/L (ref 98–111)
Creatinine, Ser: 0.74 mg/dL (ref 0.44–1.00)
GFR, Estimated: >60 mL/min (ref >60)
Glucose, Bld: 108 mg/dL — ABNORMAL HIGH (ref 70–99)
Potassium: 3.2 mmol/L — ABNORMAL LOW (ref 3.5–5.1)
Sodium: 138 mmol/L (ref 135–145)

## 2020-09-17 LAB — URINE CULTURE: Culture: NO GROWTH

## 2020-09-17 MED ORDER — MORPHINE SULFATE (PF) 2 MG/ML IV SOLN
0.5000 mg | INTRAVENOUS | Status: DC | PRN
Start: 2020-09-17 — End: 2020-09-22
  Administered 2020-09-17 – 2020-09-19 (×3): 1 mg via INTRAVENOUS
  Filled 2020-09-17 (×3): qty 1

## 2020-09-17 MED ORDER — AMLODIPINE BESYLATE 10 MG PO TABS
10.0000 mg | ORAL_TABLET | Freq: Every day | ORAL | Status: DC
  Administered 2020-09-18 – 2020-09-24 (×7): 10 mg via ORAL
  Filled 2020-09-17 (×7): qty 1

## 2020-09-17 MED ORDER — CLOPIDOGREL BISULFATE 75 MG PO TABS
75.0000 mg | ORAL_TABLET | Freq: Every day | ORAL | Status: DC
  Administered 2020-09-17 – 2020-09-27 (×11): 75 mg via ORAL
  Filled 2020-09-17 (×11): qty 1

## 2020-09-17 MED ORDER — AMLODIPINE BESYLATE 5 MG PO TABS
5.0000 mg | ORAL_TABLET | Freq: Once | ORAL | Status: AC
  Administered 2020-09-17: 5 mg via ORAL
  Filled 2020-09-17: qty 1

## 2020-09-17 NOTE — Progress Notes (Signed)
Pt requested to keep all side rails up.  Keola Heninger, RN 

## 2020-09-17 NOTE — Progress Notes (Signed)
Progress Note  Patient Name: Jennifer [name] Date of Encounter: 09/17/2020  [facility] Cardiologist: Christell Constant, MD   Subjective   Headache and nausea after imdur. Intermittent chest pains overnight.   Inpatient Medications    Scheduled Meds:  amLODipine  5 mg Oral Daily   amoxicillin-clavulanate  1 tablet Oral Q12H   atorvastatin  40 mg Oral Daily   enoxaparin (LOVENOX) injection  40 mg Subcutaneous Q24H   fluticasone  1 spray Each Nare Daily   hydrALAZINE  50 mg Oral Q8H   losartan  100 mg Oral Daily   nebivolol  2.5 mg Oral q morning   pantoprazole  40 mg Oral Daily   sodium chloride flush  3 mL Intravenous Q12H   Continuous Infusions:  sodium chloride     sodium chloride 1 mL/kg/hr (09/16/20 1100)   promethazine (PHENERGAN) injection (IM or IVPB) 6.25 mg (09/17/20 1057)   PRN Meds: sodium chloride, acetaminophen, hydrALAZINE, HYDROcodone-acetaminophen, morphine injection, nitroGLYCERIN, promethazine (PHENERGAN) injection (IM or IVPB), sodium chloride flush   Vital Signs    Vitals:   09/16/20 2055 09/17/20 0620 09/17/20 0742 09/17/20 0845  BP: (!) 153/82 (!) 138/51 (!) 123/51 (!) 162/57  Pulse: 66  68   Resp: 17  18   Temp: 98.8 F (37.1 C)  98.2 F (36.8 C)   TempSrc: Axillary  Oral   SpO2: 98%     Weight:      Height:        Intake/Output Summary (Last 24 hours) at 09/17/2020 1108 Last data filed at 09/17/2020 0912 Gross per 24 hour  Intake 253 ml  Output 1800 ml  Net -1547 ml   Last 3 Weights 09/16/2020 09/15/2020 09/08/2020  Weight (lbs) 199 lb 201 lb 201 lb  Weight (kg) 90.266 kg 91.173 kg 91.173 kg      Telemetry    SR, sinus brady. - Personally Reviewed  ECG    N/a - Personally Reviewed  Physical Exam   GEN: No acute distress.   Neck: No JVD Cardiac: RRR, 2/6 systolic murmur rusb, no jvd Respiratory: Clear to auscultation bilaterally. GI: Soft, nontender, non-distended  MS: No edema; No deformity. Neuro:  Nonfocal   Psych: Normal affect   Labs    High Sensitivity Troponin:   Recent Labs  Lab 09/08/20 1942 09/08/20 2251 09/15/20 1218 09/15/20 1418 09/16/20 0238  TROPONINIHS 15 11 16 13 12       Chemistry Recent Labs  Lab 09/15/20 1218 09/17/20 0307  NA 140 138  K 3.6 3.2*  CL 106 104  CO2 24 26  GLUCOSE 101* 108*  BUN 8 10  CREATININE 0.71 0.74  CALCIUM 8.6* 8.8*  GFRNONAA >60 >60  ANIONGAP 10 8     Hematology Recent Labs  Lab 09/15/20 1218  WBC 7.4  RBC 4.05  HGB 11.6*  HCT 35.7*  MCV 88.1  MCH 28.6  MCHC 32.5  RDW 12.9  PLT 258    BNPNo results for input(s): BNP, PROBNP in the last 168 hours.   DDimer No results for input(s): DDIMER in the last 168 hours.   Radiology    CT Head Wo Contrast  Result Date: 09/15/2020 CLINICAL DATA:  Headache EXAM: CT HEAD WITHOUT CONTRAST TECHNIQUE: Contiguous axial images were obtained from the base of the skull through the vertex without intravenous contrast. COMPARISON:  None. FINDINGS: Brain: Normal anatomic configuration. Parenchymal volume loss is commensurate with the patient's age. Mild periventricular white matter changes are present likely  reflecting the sequela of small vessel ischemia. No abnormal intra or extra-axial mass lesion or fluid collection. No abnormal mass effect or midline shift. No evidence of acute intracranial hemorrhage or infarct. Ventricular size is normal. Cerebellum unremarkable. Vascular: No asymmetric hyperdense vasculature at the skull base. Skull: Intact Sinuses/Orbits: Mucous retention cyst noted within the right maxillary sinus. Remaining paranasal sinuses are clear. Orbits are unremarkable. Other: Mastoid air cells and middle ear cavities are clear. IMPRESSION: No acute intracranial abnormality.  Mild senescent change. Electronically Signed   By: Helyn Numbers MD   On: 09/15/2020 15:53   CARDIAC CATHETERIZATION  Result Date: 09/16/2020  Mid LAD lesion is 50% stenosed.  3rd Diag lesion is 60%  stenosed.  Dist LAD-1 lesion is 90% stenosed.  Dist LAD-2 lesion is 40% stenosed.  1.  Significant one-vessel coronary artery disease involving the mid to distal LAD at the bifurcation of the third diagonal which has also 60% ostial stenosis.  The coronary arteries are very tortuous suggestive of hypertensive heart disease. 2.  Left ventricular angiography was not performed.  EF was normal by echo. 3.  Severely elevated blood pressure with mildly elevated left ventricular end-diastolic pressure. 4.  Transient left bundle Jacarie Pate block after engaging the left ventricle.  This resolved by the end of the case. 5.  Abdominal aortogram was performed and showed no evidence of renal artery stenosis. Recommendations: Unfortunately, the LAD stenosis is in a very tortuous segment and at the origin of the third diagonal Brynnlie Unterreiner which also has ostial disease.  PCI in this area will be high risk for dissection as well as sidebranch closure especially in the setting of uncontrolled hypertension.  In addition, the distal LAD after the stenosis is diffusely diseased. I recommend maximizing medical therapy and controlling blood pressure before considering PCI.  I added amlodipine to her antihypertensive medications. Other complicating issue is that the patient cannot tolerate aspirin long-term.  If we decide to proceed with PCI at some point, we will have to make sure she tolerates ticagrelor which can likely be used as monotherapy.   PERIPHERAL VASCULAR CATHETERIZATION  Result Date: 09/16/2020  Mid LAD lesion is 50% stenosed.  3rd Diag lesion is 60% stenosed.  Dist LAD-1 lesion is 90% stenosed.  Dist LAD-2 lesion is 40% stenosed.  1.  Significant one-vessel coronary artery disease involving the mid to distal LAD at the bifurcation of the third diagonal which has also 60% ostial stenosis.  The coronary arteries are very tortuous suggestive of hypertensive heart disease. 2.  Left ventricular angiography was not performed.  EF  was normal by echo. 3.  Severely elevated blood pressure with mildly elevated left ventricular end-diastolic pressure. 4.  Transient left bundle Cynara Tatham block after engaging the left ventricle.  This resolved by the end of the case. 5.  Abdominal aortogram was performed and showed no evidence of renal artery stenosis. Recommendations: Unfortunately, the LAD stenosis is in a very tortuous segment and at the origin of the third diagonal Tyffani Foglesong which also has ostial disease.  PCI in this area will be high risk for dissection as well as sidebranch closure especially in the setting of uncontrolled hypertension.  In addition, the distal LAD after the stenosis is diffusely diseased. I recommend maximizing medical therapy and controlling blood pressure before considering PCI.  I added amlodipine to her antihypertensive medications. Other complicating issue is that the patient cannot tolerate aspirin long-term.  If we decide to proceed with PCI at some point, we will have  to make sure she tolerates ticagrelor which can likely be used as monotherapy.   CT CORONARY MORPH W/CTA COR W/SCORE W/CA W/CM &/OR WO/CM  Addendum Date: 09/15/2020   ADDENDUM REPORT: 09/15/2020 18:26 CLINICAL DATA:  75 Year-old Caucasian Female EXAM: Cardiac/Coronary  CTA TECHNIQUE: The patient was scanned on a [employer]. FINDINGS: A 100 kV prospective scan was triggered in the descending thoracic aorta at 111 HU's. Axial non-contrast 3 mm slices were carried out through the heart. The data set was analyzed on a dedicated work station and scored using the Agatson method. Gantry rotation speed was 250 msecs and collimation was .6 mm. No beta blockade and 0.8 mg of sl NTG was given. The 3D data set was reconstructed in 5% intervals of the 67-82 % of the R-R cycle. Diastolic phases were analyzed on a dedicated work station using MPR, MIP and VRT modes. The patient received 100 cc of contrast. Aorta: Ascending aorta is mildly dilated for age  and body surface area: 40 mm. No calcifications in cardiac field of view. No dissection. Main Pulmonary Artery: Mild dilation of the pulmonary artery: 32 mm with artery to aorta ratio of < 0.9. Aortic Valve:  Tri-leaflet.  Mild annular calcification. Coronary Arteries:  Normal coronary origin.  Right dominance. Coronary Calcium Score: Left main: 0 Left anterior descending artery: 156 Left circumflex artery: 0 Right coronary artery: 1 Total: 157 Percentile: 74th for age, sex, and race matched control. RCA is a large dominant artery that gives rise to PDA and PLA. There is no plaque. Left main is a large artery that gives rise to LAD and LCX arteries. There is no plaque. LAD is a large vessel that gives rise to one small D1 Ej Pinson. There is a mild non obstructive stenosis (25-49%) calcific plaque in the proximal LAD. There is a moderate stenosis (50-70%) calcific plaque in the mid LAD. There is a moderate stenosis (50-70%) calcific plaque in the distal LAD. LCX is a non-dominant artery.  There is no plaque. There is a ramus intermedius vessel with minimal non-obstructive (1-24%) soft plaque in the mid vessel. Other findings: Normal pulmonary vein drainage into the left atrium. Normal left atrial appendage without a thrombus. Cannot exclude small patent foramen ovale. Extra-cardiac findings: See attached radiology report for non-cardiac structures. IMPRESSION: 1. Coronary calcium score of 157. This was 74th percentile for age, sex, and race matched control. 2. Normal coronary origin with right dominance. 3. CAD-RADS 3. Moderate stenosis. Consider symptom-guided anti-ischemic pharmacotherapy as well as risk factor modification per guideline directed care. Additional analysis with CT FFR will be submitted. 4. Ascending aorta is mildly dilation for age and body surface area: 40 mm. 5. Mild dilation of the pulmonary artery. 6. Cannot exclude small patent foramen ovale. Reaching out to primary team. RECOMMENDATIONS:  Coronary artery calcium (CAC) score is a strong predictor of incident coronary heart disease (CHD) and provides predictive information beyond traditional risk factors. CAC scoring is reasonable to use in the decision to withhold, postpone, or initiate statin therapy in intermediate-risk or selected borderline-risk asymptomatic adults (age 26-75 years and LDL-C >=70 to <190 mg/dL) who do not have diabetes or established atherosclerotic cardiovascular disease (ASCVD).* In intermediate-risk (10-year ASCVD risk >=7.5% to <20%) adults or selected borderline-risk (10-year ASCVD risk >=5% to <7.5%) adults in whom a CAC score is measured for the purpose of making a treatment decision the following recommendations have been made: If CAC = 0, it is reasonable to withhold statin therapy and reassess  in 5 to 10 years, as long as higher risk conditions are absent (diabetes mellitus, family history of premature CHD in first degree relatives (males <55 years; females <65 years), cigarette smoking, LDL >=190 mg/dL or other independent risk factors). If CAC is 1 to 99, it is reasonable to initiate statin therapy for patients ?73 years of age. If CAC is >=100 or >=75th percentile, it is reasonable to initiate statin therapy at any age. Cardiology referral should be considered for patients with CAC scores =400 or >=75th percentile. *2018 AHA/ACC/AACVPR/AAPA/ABC/ACPM/ADA/AGS/APhA/ASPC/NLA/PCNA Guideline on the Management of Blood Cholesterol: A Report of the American College of Cardiology/American Heart Association Task Force on Clinical Practice Guidelines. J Am Coll Cardiol. 2019;73(24):3168-3209. Riley LamMahesh Chandrasekhar, MD Electronically Signed   By: Riley LamMahesh   Chandrasekhar MD   On: 09/15/2020 18:26   Result Date: 09/15/2020 EXAM: OVER-READ INTERPRETATION  CT CHEST The following report is an over-read performed by radiologist Dr. Jeronimo [facility]avesKyle Talbot [facility] on 09/15/2020. This over-read does not include interpretation of  cardiac or coronary anatomy or pathology. The coronary CTA interpretation by the cardiologist is attached. COMPARISON:  Chest radiograph of earlier today. FINDINGS: Vascular: Upper normal ascending aortic caliber, including 3.9 cm on 09/10. Aortic atherosclerosis. No central pulmonary embolism, on this non-dedicated study. Mediastinum/Nodes: No imaged thoracic adenopathy. Tiny hiatal hernia. Lungs/Pleura: No pleural fluid.  Clear imaged lungs. Upper Abdomen: Moderate hepatic steatosis. Normal imaged portions of the spleen. Musculoskeletal: Mild-to-moderate convex right lower thoracic spinal curvature. IMPRESSION: 1. No acute findings in the imaged extracardiac chest. 2. Hepatic steatosis. 3. Tiny hiatal hernia. 4. Aortic Atherosclerosis (ICD10-I70.0). Electronically Signed: By: Jeronimo GreavesKyle  Talbot M.D. On: 09/15/2020 15:51   DG Chest Port 1 View  Result Date: 09/15/2020 CLINICAL DATA:  Chest pain EXAM: PORTABLE CHEST 1 VIEW COMPARISON:  09/08/2020 FINDINGS: Cardiac enlargement without heart failure. Lungs clear without infiltrate or effusion Thoracic scoliosis.  Azygos lobe fissure noted. IMPRESSION: No active disease. Electronically Signed   By: Marlan Palauharles  Clark M.D.   On: 09/15/2020 12:38   CT CORONARY FRACTIONAL FLOW RESERVE FLUID ANALYSIS  Result Date: 09/16/2020 EXAM: CT-FFR ANALYSIS CLINICAL DATA:  Possibly obstructive coronary lesion in LAD FINDINGS: CT-FFR analysis was performed on the original cardiac CT angiogram dataset. Diagrammatic representation of the CT-FFR analysis is provided in a separate PDF document in PACS. This dictation was created using the PDF document and an interactive 3D model of the results. 3D model is not available in the EMR/PACS. Normal FFR range is >0.80. 1. Left Main: No significant functional stenosis, CT-FFR 0.99. 2. LAD: Significant functional stenosis in mid-distal LAD, CT-FFR 0.67. 3. LCX: No significant functional stenosis, CT-FFR 0.92. 4. RCA: No significant functional  stenosis, CT-FFR 0.96. 5. Ramus: No significant functional stenosis, CT-FFR 0.89. : 1. CT FFR analysis shows evidence of significant functional stenosis in the LAD. Riley LamMahesh Chandrasekhar MD Electronically Signed   By: Riley LamMahesh   Chandrasekhar MD   On: 09/16/2020 11:12    Cardiac Studies    Patient Profile     75 y.o. female  with a hx of hypertension, PVC, HLD, chronic pain syndrome with lumbar stenosis was admitted after she went to the office w/ CP and SBP 260 >> sent to ER >> cardiac CT w/ LAD stenosis >> admitted for cath.  Assessment & Plan    1.Unstable angina/Chest pain - coronary CTA with significant LAD disease, referred for cath - cath: LM patent, mid LAD 50%, distal LAD 90%, D3 60%. From cath note LAD lesion in very  tortuous segment at branching point of D3, high risk for dissection vs side Miral Hoopes closure, and the distal LAD is diffusely disease limiting benefit.Recs for medical therapy, consider PCI if neccesary  - limited tolerance of ASA -echo: LVEF 60-65%, grade I dd,   - medical therapy with bystolic 2.5, norvasc 5, losartan 100, atorva 40. Limited tolerance to aspirin due to GI side effects/nausea, in setting of obstructive CAD start plavix 75mg  daily as alternative - beta blocker dosing limited due to bradycardia - headache on imdur, stopped. Still some chest pain at times. Increase norvasc to 10mg  for additional antianginal effects.    2.HTN - in ER reported SBP 260 - beta blocker dosing decreased due to bradycardia - during cath aortogram performed without evidence of renal artery stenosis - bp's up and down, follow trends with recent changes - check renin/aldo. May need outpatient sleep eval. Increase norvac to 10mg  daily.   3. Mild aortic stenosis - echo mean grad 11, AVA VTI 2.5   4. 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   For questions or updates, please contact [facility] Please consult [email or web address] for  contact info under        Signed, , MD  09/17/2020, 11:08 AM

## 2020-09-17 NOTE — Progress Notes (Signed)
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 Izora Ribashandrasekhar with cardiology for evaluation of chest discomfort and hypertensive urgency. She underwent CT angiogram of the coronary arteries per cardiology recommendations.  This revealed moderate stenosis with CT FFR pending. She underwent cardiac catheterization and cardiology recommended medical management.   Pt seen and examined at bedside. She reports headache from Imdur. Discontinued the imdur today.     Assessment & Plan:   Principal Problem:   Hypertensive urgency Active Problems:   GERD (gastroesophageal reflux disease)   Chest pain   Interstitial cystitis (chronic) without hematuria   Chronic low back pain   Chronic diastolic CHF (congestive heart failure) (HCC)   Unstable angina (HCC)   Hypertensive crisis   Hypertensive crisis Blood pressure parameters are improving. She is currently on amlodipine 5 mg daily increased the dose to 10 mg daily, hydralazine 50 mg TID, Losartan 100 mg daily abystolic 2.5 mg dialy ( decreased the dose due to bradycardia)   Chronic diastolic heart failure Patient appears to be compensated at this time. Echo showed left ventricular ejection fraction of 60 to 65% without any regional wall abnormalities, moderate left ventricular hypertrophy with grade 1 diastolic dysfunction.  Chest pain with both typical and atypical features Cardiology consulted and she underwent cardiac catheterization showing single vessel  disease, unable to place a stent due to tortuosity of the vessel and extensive disease distal to the stenosis. Cardiology recommends maximal medical management. Continue with the Lipitor, optimal blood pressure control.   Chronic interstitial cystitis without hematuria Patient follows up with urology as outpatient and she was recently put on course of Augmentin to complete. Patient also intermittently does self catheterizations.  Recommend getting urine cultures - negative.    Chronic low back pain Continue with pain medications at this time.   GERD Stable.   DVT prophylaxis: (Lovenox) Code Status: (Full CODE) Family Communication: (none at bedside) Disposition:   Status is: Observation  The patient will require care spanning > 2 midnights and should be moved to inpatient because: Ongoing diagnostic testing needed not appropriate for outpatient work up and Inpatient level of care appropriate due to severity of illness  Dispo: The patient is from: Home              Anticipated d/c is to:  pending              Patient currently is not medically stable to d/c.   Difficult to place patient No       Consultants:  cardiology  Procedures: Cardiac catheterization  Antimicrobials:  Antibiotics Given (last 72 hours)     Date/Time Action Medication Dose   09/15/20 2258 Given   amoxicillin-clavulanate (AUGMENTIN) 500-125 MG per tablet 500 mg 500 mg   09/16/20 1145 Given   amoxicillin-clavulanate (AUGMENTIN) 500-125 MG per tablet 500 mg 500 mg   09/16/20 2054 Given   amoxicillin-clavulanate (AUGMENTIN) 875-125 MG per tablet 1 tablet 1 tablet   09/17/20 0845 Given   amoxicillin-clavulanate (AUGMENTIN) 875-125 MG per tablet 1 tablet 1 tablet  Subjective: Severe headache, improving, stopped the imdur.  Objective: Vitals:   09/17/20 0742 09/17/20 0845 09/17/20 1203 09/17/20 1534  BP: (!) 123/51 (!) 162/57 (!) 180/74 (!) 169/67  Pulse: 68  65 77  Resp: 18  18 18    Temp: 98.2 F (36.8 C)  98.3 F (36.8 C)   TempSrc: Oral  Oral   SpO2:   98%   Weight:      Height:        Intake/Output Summary (Last 24 hours) at 09/17/2020 1720 Last data filed at 09/17/2020 1500 Gross per 24 hour  Intake 543 ml  Output 900 ml  Net -357 ml    Filed Weights   09/15/20 1226 09/16/20 0142  Weight: 91.2 kg 90.3 kg    Examination:  General exam: alert and comfortable.  Respiratory system: Air entry fair, no wheezing heard.  Cardiovascular system: S1 & S2 heard, RRR, no JVD, n pedal edema. Gastrointestinal system: Abdomen is soft,NT ND BS+ Central nervous system: alert and oriented non focal.  Extremities: No pedal edema.  Skin: No rashes, lesions or ulcers Psychiatry:  Mood is appropriate.     Data Reviewed: I have personally reviewed following labs and imaging studies  CBC: Recent Labs  Lab 09/15/20 1218  WBC 7.4  HGB 11.6*  HCT 35.7*  MCV 88.1  PLT 258     Basic Metabolic Panel: Recent Labs  Lab 09/15/20 1218 09/17/20 0307  NA 140 138  K 3.6 3.2*  CL 106 104  CO2 24 26  GLUCOSE 101* 108*  BUN 8 10  CREATININE 0.71 0.74  CALCIUM 8.6* 8.8*     GFR: Estimated Creatinine Clearance: 70.5 mL/min (by C-G formula based on SCr of 0.74 mg/dL).  Liver Function Tests: No results for input(s): AST, ALT, ALKPHOS, BILITOT, PROT, ALBUMIN in the last 168 hours.  CBG: No results for input(s): GLUCAP in the last 168 hours.   Recent Results (from the past 240 hour(s))  SARS CORONAVIRUS 2 (TAT 6-24 HRS) Nasopharyngeal Nasopharyngeal Swab     Status: None   Collection Time: 09/15/20  9:01 PM   Specimen: Nasopharyngeal Swab  Result Value Ref Range Status   SARS Coronavirus 2 NEGATIVE NEGATIVE Final    Comment: (NOTE) SARS-CoV-2 target nucleic acids are NOT DETECTED.  The SARS-CoV-2 RNA is generally detectable in upper and lower respiratory specimens during the acute phase of infection. Negative results do not preclude SARS-CoV-2  infection, do not rule out co-infections with other pathogens, and should not be used as the sole basis for treatment or other patient management decisions. Negative results must be combined with clinical observations, patient history, and epidemiological information. The expected result is Negative.  Fact Sheet for Patients: 09/17/20  Fact Sheet for Healthcare Providers: HairSlick.no  This test is not yet approved or cleared by the quierodirigir.com FDA and  has been authorized for detection and/or diagnosis of SARS-CoV-2 by FDA under an Emergency Use Authorization (EUA). This EUA will remain  in effect (meaning this test can be used) for the duration of the COVID-19 declaration under Se ction 564(b)(1) of the Act, 21 U.S.C. section 360bbb-3(b)(1), unless the authorization is terminated or revoked sooner.  Performed at [facility], [address], [place], [place]   Culture, Urine     Status: None   Collection Time: 09/16/20  1:27 PM   Specimen: Urine, Clean Catch  Result Value Ref Range Status   Specimen Description URINE, CLEAN CATCH  Final  Special Requests NONE  Final   Culture   Final    NO GROWTH Performed at [facility], [address] [place], [place] [address]    Report Status 09/17/2020 FINAL  Final          Radiology Studies: CARDIAC CATHETERIZATION  Result Date: 09/16/2020  Mid LAD lesion is 50% stenosed.  3rd Diag lesion is 60% stenosed.  Dist LAD-1 lesion is 90% stenosed.  Dist LAD-2 lesion is 40% stenosed.  1.  Significant one-vessel coronary artery disease involving the mid to distal LAD at the bifurcation of the third diagonal which has also 60% ostial stenosis.  The coronary arteries are very tortuous suggestive of hypertensive heart disease. 2.  Left ventricular angiography was not performed.  EF was normal by echo. 3.  Severely elevated blood pressure with mildly  elevated left ventricular end-diastolic pressure. 4.  Transient left bundle branch block after engaging the left ventricle.  This resolved by the end of the case. 5.  Abdominal aortogram was performed and showed no evidence of renal artery stenosis. Recommendations: Unfortunately, the LAD stenosis is in a very tortuous segment and at the origin of the third diagonal branch which also has ostial disease.  PCI in this area will be high risk for dissection as well as sidebranch closure especially in the setting of uncontrolled hypertension.  In addition, the distal LAD after the stenosis is diffusely diseased. I recommend maximizing medical therapy and controlling blood pressure before considering PCI.  I added amlodipine to her antihypertensive medications. Other complicating issue is that the patient cannot tolerate aspirin long-term.  If we decide to proceed with PCI at some point, we will have to make sure she tolerates ticagrelor which can likely be used as monotherapy.   PERIPHERAL VASCULAR CATHETERIZATION  Result Date: 09/16/2020  Mid LAD lesion is 50% stenosed.  3rd Diag lesion is 60% stenosed.  Dist LAD-1 lesion is 90% stenosed.  Dist LAD-2 lesion is 40% stenosed.  1.  Significant one-vessel coronary artery disease involving the mid to distal LAD at the bifurcation of the third diagonal which has also 60% ostial stenosis.  The coronary arteries are very tortuous suggestive of hypertensive heart disease. 2.  Left ventricular angiography was not performed.  EF was normal by echo. 3.  Severely elevated blood pressure with mildly elevated left ventricular end-diastolic pressure. 4.  Transient left bundle branch block after engaging the left ventricle.  This resolved by the end of the case. 5.  Abdominal aortogram was performed and showed no evidence of renal artery stenosis. Recommendations: Unfortunately, the LAD stenosis is in a very tortuous segment and at the origin of the third diagonal branch which  also has ostial disease.  PCI in this area will be high risk for dissection as well as sidebranch closure especially in the setting of uncontrolled hypertension.  In addition, the distal LAD after the stenosis is diffusely diseased. I recommend maximizing medical therapy and controlling blood pressure before considering PCI.  I added amlodipine to her antihypertensive medications. Other complicating issue is that the patient cannot tolerate aspirin long-term.  If we decide to proceed with PCI at some point, we will have to make sure she tolerates ticagrelor which can likely be used as monotherapy.   CT CORONARY FRACTIONAL FLOW RESERVE FLUID ANALYSIS  Result Date: 09/16/2020 EXAM: CT-FFR ANALYSIS CLINICAL DATA:  Possibly obstructive coronary lesion in LAD FINDINGS: CT-FFR analysis was performed on the original cardiac CT angiogram dataset. Diagrammatic representation of  the CT-FFR analysis is provided in a separate PDF document in PACS. This dictation was created using the PDF document and an interactive 3D model of the results. 3D model is not available in the EMR/PACS. Normal FFR range is >0.80. 1. Left Main: No significant functional stenosis, CT-FFR 0.99. 2. LAD: Significant functional stenosis in mid-distal LAD, CT-FFR 0.67. 3. LCX: No significant functional stenosis, CT-FFR 0.92. 4. RCA: No significant functional stenosis, CT-FFR 0.96. 5. Ramus: No significant functional stenosis, CT-FFR 0.89. : 1. CT FFR analysis shows evidence of significant functional stenosis in the LAD. Riley Lam MD Electronically Signed   By: Riley Lam MD   On: 09/16/2020 11:12        Scheduled Meds:  Melene Muller ON 09/18/2020] amLODipine  10 mg Oral Daily   amoxicillin-clavulanate  1 tablet Oral Q12H   atorvastatin  40 mg Oral Daily   clopidogrel  75 mg Oral Daily   enoxaparin (LOVENOX) injection  40 mg Subcutaneous Q24H   fluticasone  1 spray Each Nare Daily   hydrALAZINE  50 mg Oral Q8H   losartan   100 mg Oral Daily   nebivolol  2.5 mg Oral q morning   pantoprazole  40 mg Oral Daily   sodium chloride flush  3 mL Intravenous Q12H   Continuous Infusions:  sodium chloride     sodium chloride 1 mL/kg/hr (09/16/20 1100)   promethazine (PHENERGAN) injection (IM or IVPB) 6.25 mg (09/17/20 1057)     LOS: 1 day       Kathlen Mody, MD [facility]   To contact the attending provider between 7A-7P or the covering provider during after hours 7P-7A, please log into the web site [email or web address] and access using universal [place] password for that web site. If you do not have the password, please call the hospital operator.  09/17/2020, 5:20 PM

## 2020-09-18 DIAGNOSIS — R079 Chest pain, unspecified: Secondary | ICD-10-CM | POA: Diagnosis not present

## 2020-09-18 DIAGNOSIS — I160 Hypertensive urgency: Secondary | ICD-10-CM | POA: Diagnosis not present

## 2020-09-18 LAB — BASIC METABOLIC PANEL WITH GFR
Anion gap: 9 (ref 5–15)
BUN: 9 mg/dL (ref 8–23)
CO2: 22 mmol/L (ref 22–32)
Calcium: 8.4 mg/dL — ABNORMAL LOW (ref 8.9–10.3)
Chloride: 105 mmol/L (ref 98–111)
Creatinine, Ser: 0.75 mg/dL (ref 0.44–1.00)
GFR, Estimated: >60 mL/min (ref >60)
Glucose, Bld: 124 mg/dL — ABNORMAL HIGH (ref 70–99)
Potassium: 3.4 mmol/L — ABNORMAL LOW (ref 3.5–5.1)
Sodium: 136 mmol/L (ref 135–145)

## 2020-09-18 MED ORDER — POTASSIUM CHLORIDE CRYS ER 20 MEQ PO TBCR
40.0000 meq | EXTENDED_RELEASE_TABLET | Freq: Once | ORAL | Status: AC
  Administered 2020-09-18: 40 meq via ORAL
  Filled 2020-09-18: qty 2

## 2020-09-18 MED ORDER — SUCRALFATE 1 GM/10ML PO SUSP
1.0000 g | Freq: Two times a day (BID) | ORAL | Status: DC
  Administered 2020-09-18 – 2020-09-27 (×19): 1 g via ORAL
  Filled 2020-09-18 (×19): qty 10

## 2020-09-18 MED ORDER — HYDRALAZINE HCL 50 MG PO TABS
75.0000 mg | ORAL_TABLET | Freq: Three times a day (TID) | ORAL | Status: DC
  Administered 2020-09-18 (×2): 75 mg via ORAL
  Filled 2020-09-18 (×2): qty 1

## 2020-09-18 NOTE — Progress Notes (Signed)
Progress Note  Patient Name: Jennifer [name] Date of Encounter: 09/18/2020  [facility] Cardiologist: Christell Constant, MD   Subjective   Some chest burning with laying flat last night, no chest pressure or tightness  Inpatient Medications    Scheduled Meds:  amLODipine  10 mg Oral Daily   amoxicillin-clavulanate  1 tablet Oral Q12H   atorvastatin  40 mg Oral Daily   clopidogrel  75 mg Oral Daily   enoxaparin (LOVENOX) injection  40 mg Subcutaneous Q24H   fluticasone  1 spray Each Nare Daily   hydrALAZINE  50 mg Oral Q8H   losartan  100 mg Oral Daily   nebivolol  2.5 mg Oral q morning   pantoprazole  40 mg Oral Daily   potassium chloride  40 mEq Oral Once   sodium chloride flush  3 mL Intravenous Q12H   sucralfate  1 g Oral BID   Continuous Infusions:  sodium chloride     sodium chloride 1 mL/kg/hr (09/16/20 1100)   promethazine (PHENERGAN) injection (IM or IVPB) 6.25 mg (09/17/20 2323)   PRN Meds: sodium chloride, acetaminophen, hydrALAZINE, HYDROcodone-acetaminophen, morphine injection, nitroGLYCERIN, promethazine (PHENERGAN) injection (IM or IVPB), sodium chloride flush   Vital Signs    Vitals:   09/17/20 1534 09/17/20 2028 09/17/20 2122 09/18/20 0606  BP: (!) 169/67 (!) 158/66 (!) 158/66 (!) 191/92  Pulse: 77 65  67  Resp: 18 19  16   Temp:  97.9 F (36.6 C)  97.9 F (36.6 C)  TempSrc:  Oral  Oral  SpO2:  98%  99%  Weight:      Height:        Intake/Output Summary (Last 24 hours) at 09/18/2020 1022 Last data filed at 09/17/2020 1800 Gross per 24 hour  Intake 410 ml  Output --  Net 410 ml   Last 3 Weights 09/16/2020 09/15/2020 09/08/2020  Weight (lbs) 199 lb 201 lb 201 lb  Weight (kg) 90.266 kg 91.173 kg 91.173 kg      Telemetry    SR - Personally Reviewed  ECG    N/a - Personally Reviewed  Physical Exam   GEN: No acute distress.   Neck: No JVD Cardiac: RRR, 2/6 systolic murmur rusb no, rubs, or gallops.  Respiratory: Clear to  auscultation bilaterally. GI: Soft, nontender, non-distended  MS: No edema; No deformity. Neuro:  Nonfocal  Psych: Normal affect   Labs    High Sensitivity Troponin:   Recent Labs  Lab 09/08/20 1942 09/08/20 2251 09/15/20 1218 09/15/20 1418 09/16/20 0238  TROPONINIHS 15 11 16 13 12       Chemistry Recent Labs  Lab 09/15/20 1218 09/17/20 0307  NA 140 138  K 3.6 3.2*  CL 106 104  CO2 24 26  GLUCOSE 101* 108*  BUN 8 10  CREATININE 0.71 0.74  CALCIUM 8.6* 8.8*  GFRNONAA >60 >60  ANIONGAP 10 8     Hematology Recent Labs  Lab 09/15/20 1218  WBC 7.4  RBC 4.05  HGB 11.6*  HCT 35.7*  MCV 88.1  MCH 28.6  MCHC 32.5  RDW 12.9  PLT 258    BNPNo results for input(s): BNP, PROBNP in the last 168 hours.   DDimer No results for input(s): DDIMER in the last 168 hours.   Radiology    No results found.  Cardiac Studies     Patient Profile     75 y.o. female  with a hx of hypertension, PVC, HLD, chronic pain syndrome with lumbar  stenosis was admitted after she went to the office w/ CP and SBP 260 >> sent to ER >> cardiac CT w/ LAD stenosis >> admitted for cath.  Assessment & Plan    1.Unstable angina/Chest pain - coronary CTA with significant LAD disease, referred for cath - cath: LM patent, mid LAD 50%, distal LAD 90%, D3 60%. From cath note LAD lesion in very tortuous segment at branching point of D3, high risk for dissection vs side Maile Linford closure, and the distal LAD is diffusely disease limiting benefit.Recs for medical therapy, consider PCI if neccesary - limited tolerance of ASA -echo: LVEF 60-65%, grade I dd,   - medical therapy with bystolic 2.5, norvasc 10, losartan 100, atorva 40. Limited tolerance to aspirin due to GI side effects/nausea, in setting of obstructive CAD started plavix 75mg  daily as alternative - beta blocker dosing limited due to bradycardia - headache on imdur, stopped. Yesterday we increased norvasc to 10mg  daily - could consider  ranexa given antianginal limitations.      2.HTN - in ER reported SBP 260 - beta blocker dosing decreased due to bradycardia - during cath aortogram performed without evidence of renal artery stenosis - aldo renin pending, may need outpatient sleep study.  - bp's remain elevated, she is on norvasc 10, bystolic 2.5, hydral 50mg  tid, losartan 100. Increase hydral to 75mg  tid. Listed allergy to thiazide diuretic. Room to titrate hydral further, could consider aldactone.    3. Mild aortic stenosis - echo mean grad 11, AVA VTI 2.5   4. 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   For questions or updates, please contact [facility] Please consult [email or web address] for contact info under        Signed, , MD  09/18/2020, 10:22 AM

## 2020-09-18 NOTE — Progress Notes (Signed)
Verbal order from Dr Blake Divine that patient does not require IV access.

## 2020-09-18 NOTE — Progress Notes (Signed)
PROGRESS NOTE    Jennifer [name]  ERD:[id number] DOB: 07/24/1945 DOA: 09/15/2020 PCP: [facility]    Chief Complaint  Patient presents with   Chest Pain    hypertensive    Brief Narrative:  75 year old female with past medical history of hypertension, hyperlipidemia, gastroesophageal reflux disease, interstitial cystitis requiring intermittent self-catheterization, chronic pain syndrome secondary to lumbar stenosis who presents to [facility] emergency department at the instruction of Dr. [name] with cardiology for evaluation of chest discomfort and hypertensive urgency. She underwent CT angiogram of the coronary arteries per cardiology recommendations.  This revealed moderate stenosis with CT FFR pending. She underwent cardiac catheterization and cardiology recommended medical management. Pt had severe headache after Imdur and it was discontinued. She reports some chest discomfort last night and this morning, unclear if its GERD or chest pain.     Assessment & Plan:   Principal Problem:   Hypertensive urgency Active Problems:   GERD (gastroesophageal reflux disease)   Chest pain   Interstitial cystitis (chronic) without hematuria   Chronic low back pain   Chronic diastolic CHF (congestive heart failure) (HCC)   Unstable angina (HCC)   Hypertensive crisis   Hypertensive crisis Blood pressure parameters are improving. She is currently on amlodipine 5 mg daily increased the dose to 10 mg daily, hydralazine 50 mg TID, Losartan 100 mg daily bystolic 2.5 mg dialy ( decreased the dose due to bradycardia).  Can increase the dose of hydralazine to 100 mg TID if her BP does not improve.   Chronic diastolic heart failure Patient appears to be compensated at this time. Echo showed left ventricular ejection fraction of 60 to 65% without any regional wall abnormalities, moderate left ventricular hypertrophy with grade 1 diastolic dysfunction. Pt  denies any sob or pedal edema.    Chest pain with both typical and atypical features Cardiology consulted and she underwent cardiac catheterization showing single vessel disease, unable to place a stent due to tortuosity of the vessel and extensive disease distal to the stenosis. Cardiology recommends maximal medical management. Currently on plavix 75 mg daily, lipitor 40 mg daily, and bystolic.  Continue with the Lipitor, optimal blood pressure control.   Chronic interstitial cystitis without hematuria Patient follows up with urology as outpatient and she was recently put on course of Augmentin to complete. Patient also intermittently does self catheterizations.  Recommend getting urine cultures -  which have been negative.    Chronic low back pain Continue with pain medications at this time.   GERD/ sub sternal burning sensation at rest : Probably worsening gERD, she reports taking sucralfate BID along with prilosec BID, unfortunately we do not have omeprazole in the hospital, and is replaced by protonix, which the patient reports is not effective.    Hypokalemia:  Replaced.     DVT prophylaxis: (Lovenox) Code Status: (Full CODE) Family Communication: (none at bedside) Disposition:   Status is: Inpatient.   The patient will require care spanning > 2 midnights and should be moved to inpatient because: Ongoing diagnostic testing needed not appropriate for outpatient work up and Inpatient level of care appropriate due to severity of illness  Dispo: The patient is from: Home              Anticipated d/c is to:  pending              Patient currently is not medically stable to d/c.   Difficult to place patient No  Consultants:  cardiology  Procedures: Cardiac catheterization  Antimicrobials:  Antibiotics Given (last 72 hours)     Date/Time Action Medication Dose   09/15/20 2258 Given   amoxicillin-clavulanate (AUGMENTIN) 500-125 MG per tablet 500 mg 500 mg    09/16/20 1145 Given   amoxicillin-clavulanate (AUGMENTIN) 500-125 MG per tablet 500 mg 500 mg   09/16/20 2054 Given   amoxicillin-clavulanate (AUGMENTIN) 875-125 MG per tablet 1 tablet 1 tablet   09/17/20 0845 Given   amoxicillin-clavulanate (AUGMENTIN) 875-125 MG per tablet 1 tablet 1 tablet   09/17/20 2121 Given   amoxicillin-clavulanate (AUGMENTIN) 875-125 MG per tablet 1 tablet 1 tablet   09/18/20 1106 Given   amoxicillin-clavulanate (AUGMENTIN) 875-125 MG per tablet 1 tablet 1 tablet        Subjective: Headache resolved, now has some sub sternal discomfort intermittently.  Objective: Vitals:   09/17/20 2028 09/17/20 2122 09/18/20 0606 09/18/20 1108  BP: (!) 158/66 (!) 158/66 (!) 191/92 139/62  Pulse: 65  67   Resp: 19  16   Temp: 97.9 F (36.6 C)  97.9 F (36.6 C)   TempSrc: Oral  Oral   SpO2: 98%  99%   Weight:      Height:        Intake/Output Summary (Last 24 hours) at 09/18/2020 1115 Last data filed at 09/17/2020 1800 Gross per 24 hour  Intake 410 ml  Output --  Net 410 ml    Filed Weights   09/15/20 1226 09/16/20 0142  Weight: 91.2 kg 90.3 kg    Examination:  General exam: well developed lady, not in distress.  Respiratory system: Clear to auscultation, no wheezing or rhonchi on RA.  Cardiovascular system: S1 & S2 heard, RRR no JVD, no pedal edema.  Gastrointestinal system: Abdomen is soft, non tender non distended, bowel sounds wnl.  Central nervous system: alert and oriented, non focal, Extremities: No pedal edema.  Skin: No rashes seen.  Psychiatry:  Mood is appropriate.     Data Reviewed: I have personally reviewed following labs and imaging studies  CBC: Recent Labs  Lab 09/15/20 1218  WBC 7.4  HGB 11.6*  HCT 35.7*  MCV 88.1  PLT 258     Basic Metabolic Panel: Recent Labs  Lab 09/15/20 1218 09/17/20 0307  NA 140 138  K 3.6 3.2*  CL 106 104  CO2 24 26  GLUCOSE 101* 108*  BUN 8 10  CREATININE 0.71 0.74  CALCIUM 8.6* 8.8*      GFR: Estimated Creatinine Clearance: 70.5 mL/min (by C-G formula based on SCr of 0.74 mg/dL).  Liver Function Tests: No results for input(s): AST, ALT, ALKPHOS, BILITOT, PROT, ALBUMIN in the last 168 hours.  CBG: No results for input(s): GLUCAP in the last 168 hours.   Recent Results (from the past 240 hour(s))  SARS CORONAVIRUS 2 (TAT 6-24 HRS) Nasopharyngeal Nasopharyngeal Swab     Status: None   Collection Time: 09/15/20  9:01 PM   Specimen: Nasopharyngeal Swab  Result Value Ref Range Status   SARS Coronavirus 2 NEGATIVE NEGATIVE Final    Comment: (NOTE) SARS-CoV-2 target nucleic acids are NOT DETECTED.  The SARS-CoV-2 RNA is generally detectable in upper and lower respiratory specimens during the acute phase of infection. Negative results do not preclude SARS-CoV-2 infection, do not rule out co-infections with other pathogens, and should not be used as the sole basis for treatment or other patient management decisions. Negative results must be combined with clinical observations, patient history, and epidemiological information.  The expected result is Negative.  Fact Sheet for Patients: HairSlick.no  Fact Sheet for Healthcare Providers: quierodirigir.com  This test is not yet approved or cleared by the [place] FDA and  has been authorized for detection and/or diagnosis of SARS-CoV-2 by FDA under an Emergency Use Authorization (EUA). This EUA will remain  in effect (meaning this test can be used) for the duration of the COVID-19 declaration under Se ction 564(b)(1) of the Act, 21 U.S.C. section 360bbb-3(b)(1), unless the authorization is terminated or revoked sooner.  Performed at [facility], [address], [place], [place] [address]   Culture, Urine     Status: None   Collection Time: 09/16/20  1:27 PM   Specimen: Urine, Clean Catch  Result Value Ref Range Status   Specimen Description  URINE, CLEAN CATCH  Final   Special Requests NONE  Final   Culture   Final    NO GROWTH Performed at [facility], [address] [place], [place] [address]    Report Status 09/17/2020 FINAL  Final          Radiology Studies: No results found.      Scheduled Meds:  amLODipine  10 mg Oral Daily   amoxicillin-clavulanate  1 tablet Oral Q12H   atorvastatin  40 mg Oral Daily   clopidogrel  75 mg Oral Daily   enoxaparin (LOVENOX) injection  40 mg Subcutaneous Q24H   fluticasone  1 spray Each Nare Daily   hydrALAZINE  75 mg Oral Q8H   losartan  100 mg Oral Daily   nebivolol  2.5 mg Oral q morning   pantoprazole  40 mg Oral Daily   sodium chloride flush  3 mL Intravenous Q12H   sucralfate  1 g Oral BID   Continuous Infusions:  sodium chloride     sodium chloride 1 mL/kg/hr (09/16/20 1100)   promethazine (PHENERGAN) injection (IM or IVPB) 6.25 mg (09/17/20 2323)     LOS: 2 days       Kathlen Mody, MD [facility]   To contact the attending provider between 7A-7P or the covering provider during after hours 7P-7A, please log into the web site [email or web address] and access using universal [place] password for that web site. If you do not have the password, please call the hospital operator.  09/18/2020, 11:15 AM

## 2020-09-19 ENCOUNTER — Other Ambulatory Visit: Payer: Self-pay

## 2020-09-19 DIAGNOSIS — I169 Hypertensive crisis, unspecified: Secondary | ICD-10-CM

## 2020-09-19 DIAGNOSIS — I160 Hypertensive urgency: Secondary | ICD-10-CM | POA: Diagnosis not present

## 2020-09-19 LAB — BASIC METABOLIC PANEL
Anion gap: 8 (ref 5–15)
BUN: 10 mg/dL (ref 8–23)
CO2: 22 mmol/L (ref 22–32)
Calcium: 8.9 mg/dL (ref 8.9–10.3)
Chloride: 108 mmol/L (ref 98–111)
Creatinine, Ser: 0.77 mg/dL (ref 0.44–1.00)
GFR, Estimated: >60 mL/min (ref >60)
Glucose, Bld: 109 mg/dL — ABNORMAL HIGH (ref 70–99)
Potassium: 3.7 mmol/L (ref 3.5–5.1)
Sodium: 138 mmol/L (ref 135–145)

## 2020-09-19 LAB — CBC WITH DIFFERENTIAL/PLATELET
Abs Immature Granulocytes: 0.01 10*3/uL (ref 0.00–0.07)
Basophils Absolute: 0 10*3/uL (ref 0.0–0.1)
Basophils Relative: 0 %
Eosinophils Absolute: 0.3 10*3/uL (ref 0.0–0.5)
Eosinophils Relative: 4 %
HCT: 36.7 % (ref 36.0–46.0)
Hemoglobin: 11.8 g/dL — ABNORMAL LOW (ref 12.0–15.0)
Immature Granulocytes: 0 %
Lymphocytes Relative: 34 %
Lymphs Abs: 2.8 10*3/uL (ref 0.7–4.0)
MCH: 29 pg (ref 26.0–34.0)
MCHC: 32.2 g/dL (ref 30.0–36.0)
MCV: 90.2 fL (ref 80.0–100.0)
Monocytes Absolute: 0.8 10*3/uL (ref 0.1–1.0)
Monocytes Relative: 10 %
Neutro Abs: 4.2 10*3/uL (ref 1.7–7.7)
Neutrophils Relative %: 52 %
Platelets: 274 10*3/uL (ref 150–400)
RBC: 4.07 MIL/uL (ref 3.87–5.11)
RDW: 13.5 % (ref 11.5–15.5)
WBC: 8.1 10*3/uL (ref 4.0–10.5)
nRBC: 0 % (ref 0.0–0.2)

## 2020-09-19 LAB — MAGNESIUM: Magnesium: 2.1 mg/dL (ref 1.7–2.4)

## 2020-09-19 MED ORDER — RANOLAZINE ER 500 MG PO TB12
500.0000 mg | ORAL_TABLET | Freq: Two times a day (BID) | ORAL | Status: DC
  Administered 2020-09-19 – 2020-09-21 (×5): 500 mg via ORAL
  Filled 2020-09-19 (×6): qty 1

## 2020-09-19 MED ORDER — ISOSORBIDE DINITRATE 10 MG PO TABS
10.0000 mg | ORAL_TABLET | Freq: Three times a day (TID) | ORAL | Status: DC
  Administered 2020-09-19 – 2020-09-20 (×5): 10 mg via ORAL
  Filled 2020-09-19 (×6): qty 1

## 2020-09-19 MED ORDER — HYDRALAZINE HCL 50 MG PO TABS
100.0000 mg | ORAL_TABLET | Freq: Three times a day (TID) | ORAL | Status: DC
  Administered 2020-09-19 – 2020-09-23 (×13): 100 mg via ORAL
  Filled 2020-09-19 (×14): qty 2

## 2020-09-19 NOTE — Progress Notes (Addendum)
Progress Note  Patient Name: Jennifer [name] Date of Encounter: 09/19/2020  Primary Cardiologist: Christell Constant, MD   Subjective   Episode of chest pain overnight, sen by fellow this AM. Ranexa added to regimen and hydralazine increased to 100mg  TID. Very tearful this AM about the event. Continues to have mild "burning" but no pain.   Inpatient Medications    Scheduled Meds:  amLODipine  10 mg Oral Daily   amoxicillin-clavulanate  1 tablet Oral Q12H   atorvastatin  40 mg Oral Daily   clopidogrel  75 mg Oral Daily   enoxaparin (LOVENOX) injection  40 mg Subcutaneous Q24H   fluticasone  1 spray Each Nare Daily   hydrALAZINE  100 mg Oral Q8H   isosorbide dinitrate  10 mg Oral TID   losartan  100 mg Oral Daily   nebivolol  2.5 mg Oral q morning   pantoprazole  40 mg Oral Daily   ranolazine  500 mg Oral BID   sodium chloride flush  3 mL Intravenous Q12H   sucralfate  1 g Oral BID   Continuous Infusions:  sodium chloride     sodium chloride 1 mL/kg/hr (09/16/20 1100)   promethazine (PHENERGAN) injection (IM or IVPB) 6.25 mg (09/17/20 2323)   PRN Meds: sodium chloride, acetaminophen, hydrALAZINE, HYDROcodone-acetaminophen, morphine injection, nitroGLYCERIN, promethazine (PHENERGAN) injection (IM or IVPB), sodium chloride flush   Vital Signs    Vitals:   09/19/20 0513 09/19/20 0523 09/19/20 0534 09/19/20 0617  BP: (!) 173/84 (!) 155/68 (!) 153/63 132/69  Pulse:      Resp:      Temp:      TempSrc:      SpO2:      Weight:      Height:        Intake/Output Summary (Last 24 hours) at 09/19/2020 0733 Last data filed at 09/18/2020 2000 Gross per 24 hour  Intake 150 ml  Output --  Net 150 ml   Filed Weights   09/15/20 1226 09/16/20 0142  Weight: 91.2 kg 90.3 kg    Physical Exam   General: Obese, NAD Neck: Negative for carotid bruits. No JVD Lungs:Clear to ausculation bilaterally. No wheezes, rales, or rhonchi. Breathing is unlabored. Cardiovascular:  RRR with S1 S2. + murmurs Abdomen: Soft, non-tender, non-distended. No obvious abdominal masses. Extremities: No edema.  Neuro: Alert and oriented. No focal deficits. No facial asymmetry. MAE spontaneously. Psych: Responds to questions appropriately with normal affect.    Labs    Chemistry Recent Labs  Lab 09/17/20 0307 09/18/20 1203 09/19/20 0512  NA 138 136 138  K 3.2* 3.4* 3.7  CL 104 105 108  CO2 26 22 22   GLUCOSE 108* 124* 109*  BUN 10 9 10   CREATININE 0.74 0.75 0.77  CALCIUM 8.8* 8.4* 8.9  GFRNONAA >60 >60 >60  ANIONGAP 8 9 8      Hematology Recent Labs  Lab 09/15/20 1218 09/19/20 0512  WBC 7.4 8.1  RBC 4.05 4.07  HGB 11.6* 11.8*  HCT 35.7* 36.7  MCV 88.1 90.2  MCH 28.6 29.0  MCHC 32.5 32.2  RDW 12.9 13.5  PLT 258 274    Cardiac EnzymesNo results for input(s): TROPONINI in the last 168 hours. No results for input(s): TROPIPOC in the last 168 hours.   BNPNo results for input(s): BNP, PROBNP in the last 168 hours.   DDimer No results for input(s): DDIMER in the last 168 hours.   Radiology    No results  found.  Telemetry    09/19/20 NSR with intermittent PVCs. Episode of bradycardia this AM during CP episode - Personally Reviewed  ECG    No new tracing as of 09/19/20 - Personally Reviewed  Cardiac Studies   LHC 09/16/20:  Mid LAD lesion is 50% stenosed. 3rd Diag lesion is 60% stenosed. Dist LAD-1 lesion is 90% stenosed. Dist LAD-2 lesion is 40% stenosed.   1.  Significant one-vessel coronary artery disease involving the mid to distal LAD at the bifurcation of the third diagonal which has also 60% ostial stenosis.  The coronary arteries are very tortuous suggestive of hypertensive heart disease. 2.  Left ventricular angiography was not performed.  EF was normal by echo. 3.  Severely elevated blood pressure with mildly elevated left ventricular end-diastolic pressure. 4.  Transient left bundle branch block after engaging the left ventricle.  This  resolved by the end of the case. 5.  Abdominal aortogram was performed and showed no evidence of renal artery stenosis.   Recommendations: Unfortunately, the LAD stenosis is in a very tortuous segment and at the origin of the third diagonal branch which also has ostial disease.  PCI in this area will be high risk for dissection as well as sidebranch closure especially in the setting of uncontrolled hypertension.  In addition, the distal LAD after the stenosis is diffusely diseased. I recommend maximizing medical therapy and controlling blood pressure before considering PCI.  I added amlodipine to her antihypertensive medications. Other complicating issue is that the patient cannot tolerate aspirin long-term.  If we decide to proceed with PCI at some point, we will have to make sure she tolerates ticagrelor which can likely be used as monotherapy.    Diagnostic Dominance: Right    Intervention    Echocardiogram 09/15/20:   1. Left ventricular ejection fraction, by estimation, is 60 to 65%. The  left ventricle has normal function. The left ventricle has no regional  wall motion abnormalities. There is moderate left ventricular hypertrophy.  Left ventricular diastolic  parameters are consistent with Grade I diastolic dysfunction (impaired  relaxation). Elevated left atrial pressure. The average left ventricular  global longitudinal strain is -20.4 %. The global longitudinal strain is  normal.   2. Right ventricular systolic function is normal. The right ventricular  size is normal.   3. The mitral valve is normal in structure. Trivial mitral valve  regurgitation. No evidence of mitral stenosis.   4. The aortic valve is tricuspid. Aortic valve regurgitation is not  visualized. Mild aortic valve stenosis.   5. Aortic dilatation noted. There is mild dilatation of the ascending  aorta, measuring 43 mm.   6. The inferior vena cava is normal in size with greater than 50%  respiratory  variability, suggesting right atrial pressure of 3 mmHg.   Comparison(s): 07/11/17 EF 60-65%. PA pressure .   Patient Profile     75 y.o. female with a hx of hypertension, PVC, HLD, chronic pain syndrome with lumbar stenosis was admitted after she went to the office w/ CP and SBP 260 >> sent to ER >> cardiac CT w/ LAD stenosis >> admitted for cath.  Assessment & Plan    Chest pain: -CCTA with significant LAD disease>>subsequent LHC with patent LM, mid LAD 50%, distal LAD 90%, D3 60%. Cath noted LAD lesion with a very tortuous segment at branching point of D3, high risk for dissection vs side branch closure, and the distal LAD is diffusely diseased limiting benefit. Recommendations were for medical  management and could consider PCI if recurrent chest pain on optimal anti anginal therapy.  -Limited tolerance to ASA, continue Plavix  -Episode of recurrent chest pain this AM>>very tearful -Isordil 10 TID added this AM along with Ranexa>>plan to consolidate to Imdur if tolerating without significant HA -Continue statin and bystolic -Needs better BP control>>watch for now with the addition of the above>>may need additional antihypertensives   2. HTN: -BPs elevated early this AM with recurrent chest pain>>>seen by cardiology fellow who increased hydralazine to 100mg  TID -Continue amlodipine 10, losartan 100, bystolic 2.5 -Added Ranexa 500mg  BID this AM  -Reportedly had SBPs in the ED up to the 260 range -See plan above   3. Mild AS: -Echo this admission with mild AS -Continue statin   4. Aortic root dilatation: -59mm, ascending aorta -Will continue with serial imagining in the OP setting -Needs better OP HTN control    Signed, NP-C [facility] Pager: [phone] 09/19/2020, 7:33 AM     For questions or updates, please contact   Please consult [email or web address] for contact info under Cardiology/STEMI.  Patient seen and examined with [phone] NP-C.  Agree as  above, with the following exceptions and changes as noted below. She was having chest pain prior to my arrival but appears quite comfortable after 1 nitroglycerin, though BP did drop. Gen: NAD, CV: RRR, 2/6 SEM, Lungs: clear, Abd: soft, Extrem: Warm, well perfused, no edema, Neuro/Psych: alert and oriented x 3, normal mood and affect. All available labs, radiology testing, previous records reviewed. Patient and I discussed at length etiologies for chest pain and need for further evaluation for hypertension.  - no renal artery stenosis by abdominal aortogram. - consider workup for secondary causes of hypertension.  - PCI can be considered after hypertension is best managed and if chest pain continues. This however will be complex due to anatomy as well as patient's multiple medication intolerances including aspirin. Will discuss as HTN better managed.  09/21/2020, MD 09/19/20 12:39 PM   Total time of encounter: 40 minutes total time of encounter, including 30 minutes spent in face-to-face patient care on the date of this encounter. This time includes coordination of care and counseling regarding above mentioned problem list. Remainder of non-face-to-face time involved reviewing chart documents/testing relevant to the patient encounter and documentation in the medical record. I have independently reviewed documentation from referring provider. Time independent of NP/PA.  Parke Poisson, MD, [facility]  [facility]

## 2020-09-19 NOTE — Progress Notes (Signed)
Pt c/o chest pain, with pressure and burning sensation in left upper chest.  Pt stated it eased up a little after she laid down in bed.  Nitro SL x1 with good effect.  EKG done.  DR. Jarvis Newcomer and Dr. Jacques Navy were at bedside.  Hinton Dyer, RN

## 2020-09-19 NOTE — Progress Notes (Signed)
Pt's HR decreased to 35 BPM with a missed beat captured on telemetry. Opyd, MD made aware.See new orders. Will continue to monitor.   Bari Edward, RN

## 2020-09-19 NOTE — Progress Notes (Addendum)
Her BP has been soft this afternoon.  Currently 100/51.with HR 58 Held hydralazine.  Pt still c/o occasionally burning pain in left chest.  Will continue to monitor.  Hinton Dyer, RN

## 2020-09-19 NOTE — Progress Notes (Signed)
PT Cancellation Note  Patient Details Name: Jennifer [name] MRN: [id number] DOB: May 19, 1945   Cancelled Treatment:    Reason Eval/Treat Not Completed: Medical issues which prohibited therapy. Pt with 2 episodes of chest pain this AM. Will continue to monitor for readiness for PT eval.    Angelina Ok [facility] 09/19/2020, 11:45 AM Skip Mayer PT Acute Rehabilitation Services Pager [phone] Office [phone]

## 2020-09-19 NOTE — Progress Notes (Signed)
Paged regarding CP, 7/10 severity. Reviewed case and limited options for revasc unless persistent angina refractory to meds. 2 SLNG with improvement to 2/10 chest pain. Had 1 dose of imdur 60 mg 06/17 at 1311 which was followed by prolonged HA. Only mild HA with SLNG this AM. Will increase hydral to 100 mg PO tid (from 75 mg PO) with sBP 150s currently. Added on ranolazine 500 mg PO bid. Added on isordil 10 mg PO tid with plan to consolidate to imdur 30 mg PO daily (half dose of previous) if she tolerates. She is amenable to these changes.

## 2020-09-19 NOTE — Progress Notes (Signed)
PROGRESS NOTE  Jennifer [name]  VEL:[id number] DOB: 04/20/1945 DOA: 09/15/2020 PCP: [facility]   Brief Narrative: Jennifer [name] is a 75 y.o. female with a history of resistant HTN, HLD, GERD, interstitial cystitis requiring intermittent self-catheterization, lumbar stenosis causing chronic pain who was sent to the ED by cardiology due to chest discomfort and HTN urgency on 6/16   Assessment & Plan: Principal Problem:   Hypertensive urgency Active Problems:   GERD (gastroesophageal reflux disease)   Chest pain   Interstitial cystitis (chronic) without hematuria   Chronic low back pain   Chronic diastolic CHF (congestive heart failure) (HCC)   Unstable angina (HCC)   Hypertensive crisis  Hypertensive emergency, resistant HTN: SBP up to 260 with chest pain at [facility].  - Abdominal aortogram at time of cath did not reveal renal artery stenosis - Note hypokalemia. Would benefit from hyperaldo work up as outpatient. Would strongly recommend trial of spironolactone. Other symptoms not consistent with pheochromocytoma.  - Would prefer to avoid overly abrupt diminution of blood pressure with ARB on board impairing autoregulation. Continue BMP monitoring - Isordil, ranexa, bystolic, norvasc, losartan on board.   CAD with chest pain: CT FFR revealed significant functional stenosis in LAD.  - Continue BP control as above - Continue statin, beta blocker - Lesion not amenable to PCI (tortuosity, extensive disease distal to stenosis). Has intolerance to aspirin in the past, tolerating plavix. - Appreciate cardiology recommendations regarding chest pain. Continue NTG prn as BP will tolerate.   Chronic HFpEF: LVEF 60-65%, no WMA, mod LVH, G1DD on echo. Appears stable volumewise.  - No diuretic needed currently  GERD:  - Continue PPI, carafate. Pt denies this burning chest pain is similar to prior reflux symptoms.   Lumbar stenosis, chronic pain:  - Continue  home medications (norco)  Chronic interstitial cystitis: Quiescent at this time.   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  Obesity: Estimated body mass index is 33.12 kg/m as calculated from the following:   Height as of this encounter: 5\' 5"  (1.651 m).   Weight as of this encounter: 90.3 kg.  DVT prophylaxis: Lovenox Code Status: Full Family Communication: None at bedside Disposition Plan:  Status is: Inpatient  Remains inpatient appropriate because:Ongoing diagnostic testing needed not appropriate for outpatient work up and Inpatient level of care appropriate due to severity of illness  Dispo: The patient is from: Home              Anticipated d/c is to: Home              Patient currently is not medically stable to d/c.   Difficult to place patient No  Consultants:  Cardiology  Procedures:  LHC 09/16/20:  Mid LAD lesion is 50% stenosed. 3rd Diag lesion is 60% stenosed. Dist LAD-1 lesion is 90% stenosed. Dist LAD-2 lesion is 40% stenosed.  1.  Significant one-vessel coronary artery disease involving the mid to distal LAD at the bifurcation of the third diagonal which has also 60% ostial stenosis.  The coronary arteries are very tortuous suggestive of hypertensive heart disease. 2.  Left ventricular angiography was not performed.  EF was normal by echo. 3.  Severely elevated blood pressure with mildly elevated left ventricular end-diastolic pressure. 4.  Transient left bundle branch block after engaging the left ventricle.  This resolved by the end of the case. 5.  Abdominal aortogram was performed  and showed no evidence of renal artery stenosis.  Recommendations: Unfortunately, the LAD stenosis is in a very tortuous segment and at the origin of the third diagonal branch which also has ostial disease.  PCI in this area will be high risk for dissection as well as sidebranch closure especially in the setting of  uncontrolled hypertension.  In addition, the distal LAD after the stenosis is diffusely diseased. I recommend maximizing medical therapy and controlling blood pressure before considering PCI.  I added amlodipine to her antihypertensive medications. Other complicating issue is that the patient cannot tolerate aspirin long-term.  If we decide to proceed with PCI at some point, we will have to make sure she tolerates ticagrelor which can likely be used as monotherapy.   Antimicrobials: None   Subjective: Tearful, not necessarily out of pain directly, but the situation and still having pain. Her birthday is tomorrow.   Objective: Vitals:   09/19/20 1043 09/19/20 1125 09/19/20 1408 09/19/20 1459  BP: (!) 104/54 (!) 117/52 (!) 96/53 (!) 100/51  Pulse:      Resp:   20   Temp:   97.6 F (36.4 C)   TempSrc:   Oral   SpO2:      Weight:      Height:        Intake/Output Summary (Last 24 hours) at 09/19/2020 1658 Last data filed at 09/19/2020 0929 Gross per 24 hour  Intake 153 ml  Output --  Net 153 ml   Filed Weights   09/15/20 1226 09/16/20 0142  Weight: 91.2 kg 90.3 kg    Gen: 75 y.o. female in no distress Pulm: Non-labored breathing. Clear to auscultation bilaterally.  CV: Regular rate and rhythm. II/VI SEM at base, no other murmur, rub, or gallop. No JVD, no pitting pedal edema. GI: Abdomen soft, non-tender, non-distended, with normoactive bowel sounds. No organomegaly or masses felt. Ext: Warm, no deformities Skin: No rashes, lesions or ulcers on visualized skin. Neuro: Alert and oriented. No focal neurological deficits. Psych: Judgement and insight appear normal. Mood labile & affect broad.  Data Reviewed: I have personally reviewed following labs and imaging studies  CBC: Recent Labs  Lab 09/15/20 1218 09/19/20 0512  WBC 7.4 8.1  NEUTROABS  --  4.2  HGB 11.6* 11.8*  HCT 35.7* 36.7  MCV 88.1 90.2  PLT 258 274   Basic Metabolic Panel: Recent Labs  Lab  09/15/20 1218 09/17/20 0307 09/18/20 1203 09/19/20 0512  NA 140 138 136 138  K 3.6 3.2* 3.4* 3.7  CL 106 104 105 108  CO2 24 26 22 22   GLUCOSE 101* 108* 124* 109*  BUN 8 10 9 10   CREATININE 0.71 0.74 0.75 0.77  CALCIUM 8.6* 8.8* 8.4* 8.9  MG  --   --   --  2.1   Recent Results (from the past 240 hour(s))  SARS CORONAVIRUS 2 (TAT 6-24 HRS) Nasopharyngeal Nasopharyngeal Swab     Status: None   Collection Time: 09/15/20  9:01 PM   Specimen: Nasopharyngeal Swab  Result Value Ref Range Status   SARS Coronavirus 2 NEGATIVE NEGATIVE Final    Comment: (NOTE) SARS-CoV-2 target nucleic acids are NOT DETECTED.  The SARS-CoV-2 RNA is generally detectable in upper and lower respiratory specimens during the acute phase of infection. Negative results do not preclude SARS-CoV-2 infection, do not rule out co-infections with other pathogens, and should not be used as the sole basis for treatment or other patient management decisions. Negative results must be combined  with clinical observations, patient history, and epidemiological information. The expected result is Negative.  Fact Sheet for Patients: HairSlick.no  Fact Sheet for Healthcare Providers: quierodirigir.com  This test is not yet approved or cleared by the [place] FDA and  has been authorized for detection and/or diagnosis of SARS-CoV-2 by FDA under an Emergency Use Authorization (EUA). This EUA will remain  in effect (meaning this test can be used) for the duration of the COVID-19 declaration under Se ction 564(b)(1) of the Act, 21 U.S.C. section 360bbb-3(b)(1), unless the authorization is terminated or revoked sooner.  Performed at [facility], [address], [place], [place] [address]   Culture, Urine     Status: None   Collection Time: 09/16/20  1:27 PM   Specimen: Urine, Clean Catch  Result Value Ref Range Status   Specimen Description URINE, CLEAN  CATCH  Final   Special Requests NONE  Final   Culture   Final    NO GROWTH Performed at [facility], [address], [place], [place] [address]    Report Status 09/17/2020 FINAL  Final      Radiology Studies: No results found.  Scheduled Meds:  amLODipine  10 mg Oral Daily   amoxicillin-clavulanate  1 tablet Oral Q12H   atorvastatin  40 mg Oral Daily   clopidogrel  75 mg Oral Daily   enoxaparin (LOVENOX) injection  40 mg Subcutaneous Q24H   fluticasone  1 spray Each Nare Daily   hydrALAZINE  100 mg Oral Q8H   isosorbide dinitrate  10 mg Oral TID   losartan  100 mg Oral Daily   nebivolol  2.5 mg Oral q morning   pantoprazole  40 mg Oral Daily   ranolazine  500 mg Oral BID   sodium chloride flush  3 mL Intravenous Q12H   sucralfate  1 g Oral BID   Continuous Infusions:  sodium chloride     sodium chloride 1 mL/kg/hr (09/16/20 1100)   promethazine (PHENERGAN) injection (IM or IVPB) 6.25 mg (09/17/20 2323)     LOS: 3 days   Time spent: 25 minutes.  Tyrone Nine, MD [facility] [email or web address] 09/19/2020, 4:58 PM

## 2020-09-19 NOTE — Progress Notes (Signed)
Pt experiencing 6/10 chest pain, 12-lead EKG obtained, pt refusing nitro d/t side effects. Opyd, MD made aware. New IV placed and IV morphine administered. Will continue to monitor.   Bari Edward, RN

## 2020-09-20 LAB — BASIC METABOLIC PANEL
Anion gap: 10 (ref 5–15)
BUN: 15 mg/dL (ref 8–23)
CO2: 23 mmol/L (ref 22–32)
Calcium: 9.1 mg/dL (ref 8.9–10.3)
Chloride: 106 mmol/L (ref 98–111)
Creatinine, Ser: 0.82 mg/dL (ref 0.44–1.00)
GFR, Estimated: >60 mL/min (ref >60)
Glucose, Bld: 129 mg/dL — ABNORMAL HIGH (ref 70–99)
Potassium: 3.8 mmol/L (ref 3.5–5.1)
Sodium: 139 mmol/L (ref 135–145)

## 2020-09-20 MED ORDER — ISOSORBIDE DINITRATE 10 MG PO TABS
20.0000 mg | ORAL_TABLET | Freq: Three times a day (TID) | ORAL | Status: DC
  Administered 2020-09-20 – 2020-09-23 (×9): 20 mg via ORAL
  Filled 2020-09-20 (×11): qty 2

## 2020-09-20 NOTE — Progress Notes (Signed)
PROGRESS NOTE  [name]  LNL:[id number] DOB: Oct 06, 1945 DOA: 09/15/2020 PCP: [facility]   Brief Narrative: Jennifer [name] is a 75 y.o. female with a history of resistant HTN, HLD, GERD, interstitial cystitis requiring intermittent self-catheterization, lumbar stenosis causing chronic pain who was sent to the ED by cardiology due to chest discomfort and HTN urgency on 6/16   Assessment & Plan: Principal Problem:   Hypertensive urgency Active Problems:   GERD (gastroesophageal reflux disease)   Chest pain   Interstitial cystitis (chronic) without hematuria   Chronic low back pain   Chronic diastolic CHF (congestive heart failure) (HCC)   Unstable angina (HCC)   Hypertensive crisis  Hypertensive emergency, resistant HTN: SBP up to 260 with chest pain at [facility].  - Abdominal aortogram at time of cath did not reveal renal artery stenosis. Aldo + renin w/ratio lab checked 6/19, pending.  - Consider spironolactone. Other symptoms not consistent with pheochromocytoma.  - Appreciate cardiology recommendations: Will continue treatment with hopes of continue improvement and avoidance of interventions. Isordil, ranexa, bystolic, norvasc, losartan on board. BP improving, pt appears to be tolerating current regimen.  CAD with chest pain: CT FFR revealed significant functional stenosis in LAD.  - Continue BP control as above - Continue statin, beta blocker - Lesion not amenable to PCI (tortuosity, extensive disease distal to stenosis). Has intolerance to aspirin in the past, tolerating plavix. - Appreciate cardiology recommendations regarding chest pain. Continue NTG prn as BP will tolerate. Reports abnormal symptoms that may be exacerbated by anxiety.  Chronic HFpEF: LVEF 60-65%, no WMA, mod LVH, G1DD on echo. Appears stable volumewise.  - No diuretic needed currently  GERD:  - Continue PPI, carafate. Pt denies this burning chest pain is similar to  prior reflux symptoms.   Lumbar stenosis, chronic pain:  - Continue home medications (norco)  Chronic interstitial cystitis: Quiescent at this time.   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  Obesity: Estimated body mass index is 33.12 kg/m as calculated from the following:   Height as of this encounter: 5\' 5"  (1.651 m).   Weight as of this encounter: 90.3 kg.  DVT prophylaxis: Lovenox Code Status: Full Family Communication: None at bedside Disposition Plan:  Status is: Inpatient  Remains inpatient appropriate because:Ongoing diagnostic testing needed not appropriate for outpatient work up and Inpatient level of care appropriate due to severity of illness  Dispo: The patient is from: Home              Anticipated d/c is to: Home              Patient currently is not medically stable to d/c.   Difficult to place patient No  Consultants:  Cardiology  Procedures:  LHC 09/16/20:  Mid LAD lesion is 50% stenosed. 3rd Diag lesion is 60% stenosed. Dist LAD-1 lesion is 90% stenosed. Dist LAD-2 lesion is 40% stenosed.  1.  Significant one-vessel coronary artery disease involving the mid to distal LAD at the bifurcation of the third diagonal which has also 60% ostial stenosis.  The coronary arteries are very tortuous suggestive of hypertensive heart disease. 2.  Left ventricular angiography was not performed.  EF was normal by echo. 3.  Severely elevated blood pressure with mildly elevated left ventricular end-diastolic pressure. 4.  Transient left bundle branch block after engaging the left ventricle.  This resolved by the end of the case.  5.  Abdominal aortogram was performed and showed no evidence of renal artery stenosis.  Recommendations: Unfortunately, the LAD stenosis is in a very tortuous segment and at the origin of the third diagonal branch which also has ostial disease.  PCI in this area will be high risk for  dissection as well as sidebranch closure especially in the setting of uncontrolled hypertension.  In addition, the distal LAD after the stenosis is diffusely diseased. I recommend maximizing medical therapy and controlling blood pressure before considering PCI.  I added amlodipine to her antihypertensive medications. Other complicating issue is that the patient cannot tolerate aspirin long-term.  If we decide to proceed with PCI at some point, we will have to make sure she tolerates ticagrelor which can likely be used as monotherapy.   Antimicrobials: None   Subjective: Sitting in chair, worked with PT and feels worn out but denies exertional chest pain or dyspnea during that time, just limited tolerance. Feels fatigued and find it hard to elaborate, but no actual weakness. Some anxiety reported but appreciative of care she's received. Wishes her a happy birthday.   Objective: Vitals:   09/19/20 2135 09/20/20 0514 09/20/20 1010 09/20/20 1238  BP: (!) 125/59 138/66 (!) 131/55 (!) 108/51  Pulse:  65  (!) 58  Resp:    18  Temp:  98 F (36.7 C)  98.2 F (36.8 C)  TempSrc:  Oral  Oral  SpO2:  100%    Weight:      Height:        Intake/Output Summary (Last 24 hours) at 09/20/2020 1319 Last data filed at 09/20/2020 0516 Gross per 24 hour  Intake --  Output 400 ml  Net -400 ml   Filed Weights   09/15/20 1226 09/16/20 0142  Weight: 91.2 kg 90.3 kg   Gen: 75 y.o. female in no distress Pulm: Nonlabored breathing room air. Clear. CV: Regular rate and rhythm. No murmur, rub, or gallop. No JVD, no dependent edema. GI: Abdomen soft, non-tender, non-distended, with normoactive bowel sounds.  Ext: Warm, no deformities Skin: No rashes, lesions or ulcers on visualized skin. Neuro: Alert and oriented. No focal neurological deficits. Psych: Judgement and insight appear fair. Mood euthymic & affect congruent. Behavior is appropriate.    Data Reviewed: I have personally reviewed following labs  and imaging studies  CBC: Recent Labs  Lab 09/15/20 1218 09/19/20 0512  WBC 7.4 8.1  NEUTROABS  --  4.2  HGB 11.6* 11.8*  HCT 35.7* 36.7  MCV 88.1 90.2  PLT 258 274   Basic Metabolic Panel: Recent Labs  Lab 09/15/20 1218 09/17/20 0307 09/18/20 1203 09/19/20 0512 09/20/20 0905  NA 140 138 136 138 139  K 3.6 3.2* 3.4* 3.7 3.8  CL 106 104 105 108 106  CO2 24 26 22 22 23   GLUCOSE 101* 108* 124* 109* 129*  BUN 8 10 9 10 15   CREATININE 0.71 0.74 0.75 0.77 0.82  CALCIUM 8.6* 8.8* 8.4* 8.9 9.1  MG  --   --   --  2.1  --    Recent Results (from the past 240 hour(s))  SARS CORONAVIRUS 2 (TAT 6-24 HRS) Nasopharyngeal Nasopharyngeal Swab     Status: None   Collection Time: 09/15/20  9:01 PM   Specimen: Nasopharyngeal Swab  Result Value Ref Range Status   SARS Coronavirus 2 NEGATIVE NEGATIVE Final    Comment: (NOTE) SARS-CoV-2 target nucleic acids are NOT DETECTED.  The SARS-CoV-2 RNA is generally detectable in upper and  lower respiratory specimens during the acute phase of infection. Negative results do not preclude SARS-CoV-2 infection, do not rule out co-infections with other pathogens, and should not be used as the sole basis for treatment or other patient management decisions. Negative results must be combined with clinical observations, patient history, and epidemiological information. The expected result is Negative.  Fact Sheet for Patients: HairSlick.no  Fact Sheet for Healthcare Providers: quierodirigir.com  This test is not yet approved or cleared by the [place] FDA and  has been authorized for detection and/or diagnosis of SARS-CoV-2 by FDA under an Emergency Use Authorization (EUA). This EUA will remain  in effect (meaning this test can be used) for the duration of the COVID-19 declaration under Se ction 564(b)(1) of the Act, 21 U.S.C. section 360bbb-3(b)(1), unless the authorization is terminated  or revoked sooner.  Performed at [facility], [address] [place], [place] [address]   Culture, Urine     Status: None   Collection Time: 09/16/20  1:27 PM   Specimen: Urine, Clean Catch  Result Value Ref Range Status   Specimen Description URINE, CLEAN CATCH  Final   Special Requests NONE  Final   Culture   Final    NO GROWTH Performed at [facility], [address], [place], [place] [address]    Report Status 09/17/2020 FINAL  Final      Radiology Studies: No results found.  Scheduled Meds:  amLODipine  10 mg Oral Daily   amoxicillin-clavulanate  1 tablet Oral Q12H   atorvastatin  40 mg Oral Daily   clopidogrel  75 mg Oral Daily   enoxaparin (LOVENOX) injection  40 mg Subcutaneous Q24H   fluticasone  1 spray Each Nare Daily   hydrALAZINE  100 mg Oral Q8H   isosorbide dinitrate  10 mg Oral TID   losartan  100 mg Oral Daily   nebivolol  2.5 mg Oral q morning   pantoprazole  40 mg Oral Daily   ranolazine  500 mg Oral BID   sodium chloride flush  3 mL Intravenous Q12H   sucralfate  1 g Oral BID   Continuous Infusions:  sodium chloride     sodium chloride 1 mL/kg/hr (09/16/20 1100)   promethazine (PHENERGAN) injection (IM or IVPB) 6.25 mg (09/17/20 2323)     LOS: 4 days   Time spent: 25 minutes.  Tyrone Nine, MD [facility] [email or web address] 09/20/2020, 1:19 PM

## 2020-09-20 NOTE — Evaluation (Signed)
Physical Therapy Evaluation Patient Details Name: Jennifer [name] MRN: [id number] DOB: 1945-04-06 Today's Date: 09/20/2020   History of Present Illness  Pt is a 75 y.o. female admitted 09/15/20 with c/o chest pain. Workup for hypertensive urgency. Workup revealed significant stenosis in LAD, not amenable to PCI. PMH includes HTN, HLD, insterstitial cystitis requiring intermittent self-cath, lumbar stenosis, chronic pain, fibromyalgia.   Clinical Impression  Pt presents with an overall decrease in functional mobility secondary to above. PTA, pt independent, lives alone, reports primarily limited by chronic back pain. Today, pt mobilizing well throughout room, independent with ADLs; pt declines further ambulation secondary to "my chest just feels so weak." Educ on precautions, activity recommendations and importance of mobility. Pt would benefit from continued acute PT services to maximize functional mobility and independence prior to d/c with outpatient PT services to further address lower back concerns.  Sitting - BP 136/70, HR 78 Standing - BP 146/80, HR 98 Post-ambulation - BP 191/64    Follow Up Recommendations Outpatient PT    Equipment Recommendations  None recommended by PT    Recommendations for Other Services       Precautions / Restrictions Precautions Precautions: Fall Restrictions Weight Bearing Restrictions: No      Mobility  Bed Mobility               General bed mobility comments: Received sitting in recliner    Transfers Overall transfer level: Independent Equipment used: None                Ambulation/Gait Ambulation/Gait assistance: Supervision Gait Distance (Feet): 120 Feet Assistive device: None Gait Pattern/deviations: Step-through pattern;Decreased stride length Gait velocity: Decreased   General Gait Details: Pt declined hallway ambulation secondary to "feeling weak." Able to ambulating multiple laps throughout room without overt  instability or LOB, pt c/o lower back and RLE pain limiting distance  Stairs            Wheelchair Mobility    Modified Rankin (Stroke Patients Only)       Balance Overall balance assessment: No apparent balance deficits (not formally assessed)   Sitting balance-Leahy Scale: Good       Standing balance-Leahy Scale: Good                               Pertinent Vitals/Pain Pain Assessment: Faces Faces Pain Scale: Hurts little more Pain Location: Lower back into BLEs (RLE > LLE) Pain Descriptors / Indicators: Discomfort;Guarding Pain Intervention(s): Monitored during session;Repositioned    Home Living Family/patient expects to be discharged to:: Private residence Living Arrangements: Alone Available Help at Discharge: Friend(s);Available PRN/intermittently Type of Home: Apartment Home Access: Stairs to enter Entrance Stairs-Rails: Right;Left;Can reach both Entrance Stairs-Number of Steps: 2 flights Home Layout: One level Home Equipment: Walker - 2 wheels Additional Comments: Does not currently have a car; friend typically able to provide transportation    Prior Function Level of Independence: Independent with assistive device(s)         Comments: Independent with community ambulation, but uses walker around her apartment; drives, but does not currently have a car; reports lumbar stenosis limits ability to be active and socialize     Hand Dominance        Extremity/Trunk Assessment   Upper Extremity Assessment Upper Extremity Assessment: Overall WFL for tasks assessed    Lower Extremity Assessment Lower Extremity Assessment: RLE deficits/detail RLE Deficits / Details: Reports slight numbness to touch  on R latera knee; knee flex/ext 4/5, hip flex 4/5, ankle 5/5       Communication   Communication: No difficulties  Cognition Arousal/Alertness: Awake/alert Behavior During Therapy: WFL for tasks assessed/performed Overall Cognitive  Status: No family/caregiver present to determine baseline cognitive functioning Area of Impairment: Attention                   Current Attention Level: Selective           General Comments: WFL for majority of simple tasks; pt verbose in conversation and quick to switch subjects requiring intermittent redirection to current topic/task      General Comments General comments (skin integrity, edema, etc.): Discussed recommendation for outpatient PT to address lower back issues    Exercises     Assessment/Plan    PT Assessment Patient needs continued PT services  PT Problem List Decreased strength;Decreased activity tolerance;Decreased balance;Decreased mobility;Pain;Cardiopulmonary status limiting activity       PT Treatment Interventions DME instruction;Gait training;Stair training;Functional mobility training;Therapeutic activities;Therapeutic exercise;Balance training;Patient/family education    PT Goals (Current goals can be found in the Care Plan section)  Acute Rehab PT Goals Patient Stated Goal: Decreased back pain, be able to socialize more PT Goal Formulation: With patient Time For Goal Achievement: 10/04/20 Potential to Achieve Goals: Good    Frequency Min 3X/week   Barriers to discharge Decreased caregiver support      Co-evaluation               AM-PAC PT "6 Clicks" Mobility  Outcome Measure Help needed turning from your back to your side while in a flat bed without using bedrails?: None Help needed moving from lying on your back to sitting on the side of a flat bed without using bedrails?: None Help needed moving to and from a bed to a chair (including a wheelchair)?: None Help needed standing up from a chair using your arms (e.g., wheelchair or bedside chair)?: None Help needed to walk in hospital room?: A Little Help needed climbing 3-5 steps with a railing? : A Little 6 Click Score: 22    End of Session   Activity Tolerance: Patient  tolerated treatment well;Patient limited by pain Patient left: in chair;with call bell/phone within reach Nurse Communication: Mobility status PT Visit Diagnosis: Other abnormalities of gait and mobility (R26.89);Pain    Time: 4536-4680 PT Time Calculation (min) (ACUTE ONLY): 15 min   Charges:   PT Evaluation $PT Eval Low Complexity: 1 Low        Ina Homes, PT, DPT Acute Rehabilitation Services  Pager [phone] Office [phone]  Malachy Chamber 09/20/2020, 11:16 AM

## 2020-09-20 NOTE — Progress Notes (Addendum)
Progress Note  Patient Name: Jennifer [name] Date of Encounter: 09/20/2020  Primary Cardiologist: Christell Constant, MD   Subjective   Feeling (and looks) better today. Up to chair. No CP. BP improved.   Inpatient Medications    Scheduled Meds:  amLODipine  10 mg Oral Daily   amoxicillin-clavulanate  1 tablet Oral Q12H   atorvastatin  40 mg Oral Daily   clopidogrel  75 mg Oral Daily   enoxaparin (LOVENOX) injection  40 mg Subcutaneous Q24H   fluticasone  1 spray Each Nare Daily   hydrALAZINE  100 mg Oral Q8H   isosorbide dinitrate  10 mg Oral TID   losartan  100 mg Oral Daily   nebivolol  2.5 mg Oral q morning   pantoprazole  40 mg Oral Daily   ranolazine  500 mg Oral BID   sodium chloride flush  3 mL Intravenous Q12H   sucralfate  1 g Oral BID   Continuous Infusions:  sodium chloride     sodium chloride 1 mL/kg/hr (09/16/20 1100)   promethazine (PHENERGAN) injection (IM or IVPB) 6.25 mg (09/17/20 2323)   PRN Meds: sodium chloride, acetaminophen, hydrALAZINE, HYDROcodone-acetaminophen, morphine injection, nitroGLYCERIN, promethazine (PHENERGAN) injection (IM or IVPB), sodium chloride flush   Vital Signs    Vitals:   09/19/20 1459 09/19/20 1725 09/19/20 2135 09/20/20 0514  BP: (!) 100/51 123/65 (!) 125/59 138/66  Pulse:    65  Resp:      Temp:    98 F (36.7 C)  TempSrc:    Oral  SpO2:    100%  Weight:      Height:        Intake/Output Summary (Last 24 hours) at 09/20/2020 0734 Last data filed at 09/20/2020 0516 Gross per 24 hour  Intake 3 ml  Output 400 ml  Net -397 ml   Filed Weights   09/15/20 1226 09/16/20 0142  Weight: 91.2 kg 90.3 kg    Physical Exam   General: Well developed, well nourished, NAD Lungs:Clear to ausculation bilaterally. No wheezes, rales, or rhonchi. Breathing is unlabored. Cardiovascular: RRR with S1 S2. No murmurs Extremities: No edema. Neuro: Alert and oriented. No focal deficits. No facial asymmetry. MAE  spontaneously. Psych: Responds to questions appropriately with normal affect.    Labs    Chemistry Recent Labs  Lab 09/17/20 0307 09/18/20 1203 09/19/20 0512  NA 138 136 138  K 3.2* 3.4* 3.7  CL 104 105 108  CO2 26 22 22   GLUCOSE 108* 124* 109*  BUN 10 9 10   CREATININE 0.74 0.75 0.77  CALCIUM 8.8* 8.4* 8.9  GFRNONAA >60 >60 >60  ANIONGAP 8 9 8      Hematology Recent Labs  Lab 09/15/20 1218 09/19/20 0512  WBC 7.4 8.1  RBC 4.05 4.07  HGB 11.6* 11.8*  HCT 35.7* 36.7  MCV 88.1 90.2  MCH 28.6 29.0  MCHC 32.5 32.2  RDW 12.9 13.5  PLT 258 274    Cardiac EnzymesNo results for input(s): TROPONINI in the last 168 hours. No results for input(s): TROPIPOC in the last 168 hours.   BNPNo results for input(s): BNP, PROBNP in the last 168 hours.   DDimer No results for input(s): DDIMER in the last 168 hours.   Radiology    No results found.  Telemetry    09/20/20 NSR  - Personally Reviewed  ECG    No new tracing as of 09/20/20 - Personally Reviewed  Cardiac Studies   LHC 09/16/20:  Mid LAD lesion is 50% stenosed. 3rd Diag lesion is 60% stenosed. Dist LAD-1 lesion is 90% stenosed. Dist LAD-2 lesion is 40% stenosed.   1.  Significant one-vessel coronary artery disease involving the mid to distal LAD at the bifurcation of the third diagonal which has also 60% ostial stenosis.  The coronary arteries are very tortuous suggestive of hypertensive heart disease. 2.  Left ventricular angiography was not performed.  EF was normal by echo. 3.  Severely elevated blood pressure with mildly elevated left ventricular end-diastolic pressure. 4.  Transient left bundle branch block after engaging the left ventricle.  This resolved by the end of the case. 5.  Abdominal aortogram was performed and showed no evidence of renal artery stenosis.   Recommendations: Unfortunately, the LAD stenosis is in a very tortuous segment and at the origin of the third diagonal branch which also has  ostial disease.  PCI in this area will be high risk for dissection as well as sidebranch closure especially in the setting of uncontrolled hypertension.  In addition, the distal LAD after the stenosis is diffusely diseased. I recommend maximizing medical therapy and controlling blood pressure before considering PCI.  I added amlodipine to her antihypertensive medications. Other complicating issue is that the patient cannot tolerate aspirin long-term.  If we decide to proceed with PCI at some point, we will have to make sure she tolerates ticagrelor which can likely be used as monotherapy.      Diagnostic Dominance: Right      Intervention       Echocardiogram 09/15/20:   1. Left ventricular ejection fraction, by estimation, is 60 to 65%. The  left ventricle has normal function. The left ventricle has no regional  wall motion abnormalities. There is moderate left ventricular hypertrophy.  Left ventricular diastolic  parameters are consistent with Grade I diastolic dysfunction (impaired  relaxation). Elevated left atrial pressure. The average left ventricular  global longitudinal strain is -20.4 %. The global longitudinal strain is  normal.   2. Right ventricular systolic function is normal. The right ventricular  size is normal.   3. The mitral valve is normal in structure. Trivial mitral valve  regurgitation. No evidence of mitral stenosis.   4. The aortic valve is tricuspid. Aortic valve regurgitation is not  visualized. Mild aortic valve stenosis.   5. Aortic dilatation noted. There is mild dilatation of the ascending  aorta, measuring 43 mm.   6. The inferior vena cava is normal in size with greater than 50%  respiratory variability, suggesting right atrial pressure of 3 mmHg.   Comparison(s): 07/11/17 EF 60-65%. PA pressure .  Patient Profile     75 y.o. female with a hx of hypertension, PVC, HLD, chronic pain syndrome with lumbar stenosis was admitted after she went to  the office w/ CP and SBP 260 >> sent to ER >> cardiac CT w/ LAD stenosis >> admitted for cath.  Assessment & Plan     Chest pain: -CCTA with significant LAD disease>>subsequent LHC with patent LM, mid LAD 50%, distal LAD 90%, D3 60%. Cath noted LAD lesion with a very tortuous segment at branching point of D3, high risk for dissection vs side branch closure, and the distal LAD is diffusely diseased limiting benefit. Recommendations were for medical management and could consider PCI if recurrent chest pain on optimal anti anginal therapy. -Limited tolerance to ASA, continue Plavix  -Isordil 10 TID added along with Ranexa>>plan to consolidate to Imdur if tolerating without significant HA  by discharge  -Continue statin and bystolic -BPs improved today at 100/51>>123/65>>125/59  2. HTN: -BPs elevated on admission and until yesterday -Now more controlled 125/59>>123/65>>100/51 -Continue amlodipine 10, hydralazine 100, losartan 100, bystolic 2.5 -Ranexa added yesterday 500 BID    3. Mild AS: -Echo this admission with mild AS -Continue statin   4. Aortic root dilatation: -70mm, ascending aorta -Will continue with serial imagining in the OP setting -Needs better OP HTN control   Signed, Georgie Chard NP-C [facility] Pager: [phone] 09/20/2020, 7:34 AM     For questions or updates, please contact   Please consult [email or web address] for contact info under Cardiology/STEMI.  Patient seen and examined with Georgie Chard, NP-C.  Agree as above, with the following exceptions and changes as noted below.  Patient feels slightly better today but still notes exertional chest pain and pain with straining on the toilet. Gen: NAD, CV: RRR, no murmurs, Lungs: clear, Abd: soft, Extrem: Warm, well perfused, no edema, Neuro/Psych: alert and oriented x 3, normal mood and affect. All available labs, radiology testing, previous records reviewed.  We discussed as needed sublingual nitroglycerin for chest pain  which she can take Tylenol beforehand with a limit of 3 g of Tylenol per day total if this helps.  Based on her coronary anatomy it would be most advantageous to try to medically manage and I have described this in detail for the patient.  If chest pain remains refractory despite good blood pressure control could consider complex intervention, though the patient does not tolerate aspirin and we have discussed that this would be the optimal component of dual antiplatelet therapy.  I will titrate her Isordil to 20 mg 3 times daily and see if this helps with the chest pain without creating significant hypertension.  She is allergic to yellow dye and it appears the Isordil 20 mg tablets may contain this.  I would recommend that she stay with the 10 mg tablets but she can take 2 tablets for a total dose of 20 mg 3 times daily.  I have written these instructions for the pharmacist to adjust the prescription.  If this is unsuccessful, we may want to consider cardiac catheterization later this week with attempt at intervention if the patient is agreeable.  Blood pressure is quite labile though no secondary cause identified as of yet.  Parke Poisson, MD 09/20/20 5:55 PM

## 2020-09-21 LAB — BASIC METABOLIC PANEL
Anion gap: 8 (ref 5–15)
BUN: 14 mg/dL (ref 8–23)
CO2: 25 mmol/L (ref 22–32)
Calcium: 8.9 mg/dL (ref 8.9–10.3)
Chloride: 106 mmol/L (ref 98–111)
Creatinine, Ser: 0.78 mg/dL (ref 0.44–1.00)
GFR, Estimated: >60 mL/min (ref >60)
Glucose, Bld: 113 mg/dL — ABNORMAL HIGH (ref 70–99)
Potassium: 3.9 mmol/L (ref 3.5–5.1)
Sodium: 139 mmol/L (ref 135–145)

## 2020-09-21 MED ORDER — FLUCONAZOLE 100 MG PO TABS
100.0000 mg | ORAL_TABLET | Freq: Every day | ORAL | Status: DC
  Administered 2020-09-21 – 2020-09-27 (×7): 100 mg via ORAL
  Filled 2020-09-21 (×7): qty 1

## 2020-09-21 MED ORDER — OMEPRAZOLE 20 MG PO CPDR
20.0000 mg | DELAYED_RELEASE_CAPSULE | Freq: Every day | ORAL | Status: DC
  Administered 2020-09-21 – 2020-09-22 (×2): 20 mg via ORAL
  Filled 2020-09-21 (×2): qty 1

## 2020-09-21 MED ORDER — RANOLAZINE ER 500 MG PO TB12
1000.0000 mg | ORAL_TABLET | Freq: Two times a day (BID) | ORAL | Status: DC
  Administered 2020-09-21 – 2020-09-22 (×2): 1000 mg via ORAL
  Filled 2020-09-21 (×2): qty 2

## 2020-09-21 MED ORDER — NON FORMULARY: 20.0000 mg | Freq: Every day | Status: DC

## 2020-09-21 NOTE — Progress Notes (Signed)
PROGRESS NOTE  Jennifer [name]  PPJ:[id number] DOB: 1945/09/05 DOA: 09/15/2020 PCP: [facility]   Brief Narrative: Jennifer [name] is a 75 y.o. female with a history of resistant HTN, HLD, GERD, interstitial cystitis requiring intermittent self-catheterization, lumbar stenosis causing chronic pain who was sent to the ED by cardiology due to chest discomfort and HTN urgency on 6/16.  Assessment & Plan: Principal Problem:   Hypertensive urgency Active Problems:   GERD (gastroesophageal reflux disease)   Chest pain   Interstitial cystitis (chronic) without hematuria   Chronic low back pain   Chronic diastolic CHF (congestive heart failure) (HCC)   Unstable angina (HCC)   Hypertensive crisis  Hypertensive emergency, resistant HTN: SBP up to 260 with chest pain at [facility].  - Abdominal aortogram at time of cath did not reveal renal artery stenosis. Aldo + renin w/ratio lab checked 6/19, remains pending.  - Consider spironolactone if further BP medication is needed. Other symptoms not consistent with pheochromocytoma, though dedicated work up could be pursued as an outpatient.  - Appreciate cardiology recommendations: Will continue treatment with hopes of continue improvement and avoidance of interventions. Isordil, ranexa, bystolic, norvasc, losartan on board. BP improving, pt appears to be tolerating current regimen. Ranexa dose increased today.   CAD with chest pain: CT FFR revealed significant functional stenosis in LAD.  - Continue BP control as above - Continue statin, beta blocker - Lesion not amenable to PCI (tortuosity, extensive disease distal to stenosis). Multiple opinions sought with consistent fear that risk of cardiac event (coronary dissection and/or occlusion of sidebranch) is much more likely than any benefit that may be derived from attempted PCI. Hopeful for optimization of medical therapy and patient observation for development of  collaterals. - Continue NTG prn as BP will tolerate. Reports abnormal symptoms that may be exacerbated by anxiety, though with broad scope of medication intolerances, we will not initiate pharmacotherapy at this time.   Chronic HFpEF: LVEF 60-65%, no WMA, mod LVH, G1DD on echo. Appears stable volumewise.  - No diuretic needed currently  GERD:  - Continue PPI, carafate. Pt reports aggravation of IC symptoms with formulary protonix. I've ordered omeprazole (tolerated at home longterm).   Lumbar stenosis, chronic pain:  - Continue home medications (norco)  Chronic interstitial cystitis:  - No changes to management currently.  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  Obesity: Estimated body mass index is 33.12 kg/m as calculated from the following:   Height as of this encounter: 5\' 5"  (1.651 m).   Weight as of this encounter: 90.3 kg.  DVT prophylaxis: Lovenox Code Status: Full Family Communication: None at bedside Disposition Plan:  Status is: Inpatient  Remains inpatient appropriate because:Ongoing diagnostic testing needed not appropriate for outpatient work up and Inpatient level of care appropriate due to severity of illness  Dispo: The patient is from: Home              Anticipated d/c is to: Home              Patient currently is not medically stable to d/c.   Difficult to place patient No  Consultants:  Cardiology  Procedures:  LHC 09/16/20:  Mid LAD lesion is 50% stenosed. 3rd Diag lesion is 60% stenosed. Dist LAD-1 lesion is 90% stenosed. Dist LAD-2 lesion is 40% stenosed.  1.  Significant one-vessel coronary artery disease involving the mid to distal LAD at  the bifurcation of the third diagonal which has also 60% ostial stenosis.  The coronary arteries are very tortuous suggestive of hypertensive heart disease. 2.  Left ventricular angiography was not performed.  EF was normal by echo. 3.  Severely  elevated blood pressure with mildly elevated left ventricular end-diastolic pressure. 4.  Transient left bundle branch block after engaging the left ventricle.  This resolved by the end of the case. 5.  Abdominal aortogram was performed and showed no evidence of renal artery stenosis.  Recommendations: Unfortunately, the LAD stenosis is in a very tortuous segment and at the origin of the third diagonal branch which also has ostial disease.  PCI in this area will be high risk for dissection as well as sidebranch closure especially in the setting of uncontrolled hypertension.  In addition, the distal LAD after the stenosis is diffusely diseased. I recommend maximizing medical therapy and controlling blood pressure before considering PCI.  I added amlodipine to her antihypertensive medications. Other complicating issue is that the patient cannot tolerate aspirin long-term.  If we decide to proceed with PCI at some point, we will have to make sure she tolerates ticagrelor which can likely be used as monotherapy.   Antimicrobials: None   Subjective: Pt reports she'd like to pursue catheterization if this is offered, continues to have chest discomfort that is hard to characterize and she's preoccupied with concern that if she goes home this will worsen. No leg swelling, orthopnea, or other new symptoms. Had urinary frequency last night, thought to have been precipitated by protonix as it has been before. No fevers, no leukocytosis. Reports she's developing a rash that has been treated with diflucan in the past.   Objective: Vitals:   09/21/20 0739 09/21/20 1031 09/21/20 1100 09/21/20 1412  BP: (!) 148/63 (!) 112/48 133/72 (!) 118/52  Pulse: 72  67   Resp: 18  18   Temp: 98.3 F (36.8 C)  98.2 F (36.8 C)   TempSrc: Oral  Oral   SpO2:      Weight:      Height:        Intake/Output Summary (Last 24 hours) at 09/21/2020 1609 Last data filed at 09/21/2020 0600 Gross per 24 hour  Intake 3 ml   Output 900 ml  Net -897 ml   Filed Weights   09/15/20 1226 09/16/20 0142  Weight: 91.2 kg 90.3 kg   Gen: 75 y.o. female in no distress Pulm: Nonlabored breathing room air. Clear. CV: Regular rate and rhythm. No murmur, rub, or gallop. No JVD, no dependent edema. GI: Abdomen soft, non-tender, non-distended, with normoactive bowel sounds.  Ext: Warm, no deformities Skin: No rashes, lesions or ulcers on visualized skin. Neuro: Alert and oriented. No focal neurological deficits. Psych: Judgement and insight appear fair. Mood euthymic & affect congruent. Behavior is appropriate.    Data Reviewed: I have personally reviewed following labs and imaging studies  CBC: Recent Labs  Lab 09/15/20 1218 09/19/20 0512  WBC 7.4 8.1  NEUTROABS  --  4.2  HGB 11.6* 11.8*  HCT 35.7* 36.7  MCV 88.1 90.2  PLT 258 274   Basic Metabolic Panel: Recent Labs  Lab 09/17/20 0307 09/18/20 1203 09/19/20 0512 09/20/20 0905 09/21/20 0737  NA 138 136 138 139 139  K 3.2* 3.4* 3.7 3.8 3.9  CL 104 105 108 106 106  CO2 26 22 22 23 25   GLUCOSE 108* 124* 109* 129* 113*  BUN 10 9 10 15 14   CREATININE 0.74  0.75 0.77 0.82 0.78  CALCIUM 8.8* 8.4* 8.9 9.1 8.9  MG  --   --  2.1  --   --    Recent Results (from the past 240 hour(s))  SARS CORONAVIRUS 2 (TAT 6-24 HRS) Nasopharyngeal Nasopharyngeal Swab     Status: None   Collection Time: 09/15/20  9:01 PM   Specimen: Nasopharyngeal Swab  Result Value Ref Range Status   SARS Coronavirus 2 NEGATIVE NEGATIVE Final    Comment: (NOTE) SARS-CoV-2 target nucleic acids are NOT DETECTED.  The SARS-CoV-2 RNA is generally detectable in upper and lower respiratory specimens during the acute phase of infection. Negative results do not preclude SARS-CoV-2 infection, do not rule out co-infections with other pathogens, and should not be used as the sole basis for treatment or other patient management decisions. Negative results must be combined with clinical  observations, patient history, and epidemiological information. The expected result is Negative.  Fact Sheet for Patients: HairSlick.no  Fact Sheet for Healthcare Providers: quierodirigir.com  This test is not yet approved or cleared by the [place] FDA and  has been authorized for detection and/or diagnosis of SARS-CoV-2 by FDA under an Emergency Use Authorization (EUA). This EUA will remain  in effect (meaning this test can be used) for the duration of the COVID-19 declaration under Se ction 564(b)(1) of the Act, 21 U.S.C. section 360bbb-3(b)(1), unless the authorization is terminated or revoked sooner.  Performed at [facility], [address] [place], [place] [address]   Culture, Urine     Status: None   Collection Time: 09/16/20  1:27 PM   Specimen: Urine, Clean Catch  Result Value Ref Range Status   Specimen Description URINE, CLEAN CATCH  Final   Special Requests NONE  Final   Culture   Final    NO GROWTH Performed at [facility], [address], [place], [place] [address]    Report Status 09/17/2020 FINAL  Final      Radiology Studies: No results found.  Scheduled Meds:  amLODipine  10 mg Oral Daily   amoxicillin-clavulanate  1 tablet Oral Q12H   atorvastatin  40 mg Oral Daily   clopidogrel  75 mg Oral Daily   enoxaparin (LOVENOX) injection  40 mg Subcutaneous Q24H   fluconazole  100 mg Oral Daily   fluticasone  1 spray Each Nare Daily   hydrALAZINE  100 mg Oral Q8H   isosorbide dinitrate  20 mg Oral TID   losartan  100 mg Oral Daily   nebivolol  2.5 mg Oral q morning   omeprazole  20 mg Oral Daily   ranolazine  1,000 mg Oral BID   sodium chloride flush  3 mL Intravenous Q12H   sucralfate  1 g Oral BID   Continuous Infusions:  sodium chloride     sodium chloride 1 mL/kg/hr (09/16/20 1100)   promethazine (PHENERGAN) injection (IM or IVPB) 6.25 mg (09/17/20 2323)     LOS: 5 days    Time spent: 25 minutes.  Tyrone Nine, MD [facility] [email or web address] 09/21/2020, 4:09 PM

## 2020-09-21 NOTE — Consult Note (Signed)
Interventional Cardiology Opinion Asked to consider PCR of the mid to distal LAD. Angiography from 09/16/2020 was reviewed. The LAD is tortuous with high-grade eccentric LAD stenosis forming a [place] 1, 1, 1 stenosis.  Small to moderate territory and risk.  The lesion is within an acutely angulated segment.  The vessel beyond the tight stenosis is diffusely diseased and tortuous. RECOMMENDATIONS: PCI will likely be complicated by an ischemic event due to either to coronary dissection and or sidebranch occlusion.  Tortuosity may make it difficult to even cross the stenosis.  PCI would likely cause more harm than good.  Recommend medical therapy.  Consider increasing beta-blocker and decreasing hydralazine as a means to get more anti-ischemic protection.  Would also recommend maximizing ranolazine therapy. Long-term, hopefully the patient will develop collateralization and decreasing symptoms over time.

## 2020-09-21 NOTE — Progress Notes (Addendum)
Progress Note  Patient Name: Jennifer [name] Date of Encounter: 09/21/2020  Primary Cardiologist: Christell Constant, MD   Subjective   Continues to have multiple complaints this AM. Reports a jittery feeling at time, feverish overnight and chest burning this AM. Reports chest pain with PT yesterday, relieved with SL NTG.   Inpatient Medications    Scheduled Meds:  amLODipine  10 mg Oral Daily   amoxicillin-clavulanate  1 tablet Oral Q12H   atorvastatin  40 mg Oral Daily   clopidogrel  75 mg Oral Daily   enoxaparin (LOVENOX) injection  40 mg Subcutaneous Q24H   fluticasone  1 spray Each Nare Daily   hydrALAZINE  100 mg Oral Q8H   isosorbide dinitrate  20 mg Oral TID   losartan  100 mg Oral Daily   nebivolol  2.5 mg Oral q morning   pantoprazole  40 mg Oral Daily   ranolazine  500 mg Oral BID   sodium chloride flush  3 mL Intravenous Q12H   sucralfate  1 g Oral BID   Continuous Infusions:  sodium chloride     sodium chloride 1 mL/kg/hr (09/16/20 1100)   promethazine (PHENERGAN) injection (IM or IVPB) 6.25 mg (09/17/20 2323)   PRN Meds: sodium chloride, acetaminophen, hydrALAZINE, HYDROcodone-acetaminophen, morphine injection, nitroGLYCERIN, promethazine (PHENERGAN) injection (IM or IVPB), sodium chloride flush   Vital Signs    Vitals:   09/20/20 1238 09/20/20 1503 09/20/20 2126 09/21/20 0612  BP: (!) 108/51 109/60 131/70 (!) 151/72  Pulse: (!) 58  67 70  Resp: 18  17 18   Temp: 98.2 F (36.8 C)  98 F (36.7 C) 98.4 F (36.9 C)  TempSrc: Oral  Oral Oral  SpO2:   99% 99%  Weight:      Height:        Intake/Output Summary (Last 24 hours) at 09/21/2020 09/23/2020 Last data filed at 09/21/2020 0600 Gross per 24 hour  Intake 3 ml  Output 900 ml  Net -897 ml   Filed Weights   09/15/20 1226 09/16/20 0142  Weight: 91.2 kg 90.3 kg    Physical Exam   General: Well developed, well nourished, NAD Neck: Negative for carotid bruits. No JVD Lungs:Clear to  ausculation bilaterally. No wheezes, rales, or rhonchi. Breathing is unlabored. Cardiovascular: RRR with S1 S2. + murmur Abdomen: Soft, non-tender, non-distended. No obvious abdominal masses. Extremities: No edema. Neuro: Alert and oriented. No focal deficits. No facial asymmetry. MAE spontaneously. Psych: Responds to questions appropriately with normal affect.   Labs    Chemistry Recent Labs  Lab 09/18/20 1203 09/19/20 0512 09/20/20 0905  NA 136 138 139  K 3.4* 3.7 3.8  CL 105 108 106  CO2 22 22 23   GLUCOSE 124* 109* 129*  BUN 9 10 15   CREATININE 0.75 0.77 0.82  CALCIUM 8.4* 8.9 9.1  GFRNONAA >60 >60 >60  ANIONGAP 9 8 10      Hematology Recent Labs  Lab 09/15/20 1218 09/19/20 0512  WBC 7.4 8.1  RBC 4.05 4.07  HGB 11.6* 11.8*  HCT 35.7* 36.7  MCV 88.1 90.2  MCH 28.6 29.0  MCHC 32.5 32.2  RDW 12.9 13.5  PLT 258 274    Cardiac EnzymesNo results for input(s): TROPONINI in the last 168 hours. No results for input(s): TROPIPOC in the last 168 hours.   BNPNo results for input(s): BNP, PROBNP in the last 168 hours.   DDimer No results for input(s): DDIMER in the last 168 hours.  Radiology    No results found.  Telemetry    09/21/20 NSR 70's  Personally Reviewed  ECG    No new tracing as of 09/21/20 - Personally Reviewed  Cardiac Studies   LHC 09/16/20:   Mid LAD lesion is 50% stenosed. 3rd Diag lesion is 60% stenosed. Dist LAD-1 lesion is 90% stenosed. Dist LAD-2 lesion is 40% stenosed.   1.  Significant one-vessel coronary artery disease involving the mid to distal LAD at the bifurcation of the third diagonal which has also 60% ostial stenosis.  The coronary arteries are very tortuous suggestive of hypertensive heart disease. 2.  Left ventricular angiography was not performed.  EF was normal by echo. 3.  Severely elevated blood pressure with mildly elevated left ventricular end-diastolic pressure. 4.  Transient left bundle branch block after engaging  the left ventricle.  This resolved by the end of the case. 5.  Abdominal aortogram was performed and showed no evidence of renal artery stenosis.   Recommendations: Unfortunately, the LAD stenosis is in a very tortuous segment and at the origin of the third diagonal branch which also has ostial disease.  PCI in this area will be high risk for dissection as well as sidebranch closure especially in the setting of uncontrolled hypertension.  In addition, the distal LAD after the stenosis is diffusely diseased. I recommend maximizing medical therapy and controlling blood pressure before considering PCI.  I added amlodipine to her antihypertensive medications. Other complicating issue is that the patient cannot tolerate aspirin long-term.  If we decide to proceed with PCI at some point, we will have to make sure she tolerates ticagrelor which can likely be used as monotherapy.      Diagnostic Dominance: Right      Intervention       Echocardiogram 09/15/20:   1. Left ventricular ejection fraction, by estimation, is 60 to 65%. The  left ventricle has normal function. The left ventricle has no regional  wall motion abnormalities. There is moderate left ventricular hypertrophy.  Left ventricular diastolic  parameters are consistent with Grade I diastolic dysfunction (impaired  relaxation). Elevated left atrial pressure. The average left ventricular  global longitudinal strain is -20.4 %. The global longitudinal strain is  normal.   2. Right ventricular systolic function is normal. The right ventricular  size is normal.   3. The mitral valve is normal in structure. Trivial mitral valve  regurgitation. No evidence of mitral stenosis.   4. The aortic valve is tricuspid. Aortic valve regurgitation is not  visualized. Mild aortic valve stenosis.   5. Aortic dilatation noted. There is mild dilatation of the ascending  aorta, measuring 43 mm.   6. The inferior vena cava is normal in size with  greater than 50%  respiratory variability, suggesting right atrial pressure of 3 mmHg.   Comparison(s): 07/11/17 EF 60-65%. PA pressure .  Patient Profile     75 y.o. female with a hx of hypertension, PVC, HLD, chronic pain syndrome with lumbar stenosis was admitted after she went to the office w/ CP and SBP 260 >> sent to ER >> cardiac CT w/ LAD stenosis >> admitted for cath.  Assessment & Plan   Chest pain: -CCTA with significant LAD disease>>subsequent LHC with patent LM, mid LAD 50%, distal LAD 90%, D3 60%. Cath noted LAD lesion with a very tortuous segment at branching point of D3, high risk for dissection vs side branch closure, and the distal LAD is diffusely diseased limiting benefit. Recommendations were  for medical management and could consider PCI if recurrent chest pain on optimal anti anginal therapy. -Limited tolerance to ASA, continue Plavix  -Isordil 10 TID added along with Ranexa>>initial plan was to consolidate to Imdur>>>however given allergies and medication sensitivities her Isordil was up titrated to 20 mg 3 times daily given continued exertional chest pain -She is allergic to yellow dye and it appears the Isordil 20 mg tablets may contain this>>plan for 10 mg tablets but she can take 2 tablets for a total dose of 20 mg 3 times daily>>clarified with pharmacy  -Continues to have exertional chest pain>>>may want to consider cardiac catheterization later this week with attempt at intervention if the patient is agreeable.   2. HTN: -Remains labile, but more controlled than admission pressure -151/72>>131/70>>106/60 -Continue amlodipine 10, hydralazine 100, losartan 100, bystolic 2.5 -Ranexa added at 423 BID    3. Mild AS: -Echo this admission with mild AS -Continue statin   4. Aortic root dilatation: -62mm, ascending aorta -Will continue with serial imagining in the OP setting -Needs better OP HTN control    Signed, Georgie Chard NP-C [facility] Pager:  [phone] 09/21/2020, 6:38 AM     For questions or updates, please contact   Please consult [email or web address] for contact info under Cardiology/STEMI.  Patient seen and examined with Georgie Chard, NP-C.  Agree as above, with the following exceptions and changes as noted below.  Patient continues to have burning chest discomfort of unclear origin.  Suspect there may be a component of endothelial dysfunction, antianginal therapy is being maximized including amlodipine and Isordil for the benefit of antianginal as well as possible microvascular dysfunction. Gen: NAD, CV: RRR, no murmurs, Lungs: clear, Abd: soft, Extrem: Warm, well perfused, no edema, Neuro/Psych: alert and oriented x 3, normal mood and affect. All available labs, radiology testing, previous records reviewed.  I have reviewed the case with Dr. Katrinka Blazing and interventional cardiology.  There may be more risk than benefit with pursuing intervention of her coronary artery disease, and both the initial cath as well as Dr. Katrinka Blazing review recommend maximizing medical therapy is much as possible and pursuing cath only in the setting of ACS if possible.  She has had bradycardia with increased doses of nebivolol, and now blood pressure is relatively well controlled.  For further symptom benefit we will increase the dose of ranolazine to 1000 mg twice daily.  Patient has many medication intolerances and we may struggle with medication management due to this effect.  I discussed her case in detail with Dr. Jarvis Newcomer from the primary service.  We may struggle with making her completely chest pain-free, but if symptoms improve with sublingual nitroglycerin, current antianginal regimen and increase of ranolazine, could consider hospital discharge in 1 to 2 days if patient is agreeable.  Very difficult situation.  Parke Poisson, MD 09/21/20 12:13 PM

## 2020-09-21 NOTE — Progress Notes (Signed)
Physical Therapy Treatment Patient Details Name: Jennifer [name] MRN: [id number] DOB: 1946-03-03 Today's Date: 09/21/2020    History of Present Illness Pt is a 75 y.o. female admitted 09/15/20 with c/o chest pain. Workup for hypertensive urgency. Workup revealed significant stenosis in LAD, not amenable to PCI. PMH includes HTN, HLD, insterstitial cystitis requiring intermittent self-cath, lumbar stenosis, chronic pain, fibromyalgia.    PT Comments    Pt with gradual progress.  She reports having a bad night so did not want to attempt ambulation in halls or stairs (she has 3 flights to enter her apt).  Additionally, BP significantly elevates with activity (191/61) but back to 131/56 with rest.  Will continue to benefit from PT to further address back pain as able while in acute care and progression of activity.  Pt reports she may have a procedure tomorrow.    Follow Up Recommendations  Outpatient PT (for back pain)     Equipment Recommendations  None recommended by PT    Recommendations for Other Services       Precautions / Restrictions Precautions Precautions: Fall Precaution Comments: watch BP    Mobility  Bed Mobility               General bed mobility comments: received sitting EOB    Transfers Overall transfer level: Independent Equipment used: None             General transfer comment: Has been transferring/ambulating in room independently  Ambulation/Gait Ambulation/Gait assistance: Supervision Gait Distance (Feet): 150 Feet Assistive device: None Gait Pattern/deviations: Step-through pattern;Decreased stride length;Trunk flexed Gait velocity: Decreased   General Gait Details: Pt declined hallway ambulation due to feeling weak, heart racing, elevated BP.  Also, reports had a bad night-urinating frequently. Cues for upright posture as able. Monitored vitals   Stairs             Wheelchair Mobility    Modified Rankin (Stroke Patients  Only)       Balance Overall balance assessment: No apparent balance deficits (not formally assessed)   Sitting balance-Leahy Scale: Good       Standing balance-Leahy Scale: Good                              Cognition Arousal/Alertness: Awake/alert Behavior During Therapy: WFL for tasks assessed/performed Overall Cognitive Status: Within Functional Limits for tasks assessed                                 General Comments: WFL for majority of simple tasks; pt verbose in conversation and quick to switch subjects requiring intermittent redirection to current topic/task      Exercises      General Comments General comments (skin integrity, edema, etc.): BP pre 149/65, Post walk 191/61, post walk 5 mins 131/56.  Increased time spent discussing pt's low back pain.  She reports long hx of lumbar stenosis and pain management clinic but has not tried therapy.  Reports relieved in sidelying with pillow between knees or back with pillow under knees but has to change positions frequently.  Increased in prone.  Reports hx of chronic pain, chronic fatigue syndrome, fibromyalgia, and having high pain tolerance.  Reports multiple treatments tried with pain management including nerve blocks and ablations.  Reports pain at times radiates down R leg more than left.  Pt educated on body mechanics with transfers and  recommendation for outpt PT to further assess and treat. Discussed could potentially benefit from light core exercises; however, pt's main concern at this time is her cardiac condition.  Will address as able.      Pertinent Vitals/Pain Pain Assessment: Faces Faces Pain Scale: Hurts little more Pain Location: Lower back into BLEs (RLE > LLE)-chronic Pain Descriptors / Indicators: Discomfort;Guarding Pain Intervention(s): Limited activity within patient's tolerance;Monitored during session;Repositioned    Home Living                      Prior Function             PT Goals (current goals can now be found in the care plan section) Acute Rehab PT Goals Patient Stated Goal: Decreased back pain, be able to socialize more PT Goal Formulation: With patient Time For Goal Achievement: 10/04/20 Potential to Achieve Goals: Good Progress towards PT goals: Progressing toward goals    Frequency    Min 3X/week      PT Plan Current plan remains appropriate    Co-evaluation              AM-PAC PT "6 Clicks" Mobility   Outcome Measure  Help needed turning from your back to your side while in a flat bed without using bedrails?: None Help needed moving from lying on your back to sitting on the side of a flat bed without using bedrails?: None Help needed moving to and from a bed to a chair (including a wheelchair)?: None Help needed standing up from a chair using your arms (e.g., wheelchair or bedside chair)?: None Help needed to walk in hospital room?: None Help needed climbing 3-5 steps with a railing? : A Little 6 Click Score: 23    End of Session   Activity Tolerance: Patient tolerated treatment well Patient left: in chair;with call bell/phone within reach Nurse Communication: Mobility status PT Visit Diagnosis: Other abnormalities of gait and mobility (R26.89);Pain Pain - part of body:  (back)     Time: 6945-0388 PT Time Calculation (min) (ACUTE ONLY): 30 min  Charges:  $Gait Training: 8-22 mins $Therapeutic Activity: 8-22 mins                     Anise Salvo, PT Acute Rehab Services Pager [phone] [facility] [phone]    Rayetta Humphrey 09/21/2020, 2:20 PM

## 2020-09-22 ENCOUNTER — Other Ambulatory Visit: Payer: Self-pay

## 2020-09-22 ENCOUNTER — Ambulatory Visit: Payer: Medicare Other | Admitting: Psychiatry

## 2020-09-22 ENCOUNTER — Encounter (HOSPITAL_COMMUNITY)
Admission: EM | Disposition: A | Discharge status: Home / Self Care | Payer: Self-pay | Source: Home / Self Care | Attending: Family Medicine

## 2020-09-22 SURGERY — CORONARY STENT INTERVENTION
Anesthesia: LOCAL

## 2020-09-22 MED ORDER — DOXAZOSIN MESYLATE 1 MG PO TABS
1.0000 mg | ORAL_TABLET | Freq: Every day | ORAL | Status: DC
  Administered 2020-09-22 – 2020-09-27 (×6): 1 mg via ORAL
  Filled 2020-09-22 (×6): qty 1

## 2020-09-22 MED ORDER — LIDOCAINE VISCOUS HCL 2 % MT SOLN
15.0000 mL | Freq: Once | OROMUCOSAL | Status: AC
  Administered 2020-09-22: 15 mL via ORAL
  Filled 2020-09-22: qty 15

## 2020-09-22 MED ORDER — OMEPRAZOLE 20 MG PO CPDR
40.0000 mg | DELAYED_RELEASE_CAPSULE | Freq: Two times a day (BID) | ORAL | Status: DC
  Administered 2020-09-22 – 2020-09-27 (×10): 40 mg via ORAL
  Filled 2020-09-22 (×11): qty 2

## 2020-09-22 MED ORDER — OMEPRAZOLE 20 MG PO CPDR
20.0000 mg | DELAYED_RELEASE_CAPSULE | Freq: Once | ORAL | Status: AC
  Administered 2020-09-22: 20 mg via ORAL
  Filled 2020-09-22: qty 1

## 2020-09-22 MED ORDER — RANOLAZINE ER 500 MG PO TB12
500.0000 mg | ORAL_TABLET | Freq: Two times a day (BID) | ORAL | Status: DC
  Administered 2020-09-22 – 2020-09-27 (×10): 500 mg via ORAL
  Filled 2020-09-22 (×10): qty 1

## 2020-09-22 MED ORDER — ALUM & MAG HYDROXIDE-SIMETH 200-200-20 MG/5ML PO SUSP
30.0000 mL | Freq: Four times a day (QID) | ORAL | Status: DC | PRN
  Administered 2020-09-22 – 2020-09-25 (×5): 30 mL via ORAL
  Filled 2020-09-22 (×4): qty 30

## 2020-09-22 MED ORDER — ALUM & MAG HYDROXIDE-SIMETH 200-200-20 MG/5ML PO SUSP
30.0000 mL | Freq: Once | ORAL | Status: DC
  Filled 2020-09-22: qty 30

## 2020-09-22 MED ORDER — SPIRONOLACTONE 12.5 MG HALF TABLET
12.5000 mg | ORAL_TABLET | Freq: Every day | ORAL | Status: DC
  Administered 2020-09-22 – 2020-09-23 (×2): 12.5 mg via ORAL
  Filled 2020-09-22 (×2): qty 1

## 2020-09-22 MED ORDER — LIDOCAINE VISCOUS HCL 2 % MT SOLN
15.0000 mL | Freq: Once | OROMUCOSAL | Status: AC
  Administered 2020-09-23: 15 mL via ORAL
  Filled 2020-09-22: qty 15

## 2020-09-22 NOTE — Progress Notes (Addendum)
Progress Note  Patient Name: Jennifer [name] Date of Encounter: 09/22/2020  Primary Cardiologist: Christell Constant, MD   Subjective   C/o upper L chest burning and pressure, some before am meds but got much worse afterwards. Lying flat Had CP after walking when SBP >190 Feels bad when BP close to normal  Inpatient Medications    Scheduled Meds:  amLODipine  10 mg Oral Daily   amoxicillin-clavulanate  1 tablet Oral Q12H   atorvastatin  40 mg Oral Daily   clopidogrel  75 mg Oral Daily   enoxaparin (LOVENOX) injection  40 mg Subcutaneous Q24H   fluconazole  100 mg Oral Daily   fluticasone  1 spray Each Nare Daily   hydrALAZINE  100 mg Oral Q8H   isosorbide dinitrate  20 mg Oral TID   losartan  100 mg Oral Daily   nebivolol  2.5 mg Oral q morning   omeprazole  20 mg Oral Daily   ranolazine  1,000 mg Oral BID   sodium chloride flush  3 mL Intravenous Q12H   sucralfate  1 g Oral BID   Continuous Infusions:  sodium chloride     sodium chloride 1 mL/kg/hr (09/16/20 1100)   promethazine (PHENERGAN) injection (IM or IVPB) 6.25 mg (09/17/20 2323)   PRN Meds: sodium chloride, acetaminophen, hydrALAZINE, HYDROcodone-acetaminophen, morphine injection, nitroGLYCERIN, promethazine (PHENERGAN) injection (IM or IVPB), sodium chloride flush   Vital Signs    Vitals:   09/21/20 1100 09/21/20 1412 09/21/20 2133 09/22/20 0538  BP: 133/72 (!) 118/52 (!) 160/62 (!) 155/72  Pulse: 67  72 65  Resp: 18  18 18   Temp: 98.2 F (36.8 C)  98.8 F (37.1 C) 98.8 F (37.1 C)  TempSrc: Oral  Oral Oral  SpO2:   99% 99%  Weight:      Height:       No intake or output data in the 24 hours ending 09/22/20 0756  Filed Weights   09/15/20 1226 09/16/20 0142  Weight: 91.2 kg 90.3 kg    Physical Exam   General: Well developed, well nourished, female appears uncomfortable Head: Eyes PERRLA, Head normocephalic and atraumatic Lungs: clear bilaterally to auscultation. Heart: HRRR S1  S2, without rub or gallop. 2/6 murmur. 4/4 extremity pulses are 2+ & equal. No JVD. Abdomen: Bowel sounds are present, abdomen soft and non-tender without masses or  hernias noted. Msk: Normal strength and tone for age. Extremities: No clubbing, cyanosis or edema.    Skin:  No rashes or lesions noted. Neuro: Alert and oriented X 3. Psych:  Good affect, responds appropriately   Labs    Chemistry Recent Labs  Lab 09/19/20 0512 09/20/20 0905 09/21/20 0737  NA 138 139 139  K 3.7 3.8 3.9  CL 108 106 106  CO2 22 23 25   GLUCOSE 109* 129* 113*  BUN 10 15 14   CREATININE 0.77 0.82 0.78  CALCIUM 8.9 9.1 8.9  GFRNONAA >60 >60 >60  ANIONGAP 8 10 8      Hematology Recent Labs  Lab 09/15/20 1218 09/19/20 0512  WBC 7.4 8.1  RBC 4.05 4.07  HGB 11.6* 11.8*  HCT 35.7* 36.7  MCV 88.1 90.2  MCH 28.6 29.0  MCHC 32.5 32.2  RDW 12.9 13.5  PLT 258 274   Troponin I (High Sensitivity)  Date Value Ref Range Status  09/16/2020 12 <18 ng/L Final    Comment:    (NOTE) Elevated high sensitivity troponin I (hsTnI) values and significant  changes  across serial measurements may suggest ACS but many other  chronic and acute conditions are known to elevate hsTnI results.  Refer to the "Links" section for chest pain algorithms and additional  guidance. Performed at [facility], [address] [place], [place] [address]   09/15/2020 13 <18 ng/L Final    Comment:    (NOTE) Elevated high sensitivity troponin I (hsTnI) values and significant  changes across serial measurements may suggest ACS but many other  chronic and acute conditions are known to elevate hsTnI results.  Refer to the "Links" section for chest pain algorithms and additional  guidance. Performed at [facility], [address], [place], [place] [address]   09/15/2020 16 <18 ng/L Final    Comment:    (NOTE) Elevated high sensitivity troponin I (hsTnI) values and significant  changes across serial  measurements may suggest ACS but many other  chronic and acute conditions are known to elevate hsTnI results.  Refer to the "Links" section for chest pain algorithms and additional  guidance. Performed at [facility], [address], [place], [place] [address]    No results found for: HGBA1C Lab Results  Component Value Date   TSH 3.053 09/16/2020   No results found for: CHOL, HDL, LDLCALC, LDLDIRECT, TRIG, CHOLHDL   BNPNo results for input(s): BNP, PROBNP in the last 168 hours.   DDimer No results for input(s): DDIMER in the last 168 hours.   Radiology    No results found.  Telemetry    SR, sinus brady to high 40s while asleep  Personally Reviewed  ECG    No new tracing as of 09/21/20 - Personally Reviewed  Cardiac Studies   LHC 09/16/20:   Mid LAD lesion is 50% stenosed. 3rd Diag lesion is 60% stenosed. Dist LAD-1 lesion is 90% stenosed. Dist LAD-2 lesion is 40% stenosed.   1.  Significant one-vessel coronary artery disease involving the mid to distal LAD at the bifurcation of the third diagonal which has also 60% ostial stenosis.  The coronary arteries are very tortuous suggestive of hypertensive heart disease. 2.  Left ventricular angiography was not performed.  EF was normal by echo. 3.  Severely elevated blood pressure with mildly elevated left ventricular end-diastolic pressure. 4.  Transient left bundle branch block after engaging the left ventricle.  This resolved by the end of the case. 5.  Abdominal aortogram was performed and showed no evidence of renal artery stenosis.   Recommendations: Unfortunately, the LAD stenosis is in a very tortuous segment and at the origin of the third diagonal branch which also has ostial disease.  PCI in this area will be high risk for dissection as well as sidebranch closure especially in the setting of uncontrolled hypertension.  In addition, the distal LAD after the stenosis is diffusely diseased. I recommend  maximizing medical therapy and controlling blood pressure before considering PCI.  I added amlodipine to her antihypertensive medications. Other complicating issue is that the patient cannot tolerate aspirin long-term.  If we decide to proceed with PCI at some point, we will have to make sure she tolerates ticagrelor which can likely be used as monotherapy.      Diagnostic Dominance: Right      Intervention       Echocardiogram 09/15/20:   1. Left ventricular ejection fraction, by estimation, is 60 to 65%. The  left ventricle has normal function. The left ventricle has no regional  wall motion abnormalities. There is  moderate left ventricular hypertrophy.  Left ventricular diastolic  parameters are consistent with Grade I diastolic dysfunction (impaired  relaxation). Elevated left atrial pressure. The average left ventricular  global longitudinal strain is -20.4 %. The global longitudinal strain is  normal.   2. Right ventricular systolic function is normal. The right ventricular  size is normal.   3. The mitral valve is normal in structure. Trivial mitral valve  regurgitation. No evidence of mitral stenosis.   4. The aortic valve is tricuspid. Aortic valve regurgitation is not  visualized. Mild aortic valve stenosis.   5. Aortic dilatation noted. There is mild dilatation of the ascending  aorta, measuring 43 mm.   6. The inferior vena cava is normal in size with greater than 50%  respiratory variability, suggesting right atrial pressure of 3 mmHg.   Comparison(s): 07/11/17 EF 60-65%. PA pressure 33mmHg.  Patient Profile     75 y.o. female with a hx of hypertension, PVC, HLD, chronic pain syndrome with lumbar stenosis was admitted after she went to the office w/ CP and SBP 260 >> sent to ER >> cardiac CT w/ LAD stenosis >> admitted for cath.  Assessment & Plan   Chest pain: - cath report above - Dr Katrinka BlazingSmith reviewed films and advised risks > benefits of PCI due to likelihood of  dissection or sidebranch occlusion>> medical therapy - reviewed this w/ pt - intol ASA, on Plavix - intol isosorbide, on isordil (use 10 mg tabs regardless of dose) and Ranexa - continue trying to adjust meds, on isordil 20 mg tid, ranexa - will decrease back to 500 mg bid due to med interactions and nevibolol 2.5 mg qd (no increase w/ bradycardia)   2. HTN: - BP labile w/ SBP range 112-160 last 24 hr, DBP 48-72 - pta on losartan 100 mg qd (on here), nevibolol 5 mg (2.5 mg here) - New Rx: amlodipine 10 mg, hydralazine 100 mg tid, isordil 20 mg tid  - discuss w/ pharmacist which meds are most likely causing her to feel bad (?hydralazine) and how to mitigate 80 pt range in her SBP   3. Mild AS: - seen on echo this admit - needs BP control   4. Aortic root dilatation: - Asc Aorta 43 mm on echo - follow as outpt - BP control is important  For questions or updates, please contact   Please consult [email or web address] for contact info under Cardiology/STEMI.  Theodore DemarkRhonda Barrett, PA-C 09/22/2020 9:25 AM  Patient seen and examined with Theodore Demarkhonda Barrett, PA-C.  Agree as above, with the following exceptions and changes as noted below.  Patient continues to feel poorly and has resting chest pressure despite medication titrations.  This chest pressure usually responds to nitroglycerin and we will offer her this now.  She feels perhaps her burning chest discomfort started after her a.m. pills and wonders if it is GI in origin. Gen: NAD, CV: RRR, no murmurs, Lungs: clear, Abd: soft, Extrem: Warm, well perfused, no edema, Neuro/Psych: alert and oriented x 3, normal mood and affect. All available labs, radiology testing, previous records reviewed.  Her blood pressure has been far better managed, but she continues to have some ambulatory significant hypertension.  Discussed with the pharmacist, we will begin low-dose alpha blockade with doxazosin and MRA with spironolactone 12.5 mg daily.  Will reduce the dose of  Ranexa due to medication interaction with fluconazole.  Continue other antihypertensive therapy.  If blood pressure appears stable on this dose in next 24 hours particularly  with ambulation, would recommend further medication titrations as an outpatient.  Difficult situation given inability to intervene on CAD due to high risk procedure, however her pain is able to be relieved with nitroglycerin and she may do well with sublingual nitroglycerin as needed at home.  We will try to arrange close outpatient follow-up with cardiology so that medication titration can continue.  She is tearful in the room today.  Parke Poisson, MD 09/22/20 10:37 AM

## 2020-09-22 NOTE — Progress Notes (Signed)
PT Cancellation Note  Patient Details Name: Jennifer [name] MRN: [id number] DOB: April 26, 1945   Cancelled Treatment:    Reason Eval/Treat Not Completed: Patient declined, no reason specified. PT attempted to see patient twice on this date. Initially pt eating breakfast, reporting pain low back pain and requesting PT return later in the afternoon. Upon PT return the pt refuses therapy again, reporting a flare up of her chest discomfort today. RN also reports recent administration of nitroglycerin. PT will attempt to follow up as time allows.   Arlyss Gandy 09/22/2020, 2:53 PM

## 2020-09-22 NOTE — Progress Notes (Signed)
PROGRESS NOTE  Jennifer [name]  IWP:[id number] DOB: 10-30-1945 DOA: 09/15/2020 PCP: [facility]   Brief Narrative: Jennifer [name] is a 75 y.o. female with a history of resistant HTN, HLD, GERD, interstitial cystitis requiring intermittent self-catheterization, lumbar stenosis causing chronic pain who was sent to the ED by cardiology due to chest discomfort and HTN urgency on 6/16.  Assessment & Plan: Principal Problem:   Hypertensive urgency Active Problems:   GERD (gastroesophageal reflux disease)   Chest pain   Interstitial cystitis (chronic) without hematuria   Chronic low back pain   Chronic diastolic CHF (congestive heart failure) (HCC)   Unstable angina (HCC)   Hypertensive crisis  Hypertensive emergency, resistant HTN: SBP up to 260 with chest [facility] at [facility]  - Abdominal aortogram at time of cath did not reveal renal artery stenosis.  - Aldo + renin w/ratio lab checked 6/19 (still pending). - Other symptoms not consistent with pheochromocytoma, though dedicated work up could be pursued as an outpatient.  - Appreciate cardiology recommendations: Will continue treatment with hopes of continue improvement and avoidance of interventions. Isordil, ranexa, bystolic, norvasc, losartan on board. BP improved though still exertionally severely hypertensive. Adding alpha blocker per cards today. - Ranexa dose limited by fluconazole interaction. - Starting spironolactone today, monitor Cr and K in AM.  CAD with chest pain: CT FFR revealed significant functional stenosis in LAD.  - Continue BP control as above - Continue statin, beta blocker - Lesion not amenable to PCI (tortuosity, extensive disease distal to stenosis). Multiple opinions sought with consistent fear that risk of cardiac event (coronary dissection and/or occlusion of sidebranch) is much more likely than any benefit that may be derived from attempted PCI. Hopeful for optimization of medical  therapy and patient observation for development of collaterals. - Continue NTG prn as BP will tolerate. Reports abnormal symptoms that may be exacerbated by anxiety, though with broad scope of medication intolerances, we will not initiate pharmacotherapy at this time.   Chronic HFpEF: LVEF 60-65%, no WMA, mod LVH, G1DD on echo. Appears stable volumewise.  - No diuretic needed currently  GERD: ?if this is contributing to ongoing chest complaints, particularly atypical features.  - Will consult GI, pt of Dr. Buccini's to inform decision of needing EGD as inpatient vs. outpatient and ongoing medication Tx for reflux. I appreciate their help.  - Continue PPI, carafate.  Lumbar stenosis, chronic pain:  - Continue home medications (norco)  Chronic interstitial cystitis:  - No changes to management currently.  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  Cutaneous candidiasis:  - Continue fluconazole  Obesity: Estimated body mass index is 33.12 kg/m as calculated from the following:   Height as of this encounter: 5\' 5"  (1.651 m).   Weight as of this encounter: 90.3 kg.  DVT prophylaxis: Lovenox Code Status: Full Family Communication: None at bedside Disposition Plan:  Status is: Inpatient  Remains inpatient appropriate because:Ongoing diagnostic testing needed not appropriate for outpatient work up and Inpatient level of care appropriate due to severity of illness  Dispo: The patient is from: Home              Anticipated d/c is to: Home              Patient currently is not medically stable to d/c.   Difficult to place patient No  Consultants:  Cardiology  Procedures:  LHC 09/16/20:  Mid LAD lesion is 50% stenosed. 3rd Diag lesion is 60% stenosed. Dist LAD-1 lesion is 90% stenosed. Dist LAD-2 lesion is 40% stenosed.  1.  Significant one-vessel coronary artery disease involving the mid to distal LAD at the bifurcation  of the third diagonal which has also 60% ostial stenosis.  The coronary arteries are very tortuous suggestive of hypertensive heart disease. 2.  Left ventricular angiography was not performed.  EF was normal by echo. 3.  Severely elevated blood pressure with mildly elevated left ventricular end-diastolic pressure. 4.  Transient left bundle branch block after engaging the left ventricle.  This resolved by the end of the case. 5.  Abdominal aortogram was performed and showed no evidence of renal artery stenosis.  Recommendations: Unfortunately, the LAD stenosis is in a very tortuous segment and at the origin of the third diagonal branch which also has ostial disease.  PCI in this area will be high risk for dissection as well as sidebranch closure especially in the setting of uncontrolled hypertension.  In addition, the distal LAD after the stenosis is diffusely diseased. I recommend maximizing medical therapy and controlling blood pressure before considering PCI.  I added amlodipine to her antihypertensive medications. Other complicating issue is that the patient cannot tolerate aspirin long-term.  If we decide to proceed with PCI at some point, we will have to make sure she tolerates ticagrelor which can likely be used as monotherapy.   Antimicrobials: None   Subjective: "Having a bad day" with pain in the upper chest somewhat improved by nitro and by GI cocktail. Very nervous about all the medications we're using to help with BP but more worried about how the BP is high when she walks.   Objective: Vitals:   09/21/20 1412 09/21/20 2133 09/22/20 0538 09/22/20 1242  BP: (!) 118/52 (!) 160/62 (!) 155/72 (!) 136/58  Pulse:  72 65   Resp:  18 18 18   Temp:  98.8 F (37.1 C) 98.8 F (37.1 C) 98.5 F (36.9 C)  TempSrc:  Oral Oral Oral  SpO2:  99% 99%   Weight:      Height:       No intake or output data in the 24 hours ending 09/22/20 1349  Filed Weights   09/15/20 1226 09/16/20 0142   Weight: 91.2 kg 90.3 kg   Gen: 75 y.o. female in no distress Pulm: Nonlabored breathing room air. Clear. CV: Regular rate and rhythm. No murmur, rub, or gallop. No JVD, no dependent edema. GI: Abdomen soft, non-tender, non-distended, with normoactive bowel sounds.  Ext: Warm, no deformities Skin: No rashes, lesions or ulcers on visualized skin. Neuro: Alert and oriented. No focal neurological deficits. Psych: Judgement and insight appear fair. Mood anxious & affect congruent.  Data Reviewed: I have personally reviewed following labs and imaging studies  CBC: Recent Labs  Lab 09/19/20 0512  WBC 8.1  NEUTROABS 4.2  HGB 11.8*  HCT 36.7  MCV 90.2  PLT 274   Basic Metabolic Panel: Recent Labs  Lab 09/17/20 0307 09/18/20 1203 09/19/20 0512 09/20/20 0905 09/21/20 0737  NA 138 136 138 139 139  K 3.2* 3.4* 3.7 3.8 3.9  CL 104 105 108 106 106  CO2 26 22 22 23 25   GLUCOSE 108* 124* 109* 129* 113*  BUN 10 9 10 15 14   CREATININE 0.74 0.75 0.77 0.82 0.78  CALCIUM 8.8* 8.4* 8.9 9.1 8.9  MG  --   --  2.1  --   --  Recent Results (from the past 240 hour(s))  SARS CORONAVIRUS 2 (TAT 6-24 HRS) Nasopharyngeal Nasopharyngeal Swab     Status: None   Collection Time: 09/15/20  9:01 PM   Specimen: Nasopharyngeal Swab  Result Value Ref Range Status   SARS Coronavirus 2 NEGATIVE NEGATIVE Final    Comment: (NOTE) SARS-CoV-2 target nucleic acids are NOT DETECTED.  The SARS-CoV-2 RNA is generally detectable in upper and lower respiratory specimens during the acute phase of infection. Negative results do not preclude SARS-CoV-2 infection, do not rule out co-infections with other pathogens, and should not be used as the sole basis for treatment or other patient management decisions. Negative results must be combined with clinical observations, patient history, and epidemiological information. The expected result is Negative.  Fact Sheet for  Patients: HairSlick.no  Fact Sheet for Healthcare Providers: quierodirigir.com  This test is not yet approved or cleared by the [place] FDA and  has been authorized for detection and/or diagnosis of SARS-CoV-2 by FDA under an Emergency Use Authorization (EUA). This EUA will remain  in effect (meaning this test can be used) for the duration of the COVID-19 declaration under Se ction 564(b)(1) of the Act, 21 U.S.C. section 360bbb-3(b)(1), unless the authorization is terminated or revoked sooner.  Performed at [facility], [address] [place], [place] [address]   Culture, Urine     Status: None   Collection Time: 09/16/20  1:27 PM   Specimen: Urine, Clean Catch  Result Value Ref Range Status   Specimen Description URINE, CLEAN CATCH  Final   Special Requests NONE  Final   Culture   Final    NO GROWTH Performed at [facility], [address], [place], [place] [address]    Report Status 09/17/2020 FINAL  Final      Radiology Studies: No results found.  Scheduled Meds:  amLODipine  10 mg Oral Daily   amoxicillin-clavulanate  1 tablet Oral Q12H   atorvastatin  40 mg Oral Daily   clopidogrel  75 mg Oral Daily   doxazosin  1 mg Oral Daily   enoxaparin (LOVENOX) injection  40 mg Subcutaneous Q24H   fluconazole  100 mg Oral Daily   fluticasone  1 spray Each Nare Daily   hydrALAZINE  100 mg Oral Q8H   isosorbide dinitrate  20 mg Oral TID   losartan  100 mg Oral Daily   nebivolol  2.5 mg Oral q morning   omeprazole  40 mg Oral BID   ranolazine  500 mg Oral BID   sodium chloride flush  3 mL Intravenous Q12H   spironolactone  12.5 mg Oral Daily   sucralfate  1 g Oral BID   Continuous Infusions:  sodium chloride     sodium chloride 1 mL/kg/hr (09/16/20 1100)   promethazine (PHENERGAN) injection (IM or IVPB) 6.25 mg (09/17/20 2323)     LOS: 6 days   Time spent: 25 minutes.  Tyrone Nine,  MD [facility] [email or web address] 09/22/2020, 1:49 PM

## 2020-09-22 NOTE — Progress Notes (Signed)
Mobility Specialist: Progress Note   09/22/20 1748  Mobility  Activity Refused mobility   Pt refused mobility d/t burning sensation throughout her body as well as c/o chest pain, RN present in the room. Encouraged pt to walk later this evening or at least sit in the chair to eat dinner. Will f/u tomorrow.  [facility] Dreon Pineda Mobility Specialist Mobility Specialist Phone: [phone]

## 2020-09-22 NOTE — Consult Note (Signed)
Referring Provider: Dr. Holland Falling Primary Care Physician:  [facility] Primary Gastroenterologist:  Dr. Matthias Hughs [facility])  Reason for Consultation:  GERD, chest pain  HPI: Jennifer [name] is a 75 y.o. female with past medical history of GERD, HTN, lumbar stenosis, and CAD presenting for consultation of GERD and chest pain.  Patient reports severe GERD over the past couple of days, characterized by burning and chest pressure/discomfort. She can feel some acid in her throat and has had minimal regurgitation.  She states her GERD has been fairly well-controlled on omeprazole 20 mg daily and Carafate 1-2 times daily, though she does note breakthrough symptoms. She ate a chocolate brownie last night and is worried that may have exacerbated GERD symptoms.  She also noted intermittent nausea, worse after eating. However, is currently tolerating meals without nausea.  Denies vomiting. Denies dysphagia, changes in appetite, unexplained weight loss.  Denies changes in stool, diarrhea, melena, or hematochezia.  Borderline normal gastric emptying study in 2018. EGD in 2011 with esophageal mucosal changes (bx: mild inflammation without dysplasia or metaplasia) and gastric polyps (bx: benign fundic gland polyps).  Past Medical History:  Diagnosis Date   Acute meniscal tear of left knee    FOLLOWED BY DR GIAFFREY   BPPV (benign paroxysmal positional vertigo)    Chronic bladder pain    Chronic fatigue syndrome    Chronic low back pain    W/ RIGHT LEG PAIN AND NUMBNESS   Cystitis, chronic    Fibromyalgia    GERD (gastroesophageal reflux disease)    Hiatal hernia    Hypertension    IC (interstitial cystitis)    OSA (obstructive sleep apnea)    per pt study yrs ago-- moderate osa ,  intolerant cpap   Pinched vertebral nerve    bilateral L2 -- L3 and L3 -- L4-/  epidural injection's treatment , PT and pain clince   PONV (postoperative nausea and vomiting)    severe   S/P urinary  bladder replacement    1984  new bladder made from colon    Self-catheterizes urinary bladder    Spinal stenosis, lumbar region with neurogenic claudication    Wears glasses    Wears partial dentures    upper and lower    Past Surgical History:  Procedure Laterality Date   ABDOMINAL AORTOGRAM N/A 09/16/2020   Procedure: ABDOMINAL AORTOGRAM;  Surgeon: Iran Ouch, MD;  Location: [facility] INVASIVE [facility];  Service: Cardiovascular;  Laterality: N/A;   ABDOMINAL HYSTERECTOMY  1980   w/ Left Salpingoophorectomy   CARDIAC CATHETERIZATION  08-21-2001  dr Verdis Prime   normal coronary arteries and LVF   CARPAL TUNNEL RELEASE Bilateral 1986   CECALCYSTOPLASTY/  APPENDECTOMY  1984   at [facility]   CHOLECYSTECTOMY  1992   CYSTO WITH HYDRODISTENSION N/A 01/12/2016   Procedure: CYSTOSCOPY/HYDRODISTENSION;  Surgeon: Jethro Bolus, MD;  Location: [facility];  Service: Urology;  Laterality: N/A;   CYSTO WITH HYDRODISTENSION N/A 08/30/2016   Procedure: CYSTOSCOPY/HYDRODISTENSION OF BLADDER INJECTION OF MARCAINE/PYRIDIUM;  Surgeon: Jethro Bolus, MD;  Location: [facility];  Service: Urology;  Laterality: N/A;   LEFT HEART CATH AND CORONARY ANGIOGRAPHY N/A 09/16/2020   Procedure: LEFT HEART CATH AND CORONARY ANGIOGRAPHY;  Surgeon: Iran Ouch, MD;  Location: [facility]VAS[facility];  Service: Cardiovascular;  Laterality: N/A;   MULTIPLE CYSTO/  HYDRODISTENTION/  INSTILLATION THERAPY  last one 08-19-2009   NASAL SINUS SURGERY  1987   TONSILLECTOMY  as child   VAGINAL GROWTH REMOVED  as teen    Prior to Admission medications   Medication Sig Start Date End Date Taking? Authorizing Provider  acetaminophen (TYLENOL) 500 MG tablet Take 500 mg by mouth 2 (two) times daily as needed (for pain).   Yes [provider]  amoxicillin-clavulanate (AUGMENTIN) 500-125 MG tablet Take 1 tablet by mouth 3 (three) times daily. 09/05/20  Yes [provider]   D-Mannose 500 MG CAPS Take 500-1,500 mg by mouth 3 (three) times daily.   Yes [provider]  dextromethorphan-guaiFENesin (MUCINEX DM) 30-600 MG 12hr tablet Take 1 tablet by mouth 2 (two) times daily as needed for cough.   Yes [provider]  fexofenadine (ALLEGRA) 60 MG tablet Take 60 mg by mouth See admin instructions. Take 60 mg by mouth one to two times a day as needed for allergy symptoms 01/06/14  Yes [provider]  fluconazole (DIFLUCAN) 100 MG tablet Take 1 tablet (100 mg total) by mouth daily. Patient taking differently: Take 100 mg by mouth daily as needed (AS DIRECTED- when taking antibiotics). 08/04/20  Yes Ginnie Smart, MD  fosfomycin (MONUROL) 3 g PACK Take 3 g by mouth once. 08/10/19  Yes [provider]  HYDROcodone-acetaminophen (NORCO) 7.5-325 MG tablet Take 1 tablet by mouth See admin instructions. Take 1 tablet by mouth three to four times a day 02/07/15  Yes [provider]  ipratropium (ATROVENT) 0.06 % nasal spray Place 2 sprays into both nostrils 2 (two) times daily as needed for rhinitis.   Yes [provider]  losartan (COZAAR) 100 MG tablet Take 100 mg by mouth daily.   Yes [provider]  meclizine (ANTIVERT) 25 MG tablet Take 25 mg by mouth 3 (three) times daily as needed for dizziness.   Yes [provider]  Meth-Hyo-M Bl-Benz Acd-Ph Sal 81.6 MG TABS Take 1 tablet (81.6 mg total) by mouth 2 (two) times daily as needed. Patient taking differently: Take 1 tablet by mouth 2 (two) times daily as needed (for strong spasms and burning). 06/07/17  Yes Ginnie Smart, MD  nebivolol (BYSTOLIC) 5 MG tablet Take 5 mg by mouth daily.   Yes [provider]  nystatin cream (MYCOSTATIN) Apply topically 2 (two) times daily. 08/04/20  Yes Ginnie Smart, MD  omeprazole (PRILOSEC) 20 MG capsule Take 20 mg by mouth See admin instructions. Take 20 mg by mouth in the morning one hour before breakfast  and an additional 20 mg before evening meal as needed for GERD 02/08/14  Yes [provider]  ondansetron (ZOFRAN-ODT) 4 MG disintegrating tablet Take 4 mg by mouth every 8 (eight) hours as needed for nausea (dissolve orally). 07/01/17  Yes [provider]  sucralfate (CARAFATE) 1 G tablet Take 1 g by mouth See admin instructions. Take 1 gram by mouth in the morning and an additional 1 gram once a day for flares   Yes [provider]  triamcinolone cream (KENALOG) 0.1 % Apply 1 application topically daily as needed (for eczema). 02/03/15  Yes [provider]  COVID-19 mRNA vaccine, Moderna, 100 MCG/0.5ML injection INJECT AS DIRECTED Patient not taking: Reported on 09/15/2020 02/01/20 01/31/21  Judyann Munson, MD  methenamine (HIPREX) 1 G tablet Take by mouth as directed. TAKES WHEN NOT TAKING ANTIBIOTICS    [provider]    Scheduled Meds:  amLODipine  10 mg Oral Daily   amoxicillin-clavulanate  1 tablet Oral Q12H   atorvastatin  40  mg Oral Daily   clopidogrel  75 mg Oral Daily   doxazosin  1 mg Oral Daily   enoxaparin (LOVENOX) injection  40 mg Subcutaneous Q24H   fluconazole  100 mg Oral Daily   fluticasone  1 spray Each Nare Daily   hydrALAZINE  100 mg Oral Q8H   isosorbide dinitrate  20 mg Oral TID   losartan  100 mg Oral Daily   nebivolol  2.5 mg Oral q morning   omeprazole  20 mg Oral Once   omeprazole  40 mg Oral BID   ranolazine  500 mg Oral BID   sodium chloride flush  3 mL Intravenous Q12H   spironolactone  12.5 mg Oral Daily   sucralfate  1 g Oral BID   Continuous Infusions:  sodium chloride     sodium chloride 1 mL/kg/hr (09/16/20 1100)   promethazine (PHENERGAN) injection (IM or IVPB) 6.25 mg (09/17/20 2323)   PRN Meds:.sodium chloride, acetaminophen, alum & mag hydroxide-simeth **AND** [COMPLETED] lidocaine, hydrALAZINE, HYDROcodone-acetaminophen, morphine injection, nitroGLYCERIN, promethazine (PHENERGAN) injection (IM or  IVPB), sodium chloride flush  Allergies as of 09/15/2020 - Review Complete 09/15/2020  Allergen Reaction Noted   Moxifloxacin Hives, Shortness Of Breath, Swelling, and Rash 01/10/2016   Quinolones Anaphylaxis 03/21/2015   Sulfa antibiotics Hives and Rash 03/21/2015   Furosemide Other (See Comments) 09/15/2020   Aspirin Nausea Only and Other (See Comments) 03/21/2015   Duloxetine hcl Nausea Only and Other (See Comments) 08/02/2020   Hydrochlorothiazide Other (See Comments) 08/02/2020   Ibuprofen Nausea Only and Other (See Comments) 11/10/2015   Other Other (See Comments) 09/15/2020   Robaxin [methocarbamol] Other (See Comments) 09/15/2020   Topiramate Itching 08/02/2020   Zofran [ondansetron hcl] Other (See Comments) 09/15/2020   Codeine Hives, Nausea And Vomiting, and Rash 03/21/2015   Dimethyl sulfoxide Hives and Itching 05/03/2015   Macrodantin [nitrofurantoin macrocrystal] Itching, Nausea And Vomiting, and Rash 02/15/2017   Nitrofurantoin Diarrhea, Itching, Rash, and Other (See Comments) 03/21/2015   Tape Rash 11/10/2015   Yellow dye Rash 03/21/2015    Family History  Problem Relation Age of Onset   CAD Father    Asthma Father    Hypertension Father    Alzheimer's disease Father    Emphysema Father    COPD Father    Heart failure Mother    Peripheral vascular disease Mother    Stroke Mother    Hypertension Mother    Colonic polyp Mother    Cancer Brother        melanoma    Social History   Socioeconomic History   Marital status: Single    Spouse name: Not on file   Number of children: 0   Years of education: College   Highest education level: Not on file  Occupational History   Occupation: n/a  Tobacco Use   Smoking status: Never   Smokeless tobacco: Never  Vaping Use   Vaping Use: Never used  Substance and Sexual Activity   Alcohol use: No    Alcohol/week: 0.0 standard drinks   Drug use: No   Sexual activity: Not on file  Other Topics Concern   Not on  file  Social History Narrative   Lives alone   Caffeine use: 2 glasses tea/day   Social Determinants of Health   Financial Resource Strain: Not on file  Food Insecurity: Not on file  Transportation Needs: Not on file  Physical Activity: Not on file  Stress: Not on file  Social Connections: Not on  file  Intimate Partner Violence: Not on file    Review of Systems: Review of Systems  Constitutional:  Negative for chills, fever and weight loss.  HENT:  Negative for hearing loss and tinnitus.   Eyes:  Negative for pain and redness.  Respiratory:  Negative for cough and shortness of breath.   Cardiovascular:  Positive for chest pain. Negative for palpitations.  Gastrointestinal:  Positive for nausea. Negative for abdominal pain, blood in stool, constipation, diarrhea, heartburn, melena and vomiting.  Genitourinary:  Negative for flank pain and hematuria.  Musculoskeletal:  Negative for falls and joint pain.  Skin:  Negative for itching and rash.  Neurological:  Negative for seizures and loss of consciousness.  Endo/Heme/Allergies:  Negative for polydipsia. Does not bruise/bleed easily.  Psychiatric/Behavioral:  Negative for substance abuse. The patient is not nervous/anxious.     Physical Exam: Vital signs: Vitals:   09/21/20 2133 09/22/20 0538  BP: (!) 160/62 (!) 155/72  Pulse: 72 65  Resp: 18 18  Temp: 98.8 F (37.1 C) 98.8 F (37.1 C)  SpO2: 99% 99%   Last BM Date: 09/21/20  Physical Exam Vitals reviewed.  Constitutional:      General: She is not in acute distress. HENT:     Head: Normocephalic and atraumatic.     Nose: Nose normal. No congestion.     Mouth/Throat:     Mouth: Mucous membranes are moist.     Pharynx: Oropharynx is clear.  Eyes:     Extraocular Movements: Extraocular movements intact.     Conjunctiva/sclera: Conjunctivae normal.  Cardiovascular:     Rate and Rhythm: Normal rate and regular rhythm.  Pulmonary:     Effort: Pulmonary effort is  normal. No respiratory distress.  Abdominal:     General: Bowel sounds are normal. There is no distension.     Palpations: Abdomen is soft. There is no mass.     Tenderness: There is no abdominal tenderness. There is no guarding or rebound.     Hernia: No hernia is present.  Musculoskeletal:        General: No swelling or tenderness.     Cervical back: Normal range of motion and neck supple.  Skin:    General: Skin is warm and dry.  Neurological:     General: No focal deficit present.     Mental Status: She is alert and oriented to person, place, and time.  Psychiatric:        Mood and Affect: Mood normal.        Behavior: Behavior normal. Behavior is cooperative.    GI:  Lab Results: No results for input(s): WBC, HGB, HCT, PLT in the last 72 hours. BMET Recent Labs    09/20/20 0905 09/21/20 0737  NA 139 139  K 3.8 3.9  CL 106 106  CO2 23 25  GLUCOSE 129* 113*  BUN 15 14  CREATININE 0.82 0.78  CALCIUM 9.1 8.9   LFT No results for input(s): PROT, ALBUMIN, AST, ALT, ALKPHOS, BILITOT, BILIDIR, IBILI in the last 72 hours. PT/INR No results for input(s): LABPROT, INR in the last 72 hours.   Studies/Results: No results found.  Impression: GERD, chest pain: chest pain responds to Nitro per chart review. Likely multifactorial (cardiac, GERD)  Resistant HTN  CAD: significant functional stenosis in LAD, not amenable to PCI, on Plaxix  CHF (EF 60-65%)  Lumbar stenosis   Plan: Given current cardiac disease and HTN, would recommend maximization of medical management for GERD.  Defer EGD at this time, given no red flag findings or instability related to GI symptoms. Consider outpatient EGD once patient is more stable from a cardiac and HTN standpoint.  Increase omeprazole to 40 mg BID for 2 months, then consider decreasing to once daily dosing depending on symptoms (patient has appt with Dr. Matthias HughsBuccini on 7/20).  Continue Carafate twice daily.  Discussed avoiding  caffeine, spicy/acidic foods, chocolate, and other foods that can worsen GERD.  Elevate the head of the bed to 30 degrees or greater.   LOS: 6 days   Edrick Kinslison Baron-Johnson  PA-C 09/22/2020, 12:23 PM  Contact #  [phone]

## 2020-09-23 ENCOUNTER — Other Ambulatory Visit: Payer: Self-pay

## 2020-09-23 LAB — BASIC METABOLIC PANEL
Anion gap: 6 (ref 5–15)
BUN: 15 mg/dL (ref 8–23)
CO2: 26 mmol/L (ref 22–32)
Calcium: 8.5 mg/dL — ABNORMAL LOW (ref 8.9–10.3)
Chloride: 104 mmol/L (ref 98–111)
Creatinine, Ser: 0.78 mg/dL (ref 0.44–1.00)
GFR, Estimated: >60 mL/min (ref >60)
Glucose, Bld: 117 mg/dL — ABNORMAL HIGH (ref 70–99)
Potassium: 3.5 mmol/L (ref 3.5–5.1)
Sodium: 136 mmol/L (ref 135–145)

## 2020-09-23 LAB — TROPONIN I (HIGH SENSITIVITY)
Troponin I (High Sensitivity): 9 ng/L (ref <18)
Troponin I (High Sensitivity): 8 ng/L (ref <18)

## 2020-09-23 MED ORDER — HYDRALAZINE HCL 50 MG PO TABS
75.0000 mg | ORAL_TABLET | Freq: Three times a day (TID) | ORAL | Status: DC
  Administered 2020-09-23 – 2020-09-27 (×10): 75 mg via ORAL
  Filled 2020-09-23 (×11): qty 1

## 2020-09-23 MED ORDER — SPIRONOLACTONE 25 MG PO TABS
25.0000 mg | ORAL_TABLET | Freq: Every day | ORAL | Status: DC
  Administered 2020-09-24 – 2020-09-27 (×4): 25 mg via ORAL
  Filled 2020-09-23 (×4): qty 1

## 2020-09-23 MED ORDER — MAGNESIUM HYDROXIDE 400 MG/5ML PO SUSP
15.0000 mL | Freq: Four times a day (QID) | ORAL | Status: DC | PRN
  Administered 2020-09-23: 15 mL via ORAL
  Filled 2020-09-23: qty 30

## 2020-09-23 MED ORDER — FUROSEMIDE 40 MG PO TABS
40.0000 mg | ORAL_TABLET | Freq: Once | ORAL | Status: AC
  Administered 2020-09-23: 40 mg via ORAL
  Filled 2020-09-23: qty 1

## 2020-09-23 MED ORDER — ISOSORBIDE DINITRATE 10 MG PO TABS
10.0000 mg | ORAL_TABLET | Freq: Three times a day (TID) | ORAL | Status: DC
  Administered 2020-09-23 – 2020-09-27 (×12): 10 mg via ORAL
  Filled 2020-09-23 (×13): qty 1

## 2020-09-23 NOTE — Progress Notes (Signed)
Pt is complaining of 10/10 chest burning pain after taking night medications. Multiple EKG obtained, no changes. Patient requesting morphine. MD notified.

## 2020-09-23 NOTE — Progress Notes (Signed)
PROGRESS NOTE  Jennifer [name]  FYB:[id number] DOB: [birth year] DOA: 09/15/2020 PCP: [facility]   Brief Narrative: Jennifer [name] is a 75 y.o. female with a history of resistant HTN, HLD, GERD, interstitial cystitis requiring intermittent self-catheterization, lumbar stenosis causing chronic pain who was sent to the ED by cardiology due to chest discomfort and HTN urgency on 6/16. Blood pressure has been labile but overall improved.   Assessment & Plan: Principal Problem:   Hypertensive urgency Active Problems:   GERD (gastroesophageal reflux disease)   Chest pain   Interstitial cystitis (chronic) without hematuria   Chronic low back pain   Chronic diastolic CHF (congestive heart failure) (HCC)   Unstable angina (HCC)   Hypertensive crisis  Hypertensive emergency, resistant HTN: SBP up to 260 with chest pain at [facility]  - Abdominal aortogram at time of cath did not reveal renal artery stenosis.  - Aldo + renin w/ratio lab checked 6/19 (still not resulted). - Other symptoms not consistent with pheochromocytoma, though dedicated work up could be pursued as an outpatient.  - Cardiology managing antihypertensives: Isordil, ranexa, bystolic, norvasc, losartan, doxazosin, hydralazine, spironolactone (increase dose today). - Ranexa dose limited by fluconazole interaction. - Monitor Cr and K in AM.  CAD: CT FFR revealed significant functional stenosis in LAD.  - Continue BP control as above - Continue statin, beta blocker - Lesion not amenable to PCI (tortuosity, extensive disease distal to stenosis). Multiple opinions sought with consistent fear that risk of cardiac event (coronary dissection and/or occlusion of sidebranch) is much more likely than any benefit that may be derived from attempted PCI. Hopeful for optimization of medical therapy and patient observation for development of collaterals.  Chest pain: Multifactorial. With known LAD lesion, angina  may be at play, though of late troponin has been normal with unchanged ECG and minimal improvement with NTG. GERD is certainly contributing. ?if pill burden worsening esophagitis, though not benefiting from GI cocktail very either. Strongly suspect anxiety may be an increasingly prominent contributor to this and other symptoms (e.g. subjective swelling without weight gain or edema on exam) - Continue cardiac and GERD medications, both scheduled and prn.  - Due to pt's concern for polypharmacy, not amenable to anxiolytic at this time.   Chronic HFpEF: LVEF 60-65%, no WMA, mod LVH, G1DD on echo. Appears stable volumewise.  - No diuretic needed currently - Dietitian consult  GERD: ?if this is contributing to ongoing chest complaints, particularly atypical features.  - Pt's GI is Dr. Matthias Hughs who has been consulted, recommended augmenting PPI, continuing carafate. No plans for EGD as inpatient.  Lumbar stenosis, chronic pain:  - Continue home medications (norco)  Chronic interstitial cystitis:  - No changes to management currently.  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  Cutaneous candidiasis:  - Continue fluconazole  Obesity: Estimated body mass index is 33.27 kg/m as calculated from the following:   Height as of this encounter: 5\' 5"  (1.651 m).   Weight as of this encounter: 90.7 kg.  DVT prophylaxis: Lovenox Code Status: Full Family Communication: None at bedside Disposition Plan:  Status is: Inpatient  Remains inpatient appropriate because:Ongoing diagnostic testing needed not appropriate for outpatient work up and Inpatient level of care appropriate due to severity of illness  Dispo: The patient is from: Home              Anticipated d/c is to: Home, likely 6/25.  Patient currently is not medically stable to d/c.   Difficult to place patient No  Consultants:  Cardiology GI  Procedures:  LHC  09/16/20:  Mid LAD lesion is 50% stenosed. 3rd Diag lesion is 60% stenosed. Dist LAD-1 lesion is 90% stenosed. Dist LAD-2 lesion is 40% stenosed.  1.  Significant one-vessel coronary artery disease involving the mid to distal LAD at the bifurcation of the third diagonal which has also 60% ostial stenosis.  The coronary arteries are very tortuous suggestive of hypertensive heart disease. 2.  Left ventricular angiography was not performed.  EF was normal by echo. 3.  Severely elevated blood pressure with mildly elevated left ventricular end-diastolic pressure. 4.  Transient left bundle branch block after engaging the left ventricle.  This resolved by the end of the case. 5.  Abdominal aortogram was performed and showed no evidence of renal artery stenosis.  Recommendations: Unfortunately, the LAD stenosis is in a very tortuous segment and at the origin of the third diagonal branch which also has ostial disease.  PCI in this area will be high risk for dissection as well as sidebranch closure especially in the setting of uncontrolled hypertension.  In addition, the distal LAD after the stenosis is diffusely diseased. I recommend maximizing medical therapy and controlling blood pressure before considering PCI.  I added amlodipine to her antihypertensive medications. Other complicating issue is that the patient cannot tolerate aspirin long-term.  If we decide to proceed with PCI at some point, we will have to make sure she tolerates ticagrelor which can likely be used as monotherapy.   Antimicrobials: None   Subjective: Again having chest discomfort this morning minimally responsive to GI cocktail and NTG, ultimately passed on its own. No current chest pain. Got to chair today, but hasn't been walking regularly. BP as low as 95/51 last night.   Objective: Vitals:   09/23/20 0432 09/23/20 0952 09/23/20 1156 09/23/20 1420  BP: 133/64 (!) 141/66  (!) 117/57  Pulse: 67     Resp: 18     Temp: 98.2  F (36.8 C)     TempSrc: Oral     SpO2: 92%     Weight:   90.7 kg   Height:        Intake/Output Summary (Last 24 hours) at 09/23/2020 1443 Last data filed at 09/23/2020 0443 Gross per 24 hour  Intake --  Output 400 ml  Net -400 ml    Filed Weights   09/15/20 1226 09/16/20 0142 09/23/20 1156  Weight: 91.2 kg 90.3 kg 90.7 kg   Gen: 75 y.o. female in no distress Pulm: Nonlabored breathing room air. Clear. CV: Regular rate and rhythm. II/VI SEM, no other murmur, rub, or gallop. No JVD, no dependent edema noted. GI: Abdomen soft, non-tender, non-distended, with normoactive bowel sounds.  Ext: Warm, no deformities Skin: No rashes, lesions or ulcers on visualized skin. Neuro: Alert and oriented. No focal neurological deficits. Psych: Judgement and insight appear fair. Mood anxious & affect congruent. At times pressured speech    Data Reviewed: I have personally reviewed following labs and imaging studies  CBC: Recent Labs  Lab 09/19/20 0512  WBC 8.1  NEUTROABS 4.2  HGB 11.8*  HCT 36.7  MCV 90.2  PLT 274   Basic Metabolic Panel: Recent Labs  Lab 09/18/20 1203 09/19/20 0512 09/20/20 0905 09/21/20 0737 09/23/20 0413  NA 136 138 139 139 136  K 3.4* 3.7 3.8 3.9 3.5  CL 105 108 106 106 104  CO2  22 22 23 25 26   GLUCOSE 124* 109* 129* 113* 117*  BUN 9 10 15 14 15   CREATININE 0.75 0.77 0.82 0.78 0.78  CALCIUM 8.4* 8.9 9.1 8.9 8.5*  MG  --  2.1  --   --   --    Recent Results (from the past 240 hour(s))  SARS CORONAVIRUS 2 (TAT 6-24 HRS) Nasopharyngeal Nasopharyngeal Swab     Status: None   Collection Time: 09/15/20  9:01 PM   Specimen: Nasopharyngeal Swab  Result Value Ref Range Status   SARS Coronavirus 2 NEGATIVE NEGATIVE Final    Comment: (NOTE) SARS-CoV-2 target nucleic acids are NOT DETECTED.  The SARS-CoV-2 RNA is generally detectable in upper and lower respiratory specimens during the acute phase of infection. Negative results do not preclude SARS-CoV-2  infection, do not rule out co-infections with other pathogens, and should not be used as the sole basis for treatment or other patient management decisions. Negative results must be combined with clinical observations, patient history, and epidemiological information. The expected result is Negative.  Fact Sheet for Patients:  Fact Sheet for Healthcare Providers: 09/17/20  This test is not yet approved or cleared by the [employer] FDA and  has been authorized for detection and/or diagnosis of SARS-CoV-2 by FDA under an Emergency Use Authorization (EUA). This EUA will remain  in effect (meaning this test can be used) for the duration of the COVID-19 declaration under Se ction 564(b)(1) of the Act, 21 U.S.C. section 360bbb-3(b)(1), unless the authorization is terminated or revoked sooner.  Performed at [facility], [address], [place], [address] [place]   Culture, Urine     Status: None   Collection Time: 09/16/20  1:27 PM   Specimen: Urine, Clean Catch  Result Value Ref Range Status   Specimen Description URINE, CLEAN CATCH  Final   Special Requests NONE  Final   Culture   Final    NO GROWTH Performed at [facility], [address] [place], [address] [place]    Report Status 09/17/2020 FINAL  Final      Radiology Studies: No results found.  Scheduled Meds:  alum & mag hydroxide-simeth  30 mL Oral Once   amLODipine  10 mg Oral Daily   amoxicillin-clavulanate  1 tablet Oral Q12H   atorvastatin  40 mg Oral Daily   clopidogrel  75 mg Oral Daily   doxazosin  1 mg Oral Daily   enoxaparin (LOVENOX) injection  40 mg Subcutaneous Q24H   fluconazole  100 mg Oral Daily   fluticasone  1 spray Each Nare Daily   hydrALAZINE  100 mg Oral Q8H   isosorbide dinitrate  20 mg Oral TID   losartan  100 mg Oral Daily   nebivolol  2.5 mg Oral q morning   omeprazole  40 mg Oral BID    ranolazine  500 mg Oral BID   sodium chloride flush  3 mL Intravenous Q12H   [START ON 09/24/2020] spironolactone  25 mg Oral Daily   sucralfate  1 g Oral BID   Continuous Infusions:  sodium chloride     sodium chloride 1 mL/kg/hr (09/16/20 1100)   promethazine (PHENERGAN) injection (IM or IVPB) 6.25 mg (09/17/20 2323)     LOS: 7 days   Time spent: 25 minutes.  09/18/20, MD [facility] [email or web address] 09/23/2020, 2:43 PM

## 2020-09-23 NOTE — Progress Notes (Signed)
Physical Therapy Treatment Patient Details Name: Jennifer [name] MRN: [id number] DOB: Aug 30, 1945 Today's Date: 09/23/2020    History of Present Illness Pt is a 75 y.o. female admitted 09/15/20 with c/o chest pain. Workup for hypertensive urgency. Workup revealed significant stenosis in LAD, not amenable to PCI. PMH includes HTN, HLD, insterstitial cystitis requiring intermittent self-cath, lumbar stenosis, chronic pain, fibromyalgia.   PT Comments    Pt progressing with mobility. Tolerated hallway ambulation, limited by c/o fatigue and SOB requiring seated rest break. Pt requires encouragement to increase ambulation distance. Encouraged additional activity with mobility specialist this evening. Will plan for stair training next session.  SpO2 95% on RA, HR 80s-90s   Follow Up Recommendations  Outpatient PT (for back)     Equipment Recommendations  None recommended by PT    Recommendations for Other Services       Precautions / Restrictions Precautions Precautions: Fall Restrictions Weight Bearing Restrictions: No    Mobility  Bed Mobility               General bed mobility comments: received sitting EOB    Transfers Overall transfer level: Independent Equipment used: None                Ambulation/Gait Ambulation/Gait assistance: Independent Gait Distance (Feet): 480 Feet Assistive device: None Gait Pattern/deviations: Step-through pattern;Decreased stride length;Trunk flexed Gait velocity: Decreased   General Gait Details: Slow, fatigued gait without DME; pt physically moving independently, but requiring encouragement to increase ambulation distance; 2x standing rest break secondary to SOB, cues for pursed lip breathing; 1x seated rest break secondary to fatigue   Stairs Stairs:  (pt declined)           Wheelchair Mobility    Modified Rankin (Stroke Patients Only)       Balance Overall balance assessment: No apparent balance deficits  (not formally assessed)   Sitting balance-Leahy Scale: Good       Standing balance-Leahy Scale: Good                              Cognition Arousal/Alertness: Awake/alert Behavior During Therapy: WFL for tasks assessed/performed Overall Cognitive Status: Within Functional Limits for tasks assessed                                 General Comments: WFL for majority of simple tasks; pt verbose in conversation and quick to switch subjects requiring intermittent redirection to current topic/task      Exercises      General Comments        Pertinent Vitals/Pain Pain Assessment: Faces Faces Pain Scale: Hurts a little bit Pain Location: Generalized Pain Descriptors / Indicators: Tiring Pain Intervention(s): Monitored during session    Home Living                      Prior Function            PT Goals (current goals can now be found in the care plan section) Progress towards PT goals: Progressing toward goals    Frequency    Min 3X/week      PT Plan Current plan remains appropriate    Co-evaluation              AM-PAC PT "6 Clicks" Mobility   Outcome Measure  Help needed turning from your back to your  side while in a flat bed without using bedrails?: None Help needed moving from lying on your back to sitting on the side of a flat bed without using bedrails?: None Help needed moving to and from a bed to a chair (including a wheelchair)?: None Help needed standing up from a chair using your arms (e.g., wheelchair or bedside chair)?: None Help needed to walk in hospital room?: None Help needed climbing 3-5 steps with a railing? : A Little 6 Click Score: 23    End of Session   Activity Tolerance: Patient tolerated treatment well Patient left: with call bell/phone within reach;in bed Nurse Communication: Mobility status PT Visit Diagnosis: Other abnormalities of gait and mobility (R26.89);Pain     Time: 5916-3846 PT  Time Calculation (min) (ACUTE ONLY): 16 min  Charges:  $Therapeutic Exercise: 8-22 mins                     Ina Homes, PT, DPT Acute Rehabilitation Services  Pager [phone] Office [phone]  Malachy Chamber 09/23/2020, 5:26 PM

## 2020-09-23 NOTE — Progress Notes (Signed)
Patient had severe chest pain across her upper chest last night, somewhat different from her usual reflux pain, despite 2 doses of omeprazole earlier in the day.  (Since then, her dose has been increased to 40 mg twice daily.)  Her troponins came back negative.  She also points out that her reflux pain has been under good control in recent months, even on low-dose omeprazole.  She has known severe single-vessel coronary disease, not amenable to stenting.  She also has blood pressure which has been difficult to control.  Exam: The patient is sitting in the bedside chair, comfortable and relaxed, not tearful like yesterday.  She is in no distress whatsoever.  Impression: Breakthrough pain despite high-dose omeprazole therapy.  Although I suspect this was pain due to coronary ischemia, it remains unclear if this could have been breakthrough reflux pain, because the patient's full high-dose omeprazole therapy had not been instituted prior to the pain.  Recommendation:  1.  Continue high-dose PPI therapy  2.  Continue cardiology follow-up  3.  We will follow with you for the time being.  I still do not feel that endoscopic evaluation in this patient would be likely to help the patient, and would be somewhat high risk in view of her possible unstable angina.  Florencia Reasons, M.D. Pager [phone] If no answer or after 5 PM call [phone]

## 2020-09-23 NOTE — Progress Notes (Signed)
TR[facility] night shift.  The patient continues to have recurrence of burning chest pain.  The staff has given her GI cocktail along with Norco.  Her pain is similar to previous episodes.  I have ordered troponin x2 to rule out ACS.  Sanda Klein, MD.

## 2020-09-23 NOTE — Progress Notes (Signed)
Brief Nutrition Note  Attempted to call pt via phone to provide education on Heart Healthy Diet. RD working remotely.  Pt did not answer phone. RD to follow up to try again when appropriate.  RD attached "Heart Healthy Nutrition Therapy" handout from the [employer] to pt's discharge summary.  If need arises, please re-consult RD.  Vertell Limber, RD, LDN (she/her/hers) Registered Dietitian I After-Hours/Weekend Pager # in [place]

## 2020-09-23 NOTE — Progress Notes (Addendum)
Progress Note  Patient Name: Jennifer [name] Date of Encounter: 09/23/2020  Primary Cardiologist: Werner Lean, MD   Subjective   Multiple complaints today. Feels like she is swelling, lots of bringing in her chest/epigastric area.   Inpatient Medications    Scheduled Meds:  alum & mag hydroxide-simeth  30 mL Oral Once   amLODipine  10 mg Oral Daily   amoxicillin-clavulanate  1 tablet Oral Q12H   atorvastatin  40 mg Oral Daily   clopidogrel  75 mg Oral Daily   doxazosin  1 mg Oral Daily   enoxaparin (LOVENOX) injection  40 mg Subcutaneous Q24H   fluconazole  100 mg Oral Daily   fluticasone  1 spray Each Nare Daily   hydrALAZINE  100 mg Oral Q8H   isosorbide dinitrate  20 mg Oral TID   losartan  100 mg Oral Daily   nebivolol  2.5 mg Oral q morning   omeprazole  40 mg Oral BID   ranolazine  500 mg Oral BID   sodium chloride flush  3 mL Intravenous Q12H   spironolactone  12.5 mg Oral Daily   sucralfate  1 g Oral BID   Continuous Infusions:  sodium chloride     sodium chloride 1 mL/kg/hr (09/16/20 1100)   promethazine (PHENERGAN) injection (IM or IVPB) 6.25 mg (09/17/20 2323)   PRN Meds: sodium chloride, acetaminophen, alum & mag hydroxide-simeth **AND** [COMPLETED] lidocaine, hydrALAZINE, HYDROcodone-acetaminophen, nitroGLYCERIN, promethazine (PHENERGAN) injection (IM or IVPB), sodium chloride flush   Vital Signs    Vitals:   09/22/20 1242 09/22/20 2000 09/22/20 2325 09/23/20 0432  BP: (!) 136/58 (!) 95/51 121/64 133/64  Pulse:  (!) 58  67  Resp: 18 18  18   Temp: 98.5 F (36.9 C) 98.1 F (36.7 C)  98.2 F (36.8 C)  TempSrc: Oral Oral  Oral  SpO2:  99%  92%  Weight:      Height:        Intake/Output Summary (Last 24 hours) at 09/23/2020 0917 Last data filed at 09/23/2020 0443 Gross per 24 hour  Intake --  Output 400 ml  Net -400 ml   Filed Weights   09/15/20 1226 09/16/20 0142  Weight: 91.2 kg 90.3 kg    Physical Exam   General: Well  developed, well nourished, NAD Neck: Negative for carotid bruits. No JVD Lungs:Clear to ausculation bilaterally. No wheezes, rales, or rhonchi. Breathing is unlabored. Cardiovascular: RRR with S1 S2. + murmurs Abdomen: Soft, non-tender, non-distended. No obvious abdominal masses. Extremities: No edema.  Neuro: Alert and oriented. No focal deficits. No facial asymmetry. MAE spontaneously. Psych: Responds to questions appropriately with normal affect.    Labs    Chemistry Recent Labs  Lab 09/20/20 0905 09/21/20 0737 09/23/20 0413  NA 139 139 136  K 3.8 3.9 3.5  CL 106 106 104  CO2 23 25 26   GLUCOSE 129* 113* 117*  BUN 15 14 15   CREATININE 0.82 0.78 0.78  CALCIUM 9.1 8.9 8.5*  GFRNONAA >60 >60 >60  ANIONGAP 10 8 6      Hematology Recent Labs  Lab 09/19/20 0512  WBC 8.1  RBC 4.07  HGB 11.8*  HCT 36.7  MCV 90.2  MCH 29.0  MCHC 32.2  RDW 13.5  PLT 274    Cardiac EnzymesNo results for input(s): TROPONINI in the last 168 hours. No results for input(s): TROPIPOC in the last 168 hours.   BNPNo results for input(s): BNP, PROBNP in the last 168 hours.  DDimer No results for input(s): DDIMER in the last 168 hours.   Radiology    No results found.  Telemetry    09/23/20 NSR - Personally Reviewed  ECG    No new tracing as of 09/23/20 - Personally Reviewed  Cardiac Studies   LHC 09/16/20:   Mid LAD lesion is 50% stenosed. 3rd Diag lesion is 60% stenosed. Dist LAD-1 lesion is 90% stenosed. Dist LAD-2 lesion is 40% stenosed.   1.  Significant one-vessel coronary artery disease involving the mid to distal LAD at the bifurcation of the third diagonal which has also 60% ostial stenosis.  The coronary arteries are very tortuous suggestive of hypertensive heart disease. 2.  Left ventricular angiography was not performed.  EF was normal by echo. 3.  Severely elevated blood pressure with mildly elevated left ventricular end-diastolic pressure. 4.  Transient left bundle  branch block after engaging the left ventricle.  This resolved by the end of the case. 5.  Abdominal aortogram was performed and showed no evidence of renal artery stenosis.   Recommendations: Unfortunately, the LAD stenosis is in a very tortuous segment and at the origin of the third diagonal branch which also has ostial disease.  PCI in this area will be high risk for dissection as well as sidebranch closure especially in the setting of uncontrolled hypertension.  In addition, the distal LAD after the stenosis is diffusely diseased. I recommend maximizing medical therapy and controlling blood pressure before considering PCI.  I added amlodipine to her antihypertensive medications. Other complicating issue is that the patient cannot tolerate aspirin long-term.  If we decide to proceed with PCI at some point, we will have to make sure she tolerates ticagrelor which can likely be used as monotherapy.      Diagnostic Dominance: Right      Intervention       Echocardiogram 09/15/20:   1. Left ventricular ejection fraction, by estimation, is 60 to 65%. The  left ventricle has normal function. The left ventricle has no regional  wall motion abnormalities. There is moderate left ventricular hypertrophy.  Left ventricular diastolic  parameters are consistent with Grade I diastolic dysfunction (impaired  relaxation). Elevated left atrial pressure. The average left ventricular  global longitudinal strain is -20.4 %. The global longitudinal strain is  normal.   2. Right ventricular systolic function is normal. The right ventricular  size is normal.   3. The mitral valve is normal in structure. Trivial mitral valve  regurgitation. No evidence of mitral stenosis.   4. The aortic valve is tricuspid. Aortic valve regurgitation is not  visualized. Mild aortic valve stenosis.   5. Aortic dilatation noted. There is mild dilatation of the ascending  aorta, measuring 43 mm.   6. The inferior vena  cava is normal in size with greater than 50%  respiratory variability, suggesting right atrial pressure of 3 mmHg.   Comparison(s): 07/11/17 EF 60-65%. PA pressure 3mHg.  Patient Profile     75y.o. female with a hx of hypertension, PVC, HLD, chronic pain syndrome with lumbar stenosis was admitted after she went to the office w/ CP and SBP 260 >> sent to ER >> cardiac CT w/ LAD stenosis >> admitted for cath.  Assessment & Plan    Chest pain: -CCTA with significant LAD disease>>subsequent LHC with patent LM, mid LAD 50%, distal LAD 90%, D3 60%. Cath noted LAD lesion with a very tortuous segment at branching point of D3, high risk for dissection vs side  branch closure, and the distal LAD is diffusely diseased limiting benefit. Recommendations were for medical management and could consider PCI if recurrent chest pain on optimal anti anginal therapy. -Limited tolerance to ASA, continue Plavix  -Dr. Tamala Julian personally reviewed patient films who feels that PCI would be high risk with risk of dissection or side branch occlusion  -Continue isordil (intolerant to IMDUR)>>using 36m tabs due to dye intolerance in 226mtabs -Continue Ranexa and nevibolol -Continues to have "burning" sensations in her anterior chest/epigastric region. Reports a dose of GI cocktail this morning with some relief. Likely a combination of both GI/CV etiology of her myriad of complaints. Feels that polypharmacy is playing a role in her current symptoms.  -Encouraged her to work closer with PT and ambulate more>>>goal to get her closer to discharge>>does not feel like she can home feeling like this   2. HTN: -Remains labile, but more controlled than admission pressure -133/64>>121/64>>95/51>>136/58 -Continue amlodipine 10, hydralazine 100, losartan 10939bystolic 2.5 -Ranexa added at 500 BID  -Started on doxazosin and spironolactone  -Increase spiro to 2544mue to complaints of "swelling" despite euvolemia on exam -Obtain  weight    3. Mild AS: -Echo this admission with mild AS -Continue statin   4. Aortic root dilatation: -65m31mscending aorta -Will continue with serial imagining in the OP setting -Needs better OP HTN control     Signed, JillKathyrn DrownC HearMonte Vistaer: [phone]/2022, 9:17 AM     For questions or updates, please contact   Please consult [email or web address] for contact info under Cardiology/STEMI.  Patient seen and examined with JillKathyrn Drown-C.  Agree as above, with the following exceptions and changes as noted below.  Discussed Mrs. Gerdts's concerns at the bedside today.  She continues to have vague burning chest discomfort not responsive to nitro anymore and not reliably responsive to a GI cocktail, but she does consistently describe it as post medication for over the last 2 days.  She is requesting a pharmacy consult, I have contacted the pharmacist personally and they will plan to see her today to review her medications.  I informed the patient I have not seen her out of bed in 8 days, and would strongly recommend participating with physical therapy and walking in the halls with her nurse so as to avoid dismissal to a rehab facility.  She categorically declines this.  I am hopeful she will be able to be dismissed home when she is ready.  Gen: NAD, CV: RRR, no murmurs, Lungs: clear, Abd: soft, Extrem: Warm, well perfused, no edema, Neuro/Psych: alert and oriented x 3, normal mood and affect. All available labs, radiology testing, previous records reviewed.  Continue therapy for hypertension as well as antianginal therapy.  Unclear if Ranexa has had a benefit, if pharmacist feels this is a contributor to her burning GI symptoms, it can be held.  Patient feels she is swelling all over, we will increase the dose of her spironolactone today for both blood pressure as well as subjective swelling.  Bystolic has been uptitrated prior during this admission with bradycardia therefore  beta-blocker cannot be further escalated for antianginal effect.  During episodes of chest pain she has had normal ECG and during acute chest pain yesterday serial troponins were normal.  If blood pressure remains stable, no further cardiovascular work-up is required inpatient and medication regimen is felt to be relatively stable.  Hospital discharge can be determined by internal medicine team, no barriers from cardiovascular standpoint as of 09/23/2020.  Elouise Munroe, MD 09/23/20 12:47 PM  Addendum: Patient requested pharmacist consult, I discussed consult with Janett Billow our cardiac pharmacist after they had a chance to speak.  Patient feels like hydralazine is giving her worsening symptoms of malaise and GERD, will decrease the dose and monitor for impact.  In addition we will decrease the dose of Isordil since she is on high-dose Isordil for the benefit of endothelial dysfunction and management of angina but continues to feel poorly.  Patient feels subjectively as if she is swelling.  No objective evidence of volume overload and weights have been stable.  She has a low tissue Doppler on her echocardiogram, based on review of her echocardiogram images she likely has grade 1 diastolic dysfunction.  Given her concerns, we will trial a low-dose of Lasix for benefit.  In addition we uptitrated her spironolactone today.  I remain concerned that she will have difficulty tolerating the necessary antihypertensive regimen due to medication sensitivities and intolerances.  I am hopeful these adjustments will be beneficial to her symptoms.

## 2020-09-23 NOTE — Progress Notes (Signed)
Mobility Specialist: Progress Note   09/23/20 1738  Mobility  Activity Ambulated in hall  Level of Assistance Independent  Assistive Device None  Distance Ambulated (ft) 200 ft  Mobility Ambulated independently in hallway  Mobility Response Tolerated well  Mobility performed by Mobility specialist  $Mobility charge 1 Mobility   Pre-Mobility: 67 HR, 103/77 BP, 96% SpO2 Post-Mobility: 84 HR, 140/55 BP, 97% SpO2  Pt c/o feeling SOB during ambulation requiring one short standing break, otherwise asx. Pt is sitting EOB after walk with call bell at her side.   [facility] Jennifer [name] Mobility Specialist Mobility Specialist Phone: [phone]

## 2020-09-24 DIAGNOSIS — I25119 Atherosclerotic heart disease of native coronary artery with unspecified angina pectoris: Secondary | ICD-10-CM

## 2020-09-24 LAB — ALDOSTERONE + RENIN ACTIVITY W/ RATIO
ALDO / PRA Ratio: <2.3 (ref 0.0–30.0)
Aldosterone: <1 ng/dL (ref 0.0–30.0)
PRA LC/MS/MS: 0.442 ng/mL/hr (ref 0.167–5.380)

## 2020-09-24 NOTE — Progress Notes (Addendum)
PROGRESS NOTE  Jennifer [name]  JGG:[id number] DOB: 12/24/1945 DOA: 09/15/2020 PCP: [facility]   Brief Narrative: Jennifer [name] is a 75 y.o. female with a history of resistant HTN, HLD, GERD, interstitial cystitis requiring intermittent self-catheterization, lumbar stenosis causing chronic pain who was sent to the ED by cardiology due to chest discomfort and HTN urgency on 6/16. Blood pressure has been labile but overall improved.   Assessment & Plan: Principal Problem:   Hypertensive urgency Active Problems:   GERD (gastroesophageal reflux disease)   Chest pain   Interstitial cystitis (chronic) without hematuria   Chronic low back pain   Chronic diastolic CHF (congestive heart failure) (HCC)   Unstable angina (HCC)   Hypertensive crisis  Hypertensive emergency, resistant HTN: SBP up to 260 with chest pain at [facility].  - Abdominal aortogram at time of cath did not reveal renal artery stenosis.  - Aldo + renin w/ratio lab checked 6/19 (still not resulted). - Other symptoms not consistent with pheochromocytoma, though dedicated work up could be pursued as an outpatient.  - Cardiology managing antihypertensives: Isordil, ranexa, bystolic, norvasc, losartan, doxazosin, hydralazine, spironolactone (increase dose today). - Ranexa dose limited by fluconazole interaction. - Monitor Cr and K in AM.  CAD: CT FFR revealed significant functional stenosis in LAD.  - Continue BP control as above - Continue statin, beta blocker - Lesion not amenable to PCI (tortuosity, extensive disease distal to stenosis). Multiple opinions sought with consistent fear that risk of cardiac event (coronary dissection and/or occlusion of sidebranch) is much more likely than any benefit that may be derived from attempted PCI. Hopeful for optimization of medical therapy and patient observation for development of collaterals.  Chest pain: Multifactorial. With known LAD lesion, angina  may be at play, though of late troponin has been normal with unchanged ECG and minimal improvement with NTG. GERD is certainly contributing. ?if pill burden worsening esophagitis, though not benefiting from GI cocktail very either. Strongly suspect anxiety may be an increasingly prominent contributor to this and other symptoms (e.g. subjective swelling without weight gain or edema on exam) - Continue cardiac and GERD medications, both scheduled and prn.  - Due to pt's concern for polypharmacy, not amenable to anxiolytic at this time.   Chronic HFpEF: LVEF 60-65%, no WMA, mod LVH, G1DD on echo. Appears stable volumewise.  - No diuretic needed currently - Dietitian consult  GERD: ?if this is contributing to ongoing chest complaints, particularly atypical features.  - Pt's GI is Dr. Matthias Hughs who has been consulted, recommended augmenting PPI, continuing carafate. No plans for EGD as inpatient.  Lumbar stenosis, chronic pain:  - Continue home medications (norco)  Chronic interstitial cystitis:  - No changes to management currently.  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  Cutaneous candidiasis:  - Continue fluconazole  Obesity: Estimated body mass index is 33.38 kg/m as calculated from the following:   Height as of this encounter: 5\' 5"  (1.651 m).   Weight as of this encounter: 91 kg.  DVT prophylaxis: Lovenox Code Status: Full Family Communication: None at bedside Disposition Plan:  Status is: Inpatient  Remains inpatient appropriate because:Ongoing diagnostic testing needed not appropriate for outpatient work up and Inpatient level of care appropriate due to severity of illness   Unsafe d/c due to weakness and has 3 flights stairs to enter home; requires evaluation of tolerance / safety navigating stairs.  Dispo: The patient  is from: Home              Anticipated d/c is to: Home, likely 6/25.              Patient  currently is not medically stable to d/c.   Difficult to place patient No  Consultants:  Cardiology GI  Procedures:  LHC 09/16/20:  Mid LAD lesion is 50% stenosed. 3rd Diag lesion is 60% stenosed. Dist LAD-1 lesion is 90% stenosed. Dist LAD-2 lesion is 40% stenosed.  1.  Significant one-vessel coronary artery disease involving the mid to distal LAD at the bifurcation of the third diagonal which has also 60% ostial stenosis.  The coronary arteries are very tortuous suggestive of hypertensive heart disease. 2.  Left ventricular angiography was not performed.  EF was normal by echo. 3.  Severely elevated blood pressure with mildly elevated left ventricular end-diastolic pressure. 4.  Transient left bundle branch block after engaging the left ventricle.  This resolved by the end of the case. 5.  Abdominal aortogram was performed and showed no evidence of renal artery stenosis.  Recommendations: Unfortunately, the LAD stenosis is in a very tortuous segment and at the origin of the third diagonal branch which also has ostial disease.  PCI in this area will be high risk for dissection as well as sidebranch closure especially in the setting of uncontrolled hypertension.  In addition, the distal LAD after the stenosis is diffusely diseased. I recommend maximizing medical therapy and controlling blood pressure before considering PCI.  I added amlodipine to her antihypertensive medications. Other complicating issue is that the patient cannot tolerate aspirin long-term.  If we decide to proceed with PCI at some point, we will have to make sure she tolerates ticagrelor which can likely be used as monotherapy.   Antimicrobials: None   Subjective: Pt up in chair when seen this AM. She reports feeling just awful and says " I need one more day", "I know my body and I don't feel right".  Reports side effects from medications but unable to give specifics.  Reports being far too weak to climb up the three  flights of stairs into her home.  Has not done stairs with PT here. Very concerned as she lives alone without social support.  Discussed longer hospital stay leads to worse weakness and she agrees but reiterates not feeling strong enough to be safe going home today.  Objective: Vitals:   09/23/20 2000 09/23/20 2100 09/24/20 0500 09/24/20 0548  BP:  (!) 118/55  (!) 166/76  Pulse: 68   76  Resp: 18   18  Temp:  98.2 F (36.8 C)  98.5 F (36.9 C)  TempSrc:  Oral  Oral  SpO2:  93%  95%  Weight:   91 kg   Height:        Intake/Output Summary (Last 24 hours) at 09/24/2020 1531 Last data filed at 09/24/2020 0550 Gross per 24 hour  Intake --  Output 1100 ml  Net -1100 ml    Filed Weights   09/16/20 0142 09/23/20 1156 09/24/20 0500  Weight: 90.3 kg 90.7 kg 91 kg   Gen: 75 y.o. female in no distress Pulm: CTAB, no wheezes or rhonchi, normal respiratory effort, on room air CV: RRR. 2/6 systolic murmur, No JVD, no lower extremity edema. GI: Abdomen soft, non-tender, non-distended.  Ext: Warm, no deformities Neuro: Alert and oriented. No focal neurological deficits. Psych: Anxious mood, pressured speech  Data Reviewed: I have personally reviewed following  labs and imaging studies  CBC: Recent Labs  Lab 09/19/20 0512  WBC 8.1  NEUTROABS 4.2  HGB 11.8*  HCT 36.7  MCV 90.2  PLT 274   Basic Metabolic Panel: Recent Labs  Lab 09/18/20 1203 09/19/20 0512 09/20/20 0905 09/21/20 0737 09/23/20 0413  NA 136 138 139 139 136  K 3.4* 3.7 3.8 3.9 3.5  CL 105 108 106 106 104  CO2 22 22 23 25 26   GLUCOSE 124* 109* 129* 113* 117*  BUN 9 10 15 14 15   CREATININE 0.75 0.77 0.82 0.78 0.78  CALCIUM 8.4* 8.9 9.1 8.9 8.5*  MG  --  2.1  --   --   --    Recent Results (from the past 240 hour(s))  SARS CORONAVIRUS 2 (TAT 6-24 HRS) Nasopharyngeal Nasopharyngeal Swab     Status: None   Collection Time: 09/15/20  9:01 PM   Specimen: Nasopharyngeal Swab  Result Value Ref Range Status   SARS  Coronavirus 2 NEGATIVE NEGATIVE Final    Comment: (NOTE) SARS-CoV-2 target nucleic acids are NOT DETECTED.  The SARS-CoV-2 RNA is generally detectable in upper and lower respiratory specimens during the acute phase of infection. Negative results do not preclude SARS-CoV-2 infection, do not rule out co-infections with other pathogens, and should not be used as the sole basis for treatment or other patient management decisions. Negative results must be combined with clinical observations, patient history, and epidemiological information. The expected result is Negative.  Fact Sheet for Patients: HairSlick.nohttps://www.fda.gov/media/138098/download  Fact Sheet for Healthcare Providers: quierodirigir.comhttps://www.fda.gov/media/138095/download  This test is not yet approved or cleared by the [place] FDA and  has been authorized for detection and/or diagnosis of SARS-CoV-2 by FDA under an Emergency Use Authorization (EUA). This EUA will remain  in effect (meaning this test can be used) for the duration of the COVID-19 declaration under Se ction 564(b)(1) of the Act, 21 U.S.C. section 360bbb-3(b)(1), unless the authorization is terminated or revoked sooner.  Performed at [facility], [address] [place], [place] [address]   Culture, Urine     Status: None   Collection Time: 09/16/20  1:27 PM   Specimen: Urine, Clean Catch  Result Value Ref Range Status   Specimen Description URINE, CLEAN CATCH  Final   Special Requests NONE  Final   Culture   Final    NO GROWTH Performed at [facility], [address] [place], [place] [address]    Report Status 09/17/2020 FINAL  Final      Radiology Studies: No results found.  Scheduled Meds:  alum & mag hydroxide-simeth  30 mL Oral Once   amLODipine  10 mg Oral Daily   amoxicillin-clavulanate  1 tablet Oral Q12H   atorvastatin  40 mg Oral Daily   clopidogrel  75 mg Oral Daily   doxazosin  1 mg Oral Daily   enoxaparin (LOVENOX) injection   40 mg Subcutaneous Q24H   fluconazole  100 mg Oral Daily   fluticasone  1 spray Each Nare Daily   hydrALAZINE  75 mg Oral Q8H   isosorbide dinitrate  10 mg Oral TID   losartan  100 mg Oral Daily   nebivolol  2.5 mg Oral q morning   omeprazole  40 mg Oral BID   ranolazine  500 mg Oral BID   sodium chloride flush  3 mL Intravenous Q12H   spironolactone  25 mg Oral Daily   sucralfate  1 g Oral BID  Continuous Infusions:  sodium chloride     sodium chloride 1 mL/kg/hr (09/16/20 1100)   promethazine (PHENERGAN) injection (IM or IVPB) 6.25 mg (09/17/20 2323)     LOS: 8 days   Time spent: 25 minutes.  Pennie Banter, DO [facility] [email or web address] 09/24/2020, 3:31 PM

## 2020-09-24 NOTE — Progress Notes (Signed)
[facility] Gastroenterology Progress Note  Jennifer [name] 75 y.o. [birth year]   Subjective: Heartburn has improved on the Omeprazole 40 mg BID (home dose of 20 mg QD). Tolerating solid food without chest pain. Chest pain improving. Feels better. She is concerned about being able to climb her stairs at her house due to shortness of breath.  Objective: Vital signs: Vitals:   09/23/20 2100 09/24/20 0548  BP: (!) 118/55 (!) 166/76  Pulse:  76  Resp:  18  Temp: 98.2 F (36.8 C) 98.5 F (36.9 C)  SpO2: 93% 95%    Physical Exam: Gen: alert, no acute distress, elderly, pleasant  HEENT: anicteric sclera CV: RRR Chest: CTA B Abd: soft, nontender, nondistended, +BS Ext: no edema  Lab Results: Recent Labs    09/23/20 0413  NA 136  K 3.5  CL 104  CO2 26  GLUCOSE 117*  BUN 15  CREATININE 0.78  CALCIUM 8.5*   No results for input(s): AST, ALT, ALKPHOS, BILITOT, PROT, ALBUMIN in the last 72 hours. No results for input(s): WBC, NEUTROABS, HGB, HCT, MCV, PLT in the last 72 hours.    Assessment/Plan: GERD - better controlled on higher dose Omeprazole 40 mg BID. Continue on that dosing until f/u with Dr. Matthias [name] later this month. Continue sucralfate BID. No further GI recs. Defer frequency of PT/OT to primary team for discharge planning. Will sign off. Call if questions.   Jennifer [name] 09/24/2020, 11:41 AM  Questions please call [phone] Patient ID: Jennifer [name], female   DOB: September 02, 1945, 75 y.o.   MRN: [id number]

## 2020-09-24 NOTE — Care Management (Signed)
1041 09-24-20 Case Manager spoke with patient, she currently lives in an apartment on the 3rd floor without an elevator. Patient states she is without a car and has to borrow her friends car vs. Doctor, general practice. Case Manager sent an ambulatory referral for outpatient Physical Therapy. Patient is aware that she needs to call [facility] to schedule an outpatient visit. No further needs from Case Manager at this time.

## 2020-09-24 NOTE — Progress Notes (Signed)
Mobility Specialist: Progress Note   09/24/20 1742  Mobility  Activity Ambulated in hall  Level of Assistance Standby assist, set-up cues, supervision of patient - no hands on  Assistive Device None  Distance Ambulated (ft) 330 ft  Mobility Ambulated independently in hallway  Mobility Response Tolerated well  Mobility performed by Mobility specialist  Bed Position Chair  $Mobility charge 1 Mobility   Post-Mobility: 67 HR, 183/56 BP  Pt stopped for two brief standing breaks d/t SOB, otherwise asx. Pt back to recliner after walk with call bell in reach.   [facility] Lian Tanori Mobility Specialist Mobility Specialist Phone: [phone]

## 2020-09-24 NOTE — Progress Notes (Signed)
Physical Therapy Treatment Patient Details Name: Jennifer [name] MRN: [id number] DOB: 1945/10/13 Today's Date: 09/24/2020    History of Present Illness Pt is a 75 y.o. female admitted 09/15/20 with c/o chest pain. Workup for hypertensive urgency. Workup revealed significant stenosis in LAD, not amenable to PCI. PMH includes HTN, HLD, insterstitial cystitis requiring intermittent self-cath, lumbar stenosis, chronic pain, fibromyalgia.    PT Comments    Patient negotiating a total of 28 stairs in preparation to return home to her 3rd floor apartment. Patient with standing rest break every 8-10 stairs due to SOB with HR max 90. Patient demos significant endurance deficits with SOB during short ambulation distance initially (50'). Educated patient on energy conservation during mobility with limiting conversation as patient is verbose throughout. Intermittent follow through. D/c plan remains appropriate.     Follow Up Recommendations  Outpatient PT     Equipment Recommendations  None recommended by PT    Recommendations for Other Services       Precautions / Restrictions Precautions Precautions: Fall Restrictions Weight Bearing Restrictions: No    Mobility  Bed Mobility               General bed mobility comments: in recliner    Transfers Overall transfer level: Independent Equipment used: None                Ambulation/Gait Ambulation/Gait assistance: Independent Gait Distance (Feet): 200 Feet Assistive device: None Gait Pattern/deviations: Step-through pattern;Decreased stride length;Trunk flexed Gait velocity: Decreased   General Gait Details: slow, fatiguing gait. SOB throughout   Stairs Stairs: Yes Stairs assistance: Modified independent (Device/Increase time) Stair Management: One rail Right;Step to pattern;Forwards Number of Stairs: 10 (x10, x8) General stair comments: Standing rest break between each flight. HR max 90. Increased time to perform  due to hx of knee pain. No physical assistance needed.   Wheelchair Mobility    Modified Rankin (Stroke Patients Only)       Balance Overall balance assessment: No apparent balance deficits (not formally assessed)                                          Cognition Arousal/Alertness: Awake/alert Behavior During Therapy: WFL for tasks assessed/performed Overall Cognitive Status: Within Functional Limits for tasks assessed                                 General Comments: very verbose throughout session after being educated about energy conservation and limiting conversation      Exercises      General Comments        Pertinent Vitals/Pain Pain Assessment: Faces Faces Pain Scale: Hurts a little bit Pain Location: Generalized Pain Descriptors / Indicators: Tiring Pain Intervention(s): Monitored during session    Home Living                      Prior Function            PT Goals (current goals can now be found in the care plan section) Acute Rehab PT Goals Patient Stated Goal: get up the stairs at my apartment PT Goal Formulation: With patient Time For Goal Achievement: 10/04/20 Potential to Achieve Goals: Good Progress towards PT goals: Progressing toward goals    Frequency    Min 3X/week  PT Plan Current plan remains appropriate    Co-evaluation              AM-PAC PT "6 Clicks" Mobility   Outcome Measure  Help needed turning from your back to your side while in a flat bed without using bedrails?: None Help needed moving from lying on your back to sitting on the side of a flat bed without using bedrails?: None Help needed moving to and from a bed to a chair (including a wheelchair)?: None Help needed standing up from a chair using your arms (e.g., wheelchair or bedside chair)?: None Help needed to walk in hospital room?: None Help needed climbing 3-5 steps with a railing? : None 6 Click Score:  24    End of Session   Activity Tolerance: Patient tolerated treatment well Patient left: in chair;with call bell/phone within reach Nurse Communication: Mobility status PT Visit Diagnosis: Other abnormalities of gait and mobility (R26.89);Pain     Time: 1530-1601 PT Time Calculation (min) (ACUTE ONLY): 31 min  Charges:  $Therapeutic Activity: 23-37 mins                     Jennifer [name] A. Dan Humphreys PT, DPT Acute Rehabilitation Services Pager [phone] Office [phone]    Jennifer [name] 09/24/2020, 5:17 PM

## 2020-09-24 NOTE — Progress Notes (Addendum)
Progress Note  Patient Name: Jennifer [name] Date of Encounter: 09/24/2020  Primary Cardiologist: Christell Constant, MD   Subjective   Still feels she is swelling, but agrees that her legs do not have any fluid and her oxygen levels have been good.  C/o burning CP w/ exertion, admits that she has this after taking meds as well.  Eats a little before taking meds, not really taking them on an empty stomach.  C/o malaise and general fatigue, feels these are side effects from the meds but admits the sx are better after med adjustments.  Gets SOB w/ exertion, admits that she could be deconditioned from her hospital stay  Inpatient Medications    Scheduled Meds:  alum & mag hydroxide-simeth  30 mL Oral Once   amLODipine  10 mg Oral Daily   amoxicillin-clavulanate  1 tablet Oral Q12H   atorvastatin  40 mg Oral Daily   clopidogrel  75 mg Oral Daily   doxazosin  1 mg Oral Daily   enoxaparin (LOVENOX) injection  40 mg Subcutaneous Q24H   fluconazole  100 mg Oral Daily   fluticasone  1 spray Each Nare Daily   hydrALAZINE  75 mg Oral Q8H   isosorbide dinitrate  10 mg Oral TID   losartan  100 mg Oral Daily   nebivolol  2.5 mg Oral q morning   omeprazole  40 mg Oral BID   ranolazine  500 mg Oral BID   sodium chloride flush  3 mL Intravenous Q12H   spironolactone  25 mg Oral Daily   sucralfate  1 g Oral BID   Continuous Infusions:  sodium chloride     sodium chloride 1 mL/kg/hr (09/16/20 1100)   promethazine (PHENERGAN) injection (IM or IVPB) 6.25 mg (09/17/20 2323)   PRN Meds: sodium chloride, acetaminophen, alum & mag hydroxide-simeth **AND** [COMPLETED] lidocaine, hydrALAZINE, HYDROcodone-acetaminophen, magnesium hydroxide, nitroGLYCERIN, promethazine (PHENERGAN) injection (IM or IVPB), sodium chloride flush   Vital Signs    Vitals:   09/23/20 2000 09/23/20 2100 09/24/20 0500 09/24/20 0548  BP:  (!) 118/55  (!) 166/76  Pulse: 68   76  Resp: 18   18  Temp:   98.2 F (36.8 C)  98.5 F (36.9 C)  TempSrc:  Oral  Oral  SpO2:  93%  95%  Weight:   91 kg   Height:        Intake/Output Summary (Last 24 hours) at 09/24/2020 1055 Last data filed at 09/24/2020 0550 Gross per 24 hour  Intake --  Output 1100 ml  Net -1100 ml   Filed Weights   09/16/20 0142 09/23/20 1156 09/24/20 0500  Weight: 90.3 kg 90.7 kg 91 kg    Physical Exam   General: Well developed, well nourished, female in no acute distress Head: Eyes PERRLA, Head normocephalic and atraumatic Lungs: clear bilaterally to auscultation. Heart: HRRR S1 S2, without rub or gallop. 2/6 murmur. 4/4 extremity pulses are 2+ & equal. No JVD. Abdomen: Bowel sounds are present, abdomen soft and non-tender without masses or  hernias noted. Msk: Normal strength and tone for age. Extremities: No clubbing, cyanosis or edema.    Skin:  No rashes or lesions noted. Neuro: Alert and oriented X 3. Psych:  Good affect, responds appropriately  Labs    Chemistry Recent Labs  Lab 09/20/20 0905 09/21/20 0737 09/23/20 0413  NA 139 139 136  K 3.8 3.9 3.5  CL 106 106 104  CO2 23 25 26   GLUCOSE  129* 113* 117*  BUN 15 14 15   CREATININE 0.82 0.78 0.78  CALCIUM 9.1 8.9 8.5*  GFRNONAA >60 >60 >60  ANIONGAP 10 8 6      Hematology Recent Labs  Lab 09/19/20 0512  WBC 8.1  RBC 4.07  HGB 11.8*  HCT 36.7  MCV 90.2  MCH 29.0  MCHC 32.2  RDW 13.5  PLT 274   No results found for: HGBA1C Lab Results  Component Value Date   TSH 3.053 09/16/2020   No results found for: CHOL, HDL, LDLCALC, LDLDIRECT, TRIG, CHOLHDL  Cardiac EnzymesNo results for input(s): TROPONINI in the last 168 hours. No results for input(s): TROPIPOC in the last 168 hours.   BNPNo results for input(s): BNP, PROBNP in the last 168 hours.   DDimer No results for input(s): DDIMER in the last 168 hours.   Radiology    No results found.  Telemetry   SR - Personally Reviewed  ECG    No new tracing as of 09/23/20 -  Personally Reviewed  Cardiac Studies   LHC 09/16/20:   Mid LAD lesion is 50% stenosed. 3rd Diag lesion is 60% stenosed. Dist LAD-1 lesion is 90% stenosed. Dist LAD-2 lesion is 40% stenosed.   1.  Significant one-vessel coronary artery disease involving the mid to distal LAD at the bifurcation of the third diagonal which has also 60% ostial stenosis.  The coronary arteries are very tortuous suggestive of hypertensive heart disease. 2.  Left ventricular angiography was not performed.  EF was normal by echo. 3.  Severely elevated blood pressure with mildly elevated left ventricular end-diastolic pressure. 4.  Transient left bundle branch block after engaging the left ventricle.  This resolved by the end of the case. 5.  Abdominal aortogram was performed and showed no evidence of renal artery stenosis.   Recommendations: Unfortunately, the LAD stenosis is in a very tortuous segment and at the origin of the third diagonal branch which also has ostial disease.  PCI in this area will be high risk for dissection as well as sidebranch closure especially in the setting of uncontrolled hypertension.  In addition, the distal LAD after the stenosis is diffusely diseased. I recommend maximizing medical therapy and controlling blood pressure before considering PCI.  I added amlodipine to her antihypertensive medications. Other complicating issue is that the patient cannot tolerate aspirin long-term.  If we decide to proceed with PCI at some point, we will have to make sure she tolerates ticagrelor which can likely be used as monotherapy.      Diagnostic Dominance: Right      Intervention       Echocardiogram 09/15/20:   1. Left ventricular ejection fraction, by estimation, is 60 to 65%. The  left ventricle has normal function. The left ventricle has no regional  wall motion abnormalities. There is moderate left ventricular hypertrophy.  Left ventricular diastolic  parameters are consistent with  Grade I diastolic dysfunction (impaired  relaxation). Elevated left atrial pressure. The average left ventricular  global longitudinal strain is -20.4 %. The global longitudinal strain is  normal.   2. Right ventricular systolic function is normal. The right ventricular  size is normal.   3. The mitral valve is normal in structure. Trivial mitral valve  regurgitation. No evidence of mitral stenosis.   4. The aortic valve is tricuspid. Aortic valve regurgitation is not  visualized. Mild aortic valve stenosis.   5. Aortic dilatation noted. There is mild dilatation of the ascending  aorta, measuring 43  mm.   6. The inferior vena cava is normal in size with greater than 50%  respiratory variability, suggesting right atrial pressure of 3 mmHg.   Comparison(s): 07/11/17 EF 60-65%. PA pressure .  Patient Profile     75 y.o. female with a hx of hypertension, PVC, HLD, chronic pain syndrome with lumbar stenosis was admitted after she went to the office w/ CP and SBP 260 >> sent to ER >> cardiac CT w/ LAD stenosis >> admitted for cath.  Assessment & Plan    Chest pain: - CCTA w/ LAD dz >> cath results above - Dr Katrinka Blazing reviewed the films. due to tortuosity of the LAD and D3 branch, PCI will likely be complicated by an ischemic event due to either to coronary dissection and or sidebranch occlusion.  Tortuosity may make it difficult to even cross the stenosis.  PCI would likely cause more harm than good.  Recommend medical therapy. - she is on Isordil (do not use 20 mg tabs due to dye allergy), with dose decreased 2nd side effects - GI issues w/ ASA, on Plavix - on Ranexa 500 mg bid, no dose increase due to interaction w/ fluconazole - continue nevibolol, dose decreased due to bradycardia - continue to increase ambulation and monitor for symptoms  2. HTN: - improved w/ Rx changes, meds greatly limited by yellow dye and other allergies (18 total) -  at 4:30 am yesterday VS were 133/64 >>  141/66 >> 117/57 >> 118/55 >> 166/76 - hydralazine and isordil decreased  - feels a little better today - discussed need to take meds on a full stomach if possible - discussed need to tolerate some side effects which may improve w/ time, since med selection is so limited and she feels better than she did when BP was not controlled - still c/o feeling swollen, reviewed the reasons I do not think she has extra volume on board - continue spiro, can use Lasix prn for weight gain if needed   3. Mild AS: - echo this admit w/ peak gradient 20 -continue statin - do not get dehydrated   4. Aortic root dilatation: - 43 mm asc Ao - follow as outpt  5. 18 med allergies/intolerances - Some of these were acquired during prolonged ABX use in the 1980s - suggested she see an allergist to determine what she is actually allergic to now    Signed, Theodore Demark, PA-C 09/24/2020 11:16 AM   For questions or updates, please contact   Please consult [email or web address] for contact info under Cardiology/STEMI.  Patient seen and examined.  Agree with above documentation.  On exam, patient is alert and oriented, regular rate and rhythm, 2/6 systolic murmur, lungs CTAB, no LE edema.  Telemetry personally reviewed, shows sinus rhythm, rate 70s.  BP improved, continue current meds.  Continues to have significant fatigue.  Little Ishikawa, MD

## 2020-09-24 NOTE — Progress Notes (Signed)
PT Cancellation Note  Patient Details Name: Jennifer [name] MRN: [id number] DOB: 05-19-1945   Cancelled Treatment:    Reason Eval/Treat Not Completed: Patient declined, no reason specified Patient refused mobility as patient recently finished bathing, waiting on lunch, and wanting RN to place telemetry back on. Patient requested for PT to return later in afternoon. PT will re-attempt as time allows.   Mellody Masri A. Dan Humphreys PT, DPT Acute Rehabilitation Services Pager [phone] Office [phone]    Viviann Spare 09/24/2020, 1:11 PM

## 2020-09-25 ENCOUNTER — Other Ambulatory Visit: Payer: Self-pay

## 2020-09-25 DIAGNOSIS — I455 Other specified heart block: Secondary | ICD-10-CM

## 2020-09-25 LAB — CBC
HCT: 31.9 % — ABNORMAL LOW (ref 36.0–46.0)
Hemoglobin: 10.1 g/dL — ABNORMAL LOW (ref 12.0–15.0)
MCH: 28.5 pg (ref 26.0–34.0)
MCHC: 31.7 g/dL (ref 30.0–36.0)
MCV: 90.1 fL (ref 80.0–100.0)
Platelets: 303 10*3/uL (ref 150–400)
RBC: 3.54 MIL/uL — ABNORMAL LOW (ref 3.87–5.11)
RDW: 13.4 % (ref 11.5–15.5)
WBC: 5.2 10*3/uL (ref 4.0–10.5)
nRBC: 0 % (ref 0.0–0.2)

## 2020-09-25 LAB — COMPREHENSIVE METABOLIC PANEL
ALT: 22 U/L (ref 0–44)
AST: 23 U/L (ref 15–41)
Albumin: 3.5 g/dL (ref 3.5–5.0)
Alkaline Phosphatase: 51 U/L (ref 38–126)
Anion gap: 7 (ref 5–15)
BUN: 16 mg/dL (ref 8–23)
CO2: 26 mmol/L (ref 22–32)
Calcium: 8.6 mg/dL — ABNORMAL LOW (ref 8.9–10.3)
Chloride: 103 mmol/L (ref 98–111)
Creatinine, Ser: 0.84 mg/dL (ref 0.44–1.00)
GFR, Estimated: >60 mL/min (ref >60)
Glucose, Bld: 110 mg/dL — ABNORMAL HIGH (ref 70–99)
Potassium: 4 mmol/L (ref 3.5–5.1)
Sodium: 136 mmol/L (ref 135–145)
Total Bilirubin: 0.6 mg/dL (ref 0.3–1.2)
Total Protein: 6.1 g/dL — ABNORMAL LOW (ref 6.5–8.1)

## 2020-09-25 LAB — TROPONIN I (HIGH SENSITIVITY): Troponin I (High Sensitivity): 10 ng/L (ref <18)

## 2020-09-25 LAB — BRAIN NATRIURETIC PEPTIDE: B Natriuretic Peptide: 106.8 pg/mL — ABNORMAL HIGH (ref 0.0–100.0)

## 2020-09-25 MED ORDER — NITROGLYCERIN IN D5W 200-5 MCG/ML-% IV SOLN
2.0000 ug/min | INTRAVENOUS | Status: DC
Start: 2020-09-25 — End: 2020-09-26
  Administered 2020-09-25: 5 ug/min via INTRAVENOUS
  Filled 2020-09-25: qty 250

## 2020-09-25 MED ORDER — NIFEDIPINE 10 MG PO CAPS
10.0000 mg | ORAL_CAPSULE | Freq: Three times a day (TID) | ORAL | Status: AC
  Administered 2020-09-25 – 2020-09-26 (×6): 10 mg via ORAL
  Filled 2020-09-25 (×7): qty 1

## 2020-09-25 NOTE — Progress Notes (Signed)
Mobility Specialist: Progress Note   09/25/20 1711  Mobility  Activity Ambulated in room  Level of Assistance Independent  Assistive Device None  Distance Ambulated (ft) 200 ft  Mobility Ambulated independently in room  Mobility Response Tolerated well  Mobility performed by Mobility specialist  Bed Position Chair  $Mobility charge 1 Mobility   Post-Mobility: 72 HR, 141/55 BP  Pt to BR and then agreeable to ambulate in the room. Pt endorsed chest pain/burning she said has been happening all [name] and through last night, otherwise asx. Pt to recliner after walk with call bell in reach.   [facility] Jennifer [name] Mobility Specialist Mobility Specialist Phone: [phone]

## 2020-09-25 NOTE — Progress Notes (Signed)
PROGRESS NOTE  Jennifer [name]  RWE:[id number] DOB: September 11, 1945 DOA: 09/15/2020 PCP: [facility]   Brief Narrative: Jennifer [name] is a 75 y.o. female with a history of resistant HTN, HLD, GERD, interstitial cystitis requiring intermittent self-catheterization, lumbar stenosis causing chronic pain who was sent to the ED by cardiology due to chest discomfort and HTN urgency on 6/16. Blood pressure has been labile but overall improved.   Assessment & Plan: Principal Problem:   Hypertensive urgency Active Problems:   GERD (gastroesophageal reflux disease)   Chest pain   Interstitial cystitis (chronic) without hematuria   Chronic low back pain   Chronic diastolic CHF (congestive heart failure) (HCC)   Unstable angina (HCC)   Hypertensive crisis  Hypertensive emergency, resistant HTN: SBP up to 260 with chest pain at [facility].  - Abdominal aortogram at time of cath did not reveal renal artery stenosis.  - Aldo + renin w/ratio lab checked 6/19 (still not resulted). - Other symptoms not consistent with pheochromocytoma, though dedicated work up could be pursued as an outpatient.  - Cardiology managing antihypertensives: Isordil, ranexa, bystolic, norvasc, losartan, doxazosin, hydralazine, spironolactone (increase dose today). - Ranexa dose limited by fluconazole interaction. - Monitor Cr and K in AM.  CAD: CT FFR revealed significant functional stenosis in LAD.  - Continue BP control as above - Continue statin, beta blocker - Lesion not amenable to PCI (tortuosity, extensive disease distal to stenosis). Multiple opinions sought with consistent fear that risk of cardiac event (coronary dissection and/or occlusion of sidebranch) is much more likely than any benefit that may be derived from attempted PCI. Hopeful for optimization of medical therapy and patient observation for development of collaterals.  Chest pain: Multifactorial. With known LAD lesion, angina  may be at play, multiple troponin's negative and unchanged ECG.  Little to no improvement with NTG. GERD is certainly contributing. ?if pill burden worsening esophagitis, though not benefiting from GI cocktail very either. Strongly suspect anxiety may be an increasingly prominent contributor to this and other symptoms (e.g. subjective swelling without weight gain or edema on exam) 6/26 AM - continuous severe CP since 1 AM - Continue cardiac and GERD medications, both scheduled and prn.  - Due to pt's concern for polypharmacy, not amenable to anxiolytic at this time.   ?Esophgeal spasms due to GERD?  --trial with nifedipine instead of amlodipine --GI may consider manometry to evaluate  GERD: ?if this is contributing to ongoing chest complaints, particularly atypical features.  - Pt's GI is Dr. Matthias Hughs - consulted, recommended augmenting PPI, continuing Carafate.  No plans for EGD as inpatient.  Chronic HFpEF: LVEF 60-65%, no WMA, mod LVH, G1DD on echo. Appears stable volumewise.  - No diuretic indicated, monitor - Daily weights to monitor - Dietitian consult  Lumbar stenosis, chronic pain:  - Continue home medications (norco)  Chronic interstitial cystitis:  - No changes to management currently.  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  Cutaneous candidiasis:  - Continue fluconazole  Obesity: Estimated body mass index is 33.33 kg/m as calculated from the following:   Height as of this encounter: 5\' 5"  (1.651 m).   Weight as of this encounter: 90.9 kg.  DVT prophylaxis: Lovenox Code Status: Full Family Communication: None at bedside Disposition Plan:  Status is: Inpatient  Remains inpatient appropriate because:Ongoing diagnostic testing needed not appropriate for outpatient work up and Inpatient level of care appropriate due to  severity of illness   Unsafe d/c due to weakness and has 3 flights stairs to enter home;  requires evaluation of tolerance / safety navigating stairs.  Dispo: The patient is from: Home              Anticipated d/c is to: Home, likely 6/25.              Patient currently is not medically stable to d/c.   Difficult to place patient No  Consultants:  Cardiology GI  Procedures:  LHC 09/16/20:  Mid LAD lesion is 50% stenosed. 3rd Diag lesion is 60% stenosed. Dist LAD-1 lesion is 90% stenosed. Dist LAD-2 lesion is 40% stenosed.  1.  Significant one-vessel coronary artery disease involving the mid to distal LAD at the bifurcation of the third diagonal which has also 60% ostial stenosis.  The coronary arteries are very tortuous suggestive of hypertensive heart disease. 2.  Left ventricular angiography was not performed.  EF was normal by echo. 3.  Severely elevated blood pressure with mildly elevated left ventricular end-diastolic pressure. 4.  Transient left bundle branch block after engaging the left ventricle.  This resolved by the end of the case. 5.  Abdominal aortogram was performed and showed no evidence of renal artery stenosis.  Recommendations: Unfortunately, the LAD stenosis is in a very tortuous segment and at the origin of the third diagonal branch which also has ostial disease.  PCI in this area will be high risk for dissection as well as sidebranch closure especially in the setting of uncontrolled hypertension.  In addition, the distal LAD after the stenosis is diffusely diseased. I recommend maximizing medical therapy and controlling blood pressure before considering PCI.  I added amlodipine to her antihypertensive medications. Other complicating issue is that the patient cannot tolerate aspirin long-term.  If we decide to proceed with PCI at some point, we will have to make sure she tolerates ticagrelor which can likely be used as monotherapy.   Antimicrobials: None   Subjective: Pt awake laying in bed this AM.  Reports ffeeling like her whole body is burning and  has had unrelenting chest pain since about 1 AM, central to left-sided.  Not relieved by SL nitro, GI cocktail or pain medication.  No N/V or SOB.  Reiterates there is no way she can go home feeling like this because she would end up coming right back.  Objective: Vitals:   09/25/20 1414 09/25/20 1428 09/25/20 1437 09/25/20 1444  BP: (!) 112/56 (!) 117/51 (!) 117/51 124/63  Pulse:      Resp:      Temp:      TempSrc:      SpO2:      Weight:      Height:        Intake/Output Summary (Last 24 hours) at 09/25/2020 1626 Last data filed at 09/25/2020 1133 Gross per 24 hour  Intake 1040.17 ml  Output 1675 ml  Net -634.83 ml    Filed Weights   09/23/20 1156 09/24/20 0500 09/25/20 0500  Weight: 90.7 kg 91 kg 90.9 kg   Gen: alert, awake, no acute distress Pulm: normal respiratory effort, on room air CV: RRR. 2/6 systolic murmur, no lower extremity edema. GI: Abdomen soft, non-tender, non-distended.  Ext: Warm, no deformities, moves all Neuro: Alert and oriented. Normal speech. No focal neurological deficits. Psych: Anxious mood, pressured speech  Data Reviewed: I have personally reviewed following labs and imaging studies  CBC: Recent Labs  Lab 09/19/20 0512  09/25/20 0924  WBC 8.1 5.2  NEUTROABS 4.2  --   HGB 11.8* 10.1*  HCT 36.7 31.9*  MCV 90.2 90.1  PLT 274 303   Basic Metabolic Panel: Recent Labs  Lab 09/19/20 0512 09/20/20 0905 09/21/20 0737 09/23/20 0413 09/25/20 0924  NA 138 139 139 136 136  K 3.7 3.8 3.9 3.5 4.0  CL 108 106 106 104 103  CO2 22 23 25 26 26   GLUCOSE 109* 129* 113* 117* 110*  BUN 10 15 14 15 16   CREATININE 0.77 0.82 0.78 0.78 0.84  CALCIUM 8.9 9.1 8.9 8.5* 8.6*  MG 2.1  --   --   --   --    Recent Results (from the past 240 hour(s))  SARS CORONAVIRUS 2 (TAT 6-24 HRS) Nasopharyngeal Nasopharyngeal Swab     Status: None   Collection Time: 09/15/20  9:01 PM   Specimen: Nasopharyngeal Swab  Result Value Ref Range Status   SARS Coronavirus  2 NEGATIVE NEGATIVE Final    Comment: (NOTE) SARS-CoV-2 target nucleic acids are NOT DETECTED.  The SARS-CoV-2 RNA is generally detectable in upper and lower respiratory specimens during the acute phase of infection. Negative results do not preclude SARS-CoV-2 infection, do not rule out co-infections with other pathogens, and should not be used as the sole basis for treatment or other patient management decisions. Negative results must be combined with clinical observations, patient history, and epidemiological information. The expected result is Negative.  Fact Sheet for Patients: HairSlick.nohttps://www.fda.gov/media/138098/download  Fact Sheet for Healthcare Providers: quierodirigir.comhttps://www.fda.gov/media/138095/download  This test is not yet approved or cleared by the [place] FDA and  has been authorized for detection and/or diagnosis of SARS-CoV-2 by FDA under an Emergency Use Authorization (EUA). This EUA will remain  in effect (meaning this test can be used) for the duration of the COVID-19 declaration under Se ction 564(b)(1) of the Act, 21 U.S.C. section 360bbb-3(b)(1), unless the authorization is terminated or revoked sooner.  Performed at [facility], [address] [place], [place] [address]   Culture, Urine     Status: None   Collection Time: 09/16/20  1:27 PM   Specimen: Urine, Clean Catch  Result Value Ref Range Status   Specimen Description URINE, CLEAN CATCH  Final   Special Requests NONE  Final   Culture   Final    NO GROWTH Performed at [facility], [address] [place], [place] [address]    Report Status 09/17/2020 FINAL  Final      Radiology Studies: No results found.  Scheduled Meds:  alum & mag hydroxide-simeth  30 mL Oral Once   atorvastatin  40 mg Oral Daily   clopidogrel  75 mg Oral Daily   doxazosin  1 mg Oral Daily   enoxaparin (LOVENOX) injection  40 mg Subcutaneous Q24H   fluconazole  100 mg Oral Daily   fluticasone  1 spray Each  Nare Daily   hydrALAZINE  75 mg Oral Q8H   isosorbide dinitrate  10 mg Oral TID   losartan  100 mg Oral Daily   NIFEdipine  10 mg Oral Q8H   omeprazole  40 mg Oral BID   ranolazine  500 mg Oral BID   sodium chloride flush  3 mL Intravenous Q12H   spironolactone  25 mg Oral Daily   sucralfate  1 g Oral BID   Continuous Infusions:  sodium chloride     sodium chloride 1 mL/kg/hr (09/16/20 1100)   nitroGLYCERIN 5  mcg/min (09/25/20 1622)   promethazine (PHENERGAN) injection (IM or IVPB) 6.25 mg (09/17/20 2323)     LOS: 9 days   Time spent: 35 minutes with > 50% spent at bedside and in coordination of care.  Pennie Banter, DO [facility] [email or web address] 09/25/2020, 4:26 PM

## 2020-09-25 NOTE — Progress Notes (Signed)
Pt c/o 10/10 CP, unrelieved by nitro X2, hydrocodone, and GI cocktail. Pt did have a 3 second pause alerted by CCMD, without any symptoms. Vitals have remained stable. Paged Cardiology to update. Will continue to monitor.

## 2020-09-25 NOTE — Progress Notes (Addendum)
Progress Note  Patient Name: Jennifer [name] Date of Encounter: 09/25/2020  Primary Cardiologist: Christell Constant, MD   Subjective   Unremitting chest pain overnight, started on nitroglycerin drip.  BP 155/65 this morning, P 64.  Continues to report burning chest pain on the left side of her chest.  Reports has been continuous since around 1 AM last night.  Reports minimal relief with nitroglycerin.  Reports no improvement with GI cocktail.  Reports whole body feels like it is burning but particularly on left side of chest.  Inpatient Medications    Scheduled Meds:  alum & mag hydroxide-simeth  30 mL Oral Once   amLODipine  10 mg Oral Daily   amoxicillin-clavulanate  1 tablet Oral Q12H   atorvastatin  40 mg Oral Daily   clopidogrel  75 mg Oral Daily   doxazosin  1 mg Oral Daily   enoxaparin (LOVENOX) injection  40 mg Subcutaneous Q24H   fluconazole  100 mg Oral Daily   fluticasone  1 spray Each Nare Daily   hydrALAZINE  75 mg Oral Q8H   isosorbide dinitrate  10 mg Oral TID   losartan  100 mg Oral Daily   nebivolol  2.5 mg Oral q morning   omeprazole  40 mg Oral BID   ranolazine  500 mg Oral BID   sodium chloride flush  3 mL Intravenous Q12H   spironolactone  25 mg Oral Daily   sucralfate  1 g Oral BID   Continuous Infusions:  sodium chloride     sodium chloride 1 mL/kg/hr (09/16/20 1100)   nitroGLYCERIN 25 mcg/min (09/25/20 0629)   promethazine (PHENERGAN) injection (IM or IVPB) 6.25 mg (09/17/20 2323)   PRN Meds: sodium chloride, acetaminophen, alum & mag hydroxide-simeth **AND** [COMPLETED] lidocaine, hydrALAZINE, HYDROcodone-acetaminophen, magnesium hydroxide, nitroGLYCERIN, promethazine (PHENERGAN) injection (IM or IVPB), sodium chloride flush   Vital Signs    Vitals:   09/25/20 0500 09/25/20 0600 09/25/20 0615 09/25/20 0744  BP:  (!) 153/75 139/64 (!) 155/65  Pulse:      Resp:      Temp:      TempSrc:      SpO2:      Weight: 90.9 kg     Height:         Intake/Output Summary (Last 24 hours) at 09/25/2020 3762 Last data filed at 09/25/2020 8315 Gross per 24 hour  Intake 302.34 ml  Output 800 ml  Net -497.66 ml    Filed Weights   09/23/20 1156 09/24/20 0500 09/25/20 0500  Weight: 90.7 kg 91 kg 90.9 kg    Physical Exam   General: Well developed, well nourished, female in no acute distress Head: Eyes PERRLA, Head normocephalic and atraumatic Lungs: clear bilaterally to auscultation. Heart: HRRR S1 S2, without rub or gallop. 2/6 murmur. 4/4 extremity pulses are 2+ & equal. No JVD. Abdomen: Bowel sounds are present, abdomen soft and non-tender without masses or  hernias noted. Msk: Normal strength and tone for age. Extremities: No clubbing, cyanosis or edema.    Skin:  No rashes or lesions noted. Neuro: Alert and oriented X 3. Psych:  Good affect, responds appropriately  Labs    Chemistry Recent Labs  Lab 09/20/20 0905 09/21/20 0737 09/23/20 0413  NA 139 139 136  K 3.8 3.9 3.5  CL 106 106 104  CO2 23 25 26   GLUCOSE 129* 113* 117*  BUN 15 14 15   CREATININE 0.82 0.78 0.78  CALCIUM 9.1 8.9 8.5*  GFRNONAA >60 >60 >60  ANIONGAP 10 8 6       Hematology Recent Labs  Lab 09/19/20 0512  WBC 8.1  RBC 4.07  HGB 11.8*  HCT 36.7  MCV 90.2  MCH 29.0  MCHC 32.2  RDW 13.5  PLT 274    No results found for: HGBA1C Lab Results  Component Value Date   TSH 3.053 09/16/2020   No results found for: CHOL, HDL, LDLCALC, LDLDIRECT, TRIG, CHOLHDL  Cardiac EnzymesNo results for input(s): TROPONINI in the last 168 hours. No results for input(s): TROPIPOC in the last 168 hours.   BNPNo results for input(s): BNP, PROBNP in the last 168 hours.   DDimer No results for input(s): DDIMER in the last 168 hours.   Radiology    No results found.  Telemetry   SR.  Sinus pauses of 3.2 seconds and 2.6 seconds this morning- Personally Reviewed  ECG    No new tracing as of 09/23/20 - Personally Reviewed  Cardiac Studies    LHC 09/16/20:   Mid LAD lesion is 50% stenosed. 3rd Diag lesion is 60% stenosed. Dist LAD-1 lesion is 90% stenosed. Dist LAD-2 lesion is 40% stenosed.   1.  Significant one-vessel coronary artery disease involving the mid to distal LAD at the bifurcation of the third diagonal which has also 60% ostial stenosis.  The coronary arteries are very tortuous suggestive of hypertensive heart disease. 2.  Left ventricular angiography was not performed.  EF was normal by echo. 3.  Severely elevated blood pressure with mildly elevated left ventricular end-diastolic pressure. 4.  Transient left bundle branch block after engaging the left ventricle.  This resolved by the end of the case. 5.  Abdominal aortogram was performed and showed no evidence of renal artery stenosis.   Recommendations: Unfortunately, the LAD stenosis is in a very tortuous segment and at the origin of the third diagonal branch which also has ostial disease.  PCI in this area will be high risk for dissection as well as sidebranch closure especially in the setting of uncontrolled hypertension.  In addition, the distal LAD after the stenosis is diffusely diseased. I recommend maximizing medical therapy and controlling blood pressure before considering PCI.  I added amlodipine to her antihypertensive medications. Other complicating issue is that the patient cannot tolerate aspirin long-term.  If we decide to proceed with PCI at some point, we will have to make sure she tolerates ticagrelor which can likely be used as monotherapy.      Diagnostic Dominance: Right      Intervention       Echocardiogram 09/15/20:   1. Left ventricular ejection fraction, by estimation, is 60 to 65%. The  left ventricle has normal function. The left ventricle has no regional  wall motion abnormalities. There is moderate left ventricular hypertrophy.  Left ventricular diastolic  parameters are consistent with Grade I diastolic dysfunction (impaired   relaxation). Elevated left atrial pressure. The average left ventricular  global longitudinal strain is -20.4 %. The global longitudinal strain is  normal.   2. Right ventricular systolic function is normal. The right ventricular  size is normal.   3. The mitral valve is normal in structure. Trivial mitral valve  regurgitation. No evidence of mitral stenosis.   4. The aortic valve is tricuspid. Aortic valve regurgitation is not  visualized. Mild aortic valve stenosis.   5. Aortic dilatation noted. There is mild dilatation of the ascending  aorta, measuring 43 mm.   6. The inferior vena  cava is normal in size with greater than 50%  respiratory variability, suggesting right atrial pressure of 3 mmHg.   Comparison(s): 07/11/17 EF 60-65%. PA pressure .  Patient Profile     75 y.o. female with a hx of hypertension, PVC, HLD, chronic pain syndrome with lumbar stenosis was admitted after she went to the office w/ CP and SBP 260 >> sent to ER >> cardiac CT w/ LAD stenosis >> admitted for cath.  Assessment & Plan    Chest pain: - CCTA w/ LAD dz >> cath results above - Dr Katrinka Blazing reviewed the films. due to tortuosity of the LAD and D3 branch, PCI will likely be complicated by an ischemic event due to either to coronary dissection and or sidebranch occlusion.  Tortuosity may make it difficult to even cross the stenosis.  PCI would likely cause more harm than good.  Recommend medical therapy. - she is on Isordil (do not use 20 mg tabs due to dye allergy), with dose decreased 2nd side effects - GI issues w/ ASA, on Plavix - on Ranexa 500 mg bid, no dose increase due to interaction w/ fluconazole - 3.2-second pause on monitor overnight, will d/c nebivolol -Started on nitroglycerin drip overnight due to chest pain.  She reports continuous chest pain since 1 AM last night.  EKG appears unchanged with less than 1 mm ST depressions in leads I/aVL and Q waves in leads III/aVF.  We will repeat EKG this  morning and trend troponins.  Can continue nitroglycerin drip for now but if troponins negative, given continuous chest pain, would not suspect pain is from ischemia.  If troponins positive, will need further discussion with interventional cardiology about PCI  ADDENDUM: EKG unchanged and troponin negative.  Given negative troponin despite continuous chest pain for hours, do not suspect chest pain is from ischemic heart disease.  Will wean off nitro gtt  2. HTN: - improved w/ Rx changes, meds greatly limited by yellow dye and other allergies (18 total) - hydralazine and isordil decreased  - discussed need to tolerate some side effects which may improve w/ time, since med selection is so limited and she feels better than she did when BP was not controlled - still c/o feeling swollen, reviewed the reasons I do not think she has extra volume on board - continue spiro, can use Lasix prn for weight gain if needed.  Weight has been stable.  Will check BNP   3. Mild AS: - echo this admit w/ peak gradient 20 -continue statin - do not get dehydrated   4. Aortic root dilatation: - 43 mm asc Ao - follow as outpt  5. 18 med allergies/intolerances - Some of these were acquired during prolonged ABX use in the 1980s - suggested she see an allergist to determine what she is actually allergic to now  6. Sinus pause: 3.2-second sinus pause on 6/26 AM.  She denies any lightheadedness or syncope.  We will hold nebivolol as above    Signed, Little Ishikawa, MD 09/25/2020 8:33 AM   For questions or updates, please contact   Please consult [email or web address] for contact info under Cardiology/STEMI.

## 2020-09-26 LAB — CBC
HCT: 30 % — ABNORMAL LOW (ref 36.0–46.0)
Hemoglobin: 9.8 g/dL — ABNORMAL LOW (ref 12.0–15.0)
MCH: 29.3 pg (ref 26.0–34.0)
MCHC: 32.7 g/dL (ref 30.0–36.0)
MCV: 89.6 fL (ref 80.0–100.0)
Platelets: 280 10*3/uL (ref 150–400)
RBC: 3.35 MIL/uL — ABNORMAL LOW (ref 3.87–5.11)
RDW: 13.3 % (ref 11.5–15.5)
WBC: 6.9 10*3/uL (ref 4.0–10.5)
nRBC: 0 % (ref 0.0–0.2)

## 2020-09-26 MED ORDER — NIFEDIPINE ER OSMOTIC RELEASE 30 MG PO TB24
30.0000 mg | ORAL_TABLET | Freq: Every day | ORAL | Status: DC
  Administered 2020-09-27: 30 mg via ORAL
  Filled 2020-09-26: qty 1

## 2020-09-26 NOTE — Progress Notes (Signed)
Physical Therapy Treatment and Discharge Patient Details Name: Jennifer [name] MRN: [id number] DOB: 1945/11/13 Today's Date: 09/26/2020    History of Present Illness Pt is a 75 y.o. female admitted 09/15/20 with c/o chest pain. Workup for hypertensive urgency. Workup revealed significant stenosis in LAD, not amenable to PCI. PMH includes HTN, HLD, insterstitial cystitis requiring intermittent self-cath, lumbar stenosis, chronic pain, fibromyalgia.    PT Comments    Pt met her physical therapy goals during her inpatient stay. Pt reports resolution of chest pain. Ambulating hallway distances and negotiated 29 steps (ascent + descent) with bilateral hand rails to simulate apartment set up. HR 83-90 bpm, SpO2 97% on RA. Extensive discussion with pt regarding activity recommendations and progression and energy conservation strategies. Pt with no further acute PT needs; recommend OPPT follow up.    Follow Up Recommendations  Outpatient PT     Equipment Recommendations  None recommended by PT    Recommendations for Other Services       Precautions / Restrictions Precautions Precautions: Fall Restrictions Weight Bearing Restrictions: No    Mobility  Bed Mobility               General bed mobility comments: Standing at sink upon arrival    Transfers Overall transfer level: Independent Equipment used: None                Ambulation/Gait Ambulation/Gait assistance: Independent Gait Distance (Feet): 200 Feet Assistive device: None Gait Pattern/deviations: Step-through pattern;Decreased stride length;Trunk flexed Gait velocity: Decreased       Stairs Stairs: Yes Stairs assistance: Modified independent (Device/Increase time) Stair Management: Step to pattern;Forwards;Two rails Number of Stairs: 29 General stair comments: x1 standing rest break, use of bilateral rails, no physical assist required   Wheelchair Mobility    Modified Rankin (Stroke Patients  Only)       Balance Overall balance assessment: No apparent balance deficits (not formally assessed)                                          Cognition Arousal/Alertness: Awake/alert Behavior During Therapy: WFL for tasks assessed/performed Overall Cognitive Status: Within Functional Limits for tasks assessed                                        Exercises      General Comments        Pertinent Vitals/Pain Pain Assessment: Faces Faces Pain Scale: No hurt    Home Living                      Prior Function            PT Goals (current goals can now be found in the care plan section) Acute Rehab PT Goals Patient Stated Goal: get up the stairs at my apartment PT Goal Formulation: With patient Time For Goal Achievement: 10/04/20 Potential to Achieve Goals: Good Progress towards PT goals: Goals met/education completed, patient discharged from PT    Frequency    Min 3X/week      PT Plan Current plan remains appropriate    Co-evaluation              AM-PAC PT "6 Clicks" Mobility   Outcome Measure  Help needed turning from your back  to your side while in a flat bed without using bedrails?: None Help needed moving from lying on your back to sitting on the side of a flat bed without using bedrails?: None Help needed moving to and from a bed to a chair (including a wheelchair)?: None Help needed standing up from a chair using your arms (e.g., wheelchair or bedside chair)?: None Help needed to walk in hospital room?: None Help needed climbing 3-5 steps with a railing? : None 6 Click Score: 24    End of Session   Activity Tolerance: Patient tolerated treatment well Patient left: in chair;with call bell/phone within reach Nurse Communication: Mobility status PT Visit Diagnosis: Other abnormalities of gait and mobility (R26.89);Pain     Time: 6063-0160 PT Time Calculation (min) (ACUTE ONLY): 50 min  Charges:   $Therapeutic Activity: 38-52 mins                     Wyona Almas, PT, DPT Acute Rehabilitation Services Pager [phone] Office [place] 09/26/2020, 5:13 PM

## 2020-09-26 NOTE — Progress Notes (Signed)
Jennifer [name]  WJX:[id number] DOB: 1945/05/07 DOA: 09/15/2020 PCP: [facility]    Brief Narrative:  75 year old with a history of resistant HTN, HLD, GERD, lumbar stenosis with chronic lumbago, and interstitial cystitis requiring intermittent self cath who was sent to the ED by her cardiologist with complaints of chest discomfort and uncontrolled HTN.  Significant Events:  6/16 admit on referral from her cardiologist office 6/17 left heart cath  Consultants:  Cardiology GI  Code Status: FULL CODE  Antimicrobials:  None  DVT prophylaxis: Lovenox  Subjective: Afebrile.  Vital signs stable.  The patient complains of a feeling of "generalized overall bloating" which she thinks is due to one of her medications "because I'm that person who has an unusual reaction to most medications."  She also states that she is having difficulty with stairs and is worried that she has to climb 37 stairs to get into her home.  She denies chest pain nausea or vomiting.  Assessment & Plan:  Hypertensive emergency - resistant HTN Systolic blood pressure as high as 260 with chest pain while at [facility] heart clinic -cardiology managing antihypertensives -blood pressure now well controlled  Chest pain -CAD CT FFR noted significant functional stenosis of LAD -lesion not amenable to PCI during LHC - plan is for optimization of medical therapy -cleared for discharge home by cardiology today -discontinue cardiac monitoring  Mild aortic stenosis To be followed as outpatient  "Bloated feeling" Only trace B LE edema noted on exam - preserved EF on TTE w/ only grade 1 DD - net negative ~3.2L since admit - wgt stable since admit at ~90.8kg - pt feels it is "due to one of the new medications" -I do not note a medication on her active MAR known for causing this sensation  GERD with possible esophageal spasms Care as per GI  Lumbar stenosis with chronic lumbago Continue usual home Norco  dose  Chronic interstitial cystitis Continue usual outpatient management  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    Family Communication:  Status is: Inpatient  Remains inpatient appropriate because:Inpatient level of care appropriate due to severity of illness  Dispo: The patient is from: Home              Anticipated d/c is to: Home              Patient currently is not medically stable to d/c.   Difficult to place patient No   Objective: Blood pressure (!) 146/61, pulse 69, temperature 98.5 F (36.9 C), temperature source Oral, resp. rate 18, height 5\' 5"  (1.651 m), weight 90.8 kg, SpO2 95 %.  Intake/Output Summary (Last 24 hours) at 09/26/2020 1020 Last data filed at 09/26/2020 1006 Gross per 24 hour  Intake 60.83 ml  Output 875 ml  Net -814.17 ml   Filed Weights   09/24/20 0500 09/25/20 0500 09/26/20 0415  Weight: 91 kg 90.9 kg 90.8 kg    Examination: General: No acute respiratory distress Lungs: Clear to auscultation bilaterally without wheezes or crackles Cardiovascular: Regular rate and rhythm without murmur gallop or rub normal S1 and S2 Abdomen: Nontender, nondistended, soft, bowel sounds positive, no rebound, no ascites, no appreciable mass Extremities: Trace bilateral lower extremity edema without cyanosis or clubbing  CBC: Recent Labs  Lab 09/25/20 0924 09/26/20 0319  WBC 5.2 6.9  HGB 10.1*  9.8*  HCT 31.9* 30.0*  MCV 90.1 89.6  PLT 303 280   Basic Metabolic Panel: Recent Labs  Lab 09/21/20 0737 09/23/20 0413 09/25/20 0924  NA 139 136 136  K 3.9 3.5 4.0  CL 106 104 103  CO2 25 26 26   GLUCOSE 113* 117* 110*  BUN 14 15 16   CREATININE 0.78 0.78 0.84  CALCIUM 8.9 8.5* 8.6*   GFR: Estimated Creatinine Clearance: 66.4 mL/min (by C-G formula based on SCr of 0.84  mg/dL).  Liver Function Tests: Recent Labs  Lab 09/25/20 0924  AST 23  ALT 22  ALKPHOS 51  BILITOT 0.6  PROT 6.1*  ALBUMIN 3.5     Recent Results (from the past 240 hour(s))  Culture, Urine     Status: None   Collection Time: 09/16/20  1:27 PM   Specimen: Urine, Clean Catch  Result Value Ref Range Status   Specimen Description URINE, CLEAN CATCH  Final   Special Requests NONE  Final   Culture   Final    NO GROWTH Performed at [facility], [address] [place], [address] [place]    Report Status 09/17/2020 FINAL  Final     Scheduled Meds:  alum & mag hydroxide-simeth  30 mL Oral Once   atorvastatin  40 mg Oral Daily   clopidogrel  75 mg Oral Daily   doxazosin  1 mg Oral Daily   enoxaparin (LOVENOX) injection  40 mg Subcutaneous Q24H   fluconazole  100 mg Oral Daily   fluticasone  1 spray Each Nare Daily   hydrALAZINE  75 mg Oral Q8H   isosorbide dinitrate  10 mg Oral TID   losartan  100 mg Oral Daily   NIFEdipine  10 mg Oral Q8H   omeprazole  40 mg Oral BID   ranolazine  500 mg Oral BID   sodium chloride flush  3 mL Intravenous Q12H   spironolactone  25 mg Oral Daily   sucralfate  1 g Oral BID   Continuous Infusions:  sodium chloride     sodium chloride 1 mL/kg/hr (09/16/20 1100)   nitroGLYCERIN Stopped (09/25/20 1634)   promethazine (PHENERGAN) injection (IM or IVPB) 6.25 mg (09/17/20 2323)     LOS: 10 days   09/27/20, MD [facility] Office  [phone] Pager - Text Page per Lonia Blood  If 7PM-7AM, please contact night-coverage per Amion 09/26/2020, 10:20 AM

## 2020-09-26 NOTE — Progress Notes (Signed)
Progress Note  Patient Name: Jennifer [name] Date of En[facility]: 09[facility]26/2020 [name] Cardiologist: Christell ConstantMahesh A Chandrasekhar, MD   Subjective   Pt without chest pain today !  She is feeling better and is glad.  Still with DOE especially with stairs.  She hopes to stay another day in hospital -this is her first day without pain, to become more active  Inpatient Medications    Scheduled Meds:  alum & mag hydroxide-simeth  30 mL Oral Once   atorvastatin  40 mg Oral Daily   clopidogrel  75 mg Oral Daily   doxazosin  1 mg Oral Daily   enoxaparin (LOVENOX) injection  40 mg Subcutaneous Q24H   fluconazole  100 mg Oral Daily   fluticasone  1 spray Each Nare Daily   hydrALAZINE  75 mg Oral Q8H   isosorbide dinitrate  10 mg Oral TID   losartan  100 mg Oral Daily   NIFEdipine  10 mg Oral Q8H   omeprazole  40 mg Oral BID   ranolazine  500 mg Oral BID   sodium chloride flush  3 mL Intravenous Q12H   spironolactone  25 mg Oral Daily   sucralfate  1 g Oral BID   Continuous Infusions:  sodium chloride     sodium chloride 1 mL/kg/hr (09/16/20 1100)   nitroGLYCERIN Stopped (09/25/20 1634)   promethazine (PHENERGAN) injection (IM or IVPB) 6.25 mg (09/17/20 2323)   PRN Meds: sodium chloride, acetaminophen, alum & mag hydroxide-simeth **AND** [COMPLETED] lidocaine, hydrALAZINE, HYDROcodone-acetaminophen, magnesium hydroxide, nitroGLYCERIN, promethazine (PHENERGAN) injection (IM or IVPB), sodium chloride flush   Vital Signs    Vitals:   09/25/20 1437 09/25/20 1444 09/26/20 0415 09/26/20 0619  BP: (!) 117/51 124/63 (!) 131/59 131/63  Pulse:   69   Resp:   18   Temp:   98.5 F (36.9 C)   TempSrc:   Oral   SpO2:      Weight:   90.8 kg   Height:        Intake/Output Summary (Last 24 hours) at 09/26/2020 0747 Last data filed at 09/25/2020 1133 Gross per 24 hour  Intake 737.83 ml  Output 875 ml  Net -137.17 ml   Last 3 Weights 09/26/2020 09/25/2020 09/24/2020  Weight (lbs) 200 lb  1.6 oz 200 lb 4.8 oz 200 lb 9.9 oz  Weight (kg) 90.765 kg 90.855 kg 91 kg      Telemetry    SR to SB no pauses since yesterday and stopping BB, occ PVCs no further pauses (Mobitz I)- Personally Reviewed  she feels her heart beating strange at times but none on monitor  ECG    No new - Personally Reviewed  Physical Exam   GEN: No acute distress.   Neck: No JVD Cardiac: RRR, 2-3/6 systolic murmurs, no rubs, or gallops.  Respiratory: Clear to auscultation bilaterally. GI: Soft, nontender, non-distended  MS: No edema; No deformity. Neuro:  Nonfocal  Psych: Normal affect   Labs    High Sensitivity Troponin:   Recent Labs  Lab 09/15/20 1418 09/16/20 0238 09/23/20 0455 09/23/20 0629 09/25/20 0924  TROPONINIHS 13 12 9 8 10       Chemistry Recent Labs  Lab 09/21/20 0737 09/23/20 0413 09/25/20 0924  NA 139 136 136  K 3.9 3.5 4.0  CL 106 104 103  CO2 25 26 26   GLUCOSE 113* 117* 110*  BUN 14 15 16   CREATININE 0.78 0.78 0.84  CALCIUM 8.9 8.5* 8.6*  PROT  --   --  6.1*  ALBUMIN  --   --  3.5  AST  --   --  23  ALT  --   --  22  ALKPHOS  --   --  51  BILITOT  --   --  0.6  GFRNONAA >60 >60 >60  ANIONGAP 8 6 7      Hematology Recent Labs  Lab 09/25/20 0924 09/26/20 0319  WBC 5.2 6.9  RBC 3.54* 3.35*  HGB 10.1* 9.8*  HCT 31.9* 30.0*  MCV 90.1 89.6  MCH 28.5 29.3  MCHC 31.7 32.7  RDW 13.4 13.3  PLT 303 280    BNP Recent Labs  Lab 09/25/20 0924  BNP 106.8*     DDimer No results for input(s): DDIMER in the last 168 hours.   Radiology    No results found.  Cardiac Studies    LHC 09/16/20:   Mid LAD lesion is 50% stenosed. 3rd Diag lesion is 60% stenosed. Dist LAD-1 lesion is 90% stenosed. Dist LAD-2 lesion is 40% stenosed.   1.  Significant one-vessel coronary artery disease involving the mid to distal LAD at the bifurcation of the third diagonal which has also 60% ostial stenosis.  The coronary arteries are very tortuous suggestive of  hypertensive heart disease. 2.  Left ventricular angiography was not performed.  EF was normal by echo. 3.  Severely elevated blood pressure with mildly elevated left ventricular end-diastolic pressure. 4.  Transient left bundle branch block after engaging the left ventricle.  This resolved by the end of the case. 5.  Abdominal aortogram was performed and showed no evidence of renal artery stenosis.   Recommendations: Unfortunately, the LAD stenosis is in a very tortuous segment and at the origin of the third diagonal branch which also has ostial disease.  PCI in this area will be high risk for dissection as well as sidebranch closure especially in the setting of uncontrolled hypertension.  In addition, the distal LAD after the stenosis is diffusely diseased. I recommend maximizing medical therapy and controlling blood pressure before considering PCI.  I added amlodipine to her antihypertensive medications. Other complicating issue is that the patient cannot tolerate aspirin long-term.  If we decide to proceed with PCI at some point, we will have to make sure she tolerates ticagrelor which can likely be used as monotherapy.      Diagnostic Dominance: Right        Echocardiogram 09/15/20:   1. Left ventricular ejection fraction, by estimation, is 60 to 65%. The  left ventricle has normal function. The left ventricle has no regional  wall motion abnormalities. There is moderate left ventricular hypertrophy.  Left ventricular diastolic  parameters are consistent with Grade I diastolic dysfunction (impaired  relaxation). Elevated left atrial pressure. The average left ventricular  global longitudinal strain is -20.4 %. The global longitudinal strain is  normal.   2. Right ventricular systolic function is normal. The right ventricular  size is normal.   3. The mitral valve is normal in structure. Trivial mitral valve  regurgitation. No evidence of mitral stenosis.   4. The aortic valve is  tricuspid. Aortic valve regurgitation is not  visualized. Mild aortic valve stenosis.   5. Aortic dilatation noted. There is mild dilatation of the ascending  aorta, measuring 43 mm.   6. The inferior vena cava is normal in size with greater than 50%  respiratory variability, suggesting right atrial pressure of 3 mmHg.   Comparison(s): 07/11/17 EF 60-65%. PA pressure 09/10/17.  Cardiac  CTA with FFR 09/16/20  FINDINGS: CT-FFR analysis was performed on the original cardiac CT angiogram dataset. Diagrammatic representation of the CT-FFR analysis is provided in a separate PDF document in PACS. This dictation was created using the PDF document and an interactive 3D model of the results. 3D model is not available in the EMR/PACS. Normal FFR range is >0.80.   1. Left Main: No significant functional stenosis, CT-FFR 0.99.   2. LAD: Significant functional stenosis in mid-distal LAD, CT-FFR 0.67.   3. LCX: No significant functional stenosis, CT-FFR 0.92.   4. RCA: No significant functional stenosis, CT-FFR 0.96.   5. Ramus: No significant functional stenosis, CT-FFR 0.89.   : 1. CT FFR analysis shows evidence of significant functional stenosis in the LAD.  IMPRESSION: 1. Coronary calcium score of 157. This was 74th percentile for age, sex, and race matched control.   2. Normal coronary origin with right dominance.   3. CAD-RADS 3. Moderate stenosis. Consider symptom-guided anti-ischemic pharmacotherapy as well as risk factor modification per guideline directed care. Additional analysis with CT FFR will be submitted.   4. Ascending aorta is mildly dilation for age and body surface area: 40 mm.   5. Mild dilation of the pulmonary artery.   6. Cannot exclude small patent foramen ovale.   Reaching out to primary team.   RECOMMENDATIONS:   Coronary artery calcium (CAC) score is a strong predictor of incident coronary heart disease (CHD) and provides predictive information beyond  traditional risk factors. CAC scoring is reasonable to use in the decision to withhold, postpone, or initiate statin therapy in intermediate-risk or selected borderline-risk asymptomatic adults (age 89-75 years and LDL-C >=70 to <190 mg/dL) who do not have diabetes or established atherosclerotic cardiovascular disease (ASCVD).* In intermediate-risk (10-year ASCVD risk >=7.5% to <20%) adults or selected borderline-risk (10-year ASCVD risk >=5% to <7.5%) adults in whom a CAC score is measured for the purpose of making a treatment decision the following recommendations have been made: Patient Profile     75 y.o. female with a hx of hypertension, PVC, HLD, chronic pain syndrome with lumbar stenosis was admitted after she went to the office w/ CP and SBP 260 >> sent to ER >> cardiac CT w/ LAD stenosis >> admitted for cath  Assessment & Plan    Chest pain, /CAD see cath results Dr Katrinka Blazing reviewed the films. due to tortuosity of the LAD and D3 branch, PCI will likely be complicated by an ischemic event due to either to coronary dissection and or sidebranch occlusion.  Tortuosity may make it difficult to even cross the stenosis.  PCI would likely cause more harm than good.  Recommend medical therapy. - she is on Isordil (do not use 20 mg tabs due to dye allergy), with dose decreased 2nd side effects -issues with ASA so on Plavix -on Ranexa no increase in dose due to interaction w/ fluconazole -has been on NTG for chest pain but no EKG changes yesterday and troponin neg. 9,8,10 NTG weaned and possible other cause for chest pain. -GI is following and on omeprazole 40 BID and sucralfate BID  Sinus pause up to 3.2 sec Nebivolol stopped.  Appeared to be Mobitz 1  HTN -presented with hypertensive emergency SBP 260 on admit -multiple allergies.   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  -hx interstitial cystitis requiring intermittent self caths       For questions or updates, please contact [facility] Please consult [email or web address] for contact info under        Signed, Nada Boozer, NP  09/26/2020, 7:47 AM

## 2020-09-27 MED ORDER — ATORVASTATIN CALCIUM 40 MG PO TABS: 40.0000 mg | ORAL_TABLET | Freq: Every day | ORAL | 0 refills | Status: DC

## 2020-09-27 MED ORDER — CLOPIDOGREL BISULFATE 75 MG PO TABS: 75.0000 mg | ORAL_TABLET | Freq: Every day | ORAL | 0 refills | Status: DC

## 2020-09-27 MED ORDER — NIFEDIPINE ER 30 MG PO TB24: 30.0000 mg | ORAL_TABLET | Freq: Every day | ORAL | 0 refills | Status: DC

## 2020-09-27 MED ORDER — HYDRALAZINE HCL 25 MG PO TABS: 75.0000 mg | ORAL_TABLET | Freq: Three times a day (TID) | ORAL | 0 refills | Status: DC

## 2020-09-27 MED ORDER — RANOLAZINE ER 500 MG PO TB12: 500.0000 mg | ORAL_TABLET | Freq: Two times a day (BID) | ORAL | 0 refills | Status: DC

## 2020-09-27 MED ORDER — NITROGLYCERIN 0.4 MG SL SUBL: 0.4000 mg | SUBLINGUAL_TABLET | SUBLINGUAL | 1 refills | Status: DC | PRN

## 2020-09-27 MED ORDER — DOXAZOSIN MESYLATE 1 MG PO TABS: 1.0000 mg | ORAL_TABLET | Freq: Every day | ORAL | 0 refills | Status: DC

## 2020-09-27 MED ORDER — SPIRONOLACTONE 25 MG PO TABS: 25.0000 mg | ORAL_TABLET | Freq: Every day | ORAL | 0 refills | Status: DC

## 2020-09-27 MED ORDER — ISOSORBIDE DINITRATE 10 MG PO TABS: 10.0000 mg | ORAL_TABLET | Freq: Three times a day (TID) | ORAL | 0 refills | Status: DC

## 2020-09-27 NOTE — Discharge Summary (Signed)
Physician Discharge Summary  Jennifer [name] ZOX:[id number] DOB: 31-Jul-1945 DOA: 09/15/2020  PCP: [facility]  Admit date: 09/15/2020 Discharge date: 09/27/2020  Admitted From: Home Disposition: Home  Recommendations for Outpatient Follow-up:  Follow up with PCP in the next few days Please obtain BMP/CBC at next office visit Please follow up with cardiology and GI as discussed  Home Health: None Equipment/Devices: None  Discharge Condition: Stable CODE STATUS: Full Diet recommendation: Low-salt low-fat diet  Brief/Interim Summary: Patient is a 75 year old female with a history of resistant HTN, HLD, GERD, lumbar stenosis with chronic lumbago, and interstitial cystitis requiring intermittent self cath who was sent to the ED by her cardiologist with complaints of chest discomfort and uncontrolled HTN.  Patient admitted as above with complaints of chest pain, status post left heart cath 09/16/20 which was without intervention due to tortuosity of the LAD, D3 branch -per cardiology.  Recommending medical therapy at this time.  Patient had multiple medication changes during hospitalization including discontinuation of beta-blockers due to sinus pause concerning for Mobitz type I.  Transitioned to nifedipine, as well as Cardura, hydralazine, isosorbide, losartan, ranolazine, spironolactone.  Patient continues to have plethora of complaints today, nonspecific bloating type complaints without any true locality as she states it is "everywhere".  She continues to be focused on drug interactions given her numerous medication intolerances she is concerned that she is having a reaction to some new medication which we discussed was less likely given her vital signs and labs have been quite stable over the past few days with no real medication changes recently.  We do recommend close follow-up with PCP and cardiology later this week or early next week pending their schedule to further  evaluate her vitals, medication tolerances as well as repeat labs to rule out any abnormalities.  She is also requesting discharge follow-up with GI which we discussed was certainly reasonable given her history and she can have further outpatient work-up with their specialty as well.  Patient is quite anxious at bedside about discharge, we discussed she would likely benefit from evaluation with PCP for anxiolytics, given she follows with the pain clinic and her age would recommend to avoid benzodiazepines but otherwise would recommend close follow-up in the next week for further discussion on anxiolytics.   Discharge Diagnoses:  Principal Problem:   Hypertensive urgency Active Problems:   GERD (gastroesophageal reflux disease)   Chest pain   Interstitial cystitis (chronic) without hematuria   Chronic low back pain   Chronic diastolic CHF (congestive heart failure) (HCC)   Unstable angina [facility])   Hypertensive crisis    Discharge Instructions  Discharge Instructions     Ambulatory referral to Physical Therapy   Complete by: As directed    Evaluation and treatment-recommendations from PT for (BACK).   Call MD for:  difficulty breathing, headache or visual disturbances   Complete by: As directed    Call MD for:  extreme fatigue   Complete by: As directed    Call MD for:  extreme fatigue   Complete by: As directed    Call MD for:  persistant dizziness or light-headedness   Complete by: As directed    Call MD for:  persistant dizziness or light-headedness   Complete by: As directed    Call MD for:  severe uncontrolled pain   Complete by: As directed    Diet - low sodium heart healthy   Complete by: As directed    Increase activity slowly  Complete by: As directed    Increase activity slowly   Complete by: As directed       Allergies as of 09/27/2020       Reactions   Moxifloxacin Hives, Shortness Of Breath, Swelling, Rash   Rash was severe, caused tremors, ans skin became  BLOODY RED   Quinolones Anaphylaxis   Sulfa Antibiotics Hives, Rash   Furosemide Other (See Comments)   CANNOT TAKE DUE TO Interstitial cystitis   Aspirin Nausea Only, Other (See Comments)   GI and stomach upset   Duloxetine Hcl Nausea Only, Other (See Comments)   Vertigo and heart racing also   Hydrochlorothiazide Other (See Comments)   Headaches   Ibuprofen Nausea Only, Other (See Comments)   GI upset   Other Other (See Comments)   Robaxin [methocarbamol] Other (See Comments)   "Make me feel flu-like symptoms"   Topiramate Itching   Zofran [ondansetron Hcl] Other (See Comments)   Headaches   Codeine Hives, Nausea And Vomiting, Rash   Dimethyl Sulfoxide Hives, Itching   Macrodantin [nitrofurantoin Macrocrystal] Itching, Nausea And Vomiting, Rash   Abdominal and chest pain, also- blood-red skin, also   Nitrofurantoin Diarrhea, Itching, Rash, Other (See Comments)   GI/stomach upset, sweating, chest pain, and skin turned bloody red   Tape Rash   Yellow Dye Rash        Medication List     STOP taking these medications    amoxicillin-clavulanate 500-125 MG tablet Commonly known as: AUGMENTIN   dextromethorphan-guaiFENesin 30-600 MG 12hr tablet Commonly known as: MUCINEX DM   Moderna COVID-19 Vaccine 100 MCG/0.5ML injection Generic drug: COVID-19 mRNA vaccine (Moderna)   nebivolol 5 MG tablet Commonly known as: BYSTOLIC       TAKE these medications    acetaminophen 500 MG tablet Commonly known as: TYLENOL Take 500 mg by mouth 2 (two) times daily as needed (for pain).   atorvastatin 40 MG tablet Commonly known as: LIPITOR Take 1 tablet (40 mg total) by mouth daily.   clopidogrel 75 MG tablet Commonly known as: PLAVIX Take 1 tablet (75 mg total) by mouth daily.   D-Mannose 500 MG Caps Take 500-1,500 mg by mouth 3 (three) times daily.   doxazosin 1 MG tablet Commonly known as: CARDURA Take 1 tablet (1 mg total) by mouth daily.   fexofenadine 60 MG  tablet Commonly known as: ALLEGRA Take 60 mg by mouth See admin instructions. Take 60 mg by mouth one to two times a day as needed for allergy symptoms   fluconazole 100 MG tablet Commonly known as: DIFLUCAN Take 1 tablet (100 mg total) by mouth daily. What changed:  when to take this reasons to take this   fosfomycin 3 g Pack Commonly known as: MONUROL Take 3 g by mouth once.   hydrALAZINE 25 MG tablet Commonly known as: APRESOLINE Take 3 tablets (75 mg total) by mouth every 8 (eight) hours.   HYDROcodone-acetaminophen 7.5-325 MG tablet Commonly known as: NORCO Take 1 tablet by mouth See admin instructions. Take 1 tablet by mouth three to four times a day   ipratropium 0.06 % nasal spray Commonly known as: ATROVENT Place 2 sprays into both nostrils 2 (two) times daily as needed for rhinitis.   isosorbide dinitrate 10 MG tablet Commonly known as: ISORDIL Take 1 tablet (10 mg total) by mouth 3 (three) times daily.   losartan 100 MG tablet Commonly known as: COZAAR Take 100 mg by mouth daily.   meclizine 25 MG tablet  Commonly known as: ANTIVERT Take 25 mg by mouth 3 (three) times daily as needed for dizziness.   Meth-Hyo-M Bl-Benz Acd-Ph Sal 81.6 MG Tabs Take 1 tablet (81.6 mg total) by mouth 2 (two) times daily as needed. What changed: reasons to take this   methenamine 1 g tablet Commonly known as: HIPREX Take by mouth as directed. TAKES WHEN NOT TAKING ANTIBIOTICS   NIFEdipine 30 MG 24 hr tablet Commonly known as: ADALAT CC Take 1 tablet (30 mg total) by mouth daily.   nitroGLYCERIN 0.4 MG SL tablet Commonly known as: NITROSTAT Place 1 tablet (0.4 mg total) under the tongue every 5 (five) minutes as needed for chest pain.   nystatin cream Commonly known as: MYCOSTATIN Apply topically 2 (two) times daily.   omeprazole 20 MG capsule Commonly known as: PRILOSEC Take 20 mg by mouth See admin instructions. Take 20 mg by mouth in the morning one hour before  breakfast and an additional 20 mg before evening meal as needed for GERD   ondansetron 4 MG disintegrating tablet Commonly known as: ZOFRAN-ODT Take 4 mg by mouth every 8 (eight) hours as needed for nausea (dissolve orally).   ranolazine 500 MG 12 hr tablet Commonly known as: RANEXA Take 1 tablet (500 mg total) by mouth 2 (two) times daily.   spironolactone 25 MG tablet Commonly known as: ALDACTONE Take 1 tablet (25 mg total) by mouth daily.   sucralfate 1 g tablet Commonly known as: CARAFATE Take 1 g by mouth See admin instructions. Take 1 gram by mouth in the morning and an additional 1 gram once a day for flares   triamcinolone cream 0.1 % Commonly known as: KENALOG Apply 1 application topically daily as needed (for eczema).        Follow-up Information     [facility] Follow up.   Specialty: Rehabilitation Why: Please call the office to schedule an appointment for outpatient physical therapy. Contact information: [address] [id number] [place] [address] [phone]        Christell Constant, MD Follow up on 10/24/2020.   Specialty: Cardiology Why: at 8:20 AM Contact information: [address] [place] [address] [phone]                Allergies  Allergen Reactions   Moxifloxacin Hives, Shortness Of Breath, Swelling and Rash    Rash was severe, caused tremors, ans skin became BLOODY RED   Quinolones Anaphylaxis   Sulfa Antibiotics Hives and Rash   Furosemide Other (See Comments)    CANNOT TAKE DUE TO Interstitial cystitis   Aspirin Nausea Only and Other (See Comments)    GI and stomach upset   Duloxetine Hcl Nausea Only and Other (See Comments)    Vertigo and heart racing also    Hydrochlorothiazide Other (See Comments)    Headaches    Ibuprofen Nausea Only and Other (See Comments)    GI upset   Other Other (See Comments)   Robaxin [Methocarbamol] Other  (See Comments)    "Make me feel flu-like symptoms"   Topiramate Itching   Zofran [Ondansetron Hcl] Other (See Comments)    Headaches    Codeine Hives, Nausea And Vomiting and Rash   Dimethyl Sulfoxide Hives and Itching   Macrodantin [Nitrofurantoin Macrocrystal] Itching, Nausea And Vomiting and Rash    Abdominal and chest pain, also- blood-red skin, also   Nitrofurantoin Diarrhea, Itching, Rash and Other (See Comments)  GI/stomach upset, sweating, chest pain, and skin turned bloody red   Tape Rash   Yellow Dye Rash    Consultations: Cardiology  Procedures/Studies: CT Head Wo Contrast  Result Date: 09/15/2020 CLINICAL DATA:  Headache EXAM: CT HEAD WITHOUT CONTRAST TECHNIQUE: Contiguous axial images were obtained from the base of the skull through the vertex without intravenous contrast. COMPARISON:  None. FINDINGS: Brain: Normal anatomic configuration. Parenchymal volume loss is commensurate with the patient's age. Mild periventricular white matter changes are present likely reflecting the sequela of small vessel ischemia. No abnormal intra or extra-axial mass lesion or fluid collection. No abnormal mass effect or midline shift. No evidence of acute intracranial hemorrhage or infarct. Ventricular size is normal. Cerebellum unremarkable. Vascular: No asymmetric hyperdense vasculature at the skull base. Skull: Intact Sinuses/Orbits: Mucous retention cyst noted within the right maxillary sinus. Remaining paranasal sinuses are clear. Orbits are unremarkable. Other: Mastoid air cells and middle ear cavities are clear. IMPRESSION: No acute intracranial abnormality.  Mild senescent change. Electronically Signed   By: Helyn Numbers MD   On: 09/15/2020 15:53   CT Head Wo Contrast  Result Date: 09/08/2020 CLINICAL DATA:  Subarachnoid hemorrhage suspected. High blood pressure. EXAM: CT HEAD WITHOUT CONTRAST TECHNIQUE: Contiguous axial images were obtained from the base of the skull through the vertex  without intravenous contrast. COMPARISON:  None. FINDINGS: Brain: No acute intracranial hemorrhage. No focal mass lesion. No CT evidence of acute infarction. No midline shift or mass effect. No hydrocephalus. Basilar cisterns are patent. There are mild periventricular and subcortical white matter hypodensities. Generalized cortical atrophy. Vascular: No hyperdense vessel or unexpected calcification. Skull: Normal. Negative for fracture or focal lesion. Sinuses/Orbits: Paranasal sinuses and mastoid air cells are clear. Orbits are clear. Other: None. IMPRESSION: No intracranial hemorrhage Mild atrophy and white matter microvascular disease. Electronically Signed   By: Genevive Bi M.D.   On: 09/08/2020 20:26   CARDIAC CATHETERIZATION  Result Date: 09/16/2020  Mid LAD lesion is 50% stenosed.  3rd Diag lesion is 60% stenosed.  Dist LAD-1 lesion is 90% stenosed.  Dist LAD-2 lesion is 40% stenosed.  1.  Significant one-vessel coronary artery disease involving the mid to distal LAD at the bifurcation of the third diagonal which has also 60% ostial stenosis.  The coronary arteries are very tortuous suggestive of hypertensive heart disease. 2.  Left ventricular angiography was not performed.  EF was normal by echo. 3.  Severely elevated blood pressure with mildly elevated left ventricular end-diastolic pressure. 4.  Transient left bundle branch block after engaging the left ventricle.  This resolved by the end of the case. 5.  Abdominal aortogram was performed and showed no evidence of renal artery stenosis. Recommendations: Unfortunately, the LAD stenosis is in a very tortuous segment and at the origin of the third diagonal branch which also has ostial disease.  PCI in this area will be high risk for dissection as well as sidebranch closure especially in the setting of uncontrolled hypertension.  In addition, the distal LAD after the stenosis is diffusely diseased. I recommend maximizing medical therapy and  controlling blood pressure before considering PCI.  I added amlodipine to her antihypertensive medications. Other complicating issue is that the patient cannot tolerate aspirin long-term.  If we decide to proceed with PCI at some point, we will have to make sure she tolerates ticagrelor which can likely be used as monotherapy.   PERIPHERAL VASCULAR CATHETERIZATION  Result Date: 09/16/2020  Mid LAD lesion is 50% stenosed.  3rd  Diag lesion is 60% stenosed.  Dist LAD-1 lesion is 90% stenosed.  Dist LAD-2 lesion is 40% stenosed.  1.  Significant one-vessel coronary artery disease involving the mid to distal LAD at the bifurcation of the third diagonal which has also 60% ostial stenosis.  The coronary arteries are very tortuous suggestive of hypertensive heart disease. 2.  Left ventricular angiography was not performed.  EF was normal by echo. 3.  Severely elevated blood pressure with mildly elevated left ventricular end-diastolic pressure. 4.  Transient left bundle branch block after engaging the left ventricle.  This resolved by the end of the case. 5.  Abdominal aortogram was performed and showed no evidence of renal artery stenosis. Recommendations: Unfortunately, the LAD stenosis is in a very tortuous segment and at the origin of the third diagonal branch which also has ostial disease.  PCI in this area will be high risk for dissection as well as sidebranch closure especially in the setting of uncontrolled hypertension.  In addition, the distal LAD after the stenosis is diffusely diseased. I recommend maximizing medical therapy and controlling blood pressure before considering PCI.  I added amlodipine to her antihypertensive medications. Other complicating issue is that the patient cannot tolerate aspirin long-term.  If we decide to proceed with PCI at some point, we will have to make sure she tolerates ticagrelor which can likely be used as monotherapy.   CT CORONARY MORPH W/CTA COR W/SCORE W/CA W/CM &/OR  WO/CM  Addendum Date: 09/15/2020   ADDENDUM REPORT: 09/15/2020 18:26 CLINICAL DATA:  76 Year-old Caucasian Female EXAM: Cardiac/Coronary  CTA TECHNIQUE: The patient was scanned on a [employer]. FINDINGS: A 100 kV prospective scan was triggered in the descending thoracic aorta at 111 HU's. Axial non-contrast 3 mm slices were carried out through the heart. The data set was analyzed on a dedicated work station and scored using the Agatson method. Gantry rotation speed was 250 msecs and collimation was .6 mm. No beta blockade and 0.8 mg of sl NTG was given. The 3D data set was reconstructed in 5% intervals of the 67-82 % of the R-R cycle. Diastolic phases were analyzed on a dedicated work station using MPR, MIP and VRT modes. The patient received 100 cc of contrast. Aorta: Ascending aorta is mildly dilated for age and body surface area: 40 mm. No calcifications in cardiac field of view. No dissection. Main Pulmonary Artery: Mild dilation of the pulmonary artery: 32 mm with artery to aorta ratio of < 0.9. Aortic Valve:  Tri-leaflet.  Mild annular calcification. Coronary Arteries:  Normal coronary origin.  Right dominance. Coronary Calcium Score: Left main: 0 Left anterior descending artery: 156 Left circumflex artery: 0 Right coronary artery: 1 Total: 157 Percentile: 74th for age, sex, and race matched control. RCA is a large dominant artery that gives rise to PDA and PLA. There is no plaque. Left main is a large artery that gives rise to LAD and LCX arteries. There is no plaque. LAD is a large vessel that gives rise to one small D1 Branch. There is a mild non obstructive stenosis (25-49%) calcific plaque in the proximal LAD. There is a moderate stenosis (50-70%) calcific plaque in the mid LAD. There is a moderate stenosis (50-70%) calcific plaque in the distal LAD. LCX is a non-dominant artery.  There is no plaque. There is a ramus intermedius vessel with minimal non-obstructive (1-24%) soft plaque in the  mid vessel. Other findings: Normal pulmonary vein drainage into the left atrium. Normal left  atrial appendage without a thrombus. Cannot exclude small patent foramen ovale. Extra-cardiac findings: See attached radiology report for non-cardiac structures. IMPRESSION: 1. Coronary calcium score of 157. This was 74th percentile for age, sex, and race matched control. 2. Normal coronary origin with right dominance. 3. CAD-RADS 3. Moderate stenosis. Consider symptom-guided anti-ischemic pharmacotherapy as well as risk factor modification per guideline directed care. Additional analysis with CT FFR will be submitted. 4. Ascending aorta is mildly dilation for age and body surface area: 40 mm. 5. Mild dilation of the pulmonary artery. 6. Cannot exclude small patent foramen ovale. Reaching out to primary team. RECOMMENDATIONS: Coronary artery calcium (CAC) score is a strong predictor of incident coronary heart disease (CHD) and provides predictive information beyond traditional risk factors. CAC scoring is reasonable to use in the decision to withhold, postpone, or initiate statin therapy in intermediate-risk or selected borderline-risk asymptomatic adults (age 61-75 years and LDL-C >=70 to <190 mg/dL) who do not have diabetes or established atherosclerotic cardiovascular disease (ASCVD).* In intermediate-risk (10-year ASCVD risk >=7.5% to <20%) adults or selected borderline-risk (10-year ASCVD risk >=5% to <7.5%) adults in whom a CAC score is measured for the purpose of making a treatment decision the following recommendations have been made: If CAC = 0, it is reasonable to withhold statin therapy and reassess in 5 to 10 years, as long as higher risk conditions are absent (diabetes mellitus, family history of premature CHD in first degree relatives (males <55 years; females <65 years), cigarette smoking, LDL >=190 mg/dL or other independent risk factors). If CAC is 1 to 99, it is reasonable to initiate statin therapy for  patients ?73 years of age. If CAC is >=100 or >=75th percentile, it is reasonable to initiate statin therapy at any age. Cardiology referral should be considered for patients with CAC scores =400 or >=75th percentile. *2018 AHA/ACC/AACVPR/AAPA/ABC/ACPM/ADA/AGS/APhA/ASPC/NLA/PCNA Guideline on the Management of Blood Cholesterol: A Report of the American College of Cardiology/American Heart Association Task Force on Clinical Practice Guidelines. J Am Coll Cardiol. 2019;73(24):3168-3209. Riley Lam, MD Electronically Signed   By: Riley Lam MD   On: 09/15/2020 18:26   Result Date: 09/15/2020 EXAM: OVER-READ INTERPRETATION  CT CHEST The following report is an over-read performed by radiologist Dr. Jeronimo Greaves of [facility] on 09/15/2020. This over-read does not include interpretation of cardiac or coronary anatomy or pathology. The coronary CTA interpretation by the cardiologist is attached. COMPARISON:  Chest radiograph of earlier today. FINDINGS: Vascular: Upper normal ascending aortic caliber, including 3.9 cm on 09/10. Aortic atherosclerosis. No central pulmonary embolism, on this non-dedicated study. Mediastinum/Nodes: No imaged thoracic adenopathy. Tiny hiatal hernia. Lungs/Pleura: No pleural fluid.  Clear imaged lungs. Upper Abdomen: Moderate hepatic steatosis. Normal imaged portions of the spleen. Musculoskeletal: Mild-to-moderate convex right lower thoracic spinal curvature. IMPRESSION: 1. No acute findings in the imaged extracardiac chest. 2. Hepatic steatosis. 3. Tiny hiatal hernia. 4. Aortic Atherosclerosis (ICD10-I70.0). Electronically Signed: By: Jeronimo Greaves M.D. On: 09/15/2020 15:51   DG Chest Port 1 View  Result Date: 09/15/2020 CLINICAL DATA:  Chest pain EXAM: PORTABLE CHEST 1 VIEW COMPARISON:  09/08/2020 FINDINGS: Cardiac enlargement without heart failure. Lungs clear without infiltrate or effusion Thoracic scoliosis.  Azygos lobe fissure noted. IMPRESSION: No  active disease. Electronically Signed   By: Marlan Palau M.D.   On: 09/15/2020 12:38   DG Chest Port 1 View  Result Date: 09/08/2020 CLINICAL DATA:  Chest pain. EXAM: PORTABLE CHEST 1 VIEW COMPARISON:  February 13, 2005 FINDINGS: The  cardiac silhouette is mildly enlarged. Both lungs are clear. S-shaped scoliosis of the thoracolumbar spine is seen. IMPRESSION: No acute cardiopulmonary disease. Electronically Signed   By: Aram Candela M.D.   On: 09/08/2020 21:11   CT CORONARY FRACTIONAL FLOW RESERVE FLUID ANALYSIS  Result Date: 09/16/2020 EXAM: CT-FFR ANALYSIS CLINICAL DATA:  Possibly obstructive coronary lesion in LAD FINDINGS: CT-FFR analysis was performed on the original cardiac CT angiogram dataset. Diagrammatic representation of the CT-FFR analysis is provided in a separate PDF document in PACS. This dictation was created using the PDF document and an interactive 3D model of the results. 3D model is not available in the EMR/PACS. Normal FFR range is >0.80. 1. Left Main: No significant functional stenosis, CT-FFR 0.99. 2. LAD: Significant functional stenosis in mid-distal LAD, CT-FFR 0.67. 3. LCX: No significant functional stenosis, CT-FFR 0.92. 4. RCA: No significant functional stenosis, CT-FFR 0.96. 5. Ramus: No significant functional stenosis, CT-FFR 0.89. : 1. CT FFR analysis shows evidence of significant functional stenosis in the LAD. Riley Lam MD Electronically Signed   By: Riley Lam MD   On: 09/16/2020 11:12   ECHOCARDIOGRAM COMPLETE  Result Date: 09/15/2020    ECHOCARDIOGRAM REPORT   Patient Name:   Jennifer [name] Date of Exam: 09/15/2020 Medical Rec #:  [id number]        Height:       65.0 in Accession #:    [id number]       Weight:       201.0 lb Date of Birth:  September 18, 1945        BSA:          1.982 m Patient Age:    74 years         BP:           218/94 mmHg Patient Gender: F                HR:           55 bpm. Exam Location:  [address] Procedure: 2D Echo,  3D Echo, Cardiac Doppler, Color Doppler and Strain Analysis Indications:    R01.1 Murmur  History:        Patient has prior history of Echocardiogram examinations, most                 recent 07/11/2017. Signs/Symptoms:Murmur; Risk                 Factors:Hypertension, Sleep Apnea and Dyslipidemia.  Sonographer:    Garald Braver, RDCS Referring Phys: [id number] JEFFREY C HATCHER IMPRESSIONS  1. Left ventricular ejection fraction, by estimation, is 60 to 65%. The left ventricle has normal function. The left ventricle has no regional wall motion abnormalities. There is moderate left ventricular hypertrophy. Left ventricular diastolic parameters are consistent with Grade I diastolic dysfunction (impaired relaxation). Elevated left atrial pressure. The average left ventricular global longitudinal strain is -20.4 %. The global longitudinal strain is normal.  2. Right ventricular systolic function is normal. The right ventricular size is normal.  3. The mitral valve is normal in structure. Trivial mitral valve regurgitation. No evidence of mitral stenosis.  4. The aortic valve is tricuspid. Aortic valve regurgitation is not visualized. Mild aortic valve stenosis.  5. Aortic dilatation noted. There is mild dilatation of the ascending aorta, measuring 43 mm.  6. The inferior vena cava is normal in size with greater than 50% respiratory variability, suggesting right atrial pressure of 3 mmHg. Comparison(s): 07/11/17 EF 60-65%. PA  pressure . FINDINGS  Left Ventricle: Left ventricular ejection fraction, by estimation, is 60 to 65%. The left ventricle has normal function. The left ventricle has no regional wall motion abnormalities. The average left ventricular global longitudinal strain is -20.4 %. The global longitudinal strain is normal. The left ventricular internal cavity size was normal in size. There is moderate left ventricular hypertrophy. Left ventricular diastolic parameters are consistent with Grade I diastolic  dysfunction (impaired relaxation). Elevated left atrial pressure. Right Ventricle: The right ventricular size is normal. Right ventricular systolic function is normal. Left Atrium: Left atrial size was normal in size. Right Atrium: Right atrial size was normal in size. Pericardium: There is no evidence of pericardial effusion. Mitral Valve: The mitral valve is normal in structure. Trivial mitral valve regurgitation. No evidence of mitral valve stenosis. Tricuspid Valve: The tricuspid valve is normal in structure. Tricuspid valve regurgitation is mild . No evidence of tricuspid stenosis. Aortic Valve: The aortic valve is tricuspid. Aortic valve regurgitation is not visualized. Mild aortic stenosis is present. Aortic valve mean gradient measures 10.7 mmHg. Aortic valve peak gradient measures 20.0 mmHg. Aortic valve area, by VTI measures 2.50 cm. Pulmonic Valve: The pulmonic valve was normal in structure. Pulmonic valve regurgitation is trivial. No evidence of pulmonic stenosis. Aorta: Aortic dilatation noted. There is mild dilatation of the ascending aorta, measuring 43 mm. Venous: The inferior vena cava is normal in size with greater than 50% respiratory variability, suggesting right atrial pressure of 3 mmHg. IAS/Shunts: No atrial level shunt detected by color flow Doppler.  LEFT VENTRICLE PLAX 2D LVIDd:         4.80 cm  Diastology LVIDs:         2.30 cm  LV e' medial:    4.46 cm/s LV PW:         1.20 cm  LV E/e' medial:  17.5 LV IVS:        1.20 cm  LV e' lateral:   5.11 cm/s LVOT diam:     2.10 cm  LV E/e' lateral: 15.3 LV SV:         135 LV SV Index:   68       2D Longitudinal Strain LVOT Area:     3.46 cm 2D Strain GLS (A2C):   -23.5 %                         2D Strain GLS (A3C):   -19.9 %                         2D Strain GLS (A4C):   -17.7 %                         2D Strain GLS Avg:     -20.4 %                          3D Volume EF:                         3D EF:        59 %                         LV EDV:        118 ml  LV ESV:       48 ml                         LV SV:        70 ml RIGHT VENTRICLE RV Basal diam:  3.60 cm RV S prime:     14.70 cm/s TAPSE (M-mode): 2.7 cm LEFT ATRIUM             Index       RIGHT ATRIUM           Index LA diam:        4.30 cm 2.17 cm/m  RA Pressure: 3.00 mmHg LA Vol (A2C):   53.7 ml 27.09 ml/m RA Area:     11.50 cm LA Vol (A4C):   44.3 ml 22.35 ml/m RA Volume:   23.70 ml  11.96 ml/m LA Biplane Vol: 50.0 ml 25.22 ml/m  AORTIC VALVE AV Area (Vmax):    2.12 cm AV Area (Vmean):   1.99 cm AV Area (VTI):     2.50 cm AV Vmax:           223.67 cm/s AV Vmean:          152.000 cm/s AV VTI:            0.541 m AV Peak Grad:      20.0 mmHg AV Mean Grad:      10.7 mmHg LVOT Vmax:         137.00 cm/s LVOT Vmean:        87.400 cm/s LVOT VTI:          0.390 m LVOT/AV VTI ratio: 0.72  AORTA Ao Root diam: 3.50 cm Ao Asc diam:  4.15 cm MITRAL VALVE               TRICUSPID VALVE                            Estimated RAP:  3.00 mmHg  MV E velocity: 78.00 cm/s  SHUNTS MV A velocity: 84.40 cm/s  Systemic VTI:  0.39 m MV E/A ratio:  0.92        Systemic Diam: 2.10 cm Olga Millers MD Electronically signed by Olga Millers MD Signature Date/Time: 09/15/2020/12:53:52 PM    Final      Subjective: No acute issues or events overnight   Discharge Exam: Vitals:   09/27/20 1454 09/27/20 1604  BP: 136/60 (!) 133/56  Pulse:    Resp: 20   Temp: 97.9 F (36.6 C)   SpO2:     Vitals:   09/27/20 0917 09/27/20 0933 09/27/20 1454 09/27/20 1604  BP: (!) 179/66 (!) 179/66 136/60 (!) 133/56  Pulse: 78     Resp: 20  20   Temp: 99.2 F (37.3 C)  97.9 F (36.6 C)   TempSrc: Oral  Axillary   SpO2:      Weight:      Height:        General: Pt is alert, awake, not in acute distress Cardiovascular: RRR, S1/S2 +, no rubs, no gallops Respiratory: CTA bilaterally, no wheezing, no rhonchi Abdominal: Soft, NT, ND, bowel sounds + Extremities: no edema, no cyanosis    The  results of significant diagnostics from this hospitalization (including imaging, microbiology, ancillary and laboratory) are listed below for reference.     Microbiology: No results found for this or any previous visit (from the past  240 hour(s)).   Labs: BNP (last 3 results) Recent Labs    09/25/20 0924  BNP 106.8*   Basic Metabolic Panel: Recent Labs  Lab 09/21/20 0737 09/23/20 0413 09/25/20 0924  NA 139 136 136  K 3.9 3.5 4.0  CL 106 104 103  CO2 GLUCOSE 113* 117* 110*  BUN CREATININE 0.78 0.78 0.84  CALCIUM 8.9 8.5* 8.6*   Liver Function Tests: Recent Labs  Lab 09/25/20 0924  AST 23  ALT 22  ALKPHOS 51  BILITOT 0.6  PROT 6.1*  ALBUMIN 3.5   No results for input(s): LIPASE, AMYLASE in the last 168 hours. No results for input(s): AMMONIA in the last 168 hours. CBC: Recent Labs  Lab 09/25/20 0924 09/26/20 0319  WBC 5.2 6.9  HGB 10.1* 9.8*  HCT 31.9* 30.0*  MCV 90.1 89.6  PLT 303 280   Cardiac Enzymes: No results for input(s): CKTOTAL, CKMB, CKMBINDEX, TROPONINI in the last 168 hours. BNP: Invalid input(s): POCBNP CBG: No results for input(s): GLUCAP in the last 168 hours. D-Dimer No results for input(s): DDIMER in the last 72 hours. Hgb A1c No results for input(s): HGBA1C in the last 72 hours. Lipid Profile No results for input(s): CHOL, HDL, LDLCALC, TRIG, CHOLHDL, LDLDIRECT in the last 72 hours. Thyroid function studies No results for input(s): TSH, T4TOTAL, T3FREE, THYROIDAB in the last 72 hours.  Invalid input(s): FREET3 Anemia work up No results for input(s): VITAMINB12, FOLATE, FERRITIN, TIBC, IRON, RETICCTPCT in the last 72 hours. Urinalysis    Component Value Date/Time   COLORURINE YELLOW 11/26/2019 1540   APPEARANCEUR CLOUDY (A) 11/26/2019 1540   LABSPEC 1.020 11/26/2019 1540   PHURINE 5.5 11/26/2019 1540   GLUCOSEU NEGATIVE 11/26/2019 1540   HGBUR TRACE (A) 11/26/2019 1540   BILIRUBINUR SMALL (A) 02/15/2017  1422   KETONESUR NEGATIVE 11/26/2019 1540   PROTEINUR TRACE (A) 11/26/2019 1540   NITRITE NEGATIVE 11/26/2019 1540   LEUKOCYTESUR 2+ (A) 11/26/2019 1540   Sepsis Labs Invalid input(s): PROCALCITONIN,  WBC,  LACTICIDVEN Microbiology No results found for this or any previous visit (from the past 240 hour(s)).   Time coordinating discharge: Over 30 minutes  SIGNED:   Azucena Fallen, DO [facility] 09/27/2020, 4:44 PM Pager   If 7PM-7AM, please contact night-coverage [email or web address]

## 2020-09-27 NOTE — Progress Notes (Signed)
Pt is alert and oriented. Discharge instructions/ AVS given to pt. 

## 2020-10-03 ENCOUNTER — Emergency Department (HOSPITAL_BASED_OUTPATIENT_CLINIC_OR_DEPARTMENT_OTHER)
Admission: EM | Admit: 2020-10-03 | Discharge: 2020-10-03 | Disposition: A | Discharge status: Home / Self Care | Payer: Medicare Other | Attending: Emergency Medicine | Admitting: Emergency Medicine

## 2020-10-03 ENCOUNTER — Encounter (HOSPITAL_BASED_OUTPATIENT_CLINIC_OR_DEPARTMENT_OTHER): Payer: Self-pay | Admitting: *Deleted

## 2020-10-03 ENCOUNTER — Other Ambulatory Visit: Payer: Self-pay

## 2020-10-03 ENCOUNTER — Emergency Department (HOSPITAL_BASED_OUTPATIENT_CLINIC_OR_DEPARTMENT_OTHER): Payer: Medicare Other | Admitting: Radiology

## 2020-10-03 DIAGNOSIS — R072 Precordial pain: Secondary | ICD-10-CM | POA: Diagnosis not present

## 2020-10-03 DIAGNOSIS — Z79899 Other long term (current) drug therapy: Secondary | ICD-10-CM | POA: Insufficient documentation

## 2020-10-03 DIAGNOSIS — R110 Nausea: Secondary | ICD-10-CM | POA: Diagnosis not present

## 2020-10-03 DIAGNOSIS — E039 Hypothyroidism, unspecified: Secondary | ICD-10-CM | POA: Insufficient documentation

## 2020-10-03 DIAGNOSIS — I110 Hypertensive heart disease with heart failure: Secondary | ICD-10-CM | POA: Diagnosis not present

## 2020-10-03 DIAGNOSIS — R531 Weakness: Secondary | ICD-10-CM | POA: Diagnosis not present

## 2020-10-03 DIAGNOSIS — R0789 Other chest pain: Secondary | ICD-10-CM | POA: Diagnosis not present

## 2020-10-03 DIAGNOSIS — I5032 Chronic diastolic (congestive) heart failure: Secondary | ICD-10-CM | POA: Diagnosis not present

## 2020-10-03 DIAGNOSIS — R079 Chest pain, unspecified: Secondary | ICD-10-CM | POA: Diagnosis not present

## 2020-10-03 DIAGNOSIS — Z7902 Long term (current) use of antithrombotics/antiplatelets: Secondary | ICD-10-CM | POA: Insufficient documentation

## 2020-10-03 DIAGNOSIS — R002 Palpitations: Secondary | ICD-10-CM | POA: Insufficient documentation

## 2020-10-03 LAB — CBC
HCT: 32.5 % — ABNORMAL LOW (ref 36.0–46.0)
Hemoglobin: 10.6 g/dL — ABNORMAL LOW (ref 12.0–15.0)
MCH: 28.9 pg (ref 26.0–34.0)
MCHC: 32.6 g/dL (ref 30.0–36.0)
MCV: 88.6 fL (ref 80.0–100.0)
Platelets: 273 10*3/uL (ref 150–400)
RBC: 3.67 MIL/uL — ABNORMAL LOW (ref 3.87–5.11)
RDW: 13.6 % (ref 11.5–15.5)
WBC: 6.5 10*3/uL (ref 4.0–10.5)
nRBC: 0 % (ref 0.0–0.2)

## 2020-10-03 LAB — BASIC METABOLIC PANEL
Anion gap: 11 (ref 5–15)
BUN: 11 mg/dL (ref 8–23)
CO2: 26 mmol/L (ref 22–32)
Calcium: 9 mg/dL (ref 8.9–10.3)
Chloride: 104 mmol/L (ref 98–111)
Creatinine, Ser: 0.69 mg/dL (ref 0.44–1.00)
GFR, Estimated: >60 mL/min (ref >60)
Glucose, Bld: 111 mg/dL — ABNORMAL HIGH (ref 70–99)
Potassium: 3.6 mmol/L (ref 3.5–5.1)
Sodium: 141 mmol/L (ref 135–145)

## 2020-10-03 LAB — TROPONIN I (HIGH SENSITIVITY)
Troponin I (High Sensitivity): 22 ng/L — ABNORMAL HIGH (ref <18)
Troponin I (High Sensitivity): 21 ng/L — ABNORMAL HIGH (ref <18)

## 2020-10-03 NOTE — ED Triage Notes (Signed)
Pt was recently hospitalized with Cardiac cath which showed a blockage but could not stint due to HTN, Placed on medications. Episode of chest pain and left arm tingling. Weakness, nausea without emesis and chills.

## 2020-10-03 NOTE — Discharge Instructions (Addendum)
Work-up here today without any acute findings chest wise.  Return for new or worse symptoms.  Also follow-up with cardiology as scheduled.

## 2020-10-03 NOTE — ED Provider Notes (Signed)
[facility]   CSN: [id number] Arrival date & time: 10/03/20  1012     History Chief Complaint  Patient presents with   Chest Pain    Jennifer ReamerBarbara E Lucarelli is a 75 y.o. female.  Patient recently hospitalized for cardiac cath which shows blockage.  They wanted to stent her LAD but it was too torturous to they can get that confirmed.  Cardiology decided that it would be medical treatment.  Medication changes were made.  Patient was admitted June 16 to June 28.  Had a heart catheterization on June 17.  Patient presenting with the onset of episodic anterior chest pain with some nausea but no vomiting no chills.  Chest pain has been persistent.  She also has follow-up with GI medicine in case this is GERD related.  But her concern is the chest pain.  He gets some palpitations when it occurred started during the night.  Patient thinks it may be anxiety.      Past Medical History:  Diagnosis Date   Acute meniscal tear of left knee    FOLLOWED BY DR GIAFFREY   BPPV (benign paroxysmal positional vertigo)    Chronic bladder pain    Chronic fatigue syndrome    Chronic low back pain    W/ RIGHT LEG PAIN AND NUMBNESS   Cystitis, chronic    Fibromyalgia    GERD (gastroesophageal reflux disease)    Hiatal hernia    Hypertension    IC (interstitial cystitis)    OSA (obstructive sleep apnea)    per pt study yrs ago-- moderate osa ,  intolerant cpap   Pinched vertebral nerve    bilateral L2 -- L3 and L3 -- L4-/  epidural injection's treatment , PT and pain clince   PONV (postoperative nausea and vomiting)    severe   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    Self-catheterizes urinary bladder    Spinal stenosis, lumbar region with neurogenic claudication    Wears glasses    Wears partial dentures    upper and lower    Patient Active Problem List   Diagnosis Date Noted   Hypertensive crisis 09/16/2020   Unstable  angina (HCC)    Chest pain 09/15/2020   Hypertensive emergency 09/15/2020   Hypertensive urgency 09/15/2020   Interstitial cystitis (chronic) without hematuria 09/15/2020   Chronic low back pain 09/15/2020   Chronic diastolic CHF (congestive heart failure) (HCC) 09/15/2020   Heart murmur 09/08/2020   Allergic rhinitis 08/17/2020   Altered bowel function 08/17/2020   Anxiety disorder 08/17/2020   Body mass index (BMI) 35.0-35.9, adult 08/17/2020   Chronic fatigue syndrome 08/17/2020   Colon cancer screening 08/17/2020   Functional diarrhea 08/17/2020   Hypothyroidism 08/17/2020   Intestinal gas excretion 08/17/2020   Localized obesity 08/17/2020   Nausea 08/17/2020   Polypharmacy 08/17/2020   Posttraumatic headache 08/17/2020   Posttraumatic vertigo 08/17/2020   Pure hypercholesterolemia 08/17/2020   Lower abdominal pain 08/17/2020   Scoliosis 08/17/2020   Sleep apnea 08/17/2020   Unspecified abnormal finding in specimens from other organs, systems and tissues 08/17/2020   Vitamin D deficiency 08/17/2020   Back pain 04/28/2020   Candidiasis 10/15/2019   Encounter for orthopedic follow-up care 08/12/2018   Vertigo 02/26/2018   Degenerative arthritis of right knee 02/19/2018   Urinary tract infection, site not specified 07/04/2017   Arthralgia of both knees 01/23/2017   Facet arthritis, degenerative, lumbar  spine 01/23/2017   Benign paroxysmal positional vertigo 07/03/2016   IBS (irritable bowel syndrome) 04/19/2015   GERD (gastroesophageal reflux disease) 03/21/2015   Fibromyalgia 03/21/2015   Recurrent bacterial cystitis 03/21/2015   HTN (hypertension) 03/21/2015   Fibrositis 03/21/2015   SI (sacroiliac) pain 12/16/2014   DDD (degenerative disc disease), lumbar 05/11/2014   Facet arthropathy 05/11/2014   Spinal stenosis of lumbar region 05/11/2014    Past Surgical History:  Procedure Laterality Date   ABDOMINAL AORTOGRAM N/A 09/16/2020   Procedure: ABDOMINAL  AORTOGRAM;  Surgeon: Iran Ouch, MD;  Location: [facility];  Service: Cardiovascular;  Laterality: N/A;   ABDOMINAL HYSTERECTOMY  1980   w/ Left Salpingoophorectomy   CARDIAC CATHETERIZATION  08-21-2001  dr Verdis Prime   normal coronary arteries and LVF   CARPAL TUNNEL RELEASE Bilateral 1986   CECALCYSTOPLASTY/  APPENDECTOMY  1984   at [facility]   CHOLECYSTECTOMY  1992   CYSTO WITH HYDRODISTENSION N/A 01/12/2016   Procedure: CYSTOSCOPY/HYDRODISTENSION;  Surgeon: Jethro Bolus, MD;  Location: [facility];  Service: Urology;  Laterality: N/A;   CYSTO WITH HYDRODISTENSION N/A 08/30/2016   Procedure: CYSTOSCOPY/HYDRODISTENSION OF BLADDER INJECTION OF MARCAINE/PYRIDIUM;  Surgeon: Jethro Bolus, MD;  Location: [facility];  Service: Urology;  Laterality: N/A;   LEFT HEART CATH AND CORONARY ANGIOGRAPHY N/A 09/16/2020   Procedure: LEFT HEART CATH AND CORONARY ANGIOGRAPHY;  Surgeon: Iran Ouch, MD;  Location: [facility];  Service: Cardiovascular;  Laterality: N/A;   MULTIPLE CYSTO/  HYDRODISTENTION/  INSTILLATION THERAPY  last one 08-19-2009   NASAL SINUS SURGERY  1987   TONSILLECTOMY  as child   VAGINAL GROWTH REMOVED  as teen     OB History   No obstetric history on file.     Family History  Problem Relation Age of Onset   CAD Father    Asthma Father    Hypertension Father    Alzheimer's disease Father    Emphysema Father    COPD Father    Heart failure Mother    Peripheral vascular disease Mother    Stroke Mother    Hypertension Mother    Colonic polyp Mother    Cancer Brother        melanoma    Social History   Tobacco Use   Smoking status: Never   Smokeless tobacco: Never  Vaping Use   Vaping Use: Never used  Substance Use Topics   Alcohol use: No    Alcohol/week: 0.0 standard drinks   Drug use: No    Home Medications Prior to Admission medications   Medication Sig Start Date End Date  Taking? Authorizing Provider  acetaminophen (TYLENOL) 500 MG tablet Take 500 mg by mouth 2 (two) times daily as needed (for pain).   Yes [provider]  atorvastatin (LIPITOR) 40 MG tablet Take 1 tablet (40 mg total) by mouth daily. 09/27/20  Yes Azucena Fallen, MD  clopidogrel (PLAVIX) 75 MG tablet Take 1 tablet (75 mg total) by mouth daily. 09/27/20  Yes Azucena Fallen, MD  D-Mannose 500 MG CAPS Take 500-1,500 mg by mouth 3 (three) times daily.   Yes [provider]  doxazosin (CARDURA) 1 MG tablet Take 1 tablet (1 mg total) by mouth daily. 09/27/20  Yes Azucena Fallen, MD  fexofenadine (ALLEGRA) 60 MG tablet Take 60 mg by mouth See admin instructions. Take 60 mg by mouth one to two times a day as needed for allergy  symptoms 01/06/14  Yes [provider]  hydrALAZINE (APRESOLINE) 25 MG tablet Take 3 tablets (75 mg total) by mouth every 8 (eight) hours. 09/27/20  Yes Azucena Fallen, MD  HYDROcodone-acetaminophen (NORCO) 7.5-325 MG tablet Take 1 tablet by mouth See admin instructions. Take 1 tablet by mouth three to four times a day 02/07/15  Yes [provider]  isosorbide dinitrate (ISORDIL) 10 MG tablet Take 1 tablet (10 mg total) by mouth 3 (three) times daily. 09/27/20  Yes Azucena Fallen, MD  NIFEdipine (ADALAT CC) 30 MG 24 hr tablet Take 1 tablet (30 mg total) by mouth daily. 09/27/20  Yes Azucena Fallen, MD  omeprazole (PRILOSEC) 20 MG capsule Take 20 mg by mouth See admin instructions. Take 20 mg by mouth in the morning one hour before breakfast and an additional 20 mg before evening meal as needed for GERD 02/08/14  Yes [provider]  spironolactone (ALDACTONE) 25 MG tablet Take 1 tablet (25 mg total) by mouth daily. 09/27/20  Yes Azucena Fallen, MD  sucralfate (CARAFATE) 1 G tablet Take 1 g by mouth See admin instructions. Take 1 gram by mouth in the morning and an additional 1 gram once a day for flares   Yes  [provider]  fluconazole (DIFLUCAN) 100 MG tablet Take 1 tablet (100 mg total) by mouth daily. 08/04/20   Ginnie Smart, MD  fosfomycin (MONUROL) 3 g PACK Take 3 g by mouth once. 08/10/19   [provider]  ipratropium (ATROVENT) 0.06 % nasal spray Place 2 sprays into both nostrils 2 (two) times daily as needed for rhinitis.    [provider]  losartan (COZAAR) 100 MG tablet Take 100 mg by mouth daily.    [provider]  meclizine (ANTIVERT) 25 MG tablet Take 25 mg by mouth 3 (three) times daily as needed for dizziness.    [provider]  Meth-Hyo-M Bl-Benz Acd-Ph Sal 81.6 MG TABS Take 1 tablet (81.6 mg total) by mouth 2 (two) times daily as needed. 06/07/17   Ginnie Smart, MD  methenamine (HIPREX) 1 G tablet Take by mouth as directed. TAKES WHEN NOT TAKING ANTIBIOTICS    [provider]  nitroGLYCERIN (NITROSTAT) 0.4 MG SL tablet Place 1 tablet (0.4 mg total) under the tongue every 5 (five) minutes as needed for chest pain. 09/27/20   Azucena Fallen, MD  nystatin cream (MYCOSTATIN) Apply topically 2 (two) times daily. 08/04/20   Ginnie Smart, MD  ondansetron (ZOFRAN-ODT) 4 MG disintegrating tablet Take 4 mg by mouth every 8 (eight) hours as needed for nausea (dissolve orally). 07/01/17   [provider]  ranolazine (RANEXA) 500 MG 12 hr tablet Take 1 tablet (500 mg total) by mouth 2 (two) times daily. 09/27/20   Azucena Fallen, MD  triamcinolone cream (KENALOG) 0.1 % Apply 1 application topically daily as needed (for eczema). 02/03/15   [provider]    Allergies    Moxifloxacin, Quinolones, Sulfa antibiotics, Furosemide, Aspirin, Duloxetine hcl, Hydrochlorothiazide, Ibuprofen, Other, Robaxin [methocarbamol], Topiramate, Zofran [ondansetron hcl], Codeine, Dimethyl sulfoxide, Macrodantin [nitrofurantoin macrocrystal], Nitrofurantoin, Tape, and Yellow dye  Review of Systems   Review of Systems   Constitutional:  Negative for chills and fever.  HENT:  Negative for ear pain and sore throat.   Eyes:  Negative for pain and visual disturbance.  Respiratory:  Negative for cough and shortness of breath.   Cardiovascular:  Positive for chest pain. Negative for palpitations.  Gastrointestinal:  Positive for nausea. Negative for abdominal pain and vomiting.  Genitourinary:  Negative for dysuria and hematuria.  Musculoskeletal:  Negative for arthralgias and back pain.  Skin:  Negative for color change and rash.  Neurological:  Negative for seizures and syncope.  Psychiatric/Behavioral:  The patient is nervous/anxious.   All other systems reviewed and are negative.  Physical Exam Updated Vital Signs BP (!) 168/78   Pulse 75   Temp 99.3 F (37.4 C) (Oral)   Resp 15   Ht 1.651 m ( )   Wt 90.7 kg   SpO2 99%   BMI 33.28 kg/m   Physical Exam Vitals and nursing note reviewed.  Constitutional:      General: She is not in acute distress.    Appearance: Normal appearance. She is well-developed. She is not ill-appearing.  HENT:     Head: Normocephalic and atraumatic.  Eyes:     Extraocular Movements: Extraocular movements intact.     Conjunctiva/sclera: Conjunctivae normal.     Pupils: Pupils are equal, round, and reactive to light.  Cardiovascular:     Rate and Rhythm: Normal rate and regular rhythm.     Heart sounds: No murmur heard. Pulmonary:     Effort: Pulmonary effort is normal. No respiratory distress.     Breath sounds: Normal breath sounds.  Chest:     Chest wall: No tenderness.  Abdominal:     Palpations: Abdomen is soft.     Tenderness: There is no abdominal tenderness.  Musculoskeletal:        General: No swelling.     Cervical back: Normal range of motion and neck supple.  Skin:    General: Skin is warm and dry.  Neurological:     General: No focal deficit present.     Mental Status: She is alert and oriented to person, place, and time.    ED Results /  Procedures / Treatments   Labs (all labs ordered are listed, but only abnormal results are displayed) Labs Reviewed  BASIC METABOLIC PANEL - Abnormal; Notable for the following components:      Result Value   Glucose, Bld 111 (*)    All other components within normal limits  CBC - Abnormal; Notable for the following components:   RBC 3.67 (*)    Hemoglobin 10.6 (*)    HCT 32.5 (*)    All other components within normal limits  TROPONIN I (HIGH SENSITIVITY) - Abnormal; Notable for the following components:   Troponin I (High Sensitivity) 22 (*)    All other components within normal limits  TROPONIN I (HIGH SENSITIVITY) - Abnormal; Notable for the following components:   Troponin I (High Sensitivity) 21 (*)    All other components within normal limits    EKG None EKG not crossing over.  But no significant change since the last EKG on 26 June.  Radiology DG Chest 2 View  Result Date: 10/03/2020 CLINICAL DATA:  Per nurse: Pt was recently hospitalized with Cardiac cath which showed a blockage but could not stint due to HTN, Placed on medications. Episode of chest pain and left arm tingling. Weakness, nausea without emesis and chills. EXAM: CHEST - 2 VIEW COMPARISON:  09/15/2020 FINDINGS: Cardiac silhouette is normal in size. No mediastinal or hilar masses or evidence of adenopathy. Lungs are hyperexpanded, but clear. No pleural effusion or pneumothorax. Skeletal structures are intact. IMPRESSION: No active cardiopulmonary disease. Electronically Signed   By: Amie Portland M.D.   On:  10/03/2020 11:00    Procedures Procedures   Medications Ordered in ED Medications - No data to display  ED Course  I have reviewed the triage vital signs and the nursing notes.  Pertinent labs & imaging results that were available during my care of the patient were reviewed by me and considered in my medical decision making (see chart for details).    MDM Rules/Calculators/A&P                           Work-up here reassuring initial troponin was 22.  Repeat troponin was 21.  EKG without acute changes.  Chest x-ray negative.  No leukocytosis hemoglobin 10.6.  Work-up reassuring for no acute cardiac event.  Patient instructed to return for any new or worse symptoms from what she has been experiencing.  Follow-up with cardiology as planned.  Follow-up with GI medicine as planned.  Final Clinical Impression(s) / ED Diagnoses Final diagnoses:  Precordial pain    Rx / DC Orders ED Discharge Orders     None        Vanetta Mulders, MD 10/03/20 1727

## 2020-10-10 DIAGNOSIS — R6883 Chills (without fever): Secondary | ICD-10-CM | POA: Diagnosis not present

## 2020-10-10 DIAGNOSIS — I10 Essential (primary) hypertension: Secondary | ICD-10-CM | POA: Diagnosis not present

## 2020-10-10 DIAGNOSIS — I160 Hypertensive urgency: Secondary | ICD-10-CM | POA: Diagnosis not present

## 2020-10-14 DIAGNOSIS — J069 Acute upper respiratory infection, unspecified: Secondary | ICD-10-CM | POA: Diagnosis not present

## 2020-10-14 DIAGNOSIS — R5381 Other malaise: Secondary | ICD-10-CM | POA: Diagnosis not present

## 2020-10-14 DIAGNOSIS — R5383 Other fatigue: Secondary | ICD-10-CM | POA: Diagnosis not present

## 2020-10-14 DIAGNOSIS — R509 Fever, unspecified: Secondary | ICD-10-CM | POA: Diagnosis not present

## 2020-10-15 DIAGNOSIS — J069 Acute upper respiratory infection, unspecified: Secondary | ICD-10-CM | POA: Diagnosis not present

## 2020-10-15 DIAGNOSIS — R509 Fever, unspecified: Secondary | ICD-10-CM | POA: Diagnosis not present

## 2020-10-15 DIAGNOSIS — R5383 Other fatigue: Secondary | ICD-10-CM | POA: Diagnosis not present

## 2020-10-15 DIAGNOSIS — Z03818 Encounter for observation for suspected exposure to other biological agents ruled out: Secondary | ICD-10-CM | POA: Diagnosis not present

## 2020-10-19 ENCOUNTER — Other Ambulatory Visit: Payer: Self-pay

## 2020-10-19 ENCOUNTER — Encounter (HOSPITAL_BASED_OUTPATIENT_CLINIC_OR_DEPARTMENT_OTHER): Payer: Self-pay | Admitting: Emergency Medicine

## 2020-10-19 DIAGNOSIS — K219 Gastro-esophageal reflux disease without esophagitis: Secondary | ICD-10-CM | POA: Diagnosis not present

## 2020-10-19 DIAGNOSIS — M546 Pain in thoracic spine: Secondary | ICD-10-CM | POA: Diagnosis not present

## 2020-10-19 DIAGNOSIS — R531 Weakness: Secondary | ICD-10-CM | POA: Diagnosis not present

## 2020-10-19 DIAGNOSIS — Z20822 Contact with and (suspected) exposure to covid-19: Secondary | ICD-10-CM | POA: Diagnosis not present

## 2020-10-19 DIAGNOSIS — Z79899 Other long term (current) drug therapy: Secondary | ICD-10-CM | POA: Insufficient documentation

## 2020-10-19 DIAGNOSIS — Z7902 Long term (current) use of antithrombotics/antiplatelets: Secondary | ICD-10-CM | POA: Diagnosis not present

## 2020-10-19 DIAGNOSIS — I959 Hypotension, unspecified: Secondary | ICD-10-CM | POA: Diagnosis not present

## 2020-10-19 DIAGNOSIS — R112 Nausea with vomiting, unspecified: Secondary | ICD-10-CM | POA: Diagnosis not present

## 2020-10-19 DIAGNOSIS — R110 Nausea: Secondary | ICD-10-CM | POA: Insufficient documentation

## 2020-10-19 DIAGNOSIS — R630 Anorexia: Secondary | ICD-10-CM | POA: Diagnosis not present

## 2020-10-19 DIAGNOSIS — I10 Essential (primary) hypertension: Secondary | ICD-10-CM | POA: Diagnosis not present

## 2020-10-19 DIAGNOSIS — E7800 Pure hypercholesterolemia, unspecified: Secondary | ICD-10-CM | POA: Diagnosis not present

## 2020-10-19 DIAGNOSIS — R0902 Hypoxemia: Secondary | ICD-10-CM | POA: Diagnosis not present

## 2020-10-19 DIAGNOSIS — R197 Diarrhea, unspecified: Secondary | ICD-10-CM | POA: Diagnosis not present

## 2020-10-19 DIAGNOSIS — E039 Hypothyroidism, unspecified: Secondary | ICD-10-CM | POA: Diagnosis not present

## 2020-10-19 DIAGNOSIS — R509 Fever, unspecified: Secondary | ICD-10-CM | POA: Diagnosis not present

## 2020-10-19 LAB — CBC
HCT: 30.1 % — ABNORMAL LOW (ref 36.0–46.0)
Hemoglobin: 9.8 g/dL — ABNORMAL LOW (ref 12.0–15.0)
MCH: 28.2 pg (ref 26.0–34.0)
MCHC: 32.6 g/dL (ref 30.0–36.0)
MCV: 86.5 fL (ref 80.0–100.0)
Platelets: 354 10*3/uL (ref 150–400)
RBC: 3.48 MIL/uL — ABNORMAL LOW (ref 3.87–5.11)
RDW: 13.7 % (ref 11.5–15.5)
WBC: 17 10*3/uL — ABNORMAL HIGH (ref 4.0–10.5)
nRBC: 0 % (ref 0.0–0.2)

## 2020-10-19 LAB — BASIC METABOLIC PANEL
Anion gap: 12 (ref 5–15)
BUN: 9 mg/dL (ref 8–23)
CO2: 32 mmol/L (ref 22–32)
Calcium: 8.1 mg/dL — ABNORMAL LOW (ref 8.9–10.3)
Chloride: 97 mmol/L — ABNORMAL LOW (ref 98–111)
Creatinine, Ser: 0.6 mg/dL (ref 0.44–1.00)
GFR, Estimated: >60 mL/min (ref >60)
Glucose, Bld: 110 mg/dL — ABNORMAL HIGH (ref 70–99)
Potassium: 2.8 mmol/L — ABNORMAL LOW (ref 3.5–5.1)
Sodium: 141 mmol/L (ref 135–145)

## 2020-10-19 LAB — CBG MONITORING, ED: Glucose-Capillary: 115 mg/dL — ABNORMAL HIGH (ref 70–99)

## 2020-10-19 LAB — URINALYSIS, ROUTINE W REFLEX MICROSCOPIC
Bilirubin Urine: NEGATIVE
Glucose, UA: NEGATIVE mg/dL
Hgb urine dipstick: NEGATIVE
Ketones, ur: NEGATIVE mg/dL
Nitrite: NEGATIVE
Specific Gravity, Urine: 1.007 (ref 1.005–1.030)
pH: 6.5 (ref 5.0–8.0)

## 2020-10-19 NOTE — ED Triage Notes (Signed)
Pt via ems from home with generalized weakness since June (started on many meds for HTN); and fever today. Pt states she began spiking fever today, was 102.5 this morning. Pt had covid test on 7/16 that was negative. Pt states she was admitted for cardiac issues in June and was told she needed to have a cath/stent, but that her BP was too high for it. Pt states she is "unable to tolerate" her many bp meds and has a cardiology appt on Tuesday, 7/26. Pt alert & oriented, nad noted.

## 2020-10-19 NOTE — ED Triage Notes (Signed)
Pt home via ems w/gen wkness and fever. States she was d/c from hospital 2 weeks ago and has fever ever since. Pt tested for covid at home on [id number] - negative. Would like covid test. Hx of uti. Hx HTN. 138/56 HR 80 SPo2 965 CBG 139 per EMS

## 2020-10-19 NOTE — ED Notes (Signed)
RT placed PIV in left anterior forearm w/out difficulty. PIV flushed, saline locked, secured.

## 2020-10-20 ENCOUNTER — Emergency Department (HOSPITAL_BASED_OUTPATIENT_CLINIC_OR_DEPARTMENT_OTHER)
Admission: EM | Admit: 2020-10-20 | Discharge: 2020-10-20 | Disposition: A | Discharge status: Home / Self Care | Payer: Medicare Other | Attending: Emergency Medicine | Admitting: Emergency Medicine

## 2020-10-20 DIAGNOSIS — R509 Fever, unspecified: Secondary | ICD-10-CM

## 2020-10-20 DIAGNOSIS — R110 Nausea: Secondary | ICD-10-CM

## 2020-10-20 DIAGNOSIS — R531 Weakness: Secondary | ICD-10-CM | POA: Diagnosis not present

## 2020-10-20 LAB — RESP PANEL BY RT-PCR (FLU A&B, COVID) ARPGX2
Influenza A by PCR: NEGATIVE
Influenza B by PCR: NEGATIVE
SARS Coronavirus 2 by RT PCR: NEGATIVE

## 2020-10-20 MED ORDER — PROMETHAZINE HCL 25 MG PO TABS: 25.0000 mg | ORAL_TABLET | Freq: Four times a day (QID) | ORAL | 0 refills | Status: DC | PRN

## 2020-10-20 MED ORDER — POTASSIUM CHLORIDE CRYS ER 20 MEQ PO TBCR
40.0000 meq | EXTENDED_RELEASE_TABLET | Freq: Once | ORAL | Status: AC
  Administered 2020-10-20: 40 meq via ORAL
  Filled 2020-10-20: qty 2

## 2020-10-20 NOTE — ED Provider Notes (Signed)
[facility] Provider Note: Jennifer Dell, MD, FACEP  CSN: [id number] MRN: [id number] ARRIVAL: 10/19/20 at 1953 ROOM: DB011/DB011   CHIEF COMPLAINT  Weakness   HISTORY OF PRESENT ILLNESS  10/20/20 1:04 AM Jennifer [name] is a 75 y.o. female who was hospitalized in June for hypertensive emergency.  She had an extensive cardiac work-up which showed blockage but cardiology decided to treat her medically as she was not a good surgical candidate.  She states she was started on 9 antihypertensives:    Since being discharged she has had generalized weakness, intermittent nausea, intermittent diarrhea and loss of appetite which she attributes to the "20 pills" she takes every day.  She has also been having palpitations (rapid heartbeat into the 90s) with exertion.  Last week, July 9 through 11, 2022, she had a fever and general malaise.  She was tested for COVID at an urgent care 5 days ago which was negative.  Yesterday she developed a new fever and chills.  Her fever has been as high as 102.9 and she has been taking Tylenol for this.  She has not had any cough or shortness of breath and has not had any loss of taste or smell.  She has had a burning sensation in her upper back which is different than her usual lumbar pain.  She took nitroglycerin in case this was a sudden anginal equivalent (she is on nitroglycerin as part of the medical management of her coronary artery disease) with equivocal relief.  She self catheterizes status post extensive bladder reconstruction in 1984.  She also has a congenital nystagmus.  Past Medical History:  Diagnosis Date   Acute meniscal tear of left knee    FOLLOWED BY DR GIAFFREY   BPPV (benign paroxysmal positional vertigo)    Chronic bladder pain    Chronic fatigue syndrome    Chronic low back pain    W/ RIGHT LEG PAIN AND NUMBNESS   Cystitis, chronic    Fibromyalgia    GERD (gastroesophageal reflux disease)    Hiatal hernia    Hypertension     IC (interstitial cystitis)    OSA (obstructive sleep apnea)    per pt study yrs ago-- moderate osa ,  intolerant cpap   Pinched vertebral nerve    bilateral L2 -- L3 and L3 -- L4-/  epidural injection's treatment , PT and pain clince   PONV (postoperative nausea and vomiting)    severe   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    Self-catheterizes urinary bladder    Spinal stenosis, lumbar region with neurogenic claudication    Wears glasses    Wears partial dentures    upper and lower    Past Surgical History:  Procedure Laterality Date   ABDOMINAL AORTOGRAM N/A 09/16/2020   Procedure: ABDOMINAL AORTOGRAM;  Surgeon: Iran Ouch, MD;  Location: [facility];  Service: Cardiovascular;  Laterality: N/A;   ABDOMINAL HYSTERECTOMY  1980   w/ Left Salpingoophorectomy   CARDIAC CATHETERIZATION  08-21-2001  dr Verdis Prime   normal coronary arteries and LVF   CARPAL TUNNEL RELEASE Bilateral 1986   CECALCYSTOPLASTY/  APPENDECTOMY  1984   at [facility]   CHOLECYSTECTOMY  1992   CYSTO WITH HYDRODISTENSION N/A 01/12/2016   Procedure: CYSTOSCOPY/HYDRODISTENSION;  Surgeon: Jethro Bolus, MD;  Location: [facility];  Service: Urology;  Laterality: N/A;   CYSTO WITH HYDRODISTENSION N/A 08/30/2016   Procedure: CYSTOSCOPY/HYDRODISTENSION  OF BLADDER INJECTION OF MARCAINE/PYRIDIUM;  Surgeon: Jethro Bolusannenbaum, Sigmund, MD;  [facility];  Service: Urology;  Laterality: N/A;   LEFT HEART CATH AND CORONARY ANGIOGRAPHY N/A 09/16/2020   Procedure: LEFT HEART CATH AND CORONARY ANGIOGRAPHY;  Surgeon: Iran OuchArida, Muhammad A, MD;  Location: [facility];  Service: Cardiovascular;  Laterality: N/A;   MULTIPLE CYSTO/  HYDRODISTENTION/  INSTILLATION THERAPY  last one 08-19-2009   NASAL SINUS SURGERY  1987   TONSILLECTOMY  as child   VAGINAL GROWTH REMOVED  as teen    Family History  Problem Relation Age of Onset   CAD Father     Asthma Father    Hypertension Father    Alzheimer's disease Father    Emphysema Father    COPD Father    Heart failure Mother    Peripheral vascular disease Mother    Stroke Mother    Hypertension Mother    Colonic polyp Mother    Cancer Brother        melanoma    Social History   Tobacco Use   Smoking status: Never   Smokeless tobacco: Never  Vaping Use   Vaping Use: Never used  Substance Use Topics   Alcohol use: No    Alcohol/week: 0.0 standard drinks   Drug use: No    Prior to Admission medications   Medication Sig Start Date End Date Taking? Authorizing Provider  promethazine (PHENERGAN) 25 MG tablet Take 1 tablet (25 mg total) by mouth every 6 (six) hours as needed for refractory nausea / vomiting. 10/20/20  Yes Sagar Tengan, MD  acetaminophen (TYLENOL) 500 MG tablet Take 500 mg by mouth 2 (two) times daily as needed (for pain).    [provider]  atorvastatin (LIPITOR) 40 MG tablet Take 1 tablet (40 mg total) by mouth daily. 09/27/20   Azucena FallenLancaster, William C, MD  clopidogrel (PLAVIX) 75 MG tablet Take 1 tablet (75 mg total) by mouth daily. 09/27/20   Azucena FallenLancaster, William C, MD  D-Mannose 500 MG CAPS Take 500-1,500 mg by mouth 3 (three) times daily.    [provider]  doxazosin (CARDURA) 1 MG tablet Take 1 tablet (1 mg total) by mouth daily. 09/27/20   Azucena FallenLancaster, William C, MD  fexofenadine (ALLEGRA) 60 MG tablet Take 60 mg by mouth See admin instructions. Take 60 mg by mouth one to two times a day as needed for allergy symptoms 01/06/14   [provider]  fluconazole (DIFLUCAN) 100 MG tablet Take 1 tablet (100 mg total) by mouth daily. 08/04/20   Ginnie SmartHatcher, Jeffrey C, MD  fosfomycin (MONUROL) 3 g PACK Take 3 g by mouth once. 08/10/19   [provider]  hydrALAZINE (APRESOLINE) 25 MG tablet Take 3 tablets (75 mg total) by mouth every 8 (eight) hours. 09/27/20   Azucena FallenLancaster, William C, MD  ipratropium (ATROVENT) 0.06 % nasal spray Place 2 sprays  into both nostrils 2 (two) times daily as needed for rhinitis.    [provider]  isosorbide dinitrate (ISORDIL) 10 MG tablet Take 1 tablet (10 mg total) by mouth 3 (three) times daily. 09/27/20   Azucena FallenLancaster, William C, MD  losartan (COZAAR) 100 MG tablet Take 100 mg by mouth daily.    [provider]  meclizine (ANTIVERT) 25 MG tablet Take 25 mg by mouth 3 (three) times daily as needed for dizziness.    [provider]  Meth-Hyo-M Bl-Benz Acd-Ph Sal 81.6 MG TABS Take 1 tablet (81.6 mg total) by mouth  2 (two) times daily as needed. 06/07/17   Ginnie Smart, MD  methenamine (HIPREX) 1 G tablet Take by mouth as directed. TAKES WHEN NOT TAKING ANTIBIOTICS    [provider]  NIFEdipine (ADALAT CC) 30 MG 24 hr tablet Take 1 tablet (30 mg total) by mouth daily. 09/27/20   Azucena Fallen, MD  nitroGLYCERIN (NITROSTAT) 0.4 MG SL tablet Place 1 tablet (0.4 mg total) under the tongue every 5 (five) minutes as needed for chest pain. 09/27/20   Azucena Fallen, MD  nystatin cream (MYCOSTATIN) Apply topically 2 (two) times daily. 08/04/20   Ginnie Smart, MD  omeprazole (PRILOSEC) 20 MG capsule Take 20 mg by mouth See admin instructions. Take 20 mg by mouth in the morning one hour before breakfast and an additional 20 mg before evening meal as needed for GERD 02/08/14   [provider]  ondansetron (ZOFRAN-ODT) 4 MG disintegrating tablet Take 4 mg by mouth every 8 (eight) hours as needed for nausea (dissolve orally). 07/01/17   [provider]  ranolazine (RANEXA) 500 MG 12 hr tablet Take 1 tablet (500 mg total) by mouth 2 (two) times daily. 09/27/20   Azucena Fallen, MD  spironolactone (ALDACTONE) 25 MG tablet Take 1 tablet (25 mg total) by mouth daily. 09/27/20   Azucena Fallen, MD  sucralfate (CARAFATE) 1 G tablet Take 1 g by mouth See admin instructions. Take 1 gram by mouth in the morning and an additional 1 gram once a day for flares     [provider]  triamcinolone cream (KENALOG) 0.1 % Apply 1 application topically daily as needed (for eczema). 02/03/15   [provider]    Allergies Moxifloxacin, Quinolones, Sulfa antibiotics, Furosemide, Aspirin, Duloxetine hcl, Hydrochlorothiazide, Ibuprofen, Other, Robaxin [methocarbamol], Topiramate, Zofran [ondansetron hcl], Codeine, Dimethyl sulfoxide, Macrodantin [nitrofurantoin macrocrystal], Nitrofurantoin, Tape, and Yellow dye   REVIEW OF SYSTEMS  Negative except as noted here or in the History of Present Illness.   PHYSICAL EXAMINATION  Initial Vital Signs Blood pressure (!) 151/79, pulse 79, temperature 99.1 F (37.3 C), temperature source Oral, resp. rate 18, height 5' 4.5" (1.638 m), weight 88.3 kg, SpO2 97 %.  Examination General: Well-developed, well-nourished female in no acute distress; appearance consistent with age of record HENT: normocephalic; atraumatic Eyes: pupils equal, round and reactive to light; extraocular muscles intact; resting nystagmus Neck: supple Heart: regular rate and rhythm Lungs: clear to auscultation bilaterally Abdomen: soft; nondistended; nontender; bowel sounds present Extremities: No deformity; full range of motion; pulses normal Neurologic: Awake, alert and oriented; motor function intact in all extremities and symmetric; no facial droop Skin: Warm and dry Psychiatric: Normal mood and affect   RESULTS  Summary of this visit's results, reviewed and interpreted by myself:   EKG Interpretation  Date/Time:    Ventricular Rate:    PR Interval:    QRS Duration:   QT Interval:    QTC Calculation:   R Axis:     Text Interpretation:         Laboratory Studies: Results for orders placed or performed during the hospital encounter of 10/20/20 (from the past 24 hour(s))  Basic metabolic panel     Status: Abnormal   Collection Time: 10/19/20  8:29 PM  Result Value Ref Range   Sodium 141 135 - 145 mmol/L    Potassium 2.8 (L) 3.5 - 5.1 mmol/L   Chloride 97 (L) 98 - 111 mmol/L   CO2 32 22 - 32 mmol/L  Glucose, Bld 110 (H) 70 - 99 mg/dL   BUN 9 8 - 23 mg/dL   Creatinine, Ser 7.42 0.44 - 1.00 mg/dL   Calcium 8.1 (L) 8.9 - 10.3 mg/dL   GFR, Estimated >59 >56 mL/min   Anion gap 12 5 - 15  CBC     Status: Abnormal   Collection Time: 10/19/20  8:29 PM  Result Value Ref Range   WBC 17.0 (H) 4.0 - 10.5 K/uL   RBC 3.48 (L) 3.87 - 5.11 MIL/uL   Hemoglobin 9.8 (L) 12.0 - 15.0 g/dL   HCT 38.7 (L) 56.4 - 33.2 %   MCV 86.5 80.0 - 100.0 fL   MCH 28.2 26.0 - 34.0 pg   MCHC 32.6 30.0 - 36.0 g/dL   RDW 95.1 88.4 - 16.6 %   Platelets 354 150 - 400 K/uL   nRBC 0.0 0.0 - 0.2 %  CBG monitoring, ED     Status: Abnormal   Collection Time: 10/19/20  9:03 PM  Result Value Ref Range   Glucose-Capillary 115 (H) 70 - 99 mg/dL  Urinalysis, Routine w reflex microscopic Urine, Clean Catch     Status: Abnormal   Collection Time: 10/19/20  9:09 PM  Result Value Ref Range   Color, Urine YELLOW YELLOW   APPearance CLEAR CLEAR   Specific Gravity, Urine 1.007 1.005 - 1.030   pH 6.5 5.0 - 8.0   Glucose, UA NEGATIVE NEGATIVE mg/dL   Hgb urine dipstick NEGATIVE NEGATIVE   Bilirubin Urine NEGATIVE NEGATIVE   Ketones, ur NEGATIVE NEGATIVE mg/dL   Protein, ur TRACE (A) NEGATIVE mg/dL   Nitrite NEGATIVE NEGATIVE   Leukocytes,Ua SMALL (A) NEGATIVE   RBC / HPF 0-5 0 - 5 RBC/hpf   WBC, UA 6-10 0 - 5 WBC/hpf   Bacteria, UA MANY (A) NONE SEEN   Squamous Epithelial / LPF 0-5 0 - 5   Mucus PRESENT   Resp Panel by RT-PCR (Flu A&B, Covid) Nasopharyngeal Swab     Status: None   Collection Time: 10/20/20  2:11 AM   Specimen: Nasopharyngeal Swab; Nasopharyngeal(NP) swabs in vial transport medium  Result Value Ref Range   SARS Coronavirus 2 by RT PCR NEGATIVE NEGATIVE   Influenza A by PCR NEGATIVE NEGATIVE   Influenza B by PCR NEGATIVE NEGATIVE   Imaging Studies: No results found.  ED COURSE and MDM  Nursing notes,  initial and subsequent vitals signs, including pulse oximetry, reviewed and interpreted by myself.  Vitals:   10/20/20 0035 10/20/20 0145 10/20/20 0230 10/20/20 0330  BP: (!) 151/79 (!) 143/78 (!) 165/76 (!) 155/77  Pulse: 79 79 78 77  Resp: Temp:      TempSrc:      SpO2: 97% 96% 96% 94%  Weight:      Height:       Medications  potassium chloride SA (KLOR-CON) CR tablet 40 mEq (40 mEq Oral Given 10/20/20 0145)   3:41 AM The source of the patient's fever is unclear.  She is negative for COVID and influenza.  Her urinalysis is suspicious but she states that with her self-catheterization it is usually somewhat contaminated.  She does not want to be placed on antibiotics unless the culture shows an actual infection and I agree with this plan.  She has no respiratory symptoms to suggest pneumonia.   PROCEDURES  Procedures   ED DIAGNOSES     ICD-10-CM   1. Febrile illness  R50.9  2. Nausea  R11.0          Briaunna Grindstaff, Jonny Ruiz, MD 10/20/20 [phone]

## 2020-10-20 NOTE — ED Notes (Signed)
Called [employer] to take patient home--voucher approved

## 2020-10-22 LAB — URINE CULTURE: Culture: 70000 — AB

## 2020-10-23 ENCOUNTER — Telehealth: Payer: Self-pay | Admitting: Emergency Medicine

## 2020-10-23 NOTE — Telephone Encounter (Signed)
Post ED Visit - Positive Culture Follow-up  Culture report reviewed by antimicrobial stewardship pharmacist: [facility] []  , Pharm.D. []  Enzo Bi, Pharm.D., BCPS AQ-ID []  , Pharm.D., BCPS []  Celedonio Miyamoto, Pharm.D., BCPS []  [place], Garvin Fila.D., BCPS, AAHIVP []  , Pharm.D., BCPS, AAHIVP []  Georgina Pillion, PharmD, BCPS []  , PharmD, BCPS []  [place], PharmD, BCPS [x]  [address], PharmD []  , PharmD, BCPS []  Estella Husk, PharmD  Pharmacy Team []  Lysle Pearl, PharmD []  , PharmD []  Phillips Climes, PharmD []  , Rph []  Agapito Games) , PharmD []  Daylene Posey, PharmD []  , PharmD []  Mervyn Gay, PharmD []  , PharmD []  Vinnie Level, PharmD []  [facility], PharmD []  , PharmD []  Len Childs, PharmD   Positive urine culture No further patient follow-up is required at this time.  Maurilio Puryear 10/23/2020, 2:36 PM

## 2020-10-24 ENCOUNTER — Telehealth (HOSPITAL_BASED_OUTPATIENT_CLINIC_OR_DEPARTMENT_OTHER): Payer: Self-pay | Admitting: Emergency Medicine

## 2020-10-24 ENCOUNTER — Telehealth: Payer: Self-pay | Admitting: Internal Medicine

## 2020-10-24 ENCOUNTER — Ambulatory Visit: Payer: Medicare Other | Admitting: Internal Medicine

## 2020-10-24 NOTE — Telephone Encounter (Signed)
Called pt back in regards to possible medication side effects.  She reports that when she was released from the hospital on 09/27/20 she was started on a few new medications.  She reports that she is very sensitive to medications.  She feels nauseous most of the time, weak, has chills and has ran a temperature since starting these meds.  She also has recurrent UTI's.  She has had negative Covid test tested d/t increased temp and fatigue.  She also reports she has severe GERD and since starting these meds it has progressed.  To the point where she is bent over in pain.  When asked why she did not call earlier she reports that she wanted to give medications time to work before she gave up on them.  She has 18 Allergies listed.  Will route to pharm D to review.

## 2020-10-24 NOTE — ED Notes (Signed)
Pt called requesting her urine culture be faxed to urologist at [facility]. Pt made aware they can see her results through Chart Everywhere.

## 2020-10-24 NOTE — Telephone Encounter (Signed)
Pt c/o medication issue:  1. Name of Medication:  Atorvastatin clopidogrel (PLAVIX) 75 MG tablet doxazosin (CARDURA) 1 MG tablet hydrALAZINE (APRESOLINE) 25 MG tablet Hydrocodone 7.5/325 NIFEdipine (ADALAT CC) 30 MG 24 hr tablet ranolazine (RANEXA) 500 MG 12 hr tablet spironolactone (ALDACTONE) 25 MG tablet sucralfate (CARAFATE) 1 G tablet    2. How are you currently taking this medication (dosage and times per day)? Listed above  3. Are you having a reaction (difficulty breathing--STAT)? Nausea , tongue thickening , loss of appetite, abdominal pain  4. What is your medication issue? Medicine is making her sick

## 2020-10-25 ENCOUNTER — Ambulatory Visit: Payer: Medicare Other | Admitting: Internal Medicine

## 2020-10-25 ENCOUNTER — Other Ambulatory Visit: Payer: Self-pay

## 2020-10-25 ENCOUNTER — Telehealth: Payer: Medicare Other | Admitting: Internal Medicine

## 2020-10-25 NOTE — Progress Notes (Signed)
Dr. [name] gave verbal to refill these meds.  Give enough to last until next visit. 30 day supplies sent in. Called pt and informed her that Dr. [name] would like her to hold nifedipine and promethazine until she is seen by him on Tuesday.  She verbalized understanding.  She also informed me that doesn't need a refill on isosorbide.

## 2020-10-25 NOTE — Progress Notes (Deleted)
Cardiology Office Note:    Date:  10/25/2020   ID:  Jennifer [name], DOB March 04, 1946, MRN [id number]  PCP:  [facility]   [facility] Providers Cardiologist:  Christell Constant, MD     Referring MD: [facility]   CC:  Follow up  History of Present Illness:    Jennifer [name] is a 75 y.o. female with a hx of hypertension, PVC, HLD, chronic pain syndrome, who presents as an urgent visit for HTN emergency (found in echo lab).  In interim of this visit, patient sent to ED, had CCTA, found to have obstructive disease with LAD disease un-amenable to PCI (concerned for dissection; reviewed by Drs. Sheppard Plumber).  Discharged on ranolazine, isordil, losartan, atorvastatin, aldactone, and clopidogrel. Nebivolol DC due to sinus pause.  Had planned allergy referral.  Seen 09/25/20.    Past Medical History:  Diagnosis Date   Acute meniscal tear of left knee    FOLLOWED BY DR GIAFFREY   BPPV (benign paroxysmal positional vertigo)    Chronic bladder pain    Chronic fatigue syndrome    Chronic low back pain    W/ RIGHT LEG PAIN AND NUMBNESS   Cystitis, chronic    Fibromyalgia    GERD (gastroesophageal reflux disease)    Hiatal hernia    Hypertension    IC (interstitial cystitis)    OSA (obstructive sleep apnea)    per pt study yrs ago-- moderate osa ,  intolerant cpap   Pinched vertebral nerve    bilateral L2 -- L3 and L3 -- L4-/  epidural injection's treatment , PT and pain clince   PONV (postoperative nausea and vomiting)    severe   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    Self-catheterizes urinary bladder    Spinal stenosis, lumbar region with neurogenic claudication    Wears glasses    Wears partial dentures    upper and lower    Past Surgical History:  Procedure Laterality Date   ABDOMINAL AORTOGRAM N/A 09/16/2020   Procedure: ABDOMINAL AORTOGRAM;  Surgeon: Iran Ouch, MD;  Location: [facility]  INVASIVE CV LAB;  Service: Cardiovascular;  Laterality: N/A;   ABDOMINAL HYSTERECTOMY  1980   w/ Left Salpingoophorectomy   CARDIAC CATHETERIZATION  08-21-2001  dr Verdis Prime   normal coronary arteries and LVF   CARPAL TUNNEL RELEASE Bilateral 1986   CECALCYSTOPLASTY/  APPENDECTOMY  1984   at [facility]   CHOLECYSTECTOMY  1992   CYSTO WITH HYDRODISTENSION N/A 01/12/2016   Procedure: CYSTOSCOPY/HYDRODISTENSION;  Surgeon: Jethro Bolus, MD;  Location: [facility];  Service: Urology;  Laterality: N/A;   CYSTO WITH HYDRODISTENSION N/A 08/30/2016   Procedure: CYSTOSCOPY/HYDRODISTENSION OF BLADDER INJECTION OF MARCAINE/PYRIDIUM;  Surgeon: Jethro Bolus, MD;  Location: [facility];  Service: Urology;  Laterality: N/A;   LEFT HEART CATH AND CORONARY ANGIOGRAPHY N/A 09/16/2020   Procedure: LEFT HEART CATH AND CORONARY ANGIOGRAPHY;  Surgeon: Iran Ouch, MD;  Location: [facility] INVASIVE CV LAB;  Service: Cardiovascular;  Laterality: N/A;   MULTIPLE CYSTO/  HYDRODISTENTION/  INSTILLATION THERAPY  last one 08-19-2009   NASAL SINUS SURGERY  1987   TONSILLECTOMY  as child   VAGINAL GROWTH REMOVED  as teen    Current Medications: No outpatient medications have been marked as taking for the 10/25/20 encounter (Appointment) with Christell Constant, MD.     Allergies:   Moxifloxacin,  Quinolones, Sulfa antibiotics, Furosemide, Aspirin, Duloxetine hcl, Hydrochlorothiazide, Ibuprofen, Other, Robaxin [methocarbamol], Topiramate, Zofran [ondansetron hcl], Codeine, Dimethyl sulfoxide, Macrodantin [nitrofurantoin macrocrystal], Nitrofurantoin, Tape, and Yellow dye   Social History   Socioeconomic History   Marital status: Single    Spouse name: Not on file   Number of children: 0   Years of education: College   Highest education level: Not on file  Occupational History   Occupation: n/a  Tobacco Use   Smoking status: Never   Smokeless tobacco: Never   Vaping Use   Vaping Use: Never used  Substance and Sexual Activity   Alcohol use: No    Alcohol/week: 0.0 standard drinks   Drug use: No   Sexual activity: Not on file  Other Topics Concern   Not on file  Social History Narrative   Lives alone   Caffeine use: 2 glasses tea/day   Social Determinants of Health   Financial Resource Strain: Not on file  Food Insecurity: Not on file  Transportation Needs: Not on file  Physical Activity: Not on file  Stress: Not on file  Social Connections: Not on file     Family History: The patient's family history includes Alzheimer's disease in her father; Asthma in her father; CAD in her father; COPD in her father; Cancer in her brother; Colonic polyp in her mother; Emphysema in her father; Heart failure in her mother; Hypertension in her father and mother; Peripheral vascular disease in her mother; Stroke in her mother.  ROS:   Please see the history of present illness.     All other systems reviewed and are negative.  EKGs/Labs/Other Studies Reviewed:    The following studies were reviewed today:  EKG:    Cardiac Event Monitoring***: Date: Results:  Transthoracic Echocardiogram: Date: 09/15/20 Results:  1. Left ventricular ejection fraction, by estimation, is 60 to 65%. The  left ventricle has normal function. The left ventricle has no regional  wall motion abnormalities. There is moderate left ventricular hypertrophy.  Left ventricular diastolic  parameters are consistent with Grade I diastolic dysfunction (impaired  relaxation). Elevated left atrial pressure. The average left ventricular  global longitudinal strain is -20.4 %. The global longitudinal strain is  normal.   2. Right ventricular systolic function is normal. The right ventricular  size is normal.   3. The mitral valve is normal in structure. Trivial mitral valve  regurgitation. No evidence of mitral stenosis.   4. The aortic valve is tricuspid. Aortic valve  regurgitation is not  visualized. Mild aortic valve stenosis.   5. Aortic dilatation noted. There is mild dilatation of the ascending  aorta, measuring 43 mm.   6. The inferior vena cava is normal in size with greater than 50%  respiratory variability, suggesting right atrial pressure of 3 mmHg.   Comparison(s): 07/11/17 EF 60-65%. PA pressure .    Cardiac CT: Date: Results:  IMPRESSION: 1. Coronary calcium score of 157. This was 74th percentile for age, sex, and race matched control.   2. Normal coronary origin with right dominance.   3. CAD-RADS 3. Moderate stenosis. Consider symptom-guided anti-ischemic pharmacotherapy as well as risk factor modification per guideline directed care. Additional analysis with CT FFR will be submitted.   4. Ascending aorta is mildly dilated for age and body surface area: 40 mm. Aortic atherosclerosis noted.   5. Mild dilation of the pulmonary artery.   6. Cannot exclude small patent foramen ovale.   7. Small hiatal hernia; hepatic steatosis   Left/Right  Heart Catheterizations: Date: 09/16/20 Results: Mid LAD lesion is 50% stenosed. 3rd Diag lesion is 60% stenosed. Dist LAD-1 lesion is 90% stenosed. Dist LAD-2 lesion is 40% stenosed.   1.  Significant one-vessel coronary artery disease involving the mid to distal LAD at the bifurcation of the third diagonal which has also 60% ostial stenosis.  The coronary arteries are very tortuous suggestive of hypertensive heart disease. 2.  Left ventricular angiography was not performed.  EF was normal by echo. 3.  Severely elevated blood pressure with mildly elevated left ventricular end-diastolic pressure. 4.  Transient left bundle branch block after engaging the left ventricle.  This resolved by the end of the case. 5.  Abdominal aortogram was performed and showed no evidence of renal artery stenosis.   Recommendations: Unfortunately, the LAD stenosis is in a very tortuous segment and at  the origin of the third diagonal branch which also has ostial disease.  PCI in this area will be high risk for dissection as well as sidebranch closure especially in the setting of uncontrolled hypertension.  In addition, the distal LAD after the stenosis is diffusely diseased. I recommend maximizing medical therapy and controlling blood pressure before considering PCI.  I added amlodipine to her antihypertensive medications. Other complicating issue is that the patient cannot tolerate aspirin long-term.  If we decide to proceed with PCI at some point, we will have to make sure she tolerates ticagrelor which can likely be used as monotherapy.   Recent Labs: 09/16/2020: TSH 3.053 09/19/2020: Magnesium 2.1 09/25/2020: ALT 22; B Natriuretic Peptide 106.8 10/19/2020: BUN 9; Creatinine, Ser 0.60; Hemoglobin 9.8; Platelets 354; Potassium 2.8; Sodium 141  Recent Lipid Panel No results found for: CHOL, TRIG, HDL, CHOLHDL, VLDL, LDLCALC, LDLDIRECT   Risk Assessment/Calculations:     N/A  Physical Exam:    There were no vitals filed for this visit. There is no height or weight on file to calculate BMI.   Wt Readings from Last 3 Encounters:  10/19/20 194 lb 8.9 oz (88.3 kg)  10/03/20 200 lb (90.7 kg)  09/27/20 200 lb 1.6 oz (90.8 kg)     GEN:  Well nourished, well developed in no acute distress HEENT: Bilateral Frank's Sign NECK: No JVD LYMPHATICS: No lymphadenopathy CARDIAC: Regular bradycardia rate 57 II/VI Systolic Crescendo RESPIRATORY:  Clear to auscultation without rales, wheezing or rhonchi  ABDOMEN: Soft, non-tender, non-distended MUSCULOSKELETAL:  No edema; No deformity  SKIN: Warm and dry NEUROLOGIC:  Alert and oriented x 3 PSYCHIATRIC:  Normal affect   ASSESSMENT:    No diagnosis found.  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  Concerns regarding medicines are outlined above.  No orders of the defined types were placed in this encounter.  No orders of the defined types were placed in this encounter.   There are no Patient Instructions on file for this visit.   Signed, Christell Constant,  MD  10/25/2020 10:47 AM    [facility]

## 2020-10-25 NOTE — Telephone Encounter (Signed)
Patient seen in ER on 7/21 for fever.  Has it not resolved?  Also self catheterizes herself so frequently has UTI symptoms.  Unsure if these issues are medication related. I see that she has a video visit this afternoon

## 2020-10-25 NOTE — Telephone Encounter (Signed)
Pt has a MyChart appt today at 2:40pm but she is wanting to speak with [facility] and the Dr. Before her video visit starts. Please advise pt further

## 2020-10-26 MED ORDER — SPIRONOLACTONE 25 MG PO TABS: 25.0000 mg | ORAL_TABLET | Freq: Every day | ORAL | 0 refills | Status: DC

## 2020-10-26 MED ORDER — CLOPIDOGREL BISULFATE 75 MG PO TABS: 75.0000 mg | ORAL_TABLET | Freq: Every day | ORAL | 0 refills | Status: DC

## 2020-10-26 MED ORDER — ATORVASTATIN CALCIUM 40 MG PO TABS: 40.0000 mg | ORAL_TABLET | Freq: Every day | ORAL | 0 refills | Status: DC

## 2020-10-26 MED ORDER — DOXAZOSIN MESYLATE 1 MG PO TABS: 1.0000 mg | ORAL_TABLET | Freq: Every day | ORAL | 0 refills | Status: DC

## 2020-10-26 MED ORDER — RANOLAZINE ER 500 MG PO TB12: 500.0000 mg | ORAL_TABLET | Freq: Two times a day (BID) | ORAL | 0 refills | Status: DC

## 2020-10-26 NOTE — Telephone Encounter (Signed)
Most drug drug interactions noted include interactions involving fluconazole and ondansetron, which does not look like patient is currently taking.    Other interactions:  Concurrent use of CLOPIDOGREL and NIFEDIPINE may result in decreased antiplatelet effect and increased risk of thrombotic events.  Promethazine was ordered on 10/20/20 which has interactions.  Concurrent use of MECLIZINE and Promethazine may result in an increase in CNS or respiratory depression.  Concurrent use of PROMETHAZINE and RANOLAZINE may result in an increased risk of QT interval prolongation.

## 2020-10-31 NOTE — Progress Notes (Deleted)
Cardiology Office Note:    Date:  10/31/2020   ID:  Jennifer ReamerBarbara E Nardo, DOB 07-10-1945, MRN [id number]  PC[facility]:  [facility]   [facility] Providers Cardiologist:  Christell ConstantMahesh A Misao Fackrell, MD     Referring MD: [facility]   CC:  Follow up  History of Present Illness:    Jennifer ReamerBarbara E Gatchell is a 75 y.o. female with a hx of hypertension, PVC, HLD, chronic pain syndrome, who presents as an urgent visit for HTN emergency (found in echo lab).  In interim of this visit, patient sent to ED, had CCTA, found to have obstructive disease with LAD disease un-amenable to PCI (concerned for dissection; reviewed by Drs. Sheppard PlumberArida and Smith).  Discharged on ranolazine, isordil, losartan, atorvastatin, aldactone, and clopidogrel. Nebivolol DC due to sinus pause.  Had planned allergy referral.  Seen 09/25/20.    Past Medical History:  Diagnosis Date   Acute meniscal tear of left knee    FOLLOWED BY DR GIAFFREY   BPPV (benign paroxysmal positional vertigo)    Chronic bladder pain    Chronic fatigue syndrome    Chronic low back pain    W/ RIGHT LEG PAIN AND NUMBNESS   Cystitis, chronic    Fibromyalgia    GERD (gastroesophageal reflux disease)    Hiatal hernia    Hypertension    IC (interstitial cystitis)    OSA (obstructive sleep apnea)    per pt study yrs ago-- moderate osa ,  intolerant cpap   Pinched vertebral nerve    bilateral L2 -- L3 and L3 -- L4-/  epidural injection's treatment , PT and pain clince   PONV (postoperative nausea and vomiting)    severe   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    Self-catheterizes urinary bladder    Spinal stenosis, lumbar region with neurogenic claudication    Wears glasses    Wears partial dentures    upper and lower    Past Surgical History:  Procedure Laterality Date   ABDOMINAL AORTOGRAM N/A 09/16/2020   Procedure: ABDOMINAL AORTOGRAM;  Surgeon: Iran OuchArida, Muhammad A, MD;  Location: [facility]  INVASIVE CV LAB;  Service: Cardiovascular;  Laterality: N/A;   ABDOMINAL HYSTERECTOMY  1980   w/ Left Salpingoophorectomy   CARDIAC CATHETERIZATION  08-21-2001  dr Verdis Primehenry smith   normal coronary arteries and LVF   CARPAL TUNNEL RELEASE Bilateral 1986   CECALCYSTOPLASTY/  [facility]CT[facility]  [facility]   [facility]   CHOLECYSTECTOMY  1992   CYSTO WITH HYDRODISTENSION N/A 01/12/2016   Procedure: CYSTOSCOPY/HYDRODISTENSION;  Surgeon: Jethro BolusSigmund Tannenbaum, MD;  [facility];  Service: Urology;  Laterality: N/A;   CYSTO WITH HYDRODISTENSION N/A 08/30/2016   Procedure: CYSTOSCOPY/HYDRODISTENSION OF BLADDER INJECTION OF MARCAINE/PYRIDIUM;  Surgeon: Jethro Bolusannenbaum, Sigmund, MD;  [facility];  Service: Urology;  Laterality: N/A;   LEFT HEART CATH AND CORONARY ANGIOGRAPHY N/A 09/16/2020   Procedure: LEFT HEART CATH AND CORONARY ANGIOGRAPHY;  Surgeon: Iran OuchArida, Muhammad A, MD;  Location: [facility] INVASIVE CV LAB;  Service: Cardiovascular;  Laterality: N/A;   MULTIPLE CYSTO/  HYDRODISTENTION/  INSTILLATION THERAPY  last one 08-19-2009   NASAL SINUS SURGERY  1987   TONSILLECTOMY  as child   VAGINAL GROWTH REMOVED  as teen    Current Medications: No outpatient medications have been marked as taking for the 11/01/20 encounter (Appointment) with Christell Constanthandrasekhar, Virgie Chery A, MD.     Allergies:   Moxifloxacin,  Quinolones, Sulfa antibiotics, Furosemide, Aspirin, Duloxetine hcl, Hydrochlorothiazide, Ibuprofen, Other, Robaxin [methocarbamol], Topiramate, Zofran [ondansetron hcl], Codeine, Dimethyl sulfoxide, Macrodantin [nitrofurantoin macrocrystal], Nitrofurantoin, Tape, and Yellow dye   Social History   Socioeconomic History   Marital status: Single    Spouse name: Not on file   Number of children: 0   Years of education: College   Highest education level: Not on file  Occupational History   Occupation: n/a  Tobacco Use   Smoking status: Never   Smokeless tobacco: Never   Vaping Use   Vaping Use: Never used  Substance and Sexual Activity   Alcohol use: No    Alcohol/week: 0.0 standard drinks   Drug use: No   Sexual activity: Not on file  Other Topics Concern   Not on file  Social History Narrative   Lives alone   Caffeine use: 2 glasses tea/day   Social Determinants of Health   Financial Resource Strain: Not on file  Food Insecurity: Not on file  Transportation Needs: Not on file  Physical Activity: Not on file  Stress: Not on file  Social Connections: Not on file     Family History: The patient's family history includes Alzheimer's disease in her father; Asthma in her father; CAD in her father; COPD in her father; Cancer in her brother; Colonic polyp in her mother; Emphysema in her father; Heart failure in her mother; Hypertension in her father and mother; Peripheral vascular disease in her mother; Stroke in her mother.  ROS:   Please see the history of present illness.     All other systems reviewed and are negative.  EKGs/Labs/Other Studies Reviewed:    The following studies were reviewed today:  EKG:    Cardiac Event Monitoring***: Date: Results:  Transthoracic Echocardiogram: Date: 09/15/20 Results:  1. Left ventricular ejection fraction, by estimation, is 60 to 65%. The  left ventricle has normal function. The left ventricle has no regional  wall motion abnormalities. There is moderate left ventricular hypertrophy.  Left ventricular diastolic  parameters are consistent with Grade I diastolic dysfunction (impaired  relaxation). Elevated left atrial pressure. The average left ventricular  global longitudinal strain is -20.4 %. The global longitudinal strain is  normal.   2. Right ventricular systolic function is normal. The right ventricular  size is normal.   3. The mitral valve is normal in structure. Trivial mitral valve  regurgitation. No evidence of mitral stenosis.   4. The aortic valve is tricuspid. Aortic valve  regurgitation is not  visualized. Mild aortic valve stenosis.   5. Aortic dilatation noted. There is mild dilatation of the ascending  aorta, measuring 43 mm.   6. The inferior vena cava is normal in size with greater than 50%  respiratory variability, suggesting right atrial pressure of 3 mmHg.   Comparison(s): 07/11/17 EF 60-65%. PA pressure .    Cardiac CT: Date: Results:  IMPRESSION: 1. Coronary calcium score of 157. This was 74th percentile for age, sex, and race matched control.   2. Normal coronary origin with right dominance.   3. CAD-RADS 3. Moderate stenosis. Consider symptom-guided anti-ischemic pharmacotherapy as well as risk factor modification per guideline directed care. Additional analysis with CT FFR will be submitted.   4. Ascending aorta is mildly dilated for age and body surface area: 40 mm. Aortic atherosclerosis noted.   5. Mild dilation of the pulmonary artery.   6. Cannot exclude small patent foramen ovale.   7. Small hiatal hernia; hepatic steatosis   Left/Right  Heart Catheterizations: Date: 09/16/20 Results: Mid LAD lesion is 50% stenosed. 3rd Diag lesion is 60% stenosed. Dist LAD-1 lesion is 90% stenosed. Dist LAD-2 lesion is 40% stenosed.   1.  Significant one-vessel coronary artery disease involving the mid to distal LAD at the bifurcation of the third diagonal which has also 60% ostial stenosis.  The coronary arteries are very tortuous suggestive of hypertensive heart disease. 2.  Left ventricular angiography was not performed.  EF was normal by echo. 3.  Severely elevated blood pressure with mildly elevated left ventricular end-diastolic pressure. 4.  Transient left bundle branch block after engaging the left ventricle.  This resolved by the end of the case. 5.  Abdominal aortogram was performed and showed no evidence of renal artery stenosis.   Recommendations: Unfortunately, the LAD stenosis is in a very tortuous segment and at  the origin of the third diagonal branch which also has ostial disease.  PCI in this area will be high risk for dissection as well as sidebranch closure especially in the setting of uncontrolled hypertension.  In addition, the distal LAD after the stenosis is diffusely diseased. I recommend maximizing medical therapy and controlling blood pressure before considering PCI.  I added amlodipine to her antihypertensive medications. Other complicating issue is that the patient cannot tolerate aspirin long-term.  If we decide to proceed with PCI at some point, we will have to make sure she tolerates ticagrelor which can likely be used as monotherapy.   Recent Labs: 09/16/2020: TSH 3.053 09/19/2020: Magnesium 2.1 09/25/2020: ALT 22; B Natriuretic Peptide 106.8 10/19/2020: BUN 9; Creatinine, Ser 0.60; Hemoglobin 9.8; Platelets 354; Potassium 2.8; Sodium 141  Recent Lipid Panel No results found for: CHOL, TRIG, HDL, CHOLHDL, VLDL, LDLCALC, LDLDIRECT   Risk Assessment/Calculations:     N/A  Physical Exam:    There were no vitals filed for this visit. There is no height or weight on file to calculate BMI.   Wt Readings from Last 3 Encounters:  10/19/20 194 lb 8.9 oz (88.3 kg)  10/03/20 200 lb (90.7 kg)  09/27/20 200 lb 1.6 oz (90.8 kg)     GEN:  Well nourished, well developed in no acute distress HEENT: Bilateral Frank's Sign NECK: No JVD LYMPHATICS: No lymphadenopathy CARDIAC: Regular bradycardia rate 57 II/VI Systolic Crescendo RESPIRATORY:  Clear to auscultation without rales, wheezing or rhonchi  ABDOMEN: Soft, non-tender, non-distended MUSCULOSKELETAL:  No edema; No deformity  SKIN: Warm and dry NEUROLOGIC:  Alert and oriented x 3 PSYCHIATRIC:  Normal affect   ASSESSMENT:    No diagnosis found.  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  Concerns regarding medicines are outlined above.  No orders of the defined types were placed in this encounter.  No orders of the defined types were placed in this encounter.   There are no Patient Instructions on file for this visit.   Signed, Christell Constant,  MD  10/31/2020 4:24 PM    [facility]

## 2020-11-01 ENCOUNTER — Ambulatory Visit: Payer: Medicare Other | Admitting: Internal Medicine

## 2020-11-01 ENCOUNTER — Telehealth: Payer: Self-pay | Admitting: Internal Medicine

## 2020-11-01 MED ORDER — HYDRALAZINE HCL 50 MG PO TABS
75.0000 mg | ORAL_TABLET | Freq: Three times a day (TID) | ORAL | 3 refills | Status: DC
Start: 2020-11-01 — End: 2020-12-09

## 2020-11-01 NOTE — Telephone Encounter (Signed)
Spoke with pt switched appointment for today to Thursday per pt request.  Pt also requesting refill of hydralazine.  Dr. [name] is ok with refill for 1 year.  Pt currently taking hydralazine 25 mg 3 by mouth Q8 hours.  She is okay with me sending in 50mg  tablets take 1.5 Q8 hours.  Patient verbalizes understanding that she will now take hydralazine 1.5 pills PO TID. Orders placed.

## 2020-11-01 NOTE — Telephone Encounter (Signed)
     Pt requesting to speak with [facility], she said she needs to talk to her about her appt and medications

## 2020-11-03 ENCOUNTER — Encounter: Payer: Self-pay | Admitting: Internal Medicine

## 2020-11-03 ENCOUNTER — Other Ambulatory Visit: Payer: Self-pay

## 2020-11-03 ENCOUNTER — Ambulatory Visit (INDEPENDENT_AMBULATORY_CARE_PROVIDER_SITE_OTHER): Payer: Medicare Other | Admitting: Internal Medicine

## 2020-11-03 VITALS — BP 130/73 | HR 88 | Ht 64.5 in | Wt 191.0 lb

## 2020-11-03 DIAGNOSIS — I10 Essential (primary) hypertension: Secondary | ICD-10-CM

## 2020-11-03 DIAGNOSIS — Z889 Allergy status to unspecified drugs, medicaments and biological substances status: Secondary | ICD-10-CM

## 2020-11-03 MED ORDER — ASPIRIN EC 81 MG PO TBEC: 81.0000 mg | DELAYED_RELEASE_TABLET | Freq: Every day | ORAL | 3 refills | Status: DC

## 2020-11-03 MED ORDER — DOXAZOSIN MESYLATE 1 MG PO TABS: 1.0000 mg | ORAL_TABLET | Freq: Every day | ORAL | 3 refills | Status: DC

## 2020-11-03 MED ORDER — ATORVASTATIN CALCIUM 40 MG PO TABS: 40.0000 mg | ORAL_TABLET | Freq: Every day | ORAL | 3 refills | Status: DC

## 2020-11-03 MED ORDER — RANOLAZINE ER 500 MG PO TB12: 500.0000 mg | ORAL_TABLET | Freq: Two times a day (BID) | ORAL | 3 refills | Status: DC

## 2020-11-03 MED ORDER — ISOSORBIDE DINITRATE 10 MG PO TABS: 10.0000 mg | ORAL_TABLET | Freq: Three times a day (TID) | ORAL | 0 refills | Status: DC

## 2020-11-03 MED ORDER — SPIRONOLACTONE 25 MG PO TABS: 25.0000 mg | ORAL_TABLET | Freq: Every day | ORAL | 3 refills | Status: DC

## 2020-11-03 NOTE — Patient Instructions (Signed)
Medication Instructions:  Your physician has recommended you make the following change in your medication:  STOP: clopidogrel (Plavix)  START:  Aspirin 81 mg by mouth daily  *If you need a refill on your cardiac medications before your next appointment, please call your pharmacy*   Lab Work: NONE If you have labs (blood work) drawn today and your tests are completely normal, you will receive your results only by: MyChart Message (if you have MyChart) OR A paper copy in the mail If you have any lab test that is abnormal or we need to change your treatment, we will call you to review the results.   Testing/Procedures: Your physician has referred you to our Pharmacy clinic to help manage Hypertension.  Your physician has referred you to see an Allergy Specialist    Follow-Up: At [facility], you and your health needs are our priority.  As part of our continuing mission to provide you with exceptional heart care, we have created designated Provider Care Teams.  These Care Teams include your primary Cardiologist (physician) and Advanced Practice Providers (APPs -  Physician Assistants and Nurse Practitioners) who all work together to provide you with the care you need, when you need it.  We recommend signing up for the patient portal called "MyChart".  Sign up information is provided on this After Visit Summary.  MyChart is used to connect with patients for Virtual Visits (Telemedicine).  Patients are able to view lab/test results, encounter notes, upcoming appointments, etc.  Non-urgent messages can be sent to your provider as well.   To learn more about what you can do with MyChart, go to [email or web address].    Your next appointment:   4 month(s)  The format for your next appointment:   In Person  Provider:   You may see Christell Constant, MD or one of the following Advanced Practice Providers on your designated Care Team:   Ronie Spies, PA-C Jacolyn Reedy, PA-C

## 2020-11-03 NOTE — Progress Notes (Signed)
Cardiology Office Note:    Date:  11/03/2020   ID:  LAQUETA BONAVENTURA, DOB [birth year], MRN [id number]  PCP:  [facility]   [facility] Providers Cardiologist:  Christell Constant, MD     Referring MD: [facility]   CC:  Follow up  History of Present Illness:    Jennifer [name] is a 75 y.o. female with a hx of hypertension, PVC, HLD, chronic pain syndrome, who presents as an urgent visit for HTN emergency (found in echo lab).  In interim of this visit, patient sent to ED, had CCTA, found to have obstructive disease with LAD disease un-amenable to PCI (concerned for dissection; reviewed by Drs. Sheppard Plumber).  Discharged on ranolazine, isordil, losartan, atorvastatin, aldactone, and clopidogrel. Nebivolol DC due to sinus pause.  Had planned allergy referral.  Seen 09/25/20.  Since last visit has had positive CCTA.  Then had LHC.  Discharged off ASA because of allergy.  Patient notes that she is doing terribly.  She notes that the plavix has given her feet, dark stools, nausea, constipations, and palpitations.  Notes that she is not allergic to ASA.  Nifedipine was stopped because of concern for allergy. Promethazine was stopped for prolonged QTC concerns but she has been taken her Zofran since.  Patient notes that through all of this her burning chest feeling has improved.   Past Medical History:  Diagnosis Date   Acute meniscal tear of left knee    FOLLOWED BY DR GIAFFREY   BPPV (benign paroxysmal positional vertigo)    Chronic bladder pain    Chronic fatigue syndrome    Chronic low back pain    W/ RIGHT LEG PAIN AND NUMBNESS   Cystitis, chronic    Fibromyalgia    GERD (gastroesophageal reflux disease)    Hiatal hernia    Hypertension    IC (interstitial cystitis)    OSA (obstructive sleep apnea)    per pt study yrs ago-- moderate osa ,  intolerant cpap   Pinched vertebral nerve    bilateral L2 -- L3 and L3 -- L4-/  epidural  injection's treatment , PT and pain clince   PONV (postoperative nausea and vomiting)    severe   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    Self-catheterizes urinary bladder    Spinal stenosis, lumbar region with neurogenic claudication    Wears glasses    Wears partial dentures    upper and lower    Past Surgical History:  Procedure Laterality Date   ABDOMINAL AORTOGRAM N/A 09/16/2020   Procedure: ABDOMINAL AORTOGRAM;  Surgeon: Iran Ouch, MD;  Location: [facility];  Service: Cardiovascular;  Laterality: N/A;   ABDOMINAL HYSTERECTOMY  1980   w/ Left Salpingoophorectomy   CARDIAC CATHETERIZATION  08-21-2001  dr Verdis Prime   normal coronary arteries and LVF   CARPAL TUNNEL RELEASE Bilateral 1986   CECALCYSTOPLASTY/  APPENDECTOMY  1984   at [facility]   CHOLECYSTECTOMY  1992   CYSTO WITH HYDRODISTENSION N/A 01/12/2016   Procedure: CYSTOSCOPY/HYDRODISTENSION;  Surgeon: Jethro Bolus, MD;  Location: [facility];  Service: Urology;  Laterality: N/A;   CYSTO WITH HYDRODISTENSION N/A 08/30/2016   Procedure: CYSTOSCOPY/HYDRODISTENSION OF BLADDER INJECTION OF MARCAINE/PYRIDIUM;  Surgeon: Jethro Bolus, MD;  Location: [facility];  Service: Urology;  Laterality: N/A;   LEFT HEART CATH AND CORONARY ANGIOGRAPHY N/A 09/16/2020   Procedure:  LEFT HEART CATH AND CORONARY ANGIOGRAPHY;  Surgeon: Iran OuchArida, Muhammad A, MD;  Location: [facility];  Service: Cardiovascular;  Laterality: N/A;   MULTIPLE CYSTO/  HYDRODISTENTION/  INSTILLATION THERAPY  last one 08-19-2009   NASAL SINUS SURGERY  1987   TONSILLECTOMY  as child   VAGINAL GROWTH REMOVED  as teen    Current Medications: Current Meds  Medication Sig   acetaminophen (TYLENOL) 500 MG tablet Take 500 mg by mouth 2 (two) times daily as needed (for pain).   aspirin EC 81 MG tablet Take 1 tablet (81 mg total) by mouth daily. Swallow whole.   D-Mannose 500  MG CAPS Take 500-1,500 mg by mouth 3 (three) times daily.   fexofenadine (ALLEGRA) 60 MG tablet Take 60 mg by mouth See admin instructions. Take 60 mg by mouth one to two times a day as needed for allergy symptoms   fluconazole (DIFLUCAN) 100 MG tablet Take 1 tablet (100 mg total) by mouth daily.   fosfomycin (MONUROL) 3 g PACK Take 3 g by mouth once.   hydrALAZINE (APRESOLINE) 50 MG tablet Take 1.5 tablets (75 mg total) by mouth 3 (three) times daily.   ipratropium (ATROVENT) 0.06 % nasal spray Place 2 sprays into both nostrils 2 (two) times daily as needed for rhinitis.   losartan (COZAAR) 100 MG tablet Take 100 mg by mouth daily.   meclizine (ANTIVERT) 25 MG tablet Take 25 mg by mouth 3 (three) times daily as needed for dizziness.   Meth-Hyo-M Bl-Benz Acd-Ph Sal 81.6 MG TABS Take 1 tablet (81.6 mg total) by mouth 2 (two) times daily as needed.   methenamine (HIPREX) 1 G tablet Take by mouth as directed. TAKES WHEN NOT TAKING ANTIBIOTICS   NIFEdipine (ADALAT CC) 30 MG 24 hr tablet Take 1 tablet (30 mg total) by mouth daily.   nitroGLYCERIN (NITROSTAT) 0.4 MG SL tablet Place 1 tablet (0.4 mg total) under the tongue every 5 (five) minutes as needed for chest pain.   nystatin cream (MYCOSTATIN) Apply topically 2 (two) times daily.   omeprazole (PRILOSEC) 20 MG capsule Take 20 mg by mouth See admin instructions. Take 20 mg by mouth in the morning one hour before breakfast and an additional 20 mg before evening meal as needed for GERD   ondansetron (ZOFRAN-ODT) 4 MG disintegrating tablet Take 4 mg by mouth every 8 (eight) hours as needed for nausea (dissolve orally).   promethazine (PHENERGAN) 25 MG tablet Take 1 tablet (25 mg total) by mouth every 6 (six) hours as needed for refractory nausea / vomiting.   sucralfate (CARAFATE) 1 G tablet Take 1 g by mouth See admin instructions. Take 1 gram by mouth in the morning and an additional 1 gram once a day for flares   triamcinolone cream (KENALOG) 0.1 %  Apply 1 application topically daily as needed (for eczema).   [DISCONTINUED] atorvastatin (LIPITOR) 40 MG tablet Take 1 tablet (40 mg total) by mouth daily.   [DISCONTINUED] clopidogrel (PLAVIX) 75 MG tablet Take 1 tablet (75 mg total) by mouth daily.   [DISCONTINUED] doxazosin (CARDURA) 1 MG tablet Take 1 tablet (1 mg total) by mouth daily.   [DISCONTINUED] isosorbide dinitrate (ISORDIL) 10 MG tablet Take 1 tablet (10 mg total) by mouth 3 (three) times daily.   [DISCONTINUED] ranolazine (RANEXA) 500 MG 12 hr tablet Take 1 tablet (500 mg total) by mouth 2 (two) times daily.   [DISCONTINUED] spironolactone (ALDACTONE) 25 MG tablet Take 1 tablet (25 mg total) by mouth  daily.     Allergies:   Moxifloxacin, Quinolones, Sulfa antibiotics, Furosemide, Aspirin, Duloxetine hcl, Hydrochlorothiazide, Ibuprofen, Other, Robaxin [methocarbamol], Topiramate, Zofran [ondansetron hcl], Codeine, Dimethyl sulfoxide, Macrodantin [nitrofurantoin macrocrystal], Nitrofurantoin, Tape, and Yellow dye   Social History   Socioeconomic History   Marital status: Single    Spouse name: Not on file   Number of children: 0   Years of education: College   Highest education level: Not on file  Occupational History   Occupation: n/a  Tobacco Use   Smoking status: Never   Smokeless tobacco: Never  Vaping Use   Vaping Use: Never used  Substance and Sexual Activity   Alcohol use: No    Alcohol/week: 0.0 standard drinks   Drug use: No   Sexual activity: Not on file  Other Topics Concern   Not on file  Social History Narrative   Lives alone   Caffeine use: 2 glasses tea/day   Social Determinants of Health   Financial Resource Strain: Not on file  Food Insecurity: Not on file  Transportation Needs: Not on file  Physical Activity: Not on file  Stress: Not on file  Social Connections: Not on file     Family History: The patient's family history includes Alzheimer's disease in her father; Asthma in her father;  CAD in her father; COPD in her father; Cancer in her brother; Colonic polyp in her mother; Emphysema in her father; Heart failure in her mother; Hypertension in her father and mother; Peripheral vascular disease in her mother; Stroke in her mother.  ROS:   Please see the history of present illness.     All other systems reviewed and are negative.  EKGs/Labs/Other Studies Reviewed:    The following studies were reviewed today:  EKG:   11/24/20:  Sinus rhythm 88 with occasional PACs Qtc 426  Transthoracic Echocardiogram: Date: 09/15/20 Results:  1. Left ventricular ejection fraction, by estimation, is 60 to 65%. The  left ventricle has normal function. The left ventricle has no regional  wall motion abnormalities. There is moderate left ventricular hypertrophy.  Left ventricular diastolic  parameters are consistent with Grade I diastolic dysfunction (impaired  relaxation). Elevated left atrial pressure. The average left ventricular  global longitudinal strain is -20.4 %. The global longitudinal strain is  normal.   2. Right ventricular systolic function is normal. The right ventricular  size is normal.   3. The mitral valve is normal in structure. Trivial mitral valve  regurgitation. No evidence of mitral stenosis.   4. The aortic valve is tricuspid. Aortic valve regurgitation is not  visualized. Mild aortic valve stenosis.   5. Aortic dilatation noted. There is mild dilatation of the ascending  aorta, measuring 43 mm.   6. The inferior vena cava is normal in size with greater than 50%  respiratory variability, suggesting right atrial pressure of 3 mmHg.   Comparison(s): 07/11/17 EF 60-65%. PA pressure .   Cardiac CT: Date: Results:  IMPRESSION: 1. Coronary calcium score of 157. This was 74th percentile for age, sex, and race matched control.   2. Normal coronary origin with right dominance.   3. CAD-RADS 3. Moderate stenosis. Consider symptom-guided anti-ischemic  pharmacotherapy as well as risk factor modification per guideline directed care. Additional analysis with CT FFR will be submitted.   4. Ascending aorta is mildly dilated for age and body surface area: 40 mm. Aortic atherosclerosis noted.   5. Mild dilation of the pulmonary artery.   6. Cannot exclude small patent foramen  ovale.   7. Small hiatal hernia; hepatic steatosis   Left/Right Heart Catheterizations: Date: 09/16/20 Results: Mid LAD lesion is 50% stenosed. 3rd Diag lesion is 60% stenosed. Dist LAD-1 lesion is 90% stenosed. Dist LAD-2 lesion is 40% stenosed.   1.  Significant one-vessel coronary artery disease involving the mid to distal LAD at the bifurcation of the third diagonal which has also 60% ostial stenosis.  The coronary arteries are very tortuous suggestive of hypertensive heart disease. 2.  Left ventricular angiography was not performed.  EF was normal by echo. 3.  Severely elevated blood pressure with mildly elevated left ventricular end-diastolic pressure. 4.  Transient left bundle branch block after engaging the left ventricle.  This resolved by the end of the case. 5.  Abdominal aortogram was performed and showed no evidence of renal artery stenosis.   Recommendations: Unfortunately, the LAD stenosis is in a very tortuous segment and at the origin of the third diagonal branch which also has ostial disease.  PCI in this area will be high risk for dissection as well as sidebranch closure especially in the setting of uncontrolled hypertension.  In addition, the distal LAD after the stenosis is diffusely diseased. I recommend maximizing medical therapy and controlling blood pressure before considering PCI.  I added amlodipine to her antihypertensive medications. Other complicating issue is that the patient cannot tolerate aspirin long-term.  If we decide to proceed with PCI at some point, we will have to make sure she tolerates ticagrelor which can likely be used as  monotherapy.   Recent Labs: 09/16/2020: TSH 3.053 09/19/2020: Magnesium 2.1 09/25/2020: ALT 22; B Natriuretic Peptide 106.8 10/19/2020: BUN 9; Creatinine, Ser 0.60; Hemoglobin 9.8; Platelets 354; Potassium 2.8; Sodium 141  Recent Lipid Panel No results found for: CHOL, TRIG, HDL, CHOLHDL, VLDL, LDLCALC, LDLDIRECT   Risk Assessment/Calculations:     N/A  Physical Exam:    Today's Vitals   11/03/20 1448  BP: 130/73  Pulse: 88  SpO2: 97%  Weight: 191 lb (86.6 kg)  Height: 5' 4.5" (1.638 m)   Body mass index is 32.28 kg/m.   Wt Readings from Last 3 Encounters:  11/03/20 191 lb (86.6 kg)  10/19/20 194 lb 8.9 oz (88.3 kg)  10/03/20 200 lb (90.7 kg)     GEN:  Well nourished, well developed in no acute distress HEENT: Bilateral Frank's Sign NECK: No JVD LYMPHATICS: No lymphadenopathy CARDIAC: Regular rhythm with irregular beats with rate PACs II/VI Systolic Crescendo RESPIRATORY:  Clear to auscultation without rales, wheezing or rhonchi  ABDOMEN: Soft, non-tender, non-distended MUSCULOSKELETAL:  No edema; No deformity  SKIN: Warm and dry NEUROLOGIC:  Alert and oriented x 3 PSYCHIATRIC:  Normal affect   ASSESSMENT:    1. Hypertension, unspecified type   2. Multiple drug allergies     PLAN:    Coronary Artery Disease - Sinus pause on nebivlol with Mobitz Type 1; stopped in the past) Symptomatic PAC Aortic Atherosclerosis Thoracic Aortic Aneurysm HLD Hypertensive Emergency Chronic Pain Syndome Multiple Drug Allergies Mild AS - will start 81 mg Po daily and stop plaix - continue atorvastatin 40 mg PO Daily - continue isordil 10 mg TID - losartan 100 mg PO Daily - continue spirolactone 25 mg PO Daily - ranolazine 500 mg PO BID (max dose due to fluconazole) Will set patient up with HTN clinic and allergy given her myriac of allegies - will get echo in July 2023 3-4 mont follow up  Time Spent Directly with Patient:   I  have spent a total of 65 minutes with  the patient reviewing notes, imaging, EKGs, labs and examining the patient as well as establishing an assessment and plan that was discussed personally with the patient.  > 50% of time was spent in direct patient care answering questions.   Medication Adjustments/Labs and Tests Ordered: Current medicines are reviewed at length with the patient today.  Concerns regarding medicines are outlined above.  Orders Placed This Encounter  Procedures   AMB Referral to [facility] Pharm-D   Ambulatory referral to Allergy   EKG 12-Lead    Meds ordered this encounter  Medications   aspirin EC 81 MG tablet    Sig: Take 1 tablet (81 mg total) by mouth daily. Swallow whole.    Dispense:  90 tablet    Refill:  3   atorvastatin (LIPITOR) 40 MG tablet    Sig: Take 1 tablet (40 mg total) by mouth daily.    Dispense:  90 tablet    Refill:  3   doxazosin (CARDURA) 1 MG tablet    Sig: Take 1 tablet (1 mg total) by mouth daily.    Dispense:  90 tablet    Refill:  3   isosorbide dinitrate (ISORDIL) 10 MG tablet    Sig: Take 1 tablet (10 mg total) by mouth 3 (three) times daily.    Dispense:  90 tablet    Refill:  0   ranolazine (RANEXA) 500 MG 12 hr tablet    Sig: Take 1 tablet (500 mg total) by mouth 2 (two) times daily.    Dispense:  180 tablet    Refill:  3   spironolactone (ALDACTONE) 25 MG tablet    Sig: Take 1 tablet (25 mg total) by mouth daily.    Dispense:  90 tablet    Refill:  3     Patient Instructions  Medication Instructions:  Your physician has recommended you make the following change in your medication:  STOP: clopidogrel (Plavix)  START:  Aspirin 81 mg by mouth daily  *If you need a refill on your cardiac medications before your next appointment, please call your pharmacy*   Lab Work: NONE If you have labs (blood work) drawn today and your tests are completely normal, you will receive your results only by: MyChart Message (if you have MyChart) OR A paper copy in the  mail If you have any lab test that is abnormal or we need to change your treatment, we will call you to review the results.   Testing/Procedures: Your physician has referred you to our Pharmacy clinic to help manage Hypertension.  Your physician has referred you to see an Allergy Specialist    Follow-Up: At [facility], you and your health needs are our priority.  As part of our continuing mission to provide you with exceptional heart care, we have created designated Provider Care Teams.  These Care Teams include your primary Cardiologist (physician) and Advanced Practice Providers (APPs -  Physician Assistants and Nurse Practitioners) who all work together to provide you with the care you need, when you need it.  We recommend signing up for the patient portal called "MyChart".  Sign up information is provided on this After Visit Summary.  MyChart is used to connect with patients for Virtual Visits (Telemedicine).  Patients are able to view lab/test results, encounter notes, upcoming appointments, etc.  Non-urgent messages can be sent to your provider as well.   To learn more about what you can do with  MyChart, go to [email or web address].    Your next appointment:   4 month(s)  The format for your next appointment:   In Person  Provider:   You may see Christell Constant, MD or one of the following Advanced Practice Providers on your designated Care Team:   Ronie Spies, PA-C Jacolyn Reedy, PA-C        Signed, Christell Constant, MD  11/03/2020 6:51 PM    [facility]

## 2020-11-04 ENCOUNTER — Telehealth: Payer: Self-pay | Admitting: Licensed Clinical Social Worker

## 2020-11-04 NOTE — Telephone Encounter (Signed)
LCSW contacted by [employer] regarding getting a ride for pt to [place] PCP office. I gave permission for ride to be scheduled one time. I have sent additional transportation resources to home address, since pt has Medicaid she also will be eligible for Medicaid transportation as well through the county.   Octavio Graves, MSW, LCSW [facility] Heart/Vascular Care Navigation  [phone]

## 2020-11-08 ENCOUNTER — Telehealth: Payer: Self-pay | Admitting: Internal Medicine

## 2020-11-08 NOTE — Telephone Encounter (Signed)
I called pt back in regards to previous call in.  She wants to know if it's okay to take ASA 81 mg.  I informed her that she was prescribed it at last OV and she should take it and if she develops side effects to call in.  She also wants to know if she should see the pharm d clinic.  She sees a Teacher, early years/pre with her PCP office.  I advised her to see our pharmacist since they specialize in cardiology.  Pt feels that she needs more time with provider to ask questions.  Would like to know if she needs a sooner appointment.  I could not find a sooner appointment d/t pt preference of PM only.  She also reports that she has an appointment with an allergist.  Would like for notes from LOV to be sent to PCP.  Pt thanked me for returning her call.

## 2020-11-08 NOTE — Telephone Encounter (Signed)
Patient returning call.

## 2020-11-08 NOTE — Telephone Encounter (Signed)
Left a message for pt to call back

## 2020-11-08 NOTE — Telephone Encounter (Signed)
Patient is requesting to speak with Alena Bills, RN to discuss medications and upcoming appointments. She declined going into detail and states she will discuss further when she speaks with the nurse. Please return call when able.

## 2020-11-11 ENCOUNTER — Telehealth: Payer: Self-pay | Admitting: Internal Medicine

## 2020-11-11 NOTE — Telephone Encounter (Signed)
Patient wanted to speak with [facility] regarding her appointment with the Pharmacist scheduled for 11/21/20. The patient said she will not be home Monday afternoon, so if Jennifer [name] is not available today or Monday morning,  it can wait until Wednesday

## 2020-11-14 NOTE — Telephone Encounter (Signed)
Patient returning call.

## 2020-11-14 NOTE — Telephone Encounter (Signed)
I left a message for pt to call back

## 2020-11-14 NOTE — Telephone Encounter (Signed)
Pt call in regards to pharmacist appointment.  She wants to know if it will be okay for her to have OV with our pharmacist/ HTN clinic.  And also an OV with pharmacist with [facility] physicians on 8/30.  She wants to know if both visits will be covered.  I advised her that I would send a message to pharmacy staff to see if they have insight and also to billing.

## 2020-11-15 NOTE — Telephone Encounter (Signed)
Called patient and left vm message advising that both pharmD visits should be paid, as they are with two different practice specialities and two different tax ID numbers.

## 2020-11-15 NOTE — Telephone Encounter (Signed)
PharmD appts are only billed as level 1 visits due to restrictions with CMS. We cannot tell how much of this her insurance would cover. Hoping billing can determine that and follow up with pt.

## 2020-11-21 ENCOUNTER — Ambulatory Visit: Payer: Medicare Other

## 2020-11-25 ENCOUNTER — Ambulatory Visit: Payer: Medicare Other | Admitting: Interventional Cardiology

## 2020-11-25 DIAGNOSIS — M7061 Trochanteric bursitis, right hip: Secondary | ICD-10-CM | POA: Diagnosis not present

## 2020-11-25 DIAGNOSIS — G894 Chronic pain syndrome: Secondary | ICD-10-CM | POA: Diagnosis not present

## 2020-11-25 DIAGNOSIS — M47816 Spondylosis without myelopathy or radiculopathy, lumbar region: Secondary | ICD-10-CM | POA: Diagnosis not present

## 2020-11-25 DIAGNOSIS — M48062 Spinal stenosis, lumbar region with neurogenic claudication: Secondary | ICD-10-CM | POA: Diagnosis not present

## 2020-11-25 DIAGNOSIS — M7918 Myalgia, other site: Secondary | ICD-10-CM | POA: Insufficient documentation

## 2020-11-25 DIAGNOSIS — G8929 Other chronic pain: Secondary | ICD-10-CM | POA: Insufficient documentation

## 2020-11-25 DIAGNOSIS — R399 Unspecified symptoms and signs involving the genitourinary system: Secondary | ICD-10-CM | POA: Diagnosis not present

## 2020-11-25 DIAGNOSIS — M7062 Trochanteric bursitis, left hip: Secondary | ICD-10-CM | POA: Diagnosis not present

## 2020-11-29 ENCOUNTER — Ambulatory Visit: Payer: Medicare Other

## 2020-11-30 ENCOUNTER — Ambulatory Visit: Payer: Medicare Other

## 2020-11-30 ENCOUNTER — Telehealth: Payer: Self-pay | Admitting: Internal Medicine

## 2020-11-30 NOTE — Telephone Encounter (Signed)
Called pt back in regards to BP cuff.  She reports that she was not eligible for BP cuff that transmits readings to PCP office.  Pt asked if we have a BP cuff she can have.  I informed her that we have a basic BP cuff that I will leave at the front desk for her to pick up.  Pt has medicaid thereby making her eligible for free BP cuff.  She thanked me for return call and expresses that she will come by the office on Friday to pick up BP cuff.  All questions answered.

## 2020-11-30 NOTE — Telephone Encounter (Signed)
New Message:     Patient called and said she would like for Jennifer [name] to please give her a call, concerning her blood pressure cuff.

## 2020-11-30 NOTE — Telephone Encounter (Signed)
Patient states when she received her last appointment reminder call, when accidentally selected the option to permanently disable reminder calls. She would like to know how this can be reversed. She states when she came is for her appointment no one was able to assist her with this and she never received a reminder for her upcoming appointment.

## 2020-12-08 ENCOUNTER — Other Ambulatory Visit: Payer: Self-pay | Admitting: *Deleted

## 2020-12-08 NOTE — Patient Outreach (Signed)
[facility]) Care Management  12/08/2020  Jennifer [name] 1945-07-07 [id number]   Referral Date: 9/1 Referral Source: Self Referral Reason: Care Coordination needs, list of SNF Insurance: Traditional Medicare   Outreach attempt #1, initially successful however member state this is not a good time to talk as she is waiting on a call from the MD office.  Plan: RN CM will await call back on Monday, if no call back will follow up within the next 3-4 business days.  Will also send outreach letter.  Kemper Durie, [place], MSN [facility] Care Management  [facility] Manager [phone]

## 2020-12-09 ENCOUNTER — Ambulatory Visit (INDEPENDENT_AMBULATORY_CARE_PROVIDER_SITE_OTHER): Payer: Medicare Other | Admitting: Pharmacist

## 2020-12-09 ENCOUNTER — Other Ambulatory Visit: Payer: Self-pay

## 2020-12-09 VITALS — BP 154/70 | HR 83

## 2020-12-09 DIAGNOSIS — I10 Essential (primary) hypertension: Secondary | ICD-10-CM

## 2020-12-09 NOTE — Patient Instructions (Addendum)
STOP taking doxazosin STOP Ranolazine Decrease hydralazine to 50mg  TID Continue losartan 100mg  daily and spironolactone 25mg  daily Check blood pressure once a day Please record your blood pressure and bring it with you to your next appointment.  Call me if your blood pressure is >180/100  Call me with questions [phone]

## 2020-12-09 NOTE — Progress Notes (Signed)
Patient ID: Jennifer [name]                 DOB: 1945-08-10                      MRN: [id number]     HPI: Jennifer [name] is a 75 y.o. female referred by Dr. Gasper Sells to HTN clinic. PMH is significant for hypertension, PVC, HLD, fibromyalgia, chronic pain syndrome with lumbar stenosis. Presented to urgent care and ED on 09/08/2020 with chest pain, SOB, headaches and was significantly hypertensive at 246/86. Patient had been taking losartan 100mg  daily and Bystolic 10mg  daily, but she reported her doctor switched to Bystolic 5mg  daily due to bradycardia. She noticed that her blood pressure was less controlled since the switch. EKG without concerning ischemic changes, normal troponin, labs are reassuring, chest x-ray unremarkable, blood pressure significantly improved after patient received labetalol.  Workup did not show signs of end organ damage to suggest hypertensive emergency and suspect patient's elevated blood pressure is multifactorial from changing her blood pressure medication as well as persistent back pain. On 09/15/2020, patient was sent to ED by Dr. Gasper Sells due to chest discomfort and HTN urgency and was admitted to the hospital 6/16 - 09/27/2020. 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 HTN medications that the patient was discharged with include hydralazine 25mg  TID, doxazosin 1mg  daily and spironolactone 25mg  daily. Was on amlodipine 10mg  daily during hospital stay but was discharged on nifedipine but was stopped due to concern over possible allergy. Hydralazine was increased to 75mg  TID on 8/2 by Dr. Gasper Sells. Was advised to be seen in the HTN clinic to maximize medical therapy and controlling blood pressure before considering PCI. She reports she has chronic back pain and when her pain is intense her blood pressure does rise. Patient is sensitive to medications, referred to allergy.  Patient  presents to clinic with complaints of severe weakness, constipation, heart pounding, urinary issues, nausea (improved), vision changes. Reports that since she started taking a lot of medications that she was discharged from the hospital with in June, she has been consistently feeling "awful" and has been experiencing a myriad of side effects. 30-45 mins after taking her AM medications, she experiences severe weakness throughout body. She can hardly function and has difficulty performing everyday activities (getting up, walking around house). No constipation in hospital, but since discharge, has been getting it.  Reports HR is usually mid-60s to 18s, but recently has been up to mid-90s and feels heart is constantly pounding, even at rest. Reports that Plavix was discontinued, and she no longer has fever but is still experiencing chills.  Usually takes atorvastatin, bASA, hydralazine, and doxazosin in AM. Patient reported taking only 2 of her AM medications this morning: losartan 100mg  daily and bASA, and has felt significantly better.  Patient reports she had a BP cuff from [facility] and was being followed by their PharmD. However at her last visit she was told she would have a copay/wasn't covered so she turned in her BP cuff. Now does not have one to check her blood pressure.  BP in clinic: 154/70 BP (with new home arm cuff): 149/78 HR: 83  Current HTN meds: losartan 100mg  daily, hydralazine 75mg  TID, spironolactone 25mg  daily, doxazosin 1mg  daily Previously tried: Bystolic (sinus pause with Mobitz Type 1), HCTZ (headaches), amlodipine (only had during hospitalization 6/16 - 09/27/2020)  BP goal: <130/80  Family History: The patient's family history includes Alzheimer's disease in her father; Asthma in her father; CAD in her father; COPD in her father; Cancer in her brother; Colonic polyp in her mother; Emphysema in her father; Heart failure in her mother; Hypertension in her father and mother;  Peripheral vascular disease in her mother; Stroke in her mother.  Social History: Lives alone.  Diet: Caffeine: drinks 2 glasses of tea/day.  Exercise: Not discussed at today's visit.  Home BP readings: none available  Wt Readings from Last 3 Encounters:  11/03/20 191 lb (86.6 kg)  10/19/20 194 lb 8.9 oz (88.3 kg)  10/03/20 200 lb (90.7 kg)   BP Readings from Last 3 Encounters:  11/03/20 130/73  10/20/20 (!) 155/77  10/03/20 (!) 168/78   Pulse Readings from Last 3 Encounters:  11/03/20 88  10/20/20 77  10/03/20 75    Renal function: CrCl cannot be calculated (Patient's most recent lab result is older than the maximum 21 days allowed.).  Past Medical History:  Diagnosis Date   Acute meniscal tear of left knee    FOLLOWED BY DR GIAFFREY   BPPV (benign paroxysmal positional vertigo)    Chronic bladder pain    Chronic fatigue syndrome    Chronic low back pain    W/ RIGHT LEG PAIN AND NUMBNESS   Cystitis, chronic    Fibromyalgia    GERD (gastroesophageal reflux disease)    Hiatal hernia    Hypertension    IC (interstitial cystitis)    OSA (obstructive sleep apnea)    per pt study yrs ago-- moderate osa ,  intolerant cpap   Pinched vertebral nerve    bilateral L2 -- L3 and L3 -- L4-/  epidural injection's treatment , PT and pain clince   PONV (postoperative nausea and vomiting)    severe   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     aspirin EC 81 MG tablet Take 1 tablet (81 mg total) by mouth daily. Swallow whole. 90 tablet 3   atorvastatin (LIPITOR) 40 MG tablet Take 1 tablet (40 mg  total) by mouth daily. 90 tablet 3   D-Mannose 500 MG CAPS Take 500-1,500 mg by mouth 3 (three) times daily.     doxazosin (CARDURA) 1 MG tablet Take 1 tablet (1 mg total) by mouth daily. 90 tablet 3   fexofenadine (ALLEGRA) 60 MG tablet Take 60 mg by mouth See admin instructions. Take 60 mg by mouth one to two times a day as needed for allergy symptoms     fluconazole (DIFLUCAN) 100 MG tablet Take 1 tablet (100 mg total) by mouth daily. 10 tablet 3   fosfomycin (MONUROL) 3 g PACK Take 3 g by mouth once.     hydrALAZINE (APRESOLINE) 50 MG tablet Take 1.5 tablets (75 mg total) by mouth 3 (three) times daily. 405 tablet 3   ipratropium (ATROVENT) 0.06 % nasal spray Place 2 sprays into both nostrils 2 (two) times daily as needed for rhinitis.     isosorbide dinitrate (ISORDIL)  10 MG tablet Take 1 tablet (10 mg total) by mouth 3 (three) times daily. 90 tablet 0   losartan (COZAAR) 100 MG tablet Take 100 mg by mouth daily.     meclizine (ANTIVERT) 25 MG tablet Take 25 mg by mouth 3 (three) times daily as needed for dizziness.     Meth-Hyo-M Bl-Benz Acd-Ph Sal 81.6 MG TABS Take 1 tablet (81.6 mg total) by mouth 2 (two) times daily as needed. 30 each 1   methenamine (HIPREX) 1 G tablet Take by mouth as directed. TAKES WHEN NOT TAKING ANTIBIOTICS     nitroGLYCERIN (NITROSTAT) 0.4 MG SL tablet Place 1 tablet (0.4 mg total) under the tongue every 5 (five) minutes as needed for chest pain. 20 tablet 1   nystatin cream (MYCOSTATIN) Apply topically 2 (two) times daily. 30 g 3   omeprazole (PRILOSEC) 20 MG capsule Take 20 mg by mouth See admin instructions. Take 20 mg by mouth in the morning one hour before breakfast and an additional 20 mg before evening meal as needed for GERD     ondansetron (ZOFRAN-ODT) 4 MG disintegrating tablet Take 4 mg by mouth every 8 (eight) hours as needed for nausea (dissolve orally).  1   promethazine (PHENERGAN) 25 MG tablet Take 1 tablet (25 mg total) by mouth every 6 (six) hours  as needed for refractory nausea / vomiting. 30 tablet 0   ranolazine (RANEXA) 500 MG 12 hr tablet Take 1 tablet (500 mg total) by mouth 2 (two) times daily. 180 tablet 3   spironolactone (ALDACTONE) 25 MG tablet Take 1 tablet (25 mg total) by mouth daily. 90 tablet 3   sucralfate (CARAFATE) 1 G tablet Take 1 g by mouth See admin instructions. Take 1 gram by mouth in the morning and an additional 1 gram once a day for flares     triamcinolone cream (KENALOG) 0.1 % Apply 1 application topically daily as needed (for eczema).  0   No current facility-administered medications on file prior to visit.    Allergies  Allergen Reactions   Moxifloxacin Hives, Shortness Of Breath, Swelling and Rash    Rash was severe, caused tremors, ans skin became BLOODY RED   Quinolones Anaphylaxis   Sulfa Antibiotics Hives and Rash   Furosemide Other (See Comments)    CANNOT TAKE DUE TO Interstitial cystitis   Aspirin Nausea Only and Other (See Comments)    GI and stomach upset   Duloxetine Hcl Nausea Only and Other (See Comments)    Vertigo and heart racing also    Hydrochlorothiazide Other (See Comments)    Headaches    Ibuprofen Nausea Only and Other (See Comments)    GI upset   Other Other (See Comments)   Robaxin [Methocarbamol] Other (See Comments)    "Make me feel flu-like symptoms"   Topiramate Itching   Zofran [Ondansetron Hcl] Other (See Comments)    Headaches    Codeine Hives, Nausea And Vomiting and Rash   Dimethyl Sulfoxide Hives and Itching   Macrodantin [Nitrofurantoin Macrocrystal] Itching, Nausea And Vomiting and Rash    Abdominal and chest pain, also- blood-red skin, also   Nitrofurantoin Diarrhea, Itching, Rash and Other (See Comments)    GI/stomach upset, sweating, chest pain, and skin turned bloody red   Tape Rash   Yellow Dye Rash    There were no vitals taken for this visit.   Assessment/Plan:  1. Hypertension - Blood pressure is not at goal of <130/80. Continue  losartan $RemoveB'100mg'GVqhaWgV$   daily and spironolactone 66m daily.  Discontinue doxazosin 123mdaily, as it can cause orthostatic hypotension effect that may have been exaggerated by AM timing. I suspect this may be the cause of her weakness after AM meds. Discontinue ranolazine 5001mID. I suspect that ranolazine may be contributing to symptoms (possibly constipation/heart pounding). Will consider reintroducing ranolazine in the future if symptoms improve. Decrease hydralazine to 74m20mD. Patient was given a new BP cuff and counseled to check BP once a day and bring in log at next visit. Also instructed to contact pharmacist if SBP >180 or DBP >100. Encouraged to take Miralax every day (not feeling taste when mixing with orange juice). Patient was concerned about diabetes after BG of 115 in hospital, due to time constraints, will discuss at next visit. Follow up in clinic in 2 weeks. Patient has appointments scheduled with [facility] and allergy on 10/18, urology.  >60 min spent face to face with patient.  Thank you  LisaMelanie Crazierudent Pharmacist   MeliRamond DialarNacoBCPS, CPP [id number] StreeteeMartinsburg [id number]: [phone]: [phone]

## 2020-12-14 ENCOUNTER — Other Ambulatory Visit: Payer: Self-pay | Admitting: *Deleted

## 2020-12-14 NOTE — Patient Outreach (Signed)
[facility]) Care Management  12/14/2020  Jennifer [name] 06-06-1945 [id number]   Incoming call received from member apologizing for not returning call on Monday and stating she will need to reschedule outreach call planned for this afternoon.  Request this care manager call back on Tuesday 9/20 at 130pm.  Will make 3rd attempt for screening at that time.  Kemper Durie, [place], MSN [facility] Care Management  [facility] Manager [phone]

## 2020-12-18 ENCOUNTER — Other Ambulatory Visit: Payer: Self-pay | Admitting: Internal Medicine

## 2020-12-20 ENCOUNTER — Other Ambulatory Visit: Payer: Self-pay | Admitting: *Deleted

## 2020-12-20 DIAGNOSIS — I10 Essential (primary) hypertension: Secondary | ICD-10-CM

## 2020-12-20 NOTE — Patient Outreach (Signed)
[facility]) Care Management  12/20/2020  Jennifer [name] 06/12/1945 [id number]   Referral Date: 9/1 Referral Source: Self Referral Reason: Care Coordination needs, list of SNF Insurance: Traditional Medicare     Outreach attempt #3, successful.  Identity verified.  This care manager introduced self and stated purpose of call.  [facility] care management services explained.    Social: Lives alone, state she is able to perform all ADL's independently, does take time in the mornings due to chronic pain.    Conditions: Per chart, has history of HTN, CHF, GERD, IBS, Hypothyroidism, Vertigo, fibromyalgia, Degenerative disc disease, and anxiety.    Medications: Report she is taking as prescribed, no concerns with financial assistance.  State after her hospitalization for HTN early this year, she had to take some time to adjust to antihypertensives.  Report blood pressure is stable now, just received new machine to start home monitoring.  Appointments: Has appointment with PCP tomorrow, will use [facility] transportation.  She is concerned about ongoing transportation needs as she is no longer able to borrow her friend's car.  Also scheduled to follow up with GI for chronic GERD on 10/5.  Consent: Agrees to [facility] services.  Plan: RN CM will place referral to health coach for ongoing disease management for HTN.  Will also place referral to Care Guide for transportation services.  Kemper Durie, [place], MSN [facility] Care Management  [facility] Manager [phone]

## 2020-12-21 ENCOUNTER — Other Ambulatory Visit: Payer: Self-pay | Admitting: Infectious Diseases

## 2020-12-21 DIAGNOSIS — B379 Candidiasis, unspecified: Secondary | ICD-10-CM

## 2020-12-22 NOTE — Telephone Encounter (Signed)
Please advise if okay to refill. 

## 2020-12-23 ENCOUNTER — Other Ambulatory Visit: Payer: Self-pay | Admitting: *Deleted

## 2020-12-27 ENCOUNTER — Other Ambulatory Visit: Payer: Self-pay | Admitting: Infectious Diseases

## 2020-12-27 ENCOUNTER — Ambulatory Visit: Payer: Medicare Other

## 2020-12-28 ENCOUNTER — Telehealth: Payer: Self-pay

## 2020-12-28 NOTE — Telephone Encounter (Signed)
Patient called said she would like a nurse to call her back at [phone], To discuss some medications and have a message relayed to Dr. Ninetta Lights. Nothing urgent she said and did not want to schedule office visit at this time.

## 2020-12-28 NOTE — Telephone Encounter (Signed)
Patient wished to review recent medication refill (diflucan sent in) and review recent hospitalizations from this summer. Wasn't sure if provider was aware or had received results. RN assured patient that Dr. Ninetta Lights has access to all of the records since it's wihtin [facility] system.   Ciara Kagan Loyola Mast, RN

## 2020-12-28 NOTE — Telephone Encounter (Signed)
   Telephone encounter was:  Successful.  12/28/2020 Name: Jennifer [name] MRN: [id number] DOB: 08/05/1945  Jennifer [name] is a 75 y.o. year old female who is a primary care patient of [facility] . The community resource team was consulted for assistance with Transportation Needs   Care guide performed the following interventions:  Patient  advised she is already enrolled into [facility] Transportation  Follow Up Plan:  No further follow up planned at this time. The patient has been provided with needed resources.  [facility] Guide, Embedded Care Coordination [facility]  [place], [place] [address]  Main Phone: [phone]  E-mail: [email or web address]  Website: [email or web address]

## 2020-12-29 ENCOUNTER — Ambulatory Visit: Payer: Medicare Other

## 2020-12-29 ENCOUNTER — Telehealth: Payer: Self-pay | Admitting: Pharmacist

## 2020-12-29 NOTE — Telephone Encounter (Signed)
Patient called back. She has been rescheduled for the following week with me.

## 2020-12-29 NOTE — Telephone Encounter (Signed)
Called patient to let her know that I would not be in the office on Friday. Aundra Millet Supple will be available to see her. Calling to see if patient is ok with this or would like to reschedule.

## 2020-12-30 ENCOUNTER — Ambulatory Visit: Payer: Medicare Other | Admitting: Internal Medicine

## 2021-01-04 DIAGNOSIS — N390 Urinary tract infection, site not specified: Secondary | ICD-10-CM | POA: Diagnosis not present

## 2021-01-04 DIAGNOSIS — N3010 Interstitial cystitis (chronic) without hematuria: Secondary | ICD-10-CM | POA: Diagnosis not present

## 2021-01-05 DIAGNOSIS — E559 Vitamin D deficiency, unspecified: Secondary | ICD-10-CM | POA: Diagnosis not present

## 2021-01-05 DIAGNOSIS — I10 Essential (primary) hypertension: Secondary | ICD-10-CM | POA: Diagnosis not present

## 2021-01-05 DIAGNOSIS — E7800 Pure hypercholesterolemia, unspecified: Secondary | ICD-10-CM | POA: Diagnosis not present

## 2021-01-05 DIAGNOSIS — Z23 Encounter for immunization: Secondary | ICD-10-CM | POA: Diagnosis not present

## 2021-01-06 ENCOUNTER — Telehealth: Payer: Self-pay | Admitting: Internal Medicine

## 2021-01-06 ENCOUNTER — Ambulatory Visit: Payer: Medicare Other

## 2021-01-06 NOTE — Telephone Encounter (Signed)
Ok to take higher dose, guaifenesin component doesn't affect BP. Max daily dose is 2.4g though so she can only take 2 of that dose each day.

## 2021-01-06 NOTE — Telephone Encounter (Signed)
Patient calling ask to speak to nurse about medication. Want to make sure she can take and what strength. Please advise

## 2021-01-06 NOTE — Telephone Encounter (Signed)
Called pt and informed her of pharmacist recommendation.  She thanked me for the return call.

## 2021-01-06 NOTE — Telephone Encounter (Signed)
Spoke with pt today she reports that a scheduling supervisor Fayrene Fearing) has fixed her appointment reminders.

## 2021-01-06 NOTE — Telephone Encounter (Signed)
Called pt back in regards to question regarding medication.  She reports that she normally takes Mucinex DM ( guaifenesin 600 mg dextromethorphan 60 mg).  She currently has guaifenesin 1200 mg dextromethorphan 30 and wants to know if she can take this dose.  I advised her that medication could be contributing to HTN.  She reports she only takes med PRN and last took it yesterday morning and BP was 128/72.  Will route to pharm D for advise.

## 2021-01-09 ENCOUNTER — Observation Stay (HOSPITAL_COMMUNITY)
Admission: EM | Admit: 2021-01-09 | Discharge: 2021-01-12 | Disposition: A | Discharge status: Home / Self Care | Payer: Medicare Other | Attending: Internal Medicine | Admitting: Internal Medicine

## 2021-01-09 ENCOUNTER — Emergency Department (HOSPITAL_COMMUNITY): Payer: Medicare Other

## 2021-01-09 ENCOUNTER — Encounter (HOSPITAL_COMMUNITY): Payer: Self-pay | Admitting: Emergency Medicine

## 2021-01-09 ENCOUNTER — Other Ambulatory Visit: Payer: Self-pay

## 2021-01-09 DIAGNOSIS — Z79899 Other long term (current) drug therapy: Secondary | ICD-10-CM | POA: Insufficient documentation

## 2021-01-09 DIAGNOSIS — I4892 Unspecified atrial flutter: Secondary | ICD-10-CM | POA: Diagnosis not present

## 2021-01-09 DIAGNOSIS — I10 Essential (primary) hypertension: Secondary | ICD-10-CM | POA: Diagnosis not present

## 2021-01-09 DIAGNOSIS — R0789 Other chest pain: Secondary | ICD-10-CM | POA: Diagnosis not present

## 2021-01-09 DIAGNOSIS — R002 Palpitations: Secondary | ICD-10-CM

## 2021-01-09 DIAGNOSIS — R079 Chest pain, unspecified: Secondary | ICD-10-CM | POA: Diagnosis present

## 2021-01-09 DIAGNOSIS — Z20822 Contact with and (suspected) exposure to covid-19: Secondary | ICD-10-CM | POA: Insufficient documentation

## 2021-01-09 DIAGNOSIS — I2510 Atherosclerotic heart disease of native coronary artery without angina pectoris: Secondary | ICD-10-CM | POA: Diagnosis not present

## 2021-01-09 DIAGNOSIS — R0602 Shortness of breath: Secondary | ICD-10-CM | POA: Diagnosis not present

## 2021-01-09 DIAGNOSIS — I209 Angina pectoris, unspecified: Secondary | ICD-10-CM

## 2021-01-09 DIAGNOSIS — Z7982 Long term (current) use of aspirin: Secondary | ICD-10-CM | POA: Diagnosis not present

## 2021-01-09 LAB — COMPREHENSIVE METABOLIC PANEL
ALT: 19 U/L (ref 0–44)
AST: 25 U/L (ref 15–41)
Albumin: 3.9 g/dL (ref 3.5–5.0)
Alkaline Phosphatase: 62 U/L (ref 38–126)
Anion gap: 11 (ref 5–15)
BUN: 12 mg/dL (ref 8–23)
CO2: 23 mmol/L (ref 22–32)
Calcium: 9.2 mg/dL (ref 8.9–10.3)
Chloride: 105 mmol/L (ref 98–111)
Creatinine, Ser: 0.88 mg/dL (ref 0.44–1.00)
GFR, Estimated: >60 mL/min (ref >60)
Glucose, Bld: 117 mg/dL — ABNORMAL HIGH (ref 70–99)
Potassium: 4 mmol/L (ref 3.5–5.1)
Sodium: 139 mmol/L (ref 135–145)
Total Bilirubin: 0.7 mg/dL (ref 0.3–1.2)
Total Protein: 7 g/dL (ref 6.5–8.1)

## 2021-01-09 LAB — CBC
HCT: 36.8 % (ref 36.0–46.0)
Hemoglobin: 11.8 g/dL — ABNORMAL LOW (ref 12.0–15.0)
MCH: 28.4 pg (ref 26.0–34.0)
MCHC: 32.1 g/dL (ref 30.0–36.0)
MCV: 88.5 fL (ref 80.0–100.0)
Platelets: 311 10*3/uL (ref 150–400)
RBC: 4.16 MIL/uL (ref 3.87–5.11)
RDW: 14.8 % (ref 11.5–15.5)
WBC: 9.8 10*3/uL (ref 4.0–10.5)
nRBC: 0 % (ref 0.0–0.2)

## 2021-01-09 LAB — TROPONIN I (HIGH SENSITIVITY): Troponin I (High Sensitivity): 56 ng/L — ABNORMAL HIGH (ref <18)

## 2021-01-09 NOTE — ED Triage Notes (Signed)
Patient arrived with EMS from home reports central chest pain with mild SOB and palpitations this afternoon , she received 2 NTG sl by EMS prior to arrival with mild relief , no emesis or diaphoresis .

## 2021-01-09 NOTE — ED Provider Notes (Signed)
Emergency Medicine Provider Triage Evaluation Note  Jennifer [name] , a 75 y.o. female  was evaluated in triage.  Pt complains of central chest pain with some shortness of breath and heart palpitations.  She states that she has nearly daily chest pain has been evaluated and states that she has CAD but opted for no stent and medical management because of location.  She states that her pain improved from 8/10-5/10 with 2 nitroglycerin given per EMS.  She has a history of severe hypertension which has been difficult to control in the past.  No nausea or vomiting.  Does endorse palpitations.  No leg swelling.  Review of Systems  Positive: Chest pain, short of breath, palpitations Negative: Fever  Physical Exam  BP (!) 179/91   Pulse 90   Resp 16   SpO2 97%  Gen:   Awake, no distress   Resp:  Normal effort  MSK:   Moves extremities without difficulty  Other:  Murmur  Medical Decision Making  Medically screening exam initiated at 9:58 PM.  Appropriate orders placed.  Jennifer [name] was informed that the remainder of the evaluation will be completed by another provider, this initial triage assessment does not replace that evaluation, and the importance of remaining in the ED until their evaluation is complete.       Patient with history of murmur This is not a new murmur  Patient presented to the ER today with chest pain palpitations shortness of breath  Will obtain chest pain work-up.   Jennifer [name], [place] 01/09/21 2205    Jennifer Loveless, MD 01/10/21 2226

## 2021-01-10 ENCOUNTER — Other Ambulatory Visit: Payer: Self-pay | Admitting: *Deleted

## 2021-01-10 ENCOUNTER — Observation Stay (HOSPITAL_BASED_OUTPATIENT_CLINIC_OR_DEPARTMENT_OTHER): Payer: Medicare Other

## 2021-01-10 ENCOUNTER — Ambulatory Visit: Payer: Medicare Other

## 2021-01-10 DIAGNOSIS — I4892 Unspecified atrial flutter: Secondary | ICD-10-CM

## 2021-01-10 DIAGNOSIS — R0789 Other chest pain: Secondary | ICD-10-CM | POA: Diagnosis not present

## 2021-01-10 DIAGNOSIS — I2510 Atherosclerotic heart disease of native coronary artery without angina pectoris: Secondary | ICD-10-CM | POA: Diagnosis not present

## 2021-01-10 DIAGNOSIS — R079 Chest pain, unspecified: Secondary | ICD-10-CM | POA: Diagnosis present

## 2021-01-10 DIAGNOSIS — I10 Essential (primary) hypertension: Secondary | ICD-10-CM | POA: Diagnosis not present

## 2021-01-10 DIAGNOSIS — I483 Typical atrial flutter: Secondary | ICD-10-CM

## 2021-01-10 DIAGNOSIS — I208 Other forms of angina pectoris: Secondary | ICD-10-CM | POA: Diagnosis not present

## 2021-01-10 LAB — URINALYSIS, ROUTINE W REFLEX MICROSCOPIC
Bilirubin Urine: NEGATIVE
Glucose, UA: NEGATIVE mg/dL
Hgb urine dipstick: NEGATIVE
Ketones, ur: NEGATIVE mg/dL
Nitrite: POSITIVE — AB
Protein, ur: NEGATIVE mg/dL
Specific Gravity, Urine: 1.016 (ref 1.005–1.030)
WBC, UA: >50 WBC/hpf — ABNORMAL HIGH (ref 0–5)
pH: 7 (ref 5.0–8.0)

## 2021-01-10 LAB — ECHOCARDIOGRAM COMPLETE
AR max vel: 1.89 cm2
AV Area VTI: 2.28 cm2
AV Area mean vel: 2.12 cm2
AV Mean grad: 16 mmHg
AV Peak grad: 32.5 mmHg
Ao pk vel: 2.85 m/s
Area-P 1/2: 2.55 cm2
Height: 64 in
S' Lateral: 2.9 cm
Weight: 3350.99 oz

## 2021-01-10 LAB — RESP PANEL BY RT-PCR (FLU A&B, COVID) ARPGX2
Influenza A by PCR: NEGATIVE
Influenza B by PCR: NEGATIVE
SARS Coronavirus 2 by RT PCR: NEGATIVE

## 2021-01-10 LAB — MAGNESIUM: Magnesium: 2.1 mg/dL (ref 1.7–2.4)

## 2021-01-10 LAB — PHOSPHORUS: Phosphorus: 4.1 mg/dL (ref 2.5–4.6)

## 2021-01-10 LAB — TSH: TSH: 1.973 u[IU]/mL (ref 0.350–4.500)

## 2021-01-10 LAB — TROPONIN I (HIGH SENSITIVITY): Troponin I (High Sensitivity): 54 ng/L — ABNORMAL HIGH (ref <18)

## 2021-01-10 MED ORDER — ENOXAPARIN SODIUM 40 MG/0.4ML IJ SOSY
40.0000 mg | PREFILLED_SYRINGE | INTRAMUSCULAR | Status: DC
  Administered 2021-01-10 – 2021-01-11 (×2): 40 mg via SUBCUTANEOUS
  Filled 2021-01-10 (×2): qty 0.4

## 2021-01-10 MED ORDER — ONDANSETRON 4 MG PO TBDP
4.0000 mg | ORAL_TABLET | Freq: Three times a day (TID) | ORAL | Status: DC | PRN
  Filled 2021-01-10: qty 1

## 2021-01-10 MED ORDER — ISOSORBIDE DINITRATE 10 MG PO TABS
10.0000 mg | ORAL_TABLET | Freq: Three times a day (TID) | ORAL | Status: DC
  Administered 2021-01-10 – 2021-01-12 (×6): 10 mg via ORAL
  Filled 2021-01-10 (×9): qty 1

## 2021-01-10 MED ORDER — ATORVASTATIN CALCIUM 40 MG PO TABS
40.0000 mg | ORAL_TABLET | Freq: Every day | ORAL | Status: DC
  Administered 2021-01-10 – 2021-01-12 (×3): 40 mg via ORAL
  Filled 2021-01-10 (×3): qty 1

## 2021-01-10 MED ORDER — METOPROLOL TARTRATE 5 MG/5ML IV SOLN
5.0000 mg | Freq: Once | INTRAVENOUS | Status: AC
  Administered 2021-01-10: 5 mg via INTRAVENOUS
  Filled 2021-01-10: qty 5

## 2021-01-10 MED ORDER — SUCRALFATE 1 G PO TABS
1.0000 g | ORAL_TABLET | Freq: Two times a day (BID) | ORAL | Status: DC | PRN
  Administered 2021-01-12: 1 g via ORAL
  Filled 2021-01-10: qty 1

## 2021-01-10 MED ORDER — FOSFOMYCIN TROMETHAMINE 3 G PO PACK
3.0000 g | PACK | ORAL | Status: DC
  Administered 2021-01-10: 3 g via ORAL
  Filled 2021-01-10: qty 3

## 2021-01-10 MED ORDER — PANTOPRAZOLE SODIUM 40 MG PO TBEC
40.0000 mg | DELAYED_RELEASE_TABLET | Freq: Every day | ORAL | Status: DC
  Administered 2021-01-10 – 2021-01-12 (×3): 40 mg via ORAL
  Filled 2021-01-10 (×3): qty 1

## 2021-01-10 MED ORDER — HYDROCODONE-ACETAMINOPHEN 7.5-325 MG PO TABS
1.0000 | ORAL_TABLET | Freq: Three times a day (TID) | ORAL | Status: DC | PRN
  Administered 2021-01-11 – 2021-01-12 (×5): 1 via ORAL
  Filled 2021-01-10 (×7): qty 1

## 2021-01-10 MED ORDER — DM-GUAIFENESIN ER 30-600 MG PO TB12
1.0000 | ORAL_TABLET | Freq: Two times a day (BID) | ORAL | Status: DC | PRN
  Filled 2021-01-10: qty 1

## 2021-01-10 MED ORDER — MECLIZINE HCL 25 MG PO TABS: 25.0000 mg | ORAL_TABLET | Freq: Three times a day (TID) | ORAL | Status: DC | PRN

## 2021-01-10 MED ORDER — SODIUM CHLORIDE 0.9 % IV SOLN
2.0000 g | Freq: Once | INTRAVENOUS | Status: AC
  Administered 2021-01-10: 2 g via INTRAVENOUS
  Filled 2021-01-10: qty 20

## 2021-01-10 MED ORDER — HYDROCORTISONE ACETATE 25 MG RE SUPP
25.0000 mg | Freq: Two times a day (BID) | RECTAL | Status: DC | PRN
  Filled 2021-01-10: qty 1

## 2021-01-10 MED ORDER — SPIRONOLACTONE 25 MG PO TABS
25.0000 mg | ORAL_TABLET | Freq: Every day | ORAL | Status: DC
  Administered 2021-01-10 – 2021-01-12 (×3): 25 mg via ORAL
  Filled 2021-01-10 (×3): qty 1

## 2021-01-10 MED ORDER — NYSTATIN 100000 UNIT/GM EX CREA
TOPICAL_CREAM | Freq: Two times a day (BID) | CUTANEOUS | Status: DC
  Filled 2021-01-10 (×3): qty 15

## 2021-01-10 MED ORDER — LORATADINE 10 MG PO TABS
10.0000 mg | ORAL_TABLET | Freq: Every day | ORAL | Status: DC
  Administered 2021-01-10 – 2021-01-12 (×3): 10 mg via ORAL
  Filled 2021-01-10 (×3): qty 1

## 2021-01-10 MED ORDER — ASPIRIN EC 81 MG PO TBEC
81.0000 mg | DELAYED_RELEASE_TABLET | Freq: Every day | ORAL | Status: DC
  Administered 2021-01-10 – 2021-01-12 (×3): 81 mg via ORAL
  Filled 2021-01-10 (×3): qty 1

## 2021-01-10 MED ORDER — ACETAMINOPHEN 500 MG PO TABS: 500.0000 mg | ORAL_TABLET | Freq: Two times a day (BID) | ORAL | Status: DC | PRN

## 2021-01-10 MED ORDER — METOPROLOL TARTRATE 25 MG PO TABS
25.0000 mg | ORAL_TABLET | Freq: Two times a day (BID) | ORAL | Status: DC
  Administered 2021-01-10 – 2021-01-12 (×5): 25 mg via ORAL
  Filled 2021-01-10 (×5): qty 1

## 2021-01-10 MED ORDER — AMOXICILLIN-POT CLAVULANATE 500-125 MG PO TABS: 500.0000 mg | ORAL_TABLET | Freq: Three times a day (TID) | ORAL | Status: DC

## 2021-01-10 MED ORDER — LOSARTAN POTASSIUM 50 MG PO TABS
100.0000 mg | ORAL_TABLET | Freq: Every day | ORAL | Status: DC
  Administered 2021-01-10 – 2021-01-12 (×3): 100 mg via ORAL
  Filled 2021-01-10 (×3): qty 2

## 2021-01-10 MED ORDER — ACETAMINOPHEN 325 MG PO TABS
650.0000 mg | ORAL_TABLET | ORAL | Status: DC | PRN
  Administered 2021-01-10: 650 mg via ORAL
  Filled 2021-01-10: qty 2

## 2021-01-10 MED ORDER — HYDRALAZINE HCL 50 MG PO TABS
50.0000 mg | ORAL_TABLET | Freq: Three times a day (TID) | ORAL | Status: DC
  Administered 2021-01-10 – 2021-01-12 (×6): 50 mg via ORAL
  Filled 2021-01-10 (×6): qty 1

## 2021-01-10 MED ORDER — NITROGLYCERIN 0.4 MG SL SUBL
0.4000 mg | SUBLINGUAL_TABLET | SUBLINGUAL | Status: DC | PRN
  Administered 2021-01-10 (×3): 0.4 mg via SUBLINGUAL
  Filled 2021-01-10: qty 1

## 2021-01-10 MED ORDER — CARVEDILOL 3.125 MG PO TABS: 3.1250 mg | ORAL_TABLET | Freq: Two times a day (BID) | ORAL | Status: DC

## 2021-01-10 NOTE — H&P (Addendum)
History and Physical    Jennifer [name] YVO:[id number] DOB: 23-May-1945 DOA: 01/09/2021  PCP: Jarrett Soho, PA-C (Confirm with patient/family/NH records and if not entered, this has to be entered at [facility] point of entry) Patient coming from: Home  I have personally briefly reviewed patient's old medical records in [facility]  Chief Complaint: Chest pain, palpitations.  HPI: Jennifer [name] is a 75 y.o. female with medical history significant of CAD with multivessel disease and significantly LAD 1> 90% stenosis June 2022, HTN, HLD, GERD, BPPV, spinal stenosis lumbar region, presented with recurrent chest pains and palpitations.  Patient was diagnosed with multivessel CAD June 2022, with a significant LAD 1 with a 90% stenosis.  And patient was found to have significant elevation in blood pressure, discharged on multiple blood pressure meds.  Was intolerant to beta-blocker and Lasix in the past, and in June she was started on hydralazine, losartan, Imdur.  Patient reported that she had episodes of palpitations, and she went back to cardiologist recently who further adjusted her BP meds, hydralazine dose was decreased from 75 to 50 mg 3 times daily, Ranexa was replaced by Imdur and losartan was continued.    However patient continued to experience frequent episodes of palpitations, and since last week, even minimal activity can trigger significant feeling of palpitations.  And yesterday, she even experienced 2-3 episodes of chest tightness along with palpitations, and also feeling of shortness of breath.  Denies any fever or chills.  She has had recurrent cystitis flareup yesterday, with urinary frequency and PCP plan to start Augmentin today.  Last night, she checked her blood pressure at home and found SBP 220 and she took SL nitro 2 times but still having more episodes of palpitations and chest pains, thus called EMS.  ED Course: Blood pressure varies, SBP 150s to 200s.  Telemetry  monitoring showed frequent PVCs sometimes bigeminy's.  EKG sinus tachycardia.  Troponin 56> 54.  EKG, no acute ST changes.  Chest x-ray no acute infiltrates.  Review of Systems: As per HPI otherwise 14 point review of systems negative.    Past Medical History:  Diagnosis Date   Acute meniscal tear of left knee    FOLLOWED BY DR GIAFFREY   BPPV (benign paroxysmal positional vertigo)    Chronic bladder pain    Chronic fatigue syndrome    Chronic low back pain    W/ RIGHT LEG PAIN AND NUMBNESS   Cystitis, chronic    Fibromyalgia    GERD (gastroesophageal reflux disease)    Hiatal hernia    Hypertension    IC (interstitial cystitis)    OSA (obstructive sleep apnea)    per pt study yrs ago-- moderate osa ,  intolerant cpap   Pinched vertebral nerve    bilateral L2 -- L3 and L3 -- L4-/  epidural injection's treatment , PT and pain clince   PONV (postoperative nausea and vomiting)    severe   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    Self-catheterizes urinary bladder    Spinal stenosis, lumbar region with neurogenic claudication    Wears glasses    Wears partial dentures    upper and lower    Past Surgical History:  Procedure Laterality Date   ABDOMINAL AORTOGRAM N/A 09/16/2020   Procedure: ABDOMINAL AORTOGRAM;  Surgeon: Iran Ouch, MD;  Location: [facility];  Service: Cardiovascular;  Laterality: N/A;   ABDOMINAL HYSTERECTOMY  1980  w/ Left Salpingoophorectomy   CARDIAC CATHETERIZATION  08-21-2001  dr Verdis Prime   normal coronary arteries and LVF   CARPAL TUNNEL RELEASE Bilateral 1986   CECALCYSTOPLASTY/  APPENDECTOMY  1984   at [facility]   CHOLECYSTECTOMY  1992   CYSTO WITH HYDRODISTENSION N/A 01/12/2016   Procedure: CYSTOSCOPY/HYDRODISTENSION;  Surgeon: Jethro Bolus, MD;  Location: [facility];  Service: Urology;  Laterality: N/A;   CYSTO WITH HYDRODISTENSION N/A 08/30/2016   Procedure:  CYSTOSCOPY/HYDRODISTENSION OF BLADDER INJECTION OF MARCAINE/PYRIDIUM;  Surgeon: Jethro Bolus, MD;  Location: [facility];  Service: Urology;  Laterality: N/A;   LEFT HEART CATH AND CORONARY ANGIOGRAPHY N/A 09/16/2020   Procedure: LEFT HEART CATH AND CORONARY ANGIOGRAPHY;  Surgeon: Iran Ouch, MD;  Location: [facility];  Service: Cardiovascular;  Laterality: N/A;   MULTIPLE CYSTO/  HYDRODISTENTION/  INSTILLATION THERAPY  last one 08-19-2009   NASAL SINUS SURGERY  1987   TONSILLECTOMY  as child   VAGINAL GROWTH REMOVED  as teen     reports that she has never smoked. She has never used smokeless tobacco. She reports that she does not drink alcohol and does not use drugs.  Allergies  Allergen Reactions   Moxifloxacin Hives, Shortness Of Breath, Swelling and Rash    Rash was severe, caused tremors, ans skin became BLOODY RED   Quinolones Anaphylaxis   Sulfa Antibiotics Hives and Rash   Furosemide Other (See Comments)    CANNOT TAKE DUE TO Interstitial cystitis   Aspirin Nausea Only and Other (See Comments)    GI and stomach upset   Duloxetine Hcl Nausea Only and Other (See Comments)    Vertigo and heart racing also    Hydrochlorothiazide Other (See Comments)    Headaches    Ibuprofen Nausea Only and Other (See Comments)    GI upset   Other Other (See Comments)   Robaxin [Methocarbamol] Other (See Comments)    "Make me feel flu-like symptoms"   Topiramate Itching   Zofran [Ondansetron Hcl] Other (See Comments)    Headaches    Codeine Hives, Nausea And Vomiting and Rash   Dimethyl Sulfoxide Hives and Itching   Macrodantin [Nitrofurantoin Macrocrystal] Itching, Nausea And Vomiting and Rash    Abdominal and chest pain, also- blood-red skin, also   Nitrofurantoin Diarrhea, Itching, Rash and Other (See Comments)    GI/stomach upset, sweating, chest pain, and skin turned bloody red   Tape Rash   Yellow Dye Rash    Family History  Problem Relation  Age of Onset   CAD Father    Asthma Father    Hypertension Father    Alzheimer's disease Father    Emphysema Father    COPD Father    Heart failure Mother    Peripheral vascular disease Mother    Stroke Mother    Hypertension Mother    Colonic polyp Mother    Cancer Brother        melanoma     Prior to Admission medications   Medication Sig Start Date End Date Taking? Authorizing Provider  acetaminophen (TYLENOL) 500 MG tablet Take 500 mg by mouth 2 (two) times daily as needed (for pain).   Yes [provider]  aspirin EC 81 MG tablet Take 1 tablet (81 mg total) by mouth daily. Swallow whole. 11/03/20  Yes Chandrasekhar, Mahesh A, MD  atorvastatin (LIPITOR) 40 MG tablet Take 1 tablet (40 mg total) by mouth daily. 11/03/20  Yes [name], Mahesh  A, MD  dextromethorphan-guaiFENesin (MUCINEX DM) 30-600 MG 12hr tablet Take 1 tablet by mouth every 12 (twelve) hours as needed for cough.   Yes [provider]  fexofenadine (ALLEGRA) 60 MG tablet Take 60 mg by mouth See admin instructions. Take 60 mg by mouth one to two times a day as needed for allergy symptoms 01/06/14  Yes [provider]  hydrALAZINE (APRESOLINE) 50 MG tablet Take 1 tablet (50 mg total) by mouth 3 (three) times daily. 12/09/20  Yes Chandrasekhar, Mahesh A, MD  HYDROcodone-acetaminophen (NORCO) 7.5-325 MG tablet Take 1 tablet by mouth every 8 (eight) hours as needed for moderate pain or severe pain. 12/22/20  Yes [provider]  hydrocortisone (ANUSOL-HC) 25 MG suppository Place 25 mg rectally 2 (two) times daily as needed for hemorrhoids. 12/07/20  Yes [provider]  isosorbide dinitrate (ISORDIL) 10 MG tablet TAKE 1 TABLET BY MOUTH THREE TIMES A DAY 12/19/20  Yes Chandrasekhar, Mahesh A, MD  losartan (COZAAR) 100 MG tablet Take 100 mg by mouth daily.   Yes [provider]  meclizine (ANTIVERT) 25 MG tablet Take 25 mg by mouth 3 (three) times daily as needed for dizziness.    Yes [provider]  nitroGLYCERIN (NITROSTAT) 0.4 MG SL tablet Place 1 tablet (0.4 mg total) under the tongue every 5 (five) minutes as needed for chest pain. 09/27/20  Yes Azucena Fallen, MD  nystatin cream (MYCOSTATIN) Apply topically 2 (two) times daily. 08/04/20  Yes Ginnie Smart, MD  omeprazole (PRILOSEC) 20 MG capsule Take 20 mg by mouth See admin instructions. Take 20 mg by mouth in the morning one hour before breakfast and an additional 20 mg before evening meal as needed for GERD 02/08/14  Yes [provider]  ondansetron (ZOFRAN-ODT) 4 MG disintegrating tablet Take 4 mg by mouth every 8 (eight) hours as needed for nausea (dissolve orally). 07/01/17  Yes [provider]  spironolactone (ALDACTONE) 25 MG tablet Take 1 tablet (25 mg total) by mouth daily. 11/03/20  Yes Chandrasekhar, Mahesh A, MD  sucralfate (CARAFATE) 1 G tablet Take 1 g by mouth See admin instructions. Take 1 gram by mouth in the morning and an additional 1 gram once a day for flares   Yes [provider]  amoxicillin-clavulanate (AUGMENTIN) 500-125 MG tablet Take 500 mg by mouth 3 (three) times daily. 01/09/21   [provider]  fluconazole (DIFLUCAN) 100 MG tablet TAKE 1 TABLET BY MOUTH EVERY DAY Patient not taking: No sig reported 12/27/20   Ginnie Smart, MD  Meth-Hyo-M Bl-Benz Acd-Ph Sal 81.6 MG TABS Take 1 tablet (81.6 mg total) by mouth 2 (two) times daily as needed. Patient not taking: Reported on 01/10/2021 06/07/17   Ginnie Smart, MD  promethazine (PHENERGAN) 25 MG tablet Take 1 tablet (25 mg total) by mouth every 6 (six) hours as needed for refractory nausea / vomiting. Patient not taking: No sig reported 10/20/20   Paula Libra, MD    Physical Exam: Vitals:   01/10/21 1100 01/10/21 1115 01/10/21 1130 01/10/21 1145  BP: (!) 162/80 (!) 180/83 (!) 171/75 (!) 155/81  Pulse: 74 72 76 73  Resp: 15 15 16 15   Temp:      TempSrc:      SpO2: 97% 97% 97% 98%   Weight:      Height:        Constitutional: NAD, calm, comfortable Vitals:   01/10/21 1100 01/10/21 1115 01/10/21 1130 01/10/21 1145  BP: 03/12/21)  162/80 (!) 180/83 (!) 171/75 (!) 155/81  Pulse: 74 72 76 73  Resp: 15 15 16 15   Temp:      TempSrc:      SpO2: 97% 97% 97% 98%  Weight:      Height:       Eyes: PERRL, lids and conjunctivae normal ENMT: Mucous membranes are moist. Posterior pharynx clear of any exudate or lesions.Normal dentition.  Neck: normal, supple, no masses, no thyromegaly Respiratory: clear to auscultation bilaterally, no wheezing, no crackles. Normal respiratory effort. No accessory muscle use.  Cardiovascular: Regular rate and rhythm, no murmurs / rubs / gallops. No extremity edema. 2+ pedal pulses. No carotid bruits.  Abdomen: no tenderness, no masses palpated. No hepatosplenomegaly. Bowel sounds positive.  Musculoskeletal: no clubbing / cyanosis. No joint deformity upper and lower extremities. Good ROM, no contractures. Normal muscle tone.  Skin: no rashes, lesions, ulcers. No induration Neurologic: CN 2-12 grossly intact. Sensation intact, DTR normal. Strength 5/5 in all 4.  Psychiatric: Normal judgment and insight. Alert and oriented x 3. Normal mood.     Labs on Admission: I have personally reviewed following labs and imaging studies  CBC: Recent Labs  Lab 01/09/21 2210  WBC 9.8  HGB 11.8*  HCT 36.8  MCV 88.5  PLT 311   Basic Metabolic Panel: Recent Labs  Lab 01/09/21 2210  NA 139  K 4.0  CL 105  CO2 23  GLUCOSE 117*  BUN 12  CREATININE 0.88  CALCIUM 9.2   GFR: Estimated Creatinine Clearance: 63.6 mL/min (by C-G formula based on SCr of 0.88 mg/dL). Liver Function Tests: Recent Labs  Lab 01/09/21 2210  AST 25  ALT 19  ALKPHOS 62  BILITOT 0.7  PROT 7.0  ALBUMIN 3.9   No results for input(s): LIPASE, AMYLASE in the last 168 hours. No results for input(s): AMMONIA in the last 168 hours. Coagulation Profile: No results for  input(s): INR, PROTIME in the last 168 hours. Cardiac Enzymes: No results for input(s): CKTOTAL, CKMB, CKMBINDEX, TROPONINI in the last 168 hours. BNP (last 3 results) No results for input(s): PROBNP in the last 8760 hours. HbA1C: No results for input(s): HGBA1C in the last 72 hours. CBG: No results for input(s): GLUCAP in the last 168 hours. Lipid Profile: No results for input(s): CHOL, HDL, LDLCALC, TRIG, CHOLHDL, LDLDIRECT in the last 72 hours. Thyroid Function Tests: No results for input(s): TSH, T4TOTAL, FREET4, T3FREE, THYROIDAB in the last 72 hours. Anemia Panel: No results for input(s): VITAMINB12, FOLATE, FERRITIN, TIBC, IRON, RETICCTPCT in the last 72 hours. Urine analysis:    Component Value Date/Time   COLORURINE YELLOW 10/19/2020 2109   APPEARANCEUR CLEAR 10/19/2020 2109   LABSPEC 1.007 10/19/2020 2109   PHURINE 6.5 10/19/2020 2109   GLUCOSEU NEGATIVE 10/19/2020 2109   HGBUR NEGATIVE 10/19/2020 2109   BILIRUBINUR NEGATIVE 10/19/2020 2109   KETONESUR NEGATIVE 10/19/2020 2109   PROTEINUR TRACE (A) 10/19/2020 2109   NITRITE NEGATIVE 10/19/2020 2109   LEUKOCYTESUR SMALL (A) 10/19/2020 2109    Radiological Exams on Admission: DG Chest 2 View  Result Date: 01/09/2021 CLINICAL DATA:  Central chest pain, short of breath, palpitations EXAM: CHEST - 2 VIEW COMPARISON:  10/03/2020 FINDINGS: Frontal and lateral views of the chest demonstrate a stable cardiac silhouette. No acute airspace disease, effusion, or pneumothorax. As it is fissure again incidentally noted. Prominent right convex thoracic scoliosis unchanged. IMPRESSION: 1. No acute intrathoracic process. Electronically Signed   By: 12/04/2020 M.D.   On:  01/09/2021 22:34    EKG: Independently reviewed. Sinus, no acute ST changes  Assessment/Plan Active Problems:   Ischemic chest pain (HCC)   Chest pain  (please populate well all problems here in Problem List. (For example, if patient is on BP meds at home and  you resume or decide to hold them, it is a problem that needs to be her. 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 -I think the key to control her BP and HR, however, she said she did not tolerate metoprolol or Atenolol in her past. D/W cardiology, who will choose between metoprolol vs CCB. -Her cardiology recently D/C her Plavix due to severe GERD. Cardiology plans for starting Xarelto in this case. Continue Imdur. -ASA and statin  HTN, uncontrolled -As above.  Trop elevation. -Trop 56>54, pattern is flat, more likely a demanding ischemic event, ACS less likely. -Cardiology to decide further cardiac workup plan.  Palpitations -Ddx including Hydralazine use, frequent PVCs, will order TSH. -Beta blocker vs CCB. K=4.2, will check Mag and Phos  Recurrent cystitis -Received one dose of Ceftriaxone, resume PO ABX tomorrow.  GERD -PPI and Carafate    DVT prophylaxis: LOvenox Code Status: Full code Family Communication: None at bedside Disposition Plan: Expect less than 2 midnight hospital stay Consults called: Cardiology  Admission status: Tele obs   Emeline General MD [facility] Pager [phone]  01/10/2021, 1:05 PM

## 2021-01-10 NOTE — Progress Notes (Signed)
  Echocardiogram 2D Echocardiogram has been performed.  Roosvelt Maser F 01/10/2021, 5:13 PM

## 2021-01-10 NOTE — ED Provider Notes (Signed)
[facility] EMERGENCY DEPARTMENT Provider Note   CSN: [id number] Arrival date & time: 01/09/21  2058     History Chief Complaint  Patient presents with   Chest Pain    Jennifer [name] is a 75 y.o. female with PMH interstitial cystitis, chronic fatigue syndrome, HTN, known coronary artery disease status post catheterization showing a 90% stenosis in the LAD but managed medically due to arterial tortuosity who presents the emergency department for evaluation of chest pain, palpitations and dysuria.  Patient states that she has been having intermittent chest pain for a long time but over the last 24 hours it is worsened and is associated with a "fluttering" feeling in her chest.  She endorses mild shortness of breath with this as well but it is not exertional.  Currently denies abdominal pain, nausea, vomiting, diarrhea, headache, fever or other systemic symptoms.  She states that she was recently diagnosed with a cystitis but has not started taking the amoxicillin that was prescribed to her.   Chest Pain Associated symptoms: palpitations and shortness of breath   Associated symptoms: no abdominal pain, no back pain, no cough, no fever and no vomiting       Past Medical History:  Diagnosis Date   Acute meniscal tear of left knee    FOLLOWED BY DR GIAFFREY   BPPV (benign paroxysmal positional vertigo)    Chronic bladder pain    Chronic fatigue syndrome    Chronic low back pain    W/ RIGHT LEG PAIN AND NUMBNESS   Cystitis, chronic    Fibromyalgia    GERD (gastroesophageal reflux disease)    Hiatal hernia    Hypertension    IC (interstitial cystitis)    OSA (obstructive sleep apnea)    per pt study yrs ago-- moderate osa ,  intolerant cpap   Pinched vertebral nerve    bilateral L2 -- L3 and L3 -- L4-/  epidural injection's treatment , PT and pain clince   PONV (postoperative nausea and vomiting)    severe   S/P urinary bladder replacement    1984  new  bladder made from colon    Self-catheterizes urinary bladder    Spinal stenosis, lumbar region with neurogenic claudication    Wears glasses    Wears partial dentures    upper and lower    Patient Active Problem List   Diagnosis Date Noted   Hypertensive crisis 09/16/2020   Unstable angina (HCC)    Chest pain 09/15/2020   Hypertensive emergency 09/15/2020   Hypertensive urgency 09/15/2020   Interstitial cystitis (chronic) without hematuria 09/15/2020   Chronic low back pain 09/15/2020   Chronic diastolic CHF (congestive heart failure) (HCC) 09/15/2020   Heart murmur 09/08/2020   Allergic rhinitis 08/17/2020   Altered bowel function 08/17/2020   Anxiety disorder 08/17/2020   Body mass index (BMI) 35.0-35.9, adult 08/17/2020   Chronic fatigue syndrome 08/17/2020   Colon cancer screening 08/17/2020   Functional diarrhea 08/17/2020   Hypothyroidism 08/17/2020   Intestinal gas excretion 08/17/2020   Localized obesity 08/17/2020   Nausea 08/17/2020   Polypharmacy 08/17/2020   Posttraumatic headache 08/17/2020   Posttraumatic vertigo 08/17/2020   Pure hypercholesterolemia 08/17/2020   Lower abdominal pain 08/17/2020   Scoliosis 08/17/2020   Sleep apnea 08/17/2020   Unspecified abnormal finding in specimens from other organs, systems and tissues 08/17/2020   Vitamin D deficiency 08/17/2020   Back pain 04/28/2020   Candidiasis 10/15/2019   Encounter for orthopedic follow-up care  08/12/2018   Vertigo 02/26/2018   Degenerative arthritis of right knee 02/19/2018   Urinary tract infection, site not specified 07/04/2017   Arthralgia of both knees 01/23/2017   Facet arthritis, degenerative, lumbar spine 01/23/2017   Benign paroxysmal positional vertigo 07/03/2016   IBS (irritable bowel syndrome) 04/19/2015   GERD (gastroesophageal reflux disease) 03/21/2015   Fibromyalgia 03/21/2015   Recurrent bacterial cystitis 03/21/2015   HTN (hypertension) 03/21/2015   Fibrositis 03/21/2015    SI (sacroiliac) pain 12/16/2014   DDD (degenerative disc disease), lumbar 05/11/2014   Facet arthropathy 05/11/2014   Spinal stenosis of lumbar region 05/11/2014    Past Surgical History:  Procedure Laterality Date   ABDOMINAL AORTOGRAM N/A 09/16/2020   Procedure: ABDOMINAL AORTOGRAM;  Surgeon: Iran Ouch, MD;  Location: [facility];  Service: Cardiovascular;  Laterality: N/A;   ABDOMINAL HYSTERECTOMY  1980   w/ Left Salpingoophorectomy   CARDIAC CATHETERIZATION  08-21-2001  dr Verdis Prime   normal coronary arteries and LVF   CARPAL TUNNEL RELEASE Bilateral 1986   CECALCYSTOPLASTY/  APPENDECTOMY  1984   at [facility]   CHOLECYSTECTOMY  1992   CYSTO WITH HYDRODISTENSION N/A 01/12/2016   Procedure: CYSTOSCOPY/HYDRODISTENSION;  Surgeon: Jethro Bolus, MD;  Location: [facility];  Service: Urology;  Laterality: N/A;   CYSTO WITH HYDRODISTENSION N/A 08/30/2016   Procedure: CYSTOSCOPY/HYDRODISTENSION OF BLADDER INJECTION OF MARCAINE/PYRIDIUM;  Surgeon: Jethro Bolus, MD;  Location: [facility];  Service: Urology;  Laterality: N/A;   LEFT HEART CATH AND CORONARY ANGIOGRAPHY N/A 09/16/2020   Procedure: LEFT HEART CATH AND CORONARY ANGIOGRAPHY;  Surgeon: Iran Ouch, MD;  Location: [facility];  Service: Cardiovascular;  Laterality: N/A;   MULTIPLE CYSTO/  HYDRODISTENTION/  INSTILLATION THERAPY  last one 08-19-2009   NASAL SINUS SURGERY  1987   TONSILLECTOMY  as child   VAGINAL GROWTH REMOVED  as teen     OB History   No obstetric history on file.     Family History  Problem Relation Age of Onset   CAD Father    Asthma Father    Hypertension Father    Alzheimer's disease Father    Emphysema Father    COPD Father    Heart failure Mother    Peripheral vascular disease Mother    Stroke Mother    Hypertension Mother    Colonic polyp Mother    Cancer Brother        melanoma    Social History   Tobacco Use    Smoking status: Never   Smokeless tobacco: Never  Vaping Use   Vaping Use: Never used  Substance Use Topics   Alcohol use: No    Alcohol/week: 0.0 standard drinks   Drug use: No    Home Medications Prior to Admission medications   Medication Sig Start Date End Date Taking? Authorizing Provider  acetaminophen (TYLENOL) 500 MG tablet Take 500 mg by mouth 2 (two) times daily as needed (for pain).    [provider]  aspirin EC 81 MG tablet Take 1 tablet (81 mg total) by mouth daily. Swallow whole. 11/03/20   Chandrasekhar, [place] Dragon A, MD  atorvastatin (LIPITOR) 40 MG tablet Take 1 tablet (40 mg total) by mouth daily. 11/03/20   Chandrasekhar, Mahesh A, MD  D-Mannose 500 MG CAPS Take 500-1,500 mg by mouth 3 (three) times daily.    [provider]  fexofenadine (ALLEGRA) 60 MG tablet Take 60 mg by mouth See admin instructions.  Take 60 mg by mouth one to two times a day as needed for allergy symptoms 01/06/14   [provider]  fluconazole (DIFLUCAN) 100 MG tablet TAKE 1 TABLET BY MOUTH EVERY DAY 12/27/20   Ginnie Smart, MD  fosfomycin (MONUROL) 3 g PACK Take 3 g by mouth once. 08/10/19   [provider]  hydrALAZINE (APRESOLINE) 50 MG tablet Take 1 tablet (50 mg total) by mouth 3 (three) times daily. 12/09/20   Chandrasekhar, Mahesh A, MD  ipratropium (ATROVENT) 0.06 % nasal spray Place 2 sprays into both nostrils 2 (two) times daily as needed for rhinitis.    [provider]  isosorbide dinitrate (ISORDIL) 10 MG tablet TAKE 1 TABLET BY MOUTH THREE TIMES A DAY 12/19/20   Chandrasekhar, Mahesh A, MD  losartan (COZAAR) 100 MG tablet Take 100 mg by mouth daily.    [provider]  meclizine (ANTIVERT) 25 MG tablet Take 25 mg by mouth 3 (three) times daily as needed for dizziness.    [provider]  Meth-Hyo-M Bl-Benz Acd-Ph Sal 81.6 MG TABS Take 1 tablet (81.6 mg total) by mouth 2 (two) times daily as needed. 06/07/17   Ginnie Smart,  MD  methenamine (HIPREX) 1 G tablet Take by mouth as directed. TAKES WHEN NOT TAKING ANTIBIOTICS    [provider]  nitroGLYCERIN (NITROSTAT) 0.4 MG SL tablet Place 1 tablet (0.4 mg total) under the tongue every 5 (five) minutes as needed for chest pain. 09/27/20   Azucena Fallen, MD  nystatin cream (MYCOSTATIN) Apply topically 2 (two) times daily. 08/04/20   Ginnie Smart, MD  omeprazole (PRILOSEC) 20 MG capsule Take 20 mg by mouth See admin instructions. Take 20 mg by mouth in the morning one hour before breakfast and an additional 20 mg before evening meal as needed for GERD 02/08/14   [provider]  ondansetron (ZOFRAN-ODT) 4 MG disintegrating tablet Take 4 mg by mouth every 8 (eight) hours as needed for nausea (dissolve orally). 07/01/17   [provider]  promethazine (PHENERGAN) 25 MG tablet Take 1 tablet (25 mg total) by mouth every 6 (six) hours as needed for refractory nausea / vomiting. 10/20/20   Molpus, Jonny Ruiz, MD  spironolactone (ALDACTONE) 25 MG tablet Take 1 tablet (25 mg total) by mouth daily. 11/03/20   Chandrasekhar, Mahesh A, MD  sucralfate (CARAFATE) 1 G tablet Take 1 g by mouth See admin instructions. Take 1 gram by mouth in the morning and an additional 1 gram once a day for flares    [provider]  triamcinolone cream (KENALOG) 0.1 % Apply 1 application topically daily as needed (for eczema). 02/03/15   [provider]    Allergies    Moxifloxacin, Quinolones, Sulfa antibiotics, Furosemide, Aspirin, Duloxetine hcl, Hydrochlorothiazide, Ibuprofen, Other, Robaxin [methocarbamol], Topiramate, Zofran [ondansetron hcl], Codeine, Dimethyl sulfoxide, Macrodantin [nitrofurantoin macrocrystal], Nitrofurantoin, Tape, and Yellow dye  Review of Systems   Review of Systems  Constitutional:  Negative for chills and fever.  HENT:  Negative for ear pain and sore throat.   Eyes:  Negative for pain and visual disturbance.  Respiratory:   Positive for shortness of breath. Negative for cough.   Cardiovascular:  Positive for chest pain and palpitations.  Gastrointestinal:  Negative for abdominal pain and vomiting.  Genitourinary:  Negative for dysuria and hematuria.  Musculoskeletal:  Negative for arthralgias and back pain.  Skin:  Negative for color change and rash.  Neurological:  Negative for seizures and syncope.  All other systems reviewed and are negative.  Physical Exam Updated Vital Signs BP (!) 198/86   Pulse (!) 131   Temp 98.5 F (36.9 C) (Oral)   Resp 14   Ht 5\' 4"  (1.626 m)   Wt 95 kg   SpO2 97%   BMI 35.95 kg/m   Physical Exam Vitals and nursing note reviewed.  Constitutional:      General: She is not in acute distress.    Appearance: She is well-developed.  HENT:     Head: Normocephalic and atraumatic.  Eyes:     Conjunctiva/sclera: Conjunctivae normal.  Cardiovascular:     Rate and Rhythm: Normal rate and regular rhythm.     Heart sounds: No murmur heard. Pulmonary:     Effort: Pulmonary effort is normal. No respiratory distress.     Breath sounds: Normal breath sounds.  Abdominal:     Palpations: Abdomen is soft.     Tenderness: There is no abdominal tenderness.  Musculoskeletal:     Cervical back: Neck supple.  Skin:    General: Skin is warm and dry.  Neurological:     Mental Status: She is alert.    ED Results / Procedures / Treatments   Labs (all labs ordered are listed, but only abnormal results are displayed) Labs Reviewed  CBC - Abnormal; Notable for the following components:      Result Value   Hemoglobin 11.8 (*)    All other components within normal limits  COMPREHENSIVE METABOLIC PANEL - Abnormal; Notable for the following components:   Glucose, Bld 117 (*)    All other components within normal limits  TROPONIN I (HIGH SENSITIVITY) - Abnormal; Notable for the following components:   Troponin I (High Sensitivity) 56 (*)    All other components within normal limits   TROPONIN I (HIGH SENSITIVITY) - Abnormal; Notable for the following components:   Troponin I (High Sensitivity) 54 (*)    All other components within normal limits    EKG EKG Interpretation  Date/Time:  Tuesday January 10 2021 07:50:12 EDT Ventricular Rate:  137 PR Interval:  80 QRS Duration: 77 QT Interval:  400 QTC Calculation: 604 R Axis:   -53 Text Interpretation: Sinus tachycardia Left anterior fascicular block Concern for biphasic T waves in anterior leads + inferior leads Prolonged QT interval Confirmed by Adolfo Granieri ([id number]) on 01/10/2021 7:57:19 AM  *Edit*ECG above written a sinus tachycardia, but concern for atrial flutter  Radiology DG Chest 2 View  Result Date: 01/09/2021 CLINICAL DATA:  Central chest pain, short of breath, palpitations EXAM: CHEST - 2 VIEW COMPARISON:  10/03/2020 FINDINGS: Frontal and lateral views of the chest demonstrate a stable cardiac silhouette. No acute airspace disease, effusion, or pneumothorax. As it is fissure again incidentally noted. Prominent right convex thoracic scoliosis unchanged. IMPRESSION: 1. No acute intrathoracic process. Electronically Signed   By: 12/04/2020 M.D.   On: 01/09/2021 22:34    Procedures Procedures   Medications Ordered in ED Medications - No data to display  ED Course  I have reviewed the triage vital signs and the nursing notes.  Pertinent labs & imaging results that were available during my care of the patient were reviewed by me and considered in my medical decision making (see chart for details).    MDM Rules/Calculators/A&P                           Patient seen emergency department for  evaluation of chest pain and shortness of breath with associated palpitations.  Physical exam is large unremarkable.  On review of the patient's EKG, she is primarily in normal sinus rhythm with rates in the 80s, but will have intermittent runs of what appears to be a flutter with biphasic T waves in the inferior  and lateral leads with tachycardia as high as 140.  Patient is symptomatic during these runs and states she can feel the palpitations.  Laboratory evaluation reveals elevated troponin to 56 with delta troponin 54.  Laboratory evaluation otherwise unremarkable outside of an anemia to 11.8.  Chest x-ray unremarkable.  Patient given 5 mg Lopressor which decreased the frequency of these suspected atrial flutter events.  Cardiology was consulted and the recommendations are currently pending.  Patient will require admission for high risk chest pain and symptomatic palpitations.  Cannot rule out ischemia with known coronary disease as the underlying source of the patient's a flutter, but current cystitis may also be a driving factor. Final Clinical Impression(s) / ED Diagnoses Final diagnoses:  None    Rx / DC Orders ED Discharge Orders     None        Omie Ferger, Wyn Forster, MD 01/10/21 1238

## 2021-01-10 NOTE — ED Notes (Signed)
ED TO INPATIENT HANDOFF REPORT  ED Nurse Name and Phone #: Delice Bison, RN  [phone]  S Name/Age/Gender Jennifer [name] 75 y.o. female Room/Bed: 036C/036C  Code Status   Code Status: Full Code  Home/SNF/Other Home Patient oriented to: self, place, time, and situation Is this baseline? Yes   Triage Complete: Triage complete  Chief Complaint Chest pain [R07.9]  Triage Note Patient arrived with EMS from home reports central chest pain with mild SOB and palpitations this afternoon , she received 2 NTG sl by EMS prior to arrival with mild relief , no emesis or diaphoresis .   Allergies Allergies  Allergen Reactions   Moxifloxacin Hives, Shortness Of Breath, Swelling and Rash    Rash was severe, caused tremors, ans skin became BLOODY RED   Quinolones Anaphylaxis   Sulfa Antibiotics Hives and Rash   Furosemide Other (See Comments)    CANNOT TAKE DUE TO Interstitial cystitis   Aspirin Nausea Only and Other (See Comments)    GI and stomach upset   Duloxetine Hcl Nausea Only and Other (See Comments)    Vertigo and heart racing also    Hydrochlorothiazide Other (See Comments)    Headaches    Ibuprofen Nausea Only and Other (See Comments)    GI upset   Other Other (See Comments)   Robaxin [Methocarbamol] Other (See Comments)    "Make me feel flu-like symptoms"   Topiramate Itching   Zofran [Ondansetron Hcl] Other (See Comments)    Headaches    Codeine Hives, Nausea And Vomiting and Rash   Dimethyl Sulfoxide Hives and Itching   Macrodantin [Nitrofurantoin Macrocrystal] Itching, Nausea And Vomiting and Rash    Abdominal and chest pain, also- blood-red skin, also   Nitrofurantoin Diarrhea, Itching, Rash and Other (See Comments)    GI/stomach upset, sweating, chest pain, and skin turned bloody red   Tape Rash   Yellow Dye Rash    Level of Care/Admitting Diagnosis ED Disposition     ED Disposition  Admit   Condition  --   Comment  Hospital Area: [facility]  [[id number]]  Level of Care: Telemetry Medical [104]  May place patient in observation at [facility] or [facility] if equivalent level of care is available:: No  Covid Evaluation: Asymptomatic Screening Protocol (No Symptoms)  Diagnosis: Chest pain [924268]  Admitting Physician: Emeline General [[id number]]  Attending Physician: Emeline General [[id number]]          B Medical/Surgery History Past Medical History:  Diagnosis Date   Acute meniscal tear of left knee    FOLLOWED BY DR GIAFFREY   BPPV (benign paroxysmal positional vertigo)    Chronic bladder pain    Chronic fatigue syndrome    Chronic low back pain    W/ RIGHT LEG PAIN AND NUMBNESS   Cystitis, chronic    Fibromyalgia    GERD (gastroesophageal reflux disease)    Hiatal hernia    Hypertension    IC (interstitial cystitis)    OSA (obstructive sleep apnea)    per pt study yrs ago-- moderate osa ,  intolerant cpap   Pinched vertebral nerve    bilateral L2 -- L3 and L3 -- L4-/  epidural injection's treatment , PT and pain clince   PONV (postoperative nausea and vomiting)    severe   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    Self-catheterizes urinary bladder    Spinal stenosis, lumbar region with  neurogenic claudication    Wears glasses    Wears partial dentures    upper and lower   Past Surgical History:  Procedure Laterality Date   ABDOMINAL AORTOGRAM N/A 09/16/2020   Procedure: ABDOMINAL AORTOGRAM;  Surgeon: Iran Ouch, MD;  Location: [facility];  Service: Cardiovascular;  Laterality: N/A;   ABDOMINAL HYSTERECTOMY  1980   w/ Left Salpingoophorectomy   CARDIAC CATHETERIZATION  08-21-2001  dr Verdis Prime   normal coronary arteries and LVF   CARPAL TUNNEL RELEASE Bilateral 1986   CECALCYSTOPLASTY/  APPENDECTOMY  1984   at [facility]   CHOLECYSTECTOMY  1992   CYSTO WITH HYDRODISTENSION N/A 01/12/2016   Procedure: CYSTOSCOPY/HYDRODISTENSION;  Surgeon: Jethro Bolus,  MD;  Location: [facility];  Service: Urology;  Laterality: N/A;   CYSTO WITH HYDRODISTENSION N/A 08/30/2016   Procedure: CYSTOSCOPY/HYDRODISTENSION OF BLADDER INJECTION OF MARCAINE/PYRIDIUM;  Surgeon: Jethro Bolus, MD;  Location: [facility];  Service: Urology;  Laterality: N/A;   LEFT HEART CATH AND CORONARY ANGIOGRAPHY N/A 09/16/2020   Procedure: LEFT HEART CATH AND CORONARY ANGIOGRAPHY;  Surgeon: Iran Ouch, MD;  Location: [facility];  Service: Cardiovascular;  Laterality: N/A;   MULTIPLE CYSTO/  HYDRODISTENTION/  INSTILLATION THERAPY  last one 08-19-2009   NASAL SINUS SURGERY  1987   TONSILLECTOMY  as child   VAGINAL GROWTH REMOVED  as teen     A IV Location/Drains/Wounds Patient Lines/Drains/Airways Status     Active Line/Drains/Airways     Name Placement date Placement time Site Days   Peripheral IV 01/10/21 20 G Anterior;Distal;Left;Upper Arm 01/10/21  0750  Arm  less than 1            Intake/Output Last 24 hours No intake or output data in the 24 hours ending 01/10/21 1616  Labs/Imaging Results for orders placed or performed during the hospital encounter of 01/09/21 (from the past 48 hour(s))  CBC     Status: Abnormal   Collection Time: 01/09/21 10:10 PM  Result Value Ref Range   WBC 9.8 4.0 - 10.5 K/uL   RBC 4.16 3.87 - 5.11 MIL/uL   Hemoglobin 11.8 (L) 12.0 - 15.0 g/dL   HCT 31.5 40.0 - 86.7 %   MCV 88.5 80.0 - 100.0 fL   MCH 28.4 26.0 - 34.0 pg   MCHC 32.1 30.0 - 36.0 g/dL   RDW 61.9 50.9 - 32.6 %   Platelets 311 150 - 400 K/uL   nRBC 0.0 0.0 - 0.2 %    Comment: Performed at [facility], [address], [place], [place] [address]  Troponin I (High Sensitivity)     Status: Abnormal   Collection Time: 01/09/21 10:10 PM  Result Value Ref Range   Troponin I (High Sensitivity) 56 (H) <18 ng/L    Comment: (NOTE) Elevated high sensitivity troponin I (hsTnI) values and significant  changes across serial  measurements may suggest ACS but many other  chronic and acute conditions are known to elevate hsTnI results.  Refer to the "Links" section for chest pain algorithms and additional  guidance. Performed at [facility], [address] [place], [place] [address]   Comprehensive metabolic panel     Status: Abnormal   Collection Time: 01/09/21 10:10 PM  Result Value Ref Range   Sodium 139 135 - 145 mmol/L   Potassium 4.0 3.5 - 5.1 mmol/L   Chloride 105 98 - 111 mmol/L   CO2 23 22 -  32 mmol/L   Glucose, Bld 117 (H) 70 - 99 mg/dL    Comment: Glucose reference range applies only to samples taken after fasting for at least 8 hours.   BUN 12 8 - 23 mg/dL   Creatinine, Ser 6.04 0.44 - 1.00 mg/dL   Calcium 9.2 8.9 - 54.0 mg/dL   Total Protein 7.0 6.5 - 8.1 g/dL   Albumin 3.9 3.5 - 5.0 g/dL   AST 25 15 - 41 U/L   ALT 19 0 - 44 U/L   Alkaline Phosphatase 62 38 - 126 U/L   Total Bilirubin 0.7 0.3 - 1.2 mg/dL   GFR, Estimated >98 >11 mL/min    Comment: (NOTE) Calculated using the CKD-EPI Creatinine Equation (2021)    Anion gap 11 5 - 15    Comment: Performed at [facility], [address], [place], [place] [address]  Troponin I (High Sensitivity)     Status: Abnormal   Collection Time: 01/10/21 12:32 AM  Result Value Ref Range   Troponin I (High Sensitivity) 54 (H) <18 ng/L    Comment: (NOTE) Elevated high sensitivity troponin I (hsTnI) values and significant  changes across serial measurements may suggest ACS but many other  chronic and acute conditions are known to elevate hsTnI results.  Refer to the "Links" section for chest pain algorithms and additional  guidance. Performed at [facility], [address], [place], [place] [address]   Urinalysis, Routine w reflex microscopic Urine, Clean Catch     Status: Abnormal   Collection Time: 01/10/21 12:00 PM  Result Value Ref Range   Color, Urine YELLOW YELLOW   APPearance CLOUDY (A) CLEAR   Specific Gravity, Urine  1.016 1.005 - 1.030   pH 7.0 5.0 - 8.0   Glucose, UA NEGATIVE NEGATIVE mg/dL   Hgb urine dipstick NEGATIVE NEGATIVE   Bilirubin Urine NEGATIVE NEGATIVE   Ketones, ur NEGATIVE NEGATIVE mg/dL   Protein, ur NEGATIVE NEGATIVE mg/dL   Nitrite POSITIVE (A) NEGATIVE   Leukocytes,Ua LARGE (A) NEGATIVE   RBC / HPF 6-10 0 - 5 RBC/hpf   WBC, UA >50 (H) 0 - 5 WBC/hpf   Bacteria, UA MANY (A) NONE SEEN   Squamous Epithelial / LPF 11-20 0 - 5   Non Squamous Epithelial 0-5 (A) NONE SEEN    Comment: Performed at [facility], [address], [place], [place] [address]   DG Chest 2 View  Result Date: 01/09/2021 CLINICAL DATA:  Central chest pain, short of breath, palpitations EXAM: CHEST - 2 VIEW COMPARISON:  10/03/2020 FINDINGS: Frontal and lateral views of the chest demonstrate a stable cardiac silhouette. No acute airspace disease, effusion, or pneumothorax. As it is fissure again incidentally noted. Prominent right convex thoracic scoliosis unchanged. IMPRESSION: 1. No acute intrathoracic process. Electronically Signed   By: Sharlet Salina M.D.   On: 01/09/2021 22:34    Pending Labs Unresulted Labs (From admission, onward)     Start     Ordered   01/11/21 0500  Lipid panel  Tomorrow morning,   R        01/10/21 1303   01/10/21 1340  Magnesium  Add-on,   AD        01/10/21 1339   01/10/21 1340  Phosphorus  Add-on,   AD        01/10/21 1339   01/10/21 1307  TSH  Add-on,   AD        01/10/21 1306  Vitals/Pain Today's Vitals   01/10/21 1315 01/10/21 1330 01/10/21 1425 01/10/21 1515  BP: (!) 196/87 (!) 179/94 (!) 193/93 (!) 174/83  Pulse: 78 84 79   Resp: 15 17    Temp:      TempSrc:      SpO2: 97% 97%    Weight:      Height:      PainSc:        Isolation Precautions No active isolations  Medications Medications  nitroGLYCERIN (NITROSTAT) SL tablet 0.4 mg (0.4 mg Sublingual Given 01/10/21 1039)  aspirin EC tablet 81 mg (81 mg Oral Given 01/10/21 1356)   HYDROcodone-acetaminophen (NORCO) 7.5-325 MG per tablet 1 tablet (has no administration in time range)  amoxicillin-clavulanate (AUGMENTIN) 500-125 MG per tablet 500 mg (has no administration in time range)  atorvastatin (LIPITOR) tablet 40 mg (40 mg Oral Given 01/10/21 1356)  hydrALAZINE (APRESOLINE) tablet 50 mg (50 mg Oral Given 01/10/21 1515)  isosorbide dinitrate (ISORDIL) tablet 10 mg (10 mg Oral Given 01/10/21 1515)  losartan (COZAAR) tablet 100 mg (100 mg Oral Given 01/10/21 1357)  spironolactone (ALDACTONE) tablet 25 mg (25 mg Oral Given 01/10/21 1508)  meclizine (ANTIVERT) tablet 25 mg (has no administration in time range)  pantoprazole (PROTONIX) EC tablet 40 mg (40 mg Oral Given 01/10/21 1356)  ondansetron (ZOFRAN-ODT) disintegrating tablet 4 mg (has no administration in time range)  sucralfate (CARAFATE) tablet 1 g (has no administration in time range)  dextromethorphan-guaiFENesin (MUCINEX DM) 30-600 MG per 12 hr tablet 1 tablet (has no administration in time range)  loratadine (CLARITIN) tablet 10 mg (10 mg Oral Given 01/10/21 1356)  hydrocortisone (ANUSOL-HC) suppository 25 mg (has no administration in time range)  nystatin cream (MYCOSTATIN) (has no administration in time range)  acetaminophen (TYLENOL) tablet 650 mg (has no administration in time range)  enoxaparin (LOVENOX) injection 40 mg (40 mg Subcutaneous Given 01/10/21 1402)  metoprolol tartrate (LOPRESSOR) tablet 25 mg (25 mg Oral Given 01/10/21 1425)  metoprolol tartrate (LOPRESSOR) injection 5 mg (5 mg Intravenous Given 01/10/21 0815)  cefTRIAXone (ROCEPHIN) 2 g in sodium chloride 0.9 % 100 mL IVPB (0 g Intravenous Stopped 01/10/21 0856)    Mobility walks Low fall risk   Focused Assessments Cardiac Assessment Handoff:  Cardiac Rhythm: Sinus tachycardia No results found for: CKTOTAL, CKMB, CKMBINDEX, TROPONINI No results found for: DDIMER Does the Patient currently have chest pain? No     R Recommendations: See Admitting Provider Note  Report given to:   Additional Notes:

## 2021-01-10 NOTE — Consult Note (Signed)
Cardiology Consultation:   Patient ID: SAVVY PEETERS MRN: [id number]; DOB: [birth year]  Admit date: 01/09/2021 Date of Consult: 01/10/2021  PCP:  Jarrett Soho, PA-C   [facility] Providers Cardiologist:  Christell Constant, MD    Patient Profile:   Jennifer [name] is a 75 y.o. female with a hx of hypertension, PVCs, hyperlipidemia, chronic pain, CAD in the LAD not amendable to PCI who is being seen 01/10/2021 for the evaluation of chest pain/tachycardia at the request of Dr Posey Rea.  History of Present Illness:   [name] is a 75 year old female with past medical history noted above.  She has been followed by Dr. [name] as an outpatient.  She presented to the ED 08/2020 with chest pain and hypertensive urgency.  Underwent cardiac catheterization with Dr. Katrinka Blazing 09/16/2020 noting significant one-vessel coronary disease involving the mid to distal LAD of 90% at the bifurcation of third diagonal which was 60% stenosed.  Unfortunately the LAD stenosis was very torturous in nature and also at the origin of the third diagonal branch which was noted to have ostial disease.  It was felt that PCI of that area would be high risk for dissection as well as sidebranch closure especially in the setting of uncontrolled hypertension.  Recommended that she optimized with medical therapy.  Echocardiogram showed an EF of 60 to 65% with no regional wall motion abnormality, moderate LVH, grade 1 diastolic dysfunction.  She was placed on Plavix post cath, along with Ranexa, Isordil, losartan, atorvastatin, spironolactone.  Her beta-blocker was stopped in setting of sinus pauses.  She was last seen in the office on 8/4 and reported doing terribly.  Noted that since being started on Plavix she had had nausea, constipation and dark stools.  She is also noted to have multiple medication allergies.  It was initially noted she had an intolerance to aspirin, but she denied this.  Her Plavix was stopped  and started on 81 mg aspirin daily, continued on atorvastatin 40 mg daily, Isordil 10 mg 3 times daily, losartan 100 mg daily, spironolactone 25 mg daily and Ranexa 500 mg twice daily.  Planned referral to hypertension clinic with follow-up echo.   Presented to the ED on 10/10 with palpitations and feeling of "pounding" in her chest for the past several days. Also with episodes of chest heaviness that seem to be associated with these episodes. She has been following in the HTN clinic as well with multiple changes made to her BP meds over the past couple of weeks. She initially thought her symptoms were related to hydralazine but when things worsened yesterday afternoon she presented to the ED.   Labs on admission showed stable electrolytes, creatinine 0.8, high-sensitivity troponin 56>>54, WBC 9.8, hemoglobin 11.8.  Chest x-ray negative.  EKG interpretation reads as sinus tachycardia, but appears more consistent with atrial flutter, 137 bpm. In review of telemetry, SR with freq brief episodes of atrial flutter. Flutter rates in the 120-130s when noted.    Past Medical History:  Diagnosis Date   Acute meniscal tear of left knee    FOLLOWED BY DR GIAFFREY   BPPV (benign paroxysmal positional vertigo)    Chronic bladder pain    Chronic fatigue syndrome    Chronic low back pain    W/ RIGHT LEG PAIN AND NUMBNESS   Cystitis, chronic    Fibromyalgia    GERD (gastroesophageal reflux disease)    Hiatal hernia    Hypertension    IC (interstitial cystitis)  OSA (obstructive sleep apnea)    per pt study yrs ago-- moderate osa ,  intolerant cpap   Pinched vertebral nerve    bilateral L2 -- L3 and L3 -- L4-/  epidural injection's treatment , PT and pain clince   PONV (postoperative nausea and vomiting)    severe   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    Self-catheterizes urinary bladder    Spinal stenosis, lumbar region with neurogenic claudication    Wears glasses     Wears partial dentures    upper and lower    Past Surgical History:  Procedure Laterality Date   ABDOMINAL AORTOGRAM N/A 09/16/2020   Procedure: ABDOMINAL AORTOGRAM;  Surgeon: Iran Ouch, MD;  Location: [facility];  Service: Cardiovascular;  Laterality: N/A;   ABDOMINAL HYSTERECTOMY  1980   w/ Left Salpingoophorectomy   CARDIAC CATHETERIZATION  08-21-2001  dr Verdis Prime   normal coronary arteries and LVF   CARPAL TUNNEL RELEASE Bilateral 1986   CECALCYSTOPLASTY/  APPENDECTOMY  1984   at [facility]   CHOLECYSTECTOMY  1992   CYSTO WITH HYDRODISTENSION N/A 01/12/2016   Procedure: CYSTOSCOPY/HYDRODISTENSION;  Surgeon: Jethro Bolus, MD;  Location: [facility];  Service: Urology;  Laterality: N/A;   CYSTO WITH HYDRODISTENSION N/A 08/30/2016   Procedure: CYSTOSCOPY/HYDRODISTENSION OF BLADDER INJECTION OF MARCAINE/PYRIDIUM;  Surgeon: Jethro Bolus, MD;  Location: [facility];  Service: Urology;  Laterality: N/A;   LEFT HEART CATH AND CORONARY ANGIOGRAPHY N/A 09/16/2020   Procedure: LEFT HEART CATH AND CORONARY ANGIOGRAPHY;  Surgeon: Iran Ouch, MD;  Location: [facility];  Service: Cardiovascular;  Laterality: N/A;   MULTIPLE CYSTO/  HYDRODISTENTION/  INSTILLATION THERAPY  last one 08-19-2009   NASAL SINUS SURGERY  1987   TONSILLECTOMY  as child   VAGINAL GROWTH REMOVED  as teen     Home Medications:  Prior to Admission medications   Medication Sig Start Date End Date Taking? Authorizing Provider  acetaminophen (TYLENOL) 500 MG tablet Take 500 mg by mouth 2 (two) times daily as needed (for pain).   Yes [provider]  aspirin EC 81 MG tablet Take 1 tablet (81 mg total) by mouth daily. Swallow whole. 11/03/20  Yes Chandrasekhar, Mahesh A, MD  atorvastatin (LIPITOR) 40 MG tablet Take 1 tablet (40 mg total) by mouth daily. 11/03/20  Yes Chandrasekhar, Mahesh A, MD  dextromethorphan-guaiFENesin (MUCINEX DM) 30-600  MG 12hr tablet Take 1 tablet by mouth every 12 (twelve) hours as needed for cough.   Yes [provider]  fexofenadine (ALLEGRA) 60 MG tablet Take 60 mg by mouth See admin instructions. Take 60 mg by mouth one to two times a day as needed for allergy symptoms 01/06/14  Yes [provider]  hydrALAZINE (APRESOLINE) 50 MG tablet Take 1 tablet (50 mg total) by mouth 3 (three) times daily. 12/09/20  Yes Chandrasekhar, Mahesh A, MD  HYDROcodone-acetaminophen (NORCO) 7.5-325 MG tablet Take 1 tablet by mouth every 8 (eight) hours as needed for moderate pain or severe pain. 12/22/20  Yes [provider]  hydrocortisone (ANUSOL-HC) 25 MG suppository Place 25 mg rectally 2 (two) times daily as needed for hemorrhoids. 12/07/20  Yes [provider]  isosorbide dinitrate (ISORDIL) 10 MG tablet TAKE 1 TABLET BY MOUTH THREE TIMES A DAY 12/19/20  Yes Chandrasekhar, Mahesh A, MD  losartan (COZAAR) 100 MG tablet Take 100 mg by mouth daily.   Yes  [provider]  meclizine (ANTIVERT) 25 MG tablet Take 25 mg by mouth 3 (three) times daily as needed for dizziness.   Yes [provider]  nitroGLYCERIN (NITROSTAT) 0.4 MG SL tablet Place 1 tablet (0.4 mg total) under the tongue every 5 (five) minutes as needed for chest pain. 09/27/20  Yes Azucena Fallen, MD  nystatin cream (MYCOSTATIN) Apply topically 2 (two) times daily. 08/04/20  Yes Ginnie Smart, MD  omeprazole (PRILOSEC) 20 MG capsule Take 20 mg by mouth See admin instructions. Take 20 mg by mouth in the morning one hour before breakfast and an additional 20 mg before evening meal as needed for GERD 02/08/14  Yes [provider]  ondansetron (ZOFRAN-ODT) 4 MG disintegrating tablet Take 4 mg by mouth every 8 (eight) hours as needed for nausea (dissolve orally). 07/01/17  Yes [provider]  spironolactone (ALDACTONE) 25 MG tablet Take 1 tablet (25 mg total) by mouth daily. 11/03/20  Yes Chandrasekhar,  Mahesh A, MD  sucralfate (CARAFATE) 1 G tablet Take 1 g by mouth See admin instructions. Take 1 gram by mouth in the morning and an additional 1 gram once a day for flares   Yes [provider]  amoxicillin-clavulanate (AUGMENTIN) 500-125 MG tablet Take 500 mg by mouth 3 (three) times daily. 01/09/21   [provider]  fluconazole (DIFLUCAN) 100 MG tablet TAKE 1 TABLET BY MOUTH EVERY DAY Patient not taking: No sig reported 12/27/20   Ginnie Smart, MD  Meth-Hyo-M Bl-Benz Acd-Ph Sal 81.6 MG TABS Take 1 tablet (81.6 mg total) by mouth 2 (two) times daily as needed. Patient not taking: Reported on 01/10/2021 06/07/17   Ginnie Smart, MD  promethazine (PHENERGAN) 25 MG tablet Take 1 tablet (25 mg total) by mouth every 6 (six) hours as needed for refractory nausea / vomiting. Patient not taking: No sig reported 10/20/20   Molpus, John, MD    Inpatient Medications: Scheduled Meds:  [START ON 01/11/2021] amoxicillin-clavulanate  500 mg Oral TID   aspirin EC  81 mg Oral Daily   atorvastatin  40 mg Oral Daily   enoxaparin (LOVENOX) injection  40 mg Subcutaneous Q24H   hydrALAZINE  50 mg Oral TID   isosorbide dinitrate  10 mg Oral TID   loratadine  10 mg Oral Daily   losartan  100 mg Oral Daily   metoprolol tartrate  25 mg Oral BID   nystatin cream   Topical BID   pantoprazole  40 mg Oral Daily   spironolactone  25 mg Oral Daily   Continuous Infusions:  PRN Meds: acetaminophen, dextromethorphan-guaiFENesin, HYDROcodone-acetaminophen, hydrocortisone, meclizine, nitroGLYCERIN, ondansetron, sucralfate  Allergies:    Allergies  Allergen Reactions   Moxifloxacin Hives, Shortness Of Breath, Swelling and Rash    Rash was severe, caused tremors, ans skin became BLOODY RED   Quinolones Anaphylaxis   Sulfa Antibiotics Hives and Rash   Furosemide Other (See Comments)    CANNOT TAKE DUE TO Interstitial cystitis   Aspirin Nausea Only and Other (See Comments)    GI and  stomach upset   Duloxetine Hcl Nausea Only and Other (See Comments)    Vertigo and heart racing also    Hydrochlorothiazide Other (See Comments)    Headaches    Ibuprofen Nausea Only and Other (See Comments)    GI upset   Other Other (See Comments)   Robaxin [Methocarbamol] Other (See Comments)    "Make me feel flu-like symptoms"   Topiramate Itching  Zofran [Ondansetron Hcl] Other (See Comments)    Headaches    Codeine Hives, Nausea And Vomiting and Rash   Dimethyl Sulfoxide Hives and Itching   Macrodantin [Nitrofurantoin Macrocrystal] Itching, Nausea And Vomiting and Rash    Abdominal and chest pain, also- blood-red skin, also   Nitrofurantoin Diarrhea, Itching, Rash and Other (See Comments)    GI/stomach upset, sweating, chest pain, and skin turned bloody red   Tape Rash   Yellow Dye Rash    Social History:   Social History   Socioeconomic History   Marital status: Single    Spouse name: Not on file   Number of children: 0   Years of education: College   Highest education level: Not on file  Occupational History   Occupation: n/a  Tobacco Use   Smoking status: Never   Smokeless tobacco: Never  Vaping Use   Vaping Use: Never used  Substance and Sexual Activity   Alcohol use: No    Alcohol/week: 0.0 standard drinks   Drug use: No   Sexual activity: Not on file  Other Topics Concern   Not on file  Social History Narrative   Lives alone   Caffeine use: 2 glasses tea/day   Social Determinants of Health   Financial Resource Strain: Not on file  Food Insecurity: Not on file  Transportation Needs: Unmet Transportation Needs   Lack of Transportation (Medical): Yes   Lack of Transportation (Non-Medical): Yes  Physical Activity: Not on file  Stress: Not on file  Social Connections: Not on file  Intimate Partner Violence: Not on file    Family History:    Family History  Problem Relation Age of Onset   CAD Father    Asthma Father    Hypertension Father     Alzheimer's disease Father    Emphysema Father    COPD Father    Heart failure Mother    Peripheral vascular disease Mother    Stroke Mother    Hypertension Mother    Colonic polyp Mother    Cancer Brother        melanoma     ROS:  Please see the history of present illness.   All other ROS reviewed and negative.     Physical Exam/Data:   Vitals:   01/10/21 1215 01/10/21 1230 01/10/21 1245 01/10/21 1300  BP: (!) 169/82 (!) 152/85 (!) 188/89 (!) 187/94  Pulse: 81 76 79 80  Resp: 20 17 13 20   Temp:      TempSrc:      SpO2: 98% 96% 98% 97%  Weight:      Height:       No intake or output data in the 24 hours ending 01/10/21 1345 Last 3 Weights 01/09/2021 11/03/2020 10/19/2020  Weight (lbs) 209 lb 7 oz 191 lb 194 lb 8.9 oz  Weight (kg) 95 kg 86.637 kg 88.25 kg     Body mass index is 35.95 kg/m.  General:  Well nourished, well developed, in no acute distress HEENT: normal Neck: no JVD Vascular: No carotid bruits; Distal pulses 2+ bilaterally Cardiac:  normal S1, S2; RRR (tachy intermittently); 2/6 systolic murmur  Lungs:  clear to auscultation bilaterally, no wheezing, rhonchi or rales  Abd: soft, nontender, no hepatomegaly  Ext: no edema Musculoskeletal:  No deformities, BUE and BLE strength normal and equal Skin: warm and dry  Neuro:  CNs 2-12 intact, no focal abnormalities noted Psych:  Normal affect   EKG:  The EKG was personally  reviewed and demonstrates:  Atrial flutter 137 bpm Telemetry:  Telemetry was personally reviewed and demonstrates:  SR with freq but brief bouts of atrial flutter  Relevant CV Studies:  Echo: 08/2020  IMPRESSIONS     1. Left ventricular ejection fraction, by estimation, is 60 to 65%. The  left ventricle has normal function. The left ventricle has no regional  wall motion abnormalities. There is moderate left ventricular hypertrophy.  Left ventricular diastolic  parameters are consistent with Grade I diastolic dysfunction (impaired   relaxation). Elevated left atrial pressure. The average left ventricular  global longitudinal strain is -20.4 %. The global longitudinal strain is  normal.   2. Right ventricular systolic function is normal. The right ventricular  size is normal.   3. The mitral valve is normal in structure. Trivial mitral valve  regurgitation. No evidence of mitral stenosis.   4. The aortic valve is tricuspid. Aortic valve regurgitation is not  visualized. Mild aortic valve stenosis.   5. Aortic dilatation noted. There is mild dilatation of the ascending  aorta, measuring 43 mm.   6. The inferior vena cava is normal in size with greater than 50%  respiratory variability, suggesting right atrial pressure of 3 mmHg.   Comparison(s): 07/11/17 EF 60-65%. PA pressure .   FINDINGS   Left Ventricle: Left ventricular ejection fraction, by estimation, is 60  to 65%. The left ventricle has normal function. The left ventricle has no  regional wall motion abnormalities. The average left ventricular global  longitudinal strain is -20.4 %.  The global longitudinal strain is normal. The left ventricular internal  cavity size was normal in size. There is moderate left ventricular  hypertrophy. Left ventricular diastolic parameters are consistent with  Grade I diastolic dysfunction (impaired  relaxation). Elevated left atrial pressure.   Right Ventricle: The right ventricular size is normal. Right ventricular  systolic function is normal.   Left Atrium: Left atrial size was normal in size.   Right Atrium: Right atrial size was normal in size.   Pericardium: There is no evidence of pericardial effusion.   Mitral Valve: The mitral valve is normal in structure. Trivial mitral  valve regurgitation. No evidence of mitral valve stenosis.   Tricuspid Valve: The tricuspid valve is normal in structure. Tricuspid  valve regurgitation is mild . No evidence of tricuspid stenosis.   Aortic Valve: The aortic valve  is tricuspid. Aortic valve regurgitation is  not visualized. Mild aortic stenosis is present. Aortic valve mean  gradient measures 10.7 mmHg. Aortic valve peak gradient measures 20.0  mmHg. Aortic valve area, by VTI measures  2.50 cm.   Pulmonic Valve: The pulmonic valve was normal in structure. Pulmonic valve  regurgitation is trivial. No evidence of pulmonic stenosis.   Aorta: Aortic dilatation noted. There is mild dilatation of the ascending  aorta, measuring 43 mm.   Venous: The inferior vena cava is normal in size with greater than 50%  respiratory variability, suggesting right atrial pressure of 3 mmHg.   IAS/Shunts: No atrial level shunt detected by color flow Doppler. ]  Cath: 08/2020  Mid LAD lesion is 50% stenosed. 3rd Diag lesion is 60% stenosed. Dist LAD-1 lesion is 90% stenosed. Dist LAD-2 lesion is 40% stenosed.   1.  Significant one-vessel coronary artery disease involving the mid to distal LAD at the bifurcation of the third diagonal which has also 60% ostial stenosis.  The coronary arteries are very tortuous suggestive of hypertensive heart disease. 2.  Left ventricular angiography  was not performed.  EF was normal by echo. 3.  Severely elevated blood pressure with mildly elevated left ventricular end-diastolic pressure. 4.  Transient left bundle branch block after engaging the left ventricle.  This resolved by the end of the case. 5.  Abdominal aortogram was performed and showed no evidence of renal artery stenosis.   Recommendations: Unfortunately, the LAD stenosis is in a very tortuous segment and at the origin of the third diagonal branch which also has ostial disease.  PCI in this area will be high risk for dissection as well as sidebranch closure especially in the setting of uncontrolled hypertension.  In addition, the distal LAD after the stenosis is diffusely diseased. I recommend maximizing medical therapy and controlling blood pressure before considering  PCI.  I added amlodipine to her antihypertensive medications. Other complicating issue is that the patient cannot tolerate aspirin long-term.  If we decide to proceed with PCI at some point, we will have to make sure she tolerates ticagrelor which can likely be used as monotherapy.  Diagnostic Dominance: Right   Laboratory Data:  High Sensitivity Troponin:   Recent Labs  Lab 01/09/21 2210 01/10/21 0032  TROPONINIHS 56* 54*     Chemistry Recent Labs  Lab 01/09/21 2210  NA 139  K 4.0  CL 105  CO2 23  GLUCOSE 117*  BUN 12  CREATININE 0.88  CALCIUM 9.2  GFRNONAA >60  ANIONGAP 11    Recent Labs  Lab 01/09/21 2210  PROT 7.0  ALBUMIN 3.9  AST 25  ALT 19  ALKPHOS 62  BILITOT 0.7   Lipids No results for input(s): CHOL, TRIG, HDL, LABVLDL, LDLCALC, CHOLHDL in the last 168 hours.  Hematology Recent Labs  Lab 01/09/21 2210  WBC 9.8  RBC 4.16  HGB 11.8*  HCT 36.8  MCV 88.5  MCH 28.4  MCHC 32.1  RDW 14.8  PLT 311   Thyroid No results for input(s): TSH, FREET4 in the last 168 hours.  BNPNo results for input(s): BNP, PROBNP in the last 168 hours.  DDimer No results for input(s): DDIMER in the last 168 hours.   Radiology/Studies:  DG Chest 2 View  Result Date: 01/09/2021 CLINICAL DATA:  Central chest pain, short of breath, palpitations EXAM: CHEST - 2 VIEW COMPARISON:  10/03/2020 FINDINGS: Frontal and lateral views of the chest demonstrate a stable cardiac silhouette. No acute airspace disease, effusion, or pneumothorax. As it is fissure again incidentally noted. Prominent right convex thoracic scoliosis unchanged. IMPRESSION: 1. No acute intrathoracic process. Electronically Signed   By: Sharlet Salina M.D.   On: 01/09/2021 22:34     Assessment and Plan:   Jennifer [name] is a 75 y.o. female with a hx of hypertension, PVCs, hyperlipidemia, chronic pain, CAD in the LAD not amendable to PCI who is being seen 01/10/2021 for the evaluation of chest pain/tachycardia  at the request of Dr Posey Rea.  New onset atrial flutter: has bene having episodes of fluttering and pounding sensation in her chest.. EKG on arrival shows new onset atrial flutter with RVR. On telemetry she is in and out of flutter with RVR rates in the 120-130 range. Multiple medication allergies/intolerance in the past. Long discussion regarding BB therapy which she is agreeable too -- start metoprolol 25mg  BID -- reviewed the need for [facility] for stroke risk reduction, she is agreeable. Plan for Xarelto -- check echo, ensure no LV dysfunction in the setting of tachycardia -- check TSH  Elevated troponin/CAD with disease in LAD  not amenable to PCI: hsTn in the 50s x2. Symptoms do not really sound anginal in nature. With low flat troponin, would suspect this is demand ischemia in the setting of tachycardia. If issue with LAD would expect to see much high enzyme leak -- on ASA, statin  Murmur: notable systolic murmur on exam. Echo from 6/22 without significant AS.  -- will recheck echo  HTN: blood pressures are elevated in the ED. She has been following with the PharmD in the office with multiple adjustments to her medications recently 2/2 intolerance. Says her BP is much better at home. Ok at the last office visit.  -- as above will add metoprolol, may need to pull back on other home medications to allow room for BB if needed once BP settles.  HLD: on statin   GERD: on PPI  Risk Assessment/Risk Scores:   CHA2DS2-VASc Score = 4  This indicates a 4.8% annual risk of stroke. The patient's score is based upon: CHF History: 0 HTN History: 1 Diabetes History: 0 Stroke History: 0 Vascular Disease History: 1 Age Score: 1 Gender Score: 1   For questions or updates, please contact [facility] Please consult [email or web address] for contact info under    Signed, Laverda Page, NP  01/10/2021 1:45 PM

## 2021-01-10 NOTE — ED Notes (Signed)
Pt's heart rate is between 80-high 150s. ECG done and given to Dr. Posey Rea. Pt seems anxious as she was talking very fast. This RN reassured pt that she is in the right place. Lights dimmed.

## 2021-01-10 NOTE — ED Notes (Signed)
Dr Kommor at bedside 

## 2021-01-10 NOTE — Patient Outreach (Signed)
[facility]) Care Management  01/10/2021  AKSHITA ITALIANO April 22, 1945 [id number]  Nurse health coach opened patient's chart to make an initial [facility] outreach call to patient. Once opened, nurse discovered that the patient is currently in the ED and is under observation status. Nurse will continue to monitor the patient's status. If she discharges from ED observation status this nurse will make a follow up call to assist the patient. If the patient is admitted to the hospital this nurse health coach will notify the [facility] hospital liaison CM of her admission and the patient will be referred to a [facility] CM.   Plan: Nurse will continue to monitor the patient's ED status and will ensure that the appropriate [facility] follow up occurs.   Blanchie Serve RN, Mining engineer [facility] [phone] [email or web address]

## 2021-01-11 ENCOUNTER — Other Ambulatory Visit: Payer: Self-pay | Admitting: *Deleted

## 2021-01-11 ENCOUNTER — Encounter (HOSPITAL_COMMUNITY): Payer: Self-pay | Admitting: Internal Medicine

## 2021-01-11 DIAGNOSIS — R0789 Other chest pain: Secondary | ICD-10-CM | POA: Diagnosis not present

## 2021-01-11 DIAGNOSIS — I4892 Unspecified atrial flutter: Secondary | ICD-10-CM

## 2021-01-11 DIAGNOSIS — I483 Typical atrial flutter: Secondary | ICD-10-CM | POA: Diagnosis not present

## 2021-01-11 DIAGNOSIS — I2510 Atherosclerotic heart disease of native coronary artery without angina pectoris: Secondary | ICD-10-CM | POA: Diagnosis not present

## 2021-01-11 LAB — LIPID PANEL
Cholesterol: 132 mg/dL (ref 0–200)
HDL: 41 mg/dL (ref >40)
LDL Cholesterol: 64 mg/dL (ref 0–99)
Total CHOL/HDL Ratio: 3.2 RATIO
Triglycerides: 136 mg/dL (ref <150)
VLDL: 27 mg/dL (ref 0–40)

## 2021-01-11 MED ORDER — RIVAROXABAN 20 MG PO TABS
20.0000 mg | ORAL_TABLET | Freq: Every day | ORAL | Status: DC
  Administered 2021-01-11: 20 mg via ORAL
  Filled 2021-01-11: qty 1

## 2021-01-11 NOTE — Care Management (Signed)
1154 01-11-21 Case Manager spoke with patient regarding consult for medication assistance. Patient states that she is receiving assistance from [name] for [facility]. No further needs from Case Manager at this time.

## 2021-01-11 NOTE — Progress Notes (Signed)
PROGRESS NOTE    Jennifer [name]  GYI:[id number] DOB: [birth year] DOA: 01/09/2021 PCP: Jarrett Soho, PA-C    Chief Complaint  Patient presents with   Chest Pain    Brief Narrative:  Jennifer [name] is a 75 y.o. female with medical history significant of CAD with multivessel disease and significantly LAD 1> 90% stenosis June 2022, HTN, HLD, GERD, BPPV, spinal stenosis lumbar region, presented with recurrent chest pains and palpitations.  Subjective:  Feel tired, denies chest pain, she does not feel like she can go home today, she lives by herself She thinks she is having another UTI  Assessment & Plan:   Active Problems:   Ischemic chest pain (HCC)   Chest pain  New onset a flutter Seen by cardiology Restarted betablocker, currently in sinus rhythm Reports had bradycardia/fatigue while on high dose of betablocker in the past, It appeared that betablocker was discontinued in the recent past Cards recommend xarelto  -Appreciate cardiology recommendation  Troponin elevated suspect demand ischemia due to her atrial flutter with rapid ventricular rate She is seen by cardiology, no need of further ischemic evaluation per cardiology recommendation  Hypertension Stable on current regimen  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,  self catheterization h/o frequent UTIs Followed by urology Dr Ethelene Hal  F/u with  ID Dr Ninetta Lights Reports urinary symptoms, started on fosfomycin    Obesity: Body mass index is 35.95 kg/m.Marland KitchenRecommend outpatient sleep study       Unresulted Labs (From admission, onward)    None         DVT prophylaxis: enoxaparin (LOVENOX) injection 40 mg Start: 01/10/21 1400   Code Status: Full Family Communication: Patient Disposition:   Status is: Observation  Dispo: The patient is from: Home              Anticipated d/c is to: Home              Anticipated d/c date is: Likely tomorrow                Consultants:   Cardiology  Procedures:  None  Antimicrobials:   Anti-infectives (From admission, onward)    Start     Dose/Rate Route Frequency Ordered Stop   01/11/21 1000  amoxicillin-clavulanate (AUGMENTIN) 500-125 MG per tablet 500 mg  Status:  Discontinued        500 mg Oral 3 times daily 01/10/21 1303 01/10/21 2114   01/10/21 2200  fosfomycin (MONUROL) packet 3 g        3 g Oral every 72 hours 01/10/21 2114 01/16/21 2159   01/10/21 0830  cefTRIAXone (ROCEPHIN) 2 g in sodium chloride 0.9 % 100 mL IVPB        2 g 200 mL/hr over 30 Minutes Intravenous  Once 01/10/21 0815 01/10/21 0856           Objective: Vitals:   01/11/21 0000 01/11/21 0007 01/11/21 0504 01/11/21 0803  BP: (!) 143/115 136/60 132/60 (!) 142/78  Pulse: 78 71 60 61  Resp: 18  17 18   Temp: 99.4 F (37.4 C)  98.1 F (36.7 C) 98.1 F (36.7 C)  TempSrc: Oral  Axillary Oral  SpO2: 95%  100% 98%  Weight:      Height:       No intake or output data in the 24 hours ending 01/11/21 1041 Filed Weights   01/09/21 2200  Weight: 95 kg    Examination:  General exam: alert, awake, communicative,calm, NAD Respiratory system:  Clear to auscultation. Respiratory effort normal. Cardiovascular system:  RRR.  Gastrointestinal system: Abdomen is nondistended, soft and nontender.  Normal bowel sounds heard. Central nervous system: Alert and oriented. No focal neurological deficits. Extremities:  no edema Skin: No rashes, lesions or ulcers Psychiatry: Judgement and insight appear normal. Mood & affect appropriate.     Data Reviewed: I have personally reviewed following labs and imaging studies  CBC: Recent Labs  Lab 01/09/21 2210  WBC 9.8  HGB 11.8*  HCT 36.8  MCV 88.5  PLT 311    Basic Metabolic Panel: Recent Labs  Lab 01/09/21 2210 01/10/21 1821  NA 139  --   K 4.0  --   CL 105  --   CO2 23  --   GLUCOSE 117*  --   BUN 12  --   CREATININE 0.88  --   CALCIUM 9.2  --   MG  --  2.1  PHOS  --  4.1     GFR: Estimated Creatinine Clearance: 63.6 mL/min (by C-G formula based on SCr of 0.88 mg/dL).  Liver Function Tests: Recent Labs  Lab 01/09/21 2210  AST 25  ALT 19  ALKPHOS 62  BILITOT 0.7  PROT 7.0  ALBUMIN 3.9    CBG: No results for input(s): GLUCAP in the last 168 hours.   Recent Results (from the past 240 hour(s))  Resp Panel by RT-PCR (Flu A&B, Covid) Nasopharyngeal Swab     Status: None   Collection Time: 01/10/21 10:01 PM   Specimen: Nasopharyngeal Swab; Nasopharyngeal(NP) swabs in vial transport medium  Result Value Ref Range Status   SARS Coronavirus 2 by RT PCR NEGATIVE NEGATIVE Final    Comment: (NOTE) SARS-CoV-2 target nucleic acids are NOT DETECTED.  The SARS-CoV-2 RNA is generally detectable in upper respiratory specimens during the acute phase of infection. The lowest concentration of SARS-CoV-2 viral copies this assay can detect is 138 copies/mL. A negative result does not preclude SARS-Cov-2 infection and should not be used as the sole basis for treatment or other patient management decisions. A negative result may occur with  improper specimen collection/handling, submission of specimen other than nasopharyngeal swab, presence of viral mutation(s) within the areas targeted by this assay, and inadequate number of viral copies(<138 copies/mL). A negative result must be combined with clinical observations, patient history, and epidemiological information. The expected result is Negative.  Fact Sheet for Patients:  BloggerCourse.com  Fact Sheet for Healthcare Providers:  SeriousBroker.it  This test is no t yet approved or cleared by the [place] FDA and  has been authorized for detection and/or diagnosis of SARS-CoV-2 by FDA under an Emergency Use Authorization (EUA). This EUA will remain  in effect (meaning this test can be used) for the duration of the COVID-19 declaration under Section  564(b)(1) of the Act, 21 U.S.C.section 360bbb-3(b)(1), unless the authorization is terminated  or revoked sooner.       Influenza A by PCR NEGATIVE NEGATIVE Final   Influenza B by PCR NEGATIVE NEGATIVE Final    Comment: (NOTE) The Xpert Xpress SARS-CoV-2/FLU/RSV plus assay is intended as an aid in the diagnosis of influenza from Nasopharyngeal swab specimens and should not be used as a sole basis for treatment. Nasal washings and aspirates are unacceptable for Xpert Xpress SARS-CoV-2/FLU/RSV testing.  Fact Sheet for Patients: BloggerCourse.com  Fact Sheet for Healthcare Providers: SeriousBroker.it  This test is not yet approved or cleared by the [place] FDA and has been authorized for detection and/or diagnosis  of SARS-CoV-2 by FDA under an Emergency Use Authorization (EUA). This EUA will remain in effect (meaning this test can be used) for the duration of the COVID-19 declaration under Section 564(b)(1) of the Act, 21 U.S.C. section 360bbb-3(b)(1), unless the authorization is terminated or revoked.  Performed at [facility], [address] [place], [place] [address]          Radiology Studies: DG Chest 2 View  Result Date: 01/09/2021 CLINICAL DATA:  Central chest pain, short of breath, palpitations EXAM: CHEST - 2 VIEW COMPARISON:  10/03/2020 FINDINGS: Frontal and lateral views of the chest demonstrate a stable cardiac silhouette. No acute airspace disease, effusion, or pneumothorax. As it is fissure again incidentally noted. Prominent right convex thoracic scoliosis unchanged. IMPRESSION: 1. No acute intrathoracic process. Electronically Signed   By: Sharlet Salina M.D.   On: 01/09/2021 22:34   ECHOCARDIOGRAM COMPLETE  Result Date: 01/10/2021    ECHOCARDIOGRAM REPORT   Patient Name:   ASRA GAMBREL Date of Exam: 01/10/2021 Medical Rec #:  [id number]        Height:       64.0 in Accession #:    [id number]        Weight:       209.4 lb Date of Birth:  Mar 04, 1946        BSA:          1.995 m Patient Age:    75 years         BP:           133/81 mmHg Patient Gender: F                HR:           55 bpm. Exam Location:  Inpatient Procedure: 2D Echo, Cardiac Doppler and Color Doppler Indications:    Atrial Flutter  History:        Patient has prior history of Echocardiogram examinations, most                 recent 09/15/2020. Signs/Symptoms:Murmur; Risk                 Factors:Hypertension, Dyslipidemia and Sleep Apnea.  Sonographer:    Roosvelt Maser RDCS Referring Phys: [phone] LINDSAY B ROBERTS IMPRESSIONS  1. Left ventricular ejection fraction, by estimation, is 60 to 65%. The left ventricle has normal function. The left ventricle has no regional wall motion abnormalities. There is severe concentric left ventricular hypertrophy. Left ventricular diastolic  parameters are consistent with Grade I diastolic dysfunction (impaired relaxation).  2. Right ventricular systolic function is normal. The right ventricular size is normal. Tricuspid regurgitation signal is inadequate for assessing PA pressure.  3. The mitral valve is normal in structure. No evidence of mitral valve regurgitation. No evidence of mitral stenosis.  4. The aortic valve is tricuspid. Aortic valve regurgitation is not visualized. Mild aortic valve stenosis. Aortic valve mean gradient measures 16.0 mmHg. Aortic valve Vmax measures 2.85 m/s. Best assessed at RUSB view [facility]).  5. Study dont in sinus rhythm Comparison(s): A prior study was performed on 09/15/20. Aorta is WNL in this study. Conclusion(s)/Recommendation(s): In future studies, RUSB Pedoff will be important for following aortic valve disease. FINDINGS  Left Ventricle: Left ventricular ejection fraction, by estimation, is 60 to 65%. The left ventricle has normal function. The left ventricle has no regional wall motion abnormalities. Global longitudinal strain performed but not reported based on  interpreter judgement due to suboptimal tracking.  The left ventricular internal cavity size was normal in size. There is severe concentric left ventricular hypertrophy. Left ventricular diastolic parameters are consistent with Grade I diastolic dysfunction (impaired relaxation). Right Ventricle: The right ventricular size is normal. No increase in right ventricular wall thickness. Right ventricular systolic function is normal. Tricuspid regurgitation signal is inadequate for assessing PA pressure. Left Atrium: Left atrial size was normal in size. Right Atrium: Right atrial size was normal in size. Pericardium: Trivial pericardial effusion is present. Mitral Valve: The mitral valve is normal in structure. No evidence of mitral valve regurgitation. No evidence of mitral valve stenosis. Tricuspid Valve: The tricuspid valve is normal in structure. Tricuspid valve regurgitation is not demonstrated. No evidence of tricuspid stenosis. Aortic Valve: The aortic valve is tricuspid. Aortic valve regurgitation is not visualized. Mild aortic stenosis is present. Aortic valve mean gradient measures 16.0 mmHg. Aortic valve peak gradient measures 32.5 mmHg. Aortic valve area, by VTI measures 2.28 cm. Pulmonic Valve: The pulmonic valve was not well visualized. Pulmonic valve regurgitation is not visualized. No evidence of pulmonic stenosis. Aorta: The aortic root and ascending aorta are structurally normal, with no evidence of dilitation. IAS/Shunts: The atrial septum is grossly normal.  LEFT VENTRICLE PLAX 2D LVIDd:         4.00 cm   Diastology LVIDs:         2.90 cm   LV e' medial:    4.68 cm/s LV PW:         1.50 cm   LV E/e' medial:  14.0 LV IVS:        1.40 cm   LV e' lateral:   5.00 cm/s LVOT diam:     2.20 cm   LV E/e' lateral: 13.1 LV SV:         120 LV SV Index:   60 LVOT Area:     3.80 cm  RIGHT VENTRICLE RV Basal diam:  3.80 cm LEFT ATRIUM           Index        RIGHT ATRIUM           Index LA diam:      3.90 cm 1.95  cm/m   RA Area:     18.50 cm LA Vol (A4C): 54.9 ml 27.52 ml/m  RA Volume:   58.00 ml  29.07 ml/m  AORTIC VALVE AV Area (Vmax):    1.89 cm AV Area (Vmean):   2.12 cm AV Area (VTI):     2.28 cm AV Vmax:           285.00 cm/s AV Vmean:          173.250 cm/s AV VTI:            0.524 m AV Peak Grad:      32.5 mmHg AV Mean Grad:      16.0 mmHg LVOT Vmax:         142.00 cm/s LVOT Vmean:        96.800 cm/s LVOT VTI:          0.315 m LVOT/AV VTI ratio: 0.60  AORTA Ao Root diam: 2.80 cm Ao Asc diam:  3.70 cm MITRAL VALVE MV Area (PHT): 2.55 cm     SHUNTS MV Decel Time: 297 msec     Systemic VTI:  0.32 m MV E velocity: 65.60 cm/s   Systemic Diam: 2.20 cm MV A velocity: 117.00 cm/s MV E/A ratio:  0.56 Riley Lam MD Electronically signed  by Riley Lam MD Signature Date/Time: 01/10/2021/5:52:21 PM    Final         Scheduled Meds:  aspirin EC  81 mg Oral Daily   atorvastatin  40 mg Oral Daily   enoxaparin (LOVENOX) injection  40 mg Subcutaneous Q24H   fosfomycin  3 g Oral Q72H   hydrALAZINE  50 mg Oral TID   isosorbide dinitrate  10 mg Oral TID   loratadine  10 mg Oral Daily   losartan  100 mg Oral Daily   metoprolol tartrate  25 mg Oral BID   nystatin cream   Topical BID   pantoprazole  40 mg Oral Daily   spironolactone  25 mg Oral Daily   Continuous Infusions:   LOS: 0 days   Time spent: Greater than 50% of this time was spent in counseling, explanation of diagnosis, planning of further management, and coordination of care.   Voice Recognition Reubin [place] dictation system was used to create this note, attempts have been made to correct errors. Please contact the author with questions and/or clarifications.   Albertine Grates, MD PhD FACP [facility]  Available via Epic secure chat 7am-7pm for nonurgent issues Please page for urgent issues To page the attending provider between 7A-7P or the covering provider during after hours 7P-7A, please log into the web site  [email or web address] and access using universal [place] password for that web site. If you do not have the password, please call the hospital operator.    01/11/2021, 10:41 AM

## 2021-01-11 NOTE — Progress Notes (Signed)
Progress Note  Patient Name: Jennifer [name] Date of Encounter: 01/11/2021  Primary Cardiologist: Christell Constant, MD   Subjective   Patient seen examined at her bedside.  She is awake eating breakfast when I arrived.  She tells me that she is grateful that she came to the emergency department because she feels a lot better.  The fluttering has improved significantly.  No chest pain or shortness of breath has improved as well.  No other complaints at this time.  Inpatient Medications    Scheduled Meds:  aspirin EC  81 mg Oral Daily   atorvastatin  40 mg Oral Daily   enoxaparin (LOVENOX) injection  40 mg Subcutaneous Q24H   fosfomycin  3 g Oral Q72H   hydrALAZINE  50 mg Oral TID   isosorbide dinitrate  10 mg Oral TID   loratadine  10 mg Oral Daily   losartan  100 mg Oral Daily   metoprolol tartrate  25 mg Oral BID   nystatin cream   Topical BID   pantoprazole  40 mg Oral Daily   spironolactone  25 mg Oral Daily   Continuous Infusions:  PRN Meds: acetaminophen, dextromethorphan-guaiFENesin, HYDROcodone-acetaminophen, hydrocortisone, meclizine, nitroGLYCERIN, ondansetron, sucralfate   Vital Signs    Vitals:   01/11/21 0000 01/11/21 0007 01/11/21 0504 01/11/21 0803  BP: (!) 143/115 136/60 132/60 (!) 142/78  Pulse: 78 71 60 61  Resp: 18  17 18   Temp: 99.4 F (37.4 C)  98.1 F (36.7 C) 98.1 F (36.7 C)  TempSrc: Oral  Axillary Oral  SpO2: 95%  100% 98%  Weight:      Height:       No intake or output data in the 24 hours ending 01/11/21 0956 Filed Weights   01/09/21 2200  Weight: 95 kg    Telemetry    Sinus rhythm, heart rate ranging from 60-86 bpm with occasional PVCs- Personally Reviewed  ECG    No EKG today- Personally Reviewed  Physical Exam   GEN: No acute distress.   Neck: No JVD Cardiac: RRR, no murmurs, rubs, or gallops.  Respiratory: Clear to auscultation bilaterally. GI: Soft, nontender, non-distended  MS: No edema; No  deformity. Neuro:  Nonfocal  Psych: Normal affect   Labs    Chemistry Recent Labs  Lab 01/09/21 2210  NA 139  K 4.0  CL 105  CO2 23  GLUCOSE 117*  BUN 12  CREATININE 0.88  CALCIUM 9.2  PROT 7.0  ALBUMIN 3.9  AST 25  ALT 19  ALKPHOS 62  BILITOT 0.7  GFRNONAA >60  ANIONGAP 11     Hematology Recent Labs  Lab 01/09/21 2210  WBC 9.8  RBC 4.16  HGB 11.8*  HCT 36.8  MCV 88.5  MCH 28.4  MCHC 32.1  RDW 14.8  PLT 311    Cardiac EnzymesNo results for input(s): TROPONINI in the last 168 hours. No results for input(s): TROPIPOC in the last 168 hours.   BNPNo results for input(s): BNP, PROBNP in the last 168 hours.   DDimer No results for input(s): DDIMER in the last 168 hours.   Radiology    DG Chest 2 View  Result Date: 01/09/2021 CLINICAL DATA:  Central chest pain, short of breath, palpitations EXAM: CHEST - 2 VIEW COMPARISON:  10/03/2020 FINDINGS: Frontal and lateral views of the chest demonstrate a stable cardiac silhouette. No acute airspace disease, effusion, or pneumothorax. As it is fissure again incidentally noted. Prominent right convex thoracic scoliosis unchanged. IMPRESSION:  1. No acute intrathoracic process. Electronically Signed   By: Sharlet Salina M.D.   On: 01/09/2021 22:34   ECHOCARDIOGRAM COMPLETE  Result Date: 01/10/2021    ECHOCARDIOGRAM REPORT   Patient Name:   Jennifer [name] Date of Exam: 01/10/2021 Medical Rec #:  [id number]        Height:       64.0 in Accession #:    [id number]       Weight:       209.4 lb Date of Birth:  Jun 21, 1945        BSA:          1.995 m Patient Age:    75 years         BP:           133/81 mmHg Patient Gender: F                HR:           55 bpm. Exam Location:  Inpatient Procedure: 2D Echo, Cardiac Doppler and Color Doppler Indications:    Atrial Flutter  History:        Patient has prior history of Echocardiogram examinations, most                 recent 09/15/2020. Signs/Symptoms:Murmur; Risk                  Factors:Hypertension, Dyslipidemia and Sleep Apnea.  Sonographer:    Roosvelt Maser RDCS Referring Phys: [phone] LINDSAY B ROBERTS IMPRESSIONS  1. Left ventricular ejection fraction, by estimation, is 60 to 65%. The left ventricle has normal function. The left ventricle has no regional wall motion abnormalities. There is severe concentric left ventricular hypertrophy. Left ventricular diastolic  parameters are consistent with Grade I diastolic dysfunction (impaired relaxation).  2. Right ventricular systolic function is normal. The right ventricular size is normal. Tricuspid regurgitation signal is inadequate for assessing PA pressure.  3. The mitral valve is normal in structure. No evidence of mitral valve regurgitation. No evidence of mitral stenosis.  4. The aortic valve is tricuspid. Aortic valve regurgitation is not visualized. Mild aortic valve stenosis. Aortic valve mean gradient measures 16.0 mmHg. Aortic valve Vmax measures 2.85 m/s. Best assessed at RUSB view [facility]).  5. Study dont in sinus rhythm Comparison(s): A prior study was performed on 09/15/20. Aorta is WNL in this study. Conclusion(s)/Recommendation(s): In future studies, RUSB Pedoff will be important for following aortic valve disease. FINDINGS  Left Ventricle: Left ventricular ejection fraction, by estimation, is 60 to 65%. The left ventricle has normal function. The left ventricle has no regional wall motion abnormalities. Global longitudinal strain performed but not reported based on interpreter judgement due to suboptimal tracking. The left ventricular internal cavity size was normal in size. There is severe concentric left ventricular hypertrophy. Left ventricular diastolic parameters are consistent with Grade I diastolic dysfunction (impaired relaxation). Right Ventricle: The right ventricular size is normal. No increase in right ventricular wall thickness. Right ventricular systolic function is normal. Tricuspid regurgitation signal is  inadequate for assessing PA pressure. Left Atrium: Left atrial size was normal in size. Right Atrium: Right atrial size was normal in size. Pericardium: Trivial pericardial effusion is present. Mitral Valve: The mitral valve is normal in structure. No evidence of mitral valve regurgitation. No evidence of mitral valve stenosis. Tricuspid Valve: The tricuspid valve is normal in structure. Tricuspid valve regurgitation is not demonstrated. No evidence of tricuspid stenosis. Aortic Valve: The aortic valve is  tricuspid. Aortic valve regurgitation is not visualized. Mild aortic stenosis is present. Aortic valve mean gradient measures 16.0 mmHg. Aortic valve peak gradient measures 32.5 mmHg. Aortic valve area, by VTI measures 2.28 cm. Pulmonic Valve: The pulmonic valve was not well visualized. Pulmonic valve regurgitation is not visualized. No evidence of pulmonic stenosis. Aorta: The aortic root and ascending aorta are structurally normal, with no evidence of dilitation. IAS/Shunts: The atrial septum is grossly normal.  LEFT VENTRICLE PLAX 2D LVIDd:         4.00 cm   Diastology LVIDs:         2.90 cm   LV e' medial:    4.68 cm/s LV PW:         1.50 cm   LV E/e' medial:  14.0 LV IVS:        1.40 cm   LV e' lateral:   5.00 cm/s LVOT diam:     2.20 cm   LV E/e' lateral: 13.1 LV SV:         120 LV SV Index:   60 LVOT Area:     3.80 cm  RIGHT VENTRICLE RV Basal diam:  3.80 cm LEFT ATRIUM           Index        RIGHT ATRIUM           Index LA diam:      3.90 cm 1.95 cm/m   RA Area:     18.50 cm LA Vol (A4C): 54.9 ml 27.52 ml/m  RA Volume:   58.00 ml  29.07 ml/m  AORTIC VALVE AV Area (Vmax):    1.89 cm AV Area (Vmean):   2.12 cm AV Area (VTI):     2.28 cm AV Vmax:           285.00 cm/s AV Vmean:          173.250 cm/s AV VTI:            0.524 m AV Peak Grad:      32.5 mmHg AV Mean Grad:      16.0 mmHg LVOT Vmax:         142.00 cm/s LVOT Vmean:        96.800 cm/s LVOT VTI:          0.315 m LVOT/AV VTI ratio: 0.60  AORTA  Ao Root diam: 2.80 cm Ao Asc diam:  3.70 cm MITRAL VALVE MV Area (PHT): 2.55 cm     SHUNTS MV Decel Time: 297 msec     Systemic VTI:  0.32 m MV E velocity: 65.60 cm/s   Systemic Diam: 2.20 cm MV A velocity: 117.00 cm/s MV E/A ratio:  0.56 Riley Lam MD Electronically signed by Riley Lam MD Signature Date/Time: 01/10/2021/5:52:21 PM    Final     Cardiac Studies   Echocardiogram done January 10, 2021 IMPRESSIONS   1. Left ventricular ejection fraction, by estimation, is 60 to 65%. The  left ventricle has normal function. The left ventricle has no regional  wall motion abnormalities. There is severe concentric left ventricular  hypertrophy. Left ventricular diastolic   parameters are consistent with Grade I diastolic dysfunction (impaired  relaxation).   2. Right ventricular systolic function is normal. The right ventricular  size is normal. Tricuspid regurgitation signal is inadequate for assessing  PA pressure.   3. The mitral valve is normal in structure. No evidence of mitral valve  regurgitation. No evidence of mitral stenosis.   4.  The aortic valve is tricuspid. Aortic valve regurgitation is not  visualized. Mild aortic valve stenosis. Aortic valve mean gradient  measures 16.0 mmHg. Aortic valve Vmax measures 2.85 m/s. Best assessed at  RUSB view [facility]).   5. Study dont in sinus rhythm   Comparison(s): A prior study was performed on 09/15/20. Aorta is WNL in  this study.    Patient Profile     75 y.o. female with hypertension, PVCs, hyperlipidemia, CAD and now recently diagnosed atrial flutter.  Assessment & Plan    New onset atrial flutter-she is in sinus rhythm this morning.  Has been started on a beta-blocker and Lovenox I do recommend transition the patient to Xarelto she is agreeable for this.  She also seems to be tolerating the beta-blocker very well. Her repeat echocardiogram centrally unchanged from prior. The patient will be set up with EP  appointment for EP at the time of discharge.  Troponin elevated suspect demand ischemia due to her atrial flutter with rapid ventricular rate, though patient does have significant coronary artery disease her symptoms does not appear to be anginal.  We will continue her current medication.  If symptoms doctor's exchange and suggestion of angina we will optimize her antianginals. No need for any further ischemic evaluation.   Hypertension-her blood pressure is slightly elevated she is currently on Metroprolol tartrate 25 mg twice daily that was started in the setting of her atrial flutter, losartan 100 mg daily, Aldactone 25 mg daily, hydralazine 50 mg 3 times daily.  If needed we can increase her hydralazine to 75 mg 3 times daily.  I would not consider switching her ARB at this time given her many allergies and has had interactions to multiple medications.    Hyperlipidemia- on statin.   GERD - on PPI   For questions or updates, please contact [facility] Please consult [email or web address] for contact info under Cardiology/STEMI.      Signed, Thomasene Ripple, DO  01/11/2021, 9:56 AM

## 2021-01-11 NOTE — Care Management Obs Status (Signed)
MEDICARE OBSERVATION STATUS NOTIFICATION   Patient Details  Name: Jennifer [name] MRN: [id number] Date of Birth: 05/11/1945   Medicare Observation Status Notification Given:  Yes    Gala Lewandowsky, RN 01/11/2021, 1:56 PM

## 2021-01-11 NOTE — Discharge Instructions (Signed)

## 2021-01-11 NOTE — Patient Outreach (Signed)
[facility]) Care Management  01/11/2021  Jennifer [name] [birth year] [id number]  Patient was admitted to the hospital on 01/10/21. Nurse Health Coach will perform case closure and transfer patient to the [facility] CM Team. Nurse has informed [facility] Coordinator of patient's admission.    Plan: RN Health Coach Discipline Closure   Blanchie Serve RN, Mining engineer [facility] [phone] [email or web address]

## 2021-01-11 NOTE — Progress Notes (Signed)
ANTICOAGULATION CONSULT NOTE - Initial Consult  Pharmacy Consult for Rivaroxaban Indication: atrial fibrillation  Allergies  Allergen Reactions   Moxifloxacin Hives, Shortness Of Breath, Swelling and Rash    Rash was severe, caused tremors, ans skin became BLOODY RED   Quinolones Anaphylaxis   Sulfa Antibiotics Hives and Rash   Furosemide Other (See Comments)    CANNOT TAKE DUE TO Interstitial cystitis   Aspirin Nausea Only and Other (See Comments)    GI and stomach upset   Duloxetine Hcl Nausea Only and Other (See Comments)    Vertigo and heart racing also    Hydrochlorothiazide Other (See Comments)    Headaches    Ibuprofen Nausea Only and Other (See Comments)    GI upset   Other Other (See Comments)   Robaxin [Methocarbamol] Other (See Comments)    "Make me feel flu-like symptoms"   Topiramate Itching   Zofran [Ondansetron Hcl] Other (See Comments)    Headaches    Codeine Hives, Nausea And Vomiting and Rash   Dimethyl Sulfoxide Hives and Itching   Macrodantin [Nitrofurantoin Macrocrystal] Itching, Nausea And Vomiting and Rash    Abdominal and chest pain, also- blood-red skin, also   Nitrofurantoin Diarrhea, Itching, Rash and Other (See Comments)    GI/stomach upset, sweating, chest pain, and skin turned bloody red   Tape Rash   Yellow Dye Rash    Patient Measurements: Height: 5\' 4"  (162.6 cm) Weight: 95 kg (209 lb 7 oz) IBW/kg (Calculated) : 54.7  Vital Signs: Temp: 98.3 F (36.8 C) (10/12 1248) Temp Source: Oral (10/12 1248) BP: 130/62 (10/12 1248) Pulse Rate: 65 (10/12 1248)  Labs: Recent Labs    01/09/21 2210 01/10/21 0032  HGB 11.8*  --   HCT 36.8  --   PLT 311  --   CREATININE 0.88  --   TROPONINIHS 56* 54*    Estimated Creatinine Clearance: 63.6 mL/min (by C-G formula based on SCr of 0.88 mg/dL).   Medical History: Past Medical History:  Diagnosis Date   Acute meniscal tear of left knee    FOLLOWED BY DR GIAFFREY   BPPV (benign  paroxysmal positional vertigo)    Chronic bladder pain    Chronic fatigue syndrome    Chronic low back pain    W/ RIGHT LEG PAIN AND NUMBNESS   Cystitis, chronic    Fibromyalgia    GERD (gastroesophageal reflux disease)    Hiatal hernia    Hypertension    IC (interstitial cystitis)    OSA (obstructive sleep apnea)    per pt study yrs ago-- moderate osa ,  intolerant cpap   Pinched vertebral nerve    bilateral L2 -- L3 and L3 -- L4-/  epidural injection's treatment , PT and pain clince   PONV (postoperative nausea and vomiting)    severe   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 Pharmacy consulted to dose rivaroxaban for Afib per cardiology recommendation.   Goal of Therapy:  Monitor platelets by anticoagulation protocol: Yes   Plan:  Start rivaroxaban 20mg  daily  Pharmacy to provide education prior to discharge   03/11/2021, PharmD, BCPS Clinical Pharmacist 01/11/2021 7:35 PM

## 2021-01-11 NOTE — Consult Note (Signed)
   [facility] Inpatient Consult   01/11/2021  Jennifer [name] 06-25-1945 [id number]   [facility] [[facility]]  Accountable Care Organization [ACO] Patient: Medicare CMS DCE  Primary Care Provider:  [facility]  Patient is currently active with [facility] [[facility]] Care Management for chronic disease management services.  Patient has been engaged by a [facility].  Our community based plan of care has focused on disease management and community resource support.   Plan: Follow patient's progress and follow with the Inpatient Transition Of Care [TOC] team member to make aware that [facility] Care Management following. Refer patient for post hospital follow up as appropriate.  Of note, [facility] Care Management services does not replace or interfere with any services that are needed or arranged by inpatient [facility] care management team.  For additional questions or referrals please contact:  Charlesetta Shanks, RN BSN CCM [facility]  [phone] business mobile phone Toll free office [phone]  Fax number: [phone] [email or web address]

## 2021-01-12 ENCOUNTER — Other Ambulatory Visit (HOSPITAL_COMMUNITY): Payer: Self-pay

## 2021-01-12 ENCOUNTER — Telehealth: Payer: Self-pay | Admitting: Pharmacist

## 2021-01-12 ENCOUNTER — Other Ambulatory Visit: Payer: Self-pay

## 2021-01-12 DIAGNOSIS — R0789 Other chest pain: Secondary | ICD-10-CM | POA: Diagnosis not present

## 2021-01-12 DIAGNOSIS — I483 Typical atrial flutter: Secondary | ICD-10-CM | POA: Diagnosis not present

## 2021-01-12 DIAGNOSIS — I10 Essential (primary) hypertension: Secondary | ICD-10-CM | POA: Diagnosis not present

## 2021-01-12 DIAGNOSIS — I4892 Unspecified atrial flutter: Secondary | ICD-10-CM | POA: Diagnosis not present

## 2021-01-12 MED ORDER — METOPROLOL TARTRATE 25 MG PO TABS
25.0000 mg | ORAL_TABLET | Freq: Two times a day (BID) | ORAL | 0 refills | Status: DC
  Filled 2021-01-12: qty 60, 30d supply, fill #0

## 2021-01-12 MED ORDER — RIVAROXABAN 20 MG PO TABS
20.0000 mg | ORAL_TABLET | Freq: Every day | ORAL | 0 refills | Status: DC
  Filled 2021-01-12: qty 30, 30d supply, fill #0

## 2021-01-12 MED ORDER — FOSFOMYCIN TROMETHAMINE 3 G PO PACK
3.0000 g | PACK | ORAL | 0 refills | Status: AC
  Filled 2021-01-12: qty 2, 6d supply, fill #0

## 2021-01-12 NOTE — Progress Notes (Signed)
Pt stated pain is decreased to 3/10. Medication effective.

## 2021-01-12 NOTE — Progress Notes (Signed)
Progress Note  Patient Name: Jennifer [name] Date of Encounter: 01/12/2021  Primary Cardiologist: Christell Constant, MD   Subjective   Patient seen and examined at her bedside. She is nervous about transportation for discharge. No chest pain or shortness of breath. Feels well otherwise, just anxious.  Inpatient Medications    Scheduled Meds:  aspirin EC  81 mg Oral Daily   atorvastatin  40 mg Oral Daily   fosfomycin  3 g Oral Q72H   hydrALAZINE  50 mg Oral TID   isosorbide dinitrate  10 mg Oral TID   loratadine  10 mg Oral Daily   losartan  100 mg Oral Daily   metoprolol tartrate  25 mg Oral BID   nystatin cream   Topical BID   pantoprazole  40 mg Oral Daily   rivaroxaban  20 mg Oral Q supper   spironolactone  25 mg Oral Daily   Continuous Infusions:  PRN Meds: acetaminophen, dextromethorphan-guaiFENesin, HYDROcodone-acetaminophen, hydrocortisone, meclizine, nitroGLYCERIN, ondansetron, sucralfate   Vital Signs    Vitals:   01/11/21 0803 01/11/21 1248 01/11/21 2111 01/12/21 0207  BP: (!) 142/78 130/62 116/64 111/64  Pulse: 61 65 65   Resp: 18 19 18    Temp: 98.1 F (36.7 C) 98.3 F (36.8 C) 98.9 F (37.2 C)   TempSrc: Oral Oral Oral   SpO2: 98% 97% 98%   Weight:      Height:        Intake/Output Summary (Last 24 hours) at 01/12/2021 0836 Last data filed at 01/12/2021 01/14/2021 Gross per 24 hour  Intake 360 ml  Output --  Net 360 ml   Filed Weights   01/09/21 2200  Weight: 95 kg    Telemetry    Sinus rhythm HR 60-80 bpm - Personally Reviewed  ECG    None for 24 hours - scan ECG yesterday from 10/11 atrial flutter - Personally Reviewed  Physical Exam   GEN: No acute distress.   Neck: No JVD Cardiac: RRR, no murmurs, rubs, or gallops.  Respiratory: Clear to auscultation bilaterally. GI: Soft, nontender, non-distended  MS: No edema; No deformity. Neuro:  Nonfocal  Psych: Normal affect   Labs    Chemistry Recent Labs  Lab  01/09/21 2210  NA 139  K 4.0  CL 105  CO2 23  GLUCOSE 117*  BUN 12  CREATININE 0.88  CALCIUM 9.2  PROT 7.0  ALBUMIN 3.9  AST 25  ALT 19  ALKPHOS 62  BILITOT 0.7  GFRNONAA >60  ANIONGAP 11     Hematology Recent Labs  Lab 01/09/21 2210  WBC 9.8  RBC 4.16  HGB 11.8*  HCT 36.8  MCV 88.5  MCH 28.4  MCHC 32.1  RDW 14.8  PLT 311    Cardiac EnzymesNo results for input(s): TROPONINI in the last 168 hours. No results for input(s): TROPIPOC in the last 168 hours.   BNPNo results for input(s): BNP, PROBNP in the last 168 hours.   DDimer No results for input(s): DDIMER in the last 168 hours.   Radiology    ECHOCARDIOGRAM COMPLETE  Result Date: 01/10/2021    ECHOCARDIOGRAM REPORT   Patient Name:   CRISSA SOWDER Date of Exam: 01/10/2021 Medical Rec #:  03/12/2021        Height:       64.0 in Accession #:    [id number]       Weight:       209.4 lb Date of Birth:  March 25, 1946  BSA:          1.995 m Patient Age:    75 years         BP:           133/81 mmHg Patient Gender: F                HR:           55 bpm. Exam Location:  Inpatient Procedure: 2D Echo, Cardiac Doppler and Color Doppler Indications:    Atrial Flutter  History:        Patient has prior history of Echocardiogram examinations, most                 recent 09/15/2020. Signs/Symptoms:Murmur; Risk                 Factors:Hypertension, Dyslipidemia and Sleep Apnea.  Sonographer:    Roosvelt Maser RDCS Referring Phys: [phone] LINDSAY B ROBERTS IMPRESSIONS  1. Left ventricular ejection fraction, by estimation, is 60 to 65%. The left ventricle has normal function. The left ventricle has no regional wall motion abnormalities. There is severe concentric left ventricular hypertrophy. Left ventricular diastolic  parameters are consistent with Grade I diastolic dysfunction (impaired relaxation).  2. Right ventricular systolic function is normal. The right ventricular size is normal. Tricuspid regurgitation signal is inadequate for  assessing PA pressure.  3. The mitral valve is normal in structure. No evidence of mitral valve regurgitation. No evidence of mitral stenosis.  4. The aortic valve is tricuspid. Aortic valve regurgitation is not visualized. Mild aortic valve stenosis. Aortic valve mean gradient measures 16.0 mmHg. Aortic valve Vmax measures 2.85 m/s. Best assessed at RUSB view [facility]).  5. Study dont in sinus rhythm Comparison(s): A prior study was performed on 09/15/20. Aorta is WNL in this study. Conclusion(s)/Recommendation(s): In future studies, RUSB Pedoff will be important for following aortic valve disease. FINDINGS  Left Ventricle: Left ventricular ejection fraction, by estimation, is 60 to 65%. The left ventricle has normal function. The left ventricle has no regional wall motion abnormalities. Global longitudinal strain performed but not reported based on interpreter judgement due to suboptimal tracking. The left ventricular internal cavity size was normal in size. There is severe concentric left ventricular hypertrophy. Left ventricular diastolic parameters are consistent with Grade I diastolic dysfunction (impaired relaxation). Right Ventricle: The right ventricular size is normal. No increase in right ventricular wall thickness. Right ventricular systolic function is normal. Tricuspid regurgitation signal is inadequate for assessing PA pressure. Left Atrium: Left atrial size was normal in size. Right Atrium: Right atrial size was normal in size. Pericardium: Trivial pericardial effusion is present. Mitral Valve: The mitral valve is normal in structure. No evidence of mitral valve regurgitation. No evidence of mitral valve stenosis. Tricuspid Valve: The tricuspid valve is normal in structure. Tricuspid valve regurgitation is not demonstrated. No evidence of tricuspid stenosis. Aortic Valve: The aortic valve is tricuspid. Aortic valve regurgitation is not visualized. Mild aortic stenosis is present. Aortic valve mean  gradient measures 16.0 mmHg. Aortic valve peak gradient measures 32.5 mmHg. Aortic valve area, by VTI measures 2.28 cm. Pulmonic Valve: The pulmonic valve was not well visualized. Pulmonic valve regurgitation is not visualized. No evidence of pulmonic stenosis. Aorta: The aortic root and ascending aorta are structurally normal, with no evidence of dilitation. IAS/Shunts: The atrial septum is grossly normal.  LEFT VENTRICLE PLAX 2D LVIDd:         4.00 cm   Diastology LVIDs:  2.90 cm   LV e' medial:    4.68 cm/s LV PW:         1.50 cm   LV E/e' medial:  14.0 LV IVS:        1.40 cm   LV e' lateral:   5.00 cm/s LVOT diam:     2.20 cm   LV E/e' lateral: 13.1 LV SV:         120 LV SV Index:   60 LVOT Area:     3.80 cm  RIGHT VENTRICLE RV Basal diam:  3.80 cm LEFT ATRIUM           Index        RIGHT ATRIUM           Index LA diam:      3.90 cm 1.95 cm/m   RA Area:     18.50 cm LA Vol (A4C): 54.9 ml 27.52 ml/m  RA Volume:   58.00 ml  29.07 ml/m  AORTIC VALVE AV Area (Vmax):    1.89 cm AV Area (Vmean):   2.12 cm AV Area (VTI):     2.28 cm AV Vmax:           285.00 cm/s AV Vmean:          173.250 cm/s AV VTI:            0.524 m AV Peak Grad:      32.5 mmHg AV Mean Grad:      16.0 mmHg LVOT Vmax:         142.00 cm/s LVOT Vmean:        96.800 cm/s LVOT VTI:          0.315 m LVOT/AV VTI ratio: 0.60  AORTA Ao Root diam: 2.80 cm Ao Asc diam:  3.70 cm MITRAL VALVE MV Area (PHT): 2.55 cm     SHUNTS MV Decel Time: 297 msec     Systemic VTI:  0.32 m MV E velocity: 65.60 cm/s   Systemic Diam: 2.20 cm MV A velocity: 117.00 cm/s MV E/A ratio:  0.56 Riley Lam MD Electronically signed by Riley Lam MD Signature Date/Time: 01/10/2021/5:52:21 PM    Final     Cardiac Studies   Echocardiogram done January 10, 2021 IMPRESSIONS   1. Left ventricular ejection fraction, by estimation, is 60 to 65%. The  left ventricle has normal function. The left ventricle has no regional  wall motion abnormalities.  There is severe concentric left ventricular  hypertrophy. Left ventricular diastolic   parameters are consistent with Grade I diastolic dysfunction (impaired  relaxation).   2. Right ventricular systolic function is normal. The right ventricular  size is normal. Tricuspid regurgitation signal is inadequate for assessing  PA pressure.   3. The mitral valve is normal in structure. No evidence of mitral valve  regurgitation. No evidence of mitral stenosis.   4. The aortic valve is tricuspid. Aortic valve regurgitation is not  visualized. Mild aortic valve stenosis. Aortic valve mean gradient  measures 16.0 mmHg. Aortic valve Vmax measures 2.85 m/s. Best assessed at  RUSB view [facility]).   5. Study dont in sinus rhythm   Comparison(s): A prior study was performed on 09/15/20. Aorta is WNL in  this study.    Patient Profile     76 y.o. female hypertension, PVCs, hyperlipidemia, CAD and now recently diagnosed atrial flutter  Assessment & Plan     New onset atrial flutter-she is in sinus rhythm this morning.  Continue  beta blocker and xeralto.  She also seems to be tolerating the beta-blocker  and xeralto very well. Her repeat echocardiogram centrally unchanged from prior. The patient will be set up with EP appointment for EP at the time of discharge.   Troponin elevated suspect demand ischemia due to her atrial flutter with rapid ventricular rate, though patient does have significant coronary artery disease her symptoms does not appear to be anginal.  We will continue her current medication.  If symptoms doctor's exchange and suggestion of angina we will optimize her antianginals. No need for any further ischemic evaluation.   Hypertension-continue on Metroprolol tartrate 25 mg twice daily that was started in the setting of her atrial flutter, losartan 100 mg daily, Aldactone 25 mg daily, hydralazine 50 mg 3 times daily.  If needed we can increase her hydralazine to 75 mg 3 times daily.  I  would not consider switching her ARB at this time given her many allergies and has had interactions to multiple medications.     Hyperlipidemia- on statin.    GERD - on PPI   [facility] will sign off.   Medication Recommendations:  Metoprolol tartrate 25 mg BID, Xeralto 20 mg daily, losartan 100 mg daily, Aldactone 25 mg daily, hydralazine 50 mg 3 times daily Other recommendations (labs, testing, etc):  None  Follow up as an outpatient:  EP and Primary cardiologist ( we will set up the follow up)  For questions or updates, please contact [facility] Please consult [email or web address] for contact info under Cardiology/STEMI.      Signed, Thomasene Ripple, DO  01/12/2021, 8:36 AM

## 2021-01-12 NOTE — Discharge Summary (Signed)
Discharge Summary  Jennifer [name] ZOX:[id number] DOB: 09-01-1945  PCP: Jarrett Soho, PA-C  Admit date: 01/09/2021 Discharge date: 01/12/2021  Time spent:  Recommendations for Outpatient Follow-up:  F/u with PCP within a week  for hospital discharge follow up, repeat cbc/bmp at follow up F/u with cardiology New meds at discharge: lopressor , xarelto, fosfomycin   Discharge Diagnoses:  Active Hospital Problems   Diagnosis Date Noted   Chest pain 01/10/2021   Ischemic chest pain (HCC) 09/15/2020    Resolved Hospital Problems  No resolved problems to display.    Discharge Condition: stable  Diet recommendation: heart healthy  Filed Weights   01/09/21 2200  Weight: 95 kg    History of present illness: ( per admitting MD Dr Chipper Herb) Chief Complaint: Chest pain, palpitations.   HPI: Jennifer [name] is a 75 y.o. female with medical history significant of CAD with multivessel disease and significantly LAD 1> 90% stenosis June 2022, HTN, HLD, GERD, BPPV, spinal stenosis lumbar region, presented with recurrent chest pains and palpitations.   Patient was diagnosed with multivessel CAD June 2022, with a significant LAD 1 with a 90% stenosis.  And patient was found to have significant elevation in blood pressure, discharged on multiple blood pressure meds.  Was intolerant to beta-blocker and Lasix in the past, and in June she was started on hydralazine, losartan, Imdur.  Patient reported that she had episodes of palpitations, and she went back to cardiologist recently who further adjusted her BP meds, hydralazine dose was decreased from 75 to 50 mg 3 times daily, Ranexa was replaced by Imdur and losartan was continued.     However patient continued to experience frequent episodes of palpitations, and since last week, even minimal activity can trigger significant feeling of palpitations.  And yesterday, she even experienced 2-3 episodes of chest tightness along with  palpitations, and also feeling of shortness of breath.  Denies any fever or chills.  She has had recurrent cystitis flareup yesterday, with urinary frequency and PCP plan to start Augmentin today.  Last night, she checked her blood pressure at home and found SBP 220 and she took SL nitro 2 times but still having more episodes of palpitations and chest pains, thus called EMS.   ED Course: Blood pressure varies, SBP 150s to 200s.  Telemetry monitoring showed frequent PVCs sometimes bigeminy's.  EKG sinus tachycardia.  Troponin 56> 54.  EKG, no acute ST changes.  Chest x-ray no acute infiltrates.  Hospital Course:  Active Problems:   Ischemic chest pain (HCC)   Chest pain   New onset a flutter -Reports had bradycardia/fatigue while on high dose of betablocker in the past, It appeared that betablocker was discontinued in the past -Seen by cardiology, who recommended to restarted betablocker, currently in sinus rhythm -Cards also recommend xarelto  daily for CVA prevention -Appreciate cardiology recommendation, she is cleared to discharge home with close cardiology follow up   Troponin elevated suspect demand ischemia due to her atrial flutter with rapid ventricular rate She is seen by cardiology, no need of further ischemic evaluation per cardiology recommendation   Hypertension Stable on current regimen   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,  self catheterization h/o frequent ESBL UTIs, Followed by urology Dr Ethelene Hal  F/u with  ID Dr Ninetta Lights Reports urinary symptoms, started on fosfomycin      Obesity: Body mass index is 35.95 kg/m.Marland KitchenRecommend outpatient sleep study    Procedures: none  Consultations: Cardiology   Discharge  Exam: BP 111/64   Pulse 65   Temp 98.9 F (37.2 C) (Oral)   Resp 18   Ht 5\' 4"  (1.626 m)   Wt 95 kg   SpO2 98%   BMI 35.95 kg/m   General: NAD Cardiovascular: RRR Respiratory: normal respiratory effort   Discharge  Instructions You were cared for by a hospitalist during your hospital stay. If you have any questions about your discharge medications or the care you received while you were in the hospital after you are discharged, you can call the unit and asked to speak with the hospitalist on call if the hospitalist that took care of you is not available. Once you are discharged, your primary care physician will handle any further medical issues. Please note that NO REFILLS for any discharge medications will be authorized once you are discharged, as it is imperative that you return to your primary care physician (or establish a relationship with a primary care physician if you do not have one) for your aftercare needs so that they can reassess your need for medications and monitor your lab values.  Discharge Instructions     Diet - low sodium heart healthy   Complete by: As directed    Increase activity slowly   Complete by: As directed       Allergies as of 01/12/2021       Reactions   Moxifloxacin Hives, Shortness Of Breath, Swelling, Rash   Rash was severe, caused tremors, ans skin became BLOODY RED   Quinolones Anaphylaxis   Sulfa Antibiotics Hives, Rash   Furosemide Other (See Comments)   CANNOT TAKE DUE TO Interstitial cystitis   Aspirin Nausea Only, Other (See Comments)   GI and stomach upset   Duloxetine Hcl Nausea Only, Other (See Comments)   Vertigo and heart racing also   Hydrochlorothiazide Other (See Comments)   Headaches   Ibuprofen Nausea Only, Other (See Comments)   GI upset   Other Other (See Comments)   Robaxin [methocarbamol] Other (See Comments)   "Make me feel flu-like symptoms"   Topiramate Itching   Zofran [ondansetron Hcl] Other (See Comments)   Headaches   Codeine Hives, Nausea And Vomiting, Rash   Dimethyl Sulfoxide Hives, Itching   Macrodantin [nitrofurantoin Macrocrystal] Itching, Nausea And Vomiting, Rash   Abdominal and chest pain, also- blood-red skin, also    Nitrofurantoin Diarrhea, Itching, Rash, Other (See Comments)   GI/stomach upset, sweating, chest pain, and skin turned bloody red   Tape Rash   Yellow Dye Rash        Medication List     STOP taking these medications    amoxicillin-clavulanate 500-125 MG tablet Commonly known as: AUGMENTIN       TAKE these medications    acetaminophen 500 MG tablet Commonly known as: TYLENOL Take 500 mg by mouth 2 (two) times daily as needed (for pain).   aspirin EC 81 MG tablet Take 1 tablet (81 mg total) by mouth daily. Swallow whole.   atorvastatin 40 MG tablet Commonly known as: LIPITOR Take 1 tablet (40 mg total) by mouth daily.   dextromethorphan-guaiFENesin 30-600 MG 12hr tablet Commonly known as: MUCINEX DM Take 1 tablet by mouth every 12 (twelve) hours as needed for cough.   fexofenadine 60 MG tablet Commonly known as: ALLEGRA Take 60 mg by mouth See admin instructions. Take 60 mg by mouth one to two times a day as needed for allergy symptoms   fluconazole 100 MG tablet Commonly known as:  DIFLUCAN TAKE 1 TABLET BY MOUTH EVERY DAY   fosfomycin 3 g Pack Commonly known as: MONUROL Take 3 g by mouth every 3 (three) days for 6 days. Start taking on: January 13, 2021   hydrALAZINE 50 MG tablet Commonly known as: APRESOLINE Take 1 tablet (50 mg total) by mouth 3 (three) times daily.   HYDROcodone-acetaminophen 7.5-325 MG tablet Commonly known as: NORCO Take 1 tablet by mouth every 8 (eight) hours as needed for moderate pain or severe pain.   hydrocortisone 25 MG suppository Commonly known as: ANUSOL-HC Place 25 mg rectally 2 (two) times daily as needed for hemorrhoids.   isosorbide dinitrate 10 MG tablet Commonly known as: ISORDIL TAKE 1 TABLET BY MOUTH THREE TIMES A DAY   losartan 100 MG tablet Commonly known as: COZAAR Take 100 mg by mouth daily.   meclizine 25 MG tablet Commonly known as: ANTIVERT Take 25 mg by mouth 3 (three) times daily as needed for  dizziness.   Meth-Hyo-M Bl-Benz Acd-Ph Sal 81.6 MG Tabs Take 1 tablet (81.6 mg total) by mouth 2 (two) times daily as needed.   metoprolol tartrate 25 MG tablet Commonly known as: LOPRESSOR Take 1 tablet (25 mg total) by mouth 2 (two) times daily.   nitroGLYCERIN 0.4 MG SL tablet Commonly known as: NITROSTAT Place 1 tablet (0.4 mg total) under the tongue every 5 (five) minutes as needed for chest pain.   nystatin cream Commonly known as: MYCOSTATIN Apply topically 2 (two) times daily.   omeprazole 20 MG capsule Commonly known as: PRILOSEC Take 20 mg by mouth See admin instructions. Take 20 mg by mouth in the morning one hour before breakfast and an additional 20 mg before evening meal as needed for GERD   ondansetron 4 MG disintegrating tablet Commonly known as: ZOFRAN-ODT Take 4 mg by mouth every 8 (eight) hours as needed for nausea (dissolve orally).   promethazine 25 MG tablet Commonly known as: PHENERGAN Take 1 tablet (25 mg total) by mouth every 6 (six) hours as needed for refractory nausea / vomiting.   rivaroxaban 20 MG Tabs tablet Commonly known as: XARELTO Take 1 tablet (20 mg total) by mouth daily with supper.   spironolactone 25 MG tablet Commonly known as: ALDACTONE Take 1 tablet (25 mg total) by mouth daily.   sucralfate 1 g tablet Commonly known as: CARAFATE Take 1 g by mouth See admin instructions. Take 1 gram by mouth in the morning and an additional 1 gram once a day for flares       Allergies  Allergen Reactions   Moxifloxacin Hives, Shortness Of Breath, Swelling and Rash    Rash was severe, caused tremors, ans skin became BLOODY RED   Quinolones Anaphylaxis   Sulfa Antibiotics Hives and Rash   Furosemide Other (See Comments)    CANNOT TAKE DUE TO Interstitial cystitis   Aspirin Nausea Only and Other (See Comments)    GI and stomach upset   Duloxetine Hcl Nausea Only and Other (See Comments)    Vertigo and heart racing also     Hydrochlorothiazide Other (See Comments)    Headaches    Ibuprofen Nausea Only and Other (See Comments)    GI upset   Other Other (See Comments)   Robaxin [Methocarbamol] Other (See Comments)    "Make me feel flu-like symptoms"   Topiramate Itching   Zofran [Ondansetron Hcl] Other (See Comments)    Headaches    Codeine Hives, Nausea And Vomiting and Rash   Dimethyl  Sulfoxide Hives and Itching   Macrodantin [Nitrofurantoin Macrocrystal] Itching, Nausea And Vomiting and Rash    Abdominal and chest pain, also- blood-red skin, also   Nitrofurantoin Diarrhea, Itching, Rash and Other (See Comments)    GI/stomach upset, sweating, chest pain, and skin turned bloody red   Tape Rash   Yellow Dye Rash    Follow-up Information     Jarrett Soho, PA-C Follow up in 1 week(s).   Specialty: Family Medicine Why: hospital discharge follow up Contact information: [address] [place] [address] [phone]         Christell Constant, MD .   Specialty: Cardiology Contact information: [address] [place] [address] [phone]                  The results of significant diagnostics from this hospitalization (including imaging, microbiology, ancillary and laboratory) are listed below for reference.    Significant Diagnostic Studies: DG Chest 2 View  Result Date: 01/09/2021 CLINICAL DATA:  Central chest pain, short of breath, palpitations EXAM: CHEST - 2 VIEW COMPARISON:  10/03/2020 FINDINGS: Frontal and lateral views of the chest demonstrate a stable cardiac silhouette. No acute airspace disease, effusion, or pneumothorax. As it is fissure again incidentally noted. Prominent right convex thoracic scoliosis unchanged. IMPRESSION: 1. No acute intrathoracic process. Electronically Signed   By: Sharlet Salina M.D.   On: 01/09/2021 22:34   ECHOCARDIOGRAM COMPLETE  Result Date: 01/10/2021    ECHOCARDIOGRAM REPORT   Patient Name:   Jennifer [name] Date of Exam: 01/10/2021 Medical Rec #:  [id number]        Height:       64.0 in Accession #:    [id number]       Weight:       209.4 lb Date of Birth:  May 28, 1945        BSA:          1.995 m Patient Age:    75 years         BP:           133/81 mmHg Patient Gender: F                HR:           55 bpm. Exam Location:  Inpatient Procedure: 2D Echo, Cardiac Doppler and Color Doppler Indications:    Atrial Flutter  History:        Patient has prior history of Echocardiogram examinations, most                 recent 09/15/2020. Signs/Symptoms:Murmur; Risk                 Factors:Hypertension, Dyslipidemia and Sleep Apnea.  Sonographer:    Roosvelt Maser RDCS Referring Phys: [phone] LINDSAY B ROBERTS IMPRESSIONS  1. Left ventricular ejection fraction, by estimation, is 60 to 65%. The left ventricle has normal function. The left ventricle has no regional wall motion abnormalities. There is severe concentric left ventricular hypertrophy. Left ventricular diastolic  parameters are consistent with Grade I diastolic dysfunction (impaired relaxation).  2. Right ventricular systolic function is normal. The right ventricular size is normal. Tricuspid regurgitation signal is inadequate for assessing PA pressure.  3. The mitral valve is normal in structure. No evidence of mitral valve regurgitation. No evidence of mitral stenosis.  4. The aortic valve is tricuspid. Aortic valve regurgitation is not visualized. Mild aortic valve stenosis. Aortic valve mean gradient  measures 16.0 mmHg. Aortic valve Vmax measures 2.85 m/s. Best assessed at RUSB view [facility]).  5. Study dont in sinus rhythm Comparison(s): A prior study was performed on 09/15/20. Aorta is WNL in this study. Conclusion(s)/Recommendation(s): In future studies, RUSB Pedoff will be important for following aortic valve disease. FINDINGS  Left Ventricle: Left ventricular ejection fraction, by estimation, is 60 to 65%. The left ventricle has normal function. The left  ventricle has no regional wall motion abnormalities. Global longitudinal strain performed but not reported based on interpreter judgement due to suboptimal tracking. The left ventricular internal cavity size was normal in size. There is severe concentric left ventricular hypertrophy. Left ventricular diastolic parameters are consistent with Grade I diastolic dysfunction (impaired relaxation). Right Ventricle: The right ventricular size is normal. No increase in right ventricular wall thickness. Right ventricular systolic function is normal. Tricuspid regurgitation signal is inadequate for assessing PA pressure. Left Atrium: Left atrial size was normal in size. Right Atrium: Right atrial size was normal in size. Pericardium: Trivial pericardial effusion is present. Mitral Valve: The mitral valve is normal in structure. No evidence of mitral valve regurgitation. No evidence of mitral valve stenosis. Tricuspid Valve: The tricuspid valve is normal in structure. Tricuspid valve regurgitation is not demonstrated. No evidence of tricuspid stenosis. Aortic Valve: The aortic valve is tricuspid. Aortic valve regurgitation is not visualized. Mild aortic stenosis is present. Aortic valve mean gradient measures 16.0 mmHg. Aortic valve peak gradient measures 32.5 mmHg. Aortic valve area, by VTI measures 2.28 cm. Pulmonic Valve: The pulmonic valve was not well visualized. Pulmonic valve regurgitation is not visualized. No evidence of pulmonic stenosis. Aorta: The aortic root and ascending aorta are structurally normal, with no evidence of dilitation. IAS/Shunts: The atrial septum is grossly normal.  LEFT VENTRICLE PLAX 2D LVIDd:         4.00 cm   Diastology LVIDs:         2.90 cm   LV e' medial:    4.68 cm/s LV PW:         1.50 cm   LV E/e' medial:  14.0 LV IVS:        1.40 cm   LV e' lateral:   5.00 cm/s LVOT diam:     2.20 cm   LV E/e' lateral: 13.1 LV SV:         120 LV SV Index:   60 LVOT Area:     3.80 cm  RIGHT VENTRICLE  RV Basal diam:  3.80 cm LEFT ATRIUM           Index        RIGHT ATRIUM           Index LA diam:      3.90 cm 1.95 cm/m   RA Area:     18.50 cm LA Vol (A4C): 54.9 ml 27.52 ml/m  RA Volume:   58.00 ml  29.07 ml/m  AORTIC VALVE AV Area (Vmax):    1.89 cm AV Area (Vmean):   2.12 cm AV Area (VTI):     2.28 cm AV Vmax:           285.00 cm/s AV Vmean:          173.250 cm/s AV VTI:            0.524 m AV Peak Grad:      32.5 mmHg AV Mean Grad:      16.0 mmHg LVOT Vmax:  142.00 cm/s LVOT Vmean:        96.800 cm/s LVOT VTI:          0.315 m LVOT/AV VTI ratio: 0.60  AORTA Ao Root diam: 2.80 cm Ao Asc diam:  3.70 cm MITRAL VALVE MV Area (PHT): 2.55 cm     SHUNTS MV Decel Time: 297 msec     Systemic VTI:  0.32 m MV E velocity: 65.60 cm/s   Systemic Diam: 2.20 cm MV A velocity: 117.00 cm/s MV E/A ratio:  0.56 Riley Lam MD Electronically signed by Riley Lam MD Signature Date/Time: 01/10/2021/5:52:21 PM    Final     Microbiology: Recent Results (from the past 240 hour(s))  Resp Panel by RT-PCR (Flu A&B, Covid) Nasopharyngeal Swab     Status: None   Collection Time: 01/10/21 10:01 PM   Specimen: Nasopharyngeal Swab; Nasopharyngeal(NP) swabs in vial transport medium  Result Value Ref Range Status   SARS Coronavirus 2 by RT PCR NEGATIVE NEGATIVE Final    Comment: (NOTE) SARS-CoV-2 target nucleic acids are NOT DETECTED.  The SARS-CoV-2 RNA is generally detectable in upper respiratory specimens during the acute phase of infection. The lowest concentration of SARS-CoV-2 viral copies this assay can detect is 138 copies/mL. A negative result does not preclude SARS-Cov-2 infection and should not be used as the sole basis for treatment or other patient management decisions. A negative result may occur with  improper specimen collection/handling, submission of specimen other than nasopharyngeal swab, presence of viral mutation(s) within the areas targeted by this assay, and inadequate  number of viral copies(<138 copies/mL). A negative result must be combined with clinical observations, patient history, and epidemiological information. The expected result is Negative.  Fact Sheet for Patients:  BloggerCourse.com  Fact Sheet for Healthcare Providers:  SeriousBroker.it  This test is no t yet approved or cleared by the [place] FDA and  has been authorized for detection and/or diagnosis of SARS-CoV-2 by FDA under an Emergency Use Authorization (EUA). This EUA will remain  in effect (meaning this test can be used) for the duration of the COVID-19 declaration under Section 564(b)(1) of the Act, 21 U.S.C.section 360bbb-3(b)(1), unless the authorization is terminated  or revoked sooner.       Influenza A by PCR NEGATIVE NEGATIVE Final   Influenza B by PCR NEGATIVE NEGATIVE Final    Comment: (NOTE) The Xpert Xpress SARS-CoV-2/FLU/RSV plus assay is intended as an aid in the diagnosis of influenza from Nasopharyngeal swab specimens and should not be used as a sole basis for treatment. Nasal washings and aspirates are unacceptable for Xpert Xpress SARS-CoV-2/FLU/RSV testing.  Fact Sheet for Patients: BloggerCourse.com  Fact Sheet for Healthcare Providers: SeriousBroker.it  This test is not yet approved or cleared by the [place] FDA and has been authorized for detection and/or diagnosis of SARS-CoV-2 by FDA under an Emergency Use Authorization (EUA). This EUA will remain in effect (meaning this test can be used) for the duration of the COVID-19 declaration under Section 564(b)(1) of the Act, 21 U.S.C. section 360bbb-3(b)(1), unless the authorization is terminated or revoked.  Performed at [facility], [address], [place], [place] [address]      Labs: Basic Metabolic Panel: Recent Labs  Lab 01/09/21 2210 01/10/21 1821  NA 139  --   K 4.0   --   CL 105  --   CO2 23  --   GLUCOSE 117*  --   BUN 12  --   CREATININE 0.88  --  CALCIUM 9.2  --   MG  --  2.1  PHOS  --  4.1   Liver Function Tests: Recent Labs  Lab 01/09/21 2210  AST 25  ALT 19  ALKPHOS 62  BILITOT 0.7  PROT 7.0  ALBUMIN 3.9   No results for input(s): LIPASE, AMYLASE in the last 168 hours. No results for input(s): AMMONIA in the last 168 hours. CBC: Recent Labs  Lab 01/09/21 2210  WBC 9.8  HGB 11.8*  HCT 36.8  MCV 88.5  PLT 311   Cardiac Enzymes: No results for input(s): CKTOTAL, CKMB, CKMBINDEX, TROPONINI in the last 168 hours. BNP: BNP (last 3 results) Recent Labs    09/25/20 0924  BNP 106.8*    ProBNP (last 3 results) No results for input(s): PROBNP in the last 8760 hours.  CBG: No results for input(s): GLUCAP in the last 168 hours.     Signed:  Albertine Grates MD, PhD, FACP  [facility] 01/12/2021, 10:14 AM

## 2021-01-12 NOTE — Telephone Encounter (Addendum)
Called pt and LVM to reschedule her apt. Pt canceled today and rescheduled for Monday, but I will be out of the office until 10/21.

## 2021-01-12 NOTE — Progress Notes (Signed)
Pt stated having some epigastric pain 6/10. Pt given Carafate 1 GM. Pt stated needed dose daily instead of as needed. Will notify oncoming shift. Will monitor pt closely for further discomfort.

## 2021-01-12 NOTE — Telephone Encounter (Signed)
Patient rescheduled. She did ask if she could come Monday afternoon for someone to show them how to use BP cuff. I advised she can stop by. She might have to wait but someone will be here to help her.

## 2021-01-12 NOTE — TOC Benefit Eligibility Note (Addendum)
Patient Product/process development scientist completed.    The patient is currently admitted and upon discharge could be taking Xarelto 20 mg.  The current 30 day co-pay is, $4.00.   The patient is currently admitted and upon discharge could be taking Eliquis 5 mg.  The current 30 day co-pay is, $4.00.   The patient is insured through [place] Medicare Part D     Roland Earl, CPhT Pharmacy Patient Advocate Specialist [facility] Antimicrobial Stewardship Team Direct Number: [phone]  Fax: [phone]

## 2021-01-12 NOTE — Consult Note (Signed)
[facility] Inpatient Consult   01/12/2021  Jennifer [name] 1946-03-17 [id number]  [facility] Follow Up:  Spoke with patient to assess post hospital care coordination needs. Patient states that she remembers working with [name] and [name], [facility] CM health coach and care coordinator. States that she would like post hospital continued follow up.   Referral to [facility] CM RN case manager placed for post hospital care coordination and disease management services.  Of note, [facility] Care Management services does not replace or interfere with any services that are arranged by inpatient case management or social work.   Christophe Louis, MSN, RN [facility] Liaison Nurse Mobile Phone [phone]  Toll free office [phone]

## 2021-01-16 ENCOUNTER — Other Ambulatory Visit (HOSPITAL_COMMUNITY): Payer: Self-pay

## 2021-01-16 ENCOUNTER — Ambulatory Visit: Payer: Medicare Other

## 2021-01-16 NOTE — Telephone Encounter (Signed)
Patient said she was not feeling strong enough to come in today. She wanted to know if she could come in on Wednesday afternoon. Please advise

## 2021-01-16 NOTE — Telephone Encounter (Signed)
Pt will come Wednesday at 3:30pm to for BP cuff review.

## 2021-01-17 ENCOUNTER — Ambulatory Visit: Payer: Medicare Other | Admitting: Allergy & Immunology

## 2021-01-18 ENCOUNTER — Telehealth: Payer: Self-pay | Admitting: Internal Medicine

## 2021-01-18 ENCOUNTER — Encounter: Payer: Self-pay | Admitting: *Deleted

## 2021-01-18 ENCOUNTER — Other Ambulatory Visit: Payer: Self-pay | Admitting: *Deleted

## 2021-01-18 MED ORDER — NITROGLYCERIN 0.4 MG SL SUBL: 0.4000 mg | SUBLINGUAL_TABLET | SUBLINGUAL | 0 refills | Status: DC | PRN

## 2021-01-18 NOTE — Telephone Encounter (Signed)
Patient wanted to talk to [facility] about some of her medications.

## 2021-01-18 NOTE — Telephone Encounter (Signed)
Left a message for pt to call back

## 2021-01-18 NOTE — Telephone Encounter (Signed)
Pt called in with a couple of questions.  She wants to know if she can have a refill on NTG.  She reports that prior to last ED visit she was using NTG twice per day at times. She is no longer using medication often.  I advised her that if she has to use NTG often its a sign that she needs to be evaluated.  She verbalizes understanding. She also wants to know if it's okay to use a heating pad on her back.  I advised her that there is no reason she can't use a heating pad.  She thanked me for returning her call.

## 2021-01-18 NOTE — Patient Outreach (Signed)
[facility]) Care Management  01/18/2021  Jennifer TOKARCZYK 26-Apr-1945 [id number]   [facility] outreach to post hospital referred patient   Jennifer [name] was referred to [facility] on 01/12/21 by [facility] hospital liaison for post hospital services. Patient request that calls not be made to her until Wednesday- Friday, 10/19-10/21/22. She has medical appointments on Monday and Tuesday Per the referral   This patient was previously active with [facility] disease management/care guide services prior to her admission on 01/09/21   Insurance Medicare Medicaid of    Jennifer [name] is able to verify her HIPAA identifiers   Assessment Jennifer [name] reports she is doing fair today She reports continued back pain, weakness related to adjustment to her hypertension blood pressure medicines and ongoing chronic medical issues She cancelled her 2 appointments this week as she reports was too weak to attend them She has rescheduled and kept in contact with her providers via phone Adjusting to medicine changes and poly pharmacy are concerns voiced Has been working with one of the cardiology office pharmacist   Pcp to be seen on Monday 01/23/21 To see cardiology PA on 02/01/21 Dr Lalla Brothers to be seen 11/8 to evaluation for atrial fibrillation or source of atrial flutter  She reports she was informed during the last admission she had atrial flutter vs atrial fibrillation   Jennifer [name] is very knowledgeable about her medical problems  RN CM discussed with her how constipation, pain (back), and renal  (self cath intermittently retention) issues may lead to increase elevations on blood pressure (BP)  Back pain  No recent nerve block related to urinary infection on January 05 2021 It was postponed (Dr Allena Katz) Hx of an ablation Present pain management treatment plan - Extra strength tylenol and hydrocodone  Movement hurts her  Open to massage therapy or chiropractor services   Patient Active Problem List    Diagnosis Date Noted   Chest pain 01/10/2021   Hypertensive crisis 09/16/2020   Unstable angina (HCC)    Ischemic chest pain (HCC) 09/15/2020   Hypertensive emergency 09/15/2020   Hypertensive urgency 09/15/2020   Interstitial cystitis (chronic) without hematuria 09/15/2020   Chronic low back pain 09/15/2020   Chronic diastolic CHF (congestive heart failure) (HCC) 09/15/2020   Heart murmur 09/08/2020   Allergic rhinitis 08/17/2020   Altered bowel function 08/17/2020   Anxiety disorder 08/17/2020   Body mass index (BMI) 35.0-35.9, adult 08/17/2020   Chronic fatigue syndrome 08/17/2020   Colon cancer screening 08/17/2020   Functional diarrhea 08/17/2020   Hypothyroidism 08/17/2020   Intestinal gas excretion 08/17/2020   Localized obesity 08/17/2020   Nausea 08/17/2020   Polypharmacy 08/17/2020   Posttraumatic headache 08/17/2020   Posttraumatic vertigo 08/17/2020   Pure hypercholesterolemia 08/17/2020   Lower abdominal pain 08/17/2020   Scoliosis 08/17/2020   Sleep apnea 08/17/2020   Unspecified abnormal finding in specimens from other organs, systems and tissues 08/17/2020   Vitamin D deficiency 08/17/2020   Back pain 04/28/2020   Candidiasis 10/15/2019   Encounter for orthopedic follow-up care 08/12/2018   Vertigo 02/26/2018   Degenerative arthritis of right knee 02/19/2018   Urinary tract infection, site not specified 07/04/2017   Arthralgia of both knees 01/23/2017   Facet arthritis, degenerative, lumbar spine 01/23/2017   Benign paroxysmal positional vertigo 07/03/2016   IBS (irritable bowel syndrome) 04/19/2015   GERD (gastroesophageal reflux disease) 03/21/2015   Fibromyalgia 03/21/2015   Recurrent bacterial cystitis 03/21/2015   HTN (hypertension) 03/21/2015   Fibrositis 03/21/2015  SI (sacroiliac) pain 12/16/2014   DDD (degenerative disc disease), lumbar 05/11/2014   Facet arthropathy 05/11/2014   Spinal stenosis of lumbar region 05/11/2014   Past Medical  History:  Diagnosis Date   Acute meniscal tear of left knee    FOLLOWED BY DR GIAFFREY   BPPV (benign paroxysmal positional vertigo)    Chronic bladder pain    Chronic fatigue syndrome    Chronic low back pain    W/ RIGHT LEG PAIN AND NUMBNESS   Cystitis, chronic    Fibromyalgia    GERD (gastroesophageal reflux disease)    Hiatal hernia    Hypertension    IC (interstitial cystitis)    OSA (obstructive sleep apnea)    per pt study yrs ago-- moderate osa ,  intolerant cpap   Pinched vertebral nerve    bilateral L2 -- L3 and L3 -- L4-/  epidural injection's treatment , PT and pain clince   PONV (postoperative nausea and vomiting)    severe   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     aspirin EC 81 MG tablet Take 1 tablet (81 mg total) by mouth daily. Swallow whole. 90 tablet 3   atorvastatin (LIPITOR) 40 MG tablet Take 1 tablet (40 mg total) by mouth daily. 90 tablet 3   dextromethorphan-guaiFENesin (MUCINEX DM) 30-600 MG 12hr tablet Take 1 tablet by mouth every 12 (twelve) hours as needed for cough.     fexofenadine (ALLEGRA) 60 MG tablet Take 60 mg by mouth See admin instructions. Take 60 mg by mouth one to two times a day as needed for allergy symptoms     fluconazole (DIFLUCAN) 100 MG tablet TAKE 1 TABLET BY MOUTH EVERY DAY (Patient not taking: No sig reported) 21 tablet 1   fosfomycin (MONUROL) 3 g PACK Take 3 g by mouth every 3 (three) days for 6 days. 2 each 0   hydrALAZINE (APRESOLINE) 50 MG tablet Take 1 tablet (50 mg total) by mouth 3 (three) times daily. 405 tablet 3   HYDROcodone-acetaminophen  (NORCO) 7.5-325 MG tablet Take 1 tablet by mouth every 8 (eight) hours as needed for moderate pain or severe pain.     hydrocortisone (ANUSOL-HC) 25 MG suppository Place 25 mg rectally 2 (two) times daily as needed for hemorrhoids.     isosorbide dinitrate (ISORDIL) 10 MG tablet TAKE 1 TABLET BY MOUTH THREE TIMES A DAY 270 tablet 3   losartan (COZAAR) 100 MG tablet Take 100 mg by mouth daily.     meclizine (ANTIVERT) 25 MG tablet Take 25 mg by mouth 3 (three) times daily as needed for dizziness.     Meth-Hyo-M Bl-Benz Acd-Ph Sal 81.6 MG TABS Take 1 tablet (81.6 mg total) by mouth 2 (two) times daily as needed. (Patient not taking: Reported on 01/10/2021) 30 each 1   metoprolol tartrate (LOPRESSOR) 25 MG tablet Take 1 tablet (25 mg total) by mouth 2 (two) times daily. 60 tablet 0   nitroGLYCERIN (NITROSTAT) 0.4 MG SL tablet Place 1 tablet (0.4 mg total) under the tongue every 5 (  five) minutes as needed for chest pain. 20 tablet 1   nystatin cream (MYCOSTATIN) Apply topically 2 (two) times daily. 30 g 3   omeprazole (PRILOSEC) 20 MG capsule Take 20 mg by mouth See admin instructions. Take 20 mg by mouth in the morning one hour before breakfast and an additional 20 mg before evening meal as needed for GERD     ondansetron (ZOFRAN-ODT) 4 MG disintegrating tablet Take 4 mg by mouth every 8 (eight) hours as needed for nausea (dissolve orally).  1   promethazine (PHENERGAN) 25 MG tablet Take 1 tablet (25 mg total) by mouth every 6 (six) hours as needed for refractory nausea / vomiting. (Patient not taking: No sig reported) 30 tablet 0   rivaroxaban (XARELTO) 20 MG TABS tablet Take 1 tablet (20 mg total) by mouth daily with supper. 30 tablet 0   spironolactone (ALDACTONE) 25 MG tablet Take 1 tablet (25 mg total) by mouth daily. 90 tablet 3   sucralfate (CARAFATE) 1 G tablet Take 1 g by mouth See admin instructions. Take 1 gram by mouth in the morning and an additional 1 gram once a day for flares     No  current facility-administered medications on file prior to visit.    [facility] progression RN CM answered questions for Jennifer [name] about [facility] services and differences in providers  Plans Patient agrees to care plan and follow up within the next 30 business days    Yaziel Brandon L. [name], RN, BSN, CCM [facility] Telephonic Care Management Care Coordinator Office number [phone] [facility] number [phone] Fax number [phone]

## 2021-01-18 NOTE — Telephone Encounter (Signed)
Pt states she has back pain and can't make it in today. She will bring cuff to visit next week.

## 2021-01-23 NOTE — Progress Notes (Signed)
Patient ID: Jennifer [name]                 DOB: September 22, 1945                      MRN: [id number]     HPI:  (9/9 - visit with Efraim Kaufmann) Jennifer [name] is a 75 y.o. female referred by Dr. [name] to HTN clinic. PMH is significant for HTN, CAD with 90% distal LAD, PVC, HLD, fibromyalgia, chronic pain syndrome with lumbar stenosis. Most recent ECHO on 12/2020 showed EF 60-65% with severe concentric LVH  On 09/15/2020, patient was sent to ED by Dr. [name] due to chest discomfort and HTN urgency (BP 246/86) with chest pain, SOB, and headaches and was admitted to the hospital 6/16 - 09/27/2020. 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 HTN medications that the patient was discharged with include hydralazine 25mg  TID, doxazosin 1mg  daily and spironolactone 25mg  daily. Was on amlodipine 10mg  daily during hospital stay but was discharged on nifedipine but was stopped due to concern over possible allergy.   Patient was seen by me in the HTN clinic on 12/09/20. Hydralazine was decreased to 50mg  TID due to complaints that it made her heart pound. Doxazosin was discontinued due to concerns it was contributing to her weakness. Finally ranolazine was discontinued due to heart pounding and constipation.  She was given a blood pressure cuff and taught how to use it, but she later called and stated she couldn't get it to work.   On 10/10-13/2022, patient was hospitalized with chest pain, SOB and palpitations and was hospitalized from 10/11-13  She reported that her SBP the previous day was SBP 220 with no relief from 2 x SL nitro and called EMS. She was diagnosed with new onset atrial flutter with elevated troponin likely due to demand ischemia and pt was discharged on lopressor, Xarelto, and fosfomycin for cystitis flare-up while admitted.   Patient presents today for follow up. She did bring in her BP cuff. It appears the  batteries in the cuff are no good. Replaced the batteries and it now works fine. Reviewed again how to use it. Patient reports feeling better after her medication changes. She has a lot of questions about a flutter, her new medications and nitroglycerin. She reports taking nitroglycerin for a dull pain in her chest. Got a refill on NTG 10/19 and has 3 left. She has chronic back pain and fibromyalgia. Thinks her atorvastatin is contributing to her pain/weakness.   Current HTN meds: metoprolol 25 mg twice a day, isosorbide dinitrate 10 mg TID, hydralazine 50 mg TID, spironolactone 25 mg daily, losartan 100 mg daily,  Previously tried: Bystolic (sinus pause with Mobitz Type 1), HCTZ (headaches), amlodipine (only had during hospitalization 6/16 - 09/27/2020) BP goal: <130/80   Family History: The patient's family history includes Alzheimer's disease in her father; Asthma in her father; CAD in her father; COPD in her father; Cancer in her brother; Colonic polyp in her mother; Emphysema in her father; Heart failure in her mother; Hypertension in her father and mother; Peripheral vascular disease in her mother; Stroke in her mother.   Social History: Lives alone.   Diet: Caffeine: drinks 2 glasses of tea/day.   Exercise: not assess at todays visit    Home BP readings:  none  Wt Readings from Last 3 Encounters:  01/09/21 209 lb  7 oz (95 kg)  11/03/20 191 lb (86.6 kg)  10/19/20 194 lb 8.9 oz (88.3 kg)   BP Readings from Last 3 Encounters:  01/12/21 111/64  12/09/20 (!) 154/70  11/03/20 130/73   Pulse Readings from Last 3 Encounters:  01/11/21 65  12/09/20 83  11/03/20 88    Renal function: Estimated Creatinine Clearance: 63.6 mL/min (by C-G formula based on SCr of 0.88 mg/dL).  Past Medical History:  Diagnosis Date   Acute meniscal tear of left knee    FOLLOWED BY DR GIAFFREY   BPPV (benign paroxysmal positional vertigo)    Chronic bladder pain    Chronic fatigue syndrome    Chronic  low back pain    W/ RIGHT LEG PAIN AND NUMBNESS   Cystitis, chronic    Fibromyalgia    GERD (gastroesophageal reflux disease)    Hiatal hernia    Hypertension    IC (interstitial cystitis)    OSA (obstructive sleep apnea)    per pt study yrs ago-- moderate osa ,  intolerant cpap   Pinched vertebral nerve    bilateral L2 -- L3 and L3 -- L4-/  epidural injection's treatment , PT and pain clince   PONV (postoperative nausea and vomiting)    severe   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     aspirin EC 81 MG tablet Take 1 tablet (81 mg total) by mouth daily. Swallow whole. 90 tablet 3   atorvastatin (LIPITOR) 40 MG tablet Take 1 tablet (40 mg total) by mouth daily. 90 tablet 3   dextromethorphan-guaiFENesin (MUCINEX DM) 30-600 MG 12hr tablet Take 1 tablet by mouth every 12 (twelve) hours as needed for cough.     fexofenadine (ALLEGRA) 60 MG tablet Take 60 mg by mouth See admin instructions. Take 60 mg by mouth one to two times a day as needed for allergy symptoms     fluconazole (DIFLUCAN) 100 MG tablet TAKE 1 TABLET BY MOUTH EVERY DAY (Patient not taking: No sig reported) 21 tablet 1   hydrALAZINE (APRESOLINE) 50 MG tablet Take 1 tablet (50 mg total) by mouth 3 (three) times daily. 405 tablet 3   HYDROcodone-acetaminophen (NORCO) 7.5-325 MG tablet Take 1 tablet by mouth every 8 (eight) hours as needed for moderate pain or severe pain.     hydrocortisone (ANUSOL-HC) 25 MG suppository Place 25 mg rectally 2 (two) times daily as needed for hemorrhoids.     isosorbide dinitrate (ISORDIL) 10 MG tablet TAKE 1  TABLET BY MOUTH THREE TIMES A DAY 270 tablet 3   losartan (COZAAR) 100 MG tablet Take 100 mg by mouth daily.     meclizine (ANTIVERT) 25 MG tablet Take 25 mg by mouth 3 (three) times daily as needed for dizziness.     Meth-Hyo-M Bl-Benz Acd-Ph Sal 81.6 MG TABS Take 1 tablet (81.6 mg total) by mouth 2 (two) times daily as needed. (Patient not taking: Reported on 01/10/2021) 30 each 1   metoprolol tartrate (LOPRESSOR) 25 MG tablet Take 1 tablet (25 mg total) by mouth 2 (  two) times daily. 60 tablet 0   nitroGLYCERIN (NITROSTAT) 0.4 MG SL tablet Place 1 tablet (0.4 mg total) under the tongue every 5 (five) minutes as needed for chest pain. Take up to 3 tablets 25 tablet 0   nystatin cream (MYCOSTATIN) Apply topically 2 (two) times daily. 30 g 3   omeprazole (PRILOSEC) 20 MG capsule Take 20 mg by mouth See admin instructions. Take 20 mg by mouth in the morning one hour before breakfast and an additional 20 mg before evening meal as needed for GERD     ondansetron (ZOFRAN-ODT) 4 MG disintegrating tablet Take 4 mg by mouth every 8 (eight) hours as needed for nausea (dissolve orally).  1   promethazine (PHENERGAN) 25 MG tablet Take 1 tablet (25 mg total) by mouth every 6 (six) hours as needed for refractory nausea / vomiting. (Patient not taking: No sig reported) 30 tablet 0   rivaroxaban (XARELTO) 20 MG TABS tablet Take 1 tablet (20 mg total) by mouth daily with supper. 30 tablet 0   spironolactone (ALDACTONE) 25 MG tablet Take 1 tablet (25 mg total) by mouth daily. 90 tablet 3   sucralfate (CARAFATE) 1 G tablet Take 1 g by mouth See admin instructions. Take 1 gram by mouth in the morning and an additional 1 gram once a day for flares     No current facility-administered medications on file prior to visit.    Allergies  Allergen Reactions   Moxifloxacin Hives, Shortness Of Breath, Swelling and Rash    Rash was severe, caused tremors, ans skin became BLOODY RED   Quinolones Anaphylaxis   Sulfa  Antibiotics Hives and Rash   Furosemide Other (See Comments)    CANNOT TAKE DUE TO Interstitial cystitis   Aspirin Nausea Only and Other (See Comments)    GI and stomach upset   Duloxetine Hcl Nausea Only and Other (See Comments)    Vertigo and heart racing also    Hydrochlorothiazide Other (See Comments)    Headaches    Ibuprofen Nausea Only and Other (See Comments)    GI upset   Other Other (See Comments)   Robaxin [Methocarbamol] Other (See Comments)    "Make me feel flu-like symptoms"   Topiramate Itching   Zofran [Ondansetron Hcl] Other (See Comments)    Headaches    Codeine Hives, Nausea And Vomiting and Rash   Dimethyl Sulfoxide Hives and Itching   Macrodantin [Nitrofurantoin Macrocrystal] Itching, Nausea And Vomiting and Rash    Abdominal and chest pain, also- blood-red skin, also   Nitrofurantoin Diarrhea, Itching, Rash and Other (See Comments)    GI/stomach upset, sweating, chest pain, and skin turned bloody red   Tape Rash   Yellow Dye Rash    There were no vitals taken for this visit.   Assessment/Plan:  1. Hypertension - Blood pressure today is well controlled, at goal of <130/80. I did get a manual reading in the 90's systolic. I have asked the patient to continue her home regimen of metoprolol 25 mg twice a day, isosorbide dinitrate 10 mg TID, hydralazine 50 mg TID, spironolactone 25 mg daily, losartan 100 mg daily. Check blood pressure 2 times twice a day. She is to bring those readings in when she see's Dayna [name] in 1 week.  I will see her back in clinic if Dayna feels follow up is needed.  2. HLD- LDL is well controlled on atorvastatin 40mg  daily. However patient thinks it is making her weakness worse. Will hold for  2 weeks. If weakness/pain improves, will plan to switch to rosuvastatin.   Thank you  Olene Floss, Pharm.D, BCPS, CPP [facility]  [address], [place], [place] [address]  Phone: [phone]; Fax: [phone]

## 2021-01-24 ENCOUNTER — Ambulatory Visit (INDEPENDENT_AMBULATORY_CARE_PROVIDER_SITE_OTHER): Payer: Medicare Other | Admitting: Pharmacist

## 2021-01-24 ENCOUNTER — Telehealth (HOSPITAL_COMMUNITY): Payer: Self-pay | Admitting: Pharmacist

## 2021-01-24 ENCOUNTER — Other Ambulatory Visit: Payer: Self-pay

## 2021-01-24 ENCOUNTER — Other Ambulatory Visit (HOSPITAL_BASED_OUTPATIENT_CLINIC_OR_DEPARTMENT_OTHER): Payer: Self-pay

## 2021-01-24 VITALS — BP 98/52 | HR 61

## 2021-01-24 DIAGNOSIS — I10 Essential (primary) hypertension: Secondary | ICD-10-CM

## 2021-01-24 NOTE — Patient Instructions (Addendum)
It was nice to see you today. Your BP is  goal of <130/80.    How to use a home blood pressure monitor  Wait at least an hour after taking morning BP medications. Don't drink caffeinated beverages or exercise within 30 minutes before measuring your blood pressure. Rest at least for 5 minutes. Be still. Sit with your back straight and supported (on a dining chair, rather than a sofa). Your feet should be flat on the floor and your legs should not be crossed. Your arm should be supported on a flat surface (such as a table) with the upper arm at heart level. Make sure the bottom of the cuff is placed directly above the bend of the elbow.  Press start. Measure at least twice a day. If you get a high reading take another reading a few minutes apart and record the results.  HOLD atorvastatin 40 mg for 2 weeks to see if muscle pain and weakness improve.  CONTINUE metoprolol 25 mg twice a day, isosorbide dinitrate 10 mg three times a day, hydralazine 50 mg three times a day, spironolactone 25 mg daily, losartan 100 mg daily.  Take your nitroglycerin for chest pain or tightness that feels like pressure   Continue monitoring your blood pressure at home and bring in your log to your next visit.   Aim to consume less than 2 grams of sodium per day. Aim to get at least 150 minutes total per week of aerobic activity (e.g. brisk walk).    Follow up with Dr. Shea Evans on 11/2 and bring in your BP readings. Call in 2 weeks to see if muscle pain has improved after stopping atorvastatin.

## 2021-01-24 NOTE — Telephone Encounter (Signed)
LVM

## 2021-01-26 ENCOUNTER — Other Ambulatory Visit (HOSPITAL_COMMUNITY): Payer: Self-pay

## 2021-01-26 ENCOUNTER — Telehealth (HOSPITAL_COMMUNITY): Payer: Self-pay | Admitting: Pharmacist

## 2021-01-26 NOTE — Telephone Encounter (Signed)
Patient doing well.  Reviewed all medications and possible side effects.  No refills to transfer.

## 2021-01-27 ENCOUNTER — Other Ambulatory Visit: Payer: Self-pay | Admitting: *Deleted

## 2021-01-27 ENCOUNTER — Ambulatory Visit: Payer: Self-pay | Admitting: *Deleted

## 2021-01-27 NOTE — Patient Outreach (Signed)
[facility]) Care Management  01/27/2021  Jennifer [name] [birth year] [id number]   [facility] Care coordination-rescheduled outreach  Ms chrissy ealey to reschedule her outreach appointment x 2 01/27/21 changed to 01/30/21 then pending RN CM left a message for her with RN CM availability for reschedule   Plan [facility] RN CM will follow up with patient within the next 7-14 business days    Stanley Helmuth L. [name], RN, BSN, CCM [facility] Telephonic Care Management Care Coordinator Office number [phone] Mobile number [phone]  Main [facility] number [phone] Fax number [phone]

## 2021-01-30 ENCOUNTER — Encounter: Payer: Self-pay | Admitting: Physician Assistant

## 2021-01-30 ENCOUNTER — Ambulatory Visit: Payer: Self-pay | Admitting: *Deleted
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
## 2021-02-01 ENCOUNTER — Telehealth: Payer: Self-pay | Admitting: Physician Assistant

## 2021-02-01 ENCOUNTER — Other Ambulatory Visit: Payer: Self-pay

## 2021-02-01 ENCOUNTER — Ambulatory Visit (INDEPENDENT_AMBULATORY_CARE_PROVIDER_SITE_OTHER): Payer: Medicare Other | Admitting: Physician Assistant

## 2021-02-01 ENCOUNTER — Encounter: Payer: Self-pay | Admitting: Physician Assistant

## 2021-02-01 VITALS — BP 110/58 | HR 62 | Ht 64.0 in | Wt 191.2 lb

## 2021-02-01 DIAGNOSIS — I350 Nonrheumatic aortic (valve) stenosis: Secondary | ICD-10-CM

## 2021-02-01 DIAGNOSIS — I10 Essential (primary) hypertension: Secondary | ICD-10-CM

## 2021-02-01 DIAGNOSIS — I441 Atrioventricular block, second degree: Secondary | ICD-10-CM | POA: Diagnosis not present

## 2021-02-01 DIAGNOSIS — I4892 Unspecified atrial flutter: Secondary | ICD-10-CM | POA: Diagnosis not present

## 2021-02-01 DIAGNOSIS — I2510 Atherosclerotic heart disease of native coronary artery without angina pectoris: Secondary | ICD-10-CM | POA: Diagnosis not present

## 2021-02-01 MED ORDER — APIXABAN 5 MG PO TABS: 5.0000 mg | ORAL_TABLET | Freq: Two times a day (BID) | ORAL | 11 refills | Status: DC

## 2021-02-01 MED ORDER — LOSARTAN POTASSIUM 50 MG PO TABS: 50.0000 mg | ORAL_TABLET | Freq: Every day | ORAL | 3 refills | Status: DC

## 2021-02-01 NOTE — Telephone Encounter (Signed)
   Please let Ms. Fryman know I'd like her to return for a CBC, BMET, and CK level (does not have to be fasting). I was doing an in-depth review of her recent labs post-visit and initially thought her blood count had been followed up while in the hospital - but given her recent tendency for more low-normal BPs and chronic back pain, would like to obtain to ensure stable. As we talked about I'd also encourage her to be in touch with her pain management team about her back pain. Thanks. Aika Brzoska

## 2021-02-01 NOTE — Patient Instructions (Addendum)
Medication Instructions:  Your physician has recommended you make the following change in your medication:   STOP Xarelto  START Eliquis 5 mg taking 1 tablet twice a day   REDUCE the Losartan to 50 mg taking 1 daily  *If you need a refill on your cardiac medications before your next appointment, please call your pharmacy*   Lab Work: None ordered  If you have labs (blood work) drawn today and your tests are completely normal, you will receive your results only by: MyChart Message (if you have MyChart) OR A paper copy in the mail If you have any lab test that is abnormal or we need to change your treatment, we will call you to review the results.   Testing/Procedures: None ordered   Follow-Up: At [facility], you and your health needs are our priority.  As part of our continuing mission to provide you with exceptional heart care, we have created designated Provider Care Teams.  These Care Teams include your primary Cardiologist (physician) and Advanced Practice Providers (APPs -  Physician Assistants and Nurse Practitioners) who all work together to provide you with the care you need, when you need it.  We recommend signing up for the patient portal called "MyChart".  Sign up information is provided on this After Visit Summary.  MyChart is used to connect with patients for Virtual Visits (Telemedicine).  Patients are able to view lab/test results, encounter notes, upcoming appointments, etc.  Non-urgent messages can be sent to your provider as well.   To learn more about what you can do with MyChart, go to [email or web address].    Your next appointment:   As scheduled   The format for your next appointment:   In Person  Provider:   You may see Christell Constant, MD or one of the following Advanced Practice Providers on your designated Care Team:   Ronie Spies, PA-C Jacolyn Reedy, PA-C   Other Instructions

## 2021-02-02 ENCOUNTER — Encounter: Payer: Self-pay | Admitting: *Deleted

## 2021-02-02 ENCOUNTER — Other Ambulatory Visit: Payer: Self-pay | Admitting: *Deleted

## 2021-02-02 NOTE — Telephone Encounter (Signed)
Returned call to the pt.  She will come in one day for lab work.  She will call me back and let me know what day she can come in.  When pt calls back, please just schedule for lab work as orders are already put in the system

## 2021-02-02 NOTE — Telephone Encounter (Signed)
Patient returned call

## 2021-02-02 NOTE — Patient Outreach (Signed)
[facility]) Care Management  02/02/2021  Jennifer [name] July 07, 1945 [id number]   [facility] Care coordination  [name] called and left a message requesting a return call  RN CM returned a call to her  Rescheduled appointment time provided by patient She is doing okay today as reported, some social concerns (she denies assistance is needed)  Plan [facility] RN CM will follow up with patient within the next 30 business days    [name] L. [name], RN, BSN, CCM [facility] Telephonic Care Management Care Coordinator Office number [phone] Mobile number [phone]  Main [facility] number [phone] Fax number [phone]

## 2021-02-02 NOTE — Telephone Encounter (Signed)
Call placed to pt regarding the message below.  Left a message for her to return my call.

## 2021-02-02 NOTE — Telephone Encounter (Signed)
Patient states she will call back tomorrow to schedule her lab work. She says she has not heard back yet on whether she will be able to make it Monday, but should hear something by tomorrow morning. She requested a message be sent to inform Victorino Dike.

## 2021-02-03 NOTE — Telephone Encounter (Signed)
Lab is scheduled for 11/8 ./cy

## 2021-02-06 ENCOUNTER — Telehealth: Payer: Self-pay | Admitting: Internal Medicine

## 2021-02-06 MED ORDER — METOPROLOL TARTRATE 25 MG PO TABS: 25.0000 mg | ORAL_TABLET | Freq: Two times a day (BID) | ORAL | 11 refills | Status: DC

## 2021-02-06 NOTE — Telephone Encounter (Signed)
*  STAT* If patient is at the pharmacy, call can be transferred to refill team.   1. Which medications need to be refilled? (please list name of each medication and dose if known) metoprolol tartrate (LOPRESSOR) 25 MG tablet  2. Which pharmacy/location (including street and city if local pharmacy) is medication to be sent to? [id number] - [place], [place] - [address]  3. Do they need a 30 day or 90 day supply? 90 day supply   

## 2021-02-06 NOTE — Telephone Encounter (Signed)
Pt had recent office visit, refill sent for Lopressor 25 bid, a 1 year supply[facility].

## 2021-02-07 ENCOUNTER — Other Ambulatory Visit: Payer: Medicare Other

## 2021-02-07 ENCOUNTER — Institutional Professional Consult (permissible substitution): Payer: Medicare Other | Admitting: Cardiology

## 2021-02-08 ENCOUNTER — Other Ambulatory Visit: Payer: Medicare Other

## 2021-02-08 ENCOUNTER — Telehealth: Payer: Self-pay | Admitting: Pharmacist

## 2021-02-08 NOTE — Telephone Encounter (Signed)
Patient called asking if she can take mucinex DM extra srength. Advised this was ok. She also said her back hurts too much to come get labs. She is seeing PA at [facility] next week 11/16 asking if she can get labs there. Advised it was ok. Jennifer [name] wants a CK, CBC and BMP. Advised she request her PCP order these labs.

## 2021-02-08 NOTE — Telephone Encounter (Signed)
Noted  

## 2021-02-10 ENCOUNTER — Telehealth: Payer: Self-pay | Admitting: Internal Medicine

## 2021-02-10 MED ORDER — METOPROLOL TARTRATE 25 MG PO TABS: 25.0000 mg | ORAL_TABLET | Freq: Two times a day (BID) | ORAL | 2 refills | Status: DC

## 2021-02-10 NOTE — Telephone Encounter (Signed)
Pt wanted 90 day supply and only received 30 day .Script resent .[name]

## 2021-02-10 NOTE — Telephone Encounter (Signed)
Pt c/o medication issue:  1. Name of Medication: metoprolol tartrate (LOPRESSOR) 25 MG tablet  2. How are you currently taking this medication (dosage and times per day)? Take 1 tablet (25 mg total) by mouth 2 (two) times daily.  3. Are you having a reaction (difficulty breathing--STAT)? no  4. What is your medication issue? Patient is calling in with question bout this medication

## 2021-02-13 ENCOUNTER — Encounter: Payer: Self-pay | Admitting: *Deleted

## 2021-02-13 ENCOUNTER — Other Ambulatory Visit: Payer: Self-pay | Admitting: *Deleted

## 2021-02-13 ENCOUNTER — Other Ambulatory Visit: Payer: Self-pay

## 2021-02-13 DIAGNOSIS — G9332 Myalgic encephalomyelitis/chronic fatigue syndrome: Secondary | ICD-10-CM

## 2021-02-13 DIAGNOSIS — M545 Low back pain, unspecified: Secondary | ICD-10-CM

## 2021-02-13 DIAGNOSIS — M797 Fibromyalgia: Secondary | ICD-10-CM

## 2021-02-13 DIAGNOSIS — I2510 Atherosclerotic heart disease of native coronary artery without angina pectoris: Secondary | ICD-10-CM | POA: Insufficient documentation

## 2021-02-13 DIAGNOSIS — G8929 Other chronic pain: Secondary | ICD-10-CM

## 2021-02-13 DIAGNOSIS — I359 Nonrheumatic aortic valve disorder, unspecified: Secondary | ICD-10-CM | POA: Insufficient documentation

## 2021-02-13 NOTE — Patient Outreach (Addendum)
[facility]) Care Management  02/13/2021  Jennifer [name] 05/13/1945 [id number]  [facility] outreach to complex care patient  Ms Jennifer [name] was referred to [facility] on 01/12/21 for post hospital services by [facility] hospital liaison   Insurance Medicare part [place], Medicaid of [place], Cigna secure part Jennifer medication program  Care Plan : Nonspecific Low Back Pain (Adult)  Updates made by Jennifer Faster, RN since 02/14/2021 12:00 AM     Problem: Chronic Low Back Pain Resolved 02/13/2021  Priority: High  Onset Date: 01/18/2021     Long-Range Goal: Chronic Low Back Pain Managed Completed 02/13/2021  Start Date: 01/18/2021  Expected End Date: 03/31/2021  This Visit's Progress: Not on track  Priority: High  Note:      Task: Partner to Develop Chronic Low Back Pain Plan Completed 02/13/2021  Due Date: 03/31/2021  Note:   Care Management Activities:   02/13/21 continues in pain reports not doing well Questions use of her Statins and anticoagulant for side effects----Resolving due to duplicate goal [id number]- careful application of heat or ice encouraged - complementary therapy use encouraged - medication side effects managed - mutually acceptable comfort goal set - pain assessed - participation in rehabilitation therapy encouraged    Notes:     Care Plan : [place] of Care  Updates made by Jennifer Faster, RN since 02/14/2021 12:00 AM     Problem: Complex Care Coordination Needs and disease management in patient with Chronic pain   Priority: High     Long-Range Goal: Establish Plan of Care for Management Complex SDOH Barriers, disease management and Care Coordination Needs in patient with Chronic pain   Start Date: 02/13/2021  This Visit's Progress: Not on track  Priority: High  Note:   Current Barriers:  Knowledge Deficits related to plan of care for management of chronic pain Care Coordination needs related to Transportation, Limited education about  chronic pain*, and Lacks knowledge of community resource: transportation (non medical, non [facility]) Transportation barriers 02/13/21 Patient reports symptoms of "heart pounding", "so much pain in the last three days in her back and buttocks with extreme" intervals of loose stools and difficulty sitting up She provides RN CM details of her medical history leading to these symptoms. RN CM allowed her to ventilate and was not at times able to interject inquiries. She questions if her pain is related to use of Xarelto and Eliquis side effects.  She reports discussing this with Jennifer [name] on 02/01/21 and Jennifer [name] the [facility] care pharmacy staff. She has samples of Eliquis as her xarelto was discontinued. She reports a long history of rare side effects to medications. She denies any continued bleeding side effects, only joint and muscle pain plus streak of blood when blew nose. She inquires about coverage of complementary services to include acupuncture and chiropractor services. She does not have access to a computer/internet therefore information searched and discussed with her . She is scheduled to follow up with her pcp on 02/15/21 and urology on 02/16/21 but continues to report transportation concerns to non [facility] providers and to complete errands. She has been using [employer] to rent a car when unable to use [facility] transportation services Denies anxiety is the cause of her pain but reports the pain is causing her anxiety   RN CM Clinical Goal(s):  Patient will verbalize understanding of plan for management of chronic pain work with community resource care guide to address needs related to  Transportation non medical,  non [facility]  through collaboration with RN Care manager, provider, and care team.   Interventions: Outreaches to re assess needs Will refer to [facility] care guide Inter-disciplinary care team collaboration (see longitudinal plan of care) Evaluation of current treatment plan related to  self management and  patient's adherence to plan as established by provider  Pain Interventions: Pain assessment performed Medications reviewed Reviewed provider established plan for pain management; Discussed importance of adherence to all scheduled medical appointments; Counseled on the importance of reporting any/all new or changed pain symptoms or management strategies to pain management provider; Discussed use of relaxation techniques and/or diversional activities to assist with pain reduction (distraction, imagery, relaxation, massage, acupressure, TENS, heat, and cold application; Reviewed with patient prescribed pharmacological and nonpharmacological pain relief strategies; Screening for signs and symptoms of depression related to chronic disease state;  Assessed social determinant of health barriers;  Discussed with her the availability for her to go to a [facility] pharmacy for medication refill for cost efficiency. Discussed with her that her medicaid generally covers chiropractic services. Provided patient with address and contact number for [place] she voiced interest in seeing and encouraged her to outreach Discussed massage, acupuncture, heat, cold , PT. Side effects of Eliquis and Xarelto discussed in details to include bleeding, fatigue, joint pain and muscle weakness in which she was most interested in. Xarelto more common side effect back pain, rare incidence joint or muscle pain. Educated her on sciatica pain differences in muscle, bone, nerve pain. Referred back to pain management. Discussed bleeding side effects (gum, stools, nose urine, sputum etc)  Educated on [facility] lab  Patient Goals/Self-Care Activities: Patient will self administer medications as prescribed Patient will attend all scheduled provider appointments Patient will call pharmacy for medication refills Patient will continue to perform ADL's independently Patient will continue to perform IADL's independently Patient  will call provider office for new concerns or questions Patient will follow her pain management treatment plan (Hydrocodone and nerve blocks when not on antibiotics-next scheduled 04/14/2021)per Dr Posey Pronto (last seen per patient on 11/25/20) and outreach to providers as needed  Follow Up Plan:  The patient has been provided with contact information for the care management team and has been advised to call with any health related questions or concerns.  The care management team will reach out to the patient again over the next 7-10 business days.       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 09/15/2020   Chronic low back pain 09/15/2020   Chronic diastolic CHF (congestive heart failure) ([place]) 09/15/2020   Heart murmur 09/08/2020   Allergic rhinitis 08/17/2020   Altered bowel function 08/17/2020   Anxiety disorder 08/17/2020   Body mass index (BMI) 35.0-35.9, adult 08/17/2020   Chronic fatigue syndrome 08/17/2020   Colon cancer screening 08/17/2020   Functional diarrhea 08/17/2020   Hypothyroidism 08/17/2020   Intestinal gas excretion 08/17/2020   Localized obesity 08/17/2020   Nausea 08/17/2020   Polypharmacy 08/17/2020   Posttraumatic headache 08/17/2020   Posttraumatic vertigo 08/17/2020   Pure hypercholesterolemia 08/17/2020   Lower abdominal pain 08/17/2020  Scoliosis 08/17/2020   Sleep apnea 08/17/2020   Unspecified abnormal finding in specimens from other organs, systems and tissues 08/17/2020   Vitamin Jennifer deficiency 08/17/2020   Back pain 04/28/2020    Candidiasis 10/15/2019   Encounter for orthopedic follow-up care 08/12/2018   Vertigo 02/26/2018   Degenerative arthritis of right knee 02/19/2018   Urinary tract infection, site not specified 07/04/2017   Arthralgia of both knees 01/23/2017   Facet arthritis, degenerative, lumbar spine 01/23/2017   Benign paroxysmal positional vertigo 07/03/2016   IBS (irritable bowel syndrome) 04/19/2015   GERD (gastroesophageal reflux disease) 03/21/2015   Fibromyalgia 03/21/2015   Recurrent bacterial cystitis 03/21/2015   HTN (hypertension) 03/21/2015   Fibrositis 03/21/2015   SI (sacroiliac) pain 12/16/2014   DDD (degenerative disc disease), lumbar 05/11/2014   Facet arthropathy 05/11/2014   Spinal stenosis of lumbar region 05/11/2014    Cala Bradford L. [name], RN, BSN, CCM [facility] Telephonic Care Management Care Coordinator Office number [phone] Main [facility] number [phone] Fax number [phone]

## 2021-02-14 ENCOUNTER — Other Ambulatory Visit: Payer: Self-pay | Admitting: *Deleted

## 2021-02-14 NOTE — Patient Outreach (Signed)
[facility]) Care Management  02/14/2021  AVEA MCGOWEN 1945-10-13 [id number]   Outreach to patient Updated patient on outreach to [facility] Maralyn Sago) to state Medicare would be accepted not medicaid and there is a $160 new patient visit charge to include an initial xray  Updated her of an attempt to reach [facility] transportation x 1 and referral to [facility] care guide She voiced understanding   Jamonte Curfman L. [name], RN, BSN, CCM [facility] Telephonic Care Management Care Coordinator Office number [phone] Mobile number [phone]  Main [facility] number [phone] Fax number [phone]

## 2021-02-14 NOTE — Patient Outreach (Signed)
[facility]) Care Management  02/14/2021  Jennifer [name] [birth year] [id number]   [facility] Care coordination- provider coverage  Outreach to [facility] [phone] to inquire about availability to see medicare/medicaid patients No answer. [facility] RN CM left HIPAA [facility] Portability and Accountability Act) compliant voicemail message along with CM's contact info at the preferred number indicated in EPIC   Plan [facility] RN CM will follow up with patient within the next 7-10 business days    Allianna Beaubien L. [name], RN, BSN, CCM [facility] Telephonic Care Management Care Coordinator Office number [phone] Mobile number [phone]  Main [facility] number [phone] Fax number [phone]

## 2021-02-15 ENCOUNTER — Telehealth: Payer: Self-pay

## 2021-02-15 NOTE — Telephone Encounter (Signed)
   Telephone encounter was:  Successful.  02/15/2021 Name: [name] MRN: [id number] DOB: 1945-06-07  Jennifer [name] is a 75 y.o. year old female who is a primary care patient of Jarrett [place], [place] . The community resource team was consulted for assistance with Transportation Needs   Care guide performed the following interventions: Spoke with patient about Administrator, Civil Service and [employer] as transportation options.  Patient stated she was already aware of these options and has the paperwork which she plans on completing.  She also has the contact information for Medicaid transportation.  Follow Up Plan:  No further follow up planned at this time. The patient has been provided with needed resources.  Haitham Dolinsky, AAS Paralegal, [facility] Care Guide  Embedded Care Coordination [place]  Care Management  [address] [place], [place] [address] [email or web address]  ?? [phone]   [email or web address]

## 2021-02-17 ENCOUNTER — Telehealth: Payer: Self-pay | Admitting: Pharmacist

## 2021-02-17 NOTE — Telephone Encounter (Signed)
Patient called back. She wanted to know if she could get labs done on Tues when she sees me. Did not make it to her PCP office. I advised that was fine

## 2021-02-17 NOTE — Telephone Encounter (Signed)
Patient called yesterday (11/17) and left VM for me to call her back. Called pt back today. No answer, LVM.

## 2021-02-21 ENCOUNTER — Other Ambulatory Visit: Payer: Self-pay | Admitting: *Deleted

## 2021-02-21 ENCOUNTER — Other Ambulatory Visit: Payer: Medicare Other

## 2021-02-21 ENCOUNTER — Telehealth: Payer: Self-pay | Admitting: Pharmacist

## 2021-02-21 ENCOUNTER — Ambulatory Visit: Payer: Medicare Other

## 2021-02-21 ENCOUNTER — Telehealth: Payer: Medicare Other

## 2021-02-21 NOTE — Patient Outreach (Signed)
[facility]) Care Management  02/21/2021  DAWSON HOLLMAN 1945/06/17 [id number]   [facility] Care coordination- attempt to return call to patient  Received a message from patient left at 0902 02/21/21 requesting a return cal Returned a call to her No answer. [facility] RN CM left HIPAA [facility] Portability and Accountability Act) compliant voicemail message along with CM's contact info at the preferred number indicated in EPIC   Plan [facility] RN CM will follow up with patient within the next 7-14 business days   Diyari Cherne L. [name], RN, BSN, CCM [facility] Telephonic Care Management Care Coordinator Office number [phone] Mobile number [phone]  Main [facility] number [phone] Fax number [phone]

## 2021-02-21 NOTE — Telephone Encounter (Signed)
Spoke with patient. She states that she has bad excruciating back pain-leg pain since starting Xarelto.  Has hx of lumbar stenosis and chronic back pain but states its 20 x worse since starting first Xarelto and now Eliquis. She had a nerve block scheduled but it was canceled due to UTI. She has apt with pain management PA Dec 2 to get her hydrocodone rx refilled. Take 2-3 doses of hydrocodone just to be able to function. Since starting Eliquis she has also had flare of GERD, nausea. Also complains her urine stream is slower. She quotes the package inserts that state to call your doctor if you have back pain, also the reports of nausea and GERD. She already take omeprazole and sulfate plus gaviscon.  I gingerly tried to discuss with patient that she does have a hx of chronic back pain and the reports on back pain with Eliquis and Xarelto are very low and there is no comparison to the rates of placebo. These warning have more to do with adverse events that can occur due to the reasons you are on a blood thinner, than due to the blood thinner itself. Advised that warfarin is not a good option for her. Pradaxa has higher rates of dyspepsia but I could discuss with Dr. Richarda Blade or Kriste Basque trying this instead. I did tell her that I am not convinced her increased pain is from Xarelto or Eliquis.  In the mean time, will increase omeprazole to BID and suclfate BID. She reports pounding of heart when she stands up- has a lot of pain when she does this. Thinks its from metoprolol. HR and pounding calms down when she sits. I advised that this pounding is most likely due to pain and not her medication.

## 2021-02-21 NOTE — Telephone Encounter (Signed)
Patients telephone call visit was canceled. However, patient had just called that day to move her in person visit to telephone and cancel message had to do with labs. I called patient to make sure that the canceling of apt was not a mistake. No answer so I left a VM.

## 2021-02-22 NOTE — Telephone Encounter (Signed)
Agree with plan. Also would encourage the labs we recommended when she has the chance. If pt aware, no need to call back. Thanks.

## 2021-02-22 NOTE — Telephone Encounter (Signed)
LVM for pt to call back to review advice from [facility] below

## 2021-02-22 NOTE — Telephone Encounter (Signed)
If her back pain is accelerating/worsening to that degree since starting anticoagulation I would encourage her to seek more urgent evaluation (I.e. ED) to ensure there is not an acute component going on in the context of blood thinners. We had talked about seeing her pain clinic in close follow-up but per this note it looks like she is unfortunately still not scheduled until December. Also had not yet had labs to ensure stable. I would recommend to defer changing Pradaxa until her back pain is fully evaluated.

## 2021-02-22 NOTE — Telephone Encounter (Signed)
I spoke with patient. I reviewed the recommendation from [facility]. She states her back is a little better today. Trying to do some stretching and move around more. She states that she is going to keep her apt with pain management on Dec 2 and she sees her PCP on Wed. If pain gets unbearable she will go to ER. Will continue Eliquis. She then proceeded to ask why her losartan was decreased to 50mg . She states her heart did not pound before it was decreased. Could it be from the decreased dose? I advised that her dose was decreased to avoid low BP. She has not been checking her BP. I told her to continue losartan 50mg  and start checking BP.

## 2021-02-28 ENCOUNTER — Telehealth: Payer: Self-pay

## 2021-02-28 ENCOUNTER — Other Ambulatory Visit: Payer: Self-pay

## 2021-02-28 ENCOUNTER — Other Ambulatory Visit: Payer: Self-pay | Admitting: *Deleted

## 2021-02-28 NOTE — Telephone Encounter (Signed)
Pt is having excruciating back pain.. pt would like to move lab work to day of appt next week and do both in one day rather than two separate appts... pt is needing to be able to also make it to her pain management appt as well and doesn't want to put to much strain on herself... please advise

## 2021-02-28 NOTE — Patient Outreach (Signed)
[facility]) Care Management Telephonic RN Care Manager Note   02/28/2021 Name:  [name] MRN:  [id number] DOB:  [birth year]  Summary: Outreach to patient to follow up on previous outreach and messages received. Patient able to speak only today as she reports awaiting calls from medical providers and has some calls to place on her own  Today she was primarily interested in scheduling a future appointment on 03/06/21 at 4 pm to re review previous discussed information (transportation, medicines, pain management)  She does confirm she is to attend her pain management appointment on Friday 03/03/21  She has outreached to the [facility] and spoken with Tish and plan to start attending sessions in the future   Subjective: Jennifer [name] is an 75 y.o. year old female who is a primary patient of Marda Stalker, [place]. The care management team was consulted for assistance with care management and/or care coordination needs.    Telephonic RN Care Manager completed Telephone Visit today.   Objective:  Medications Reviewed Today     Reviewed by Barbaraann Faster, RN (Registered Nurse) on 02/28/21 at [facility] List Status: <None>   Medication Order Taking? Sig Documenting Provider Last Dose Status Informant  acetaminophen (TYLENOL) 500 MG tablet [id number] No Take 500 mg by mouth 2 (two) times daily as needed (for pain). [provider] Taking Active Self  apixaban (ELIQUIS) 5 MG TABS tablet [id number] No Take 1 tablet (5 mg total) by mouth 2 (two) times daily. Charlie Pitter, PA-C Taking Active   aspirin EC 81 MG tablet [id number] No Take 1 tablet (81 mg total) by mouth daily. Swallow whole. Werner Lean, MD Taking Active Self  dextromethorphan-guaiFENesin [facility] DM) 30-600 MG 12hr tablet [id number] No Take 1 tablet by mouth every 12 (twelve) hours as needed for cough. [provider] Taking Active Self  fexofenadine  (ALLEGRA) 60 MG tablet [id number] No Take 60 mg by mouth See admin instructions. Take 60 mg by mouth one to two times a day as needed for allergy symptoms [provider] Taking Active Self           Med Note Maryjo Rochester Sep 15, 2020  4:15 PM)    fluconazole (DIFLUCAN) 100 MG tablet [id number] No TAKE 1 TABLET BY MOUTH EVERY DAY Campbell Riches, MD Taking Active Self  hydrALAZINE (APRESOLINE) 50 MG tablet [id number] No Take 1 tablet (50 mg total) by mouth 3 (three) times daily. Werner Lean, MD Taking Active Self  HYDROcodone-acetaminophen (NORCO) 7.5-325 MG tablet [id number] No Take 1 tablet by mouth every 8 (eight) hours as needed for moderate pain or severe pain. [provider] Taking Active Self  hydrocortisone (ANUSOL-HC) 25 MG suppository [id number] No Place 25 mg rectally 2 (two) times daily as needed for hemorrhoids. [provider] Taking Active Self  isosorbide dinitrate (ISORDIL) 10 MG tablet [id number] No TAKE 1 TABLET BY MOUTH THREE TIMES A DAY Chandrasekhar, Mahesh A, MD Taking Active Self  losartan (COZAAR) 50 MG tablet [id number]  Take 1 tablet (50 mg total) by mouth daily. Charlie Pitter, PA-C  Active   meclizine (ANTIVERT) 25 MG tablet [id number] No Take 25 mg by mouth 3 (three) times daily as needed for dizziness. [provider] Taking Active Self           Med Note Maryjo Rochester Sep 15, 2020  4:15 PM)    Meth-Hyo-M Bl-Benz  Acd-Ph Sal 81.6 MG TABS [id number] No Take 1 tablet (81.6 mg total) by mouth 2 (two) times daily as needed. Campbell Riches, MD Taking Active   metoprolol tartrate (LOPRESSOR) 25 MG tablet [id number] No Take 1 tablet (25 mg total) by mouth 2 (two) times daily. Werner Lean, MD Taking Active   nitroGLYCERIN (NITROSTAT) 0.4 MG SL tablet [id number] No Place 1 tablet (0.4 mg total) under the tongue every 5 (five) minutes as needed for chest pain. Take up to 3 tablets Chandrasekhar, Mahesh A, MD Taking  Active   nystatin cream (MYCOSTATIN) [id number] No Apply topically 2 (two) times daily. Campbell Riches, MD Taking Active Self  omeprazole (PRILOSEC) 20 MG capsule [id number] No Take 20 mg by mouth See admin instructions. Take 20 mg by mouth in the morning one hour before breakfast and an additional 20 mg before evening meal as needed for GERD [provider] Taking Active Self           Med Note Duffy Bruce, Legrand Como   Thu Sep 15, 2020  4:16 PM)    ondansetron (ZOFRAN-ODT) 4 MG disintegrating tablet [id number] No Take 4 mg by mouth every 8 (eight) hours as needed for nausea (dissolve orally). [provider] Taking Active Self  promethazine (PHENERGAN) 25 MG tablet [id number] No Take 1 tablet (25 mg total) by mouth every 6 (six) hours as needed for refractory nausea / vomiting. Molpus, John, MD Taking Active Self  spironolactone (ALDACTONE) 25 MG tablet [id number] No Take 1 tablet (25 mg total) by mouth daily. Werner Lean, MD Taking Active Self  sucralfate (CARAFATE) 1 G tablet [id number] No Take 1 g by mouth See admin instructions. 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 09/15/2020   Chronic low back pain 09/15/2020   Chronic diastolic CHF (congestive heart failure) ([place]) 09/15/2020   Heart murmur 09/08/2020   Allergic rhinitis 08/17/2020   Altered bowel  function 08/17/2020   Anxiety disorder 08/17/2020   Body mass index (BMI) 35.0-35.9, adult 08/17/2020   Chronic fatigue syndrome 08/17/2020   Colon cancer screening 08/17/2020   Functional diarrhea 08/17/2020   Hypothyroidism 08/17/2020   Intestinal gas excretion 08/17/2020   Localized obesity 08/17/2020   Nausea 08/17/2020   Polypharmacy 08/17/2020   Posttraumatic headache 08/17/2020   Posttraumatic vertigo 08/17/2020   Pure hypercholesterolemia 08/17/2020   Lower abdominal pain 08/17/2020   Scoliosis 08/17/2020   Sleep apnea 08/17/2020   Unspecified abnormal finding in specimens from other organs, systems and tissues 08/17/2020   Vitamin D deficiency 08/17/2020   Back pain 04/28/2020   Candidiasis 10/15/2019   Encounter for orthopedic follow-up care 08/12/2018   Vertigo 02/26/2018   Degenerative arthritis of right knee 02/19/2018   Urinary tract infection, site not specified  07/04/2017   Arthralgia of both knees 01/23/2017   Facet arthritis, degenerative, lumbar spine 01/23/2017   Benign paroxysmal positional vertigo 07/03/2016   IBS (irritable bowel syndrome) 04/19/2015   GERD (gastroesophageal reflux disease) 03/21/2015   Fibromyalgia 03/21/2015   Recurrent bacterial cystitis 03/21/2015   HTN (hypertension) 03/21/2015   Fibrositis 03/21/2015   SI (sacroiliac) pain 12/16/2014   DDD (degenerative disc disease), lumbar 05/11/2014   Facet arthropathy 05/11/2014   Spinal stenosis of lumbar region 05/11/2014      SDOH:  (Social Determinants of Health) assessments and interventions performed:  SDOH Interventions    Flowsheet Row Most Recent Value  SDOH Interventions   Social Connections Interventions Intervention Not Indicated       Care Plan  Review of patient past medical history, allergies, medications, health status, including review of consultants reports, laboratory and other test data, was performed as part of comprehensive evaluation for care management  services.   There are no care plans that you recently modified to display for this patient.    Plan: The care management team will reach out to the patient again over the next 7-10 business days.  Safia Panzer L. [name], RN, BSN, CCM [facility] Telephonic Care Management Care Coordinator Office number [phone] Main [facility] number [phone] Fax number [phone]

## 2021-03-01 ENCOUNTER — Telehealth: Payer: Self-pay | Admitting: Internal Medicine

## 2021-03-01 NOTE — Telephone Encounter (Signed)
Patient called again and said that she is having excruciating back pain.. pt would like to move lab work to day of appt next week and do both in one day rather than two separate appts... pt is needing to be able to also make it to her pain management appt as well and doesn't want to put to much strain on herself... please advise

## 2021-03-01 NOTE — Telephone Encounter (Signed)
Spoke with the pt and she will have labs 03/08/21.

## 2021-03-01 NOTE — Telephone Encounter (Signed)
Unable to LM... no answer  Pt is on for labs and appt for 03/08/21.

## 2021-03-01 NOTE — Telephone Encounter (Signed)
Pt is returning call.  

## 2021-03-02 NOTE — Telephone Encounter (Signed)
Left a message for pt to call back

## 2021-03-02 NOTE — Telephone Encounter (Signed)
Pt would like a return call from [facility] before lunch regarding  lab work.

## 2021-03-02 NOTE — Telephone Encounter (Signed)
Pt called back in to discuss labs.  Pt would like to know if it's ok if she comes in early for lab work.  Pt is concerned that lab will close prior to getting labs drawn.  Pt would like MD to see if anymore labs are needed and add to list.  I will route to MD to review.  Pt scheduled for CBC, CK and BMP.

## 2021-03-02 NOTE — Telephone Encounter (Signed)
Pt lab appointment moved to 03/08/21 by another staff member.

## 2021-03-06 ENCOUNTER — Ambulatory Visit: Payer: Medicare Other | Admitting: *Deleted

## 2021-03-08 ENCOUNTER — Ambulatory Visit: Payer: Medicare Other | Admitting: Internal Medicine

## 2021-03-08 ENCOUNTER — Telehealth: Payer: Self-pay | Admitting: Internal Medicine

## 2021-03-08 ENCOUNTER — Other Ambulatory Visit: Payer: Medicare Other

## 2021-03-08 NOTE — Telephone Encounter (Signed)
Called pt in regards to rescheduled OV.  Pt expresses that lumbar stenosis mixed with rain is causing terrible back pain.  Pt wants staff to know that she has a legitimate reason for canceling appointment.  Pt expressed that she is due to see PCP and will have CK, CBC and BMP drawn at that appointment and results faxed to MD.  I advised pt to call back if she is unable to have labs drawn.

## 2021-03-08 NOTE — Telephone Encounter (Signed)
Patient calling to speak with Alena Bills about being worked in this year, because she has to cancel her appointment today. She says she is in pain with her sciatica and also feels nauseas.

## 2021-03-09 ENCOUNTER — Other Ambulatory Visit: Payer: Self-pay | Admitting: *Deleted

## 2021-03-09 NOTE — Patient Outreach (Signed)
[facility]) Care Management  03/09/2021  Jennifer [name] 08/20/1945 [id number]   [facility] Care coordination- unmanageable pain (barrier)  RN CM received a message from patient today prior to her rescheduled [facility] outreach to cancel with a request for a call on 03/13/21 to discuss a possible reschedule [facility] outreach appointment  In the message patient reports continued excruciating back lumbar stenosis pain that has caused her to have to miss a recent cardiology appointment. She has a history of chronic buttock pain, lumbar spinal stenosis with use of Norco 1 tab every 8 hours prn . She outreached per EPIC notes to discuss this with her cardiology staff. 03/08/21 She reports use of heat and rest with decrease  Activities of daily living (ADLs) (hair washing, shower) She report a pain management appointment scheduled for 03/10/21 she anticipates possibly attending.  She reports she has been in "agony for weeks" It is noted in EPIC that patient did make an outreach to pain management provider (Christian Gregor Hams, PA-C office today 03/09/21 to discuss her pain, treatment and upcoming appointments on 03/10/21, 03/17/21.   She reports she has started working with the [facility]   Barriers: Health Behaviors, Transportation,  Dispensing optician, cancelled/rescheduled appointments related to pain  Plan [facility] RN CM will follow up with patient within the next 7-14 business days Forward message pt pcp and specialists  Darci Lykins L. [name], RN, BSN, CCM [facility] Telephonic Care Management Care Coordinator Office number [phone] Mobile number [phone]  Main [facility] number [phone] Fax number [phone]

## 2021-03-13 ENCOUNTER — Other Ambulatory Visit: Payer: Self-pay | Admitting: *Deleted

## 2021-03-13 ENCOUNTER — Other Ambulatory Visit: Payer: Self-pay

## 2021-03-13 NOTE — Patient Outreach (Signed)
[facility]) Care Management  03/13/2021  Jennifer [name] 1946-02-06 [id number]   [facility] Care coordination- reschedule appointment  Outreached to patient to rescheduled outreach appointment   Plan [facility] RN CM will follow up with patient within the next 1-3 business days  Rosary Filosa L. [name], RN, BSN, CCM [facility] Telephonic Care Management Care Coordinator Office number [phone] Mobile number [phone]  Main [facility] number [phone] Fax number [phone]

## 2021-03-15 ENCOUNTER — Other Ambulatory Visit: Payer: Self-pay | Admitting: *Deleted

## 2021-03-15 ENCOUNTER — Other Ambulatory Visit: Payer: Self-pay

## 2021-03-15 NOTE — Patient Outreach (Addendum)
[facility]) Care Management Telephonic RN Care Manager Note   04/11/2021 Name:  Jennifer [name] MRN:  [id number] DOB:  Oct 15, 1945  Summary: Follow up outreach with pt  Loss Her friend Silva Bandy past in the last week but she denies concerns  Transportation Continues to not have a car, renting cars have become expensive Allowed her to ventilate her feelings about a change in cone transportation guidelines related to medicaid patients Now connected with senior resources of [place] but has unreliable drivers as senior of [employer] is based on volunteers [employer] transportation [employer]) on last week Long wait times trying to reach [employer] but has a friend Mackenze who help guide her. RN Cm unable to provide her with answers to questions about cone transportation other than answers she was already provided by [facility] care guide Millie and cone transportation staff Mo.   Still having chronic lower back pain Started natural products with [place] (Dr Tessa Lerner) to assist with pain       Subjective: Jennifer [name] is a 75 y.o. year old female who is a primary patient of Marda Stalker, [place]. The care management team was consulted for assistance with care management and/or care coordination needs.    Telephonic RN Care Manager completed Telephone Visit today.   Objective:  Medications Reviewed Today     Reviewed by Barbaraann Faster, RN (Registered Nurse) on 02/28/21 at [place] List Status: <None>   Medication Order Taking? Sig Documenting Provider Last Dose Status Informant  acetaminophen (TYLENOL) 500 MG tablet [id number] No Take 500 mg by mouth 2 (two) times daily as needed (for pain). [provider] Taking Active Self  apixaban (ELIQUIS) 5 MG TABS tablet [id number] No Take 1 tablet (5 mg total) by mouth 2 (two) times daily. Charlie Pitter, PA-C Taking Active   aspirin EC 81 MG tablet [id number] No Take 1 tablet (81 mg total)  by mouth daily. Swallow whole. Werner Lean, MD Taking Active Self  dextromethorphan-guaiFENesin [facility] DM) 30-600 MG 12hr tablet [id number] No Take 1 tablet by mouth every 12 (twelve) hours as needed for cough. [provider] Taking Active Self  fexofenadine (ALLEGRA) 60 MG tablet [id number] No Take 60 mg by mouth See admin instructions. Take 60 mg by mouth one to two times a day as needed for allergy symptoms [provider] Taking Active Self           Med Note Maryjo Rochester Sep 15, 2020  4:15 PM)    fluconazole (DIFLUCAN) 100 MG tablet [id number] No TAKE 1 TABLET BY MOUTH EVERY DAY Campbell Riches, MD Taking Active Self  hydrALAZINE (APRESOLINE) 50 MG tablet [id number] No Take 1 tablet (50 mg total) by mouth 3 (three) times daily. Werner Lean, MD Taking Active Self  HYDROcodone-acetaminophen (NORCO) 7.5-325 MG tablet [id number] No Take 1 tablet by mouth every 8 (eight) hours as needed for moderate pain or severe pain. [provider] Taking Active Self  hydrocortisone (ANUSOL-HC) 25 MG suppository [id number] No Place 25 mg rectally 2 (two) times daily as needed for hemorrhoids. [provider] Taking Active Self  isosorbide dinitrate (ISORDIL) 10 MG tablet [id number] No TAKE 1 TABLET BY MOUTH THREE TIMES A DAY Chandrasekhar, Mahesh A, MD Taking Active Self  losartan (COZAAR) 50 MG tablet [id number]  Take 1 tablet (50 mg total) by mouth daily. [name], Nedra Hai, PA-C  Active   meclizine (ANTIVERT) 25 MG tablet  [id number] No Take 25 mg by mouth 3 (three) times daily as needed for dizziness. [provider] Taking Active Self           Med Note Duffy Bruce, Para Skeans Sep 15, 2020  4:15 PM)    Meth-Hyo-M Bl-Benz Acd-Ph Sal 81.6 MG TABS [id number] No Take 1 tablet (81.6 mg total) by mouth 2 (two) times daily as needed. Campbell Riches, MD Taking Active   metoprolol tartrate (LOPRESSOR) 25 MG tablet [id number] No Take 1 tablet (25 mg  total) by mouth 2 (two) times daily. Werner Lean, MD Taking Active   nitroGLYCERIN (NITROSTAT) 0.4 MG SL tablet [id number] No Place 1 tablet (0.4 mg total) under the tongue every 5 (five) minutes as needed for chest pain. Take up to 3 tablets Chandrasekhar, Mahesh A, MD Taking Active   nystatin cream (MYCOSTATIN) [id number] No Apply topically 2 (two) times daily. Campbell Riches, MD Taking Active Self  omeprazole (PRILOSEC) 20 MG capsule [id number] No Take 20 mg by mouth See admin instructions. Take 20 mg by mouth in the morning one hour before breakfast and an additional 20 mg before evening meal as needed for GERD [provider] Taking Active Self           Med Note Duffy Bruce, Legrand Como   Thu Sep 15, 2020  4:16 PM)    ondansetron (ZOFRAN-ODT) 4 MG disintegrating tablet [id number] No Take 4 mg by mouth every 8 (eight) hours as needed for nausea (dissolve orally). [provider] Taking Active Self  promethazine (PHENERGAN) 25 MG tablet [id number] No Take 1 tablet (25 mg total) by mouth every 6 (six) hours as needed for refractory nausea / vomiting. Molpus, John, MD Taking Active Self  spironolactone (ALDACTONE) 25 MG tablet [id number] No Take 1 tablet (25 mg total) by mouth daily. Werner Lean, MD Taking Active Self  sucralfate (CARAFATE) 1 G tablet [id number] No Take 1 g by mouth See admin instructions. Take 1 gram by mouth in the morning and an additional 1 gram once a day for flares [provider] Taking Active Self           Med Note Duffy Bruce, Legrand Como   Thu Sep 15, 2020  4:16 PM)               SDOH:  (Social Determinants of Health) assessments and interventions performed:  SDOH Interventions    Flowsheet Row Most Recent Value  SDOH Interventions   Food Insecurity Interventions Intervention Not Indicated  Financial Strain Interventions Intervention Not Indicated  Housing Interventions Intervention Not Indicated  Intimate Partner Violence  Interventions Intervention Not Indicated  Stress Interventions Intervention Not Indicated  Transportation Interventions Other (Comment)  [medicaid transportation, senior services of [place], rental cars, friends]       Care Plan  Review of patient past medical history, allergies, medications, health status, including review of consultants reports, laboratory and other test data, was performed as part of comprehensive evaluation for care management services.   Care Plan : RN Care Manager Plan of Care  Updates made by Barbaraann Faster, RN since 04/11/2021 12:00 AM     Problem: Complex Care Coordination Needs and disease management in patient with Chronic pain   Priority: High     Long-Range Goal: Establish Plan of Care for Management Complex SDOH Barriers, disease management and Care Coordination Needs in patient with Chronic pain   Start Date: 02/13/2021  This Visit's Progress: On track  Recent  Progress: Not on track  Priority: High  Note:   Current Barriers:  Knowledge Deficits related to plan of care for management of chronic pain  Care Coordination needs related to Transportation, Limited education about chronic pain*, and Lacks knowledge of community resource: transportation (non medical, non cone) 02/13/21 Patient reports symptoms of "heart pounding", "so much pain in the last three days in her back and buttocks with extreme" intervals of loose stools and difficulty sitting up She provides RN CM details of her medical history leading to these symptoms. RN CM allowed her to ventilate and was not at times able to interject inquiries. She questions if her pain is related to use of Xarelto and Eliquis side effects.  She reports discussing this with D [name] on 02/01/21 and melissa the [facility] pharmacy staff. She has samples of Eliquis as her xarelto was discontinued. She reports a long history of rare side effects to medications. She denies any continued bleeding side effects, only joint  and muscle pain plus streak of blood when blew nose. She inquires about coverage of complementary services to include acupuncture and chiropractor services. She does not have access to a computer/internet therefore information searched and discussed with her . She is scheduled to follow up with her pcp on 02/15/21 and urology on 02/16/21 but continues to report transportation concerns to non cone providers and to complete errands. She has been using [employer] to rent a car when unable to use [facility] transportation services Denies anxiety is the cause of her pain but reports the pain is causing her anxiety   Barriers: Health Behaviors Transportation Other Pain management, Frequent cancelled/rescheduled medical & [facility] appointments related to changes in her schedule for errands/transportation -(reports unreliable drivers, extended pick/wait times), personal Choices No cell/mobile phone, internet access Loss of friend Silva Bandy on the week of 03/06/21 - (previously assist her with transportation) -Pt denied s/s of depression r/t loss or need for resources    RN CM Clinical Goal(s):  Patient will verbalize understanding of plan for management of chronic pain work with community resource care guide to address needs related to  Transportation non medical, non cone  through collaboration with Consulting civil engineer, provider, and care team.  03/15/21 Goal ongoing for transportation: Pt was outreached by [facility] care Guide, discontinued from cone transportation services and accessing her medicaid benefit transportation ([employer], A[employer] and big [employer]), senior resources of [place] volunteer transportation, Chief of Staff, friends  Interventions: Outreaches to re assess needs  offer suggestions, care coordination prn  Inter-disciplinary care team collaboration (see longitudinal plan of care) Evaluation of current treatment plan related to  self management and patient's adherence to plan as established by  provider Answered questions about 24 hour nurse line, [facility] RN CM services 03/15/21 provided brainstormed various local rent to own companies, [place] billing number, Sent in mail information on CAP/DA after education provided) Encouraged her to outreach to [employer], [employer], [place] way for resources     Health Maintenance/community resources Interventions:  (Status:  New goal.) Short Term Goal Provide brainstormed resources to assist with possible transportation needs 03/15/21 Goal ongoing for transportation: Pt was outreached by [place], discontinued from cone transportation services and accessing her medicaid benefit transportation ([employer], Aware os [employer] and big wheels), senior resources of Therapist, nutritional transportation, Chief of Staff, friends RN CM Outreach to [employer] - confirmed pt does not have an assigned DSS staff for Medicaid of [place] plan and can use either [employer]  [place] or [employer] [phone] for medicaid transportation. Macie Burows confirms extended wait times to schedule transportation at [phone]. Macie Burows confirms patient eligible for transportation services from 9/22 to 10/2021   Recommendations/Changes made from today's visit: Encouraged pt to inquire about the name of her DSS staff, of CAP/DA, speak with an assigned DSS staff about resources for her, if available Encouraged her to follow up with list of rent to own vehicles companies  [employer] car donations suggested but the pt voiced she did not think a vehicle from this resource would be reliable Suggest she work with previous Teacher, early years/pre her parents car was purchased from  Discuss her calling [employer] billing department to get a copy of her last admission bill by calling [phone]  Pain Interventions:Goal .goal being met- continue with pain - 03/15/21 Continues with chronic lower back pain level 8 Has pain management provider, treatment plan and medicines but with various  cancelled appointments related to pain or transportation  Pain assessment performed Medications reviewed Reviewed provider established plan for pain management; Discussed importance of adherence to all scheduled medical appointments; Counseled on the importance of reporting any/all new or changed pain symptoms or management strategies to pain management provider; Discussed use of relaxation techniques and/or diversional activities to assist with pain reduction (distraction, imagery, relaxation, massage, acupressure, TENS, heat, and cold application; Reviewed with patient prescribed pharmacological and nonpharmacological pain relief strategies; Screening for signs and symptoms of depression related to chronic disease state;  Assessed social determinant of health barriers;  03/15/21 She called [place] to confirm her medicare covers chiropractic & medicine services and she will pay only $60 for being a loyal radio listener vs $160   Patient Goals/Self-Care Activities: Patient will self administer medications as prescribed Patient will attend all scheduled provider appointments Patient will call pharmacy for medication refills Patient will continue to perform ADL's independently Patient will continue to perform IADL's independently Patient will call provider office for new concerns or questions Patient will follow her pain management treatment plan (Hydrocodone and nerve blocks when not on antibiotics-next scheduled 04/14/2021)per Dr Posey Pronto (last seen per patient on 11/25/20) and outreach to providers as needed   03/15/21 continue to work with [facility] (Dr Tessa Lerner) alternative/complementary medicine for pain management options 03/15/21 Patient will follow up on brainstormed suggestions for transportation (rent to own car companies, previous Teacher, early years/pre, CAP/DA services, [place] billing)  Follow Up Plan:  The patient has been provided with contact information for the care  management team and has been advised to call with any health related questions or concerns.  The care management team will reach out to the patient again over the next 30 business days.       Plan: The patient has been provided with contact information for the care management team and has been advised to call with any health related questions or concerns.  The care management team will reach out to the patient again over the next 30 business  days.  Anaalicia Reimann L. Lavina Hamman, RN, BSN, [place] Coordinator Office number [phone] Main [facility] number [phone] Fax number [phone]

## 2021-03-17 DIAGNOSIS — M7061 Trochanteric bursitis, right hip: Secondary | ICD-10-CM | POA: Diagnosis not present

## 2021-03-17 DIAGNOSIS — G894 Chronic pain syndrome: Secondary | ICD-10-CM | POA: Diagnosis not present

## 2021-03-17 DIAGNOSIS — M7918 Myalgia, other site: Secondary | ICD-10-CM | POA: Diagnosis not present

## 2021-03-17 DIAGNOSIS — M47816 Spondylosis without myelopathy or radiculopathy, lumbar region: Secondary | ICD-10-CM | POA: Diagnosis not present

## 2021-03-17 DIAGNOSIS — M48062 Spinal stenosis, lumbar region with neurogenic claudication: Secondary | ICD-10-CM | POA: Diagnosis not present

## 2021-03-17 DIAGNOSIS — Z79899 Other long term (current) drug therapy: Secondary | ICD-10-CM | POA: Diagnosis not present

## 2021-03-21 ENCOUNTER — Ambulatory Visit: Payer: Medicare Other | Admitting: Allergy & Immunology

## 2021-04-05 ENCOUNTER — Telehealth: Payer: Self-pay | Admitting: Internal Medicine

## 2021-04-05 NOTE — Telephone Encounter (Signed)
Called pt in regards to eliquis/ nosebleed. Pt reports noticing blood to left nostril after blowing nose.  I informed pt that nosebleeds are common for pt taking blood thinners.  I advised pt to lean head forward and apply pressure to the side that is bleeding for a few minutes. If bleeding continues longer than 10-15 minutes to let the office know.  I also advised pt to use Saline spray to keep nostrils moist.  Pt verbalizes understanding.  No further questions or concerns.

## 2021-04-05 NOTE — Telephone Encounter (Signed)
Pt c/o medication issue:  1. Name of Medication: apixaban (ELIQUIS) 5 MG TABS tablet  2. How are you currently taking this medication (dosage and times per day)? Take 1 tablet (5 mg total) by mouth 2 (two) times daily.  3. Are you having a reaction (difficulty breathing--STAT)? no  4. What is your medication issue? Patient calling in to say that she has been having a nose bleed. Patient would like for the nurse to give her a call back to see if its because of the eliquis. This afternoon was the most blood she has had. Please advise

## 2021-04-05 NOTE — Telephone Encounter (Signed)
Left a message to call back.

## 2021-04-05 NOTE — Telephone Encounter (Signed)
Duplicate encounter please disregard. 

## 2021-04-05 NOTE — Telephone Encounter (Signed)
Pt is returning call to triage °

## 2021-04-11 ENCOUNTER — Other Ambulatory Visit: Payer: Self-pay | Admitting: *Deleted

## 2021-04-11 NOTE — Patient Outreach (Signed)
[facility]) Care Management  04/11/2021  Jennifer [name] Aug 07, 1945 [id number]  [facility] care coordination/rescheduled appointment RN CM received a message left by pt requesting to reschedule her scheduled [facility] appointment to 04/26/21  RN CM returned a call to patient to reschedule her for 04/26/21   The care management team will reach out to the patient again over the next 30 business  days.   Jennifer Cantrelle L. [name], RN, BSN, CCM [facility] Telephonic Care Management Care Coordinator Office number [phone] Main [facility] number [phone] Fax number [phone]

## 2021-04-12 ENCOUNTER — Ambulatory Visit: Payer: Self-pay | Admitting: *Deleted

## 2021-04-12 DIAGNOSIS — N390 Urinary tract infection, site not specified: Secondary | ICD-10-CM | POA: Diagnosis not present

## 2021-04-18 NOTE — Progress Notes (Deleted)
Electrophysiology Office Note:    Date:  04/18/2021   ID:  Jennifer [name], DOB [birth year], MRN [id number]  PCP:  Jarrett Soho, PA-C  [facility] Cardiologist:  Christell Constant, MD  [facility] Electrophysiologist:  None   Referring MD: Corrin Parker, PA-C   Chief Complaint: Atrial flutter  History of Present Illness:    Jennifer [name] is a 76 y.o. female who presents for an evaluation of atrial flutter at the request of Marjie Skiff, PA-C. Their medical history includes atrial flutter, hypertension, obstructive sleep apnea.  The patient was last seen by Lucile Crater, PA-C on February 01, 2021.  She has a history of coronary artery disease including a mid to distal LAD stenosis that is being medically managed.  She was hospitalized in October 2022 with chest pain and palpitations.  On telemetry she was noted to have paroxysms of atrial flutter with rapid ventricular rates.  She takes Eliquis for stroke prophylaxis.     Past Medical History:  Diagnosis Date   Acute meniscal tear of left knee    FOLLOWED BY DR GIAFFREY   Atrial flutter (HCC)    AV block, Mobitz 1    BPPV (benign paroxysmal positional vertigo)    Chronic bladder pain    Chronic fatigue syndrome    Chronic low back pain    W/ RIGHT LEG PAIN AND NUMBNESS   Cystitis, chronic    Fibromyalgia    GERD (gastroesophageal reflux disease)    Hiatal hernia    Hypertension    IC (interstitial cystitis)    OSA (obstructive sleep apnea)    per pt study yrs ago-- moderate osa ,  intolerant cpap   Pinched vertebral nerve    bilateral L2 -- L3 and L3 -- L4-/  epidural injection's treatment , PT and pain clince   PONV (postoperative nausea and vomiting)    severe   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    Self-catheterizes urinary bladder    Spinal stenosis, lumbar region with neurogenic claudication    Wears glasses    Wears partial dentures    upper and lower     Past Surgical History:  Procedure Laterality Date   ABDOMINAL AORTOGRAM N/A 09/16/2020   Procedure: ABDOMINAL AORTOGRAM;  Surgeon: Iran Ouch, MD;  [facility];  Service: Cardiovascular;  Laterality: N/A;   ABDOMINAL HYSTERECTOMY  1980   w/ Left Salpingoophorectomy   CARDIAC CATHETERIZATION  08-21-2001  dr Verdis Prime   normal coronary arteries and LVF   CARPAL TUNNEL RELEASE Bilateral 1986   CECALCYSTOPLASTY/  APPENDECTOMY  1984   at [facility]   CHOLECYSTECTOMY  1992   CYSTO WITH HYDRODISTENSION N/A 01/12/2016   Procedure: CYSTOSCOPY/HYDRODISTENSION;  Surgeon: Jethro Bolus, MD;  Location: [facility];  Service: Urology;  Laterality: N/A;   CYSTO WITH HYDRODISTENSION N/A 08/30/2016   Procedure: CYSTOSCOPY/HYDRODISTENSION OF BLADDER INJECTION OF MARCAINE/PYRIDIUM;  Surgeon: Jethro Bolus, MD;  Location: [facility];  Service: Urology;  Laterality: N/A;   LEFT HEART CATH AND CORONARY ANGIOGRAPHY N/A 09/16/2020   Procedure: LEFT HEART CATH AND CORONARY ANGIOGRAPHY;  Surgeon: Iran Ouch, MD;  Location: [facility];  Service: Cardiovascular;  Laterality: N/A;   MULTIPLE CYSTO/  HYDRODISTENTION/  INSTILLATION THERAPY  last one 08-19-2009   NASAL SINUS SURGERY  1987   TONSILLECTOMY  as child   VAGINAL GROWTH REMOVED  as teen  Current Medications: No outpatient medications have been marked as taking for the 04/19/21 encounter (Appointment) with Lanier Prude, MD.     Allergies:   Moxifloxacin, Quinolones, Sulfa antibiotics, Furosemide, Aspirin, Doxazosin, Duloxetine hcl, Hydrochlorothiazide, Ibuprofen, Other, Ranexa [ranolazine], Robaxin [methocarbamol], Topiramate, Xarelto [rivaroxaban], Zofran [ondansetron hcl], Codeine, Dimethyl sulfoxide, Macrodantin [nitrofurantoin macrocrystal], Nitrofurantoin, Tape, and Yellow dye   Social History   Socioeconomic History   Marital status: Single    Spouse  name: none   Number of children: 0   Years of education: College   Highest education level: Not on file  Occupational History   Occupation: n/a  Tobacco Use   Smoking status: Never   Smokeless tobacco: Never  Vaping Use   Vaping Use: Never used  Substance and Sexual Activity   Alcohol use: No    Alcohol/week: 0.0 standard drinks   Drug use: No   Sexual activity: Not on file  Other Topics Concern   Not on file  Social History Narrative   Lives alone   Sandrea Matte, RN    Caffeine use: 2 glasses tea/day   Brother   Mother passed in ?67 at 29, father passed in 51's in 2006   She was a Runner, broadcasting/film/video      Social Determinants of Corporate investment banker Strain: Low Risk    Difficulty of Paying Living Expenses: Not hard at all  Food Insecurity: No Food Insecurity   Worried About Programme researcher, broadcasting/film/video in the Last Year: Never true   Barista in the Last Year: Never true  Transportation Needs: Unmet Transportation Needs   Lack of Transportation (Medical): Yes   Lack of Transportation (Non-Medical): Yes  Physical Activity: Not on file  Stress: No Stress Concern Present   Feeling of Stress : Only a little  Social Connections: Moderately Integrated   Frequency of Communication with Friends and Family: Twice a week   Frequency of Social Gatherings with Friends and Family: Twice a week   Attends Religious Services: 1 to 4 times per year   Active Member of [employer] or Organizations: Yes   Attends Banker Meetings: 1 to 4 times per year   Marital Status: Never married     Family History: The patient's family history includes Alzheimer's disease in her father; Asthma in her father; CAD in her father; COPD in her father; Cancer in her brother; Colonic polyp in her mother; Emphysema in her father; Heart failure in her mother; Hypertension in her father and mother; Peripheral vascular disease in her mother; Stroke in her mother.  ROS:   Please see the history of  present illness.    All other systems reviewed and are negative.  EKGs/Labs/Other Studies Reviewed:    The following studies were reviewed today:  January 10, 2021 echo Left ventricular function normal, 60% Right ventricular function normal  01/11/2021 ECG  Heart rate 137 bpm.  Concerning for atrial flutter versus less likely atrial tachycardia.  EKG:  The ekg ordered today demonstrates ***   Recent Labs: 09/25/2020: B Natriuretic Peptide 106.8 01/09/2021: ALT 19; BUN 12; Creatinine, Ser 0.88; Hemoglobin 11.8; Platelets 311; Potassium 4.0; Sodium 139 01/10/2021: Magnesium 2.1; TSH 1.973  Recent Lipid Panel    Component Value Date/Time   CHOL 132 01/11/2021 0223   TRIG 136 01/11/2021 0223   HDL 41 01/11/2021 0223   CHOLHDL 3.2 01/11/2021 0223   VLDL 27 01/11/2021 0223   LDLCALC 64 01/11/2021 0223    Physical  Exam:    VS:  There were no vitals taken for this visit.    Wt Readings from Last 3 Encounters:  02/01/21 191 lb 3.2 oz (86.7 kg)  01/09/21 209 lb 7 oz (95 kg)  11/03/20 191 lb (86.6 kg)     GEN: *** Well nourished, well developed in no acute distress HEENT: Normal NECK: No JVD; No carotid bruits LYMPHATICS: No lymphadenopathy CARDIAC: ***RRR, no murmurs, rubs, gallops RESPIRATORY:  Clear to auscultation without rales, wheezing or rhonchi  ABDOMEN: Soft, non-tender, non-distended MUSCULOSKELETAL:  No edema; No deformity  SKIN: Warm and dry NEUROLOGIC:  Alert and oriented x 3 PSYCHIATRIC:  Normal affect       ASSESSMENT:    No diagnosis found. PLAN:    In order of problems listed above:    Continue beta-blocker 2-week ZIO monitor to assess burden     Total time spent with patient today *** minutes. This includes reviewing records, evaluating the patient and coordinating care.  Medication Adjustments/Labs and Tests Ordered: Current medicines are reviewed at length with the patient today.  Concerns regarding medicines are outlined above.  No  orders of the defined types were placed in this encounter.  No orders of the defined types were placed in this encounter.    Signed, Rossie Muskratameron T. Lalla BrothersLambert, MD, [facility], [facility]RS 04/18/2021 9:10 PM    Electrophysiology  [facility]

## 2021-04-19 ENCOUNTER — Institutional Professional Consult (permissible substitution): Payer: Medicare Other | Admitting: Cardiology

## 2021-04-19 DIAGNOSIS — I2510 Atherosclerotic heart disease of native coronary artery without angina pectoris: Secondary | ICD-10-CM

## 2021-04-19 DIAGNOSIS — I10 Essential (primary) hypertension: Secondary | ICD-10-CM

## 2021-04-19 DIAGNOSIS — I471 Supraventricular tachycardia: Secondary | ICD-10-CM

## 2021-04-26 ENCOUNTER — Other Ambulatory Visit: Payer: Self-pay | Admitting: *Deleted

## 2021-04-26 NOTE — Patient Outreach (Signed)
[facility]) Care Management  04/26/2021  Jennifer [name] [birth year] [id number]   [facility] Care coordination-reschedule outreach  Voice message left by patient today requesting to reschedule today's outreach to 05/04/21 RN CM rescheduled pt to 05/04/21 and returned a call as requested in her voice message No answer. [facility] RN CM left HIPAA [facility] Portability and Accountability Act) compliant voicemail message along with CM's contact info at the preferred number indicated in EPIC    Plan [name] RN CM will follow up with patient within the next 30+ business days    [name] L. [name], RN, BSN, CCM [name] Telephonic Care Management Care Coordinator Office number [phone] Mobile number [phone]  Main [facility] number [phone] Fax number [phone]

## 2021-04-27 DIAGNOSIS — I10 Essential (primary) hypertension: Secondary | ICD-10-CM | POA: Diagnosis not present

## 2021-04-27 DIAGNOSIS — E559 Vitamin D deficiency, unspecified: Secondary | ICD-10-CM | POA: Diagnosis not present

## 2021-04-27 DIAGNOSIS — E039 Hypothyroidism, unspecified: Secondary | ICD-10-CM | POA: Diagnosis not present

## 2021-04-27 DIAGNOSIS — E7800 Pure hypercholesterolemia, unspecified: Secondary | ICD-10-CM | POA: Diagnosis not present

## 2021-04-27 DIAGNOSIS — I700 Atherosclerosis of aorta: Secondary | ICD-10-CM | POA: Diagnosis not present

## 2021-04-27 DIAGNOSIS — Z23 Encounter for immunization: Secondary | ICD-10-CM | POA: Diagnosis not present

## 2021-04-27 DIAGNOSIS — M549 Dorsalgia, unspecified: Secondary | ICD-10-CM | POA: Diagnosis not present

## 2021-05-01 DIAGNOSIS — Z20822 Contact with and (suspected) exposure to covid-19: Secondary | ICD-10-CM | POA: Diagnosis not present

## 2021-05-01 DIAGNOSIS — Z1152 Encounter for screening for COVID-19: Secondary | ICD-10-CM | POA: Diagnosis not present

## 2021-05-04 ENCOUNTER — Ambulatory Visit: Payer: Medicare Other | Admitting: Allergy & Immunology

## 2021-05-04 ENCOUNTER — Other Ambulatory Visit: Payer: Self-pay | Admitting: *Deleted

## 2021-05-04 NOTE — Patient Outreach (Signed)
[facility]) Care Management Telephonic RN Care Manager Note   05/04/2021 Name:  SELDA JALBERT MRN:  [id number] DOB:  [birth year]  Summary: [facility] Unsuccessful outreach\- voice mail box full unable to leave a message at [phone]  Review of Epic notes since last patient encounter indicates pt with outreaches to pain management provider and pcp to discuss adjustment of pain management medications   TRINITA DEVLIN is an 76 y.o. year old female who is a primary patient of Jarrett Soho, [place]. The care management team was consulted for assistance with care management and/or care coordination needs.    Telephonic RN Care Manager completed Telephone Visit today.   Objective:  Medications Reviewed Today     Reviewed by Clinton Gallant, RN (Registered Nurse) on 02/28/21 at 1404  Med List Status: <None>   Medication Order Taking? Sig Documenting Provider Last Dose Status Informant  acetaminophen (TYLENOL) 500 MG tablet [id number] No Take 500 mg by mouth 2 (two) times daily as needed (for pain). [provider] Taking Active Self  apixaban (ELIQUIS) 5 MG TABS tablet [id number] No Take 1 tablet (5 mg total) by mouth 2 (two) times daily. Laurann Montana, PA-C Taking Active   aspirin EC 81 MG tablet [id number] No Take 1 tablet (81 mg total) by mouth daily. Swallow whole. Christell Constant, MD Taking Active Self  dextromethorphan-guaiFENesin [facility] DM) 30-600 MG 12hr tablet [id number] No Take 1 tablet by mouth every 12 (twelve) hours as needed for cough. [provider] Taking Active Self  fexofenadine (ALLEGRA) 60 MG tablet [id number] No Take 60 mg by mouth See admin instructions. Take 60 mg by mouth one to two times a day as needed for allergy symptoms [provider] Taking Active Self           Med Note Ventura Bruns Sep 15, 2020  4:15 PM)    fluconazole (DIFLUCAN) 100 MG tablet [id number] No TAKE 1 TABLET BY MOUTH EVERY DAY Ginnie Smart, MD Taking Active Self  hydrALAZINE (APRESOLINE) 50 MG tablet [id number] No Take 1 tablet (50 mg total) by mouth 3 (three) times daily. Christell Constant, MD Taking Active Self  HYDROcodone-acetaminophen (NORCO) 7.5-325 MG tablet [id number] No Take 1 tablet by mouth every 8 (eight) hours as needed for moderate pain or severe pain. [provider] Taking Active Self  hydrocortisone (ANUSOL-HC) 25 MG suppository [id number] No Place 25 mg rectally 2 (two) times daily as needed for hemorrhoids. [provider] Taking Active Self  isosorbide dinitrate (ISORDIL) 10 MG tablet [id number] No TAKE 1 TABLET BY MOUTH THREE TIMES A DAY Chandrasekhar, Mahesh A, MD Taking Active Self  losartan (COZAAR) 50 MG tablet [id number]  Take 1 tablet (50 mg total) by mouth daily. Laurann Montana, PA-C  Active   meclizine (ANTIVERT) 25 MG tablet [id number] No Take 25 mg by mouth 3 (three) times daily as needed for dizziness. [provider] Taking Active Self           Med Note Antony Madura, Harlin Rain Sep 15, 2020  4:15 PM)    Meth-Hyo-M Bl-Benz Acd-Ph Sal 81.6 MG TABS [id number] No Take 1 tablet (81.6 mg total) by mouth 2 (two) times daily as needed. Ginnie Smart, MD Taking Active   metoprolol tartrate (LOPRESSOR) 25 MG tablet [id number] No Take 1 tablet (25 mg total) by mouth 2 (two) times daily. Christell Constant, MD Taking Active   nitroGLYCERIN (  NITROSTAT) 0.4 MG SL tablet [id number] No Place 1 tablet (0.4 mg total) under the tongue every 5 (five) minutes as needed for chest pain. Take up to 3 tablets Chandrasekhar, Mahesh A, MD Taking Active   nystatin cream (MYCOSTATIN) [id number] No Apply topically 2 (two) times daily. Ginnie Smart, MD Taking Active Self  omeprazole (PRILOSEC) 20 MG capsule [id number] No Take 20 mg by mouth See admin instructions. Take 20 mg by mouth in the morning one hour before breakfast and an additional 20 mg before evening meal as needed for GERD [provider] Taking Active Self           Med Note Antony Madura, Arn Medal   Thu Sep 15, 2020  4:16 PM)    ondansetron (ZOFRAN-ODT) 4 MG disintegrating tablet [id number] No Take 4 mg by mouth every 8 (eight) hours as needed for nausea (dissolve orally). [provider] Taking Active Self  promethazine (PHENERGAN) 25 MG tablet [id number] No Take 1 tablet (25 mg total) by mouth every 6 (six) hours as needed for refractory nausea / vomiting. Molpus, John, MD Taking Active Self  spironolactone (ALDACTONE) 25 MG tablet [id number] No Take 1 tablet (25 mg total) by mouth daily. Christell Constant, MD Taking Active Self  sucralfate (CARAFATE) 1 G tablet [id number] No Take 1 g by mouth See admin instructions. Take 1 gram by mouth in the morning and an additional 1 gram once a day for flares [provider] Taking Active Self           Med Note Antony Madura, Arn Medal   Thu Sep 15, 2020  4:16 PM)               SDOH:  (Social Determinants of Health) assessments and interventions performed:    Care Plan  Review of patient past medical history, allergies, medications, health status, including review of consultants reports, laboratory and other test data, was performed as part of comprehensive evaluation for care management services.   There are no care plans that you recently modified to display for this patient.    Plan: [facility] RN CM scheduled this patient for another call attempt within 30+ business days Unsuccessful outreach on 05/04/21   Cala Bradford L. [name], RN, BSN, CCM [facility] Telephonic Care Management Care Coordinator Office number [phone] Mobile number [phone]  Main [facility] number [phone] Fax number [phone]

## 2021-05-04 NOTE — Patient Outreach (Signed)
[facility]) Care Management  05/04/2021  Jennifer [name] [birth year] [id number]   [facility] Care coordination- rescheduled appointment  Received a return call from patient to updated RN CM she missed RN CM outreach today as she was on the phone interacting with [facility]  Requested a change in appointment day and time  Plan [facility] RN CM will follow up with patient within the next 30+ business days    [name] L. [name], RN, BSN, CCM [facility] Telephonic Care Management Care Coordinator Office number [phone] Mobile number [phone]  Main [facility] number [phone] Fax number [phone]

## 2021-05-05 DIAGNOSIS — E7800 Pure hypercholesterolemia, unspecified: Secondary | ICD-10-CM | POA: Diagnosis not present

## 2021-05-05 DIAGNOSIS — I10 Essential (primary) hypertension: Secondary | ICD-10-CM | POA: Diagnosis not present

## 2021-05-05 DIAGNOSIS — I700 Atherosclerosis of aorta: Secondary | ICD-10-CM | POA: Diagnosis not present

## 2021-05-05 DIAGNOSIS — E559 Vitamin D deficiency, unspecified: Secondary | ICD-10-CM | POA: Diagnosis not present

## 2021-05-10 NOTE — Progress Notes (Signed)
Cardiology Office Note:    Date:  05/11/2021   ID:  PUNEET MEGEE, DOB 10-04-1945, MRN [id number]  PCP:  Marda Stalker, PA-C   [facility] Providers Cardiologist:  Werner Lean, MD Electrophysiologist:  Vickie Epley, MD     Referring MD: Jamey Ripa Physicians An*   CC:  Follow up  History of Present Illness:    Jennifer [name] is a 76 y.o. female with a hx of hypertension, PVC, HLD, chronic pain syndrome, who presents as an urgent visit for HTN emergency (found in echo lab).  In interim of this visit, patient sent to ED, had CCTA, found to have obstructive disease with LAD disease un-amenable to PCI (concerned for dissection; reviewed by Drs. Theron Arista).  Discharged on ranolazine, isordil, losartan, atorvastatin, aldactone, and clopidogrel. Nebivolol DC due to sinus pause.  Had planned allergy referral.  Seen 09/25/20.  Since last visit has had positive CCTA.  Then had LHC.  Discharged off ASA because of intolerance. Had hospitalization 12/2020. For AFL.  She was planned for EP outpatient f/u for ablation, this is pending 05/17/21.  Saw Allergist in December.  Had atorvastatin myalgias  Patient notes that she is doing better.   Her back pain has improved on her Gabapentin.  It is still present. Has been off the statin medication since 2022.  CPK may be 5.6 (ULN).  Notes that she has had issues with statin.  There are no interval hospital/ED visit.   This is the best her blood pressure has been. Has had no palpitations recently, nothing like she felt in 10/22. Hgb has dropped from 12-> 11.  Getting B12 and iron.    Rare Chest pain, but has needed no nitroglycerin.  No SOB/DOE and no PND/Orthopnea.  No weight gain or leg swelling.     Past Medical History:  Diagnosis Date   Acute meniscal tear of left knee    FOLLOWED BY DR GIAFFREY   Atrial flutter (HCC)    AV block, Mobitz 1    BPPV (benign paroxysmal positional vertigo)    Chronic bladder pain     Chronic fatigue syndrome    Chronic low back pain    W/ RIGHT LEG PAIN AND NUMBNESS   Cystitis, chronic    Fibromyalgia    GERD (gastroesophageal reflux disease)    Hiatal hernia    Hypertension    IC (interstitial cystitis)    OSA (obstructive sleep apnea)    per pt study yrs ago-- moderate osa ,  intolerant cpap   Pinched vertebral nerve    bilateral L2 -- L3 and L3 -- L4-/  epidural injection's treatment , PT and pain clince   PONV (postoperative nausea and vomiting)    severe   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    Self-catheterizes urinary bladder    Spinal stenosis, lumbar region with neurogenic claudication    Wears glasses    Wears partial dentures    upper and lower    Past Surgical History:  Procedure Laterality Date   ABDOMINAL AORTOGRAM N/A 09/16/2020   Procedure: ABDOMINAL AORTOGRAM;  Surgeon: Wellington Hampshire, MD;  Location: [place] [facility];  Service: Cardiovascular;  Laterality: N/A;   ABDOMINAL HYSTERECTOMY  1980   w/ Left Salpingoophorectomy   CARDIAC CATHETERIZATION  08-21-2001  dr Daneen Schick   normal coronary arteries and LVF   CARPAL TUNNEL RELEASE Bilateral 1986   CECALCYSTOPLASTY/  APPENDECTOMY  1984   at [facility] N/A 01/12/2016   Procedure: CYSTOSCOPY/HYDRODISTENSION;  Surgeon: Carolan Clines, MD;  Location: [facility];  Service: Urology;  Laterality: N/A;   CYSTO WITH HYDRODISTENSION N/A 08/30/2016   Procedure: CYSTOSCOPY/HYDRODISTENSION OF BLADDER INJECTION OF MARCAINE/PYRIDIUM;  Surgeon: Carolan Clines, MD;  Location: [facility];  Service: Urology;  Laterality: N/A;   LEFT HEART CATH AND CORONARY ANGIOGRAPHY N/A 09/16/2020   Procedure: LEFT HEART CATH AND CORONARY ANGIOGRAPHY;  Surgeon: Wellington Hampshire, MD;  Location: [place] [facility];  Service: Cardiovascular;  Laterality: N/A;   MULTIPLE CYSTO/   HYDRODISTENTION/  INSTILLATION THERAPY  last one 08-19-2009   NASAL SINUS SURGERY  1987   TONSILLECTOMY  as child   VAGINAL GROWTH REMOVED  as teen    Current Medications: Current Meds  Medication Sig   acetaminophen (TYLENOL) 500 MG tablet Take 500 mg by mouth 2 (two) times daily as needed (for pain).   apixaban (ELIQUIS) 5 MG TABS tablet Take 1 tablet (5 mg total) by mouth 2 (two) times daily.   dextromethorphan-guaiFENesin (MUCINEX DM) 30-600 MG 12hr tablet Take 1 tablet by mouth every 12 (twelve) hours as needed for cough.   ezetimibe (ZETIA) 10 MG tablet Take 1 tablet (10 mg total) by mouth daily.   fexofenadine (ALLEGRA) 60 MG tablet Take 60 mg by mouth See admin instructions. Take 60 mg by mouth one to two times a day as needed for allergy symptoms   fluconazole (DIFLUCAN) 100 MG tablet TAKE 1 TABLET BY MOUTH EVERY DAY   gabapentin (NEURONTIN) 100 MG capsule daily at 6 (six) AM.   hydrALAZINE (APRESOLINE) 50 MG tablet Take 1 tablet (50 mg total) by mouth 3 (three) times daily.   HYDROcodone-acetaminophen (NORCO) 7.5-325 MG tablet Take 1 tablet by mouth every 8 (eight) hours as needed for moderate pain or severe pain.   hydrocortisone (ANUSOL-HC) 25 MG suppository Place 25 mg rectally 2 (two) times daily as needed for hemorrhoids.   isosorbide dinitrate (ISORDIL) 10 MG tablet TAKE 1 TABLET BY MOUTH THREE TIMES A DAY   meclizine (ANTIVERT) 25 MG tablet Take 25 mg by mouth 3 (three) times daily as needed for dizziness.   Meth-Hyo-M Bl-Benz Acd-Ph Sal 81.6 MG TABS Take 1 tablet (81.6 mg total) by mouth 2 (two) times daily as needed.   metoprolol tartrate (LOPRESSOR) 25 MG tablet Take 1 tablet (25 mg total) by mouth 2 (two) times daily.   nitroGLYCERIN (NITROSTAT) 0.4 MG SL tablet Place 1 tablet (0.4 mg total) under the tongue every 5 (five) minutes as needed for chest pain. Take up to 3 tablets   nystatin cream (MYCOSTATIN) Apply topically 2 (two) times daily.   omeprazole (PRILOSEC) 20  MG capsule Take 20 mg by mouth See admin instructions. Take 20 mg by mouth in the morning one hour before breakfast and an additional 20 mg before evening meal as needed for GERD   ondansetron (ZOFRAN-ODT) 4 MG disintegrating tablet Take 4 mg by mouth every 8 (eight) hours as needed for nausea (dissolve orally).   polyethylene glycol (MIRALAX / GLYCOLAX) 17 g packet Take 17 g by mouth as needed.   senna (SENOKOT) 8.6 MG tablet Take 1 tablet by mouth daily.   spironolactone (ALDACTONE) 25 MG tablet Take 1 tablet (25 mg total) by mouth daily.   sucralfate (CARAFATE) 1 G tablet Take 1 g by mouth See admin instructions. Take 1 gram  by mouth in the morning and an additional 1 gram once a day for flares   triamcinolone cream (KENALOG) 0.1 % Apply topically 2 (two) times daily as needed.   [DISCONTINUED] aspirin EC 81 MG tablet Take 1 tablet (81 mg total) by mouth daily. Swallow whole.   [DISCONTINUED] promethazine (PHENERGAN) 25 MG tablet Take 1 tablet (25 mg total) by mouth every 6 (six) hours as needed for refractory nausea / vomiting.     Allergies:   Moxifloxacin, Quinolones, Sulfa antibiotics, Furosemide, Aspirin, Doxazosin, Duloxetine hcl, Hydrochlorothiazide, Ibuprofen, Nebivolol hcl, Other, Ranexa [ranolazine], Robaxin [methocarbamol], Topiramate, Xarelto [rivaroxaban], Zofran [ondansetron hcl], Codeine, Dimethyl sulfoxide, Macrodantin [nitrofurantoin macrocrystal], Nitrofurantoin, Tape, and Yellow dye   Social History   Socioeconomic History   Marital status: Single    Spouse name: none   Number of children: 0   Years of education: College   Highest education level: Not on file  Occupational History   Occupation: n/a  Tobacco Use   Smoking status: Never   Smokeless tobacco: Never  Vaping Use   Vaping Use: Never used  Substance and Sexual Activity   Alcohol use: No    Alcohol/week: 0.0 standard drinks   Drug use: No   Sexual activity: Not on file  Other Topics Concern   Not on  file  Social History Narrative   Lives alone   Sandrea Matte, RN    Caffeine use: 2 glasses tea/day   Brother   Mother passed in ?29 at 46, father passed in 37's in 2006   She was a Runner, broadcasting/film/video      Social Determinants of Corporate investment banker Strain: Low Risk    Difficulty of Paying Living Expenses: Not hard at all  Food Insecurity: No Food Insecurity   Worried About Programme researcher, broadcasting/film/video in the Last Year: Never true   Barista in the Last Year: Never true  Transportation Needs: Unmet Transportation Needs   Lack of Transportation (Medical): Yes   Lack of Transportation (Non-Medical): Yes  Physical Activity: Not on file  Stress: No Stress Concern Present   Feeling of Stress : Only a little  Social Connections: Moderately Integrated   Frequency of Communication with Friends and Family: Twice a week   Frequency of Social Gatherings with Friends and Family: Twice a week   Attends Religious Services: 1 to 4 times per year   Active Member of [employer] or Organizations: Yes   Attends Engineer, structural: 1 to 4 times per year   Marital Status: Never married    Social: Father and mother were sick so she took care of her  Family History: The patient's family history includes Alzheimer's disease in her father; Asthma in her father; CAD in her father; COPD in her father; Cancer in her brother; Colonic polyp in her mother; Emphysema in her father; Heart failure in her mother; Hypertension in her father and mother; Peripheral vascular disease in her mother; Stroke in her mother.  ROS:   Please see the history of present illness.     All other systems reviewed and are negative.  EKGs/Labs/Other Studies Reviewed:    The following studies were reviewed today:  EKG:   05/11/21: SR rate 65 Qtc 420 11/24/20:  Sinus rhythm 88 with occasional PACs Qtc 426  Transthoracic Echocardiogram: Date: 09/15/20 Results:  1. Left ventricular ejection fraction, by estimation, is 60  to 65%. The  left ventricle has normal function. The left ventricle has no  regional  wall motion abnormalities. There is moderate left ventricular hypertrophy.  Left ventricular diastolic  parameters are consistent with Grade I diastolic dysfunction (impaired  relaxation). Elevated left atrial pressure. The average left ventricular  global longitudinal strain is -20.4 %. The global longitudinal strain is  normal.   2. Right ventricular systolic function is normal. The right ventricular  size is normal.   3. The mitral valve is normal in structure. Trivial mitral valve  regurgitation. No evidence of mitral stenosis.   4. The aortic valve is tricuspid. Aortic valve regurgitation is not  visualized. Mild aortic valve stenosis.   5. Aortic dilatation noted. There is mild dilatation of the ascending  aorta, measuring 43 mm.   6. The inferior vena cava is normal in size with greater than 50%  respiratory variability, suggesting right atrial pressure of 3 mmHg.   Comparison(s): 07/11/17 EF 60-65%. PA pressure 32mmHg.   Cardiac CT: Date: Results:  IMPRESSION: 1. Coronary calcium score of 157. This was 74th percentile for age, sex, and race matched control.   2. Normal coronary origin with right dominance.   3. CAD-RADS 3. Moderate stenosis. Consider symptom-guided anti-ischemic pharmacotherapy as well as risk factor modification per guideline directed care. Additional analysis with CT FFR will be submitted.   4. Ascending aorta is mildly dilated for age and body surface area: 40 mm. Aortic atherosclerosis noted.   5. Mild dilation of the pulmonary artery.   6. Cannot exclude small patent foramen ovale.   7. Small hiatal hernia; hepatic steatosis   Left/Right Heart Catheterizations: Date: 09/16/20 Results: Mid LAD lesion is 50% stenosed. 3rd Diag lesion is 60% stenosed. Dist LAD-1 lesion is 90% stenosed. Dist LAD-2 lesion is 40% stenosed.   1.  Significant one-vessel  coronary artery disease involving the mid to distal LAD at the bifurcation of the third diagonal which has also 60% ostial stenosis.  The coronary arteries are very tortuous suggestive of hypertensive heart disease. 2.  Left ventricular angiography was not performed.  EF was normal by echo. 3.  Severely elevated blood pressure with mildly elevated left ventricular end-diastolic pressure. 4.  Transient left bundle branch block after engaging the left ventricle.  This resolved by the end of the case. 5.  Abdominal aortogram was performed and showed no evidence of renal artery stenosis.   Recommendations: Unfortunately, the LAD stenosis is in a very tortuous segment and at the origin of the third diagonal branch which also has ostial disease.  PCI in this area will be high risk for dissection as well as sidebranch closure especially in the setting of uncontrolled hypertension.  In addition, the distal LAD after the stenosis is diffusely diseased. I recommend maximizing medical therapy and controlling blood pressure before considering PCI.  I added amlodipine to her antihypertensive medications. Other complicating issue is that the patient cannot tolerate aspirin long-term.  If we decide to proceed with PCI at some point, we will have to make sure she tolerates ticagrelor which can likely be used as monotherapy.   Recent Labs: 09/25/2020: B Natriuretic Peptide 106.8 01/09/2021: ALT 19; BUN 12; Creatinine, Ser 0.88; Hemoglobin 11.8; Platelets 311; Potassium 4.0; Sodium 139 01/10/2021: Magnesium 2.1; TSH 1.973  Recent Lipid Panel    Component Value Date/Time   CHOL 132 01/11/2021 0223   TRIG 136 01/11/2021 0223   HDL 41 01/11/2021 0223   CHOLHDL 3.2 01/11/2021 0223   VLDL 27 01/11/2021 0223   LDLCALC 64 01/11/2021 0223    Physical Exam:  Today's Vitals   05/11/21 1626  BP: 138/62  Pulse: 65  SpO2: 97%  Weight: 86.6 kg  Height: 5\' 4"  (1.626 m)   Body mass index is 32.79 kg/m.  Wt  Readings from Last 3 Encounters:  05/11/21 86.6 kg  02/01/21 86.7 kg  01/09/21 95 kg     GEN:  Well nourished, well developed in no acute distress HEENT: Bilateral Frank's Sign NECK: No JVD LYMPHATICS: No lymphadenopathy CARDIAC: Regular rhythm with irregular beats with rate PACs II/VI Systolic Crescendo RESPIRATORY:  Clear to auscultation without rales, wheezing or rhonchi  ABDOMEN: Soft, non-tender, non-distended MUSCULOSKELETAL:  No edema; No deformity  SKIN: Warm and dry NEUROLOGIC:  Alert and oriented x 3 PSYCHIATRIC:  Normal affect   ASSESSMENT:    1. Aortic valve disorder   2. Atherosclerosis of native coronary artery of native heart without angina pectoris   3. Typical atrial flutter (HCC)     PLAN:    Coronary Artery Disease AFL CHADSVASC  - Sinus pause on nebivlol with Mobitz Type 1; stopped in the past, has tolerated metoprolol) Symptomatic PAC Aortic Atherosclerosis Aorta is at the ULN of norma (last seen Fall of 2022) HLD HTN Chronic Pain Syndome Multiple Drug Allergies Mild AS - will continue eliquis 5 mg PO BID - given new anemia and 6 months of either DAPT or ASA and AC on 09/16/20 - LDL 143, will trial Zetia 10 mg PO - hydralazine 50 mg PO TID - continue isordil 10 mg TID, if worsening CP, will increase the dose - losartan 50 mg PO Daily - continue spirolactone 25 mg PO Daily - Has yet to see the allergist in regard to her multiple medications and intolerances - off ranolazine because of heart pounding  - She will not be able to tolerate a strategy that is primarily based on medical managent of CAD or AFL in the setting of her multiple medication intolerances (21 presently) - present  Need to get labs from [facility]  Will see Dayna in 3 months Me in 6 months Echo before 6 month f/u  Time Spent Directly with Patient:   I have spent a total of 60 minutes with the patient reviewing notes, imaging, EKGs, labs and examining the patient as well as  establishing an assessment and plan that was discussed personally with the patient.  > 50% of time was spent in direct patient care .  Medication Adjustments/Labs and Tests Ordered: Current medicines are reviewed at length with the patient today.  Concerns regarding medicines are outlined above.  Orders Placed This Encounter  Procedures   EKG 12-Lead   ECHOCARDIOGRAM COMPLETE    Meds ordered this encounter  Medications   ezetimibe (ZETIA) 10 MG tablet    Sig: Take 1 tablet (10 mg total) by mouth daily.    Dispense:  90 tablet    Refill:  3     Patient Instructions  Medication Instructions:  Your physician has recommended you make the following change in your medication:  STOP: aspirin  START: ezetimibe (Zetia) 10 mg by mouth once daily  *If you need a refill on your cardiac medications before your next appointment, please call your pharmacy*   Lab Work: NONE If you have labs (blood work) drawn today and your tests are completely normal, you will receive your results only by: [place] (if you have MyChart) OR A paper copy in the mail If you have any lab test that is abnormal or we need to change your  treatment, we will call you to review the results.   Testing/Procedures: Your physician has requested that you have an echocardiogram in 5-5.5 months. Echocardiography is a painless test that uses sound waves to create images of your heart. It provides your doctor with information about the size and shape of your heart and how well your hearts chambers and valves are working. This procedure takes approximately one hour. There are no restrictions for this procedure.    Follow-Up: At [facility], you and your health needs are our priority.  As part of our continuing mission to provide you with exceptional heart care, we have created designated Provider Care Teams.  These Care Teams include your primary Cardiologist (physician) and Advanced Practice Providers (APPs -   Physician Assistants and Nurse Practitioners) who all work together to provide you with the care you need, when you need it.  We recommend signing up for the patient portal called "MyChart".  Sign up information is provided on this After Visit Summary.  MyChart is used to connect with patients for Virtual Visits (Telemedicine).  Patients are able to view lab/test results, encounter notes, upcoming appointments, etc.  Non-urgent messages can be sent to your provider as well.   To learn more about what you can do with MyChart, go to [email or web address].    Your next appointment:   3 month(s) with Melina Copa  The format for your next appointment:   In Person  Provider:   Werner Lean, MD -6 months       Signed, Werner Lean, MD  05/11/2021 5:57 PM    [place]

## 2021-05-11 ENCOUNTER — Ambulatory Visit (INDEPENDENT_AMBULATORY_CARE_PROVIDER_SITE_OTHER): Payer: Medicare Other | Admitting: Internal Medicine

## 2021-05-11 ENCOUNTER — Encounter: Payer: Self-pay | Admitting: Internal Medicine

## 2021-05-11 ENCOUNTER — Other Ambulatory Visit: Payer: Self-pay

## 2021-05-11 VITALS — BP 138/62 | HR 65 | Ht 64.0 in | Wt 191.0 lb

## 2021-05-11 DIAGNOSIS — I359 Nonrheumatic aortic valve disorder, unspecified: Secondary | ICD-10-CM | POA: Diagnosis not present

## 2021-05-11 DIAGNOSIS — I483 Typical atrial flutter: Secondary | ICD-10-CM

## 2021-05-11 DIAGNOSIS — I2510 Atherosclerotic heart disease of native coronary artery without angina pectoris: Secondary | ICD-10-CM | POA: Diagnosis not present

## 2021-05-11 DIAGNOSIS — I4892 Unspecified atrial flutter: Secondary | ICD-10-CM | POA: Insufficient documentation

## 2021-05-11 MED ORDER — EZETIMIBE 10 MG PO TABS: 10.0000 mg | ORAL_TABLET | Freq: Every day | ORAL | 3 refills | Status: DC

## 2021-05-11 NOTE — Patient Instructions (Signed)
Medication Instructions:  Your physician has recommended you make the following change in your medication:  STOP: aspirin  START: ezetimibe (Zetia) 10 mg by mouth once daily  *If you need a refill on your cardiac medications before your next appointment, please call your pharmacy*   Lab Work: NONE If you have labs (blood work) drawn today and your tests are completely normal, you will receive your results only by: MyChart Message (if you have MyChart) OR A paper copy in the mail If you have any lab test that is abnormal or we need to change your treatment, we will call you to review the results.   Testing/Procedures: Your physician has requested that you have an echocardiogram in 5-5.5 months. Echocardiography is a painless test that uses sound waves to create images of your heart. It provides your doctor with information about the size and shape of your heart and how well your hearts chambers and valves are working. This procedure takes approximately one hour. There are no restrictions for this procedure.    Follow-Up: At [facility], you and your health needs are our priority.  As part of our continuing mission to provide you with exceptional heart care, we have created designated Provider Care Teams.  These Care Teams include your primary Cardiologist (physician) and Advanced Practice Providers (APPs -  Physician Assistants and Nurse Practitioners) who all work together to provide you with the care you need, when you need it.  We recommend signing up for the patient portal called "MyChart".  Sign up information is provided on this After Visit Summary.  MyChart is used to connect with patients for Virtual Visits (Telemedicine).  Patients are able to view lab/test results, encounter notes, upcoming appointments, etc.  Non-urgent messages can be sent to your provider as well.   To learn more about what you can do with MyChart, go to [email or web address].    Your next appointment:    3 month(s) with Ronie Spies  The format for your next appointment:   In Person  Provider:   Christell Constant, MD -6 months

## 2021-05-12 ENCOUNTER — Telehealth: Payer: Self-pay | Admitting: Internal Medicine

## 2021-05-12 NOTE — Telephone Encounter (Signed)
Called pt in regards to follow up questions.  I reviewed appointments set up at yesterday's OV.  Pt had no further questions.  Pt wants to verify that labs from [place] were received. RN checked MD box and no results noted.  Pt also asked which medication had diuretic effects.  Informed pt spironolactone has diuretic effect.  Pt would like a call back on Monday in regards to labs.  Will attempt to call on Monday 05/15/21 if time permits.

## 2021-05-12 NOTE — Telephone Encounter (Signed)
Patient had some follow up questions from her appointment yesterday with Dr. [name]. She would like Shamea to give her a call sometime before the end of the day

## 2021-05-15 ENCOUNTER — Other Ambulatory Visit: Payer: Self-pay | Admitting: *Deleted

## 2021-05-15 DIAGNOSIS — R509 Fever, unspecified: Secondary | ICD-10-CM | POA: Diagnosis not present

## 2021-05-15 NOTE — Patient Outreach (Signed)
[facility]) Care Management Telephonic RN Care Manager Note   05/15/2021 Name:  Jennifer [name] MRN:  [id number] DOB:  1945/04/15  Summary: Follow up outreach after pt left a message earlier in the day confirming she would be available for an outreach today Jennifer [name] informs RN CM she is fair when inquiry made She reports continued concerns with chronic pain, transportation, atrial flutter, fall, possible covid or side effect symptoms from UTI medicine  Pain management Gabapentin has given "a great amount of relief" after a telehealth visit with Jennifer [name] was completed She voice interest in getting a MRI to check for any changes  Atrial flutter  She has some atrial flutter but has been seen by cardiology (used senior wheels) recently  She is scheduled to be seen again in April 2023  Fall and other s/s tripped up curb last week resulting in a cut to the side of her left index finger, bilateral knee bruises  She reports hurting on Friday 05/12/21 but then developing a low grade fever (99.8-99.99) on 05/13/21 Saturday with coughing at night (mucinex dm resolved coughing) She reports she felt better 05/14/21 but developed a temperature of 100.1 today 05/15/22 She took tylenol and placed calls to her pcp office   urinary tract infection (UTI) She is being treated for an UTI (dx by Jennifer [name]) and has a hx of medicine side effects She had some poor appetite and diarrhea, but no N/V  Transportation  Has to rent a car to go 05/16/21 to go get tested for covid She has free home covid tests as recommended by pcp but informs RN CM she "don't understand how to use them" Had UTI Jennifer [name] amoxicillin Still rents cars and reports this is expensive and a barrier financially   ?community resource via pcp Jennifer [name] recalls a call from "Jennifer [name]" " [phone]" recently who pt states called on behalf of pcp to offer possible resource to assist her. Nurse to come in twice a week in mornings  per Jennifer Pt reports she informed this person she would call her back but has not. She does not feel like she needs visiting [name] or nurses [phone] is a number for "physician's chronic care management team"   Received a call during outreach, concluded to answer Left a message later request a return call to her while RN CM was speaking with staff at her pcp office   Recommendations/Changes made from today's visit: Follow up and assess of worsening s/s Offered possible resources for transportation options  Inquired if she is now open to medicaid or public transport but she voices she is not comfortable related to having to provide 3 day in advance appointments, not knowing bus schedule or feel safe with accessing bus Encouraged use of medicaid & confirmed [place] transportation is not an option as she has medicaid benefit Discussed home visiting [name] and/or nurse services - She informs RN CM she does not feel she needs them RN CM discussed this may decrease the time and money used to rent vehicles, allow her to get labs completed at home, tests completed at home RN CM unable to find a Jennifer [name] documented note in EPIC. Pt informed. Encouraged pt f/u with [facility] Reviewed with patient that several of her of her s/s maybe related to her UTI and side effects of the tx    Outreach to pcp office [phone] to attempt to find [place] . Spoke with [place] who  checked and was not able to find an outreach to patient for possible services, nor an Animal nutritionist or upstream staff member name, Jennifer [name].  She also left a note for Jennifer Dolores RN to return a call to RN CM   Return a call to patient as requested in a voice message left. Updated her on the outreach to pcp office.   Reviewed with her that [facility] does not have home health services but RN CM would collaborate with pcp office staff prn She voiced understanding   Subjective: Jennifer [name] is an 76 y.o. year old female who is a primary patient of  Jennifer [name]. The care management team was consulted for assistance with care management and/or care coordination needs.    Telephonic RN Care Manager completed Telephone Visit today.   Objective:  Medications Reviewed Today     Reviewed by Jennifer [name], CMA (Certified Medical Assistant) on 05/11/21 at [place] List Status: <None>   Medication Order Taking? Sig Documenting Provider Last Dose Status Informant  acetaminophen (TYLENOL) 500 MG tablet [id number] Yes Take 500 mg by mouth 2 (two) times daily as needed (for pain). Provider, Historical, [name] Taking Active Self  apixaban (ELIQUIS) 5 MG TABS tablet [id number] Yes Take 1 tablet (5 mg total) by mouth 2 (two) times daily. Jennifer [name] Taking Active   aspirin EC 81 MG tablet [id number] Yes Take 1 tablet (81 mg total) by mouth daily. Swallow whole. Jennifer [name] Taking Active Self  dextromethorphan-guaiFENesin [facility] DM) 30-600 MG 12hr tablet [id number] Yes Take 1 tablet by mouth every 12 (twelve) hours as needed for cough. Provider, Historical, [name] Taking Active Self  fexofenadine (ALLEGRA) 60 MG tablet [id number] Yes Take 60 mg by mouth See admin instructions. Take 60 mg by mouth one to two times a day as needed for allergy symptoms Provider, Historical, [name] Taking Active Self           Med Note Jennifer [name] Sep 15, 2020  4:15 PM)    fluconazole (DIFLUCAN) 100 MG tablet [id number] Yes TAKE 1 TABLET BY MOUTH EVERY DAY Jennifer [name] Taking Active Self  gabapentin (NEURONTIN) 100 MG capsule [id number] Yes daily at 6 (six) AM. Provider, Historical, [name] Taking Active   hydrALAZINE (APRESOLINE) 50 MG tablet [id number] Yes Take 1 tablet (50 mg total) by mouth 3 (three) times daily. Jennifer [name] Taking Active Self  HYDROcodone-acetaminophen (NORCO) 7.5-325 MG tablet [id number] Yes Take 1 tablet by mouth every 8 (eight) hours as needed for moderate pain or severe pain. Provider, Historical,  [name] Taking Active Self  hydrocortisone (ANUSOL-HC) 25 MG suppository [id number] Yes Place 25 mg rectally 2 (two) times daily as needed for hemorrhoids. Provider, Historical, [name] Taking Active Self  isosorbide dinitrate (ISORDIL) 10 MG tablet [id number] Yes TAKE 1 TABLET BY MOUTH THREE TIMES A DAY Chandrasekhar, Mahesh A, [name] Taking Active Self  losartan (COZAAR) 50 MG tablet [id number]  Take 1 tablet (50 mg total) by mouth daily. Jennifer [name]  Expired 05/02/21 2359   meclizine (ANTIVERT) 25 MG tablet [id number] Yes Take 25 mg by mouth 3 (three) times daily as needed for dizziness. Provider, Historical, [name] Taking Active Self           Med Note Jennifer [name] Sep 15, 2020  4:15 PM)    Meth-Hyo-M Bl-Benz Acd-Ph Sal 81.6 MG TABS [id number] Yes Take 1 tablet (81.6 mg total) by mouth  2 (two) times daily as needed. Jennifer [name] Taking Active   metoprolol tartrate (LOPRESSOR) 25 MG tablet [id number] Yes Take 1 tablet (25 mg total) by mouth 2 (two) times daily. Jennifer [name] Taking Active   nitroGLYCERIN (NITROSTAT) 0.4 MG SL tablet [id number] Yes Place 1 tablet (0.4 mg total) under the tongue every 5 (five) minutes as needed for chest pain. Take up to 3 tablets Chandrasekhar, Mahesh A, [name] Taking Active   nystatin cream (MYCOSTATIN) [id number] Yes Apply topically 2 (two) times daily. Jennifer [name] Taking Active Self  omeprazole (PRILOSEC) 20 MG capsule [id number] Yes Take 20 mg by mouth See admin instructions. Take 20 mg by mouth in the morning one hour before breakfast and an additional 20 mg before evening meal as needed for GERD Provider, Historical, [name] Taking Active Self           Med Note Duffy Bruce, Legrand Como   Thu Sep 15, 2020  4:16 PM)    ondansetron (ZOFRAN-ODT) 4 MG disintegrating tablet [id number] Yes Take 4 mg by mouth every 8 (eight) hours as needed for nausea (dissolve orally). Provider, Historical, [name] Taking Active Self  spironolactone (ALDACTONE) 25 MG tablet [id number]  Yes Take 1 tablet (25 mg total) by mouth daily. Jennifer [name] Taking Active Self  sucralfate (CARAFATE) 1 G tablet [id number] Yes Take 1 g by mouth See admin instructions. Take 1 gram by mouth in the morning and an additional 1 gram once a day for flares Provider, Historical, [name] Taking Active Self           Med Note Duffy Bruce, Para Skeans Sep 15, 2020  4:16 PM)    triamcinolone cream (KENALOG) 0.1 % [id number] Yes Apply topically 2 (two) times daily as needed. Provider, Historical, [name] Taking Active              SDOH:  (Social Determinants of Health) assessments and interventions performed:  SDOH Interventions    Flowsheet Row Most Recent Value  SDOH Interventions   Intimate Partner Violence Interventions Intervention Not Indicated  Social Connections Interventions Intervention Not Indicated  Transportation Interventions [id number] Referral, Other (Comment)  [pt with medicaid not able to use cone transport, has rented cars, used senior of [place], does not want to use medicaid or public transportation]       Care Plan  Review of patient past medical history, allergies, medications, health status, including review of consultants reports, laboratory and other test data, was performed as part of comprehensive evaluation for care management services.   Care Plan : RN Care Manager Plan of Care  Updates made by Barbaraann Faster, RN since 05/15/2021 12:00 AM     Problem: Complex Care Coordination Needs and disease management in patient with Chronic pain   Priority: High     Long-Range Goal: Establish Plan of Care for Management Complex SDOH Barriers, disease management and Care Coordination Needs in patient with Chronic pain   Start Date: 02/13/2021  Recent Progress: On track  Priority: High  Note:   Current Barriers:  Knowledge Deficits related to plan of care for management of chronic pain  Care Coordination needs related to Transportation, Limited education about  chronic pain*, and Lacks knowledge of community resource: transportation (non medical, non cone) 02/13/21 Patient reports symptoms of "heart pounding", "so much pain in the last three days in her back and buttocks with extreme" intervals of loose stools and difficulty sitting up She provides RN CM details  of her medical history leading to these symptoms. RN CM allowed her to ventilate and was not at times able to interject inquiries. She questions if her pain is related to use of Xarelto and Eliquis side effects.  She reports discussing this with D [name] on 02/01/21 and melissa the [facility] care pharmacy staff. She has samples of Eliquis as her xarelto was discontinued. She reports a long history of rare side effects to medications. She denies any continued bleeding side effects, only joint and muscle pain plus streak of blood when blew nose. She inquires about coverage of complementary services to include acupuncture and chiropractor services. She does not have access to a computer/internet therefore information searched and discussed with her . She is scheduled to follow up with her pcp on 02/15/21 and urology on 02/16/21 but continues to report transportation concerns to non cone providers and to complete errands. She has been using [employer] to rent a car when unable to use [facility] transportation services Denies anxiety is the cause of her pain but reports the pain is causing her anxiety   Barriers: Health Behaviors Transportation Other Pain management, Frequent cancelled/rescheduled medical & [facility] appointments related to changes in her schedule for errands/transportation -(reports unreliable drivers, extended pick/wait times), personal Choices No cell/mobile phone, internet access no caller ID Loss of friend Silva Bandy on the week of 03/06/21 - (previously assist her with transportation, now friend's family does not respond to her) -Pt denied s/s of depression r/t loss or need for resources    RN CM Clinical  Goal(s):  Patient will verbalize understanding of plan for management of chronic pain work with community resource care guide to address needs related to  Transportation non medical, non cone  through collaboration with Consulting civil engineer, provider, and care team.  03/15/21 Goal ongoing for transportation: Pt was outreached by [facility] care Guide, discontinued from cone transportation services and accessing her medicaid benefit transportation ([employer] [place], [employer] and big wheels), senior resources of [place] volunteer transportation, Chief of Staff, friends recommended  Interventions: Outreaches to re assess needs  offer suggestions, care coordination prn  Inter-disciplinary care team collaboration (see longitudinal plan of care) Evaluation of current treatment plan related to  self management and patient's adherence to plan as established by provider Answered questions about 24 hour nurse line, [facility] RN CM services 03/15/21 provided brainstormed various local rent to own companies, [employer] billing number, Sent in mail information on CAP/DA after education provided) Encouraged her to outreach to [employer], [employer], [place] way for resources 04/26/21 rescheduled outreach per pt voice message to 05/04/21- reported back pain conflicts [id number] unsuccessful outreach unable to leave a message as voice mailbox is full 05/15/21 calls to pcp to f/u on staff r/ community staff LVM for RN     Health Maintenance/community resources Interventions:  (Status:  Goal on track:  NO.) Short Term Goal Provide brainstormed resources to assist with possible transportation needs 03/15/21 Goal ongoing for transportation: Pt was outreached by [facility] care Guide, discontinued from cone transportation services and accessing her medicaid benefit transportation ([employer] [place], [employer] and big wheels), senior resources of [place] volunteer transportation, Chief of Staff, friends RN CM Outreach to [employer] - confirmed pt does not  have an assigned DSS staff for Medicaid of [place] plan and can use either [employer] [phone] or [employer] [phone] for medicaid transportation. Macie Burows confirms extended wait times to schedule transportation at [phone]. Macie Burows confirms patient eligible for  transportation services from 9/22 to 10/2021 Encouraged pt to inquire about the name of her DSS staff, of CAP/DA, speak with an assigned DSS staff about resources for her, if available Encouraged her to follow up with list of rent to own vehicles companies  [employer] car donations suggested but the pt voiced she did not think a vehicle from this resource would be reliable Suggest she work with previous Teacher, early years/pre her parents car was purchased from  Discuss her calling [place] accounting billing department to get a copy of her last admission bill by calling [phone] 05/15/21 Encouraged use of medicaid & confirmed [place] transportation is not an option as she has medicaid benefit Discussed home visiting [name] and/or nurse services - She informs RN CM she does not feel she needs them RN CM discussed this may decrease the time and money used to rent vehicles, allow her to get labs completed at home, tests completed at home  Pain Interventions: Goal on track Long term goal - 05/15/21 Continues with chronic lower back pain level 5 Has pain management provider, treatment plan and medicines but with various cancelled appointments related to pain or transportation  Pain assessment performed Medications reviewed Reviewed provider established plan for pain management; Discussed importance of adherence to all scheduled medical appointments; Counseled on the importance of reporting any/all new or changed pain symptoms or management strategies to pain management provider; Discussed use of relaxation techniques and/or diversional activities to assist with pain reduction (distraction, imagery, relaxation, massage, acupressure, TENS, heat, and cold  application; Reviewed with patient prescribed pharmacological and nonpharmacological pain relief strategies; Screening for signs and symptoms of depression related to chronic disease state;  Assessed social determinant of health barriers;  03/15/21 She called [place] to confirm her medicare covers chiropractic & medicine services and she will pay only $60 for being a loyal radio listener vs $160   Patient Goals/Self-Care Activities: Patient will self administer medications as prescribed Patient will attend all scheduled provider appointments Patient will call pharmacy for medication refills Patient will continue to perform ADL's independently Patient will continue to perform IADL's independently Patient will call provider office for new concerns or questions Patient will follow her pain management treatment plan (Hydrocodone and nerve blocks when not on antibiotics-next scheduled 04/14/2021)per Jennifer Posey [place] (last seen per patient on 11/25/20) and outreach to providers as needed  05/15/21 had gabapentin added by her pcp -reports helping 03/15/21 continue to work with [facility] (Jennifer Tessa Lerner) alternative/complementary medicine for pain management options 03/15/21 Patient will follow up on brainstormed suggestions for transportation (rent to own car companies, previous car dealer, CAP/DA services,  billing)-05/15/21 pt only followed up on transport companies  Follow Up Plan:  The patient has been provided with contact information for the care management team and has been advised to call with any health related questions or concerns.  The care management team will reach out to the patient again over the next 30+ business days.       Plan: The patient has been provided with contact information for the care management team and has been advised to call with any health related questions or concerns.  The care management team will reach out to the patient again over the next  30+ business days.  Verl Kitson L. Lavina Hamman, RN, BSN, [place] Coordinator Office number [phone] [facility] number [phone] Fax number [phone]

## 2021-05-16 ENCOUNTER — Other Ambulatory Visit: Payer: Self-pay | Admitting: *Deleted

## 2021-05-16 DIAGNOSIS — Z03818 Encounter for observation for suspected exposure to other biological agents ruled out: Secondary | ICD-10-CM | POA: Diagnosis not present

## 2021-05-16 DIAGNOSIS — R509 Fever, unspecified: Secondary | ICD-10-CM | POA: Diagnosis not present

## 2021-05-16 DIAGNOSIS — R059 Cough, unspecified: Secondary | ICD-10-CM | POA: Diagnosis not present

## 2021-05-16 NOTE — Patient Outreach (Signed)
[facility]) Care Management  05/16/2021  TRANISHA TISSUE Aug 20, 1945 [id number]   [facility] Care coordination- pcp office  Received a voice message from pcp RN, [name] Returned a call No answer Left a voice message to include RN CM outreach numbers  Plan [facility] RN CM will follow up with patient within the next 30+ business days Continue to collaborate with pcp office if needed   [place] L. [name], RN, BSN, CCM [facility] Telephonic Care Management Care Coordinator Office number [phone]

## 2021-05-16 NOTE — Patient Outreach (Signed)
[facility] Customer service manager 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 [name], RN, BSN, CCM [facility] Telephonic Care Management Care Coordinator Office number [phone]

## 2021-05-17 ENCOUNTER — Institutional Professional Consult (permissible substitution): Payer: Medicare Other | Admitting: Cardiology

## 2021-05-17 NOTE — Telephone Encounter (Signed)
Called [facility] request to have labs from last OV faxed to our office.

## 2021-05-18 ENCOUNTER — Other Ambulatory Visit: Payer: Self-pay | Admitting: *Deleted

## 2021-05-18 ENCOUNTER — Encounter: Payer: Self-pay | Admitting: *Deleted

## 2021-05-18 NOTE — Patient Outreach (Signed)
[facility]) Care Management Telephonic RN Care Manager Note   05/18/2021 Name:  TOMIKO SCHOON MRN:  [id number] DOB:  Aug 25, 1945  Summary: Follow up outreach with case closure- no further identified medical needs [place] confirms she is doing well  She reports renting a car on 05/17/21 to complete labs and covid testing Covid test negative  Today she reports some pain from "over doing it yesterday" completing errands She voices understanding that [facility] nor [facility] staff are aware of who "Charese" is  She voices appreciation for care coordination, interventions and resources offered to her plus availability to outreach prn   Recommendations/Changes made from today's visit: Assessed for coivd update Updated her on care coordination on 05/16/21 with pcp office related to "Charese"  Discussed external care management programs and possible availability in pcp offices Answered questions about [facility] complex care/disease management/health coach services and her progression Discussed case closure as she reports she was "okay" Discussed availability to outreach to RN CM prn     Subjective: SCHELLY CHUBA is an 76 y.o. year old female who is a primary patient of Marda Stalker, [place]. The care management team was consulted for assistance with care management and/or care coordination needs.    Telephonic RN Care Manager completed Telephone Visit today.   Objective:  Medications Reviewed Today     Reviewed by Jeremy Johann, CMA (Certified Medical Assistant) on 05/11/21 at [place] Status: <None>   Medication Order Taking? Sig Documenting Provider Last Dose Status Informant  acetaminophen (TYLENOL) 500 MG tablet [id number] Yes Take 500 mg by mouth 2 (two) times daily as needed (for pain). [provider] Taking Active Self  apixaban (ELIQUIS) 5 MG TABS tablet [id number] Yes Take 1 tablet (5 mg total) by mouth 2 (two) times daily. Charlie Pitter, PA-C Taking  Active   aspirin EC 81 MG tablet [id number] Yes Take 1 tablet (81 mg total) by mouth daily. Swallow whole. Werner Lean, MD Taking Active Self  dextromethorphan-guaiFENesin [facility] DM) 30-600 MG 12hr tablet [id number] Yes Take 1 tablet by mouth every 12 (twelve) hours as needed for cough. [provider] Taking Active Self  fexofenadine (ALLEGRA) 60 MG tablet [id number] Yes Take 60 mg by mouth See admin instructions. Take 60 mg by mouth one to two times a day as needed for allergy symptoms [provider] Taking Active Self           Med Note Maryjo Rochester Sep 15, 2020  4:15 PM)    fluconazole (DIFLUCAN) 100 MG tablet [id number] Yes TAKE 1 TABLET BY MOUTH EVERY DAY Campbell Riches, MD Taking Active Self  gabapentin (NEURONTIN) 100 MG capsule [id number] Yes daily at 6 (six) AM. [provider] Taking Active   hydrALAZINE (APRESOLINE) 50 MG tablet [id number] Yes Take 1 tablet (50 mg total) by mouth 3 (three) times daily. Werner Lean, MD Taking Active Self  HYDROcodone-acetaminophen (NORCO) 7.5-325 MG tablet [id number] Yes Take 1 tablet by mouth every 8 (eight) hours as needed for moderate pain or severe pain. [provider] Taking Active Self  hydrocortisone (ANUSOL-HC) 25 MG suppository [id number] Yes Place 25 mg rectally 2 (two) times daily as needed for hemorrhoids. [provider] Taking Active Self  isosorbide dinitrate (ISORDIL) 10 MG tablet [id number] Yes TAKE 1 TABLET BY MOUTH THREE TIMES A DAY Chandrasekhar, Mahesh A, MD Taking Active Self  losartan (COZAAR) 50 MG tablet [id number]  Take 1 tablet (50  mg total) by mouth daily. Charlie Pitter, PA-C  Expired 05/02/21 2359   meclizine (ANTIVERT) 25 MG tablet [id number] Yes Take 25 mg by mouth 3 (three) times daily as needed for dizziness. [provider] Taking Active Self           Med Note Duffy Bruce, Para Ske[place] Sep 15, 2020  4:15 PM)    Meth-Hyo-M Bl-Benz Acd-Ph Sal 81.6  MG TABS [id number] Yes Take 1 tablet (81.6 mg total) by mouth 2 (two) times daily as needed. Campbell Riches, MD Taking Active   metoprolol tartrate (LOPRESSOR) 25 MG tablet [id number] Yes Take 1 tablet (25 mg total) by mouth 2 (two) times daily. Werner Lean, MD Taking Active   nitroGLYCERIN (NITROSTAT) 0.4 MG SL tablet [id number] Yes Place 1 tablet (0.4 mg total) under the tongue every 5 (five) minutes as needed for chest pain. Take up to 3 tablets Chandrasekhar, Mahesh A, MD Taking Active   nystatin cream (MYCOSTATIN) [id number] Yes Apply topically 2 (two) times daily. Campbell Riches, MD Taking Active Self  omeprazole (PRILOSEC) 20 MG capsule [id number] Yes Take 20 mg by mouth See admin instructions. Take 20 mg by mouth in the morning one hour before breakfast and an additional 20 mg before evening meal as needed for GERD [provider] Taking Active Self           Med Note Duffy Bruce, Legrand Como   Thu Sep 15, 2020  4:16 PM)    ondansetron (ZOFRAN-ODT) 4 MG disintegrating tablet [id number] Yes Take 4 mg by mouth every 8 (eight) hours as needed for nausea (dissolve orally). [provider] Taking Active Self  spironolactone (ALDACTONE) 25 MG tablet [id number] Yes Take 1 tablet (25 mg total) by mouth daily. Werner Lean, MD Taking Active Self  sucralfate (CARAFATE) 1 G tablet [id number] Yes Take 1 g by mouth See admin instructions. Take 1 gram by mouth in the morning and an additional 1 gram once a day for flares [provider] Taking Active Self           Med Note Duffy Bruce, Para Skeans Sep 15, 2020  4:16 PM)    triamcinolone cream (KENALOG) 0.1 % [id number] Yes Apply topically 2 (two) times daily as needed. [provider] Taking Active              SDOH:  (Social Determinants of Health) assessments and interventions performed:  SDOH Interventions    Flowsheet Row Most Recent Value  SDOH Interventions   Food Insecurity Interventions  Intervention Not Indicated  Stress Interventions Intervention Not Indicated       Care Plan  Review of patient past medical history, allergies, medications, health status, including review of consultants reports, laboratory and other test data, was performed as part of comprehensive evaluation for care management services.   Care Plan : RN Care Manager Plan of Care  Updates made by Barbaraann Faster, RN since 05/18/2021 12:00 AM  Completed 05/18/2021   Problem: Complex Care Coordination Needs and disease management in patient with Chronic pain Resolved 05/18/2021  Priority: High     Long-Range Goal: Establish Plan of Care for Management Complex SDOH Barriers, disease management and Care Coordination Needs in patient with Chronic pain Completed 05/18/2021  Start Date: 02/13/2021  This Visit's Progress: On track  Recent Progress: On track  Priority: High  Note:   Current Barriers:  Knowledge Deficits related to plan of care for management of chronic  pain  Care Coordination needs related to Transportation, Limited education about chronic pain*, and Lacks knowledge of community resource: transportation (non medical, non cone) 02/13/21 Patient reports symptoms of "heart pounding", "so much pain in the last three days in her back and buttocks with extreme" intervals of loose stools and difficulty sitting up She provides RN CM details of her medical history leading to these symptoms. RN CM allowed her to ventilate and was not at times able to interject inquiries. She questions if her pain is related to use of Xarelto and Eliquis side effects.  She reports discussing this with D [name] on 02/01/21 and melissa the [facility] pharmacy staff. She has samples of Eliquis as her xarelto was discontinued. She reports a long history of rare side effects to medications. She denies any continued bleeding side effects, only joint and muscle pain plus streak of blood when blew nose. She inquires about coverage of  complementary services to include acupuncture and chiropractor services. She does not have access to a computer/internet therefore information searched and discussed with her . She is scheduled to follow up with her pcp on 02/15/21 and urology on 02/16/21 but continues to report transportation concerns to non cone providers and to complete errands. She has been using [employer] to rent a car when unable to use [facility] transportation services Denies anxiety is the cause of her pain but reports the pain is causing her anxiety   Barriers: Health Behaviors Transportation Other Pain management, Frequent cancelled/rescheduled medical & [facility] appointments related to changes in her schedule for errands/transportation -(reports unreliable drivers, extended pick/wait times), personal Choices No cell/mobile phone, internet access no caller ID Loss of friend Silva Bandy on the week of 03/06/21 - (previously assist her with transportation, now friend's family does not respond to her) -Pt denied s/s of depression r/t loss or need for resources    RN CM Clinical Goal(s):  Patient will verbalize understanding of plan for management of chronic pain work with community resource care guide to address needs related to  Transportation non medical, non cone  through collaboration with Consulting civil engineer, provider, and care team.  03/15/21 Goal ongoing for transportation: Pt was outreached by [facility] care Guide, discontinued from cone transportation services and accessing her medicaid benefit transportation ([employer] [place], [employer] and [employer]), senior resources of [place] volunteer transportation, car rentals, friends recommended 05/18/21 goal met continue to work with pain management and pcp on chronic pain- manageable after gabapentin prescribed, transportation is ongoing concern but pt has various available resources to choose to use   Interventions: Outreaches to re assess needs  offer suggestions, care coordination prn   Inter-disciplinary care team collaboration (see longitudinal plan of care) Evaluation of current treatment plan related to  self management and patient's adherence to plan as established by provider Answered questions about 24 hour nurse line, [facility] RN CM services 03/15/21 provided brainstormed various local rent to own companies, [place] billing number, Sent in mail information on CAP/DA after education provided) Encouraged her to outreach to [employer], [employer], [place] way for resources 04/26/21 rescheduled outreach per pt voice message to 05/04/21- reported back pain conflicts 6/0/73 unsuccessful outreach unable to leave a message as voice mailbox is full 05/15/21 calls to pcp to f/u on staff r/ community staff LVM for RN  05/16/21 collaboration with pcp RN r/t [employer], pt progression   Health Maintenance/community resources Interventions:  (Status:  Goal Met.) Short Term Goal Provide brainstormed resources to assist with possible transportation  needs 03/15/21 Goal ongoing for transportation: Pt was outreached by [place], discontinued from cone transportation services and accessing her medicaid benefit transportation ([employer] [place], [employer] and big wheels), senior resources of [place] volunteer transportation, Chief of Staff, friends RN CM Outreach to [employer] - confirmed pt does not have an assigned DSS staff for Medicaid of [place] plan and can use either [employer] [place] or [employer] [place] [phone] for medicaid transportation. Macie Burows confirms extended wait times to schedule transportation at [phone]. Macie Burows confirms patient eligible for transportation services from 9/22 to 10/2021 Encouraged pt to inquire about the name of her DSS staff, of CAP/DA, speak with an assigned DSS staff about resources for her, if available Encouraged her to follow up with list of rent to own vehicles companies  [employer] car donations suggested but the pt voiced she did not think a vehicle  from this resource would be reliable Suggest she work with previous Teacher, early years/pre her parents car was purchased from  Discuss her calling [employer] billing department to get a copy of her last admission bill by calling [phone] 05/15/21 Encouraged use of medicaid & confirmed [place] transportation is not an option as she has medicaid benefit Discussed home visiting MD and/or nurse services - She informs RN CM she does not feel she needs them RN CM discussed this may decrease the time and money used to rent vehicles, allow her to get labs completed at home, tests completed at home  Pain Interventions: Goal on track Long term goal - 05/18/21 Continues with chronic lower back pain level 6 Has pain management provider, treatment plan and medicines but with various cancelled appointments related to pain or transportation  Pain assessment performed Medications reviewed Reviewed provider established plan for pain management; Discussed importance of adherence to all scheduled medical appointments; Counseled on the importance of reporting any/all new or changed pain symptoms or management strategies to pain management provider; Discussed use of relaxation techniques and/or diversional activities to assist with pain reduction (distraction, imagery, relaxation, massage, acupressure, TENS, heat, and cold application; Reviewed with patient prescribed pharmacological and nonpharmacological pain relief strategies; Screening for signs and symptoms of depression related to chronic disease state;  Assessed social determinant of health barriers;  03/15/21 She called [place] to confirm her medicare covers chiropractic & medicine services and she will pay only $60 for being a loyal radio listener vs $160   Patient Goals/Self-Care Activities: Patient will self administer medications as prescribed Patient will attend all scheduled provider appointments Patient will call pharmacy for  medication refills Patient will continue to perform ADL's independently Patient will continue to perform IADL's independently Patient will call provider office for new concerns or questions Patient will follow her pain management treatment plan (Hydrocodone and nerve blocks when not on antibiotics-next scheduled 04/14/2021)per Dr Posey Pronto (last seen per patient on 11/25/20) and outreach to providers as needed  05/15/21 had gabapentin added by her pcp -reports helping 03/15/21 continue to work with [facility] (Dr Tessa Lerner) alternative/complementary medicine for pain management options 03/15/21 Patient will follow up on brainstormed suggestions for transportation (rent to own car companies, previous car dealer, CAP/DA services, [place] billing)-05/15/21 pt only followed up on transport companies  Follow Up Plan:  No further follow up required: No further follow up required: pt and RN CM agree on case closure for complex care. No preference for [facility] health coach/disease management at this time as  patient reports no medical needs unaddressed only ongoing transportation       Plan: No further follow up required: pt and RN CM agree on case closure for complex care. No preference for [facility] health coach/disease management at this time as patient reports no medical needs unaddressed only ongoing transportation  [place]. Lavina Hamman, RN, BSN, [place] Coordinator Office number [phone] [facility] number [phone] Fax number [phone]

## 2021-05-31 DIAGNOSIS — Z20822 Contact with and (suspected) exposure to covid-19: Secondary | ICD-10-CM | POA: Diagnosis not present

## 2021-05-31 NOTE — Telephone Encounter (Signed)
MD has reviewed results and made recommendations. Please see result note.  ?

## 2021-06-07 ENCOUNTER — Ambulatory Visit: Payer: Medicare Other | Admitting: Podiatry

## 2021-06-08 ENCOUNTER — Telehealth: Payer: Self-pay | Admitting: Internal Medicine

## 2021-06-08 DIAGNOSIS — I2510 Atherosclerotic heart disease of native coronary artery without angina pectoris: Secondary | ICD-10-CM

## 2021-06-08 NOTE — Telephone Encounter (Signed)
Pt reports no longer taking zetia d/t nausea, fever and increased GERD symptoms.  Since stopping medication pt no longer having symptoms.  Advised pt ok to stop med. ?

## 2021-06-08 NOTE — Telephone Encounter (Signed)
The patient has been notified of the result and verbalized understanding.  All questions (if any) were answered. ?Macie Burows, RN 06/08/2021 1:53 PM   ?Pt is requesting pharm d OV with [name].   ?

## 2021-06-08 NOTE — Telephone Encounter (Signed)
Patient returned call, transferred to RN.  

## 2021-06-08 NOTE — Telephone Encounter (Signed)
-----   Message from Werner Lean, MD sent at 05/28/2021  3:27 PM EST ----- ?LDL is 143 with known CAD. ?Would like patient to consider novel therapies. ?Patient has has had many drug interactions: Would we be able to get this patient on inclisiran infusion or would her insurance need [place] to try [id number] first? ? ?----- Message ----- ?From: Sallee Provencal L ?Sent: 05/24/2021  10:17 AM EST ?To: Werner Lean, MD ? ? ?

## 2021-06-13 ENCOUNTER — Telehealth: Payer: Self-pay

## 2021-06-13 NOTE — Telephone Encounter (Signed)
-----   Message from Ramond Dial, [place] sent at 06/13/2021  3:22 PM EDT ----- ?Will you schedule an apt for lipid clinic with me. Im pretty sure if you choose any day other than wed or thurs the 13th I will be there. ? ?

## 2021-06-13 NOTE — Telephone Encounter (Signed)
Unable to lmom for pharmd appt as mailbox is full  ?

## 2021-06-15 ENCOUNTER — Ambulatory Visit: Payer: Medicare Other | Admitting: Sports Medicine

## 2021-06-22 ENCOUNTER — Telehealth: Payer: Self-pay

## 2021-06-22 NOTE — Telephone Encounter (Signed)
Called and spoke to Jennifer [name] per Dr. Sharen Counter request to inform the pt that she will be the pharmacist seeing her. The pt stated that she was relieved as she has built a good relationship with her as a provider. I will route to Malena Peer to make her aware.  ?

## 2021-06-23 DIAGNOSIS — R399 Unspecified symptoms and signs involving the genitourinary system: Secondary | ICD-10-CM | POA: Diagnosis not present

## 2021-06-23 DIAGNOSIS — R7989 Other specified abnormal findings of blood chemistry: Secondary | ICD-10-CM | POA: Diagnosis not present

## 2021-07-05 DIAGNOSIS — Z20822 Contact with and (suspected) exposure to covid-19: Secondary | ICD-10-CM | POA: Diagnosis not present

## 2021-07-06 ENCOUNTER — Ambulatory Visit: Payer: Medicare Other

## 2021-07-11 NOTE — Progress Notes (Deleted)
?Electrophysiology Office Note:   ? ?Date:  07/11/2021  ? ?ID:  Jennifer [name], DOB [birth year], MRN [id number] ? ?PCP:  Marda Stalker, PA-C  ?[facility] Cardiologist:  Werner Lean, MD  ?[facility] Electrophysiologist:  Vickie Epley, MD  ? ?Referring MD: Darreld Mclean, PA-C  ? ?Chief Complaint: Atrial flutter ? ?History of Present Illness:   ? ?Jennifer [name] is a 76 y.o. female who presents for an evaluation of atrial flutter at the request of Jennifer Rives, PA-C. Their medical history includes chronic fatigue syndrome, fibromyalgia, hypertension, coronary artery disease, hypertension. ? ?Her diagnosis of atrial flutter dates back to October 2022 when she presented to the emergency department with palpitations.  EKG at that time showed SVT with ventricular rates in the 120s.  She was seen by general cardiology and started on Xarelto for stroke prophylaxis given a possible diagnosis of atrial flutter. ? ?She has since been transitioned to Eliquis for stroke prophylaxis. ? ?Past Medical History:  ?Diagnosis Date  ? Acute meniscal tear of left knee   ? FOLLOWED BY DR GIAFFREY  ? Atrial flutter ([place])   ? 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 BPPV (benign paroxysmal positional vertigo)   ? Chronic bladder pain   ? Chronic fatigue syndrome   ? Chronic low back pain   ? W/ RIGHT LEG PAIN AND NUMBNESS  ? Cystitis, chronic   ? Fibromyalgia   ? GERD (gastroesophageal reflux disease)   ? Hiatal hernia   ? Hypertension   ? IC (interstitial cystitis)   ? OSA (obstructive sleep apnea)   ? per pt study yrs ago-- moderate osa ,  intolerant cpap  ? Pinched vertebral nerve   ? bilateral L2 -- L3 and L3 -- L4-/  epidural injection's treatment , PT and pain clince  ? PONV (postoperative nausea and vomiting)   ? severe  ? S/P urinary bladder replacement   ? 1984  new bladder made from colon   ? Self-catheterizes urinary bladder   ? Spinal stenosis, lumbar region with neurogenic claudication   ? Wears glasses   ?  Wears partial dentures   ? upper and lower  ? ? ?Past Surgical History:  ?Procedure Laterality Date  ? ABDOMINAL AORTOGRAM N/A 09/16/2020  ? Procedure: ABDOMINAL AORTOGRAM;  Surgeon: Wellington Hampshire, MD;  Location: [facility];  Service: Cardiovascular;  Laterality: N/A;  ? ABDOMINAL HYSTERECTOMY  1980  ? w/ Left Salpingoophorectomy  ? CARDIAC CATHETERIZATION  08-21-2001  dr Daneen Schick  ? normal coronary arteries and LVF  ? CARPAL TUNNEL RELEASE Bilateral 1986  ? CECALCYSTOPLASTY/  APPENDECTOMY  1984  ? at [facility]  ? CHOLECYSTECTOMY  1992  ? CYSTO WITH HYDRODISTENSION N/A 01/12/2016  ? Procedure: CYSTOSCOPY/HYDRODISTENSION;  Surgeon: Carolan Clines, MD;  Location: [facility];  Service: Urology;  Laterality: N/A;  ? CYSTO WITH HYDRODISTENSION N/A 08/30/2016  ? Procedure: CYSTOSCOPY/HYDRODISTENSION OF BLADDER INJECTION OF MARCAINE/PYRIDIUM;  Surgeon: Carolan Clines, MD;  Location: [facility];  Service: Urology;  Laterality: N/A;  ? LEFT HEART CATH AND CORONARY ANGIOGRAPHY N/A 09/16/2020  ? Procedure: LEFT HEART CATH AND CORONARY ANGIOGRAPHY;  Surgeon: Wellington Hampshire, MD;  Location: [facility];  Service: Cardiovascular;  Laterality: N/A;  ? MULTIPLE CYSTO/  HYDRODISTENTION/  INSTILLATION THERAPY  last one 08-19-2009  ? NASAL SINUS SURGERY  1987  ? TONSILLECTOMY  as child  ? VAGINAL GROWTH REMOVED  as teen  ? ? ?  Current Medications: ?No outpatient medications have been marked as taking for the 07/12/21 encounter (Appointment) with Vickie Epley, MD.  ?  ? ?Allergies:   Moxifloxacin, Quinolones, Sulfa antibiotics, Furosemide, Aspirin, Doxazosin, Duloxetine hcl, Hydrochlorothiazide, Ibuprofen, Nebivolol hcl, Other, Ranexa [ranolazine], Robaxin [methocarbamol], Topiramate, Xarelto [rivaroxaban], Zofran [ondansetron hcl], Codeine, Dimethyl sulfoxide, Macrodantin [nitrofurantoin macrocrystal], Nitrofurantoin, Tape, and Yellow dye  ? ?Social History   ? ?Socioeconomic History  ? Marital status: Single  ?  Spouse name: none  ? Number of children: 0  ? Years of education: College  ? Highest education level: Not on file  ?Occupational History  ? Occupation: n/a  ?Tobacco Use  ? Smoking status: Never  ? Smokeless tobacco: Never  ?Vaping Use  ? Vaping Use: Never used  ?Substance and Sexual Activity  ? Alcohol use: No  ?  Alcohol/week: 0.0 standard drinks  ? Drug use: No  ? Sexual activity: Not on file  ?Other Topics Concern  ? Not on file  ?Social History Narrative  ? Lives alone  ? Jennifer Montana, RN   ? Caffeine use: 2 glasses tea/day  ? Brother  ? Mother passed in ?71 at 78, father passed in 50's in 2006  ? She was a Pharmacist, hospital  ?   ? ?Social Determinants of Health  ? ?Financial Resource Strain: Low Risk   ? Difficulty of Paying Living Expenses: Not hard at all  ?Food Insecurity: No Food Insecurity  ? Worried About Charity fundraiser in the Last Year: Never true  ? Ran Out of Food in the Last Year: Never true  ?Transportation Needs: Unmet Transportation Needs  ? Lack of Transportation (Medical): Yes  ? Lack of Transportation (Non-Medical): Yes  ?Physical Activity: Not on file  ?Stress: No Stress Concern Present  ? Feeling of Stress : Only a little  ?Social Connections: Moderately Integrated  ? Frequency of Communication with Friends and Family: Twice a week  ? Frequency of Social Gatherings with Friends and Family: Twice a week  ? Attends Religious Services: 1 to 4 times per year  ? Active Member of Clubs or Organizations: Yes  ? Attends Archivist Meetings: 1 to 4 times per year  ? Marital Status: Never married  ?  ? ?Family History: ?The patient's family history includes Alzheimer's disease in her father; Asthma in her father; CAD in her father; COPD in her father; Cancer in her brother; Colonic polyp in her mother; Emphysema in her father; Heart failure in her mother; Hypertension in her father and mother; Peripheral vascular disease in her mother;  Stroke in her mother. ? ?ROS:   ?Please see the history of present illness.    ?All other systems reviewed and are negative. ? ?EKGs/Labs/Other Studies Reviewed:   ? ?The following studies were reviewed today: ? ?January 10, 2021 echo ?Left ventricular function normal, 60% ?Severe concentric LVH ?Normal right ventricular function ?Mild aortic stenosis ? ?September 16, 2020 left heart catheterization ? Mid LAD lesion is 50% stenosed. ?3rd Diag lesion is 60% stenosed. ?Dist LAD-1 lesion is 90% stenosed. ?Dist LAD-2 lesion is 40% stenosed. ? 1.  Significant one-vessel coronary artery disease involving the mid to distal LAD at the bifurcation of the third diagonal which has also 60% ostial stenosis.  The coronary arteries are very tortuous suggestive of hypertensive heart disease. ?2.  Left ventricular angiography was not performed.  EF was normal by echo. ?3.  Severely elevated blood pressure with mildly elevated left ventricular end-diastolic pressure. ?4.  Transient left bundle branch block after engaging the left ventricle.  This resolved by the end of the case. ?5.  Abdominal aortogram was performed and showed no evidence of renal artery stenosis. ?  ? ?EKG:  The ekg ordered today demonstrates *** ? ? ?Recent Labs: ?09/25/2020: B Natriuretic Peptide 106.8 ?01/09/2021: ALT 19; BUN 12; Creatinine, Ser 0.88; Hemoglobin 11.8; Platelets 311; Potassium 4.0; Sodium 139 ?01/10/2021: Magnesium 2.1; TSH 1.973  ?Recent Lipid Panel ?   ?Component Value Date/Time  ? CHOL 132 01/11/2021 0223  ? TRIG 136 01/11/2021 0223  ? HDL 41 01/11/2021 0223  ? CHOLHDL 3.2 01/11/2021 0223  ? VLDL 27 01/11/2021 0223  ? [place] 64 01/11/2021 0223  ? ? ?Physical Exam:   ? ?VS:  There were no vitals taken for this visit.   ? ?Wt Readings from Last 3 Encounters:  ?05/11/21 191 lb (86.6 kg)  ?02/01/21 191 lb 3.2 oz (86.7 kg)  ?01/09/21 209 lb 7 oz (95 kg)  ?  ? ?GEN: *** Well nourished, well developed in no acute distress ?HEENT: Normal ?NECK: No JVD; No  carotid bruits ?LYMPHATICS: No lymphadenopathy ?CARDIAC: ***RRR, no murmurs, rubs, gallops ?RESPIRATORY:  Clear to auscultation without rales, wheezing or rhonchi  ?ABDOMEN: Soft, non-tender, non-distended ?M

## 2021-07-12 ENCOUNTER — Institutional Professional Consult (permissible substitution): Payer: Medicare Other | Admitting: Cardiology

## 2021-07-13 ENCOUNTER — Ambulatory Visit: Payer: Medicare Other | Admitting: Sports Medicine

## 2021-07-19 ENCOUNTER — Telehealth: Payer: Self-pay | Admitting: Pharmacist

## 2021-07-19 NOTE — Telephone Encounter (Signed)
New Message: ? ? ? ? ?Patient asked for you, she is cancelling her appointment next week. She would like for you to reschedule her with [name]. She wants to only see [name] please. ?

## 2021-07-20 ENCOUNTER — Ambulatory Visit: Payer: Medicare Other | Admitting: Sports Medicine

## 2021-07-20 NOTE — Telephone Encounter (Signed)
Called and lmom pt to call me back directly to r/s for Malena Peer rph specifically  ?

## 2021-07-20 NOTE — Telephone Encounter (Signed)
Called in and scheduled as requested  ?

## 2021-07-27 ENCOUNTER — Ambulatory Visit: Payer: Medicare Other

## 2021-07-27 ENCOUNTER — Ambulatory Visit: Payer: Medicare Other | Admitting: Sports Medicine

## 2021-07-28 DIAGNOSIS — Z79899 Other long term (current) drug therapy: Secondary | ICD-10-CM | POA: Diagnosis not present

## 2021-07-28 DIAGNOSIS — M7918 Myalgia, other site: Secondary | ICD-10-CM | POA: Diagnosis not present

## 2021-07-28 DIAGNOSIS — M47816 Spondylosis without myelopathy or radiculopathy, lumbar region: Secondary | ICD-10-CM | POA: Diagnosis not present

## 2021-07-28 DIAGNOSIS — Z20822 Contact with and (suspected) exposure to covid-19: Secondary | ICD-10-CM | POA: Diagnosis not present

## 2021-07-28 DIAGNOSIS — M48062 Spinal stenosis, lumbar region with neurogenic claudication: Secondary | ICD-10-CM | POA: Diagnosis not present

## 2021-07-28 DIAGNOSIS — M7061 Trochanteric bursitis, right hip: Secondary | ICD-10-CM | POA: Diagnosis not present

## 2021-07-28 DIAGNOSIS — G894 Chronic pain syndrome: Secondary | ICD-10-CM | POA: Diagnosis not present

## 2021-07-28 DIAGNOSIS — N390 Urinary tract infection, site not specified: Secondary | ICD-10-CM | POA: Diagnosis not present

## 2021-07-31 ENCOUNTER — Other Ambulatory Visit: Payer: Self-pay | Admitting: Physician Assistant

## 2021-07-31 DIAGNOSIS — M48062 Spinal stenosis, lumbar region with neurogenic claudication: Secondary | ICD-10-CM

## 2021-08-03 ENCOUNTER — Ambulatory Visit: Payer: Medicare Other | Admitting: Sports Medicine

## 2021-08-03 DIAGNOSIS — Z20822 Contact with and (suspected) exposure to covid-19: Secondary | ICD-10-CM | POA: Diagnosis not present

## 2021-08-06 DIAGNOSIS — Z20822 Contact with and (suspected) exposure to covid-19: Secondary | ICD-10-CM | POA: Diagnosis not present

## 2021-08-08 ENCOUNTER — Ambulatory Visit: Payer: Medicare Other | Admitting: Physician Assistant

## 2021-08-09 ENCOUNTER — Other Ambulatory Visit: Payer: Medicare Other

## 2021-08-09 DIAGNOSIS — Z20828 Contact with and (suspected) exposure to other viral communicable diseases: Secondary | ICD-10-CM | POA: Diagnosis not present

## 2021-08-10 ENCOUNTER — Ambulatory Visit: Payer: Medicare Other | Admitting: Sports Medicine

## 2021-08-16 ENCOUNTER — Ambulatory Visit: Payer: Medicare Other

## 2021-08-17 ENCOUNTER — Ambulatory Visit
Admission: RE | Admit: 2021-08-17 | Discharge: 2021-08-17 | Disposition: A | Discharge status: Home / Self Care | Payer: Medicare Other | Source: Ambulatory Visit | Attending: Physician Assistant | Admitting: Physician Assistant

## 2021-08-17 DIAGNOSIS — M48062 Spinal stenosis, lumbar region with neurogenic claudication: Secondary | ICD-10-CM

## 2021-08-17 DIAGNOSIS — M545 Low back pain, unspecified: Secondary | ICD-10-CM | POA: Diagnosis not present

## 2021-08-21 DIAGNOSIS — N390 Urinary tract infection, site not specified: Secondary | ICD-10-CM | POA: Diagnosis not present

## 2021-08-21 DIAGNOSIS — M797 Fibromyalgia: Secondary | ICD-10-CM | POA: Diagnosis not present

## 2021-08-23 ENCOUNTER — Telehealth: Payer: Self-pay | Admitting: Internal Medicine

## 2021-08-23 DIAGNOSIS — M48062 Spinal stenosis, lumbar region with neurogenic claudication: Secondary | ICD-10-CM | POA: Diagnosis not present

## 2021-08-23 DIAGNOSIS — M533 Sacrococcygeal disorders, not elsewhere classified: Secondary | ICD-10-CM | POA: Diagnosis not present

## 2021-08-23 DIAGNOSIS — G894 Chronic pain syndrome: Secondary | ICD-10-CM | POA: Diagnosis not present

## 2021-08-23 DIAGNOSIS — M7918 Myalgia, other site: Secondary | ICD-10-CM | POA: Diagnosis not present

## 2021-08-23 NOTE — Telephone Encounter (Signed)
  Pt is requesting to speak with [facility], she asked if she can speak with her before she leaves for the day

## 2021-08-23 NOTE — Telephone Encounter (Signed)
Pt calls in c/o heart pounding, extreme fatigue, chest pressure/ heaviness/ and burning.  Also, reports had jaw pain at the end of last week.  Reports jaw pain occurred 1-2 times.  Pt becomes SOB with talking.  BP 125/58 HR 60's-90's today.  Symptoms began after having a MRI last Thursday.  Pt wants to know if should tough it out or go to the emergency room.  I advised pt to call 911 to be evaluated. Pt will go to the ED for evaluation.

## 2021-08-24 ENCOUNTER — Ambulatory Visit: Payer: Medicare Other | Admitting: Sports Medicine

## 2021-08-24 ENCOUNTER — Encounter (HOSPITAL_COMMUNITY): Payer: Self-pay

## 2021-08-24 ENCOUNTER — Ambulatory Visit (INDEPENDENT_AMBULATORY_CARE_PROVIDER_SITE_OTHER): Payer: Medicare Other

## 2021-08-24 ENCOUNTER — Inpatient Hospital Stay (HOSPITAL_COMMUNITY)
Admission: EM | Admit: 2021-08-24 | Discharge: 2021-08-28 | DRG: 313 | Disposition: A | Discharge status: Home / Self Care | Payer: Medicare Other | Attending: Internal Medicine | Admitting: Internal Medicine

## 2021-08-24 ENCOUNTER — Ambulatory Visit (INDEPENDENT_AMBULATORY_CARE_PROVIDER_SITE_OTHER): Payer: Medicare Other | Admitting: Sports Medicine

## 2021-08-24 ENCOUNTER — Emergency Department (HOSPITAL_COMMUNITY): Payer: Medicare Other

## 2021-08-24 DIAGNOSIS — I4892 Unspecified atrial flutter: Secondary | ICD-10-CM

## 2021-08-24 DIAGNOSIS — M2141 Flat foot [pes planus] (acquired), right foot: Secondary | ICD-10-CM | POA: Diagnosis not present

## 2021-08-24 DIAGNOSIS — M2142 Flat foot [pes planus] (acquired), left foot: Secondary | ICD-10-CM

## 2021-08-24 DIAGNOSIS — Z79899 Other long term (current) drug therapy: Secondary | ICD-10-CM | POA: Diagnosis not present

## 2021-08-24 DIAGNOSIS — R079 Chest pain, unspecified: Secondary | ICD-10-CM | POA: Diagnosis not present

## 2021-08-24 DIAGNOSIS — Z825 Family history of asthma and other chronic lower respiratory diseases: Secondary | ICD-10-CM

## 2021-08-24 DIAGNOSIS — Z823 Family history of stroke: Secondary | ICD-10-CM

## 2021-08-24 DIAGNOSIS — K222 Esophageal obstruction: Secondary | ICD-10-CM | POA: Diagnosis not present

## 2021-08-24 DIAGNOSIS — K449 Diaphragmatic hernia without obstruction or gangrene: Secondary | ICD-10-CM | POA: Diagnosis not present

## 2021-08-24 DIAGNOSIS — I509 Heart failure, unspecified: Secondary | ICD-10-CM | POA: Diagnosis not present

## 2021-08-24 DIAGNOSIS — M79672 Pain in left foot: Secondary | ICD-10-CM

## 2021-08-24 DIAGNOSIS — M2021 Hallux rigidus, right foot: Secondary | ICD-10-CM | POA: Diagnosis not present

## 2021-08-24 DIAGNOSIS — I200 Unstable angina: Secondary | ICD-10-CM | POA: Diagnosis not present

## 2021-08-24 DIAGNOSIS — I160 Hypertensive urgency: Secondary | ICD-10-CM | POA: Diagnosis present

## 2021-08-24 DIAGNOSIS — M79671 Pain in right foot: Secondary | ICD-10-CM

## 2021-08-24 DIAGNOSIS — I110 Hypertensive heart disease with heart failure: Secondary | ICD-10-CM | POA: Diagnosis not present

## 2021-08-24 DIAGNOSIS — K219 Gastro-esophageal reflux disease without esophagitis: Secondary | ICD-10-CM | POA: Diagnosis present

## 2021-08-24 DIAGNOSIS — M797 Fibromyalgia: Secondary | ICD-10-CM | POA: Diagnosis present

## 2021-08-24 DIAGNOSIS — G894 Chronic pain syndrome: Secondary | ICD-10-CM

## 2021-08-24 DIAGNOSIS — M2041 Other hammer toe(s) (acquired), right foot: Secondary | ICD-10-CM | POA: Diagnosis not present

## 2021-08-24 DIAGNOSIS — I484 Atypical atrial flutter: Secondary | ICD-10-CM | POA: Diagnosis not present

## 2021-08-24 DIAGNOSIS — I2510 Atherosclerotic heart disease of native coronary artery without angina pectoris: Secondary | ICD-10-CM

## 2021-08-24 DIAGNOSIS — G4733 Obstructive sleep apnea (adult) (pediatric): Secondary | ICD-10-CM | POA: Diagnosis present

## 2021-08-24 DIAGNOSIS — K317 Polyp of stomach and duodenum: Secondary | ICD-10-CM

## 2021-08-24 DIAGNOSIS — Z885 Allergy status to narcotic agent status: Secondary | ICD-10-CM | POA: Diagnosis not present

## 2021-08-24 DIAGNOSIS — K259 Gastric ulcer, unspecified as acute or chronic, without hemorrhage or perforation: Secondary | ICD-10-CM | POA: Diagnosis not present

## 2021-08-24 DIAGNOSIS — Z888 Allergy status to other drugs, medicaments and biological substances status: Secondary | ICD-10-CM | POA: Diagnosis not present

## 2021-08-24 DIAGNOSIS — Z8249 Family history of ischemic heart disease and other diseases of the circulatory system: Secondary | ICD-10-CM | POA: Diagnosis not present

## 2021-08-24 DIAGNOSIS — I10 Essential (primary) hypertension: Secondary | ICD-10-CM | POA: Diagnosis not present

## 2021-08-24 DIAGNOSIS — Z7901 Long term (current) use of anticoagulants: Secondary | ICD-10-CM

## 2021-08-24 DIAGNOSIS — M19072 Primary osteoarthritis, left ankle and foot: Secondary | ICD-10-CM

## 2021-08-24 DIAGNOSIS — E785 Hyperlipidemia, unspecified: Secondary | ICD-10-CM | POA: Diagnosis present

## 2021-08-24 DIAGNOSIS — R0789 Other chest pain: Secondary | ICD-10-CM | POA: Diagnosis not present

## 2021-08-24 DIAGNOSIS — I209 Angina pectoris, unspecified: Secondary | ICD-10-CM | POA: Diagnosis not present

## 2021-08-24 DIAGNOSIS — I499 Cardiac arrhythmia, unspecified: Secondary | ICD-10-CM | POA: Diagnosis not present

## 2021-08-24 DIAGNOSIS — L84 Corns and callosities: Secondary | ICD-10-CM

## 2021-08-24 DIAGNOSIS — R1310 Dysphagia, unspecified: Secondary | ICD-10-CM | POA: Diagnosis present

## 2021-08-24 DIAGNOSIS — R933 Abnormal findings on diagnostic imaging of other parts of digestive tract: Secondary | ICD-10-CM | POA: Diagnosis not present

## 2021-08-24 DIAGNOSIS — M2042 Other hammer toe(s) (acquired), left foot: Secondary | ICD-10-CM | POA: Diagnosis not present

## 2021-08-24 DIAGNOSIS — I959 Hypotension, unspecified: Secondary | ICD-10-CM | POA: Diagnosis not present

## 2021-08-24 DIAGNOSIS — Z91048 Other nonmedicinal substance allergy status: Secondary | ICD-10-CM

## 2021-08-24 DIAGNOSIS — I25119 Atherosclerotic heart disease of native coronary artery with unspecified angina pectoris: Secondary | ICD-10-CM | POA: Diagnosis not present

## 2021-08-24 LAB — COMPREHENSIVE METABOLIC PANEL
ALT: 19 U/L (ref 0–44)
AST: 22 U/L (ref 15–41)
Albumin: 3.7 g/dL (ref 3.5–5.0)
Alkaline Phosphatase: 57 U/L (ref 38–126)
Anion gap: 8 (ref 5–15)
BUN: 12 mg/dL (ref 8–23)
CO2: 23 mmol/L (ref 22–32)
Calcium: 9 mg/dL (ref 8.9–10.3)
Chloride: 109 mmol/L (ref 98–111)
Creatinine, Ser: 0.92 mg/dL (ref 0.44–1.00)
GFR, Estimated: >60 mL/min (ref >60)
Glucose, Bld: 106 mg/dL — ABNORMAL HIGH (ref 70–99)
Potassium: 3.8 mmol/L (ref 3.5–5.1)
Sodium: 140 mmol/L (ref 135–145)
Total Bilirubin: 0.8 mg/dL (ref 0.3–1.2)
Total Protein: 6.7 g/dL (ref 6.5–8.1)

## 2021-08-24 LAB — CBC WITH DIFFERENTIAL/PLATELET
Abs Immature Granulocytes: 0.01 10*3/uL (ref 0.00–0.07)
Basophils Absolute: 0 10*3/uL (ref 0.0–0.1)
Basophils Relative: 0 %
Eosinophils Absolute: 0.1 10*3/uL (ref 0.0–0.5)
Eosinophils Relative: 1 %
HCT: 32.2 % — ABNORMAL LOW (ref 36.0–46.0)
Hemoglobin: 10 g/dL — ABNORMAL LOW (ref 12.0–15.0)
Immature Granulocytes: 0 %
Lymphocytes Relative: 21 %
Lymphs Abs: 1.4 10*3/uL (ref 0.7–4.0)
MCH: 26.3 pg (ref 26.0–34.0)
MCHC: 31.1 g/dL (ref 30.0–36.0)
MCV: 84.7 fL (ref 80.0–100.0)
Monocytes Absolute: 0.7 10*3/uL (ref 0.1–1.0)
Monocytes Relative: 9 %
Neutro Abs: 4.8 10*3/uL (ref 1.7–7.7)
Neutrophils Relative %: 69 %
Platelets: 280 10*3/uL (ref 150–400)
RBC: 3.8 MIL/uL — ABNORMAL LOW (ref 3.87–5.11)
RDW: 14.8 % (ref 11.5–15.5)
WBC: 7 10*3/uL (ref 4.0–10.5)
nRBC: 0 % (ref 0.0–0.2)

## 2021-08-24 LAB — TROPONIN I (HIGH SENSITIVITY): Troponin I (High Sensitivity): 13 ng/L (ref <18)

## 2021-08-24 NOTE — Patient Instructions (Signed)
Topical voltaren gel 1%, may purchase OTC to use as needed for pain or discomfort at [facility]  Recommend Toe crest pad for left 3rd toe; may purchase more from office when needed for your hammertoe

## 2021-08-24 NOTE — ED Provider Notes (Signed)
[facility] EMERGENCY DEPARTMENT Provider Note   CSN: [id number] Arrival date & time: 08/24/21  2051     History  No chief complaint on file.   Jennifer [name] is a 76 y.o. female history of A-fib on Eliquis, hypertension, CAD here presenting with chest pain.  Patient states that she has longstanding history of CAD.  She was cath about a year ago that showed significant LAD disease but she has very tortuous vessels so that was not amenable for stenting.  Patient therefore was treated medically.  She states that for the last 6 days she has intermittent chest pain.  She describes it as a pressure sensation in the associated with some palpitations.  She states that exertion makes it worse.  She has been taking her nitros more frequently which helped with her pain.  She also called her cardiologist was sent in for further evaluation.  Patient was given 2 nitro prior to arrival and she states that she does not have any pain right now.  The history is provided by the patient.      Home Medications Prior to Admission medications   Medication Sig Start Date End Date Taking? Authorizing Provider  acetaminophen (TYLENOL) 500 MG tablet Take 500 mg by mouth 2 (two) times daily as needed (for pain).    [provider]  apixaban (ELIQUIS) 5 MG TABS tablet Take 1 tablet (5 mg total) by mouth 2 (two) times daily. 02/01/21   [name], Tacey Ruiz, PA-C  apixaban (ELIQUIS) 5 MG TABS tablet 1 tablet    [provider]  dextromethorphan-guaiFENesin (MUCINEX DM) 30-600 MG 12hr tablet Take 1 tablet by mouth every 12 (twelve) hours as needed for cough.    [provider]  doxazosin (CARDURA) 1 MG tablet Take 1 tablet by mouth daily. 10/26/20   [provider]  ezetimibe (ZETIA) 10 MG tablet Take 1 tablet (10 mg total) by mouth daily. Patient not taking: Reported on 06/08/2021 05/11/21   Riley Lam A, MD  fexofenadine (ALLEGRA) 60 MG tablet Take 60 mg by mouth  See admin instructions. Take 60 mg by mouth one to two times a day as needed for allergy symptoms 01/06/14   [provider]  fosfomycin (MONUROL) 3 g PACK SMARTSIG:1 By Mouth Every 3 Days 08/01/21   [provider]  gabapentin (NEURONTIN) 100 MG capsule daily at 6 (six) AM. 04/27/21   [provider]  gabapentin (NEURONTIN) 300 MG capsule Take 300 mg by mouth 2 (two) times daily. 07/28/21   [provider]  hydrALAZINE (APRESOLINE) 50 MG tablet Take 1 tablet (50 mg total) by mouth 3 (three) times daily. 12/09/20   Riley Lam A, MD  hydrALAZINE (APRESOLINE) 50 MG tablet Take by mouth. 11/01/20   [provider]  HYDROcodone-acetaminophen (NORCO) 7.5-325 MG tablet Take 1 tablet by mouth every 8 (eight) hours as needed for moderate pain or severe pain. 12/22/20   [provider]  hydrocortisone (ANUSOL-HC) 25 MG suppository Place 25 mg rectally 2 (two) times daily as needed for hemorrhoids. 12/07/20   [provider]  isosorbide dinitrate (ISORDIL) 10 MG tablet TAKE 1 TABLET BY MOUTH THREE TIMES A DAY 12/19/20   Chandrasekhar, Mahesh A, MD  losartan (COZAAR) 100 MG tablet 50 mg daily. 01/03/17   [provider]  losartan (COZAAR) 50 MG tablet Take 1 tablet (50 mg total) by mouth daily. 02/01/21 05/02/21  [name], Tacey Ruiz, PA-C  meclizine (ANTIVERT) 25 MG tablet Take 25  mg by mouth 3 (three) times daily as needed for dizziness.    [provider]  Meth-Hyo-M Bl-Benz Acd-Ph Sal 81.6 MG TABS Take 1 tablet (81.6 mg total) by mouth 2 (two) times daily as needed. 06/07/17   Ginnie SmartHatcher, Jeffrey C, MD  Methen-Hyosc-Meth Blue-Na Phos (UROGESIC-BLUE) 81.6 MG TABS TAKE 1 TABLET BY MOUTH EVERY 6 HOURS AS NEEDED BURNING 08/21/21   [provider]  metoprolol tartrate (LOPRESSOR) 25 MG tablet Take 1 tablet (25 mg total) by mouth 2 (two) times daily. 02/10/21   Chandrasekhar, Mahesh A, MD  nebivolol (BYSTOLIC) 5 MG tablet 1 tablet (5 mg total)  daily. 06/18/16   [provider]  nitroGLYCERIN (NITROSTAT) 0.4 MG SL tablet Place 1 tablet (0.4 mg total) under the tongue every 5 (five) minutes as needed for chest pain. Take up to 3 tablets 01/18/21   Chandrasekhar, Mahesh A, MD  nystatin cream (MYCOSTATIN) Apply topically 2 (two) times daily. 08/04/20   Ginnie SmartHatcher, Jeffrey C, MD  omeprazole (PRILOSEC) 20 MG capsule Take 20 mg by mouth See admin instructions. Take 20 mg by mouth in the morning one hour before breakfast and an additional 20 mg before evening meal as needed for GERD 02/08/14   [provider]  ondansetron (ZOFRAN-ODT) 4 MG disintegrating tablet Take 4 mg by mouth every 8 (eight) hours as needed for nausea (dissolve orally). 07/01/17   [provider]  polyethylene glycol (MIRALAX / GLYCOLAX) 17 g packet Take 17 g by mouth as needed.    [provider]  polyethylene glycol powder (GLYCOLAX/MIRALAX) 17 GM/SCOOP powder Take by mouth.    [provider]  Probiotic Product (ADVANCED PROBIOTIC-14) CAPS Yogurt probiotic 10/15/17   [provider]  promethazine (PHENERGAN) 25 MG tablet Take by mouth. 10/20/20   [provider]  senna (SENOKOT) 8.6 MG tablet Take 1 tablet by mouth daily.    [provider]  spironolactone (ALDACTONE) 25 MG tablet Take 1 tablet (25 mg total) by mouth daily. 11/03/20   Chandrasekhar, Mahesh A, MD  sucralfate (CARAFATE) 1 G tablet Take 1 g by mouth See admin instructions. Take 1 gram by mouth in the morning and an additional 1 gram once a day for flares    [provider]  triamcinolone cream (KENALOG) 0.1 % Apply topically 2 (two) times daily as needed. 04/06/21   [provider]      Allergies    Moxifloxacin, Quinolones, Sulfa antibiotics, Furosemide, Aspirin, Doxazosin, Duloxetine hcl, Hydrochlorothiazide, Ibuprofen, Nebivolol hcl, Other, Ranexa [ranolazine], Robaxin [methocarbamol], Topiramate, Xarelto [rivaroxaban], Zofran  [ondansetron hcl], Codeine, Dimethyl sulfoxide, Macrodantin [nitrofurantoin macrocrystal], Nitrofurantoin, Tape, and Yellow dye    Review of Systems   Review of Systems  Cardiovascular:  Positive for chest pain.  All other systems reviewed and are negative.  Physical Exam Updated Vital Signs BP (!) 172/63   Pulse 64   Temp 98.3 F (36.8 C) (Oral)   Resp 15   Ht 5\' 4"  (1.626 m)   Wt 86.6 kg   SpO2 97%   BMI 32.77 kg/m  Physical Exam Vitals and nursing note reviewed.  Constitutional:      Comments: Chronically ill  HENT:     Head: Normocephalic.     Nose: Nose normal.     Mouth/Throat:     Mouth: Mucous membranes are moist.  Eyes:     Extraocular Movements: Extraocular movements intact.     Pupils: Pupils are equal, round, and reactive to light.  Cardiovascular:  Rate and Rhythm: Normal rate and regular rhythm.     Pulses: Normal pulses.     Heart sounds: Normal heart sounds.  Pulmonary:     Effort: Pulmonary effort is normal.     Breath sounds: Normal breath sounds.  Abdominal:     General: Abdomen is flat.     Palpations: Abdomen is soft.  Musculoskeletal:        General: Normal range of motion.     Cervical back: Normal range of motion and neck supple.  Skin:    General: Skin is warm.     Capillary Refill: Capillary refill takes less than 2 seconds.  Neurological:     General: No focal deficit present.     Mental Status: She is oriented to person, place, and time.  Psychiatric:        Mood and Affect: Mood normal.        Behavior: Behavior normal.    ED Results / Procedures / Treatments   Labs (all labs ordered are listed, but only abnormal results are displayed) Labs Reviewed  CBC WITH DIFFERENTIAL/PLATELET - Abnormal; Notable for the following components:      Result Value   RBC 3.80 (*)    Hemoglobin 10.0 (*)    HCT 32.2 (*)    All other components within normal limits  COMPREHENSIVE METABOLIC PANEL - Abnormal; Notable for the following  components:   Glucose, Bld 106 (*)    All other components within normal limits  TROPONIN I (HIGH SENSITIVITY)  TROPONIN I (HIGH SENSITIVITY)    EKG EKG Interpretation  Date/Time:  Thursday Aug 24 2021 21:09:01 EDT Ventricular Rate:  67 PR Interval:  178 QRS Duration: 95 QT Interval:  398 QTC Calculation: 421 R Axis:   -4 Text Interpretation: Sinus rhythm Probable left atrial enlargement Consider anterior infarct No significant change since last tracing Confirmed by Richardean Canal [phone]) on 08/24/2021 9:27:20 PM  Radiology DG Chest Port 1 View  Result Date: 08/24/2021 CLINICAL DATA:  Chest pain EXAM: PORTABLE CHEST 1 VIEW COMPARISON:  01/09/2021 FINDINGS: Lungs are clear.  No pleural effusion or pneumothorax. The heart is normal in size. Mild degenerative changes of the thoracic spine. IMPRESSION: No evidence of acute cardiopulmonary disease. Electronically Signed   By: Charline Bills M.D.   On: 08/24/2021 21:14    Procedures Procedures    Medications Ordered in ED Medications - No data to display  ED Course/ Medical Decision Making/ A&P                           Medical Decision Making Jennifer [name] is a 76 y.o. female here presenting with chest pain.  She has significant CAD that is not amenable to stenting.  Patient took 2 nitro and is pain-free now.  Patient likely has unstable anginal symptoms.  Given her CAD, will need to consult cardiology.  Likely she will need admission for rule out ACS  10:45 PM I reviewed patient's labs and independently reviewed her chest x-ray.  Her troponin is negative and her EKG showed no obvious ischemic changes.  Patient is pain-free now.  I discussed case with Dr. Hyacinth Meeker from cardiology.  He agreed with medical admission and cardiology will see patient in the morning.  Since patient is pain-free, will hold off on heparin or nitro drip for now.  Hospitalist to admit for unstable angina   Problems Addressed: Unstable angina [facility]): acute  illness or injury  Amount and/or Complexity of Data Reviewed Labs: ordered. Decision-making details documented in ED Course. Radiology: ordered and independent interpretation performed. Decision-making details documented in ED Course. ECG/medicine tests: ordered and independent interpretation performed. Decision-making details documented in ED Course.  Risk Decision regarding hospitalization.    Final Clinical Impression(s) / ED Diagnoses Final diagnoses:  None    Rx / DC Orders ED Discharge Orders     None         Charlynne Pander, MD 08/24/21 2246

## 2021-08-24 NOTE — Progress Notes (Signed)
Subjective: Jennifer [name] is a 76 y.o. female patient who presents to office for evaluation of 1. occasional foot pain at top of left foot and big toe on right. Patient admits an old stump injury in Feb and some stiffness, 2. Corn to left 3rd toe. Denies any other pedal complaints.    Patient Active Problem List   Diagnosis Date Noted   Typical atrial flutter (HCC) 05/11/2021   Aortic valve disorder 02/13/2021   Atherosclerotic heart disease of native coronary artery without angina pectoris 02/13/2021   Chest pain 01/10/2021   Chronic buttock pain 11/25/2020   Hypertensive crisis 09/16/2020   Unstable angina (HCC)    Ischemic chest pain (HCC) 09/15/2020   Hypertensive emergency 09/15/2020   Hypertensive urgency 09/15/2020   Interstitial cystitis (chronic) without hematuria 09/15/2020   Chronic low back pain 09/15/2020   Chronic diastolic CHF (congestive heart failure) (HCC) 09/15/2020   Heart murmur 09/08/2020   Allergic rhinitis 08/17/2020   Altered bowel function 08/17/2020   Anxiety disorder 08/17/2020   Body mass index (BMI) 35.0-35.9, adult 08/17/2020   Chronic fatigue syndrome 08/17/2020   Colon cancer screening 08/17/2020   Functional diarrhea 08/17/2020   Hypothyroidism 08/17/2020   Intestinal gas excretion 08/17/2020   Localized obesity 08/17/2020   Nausea 08/17/2020   Polypharmacy 08/17/2020   Posttraumatic headache 08/17/2020   Posttraumatic vertigo 08/17/2020   Pure hypercholesterolemia 08/17/2020   Lower abdominal pain 08/17/2020   Scoliosis 08/17/2020   Sleep apnea 08/17/2020   Unspecified abnormal finding in specimens from other organs, systems and tissues 08/17/2020   Vitamin D deficiency 08/17/2020   Back pain 04/28/2020   Candidiasis 10/15/2019   Encounter for orthopedic follow-up care 08/12/2018   Vertigo 02/26/2018   Degenerative arthritis of right knee 02/19/2018   Urinary tract infection, site not specified 07/04/2017   Arthralgia of both knees  01/23/2017   Facet arthritis, degenerative, lumbar spine 01/23/2017   Benign paroxysmal positional vertigo 07/03/2016   IBS (irritable bowel syndrome) 04/19/2015   GERD (gastroesophageal reflux disease) 03/21/2015   Fibromyalgia 03/21/2015   Recurrent bacterial cystitis 03/21/2015   HTN (hypertension) 03/21/2015   Fibrositis 03/21/2015   SI (sacroiliac) pain 12/16/2014   DDD (degenerative disc disease), lumbar 05/11/2014   Facet arthropathy 05/11/2014   Spinal stenosis of lumbar region 05/11/2014    Current Outpatient Medications on File Prior to Visit  Medication Sig Dispense Refill   doxazosin (CARDURA) 1 MG tablet Take 1 tablet by mouth daily.     hydrALAZINE (APRESOLINE) 50 MG tablet Take by mouth.     losartan (COZAAR) 100 MG tablet 50 mg daily.     Methen-Hyosc-Meth Blue-Na Phos (UROGESIC-BLUE) 81.6 MG TABS TAKE 1 TABLET BY MOUTH EVERY 6 HOURS AS NEEDED BURNING     nebivolol (BYSTOLIC) 5 MG tablet 1 tablet (5 mg total) daily.     Probiotic Product (ADVANCED PROBIOTIC-14) CAPS Yogurt probiotic     promethazine (PHENERGAN) 25 MG tablet Take by mouth.     acetaminophen (TYLENOL) 500 MG tablet Take 500 mg by mouth 2 (two) times daily as needed (for pain).     apixaban (ELIQUIS) 5 MG TABS tablet Take 1 tablet (5 mg total) by mouth 2 (two) times daily. 60 tablet 11   apixaban (ELIQUIS) 5 MG TABS tablet 1 tablet     dextromethorphan-guaiFENesin (MUCINEX DM) 30-600 MG 12hr tablet Take 1 tablet by mouth every 12 (twelve) hours as needed for cough.     ezetimibe (ZETIA) 10 MG tablet Take  1 tablet (10 mg total) by mouth daily. (Patient not taking: Reported on 06/08/2021) 90 tablet 3   fexofenadine (ALLEGRA) 60 MG tablet Take 60 mg by mouth See admin instructions. Take 60 mg by mouth one to two times a day as needed for allergy symptoms     fosfomycin (MONUROL) 3 g PACK SMARTSIG:1 By Mouth Every 3 Days     gabapentin (NEURONTIN) 100 MG capsule daily at 6 (six) AM.     gabapentin (NEURONTIN)  300 MG capsule Take 300 mg by mouth 2 (two) times daily.     hydrALAZINE (APRESOLINE) 50 MG tablet Take 1 tablet (50 mg total) by mouth 3 (three) times daily. 405 tablet 3   HYDROcodone-acetaminophen (NORCO) 7.5-325 MG tablet Take 1 tablet by mouth every 8 (eight) hours as needed for moderate pain or severe pain.     hydrocortisone (ANUSOL-HC) 25 MG suppository Place 25 mg rectally 2 (two) times daily as needed for hemorrhoids.     isosorbide dinitrate (ISORDIL) 10 MG tablet TAKE 1 TABLET BY MOUTH THREE TIMES A DAY 270 tablet 3   losartan (COZAAR) 50 MG tablet Take 1 tablet (50 mg total) by mouth daily. 90 tablet 3   meclizine (ANTIVERT) 25 MG tablet Take 25 mg by mouth 3 (three) times daily as needed for dizziness.     Meth-Hyo-M Bl-Benz Acd-Ph Sal 81.6 MG TABS Take 1 tablet (81.6 mg total) by mouth 2 (two) times daily as needed. 30 each 1   metoprolol tartrate (LOPRESSOR) 25 MG tablet Take 1 tablet (25 mg total) by mouth 2 (two) times daily. 180 tablet 2   nitroGLYCERIN (NITROSTAT) 0.4 MG SL tablet Place 1 tablet (0.4 mg total) under the tongue every 5 (five) minutes as needed for chest pain. Take up to 3 tablets 25 tablet 0   nystatin cream (MYCOSTATIN) Apply topically 2 (two) times daily. 30 g 3   omeprazole (PRILOSEC) 20 MG capsule Take 20 mg by mouth See admin instructions. Take 20 mg by mouth in the morning one hour before breakfast and an additional 20 mg before evening meal as needed for GERD     ondansetron (ZOFRAN-ODT) 4 MG disintegrating tablet Take 4 mg by mouth every 8 (eight) hours as needed for nausea (dissolve orally).  1   polyethylene glycol (MIRALAX / GLYCOLAX) 17 g packet Take 17 g by mouth as needed.     polyethylene glycol powder (GLYCOLAX/MIRALAX) 17 GM/SCOOP powder Take by mouth.     senna (SENOKOT) 8.6 MG tablet Take 1 tablet by mouth daily.     spironolactone (ALDACTONE) 25 MG tablet Take 1 tablet (25 mg total) by mouth daily. 90 tablet 3   sucralfate (CARAFATE) 1 G tablet  Take 1 g by mouth See admin instructions. Take 1 gram by mouth in the morning and an additional 1 gram once a day for flares     triamcinolone cream (KENALOG) 0.1 % Apply topically 2 (two) times daily as needed.     No current facility-administered medications on file prior to visit.    Allergies  Allergen Reactions   Moxifloxacin Hives, Shortness Of Breath, Swelling and Rash    Rash was severe, caused tremors, ans skin became BLOODY RED   Quinolones Anaphylaxis   Sulfa Antibiotics Hives and Rash   Furosemide Other (See Comments)    CANNOT TAKE DUE TO Interstitial cystitis   Aspirin Nausea Only and Other (See Comments)    GI and stomach upset   Doxazosin  constipation   Duloxetine Hcl Nausea Only and Other (See Comments)    Vertigo and heart racing also    Hydrochlorothiazide Other (See Comments)    Headaches    Ibuprofen Nausea Only and Other (See Comments)    GI upset   Nebivolol Hcl     Other reaction(s): extreme fatigue   Other Other (See Comments)   Ranexa [Ranolazine]     Constipation/heart pounding   Robaxin [Methocarbamol] Other (See Comments)    "Make me feel flu-like symptoms"   Topiramate Itching   Xarelto [Rivaroxaban]     Back pain   Zofran [Ondansetron Hcl] Other (See Comments)    Headaches    Codeine Hives, Nausea And Vomiting and Rash   Dimethyl Sulfoxide Hives and Itching   Macrodantin [Nitrofurantoin Macrocrystal] Itching, Nausea And Vomiting and Rash    Abdominal and chest pain, also- blood-red skin, also   Nitrofurantoin Diarrhea, Itching, Rash and Other (See Comments)    GI/stomach upset, sweating, chest pain, and skin turned bloody red   Tape Rash   Yellow Dye Rash    Objective:  General: Alert and oriented x3 in no acute distress  Dermatology: No open lesions bilateral lower extremities, no webspace macerations, no ecchymosis bilateral, all nails x 10 are well manicured. + corn to distal tuft of the left 3rd toe.   Vascular: Dorsalis  Pedis and Posterior Tibial pedal pulses palpable, Capillary Fill Time 3 seconds,(+) pedal hair growth bilateral, no edema bilateral lower extremities, Temperature gradient within normal limits.  Neurology: Michaell CowingGross sensation intact via light touch bilateral.   Musculoskeletal: Mild tenderness with palpation at dorsal midfoot on the left greater than right foot with flexible pes planus deformity and significant digital deformity noted.  There is limited first MPJ range of motion noted on the right consistent with hallux rigidus.   Gait: Non- Antalgic gait  Xrays  Left foot   Impression: Midfoot breech supportive of pes planus deformity with arthritis, digital deformity, no fracture or dislocation noted.  Assessment and Plan: Problem List Items Addressed This Visit   None Visit Diagnoses     Arthritis of foot, left    -  Primary   Relevant Orders   DG Foot Complete Left   Corn of toe       Pes planus of both feet       Hammer toes of both feet       Foot pain, bilateral       Hallux rigidus, right foot             -Complete examination performed -Xrays reviewed -Discussed treatement options for likely occasional pain related to arthritis -Recommend over-the-counter Voltaren gel to use as directed -Recommend daily skin emollients for dry skin/corn and use of toe crest pad as dispensed for the left third toe -Recommend good supportive shoes daily for foot type -Patient to return to office as needed or sooner if condition worsens.  Asencion Islamitorya Arlind Klingerman, DPM

## 2021-08-24 NOTE — ED Triage Notes (Signed)
Pt bib [facility] from home for intermittent chest pain that has been ongoing for past 6 days. Initial EMS BP 190 systolic, last BP 111/42 after 1 nitro tablet. 22G IV in left forearm, 50-137mL NS given. Pt takes eliquis and has hx GERD, no aspirin given by EMS for this reason, pt also reports a "known occlusion in her heart." A&O x4  EMS vitals Pulse 60s 95% room air

## 2021-08-25 ENCOUNTER — Inpatient Hospital Stay (HOSPITAL_COMMUNITY): Payer: Medicare Other

## 2021-08-25 ENCOUNTER — Observation Stay (HOSPITAL_COMMUNITY): Payer: Medicare Other

## 2021-08-25 ENCOUNTER — Other Ambulatory Visit: Payer: Self-pay

## 2021-08-25 DIAGNOSIS — I10 Essential (primary) hypertension: Secondary | ICD-10-CM | POA: Diagnosis present

## 2021-08-25 DIAGNOSIS — K222 Esophageal obstruction: Secondary | ICD-10-CM | POA: Diagnosis not present

## 2021-08-25 DIAGNOSIS — Z888 Allergy status to other drugs, medicaments and biological substances status: Secondary | ICD-10-CM | POA: Diagnosis not present

## 2021-08-25 DIAGNOSIS — R0789 Other chest pain: Secondary | ICD-10-CM | POA: Diagnosis present

## 2021-08-25 DIAGNOSIS — Z825 Family history of asthma and other chronic lower respiratory diseases: Secondary | ICD-10-CM | POA: Diagnosis not present

## 2021-08-25 DIAGNOSIS — K449 Diaphragmatic hernia without obstruction or gangrene: Secondary | ICD-10-CM | POA: Diagnosis present

## 2021-08-25 DIAGNOSIS — G894 Chronic pain syndrome: Secondary | ICD-10-CM | POA: Diagnosis present

## 2021-08-25 DIAGNOSIS — R1310 Dysphagia, unspecified: Secondary | ICD-10-CM | POA: Diagnosis present

## 2021-08-25 DIAGNOSIS — G4733 Obstructive sleep apnea (adult) (pediatric): Secondary | ICD-10-CM | POA: Diagnosis present

## 2021-08-25 DIAGNOSIS — Z8249 Family history of ischemic heart disease and other diseases of the circulatory system: Secondary | ICD-10-CM | POA: Diagnosis not present

## 2021-08-25 DIAGNOSIS — I4892 Unspecified atrial flutter: Secondary | ICD-10-CM | POA: Diagnosis present

## 2021-08-25 DIAGNOSIS — R079 Chest pain, unspecified: Secondary | ICD-10-CM | POA: Diagnosis not present

## 2021-08-25 DIAGNOSIS — I509 Heart failure, unspecified: Secondary | ICD-10-CM | POA: Diagnosis not present

## 2021-08-25 DIAGNOSIS — R933 Abnormal findings on diagnostic imaging of other parts of digestive tract: Secondary | ICD-10-CM | POA: Diagnosis not present

## 2021-08-25 DIAGNOSIS — M797 Fibromyalgia: Secondary | ICD-10-CM | POA: Diagnosis present

## 2021-08-25 DIAGNOSIS — I484 Atypical atrial flutter: Secondary | ICD-10-CM | POA: Diagnosis not present

## 2021-08-25 DIAGNOSIS — Z79899 Other long term (current) drug therapy: Secondary | ICD-10-CM | POA: Diagnosis not present

## 2021-08-25 DIAGNOSIS — E785 Hyperlipidemia, unspecified: Secondary | ICD-10-CM | POA: Diagnosis present

## 2021-08-25 DIAGNOSIS — Z7901 Long term (current) use of anticoagulants: Secondary | ICD-10-CM | POA: Diagnosis not present

## 2021-08-25 DIAGNOSIS — I160 Hypertensive urgency: Secondary | ICD-10-CM | POA: Diagnosis present

## 2021-08-25 DIAGNOSIS — Z91048 Other nonmedicinal substance allergy status: Secondary | ICD-10-CM | POA: Diagnosis not present

## 2021-08-25 DIAGNOSIS — I110 Hypertensive heart disease with heart failure: Secondary | ICD-10-CM | POA: Diagnosis not present

## 2021-08-25 DIAGNOSIS — I2510 Atherosclerotic heart disease of native coronary artery without angina pectoris: Secondary | ICD-10-CM | POA: Diagnosis present

## 2021-08-25 DIAGNOSIS — K259 Gastric ulcer, unspecified as acute or chronic, without hemorrhage or perforation: Secondary | ICD-10-CM | POA: Diagnosis not present

## 2021-08-25 DIAGNOSIS — I200 Unstable angina: Secondary | ICD-10-CM | POA: Diagnosis not present

## 2021-08-25 DIAGNOSIS — Z885 Allergy status to narcotic agent status: Secondary | ICD-10-CM | POA: Diagnosis not present

## 2021-08-25 DIAGNOSIS — I25119 Atherosclerotic heart disease of native coronary artery with unspecified angina pectoris: Secondary | ICD-10-CM | POA: Diagnosis not present

## 2021-08-25 DIAGNOSIS — Z823 Family history of stroke: Secondary | ICD-10-CM | POA: Diagnosis not present

## 2021-08-25 DIAGNOSIS — K219 Gastro-esophageal reflux disease without esophagitis: Secondary | ICD-10-CM | POA: Diagnosis present

## 2021-08-25 DIAGNOSIS — K317 Polyp of stomach and duodenum: Secondary | ICD-10-CM | POA: Diagnosis present

## 2021-08-25 LAB — ECHOCARDIOGRAM COMPLETE
AR max vel: 1.61 cm2
AV Area VTI: 1.66 cm2
AV Area mean vel: 1.55 cm2
AV Mean grad: 23.8 mmHg
AV Peak grad: 43.5 mmHg
Ao pk vel: 3.3 m/s
Area-P 1/2: 2.43 cm2
Height: 64 in
S' Lateral: 2.3 cm
Weight: 3054.69 oz

## 2021-08-25 LAB — TROPONIN I (HIGH SENSITIVITY)
Troponin I (High Sensitivity): 13 ng/L (ref <18)
Troponin I (High Sensitivity): 13 ng/L (ref <18)
Troponin I (High Sensitivity): 11 ng/L (ref <18)

## 2021-08-25 MED ORDER — METOPROLOL TARTRATE 25 MG PO TABS
25.0000 mg | ORAL_TABLET | Freq: Two times a day (BID) | ORAL | Status: DC
  Administered 2021-08-25 – 2021-08-28 (×5): 25 mg via ORAL
  Filled 2021-08-25 (×5): qty 1

## 2021-08-25 MED ORDER — HEPARIN (PORCINE) 25000 UT/250ML-% IV SOLN
1200.0000 [IU]/h | INTRAVENOUS | Status: DC
Start: 2021-08-25 — End: 2021-08-27
  Administered 2021-08-25: 1100 [IU]/h via INTRAVENOUS
  Administered 2021-08-26: 1200 [IU]/h via INTRAVENOUS
  Filled 2021-08-25 (×2): qty 250

## 2021-08-25 MED ORDER — METOPROLOL TARTRATE 25 MG PO TABS
25.0000 mg | ORAL_TABLET | Freq: Two times a day (BID) | ORAL | Status: DC
Start: 2021-08-25 — End: 2021-08-25

## 2021-08-25 MED ORDER — ALUM & MAG HYDROXIDE-SIMETH 200-200-20 MG/5ML PO SUSP
30.0000 mL | ORAL | Status: AC | PRN
  Administered 2021-08-25 – 2021-08-27 (×2): 30 mL via ORAL
  Filled 2021-08-25 (×3): qty 30

## 2021-08-25 MED ORDER — SUCRALFATE 1 G PO TABS
1.0000 g | ORAL_TABLET | Freq: Every day | ORAL | Status: DC | PRN
  Administered 2021-08-25: 1 g via ORAL
  Filled 2021-08-25 (×2): qty 1

## 2021-08-25 MED ORDER — SPIRONOLACTONE 25 MG PO TABS
25.0000 mg | ORAL_TABLET | Freq: Every day | ORAL | Status: DC
Start: 2021-08-25 — End: 2021-08-25

## 2021-08-25 MED ORDER — ACETAMINOPHEN 325 MG PO TABS
650.0000 mg | ORAL_TABLET | Freq: Four times a day (QID) | ORAL | Status: DC | PRN
Start: 2021-08-25 — End: 2021-08-28

## 2021-08-25 MED ORDER — ISOSORBIDE DINITRATE 10 MG PO TABS
10.0000 mg | ORAL_TABLET | Freq: Three times a day (TID) | ORAL | Status: DC
  Administered 2021-08-25 – 2021-08-28 (×8): 10 mg via ORAL
  Filled 2021-08-25 (×8): qty 1

## 2021-08-25 MED ORDER — LORAZEPAM 0.5 MG PO TABS
0.5000 mg | ORAL_TABLET | Freq: Once | ORAL | Status: AC | PRN
  Administered 2021-08-25: 0.5 mg via ORAL
  Filled 2021-08-25: qty 1

## 2021-08-25 MED ORDER — HYDRALAZINE HCL 50 MG PO TABS
50.0000 mg | ORAL_TABLET | Freq: Three times a day (TID) | ORAL | Status: DC
  Administered 2021-08-25 – 2021-08-28 (×9): 50 mg via ORAL
  Filled 2021-08-25 (×9): qty 1

## 2021-08-25 MED ORDER — LOSARTAN POTASSIUM 50 MG PO TABS
50.0000 mg | ORAL_TABLET | Freq: Every day | ORAL | Status: DC
  Administered 2021-08-25 – 2021-08-28 (×4): 50 mg via ORAL
  Filled 2021-08-25 (×4): qty 1

## 2021-08-25 MED ORDER — ACETAMINOPHEN 650 MG RE SUPP: 650.0000 mg | Freq: Four times a day (QID) | RECTAL | Status: DC | PRN

## 2021-08-25 MED ORDER — SPIRONOLACTONE 25 MG PO TABS
25.0000 mg | ORAL_TABLET | Freq: Every day | ORAL | Status: DC
  Administered 2021-08-26 – 2021-08-28 (×3): 25 mg via ORAL
  Filled 2021-08-25 (×3): qty 1

## 2021-08-25 MED ORDER — SUCRALFATE 1 G PO TABS: 1.0000 g | ORAL_TABLET | Freq: Every day | ORAL | Status: DC

## 2021-08-25 MED ORDER — LOSARTAN POTASSIUM 50 MG PO TABS: 50.0000 mg | ORAL_TABLET | Freq: Every day | ORAL | Status: DC

## 2021-08-25 MED ORDER — PANTOPRAZOLE SODIUM 40 MG IV SOLR
40.0000 mg | Freq: Two times a day (BID) | INTRAVENOUS | Status: DC
  Administered 2021-08-25 – 2021-08-26 (×3): 40 mg via INTRAVENOUS
  Filled 2021-08-25 (×3): qty 10

## 2021-08-25 MED ORDER — HYDROCODONE-ACETAMINOPHEN 7.5-325 MG PO TABS
1.0000 | ORAL_TABLET | Freq: Three times a day (TID) | ORAL | Status: DC | PRN
  Administered 2021-08-25 – 2021-08-28 (×7): 1 via ORAL
  Filled 2021-08-25 (×7): qty 1

## 2021-08-25 MED ORDER — GABAPENTIN 300 MG PO CAPS
300.0000 mg | ORAL_CAPSULE | Freq: Every day | ORAL | Status: DC
Start: 2021-08-25 — End: 2021-08-28
  Administered 2021-08-25 – 2021-08-27 (×4): 300 mg via ORAL
  Filled 2021-08-25 (×4): qty 1

## 2021-08-25 MED ORDER — ISOSORBIDE DINITRATE 10 MG PO TABS: 10.0000 mg | ORAL_TABLET | Freq: Three times a day (TID) | ORAL | Status: DC

## 2021-08-25 MED ORDER — NITROGLYCERIN IN D5W 200-5 MCG/ML-% IV SOLN
0.0000 ug/min | INTRAVENOUS | Status: DC
  Administered 2021-08-25: 5 ug/min via INTRAVENOUS
  Administered 2021-08-25: 50 ug/min via INTRAVENOUS
  Filled 2021-08-25: qty 250

## 2021-08-25 MED ORDER — APIXABAN 5 MG PO TABS
5.0000 mg | ORAL_TABLET | Freq: Two times a day (BID) | ORAL | Status: DC
  Administered 2021-08-25: 5 mg via ORAL
  Filled 2021-08-25: qty 1

## 2021-08-25 MED ORDER — HYDRALAZINE HCL 50 MG PO TABS: 50.0000 mg | ORAL_TABLET | Freq: Three times a day (TID) | ORAL | Status: DC

## 2021-08-25 MED ORDER — SUCRALFATE 1 G PO TABS
1.0000 g | ORAL_TABLET | Freq: Three times a day (TID) | ORAL | Status: DC
  Administered 2021-08-25 – 2021-08-28 (×10): 1 g via ORAL
  Filled 2021-08-25 (×14): qty 1

## 2021-08-25 MED ORDER — SUCRALFATE 1 G PO TABS
1.0000 g | ORAL_TABLET | Freq: Every day | ORAL | Status: DC
  Filled 2021-08-25: qty 1

## 2021-08-25 NOTE — Progress Notes (Signed)
ANTICOAGULATION CONSULT NOTE - Initial Consult  Pharmacy Consult for Heparin (Apixaban on hold) Indication: atrial fibrillation  Allergies  Allergen Reactions   Moxifloxacin Hives, Shortness Of Breath, Swelling and Rash    Rash was severe, caused tremors, ans skin became BLOODY RED   Quinolones Anaphylaxis   Sulfa Antibiotics Hives and Rash   Furosemide Other (See Comments)    CANNOT TAKE DUE TO Interstitial cystitis   Aspirin Nausea Only and Other (See Comments)    GI and stomach upset   Doxazosin     constipation   Duloxetine Hcl Nausea Only and Other (See Comments)    Vertigo and heart racing also    Hydrochlorothiazide Other (See Comments)    Headaches    Ibuprofen Nausea Only and Other (See Comments)    GI upset   Nebivolol Hcl     Other reaction(s): extreme fatigue   Other Other (See Comments)   Ranexa [Ranolazine]     Constipation/heart pounding   Robaxin [Methocarbamol] Other (See Comments)    "Make me feel flu-like symptoms"   Topiramate Itching   Xarelto [Rivaroxaban]     Back pain   Zofran [Ondansetron Hcl] Other (See Comments)    Headaches    Codeine Hives, Nausea And Vomiting and Rash   Dimethyl Sulfoxide Hives and Itching   Macrodantin [Nitrofurantoin Macrocrystal] Itching, Nausea And Vomiting and Rash    Abdominal and chest pain, also- blood-red skin, also   Nitrofurantoin Diarrhea, Itching, Rash and Other (See Comments)    GI/stomach upset, sweating, chest pain, and skin turned bloody red   Tape Rash   Yellow Dye Rash    Patient Measurements: Height: 5\' 4"  (162.6 cm) Weight: 86.6 kg (190 lb 14.7 oz) IBW/kg (Calculated) : 54.7 Heparin Dosing Weight: 74 kg  Vital Signs: Temp: 98.3 F (36.8 C) (05/26 1200) Temp Source: Oral (05/26 1200) BP: 149/73 (05/26 1344) Pulse Rate: 75 (05/26 1200)  Labs: Recent Labs    08/24/21 2129 08/24/21 2310 08/25/21 0247 08/25/21 0606  HGB 10.0*  --   --   --   HCT 32.2*  --   --   --   PLT 280  --   --    --   CREATININE 0.92  --   --   --   TROPONINIHS 13 13 13 11     Estimated Creatinine Clearance: 58 mL/min (by C-G formula based on SCr of 0.92 mg/dL).   Medical History: Past Medical History:  Diagnosis Date   Acute meniscal tear of left knee    FOLLOWED BY DR GIAFFREY   Atrial flutter (HCC)    AV block, Mobitz 1    BPPV (benign paroxysmal positional vertigo)    Chronic bladder pain    Chronic fatigue syndrome    Chronic low back pain    W/ RIGHT LEG PAIN AND NUMBNESS   Cystitis, chronic    Fibromyalgia    GERD (gastroesophageal reflux disease)    Hiatal hernia    Hypertension    IC (interstitial cystitis)    OSA (obstructive sleep apnea)    per pt study yrs ago-- moderate osa ,  intolerant cpap   Pinched vertebral nerve    bilateral L2 -- L3 and L3 -- L4-/  epidural injection's treatment , PT and pain clince   PONV (postoperative nausea and vomiting)    severe   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 Last dose of Apixaban given this morning at 0937.  Admitted 5/25 pm with chest pain, noted somewhat atypical.   Will monitor heparin with aPTTs, while heparin levels are expected to be falsely high due to recent Apixaban doses.  Goal of Therapy:  Heparin level 0.3-0.7 units/ml aPTT 66-102 seconds Monitor platelets by anticoagulation protocol: Yes   Plan:  Begin heparin drip 1100 units/hr at ~9:30pm aPTT and heparin level ~8 hrs after drip begins, then daily. Daily CBC. Apixban on hold. Will follow up plans.  Arty Baumgartner, RPh 08/25/2021,2:48 PM

## 2021-08-25 NOTE — H&P (Signed)
History and Physical    Jennifer [name]:[id number] DOB: September 06, 1945 DOA: 08/24/2021  PCP: Jarrett Soho, PA-C  Patient coming from: Home  Chief Complaint: Chest pain  HPI: Jennifer [name] is a 76 y.o. female with medical history significant of CAD, atrial flutter on Eliquis, hypertension, hyperlipidemia, chronic pain syndrome, GERD, OSA intolerant of CPAP presented to the ED complaining of chest pain for several days.  Blood pressure elevated with systolic in the 190s with EMS and was given nitroglycerin x2 prior to arrival.  On arrival to the ED, blood pressure improved with systolic in the 150s.  Labs showing WBC 7.0, hemoglobin 10.0 (no significant change from baseline), platelet count 280k.  Sodium 140, potassium 3.8, chloride 109, bicarb 23, BUN 12, creatinine 0.9, glucose 106.  High-sensitivity troponin negative x2.  Chest x-ray negative for acute finding.  Cardiology consulted.  Patient reports 1 week history of episodes of left-sided and substernal chest pain/pressure both with exertion and at rest.  She has been taking nitroglycerin at home and it seems to help.  Also having shortness of breath this past week.  Reports history of cardiac catheterization a year ago and was told a stent could not be placed due to tortuous arteries.  Due to ongoing symptoms she spoke to her cardiologist's office and was advised to come into the ED to be evaluated.  States she took 1 nitroglycerin at home prior to EMS arrival and was given another 1 by EMS after which her chest pain subsided but while in the emergency room she had additional episodes of chest pain and still continues to feel some chest pressure.  Patient states she was told her blood pressure was very high with EMS but she is taking all of her home blood pressure medications regularly and has not missed any doses.  Review of Systems:  Review of Systems  All other systems reviewed and are negative.  Past Medical History:  Diagnosis  Date   Acute meniscal tear of left knee    FOLLOWED BY DR GIAFFREY   Atrial flutter (HCC)    AV block, Mobitz 1    BPPV (benign paroxysmal positional vertigo)    Chronic bladder pain    Chronic fatigue syndrome    Chronic low back pain    W/ RIGHT LEG PAIN AND NUMBNESS   Cystitis, chronic    Fibromyalgia    GERD (gastroesophageal reflux disease)    Hiatal hernia    Hypertension    IC (interstitial cystitis)    OSA (obstructive sleep apnea)    per pt study yrs ago-- moderate osa ,  intolerant cpap   Pinched vertebral nerve    bilateral L2 -- L3 and L3 -- L4-/  epidural injection's treatment , PT and pain clince   PONV (postoperative nausea and vomiting)    severe   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    Self-catheterizes urinary bladder    Spinal stenosis, lumbar region with neurogenic claudication    Wears glasses    Wears partial dentures    upper and lower    Past Surgical History:  Procedure Laterality Date   ABDOMINAL AORTOGRAM N/A 09/16/2020   Procedure: ABDOMINAL AORTOGRAM;  Surgeon: Iran Ouch, MD;  Location: [facility];  Service: Cardiovascular;  Laterality: N/A;   ABDOMINAL HYSTERECTOMY  1980   w/ Left Salpingoophorectomy   CARDIAC CATHETERIZATION  08-21-2001  dr Verdis Prime   normal coronary arteries  and LVF   CARPAL TUNNEL RELEASE Bilateral 1986   CECALCYSTOPLASTY/  APPENDECTOMY  1984   at [facility]   CHOLECYSTECTOMY  1992   CYSTO WITH HYDRODISTENSION N/A 01/12/2016   Procedure: CYSTOSCOPY/HYDRODISTENSION;  Surgeon: Jethro Bolus, MD;  Location: [facility];  Service: Urology;  Laterality: N/A;   CYSTO WITH HYDRODISTENSION N/A 08/30/2016   Procedure: CYSTOSCOPY/HYDRODISTENSION OF BLADDER INJECTION OF MARCAINE/PYRIDIUM;  Surgeon: Jethro Bolus, MD;  Location: [facility];  Service: Urology;  Laterality: N/A;   LEFT HEART CATH AND CORONARY ANGIOGRAPHY N/A 09/16/2020    Procedure: LEFT HEART CATH AND CORONARY ANGIOGRAPHY;  Surgeon: Iran Ouch, MD;  Location: [facility];  Service: Cardiovascular;  Laterality: N/A;   MULTIPLE CYSTO/  HYDRODISTENTION/  INSTILLATION THERAPY  last one 08-19-2009   NASAL SINUS SURGERY  1987   TONSILLECTOMY  as child   VAGINAL GROWTH REMOVED  as teen     reports that she has never smoked. She has never used smokeless tobacco. She reports that she does not drink alcohol and does not use drugs.  Allergies  Allergen Reactions   Moxifloxacin Hives, Shortness Of Breath, Swelling and Rash    Rash was severe, caused tremors, ans skin became BLOODY RED   Quinolones Anaphylaxis   Sulfa Antibiotics Hives and Rash   Furosemide Other (See Comments)    CANNOT TAKE DUE TO Interstitial cystitis   Aspirin Nausea Only and Other (See Comments)    GI and stomach upset   Doxazosin     constipation   Duloxetine Hcl Nausea Only and Other (See Comments)    Vertigo and heart racing also    Hydrochlorothiazide Other (See Comments)    Headaches    Ibuprofen Nausea Only and Other (See Comments)    GI upset   Nebivolol Hcl     Other reaction(s): extreme fatigue   Other Other (See Comments)   Ranexa [Ranolazine]     Constipation/heart pounding   Robaxin [Methocarbamol] Other (See Comments)    "Make me feel flu-like symptoms"   Topiramate Itching   Xarelto [Rivaroxaban]     Back pain   Zofran [Ondansetron Hcl] Other (See Comments)    Headaches    Codeine Hives, Nausea And Vomiting and Rash   Dimethyl Sulfoxide Hives and Itching   Macrodantin [Nitrofurantoin Macrocrystal] Itching, Nausea And Vomiting and Rash    Abdominal and chest pain, also- blood-red skin, also   Nitrofurantoin Diarrhea, Itching, Rash and Other (See Comments)    GI/stomach upset, sweating, chest pain, and skin turned bloody red   Tape Rash   Yellow Dye Rash    Family History  Problem Relation Age of Onset   CAD Father    Asthma Father     Hypertension Father    Alzheimer's disease Father    Emphysema Father    COPD Father    Heart failure Mother    Peripheral vascular disease Mother    Stroke Mother    Hypertension Mother    Colonic polyp Mother    Cancer Brother        melanoma    Prior to Admission medications   Medication Sig Start Date End Date Taking? Authorizing Provider  acetaminophen (TYLENOL) 500 MG tablet Take 500 mg by mouth 2 (two) times daily as needed (for pain).   Yes [provider]  apixaban (ELIQUIS) 5 MG TABS tablet Take 1 tablet (5 mg total) by mouth 2 (two) times daily. 02/01/21  Yes [name],  Dayna N, PA-C  dextromethorphan-guaiFENesin (MUCINEX DM) 30-600 MG 12hr tablet Take 1 tablet by mouth every 12 (twelve) hours as needed for cough.   Yes [provider]  fexofenadine (ALLEGRA) 60 MG tablet Take 60 mg by mouth daily as needed for allergies. 01/06/14  Yes [provider]  gabapentin (NEURONTIN) 300 MG capsule Take 300 mg by mouth at bedtime. 07/28/21  Yes [provider]  hydrALAZINE (APRESOLINE) 50 MG tablet Take 1 tablet (50 mg total) by mouth 3 (three) times daily. 12/09/20  Yes Chandrasekhar, Mahesh A, MD  HYDROcodone-acetaminophen (NORCO) 7.5-325 MG tablet Take 1 tablet by mouth every 8 (eight) hours as needed for moderate pain or severe pain. 12/22/20  Yes [provider]  hydrocortisone (ANUSOL-HC) 25 MG suppository Place 25 mg rectally 2 (two) times daily as needed for hemorrhoids. 12/07/20  Yes [provider]  isosorbide dinitrate (ISORDIL) 10 MG tablet TAKE 1 TABLET BY MOUTH THREE TIMES A DAY Patient taking differently: Take 10 mg by mouth 3 (three) times daily. 12/19/20  Yes Chandrasekhar, Mahesh A, MD  losartan (COZAAR) 50 MG tablet Take 1 tablet (50 mg total) by mouth daily. 02/01/21 08/24/21 Yes [name], Tacey Ruiz, PA-C  meclizine (ANTIVERT) 25 MG tablet Take 25 mg by mouth 3 (three) times daily as needed for dizziness.   Yes [provider]   Methen-Hyosc-Meth Blue-Na Phos (UROGESIC-BLUE) 81.6 MG TABS Take 1 tablet by mouth every 6 (six) hours as needed (urinary burning). 08/21/21  Yes [provider]  metoprolol tartrate (LOPRESSOR) 25 MG tablet Take 1 tablet (25 mg total) by mouth 2 (two) times daily. 02/10/21  Yes Chandrasekhar, Mahesh A, MD  nitroGLYCERIN (NITROSTAT) 0.4 MG SL tablet Place 1 tablet (0.4 mg total) under the tongue every 5 (five) minutes as needed for chest pain. Take up to 3 tablets 01/18/21  Yes Chandrasekhar, Mahesh A, MD  nystatin cream (MYCOSTATIN) Apply topically 2 (two) times daily. Patient taking differently: Apply 1 application. topically 2 (two) times daily as needed for dry skin. 08/04/20  Yes Ginnie Smart, MD  omeprazole (PRILOSEC) 20 MG capsule Take 20 mg by mouth See admin instructions. Take 20 mg by mouth in the morning one hour before breakfast and an additional 20 mg before evening meal as needed for GERD 02/08/14  Yes [provider]  ondansetron (ZOFRAN-ODT) 4 MG disintegrating tablet Take 4 mg by mouth every 8 (eight) hours as needed for nausea (dissolve orally). 07/01/17  Yes [provider]  polyethylene glycol powder (GLYCOLAX/MIRALAX) 17 GM/SCOOP powder Take 17 g by mouth daily as needed for mild constipation.   Yes [provider]  promethazine (PHENERGAN) 25 MG tablet Take 25 mg by mouth every 6 (six) hours as needed for vomiting or nausea. 10/20/20  Yes [provider]  spironolactone (ALDACTONE) 25 MG tablet Take 1 tablet (25 mg total) by mouth daily. 11/03/20  Yes Chandrasekhar, Mahesh A, MD  sucralfate (CARAFATE) 1 G tablet Take 1 g by mouth See admin instructions. Take 1 gram by mouth in the morning and an additional 1 gram once a day for flares   Yes [provider]  triamcinolone cream (KENALOG) 0.1 % Apply 1 application. topically 2 (two) times daily as needed (rash). 04/06/21  Yes [provider]  ezetimibe (ZETIA) 10 MG tablet  Take 1 tablet (10 mg total) by mouth daily. Patient not taking: Reported on 06/08/2021 05/11/21   Christell Constant, MD  fosfomycin (MONUROL) 3 g PACK SMARTSIG:1 By Mouth  Every 3 Days Patient not taking: Reported on 08/24/2021 08/01/21   [provider]  Meth-Hyo-M Bl-Benz Acd-Ph Sal 81.6 MG TABS Take 1 tablet (81.6 mg total) by mouth 2 (two) times daily as needed. Patient not taking: Reported on 08/24/2021 06/07/17   Ginnie SmartHatcher, Jeffrey C, MD    Physical Exam: Vitals:   08/24/21 2245 08/24/21 2315 08/24/21 2330 08/24/21 2345  BP: (!) 169/72 (!) 169/69 (!) 153/78 (!) 160/78  Pulse: 65  69   Resp: 13 16 14 17   Temp:      TempSrc:      SpO2: 96% 96% 92% 95%  Weight:      Height:        Physical Exam Vitals reviewed.  Constitutional:      General: She is not in acute distress. HENT:     Head: Normocephalic and atraumatic.  Eyes:     Extraocular Movements: Extraocular movements intact.     Conjunctiva/sclera: Conjunctivae normal.  Cardiovascular:     Rate and Rhythm: Normal rate and regular rhythm.     Pulses: Normal pulses.     Heart sounds: Murmur heard.  Pulmonary:     Effort: Pulmonary effort is normal. No respiratory distress.     Breath sounds: Normal breath sounds. No wheezing or rales.  Abdominal:     General: Bowel sounds are normal. There is no distension.     Palpations: Abdomen is soft.     Tenderness: There is no abdominal tenderness.  Musculoskeletal:        General: No swelling or tenderness.     Cervical back: Normal range of motion.  Skin:    General: Skin is warm and dry.  Neurological:     General: No focal deficit present.     Mental Status: She is alert and oriented to person, place, and time.     Labs on Admission: I have personally reviewed following labs and imaging studies  CBC: Recent Labs  Lab 08/24/21 2129  WBC 7.0  NEUTROABS 4.8  HGB 10.0*  HCT 32.2*  MCV 84.7  PLT 280   Basic Metabolic Panel: Recent Labs  Lab 08/24/21 2129   NA 140  K 3.8  CL 109  CO2 23  GLUCOSE 106*  BUN 12  CREATININE 0.92  CALCIUM 9.0   GFR: Estimated Creatinine Clearance: 58 mL/min (by C-G formula based on SCr of 0.92 mg/dL). Liver Function Tests: Recent Labs  Lab 08/24/21 2129  AST 22  ALT 19  ALKPHOS 57  BILITOT 0.8  PROT 6.7  ALBUMIN 3.7   No results for input(s): LIPASE, AMYLASE in the last 168 hours. No results for input(s): AMMONIA in the last 168 hours. Coagulation Profile: No results for input(s): INR, PROTIME in the last 168 hours. Cardiac Enzymes: No results for input(s): CKTOTAL, CKMB, CKMBINDEX, TROPONINI in the last 168 hours. BNP (last 3 results) No results for input(s): PROBNP in the last 8760 hours. HbA1C: No results for input(s): HGBA1C in the last 72 hours. CBG: No results for input(s): GLUCAP in the last 168 hours. Lipid Profile: No results for input(s): CHOL, HDL, LDLCALC, TRIG, CHOLHDL, LDLDIRECT in the last 72 hours. Thyroid Function Tests: No results for input(s): TSH, T4TOTAL, FREET4, T3FREE, THYROIDAB in the last 72 hours. Anemia Panel: No results for input(s): VITAMINB12, FOLATE, FERRITIN, TIBC, IRON, RETICCTPCT in the last 72 hours. Urine analysis:    Component Value Date/Time   COLORURINE YELLOW 01/10/2021 1200   APPEARANCEUR CLOUDY (A) 01/10/2021 1200  LABSPEC 1.016 01/10/2021 1200   PHURINE 7.0 01/10/2021 1200   GLUCOSEU NEGATIVE 01/10/2021 1200   HGBUR NEGATIVE 01/10/2021 1200   BILIRUBINUR NEGATIVE 01/10/2021 1200   KETONESUR NEGATIVE 01/10/2021 1200   PROTEINUR NEGATIVE 01/10/2021 1200   NITRITE POSITIVE (A) 01/10/2021 1200   LEUKOCYTESUR LARGE (A) 01/10/2021 1200    Radiological Exams on Admission: I have personally reviewed images DG Chest Port 1 View  Result Date: 08/24/2021 CLINICAL DATA:  Chest pain EXAM: PORTABLE CHEST 1 VIEW COMPARISON:  01/09/2021 FINDINGS: Lungs are clear.  No pleural effusion or pneumothorax. The heart is normal in size. Mild degenerative  changes of the thoracic spine. IMPRESSION: No evidence of acute cardiopulmonary disease. Electronically Signed   By: Charline Bills M.D.   On: 08/24/2021 21:14    EKG: Independently reviewed.  Sinus rhythm, no acute ischemic changes.  Assessment and Plan  Chest pain/concern for unstable angina CAD High-sensitivity troponin negative x2.  Chest x-ray negative for acute finding.  Although symptoms could possibly be related to hypertensive urgency, she does have known CAD and history concerning for possible unstable angina.  Cardiac cath done in June 2022 showing significant LAD disease but she had very tortuous vessels not amenable to stenting.  Management complicated by multiple drug allergies including aspirin intolerance. PE less likely given no tachycardia or hypoxia.  Chest pain initially subsided after nitroglycerin x2 but she is now having it again and blood pressure is elevated. -Cardiac monitoring -Start IV nitroglycerin -She is chronically anticoagulated with Eliquis, continue at this time.  If troponin elevated on repeat labs, switch to IV heparin.  Discussed with Dr. Hyacinth Meeker, cardiology will consult in the morning. -Serial troponin and EKGs -Echocardiogram  Hypertensive urgency Blood pressure remains elevated with systolic above 180. -Started on IV nitroglycerin given ongoing chest pain  History of atrial flutter Currently in sinus rhythm. -Continue Eliquis  Chronic pain syndrome -Continue home gabapentin and Norco as needed  DVT prophylaxis: Eliquis Code Status: Full Code (discussed with the patient) Family Communication: No family available at this time. Consults called: Cardiology Level of care: Progressive Care Unit Admission status: It is my clinical opinion that referral for OBSERVATION is reasonable and necessary in this patient based on the above information provided. The aforementioned taken together are felt to place the patient at high risk for further clinical  deterioration. However, it is anticipated that the patient may be medically stable for discharge from the hospital within 24 to 48 hours.   John Giovanni MD [facility]  If 7PM-7AM, please contact night-coverage [email or web address]  08/25/2021, 12:34 AM

## 2021-08-25 NOTE — Plan of Care (Signed)
  Problem: Education: Goal: Knowledge of General Education information will improve Description Including pain rating scale, medication(s)/side effects and non-pharmacologic comfort measures Outcome: Progressing   Problem: Health Behavior/Discharge Planning: Goal: Ability to manage health-related needs will improve Outcome: Progressing   

## 2021-08-25 NOTE — Progress Notes (Signed)
Mobility Specialist Progress Note:   08/25/21 1610  Mobility  Activity Ambulated with assistance to bathroom  Level of Assistance Modified independent, requires aide device or extra time  Assistive Device Front wheel walker  Distance Ambulated (ft) 30 ft  Activity Response Tolerated well  $Mobility charge 1 Mobility   Pt ambulated back to bathroom with RW. No complaints of pain. Left in bed with call bell in reach and all needs met.  [facility] Shloima Clinch Mobility Specialist

## 2021-08-25 NOTE — Progress Notes (Addendum)
Patient placed in observation after midnight, please see H&P.  Here with chest pain.  ? GI related.  Had not had EGD in years but does have GERD symptoms and takes acid blocker and Carafate.  Once cardiology has seen will plan to consult GI for ? EGD  Jennifer Canary DO   Addendum -GI consult called: [facility] (saw in the past) -eliquis held

## 2021-08-25 NOTE — TOC Initial Note (Signed)
Transition of Care [facility]) - Initial/Assessment Note    Patient Details  Name: Jennifer [name] MRN: [id number] Date of Birth: [birth year]  Transition of Care [facility]) CM/SW Contact:    Lockie Pares, RN Phone Number: 08/25/2021, 10:02 AM  Clinical Narrative:                  The Transition of Care Department [facility]) has reviewed patient and no TOC needs have been identified at this time. We will continue to monitor patient advancement through interdisciplinary progression rounds. If new patient transition needs arise, please place a TOC consult        Patient Goals and CMS Choice        Expected Discharge Plan and Services                                                Prior Living Arrangements/Services                       Activities of Daily Living Home Assistive Devices/Equipment: Dan Humphreys (specify type) (front wheel) ADL Screening (condition at time of admission) Patient's cognitive ability adequate to safely complete daily activities?: Yes Is the patient deaf or have difficulty hearing?: No Does the patient have difficulty seeing, even when wearing glasses/contacts?: No Does the patient have difficulty concentrating, remembering, or making decisions?: No Patient able to express need for assistance with ADLs?: Yes Does the patient have difficulty dressing or bathing?: No Independently performs ADLs?: Yes (appropriate for developmental age) Does the patient have difficulty walking or climbing stairs?: No Weakness of Legs: None Weakness of Arms/Hands: None  Permission Sought/Granted                  Emotional Assessment              Admission diagnosis:  Unstable angina (HCC) [I20.0] Chest pain [R07.9] Patient Active Problem List   Diagnosis Date Noted   Chronic pain syndrome 08/25/2021   Atrial flutter (HCC) 05/11/2021   Aortic valve disorder 02/13/2021   CAD (coronary artery disease) 02/13/2021   Chronic buttock pain 11/25/2020    Hypertensive crisis 09/16/2020   Unstable angina (HCC)    Ischemic chest pain (HCC) 09/15/2020   Hypertensive emergency 09/15/2020   Hypertensive urgency 09/15/2020   Interstitial cystitis (chronic) without hematuria 09/15/2020   Chronic low back pain 09/15/2020   Chronic diastolic CHF (congestive heart failure) (HCC) 09/15/2020   Heart murmur 09/08/2020   Allergic rhinitis 08/17/2020   Altered bowel function 08/17/2020   Anxiety disorder 08/17/2020   Body mass index (BMI) 35.0-35.9, adult 08/17/2020   Chronic fatigue syndrome 08/17/2020   Colon cancer screening 08/17/2020   Functional diarrhea 08/17/2020   Hypothyroidism 08/17/2020   Intestinal gas excretion 08/17/2020   Localized obesity 08/17/2020   Nausea 08/17/2020   Polypharmacy 08/17/2020   Posttraumatic headache 08/17/2020   Posttraumatic vertigo 08/17/2020   Pure hypercholesterolemia 08/17/2020   Lower abdominal pain 08/17/2020   Scoliosis 08/17/2020   Sleep apnea 08/17/2020   Unspecified abnormal finding in specimens from other organs, systems and tissues 08/17/2020   Vitamin D deficiency 08/17/2020   Back pain 04/28/2020   Candidiasis 10/15/2019   Encounter for orthopedic follow-up care 08/12/2018   Vertigo 02/26/2018   Degenerative arthritis of right knee 02/19/2018   Urinary tract infection, site not specified  07/04/2017   Arthralgia of both knees 01/23/2017   Facet arthritis, degenerative, lumbar spine 01/23/2017   Benign paroxysmal positional vertigo 07/03/2016   IBS (irritable bowel syndrome) 04/19/2015   GERD (gastroesophageal reflux disease) 03/21/2015   Fibromyalgia 03/21/2015   Recurrent bacterial cystitis 03/21/2015   HTN (hypertension) 03/21/2015   Fibrositis 03/21/2015   SI (sacroiliac) pain 12/16/2014   DDD (degenerative disc disease), lumbar 05/11/2014   Facet arthropathy 05/11/2014   Spinal stenosis of lumbar region 05/11/2014   PCP:  Jarrett Soho, PA-C Pharmacy:   [id number] [place],  - [address] [place] [address] Phone: [phone] Fax: [phone]  [facility] [address] [place] [address] Phone: [phone] Fax: [phone]     Social Determinants of Health (SDOH) Interventions    Readmission Risk Interventions     View : No data to display.

## 2021-08-25 NOTE — Consult Note (Addendum)
Cardiology Consultation:   Patient ID: Jennifer [name] MRN: [id number]; DOB: 1945/09/20  Admit date: 08/24/2021 Date of Consult: 08/25/2021  PCP:  Marda Stalker, [place] [facility] Cardiologist:  Werner Lean, MD  Electrophysiologist:  Vickie Epley, MD       Patient Profile:   Jennifer [name] is a 76 y.o. female with a hx of hypertension, hyperlipidemia, chronic pain syndrome, CAD, history of sinus pause on nebivolol (able to tolerate low dose metoprolol), obstructive sleep apnea not on CPAP and multiple drug allergies who is being seen 08/25/2021 for the evaluation of chest pain at the request of Dr. Eliseo Squires.  History of Present Illness:   Jennifer [name] is a 76 year old female with past medical history of hypertension, hyperlipidemia, chronic pain syndrome, CAD, history of sinus pause on nebivolol (able to tolerate low dose metoprolol), obstructive sleep apnea not on CPAP and multiple drug allergies.  She currently has 22 drug allergies listed.  Coronary CT performed on 09/15/2020 demonstrated coronary calcium 157 which placed the patient in the 74th percentile for age and sex matched control, 51 to 49% disease in proximal LAD, 50 to 70% in mid LAD, 50 to 70% lesion in distal LAD, 1 to 24% lesion in ramus intermedius.  FFR was positive at 0.67 in mid to distal LAD.  Patient subsequently underwent cardiac catheterization on 09/16/2020 which showed single-vessel CAD with 50% mid LAD, 60% D3, 90% distal LAD followed by 40% second distal LAD lesion.  The LAD was very tortuous and the third diagonal lesion was ostial, PCI would carry high risk for dissection as well as sidebranch closure especially in the setting of uncontrolled hypertension.  Therefore medical therapy was recommended.  Patient cannot tolerate aspirin.  Abdominal aortogram was performed due to hypertensive urgency, this did not show any evidence of renal artery stenosis.  Patient was hospitalized in October  2022 for atrial flutter.  She was initially started on Xarelto, however was unable to tolerate Xarelto and switch to Eliquis instead.  She was planned to follow-up with the EP as outpatient for ablation, this was delayed due to significant lumbar stenosis and sciatic pain.  Patient was most recently seen by Dr. Gasper Sells on 05/11/2021 at which time her ranolazine was discontinued due to heart pounding sensation.  Lumbar MRI obtained recently on 08/17/2021 demonstrated moderate to severe spinal canal stenosis at L3-L4 level.  Patient was in her usual state of health until last Thursday, she started having chest burning and pressure sensation along with palpitation.  She says her heart rate shot up to 90 bpm which is quite unusual for, her usual heart rate has been in the 70s.  Symptom lasted all day last Friday before getting better on Saturday.  Symptoms recurred again on Sunday before getting better by Tuesday.  She contacted our office with complaint of heart pounding sensation, fatigue, chest pressure, heaviness and burning.  Blood pressure was normal.  Patient states she felt well on Wednesday.  Yesterday, she went to her doctor's office for visit, on her way home, she had recurrent chest pain, burning sensation and palpitation.  This prompted her to seek medical attention at [facility].  The chest pain lasted for several hours.  Even during today's interview, she has 7 out of 10 chest pain.  For the past 2 weeks, she has been having some burning and a feeling of food particles getting stuck after swallowing solid food.  Her chest discomfort is also worse when  she lays down or with palpation.  Serial troponin negative x4.  Renal function is normal.  Hemoglobin 10.0.  Chest x-ray no acute cardiopulmonary disease.  Echocardiogram obtained today showed EF 65 to 70%, mild LVH, grade 1 DD, trivial MR, moderate aortic stenosis.  EKG is not significantly changed when compared to the previous EKG in  February.  Blood pressure is initially high in the 180s on arrival, however came down with IV nitroglycerin.  Cardiology service consulted for chest pain.  Internal medicine note suggested possible consult to GI after cardiology evaluation.  Patient was previously followed by Dr. Cristina Gong of [facility] prior to his retirement.   Past Medical History:  Diagnosis Date   Acute meniscal tear of left knee    FOLLOWED BY DR GIAFFREY   Atrial flutter (HCC)    AV block, Mobitz 1    BPPV (benign paroxysmal positional vertigo)    Chronic bladder pain    Chronic fatigue syndrome    Chronic low back pain    W/ RIGHT LEG PAIN AND NUMBNESS   Cystitis, chronic    Fibromyalgia    GERD (gastroesophageal reflux disease)    Hiatal hernia    Hypertension    IC (interstitial cystitis)    OSA (obstructive sleep apnea)    per pt study yrs ago-- moderate osa ,  intolerant cpap   Pinched vertebral nerve    bilateral L2 -- L3 and L3 -- L4-/  epidural injection's treatment , PT and pain clince   PONV (postoperative nausea and vomiting)    severe   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    Self-catheterizes urinary bladder    Spinal stenosis, lumbar region with neurogenic claudication    Wears glasses    Wears partial dentures    upper and lower    Past Surgical History:  Procedure Laterality Date   ABDOMINAL AORTOGRAM N/A 09/16/2020   Procedure: ABDOMINAL AORTOGRAM;  Surgeon: Wellington Hampshire, MD;  Location: [facility];  Service: Cardiovascular;  Laterality: N/A;   ABDOMINAL HYSTERECTOMY  1980   w/ Left Salpingoophorectomy   CARDIAC CATHETERIZATION  08-21-2001  dr Daneen Schick   normal coronary arteries and LVF   CARPAL TUNNEL RELEASE Bilateral 1986   CECALCYSTOPLASTY/  APPENDECTOMY  1984   at [facility]   [place] WITH HYDRODISTENSION N/A 01/12/2016   Procedure: CYSTOSCOPY/HYDRODISTENSION;  Surgeon: Carolan Clines, MD;  Location:  [facility];  Service: Urology;  Laterality: N/A;   CYSTO WITH HYDRODISTENSION N/A 08/30/2016   Procedure: CYSTOSCOPY/HYDRODISTENSION OF BLADDER INJECTION OF MARCAINE/PYRIDIUM;  Surgeon: Carolan Clines, MD;  Location: [facility];  Service: Urology;  Laterality: N/A;   LEFT HEART CATH AND CORONARY ANGIOGRAPHY N/A 09/16/2020   Procedure: LEFT HEART CATH AND CORONARY ANGIOGRAPHY;  Surgeon: Wellington Hampshire, MD;  Location: [facility];  Service: Cardiovascular;  Laterality: N/A;   MULTIPLE CYSTO/  HYDRODISTENTION/  INSTILLATION THERAPY  last one 08-19-2009   NASAL SINUS SURGERY  1987   TONSILLECTOMY  as child   VAGINAL GROWTH REMOVED  as teen     Home Medications:  Prior to Admission medications   Medication Sig Start Date End Date Taking? Authorizing Provider  acetaminophen (TYLENOL) 500 MG tablet Take 500 mg by mouth 2 (two) times daily as needed (for pain).   Yes [provider]  apixaban (ELIQUIS) 5 MG TABS tablet Take 1 tablet (5  mg total) by mouth 2 (two) times daily. 02/01/21  Yes [name], Dayna N, PA-C  dextromethorphan-guaiFENesin (MUCINEX DM) 30-600 MG 12hr tablet Take 1 tablet by mouth every 12 (twelve) hours as needed for cough.   Yes [provider]  fexofenadine (ALLEGRA) 60 MG tablet Take 60 mg by mouth daily as needed for allergies. 01/06/14  Yes [provider]  gabapentin (NEURONTIN) 300 MG capsule Take 300 mg by mouth at bedtime. 07/28/21  Yes [provider]  hydrALAZINE (APRESOLINE) 50 MG tablet Take 1 tablet (50 mg total) by mouth 3 (three) times daily. 12/09/20  Yes Chandrasekhar, Mahesh A, MD  HYDROcodone-acetaminophen (NORCO) 7.5-325 MG tablet Take 1 tablet by mouth every 8 (eight) hours as needed for moderate pain or severe pain. 12/22/20  Yes [provider]  hydrocortisone (ANUSOL-HC) 25 MG suppository Place 25 mg rectally 2 (two) times daily as needed for hemorrhoids. 12/07/20  Yes [provider]  isosorbide dinitrate (ISORDIL) 10 MG tablet TAKE 1 TABLET BY MOUTH THREE TIMES A DAY Patient taking differently: Take 10 mg by mouth 3 (three) times daily. 12/19/20  Yes Chandrasekhar, Mahesh A, MD  losartan (COZAAR) 50 MG tablet Take 1 tablet (50 mg total) by mouth daily. 02/01/21 08/24/21 Yes [name], Tacey Ruiz, PA-C  meclizine (ANTIVERT) 25 MG tablet Take 25 mg by mouth 3 (three) times daily as needed for dizziness.   Yes [provider]  Methen-Hyosc-Meth Blue-Na Phos (UROGESIC-BLUE) 81.6 MG TABS Take 1 tablet by mouth every 6 (six) hours as needed (urinary burning). 08/21/21  Yes [provider]  metoprolol tartrate (LOPRESSOR) 25 MG tablet Take 1 tablet (25 mg total) by mouth 2 (two) times daily. 02/10/21  Yes Chandrasekhar, Mahesh A, MD  nitroGLYCERIN (NITROSTAT) 0.4 MG SL tablet Place 1 tablet (0.4 mg total) under the tongue every 5 (five) minutes as needed for chest pain. Take up to 3 tablets 01/18/21  Yes Chandrasekhar, Mahesh A, MD  nystatin cream (MYCOSTATIN) Apply topically 2 (two) times daily. Patient taking differently: Apply 1 application. topically 2 (two) times daily as needed for dry skin. 08/04/20  Yes Ginnie Smart, MD  omeprazole (PRILOSEC) 20 MG capsule Take 20 mg by mouth See admin instructions. Take 20 mg by mouth in the morning one hour before breakfast and an additional 20 mg before evening meal as needed for GERD 02/08/14  Yes [provider]  ondansetron (ZOFRAN-ODT) 4 MG disintegrating tablet Take 4 mg by mouth every 8 (eight) hours as needed for nausea (dissolve orally). 07/01/17  Yes [provider]  polyethylene glycol powder (GLYCOLAX/MIRALAX) 17 GM/SCOOP powder Take 17 g by mouth daily as needed for mild constipation.   Yes [provider]  promethazine (PHENERGAN) 25 MG tablet Take 25 mg by mouth every 6 (six) hours as needed for vomiting or nausea. 10/20/20  Yes [provider]  spironolactone (ALDACTONE)  25 MG tablet Take 1 tablet (25 mg total) by mouth daily. 11/03/20  Yes Chandrasekhar, Mahesh A, MD  sucralfate (CARAFATE) 1 G tablet Take 1 g by mouth See admin instructions. Take 1 gram by mouth in the morning and an additional 1 gram once a day for flares   Yes [provider]  triamcinolone cream (KENALOG) 0.1 % Apply 1 application. topically 2 (two) times daily as needed (rash). 04/06/21  Yes [provider]  ezetimibe (ZETIA) 10 MG tablet Take 1 tablet (10 mg total) by mouth daily. Patient not taking: Reported on 06/08/2021 05/11/21   [name],  Mahesh A, MD  fosfomycin (MONUROL) 3 g PACK SMARTSIG:1 By Mouth Every 3 Days Patient not taking: Reported on 08/24/2021 08/01/21   [provider]  Meth-Hyo-M Bl-Benz Acd-Ph Sal 81.6 MG TABS Take 1 tablet (81.6 mg total) by mouth 2 (two) times daily as needed. Patient not taking: Reported on 08/24/2021 06/07/17   Campbell Riches, MD    Inpatient Medications: Scheduled Meds:  apixaban  5 mg Oral BID   gabapentin  300 mg Oral QHS   sucralfate  1 g Oral QAC breakfast   Continuous Infusions:  nitroGLYCERIN 50 mcg/min (08/25/21 1157)   PRN Meds: acetaminophen **OR** acetaminophen, HYDROcodone-acetaminophen, sucralfate  Allergies:    Allergies  Allergen Reactions   Moxifloxacin Hives, Shortness Of Breath, Swelling and Rash    Rash was severe, caused tremors, ans skin became BLOODY RED   Quinolones Anaphylaxis   Sulfa Antibiotics Hives and Rash   Furosemide Other (See Comments)    CANNOT TAKE DUE TO Interstitial cystitis   Aspirin Nausea Only and Other (See Comments)    GI and stomach upset   Doxazosin     constipation   Duloxetine Hcl Nausea Only and Other (See Comments)    Vertigo and heart racing also    Hydrochlorothiazide Other (See Comments)    Headaches    Ibuprofen Nausea Only and Other (See Comments)    GI upset   Nebivolol Hcl     Other reaction(s): extreme fatigue   Other Other (See Comments)    Ranexa [Ranolazine]     Constipation/heart pounding   Robaxin [Methocarbamol] Other (See Comments)    "Make me feel flu-like symptoms"   Topiramate Itching   Xarelto [Rivaroxaban]     Back pain   Zofran [Ondansetron Hcl] Other (See Comments)    Headaches    Codeine Hives, Nausea And Vomiting and Rash   Dimethyl Sulfoxide Hives and Itching   Macrodantin [Nitrofurantoin Macrocrystal] Itching, Nausea And Vomiting and Rash    Abdominal and chest pain, also- blood-red skin, also   Nitrofurantoin Diarrhea, Itching, Rash and Other (See Comments)    GI/stomach upset, sweating, chest pain, and skin turned bloody red   Tape Rash   Yellow Dye Rash    Social History:   Social History   Socioeconomic History   Marital status: Single    Spouse name: none   Number of children: 0   Years of education: College   Highest education level: Not on file  Occupational History   Occupation: n/a  Tobacco Use   Smoking status: Never   Smokeless tobacco: Never  Vaping Use   Vaping Use: Never used  Substance and Sexual Activity   Alcohol use: No    Alcohol/week: 0.0 standard drinks   Drug use: No   Sexual activity: Not on file  Other Topics Concern   Not on file  Social History Narrative   Lives alone   Laurann Montana, RN    Caffeine use: 2 glasses tea/day   Brother   Mother passed in ?73 at 81, father passed in 63's in 2006   She was a Pharmacist, hospital      Social Determinants of Radio broadcast assistant Strain: Low Risk    Difficulty of Paying Living Expenses: Not hard at all  Food Insecurity: No Food Insecurity   Worried About Charity fundraiser in the Last Year: Never true   Arboriculturist in the Last Year: Never true  Transportation Needs: Scientific laboratory technician  Needs   Lack of Transportation (Medical): Yes   Lack of Transportation (Non-Medical): Yes  Physical Activity: Not on file  Stress: No Stress Concern Present   Feeling of Stress : Only a little  Social Connections:  Moderately Integrated   Frequency of Communication with Friends and Family: Twice a week   Frequency of Social Gatherings with Friends and Family: Twice a week   Attends Religious Services: 1 to 4 times per year   Active Member of [employer] or Organizations: Yes   Attends Music therapist: 1 to 4 times per year   Marital Status: Never married  Human resources officer Violence: Not At Risk   Fear of Current or Ex-Partner: No   Emotionally Abused: No   Physically Abused: No   Sexually Abused: No    Family History:    Family History  Problem Relation Age of Onset   CAD Father    Asthma Father    Hypertension Father    Alzheimer's disease Father    Emphysema Father    COPD Father    Heart failure Mother    Peripheral vascular disease Mother    Stroke Mother    Hypertension Mother    Colonic polyp Mother    Cancer Brother        melanoma     ROS:  Please see the history of present illness.   All other ROS reviewed and negative.     Physical Exam/Data:   Vitals:   08/25/21 0630 08/25/21 0645 08/25/21 0925 08/25/21 1200  BP: (!) 167/67 (!) 165/67 134/75 (!) 154/81  Pulse: 65 67 72 75  Resp:    18  Temp:   97.9 F (36.6 C) 98.3 F (36.8 C)  TempSrc:   Oral Oral  SpO2: 97% 96% 95% 98%  Weight:      Height:        Intake/Output Summary (Last 24 hours) at 08/25/2021 1233 Last data filed at 08/25/2021 0641 Gross per 24 hour  Intake 28.38 ml  Output 400 ml  Net -371.62 ml      08/24/2021    9:02 PM 05/11/2021    4:26 PM 02/01/2021    2:23 PM  Last 3 Weights  Weight (lbs) 190 lb 14.7 oz 191 lb 191 lb 3.2 oz  Weight (kg) 86.6 kg 86.637 kg 86.728 kg     Body mass index is 32.77 kg/m.  General:  Well nourished, well developed, in no acute distress HEENT: normal Neck: no JVD Vascular: No carotid bruits; Distal pulses 2+ bilaterally Cardiac:  normal S1, S2; RRR; no murmur  Lungs:  clear to auscultation bilaterally, no wheezing, rhonchi or rales  Abd: soft,  nontender, no hepatomegaly  Ext: no edema Musculoskeletal:  No deformities, BUE and BLE strength normal and equal Skin: warm and dry  Neuro:  CNs 2-12 intact, no focal abnormalities noted Psych:  Normal affect   EKG:  The EKG was personally reviewed and demonstrates: Normal sinus rhythm, T wave inversion in aVL but not in the adjacent leads.  Unchanged when compared to the previous EKG from February. Telemetry:  Telemetry was personally reviewed and demonstrates: Normal sinus rhythm, no significant ventricular ectopy or recurrent A-fib.  Relevant CV Studies:  Echo 08/25/2021 1. Left ventricular ejection fraction, by estimation, is 65 to 70%. The  left ventricle has normal function. The left ventricle has no regional  wall motion abnormalities. There is mild left ventricular hypertrophy.  Left ventricular diastolic parameters  are consistent with  Grade I diastolic dysfunction (impaired relaxation).   2. Right ventricular systolic function is normal. The right ventricular  size is normal. Tricuspid regurgitation signal is inadequate for assessing  PA pressure.   3. Right atrial size was mildly dilated.   4. The mitral valve is grossly normal. Trivial mitral valve  regurgitation. No evidence of mitral stenosis.   5. The aortic valve is abnormal. There is severe calcifcation of the  aortic valve. Aortic valve regurgitation is trivial. Moderate aortic valve  stenosis. Aortic valve mean gradient measures 23.8 mmHg. Aortic valve Vmax  measures 3.30 m/s.   6. Aortic dilatation noted. There is mild dilatation of the ascending  aorta, measuring 41 mm.   7. The inferior vena cava is normal in size with greater than 50%  respiratory variability, suggesting right atrial pressure of 3 mmHg.   8. Increased flow velocities may be secondary to anemia, thyrotoxicosis,  hyperdynamic or high flow state.   Comparison(s): A prior study was performed on 01/10/21. Changes from prior  study are noted.  Aortic valve systolic gradient has increased.   Laboratory Data:  High Sensitivity Troponin:   Recent Labs  Lab 08/24/21 2129 08/24/21 2310 08/25/21 0247 08/25/21 0606  TROPONINIHS 13 13 13 11      Chemistry Recent Labs  Lab 08/24/21 2129  NA 140  K 3.8  CL 109  CO2 23  GLUCOSE 106*  BUN 12  CREATININE 0.92  CALCIUM 9.0  GFRNONAA >60  ANIONGAP 8    Recent Labs  Lab 08/24/21 2129  PROT 6.7  ALBUMIN 3.7  AST 22  ALT 19  ALKPHOS 57  BILITOT 0.8   Lipids No results for input(s): CHOL, TRIG, HDL, LABVLDL, LDLCALC, CHOLHDL in the last 168 hours.  Hematology Recent Labs  Lab 08/24/21 2129  WBC 7.0  RBC 3.80*  HGB 10.0*  HCT 32.2*  MCV 84.7  MCH 26.3  MCHC 31.1  RDW 14.8  PLT 280   Thyroid No results for input(s): TSH, FREET4 in the last 168 hours.  BNPNo results for input(s): BNP, PROBNP in the last 168 hours.  DDimer No results for input(s): DDIMER in the last 168 hours.   Radiology/Studies:  DG Chest Port 1 View  Result Date: 08/24/2021 CLINICAL DATA:  Chest pain EXAM: PORTABLE CHEST 1 VIEW COMPARISON:  01/09/2021 FINDINGS: Lungs are clear.  No pleural effusion or pneumothorax. The heart is normal in size. Mild degenerative changes of the thoracic spine. IMPRESSION: No evidence of acute cardiopulmonary disease. Electronically Signed   By: Julian Hy M.D.   On: 08/24/2021 21:14   DG Foot Complete Left  Result Date: 08/25/2021 Please see detailed radiograph report in office note.  ECHOCARDIOGRAM COMPLETE  Result Date: 08/25/2021    ECHOCARDIOGRAM REPORT   Patient Name:   Jennifer [name] Date of Exam: 08/25/2021 Medical Rec #:  [id number]        Height:       64.0 in Accession #:    [id number]       Weight:       190.9 lb Date of Birth:  06/22/1945        BSA:          1.918 m Patient Age:    62 years         BP:           169/76 mmHg Patient Gender: F                HR:  71 bpm. Exam Location:  Inpatient Procedure: 2D Echo, Color Doppler and  Cardiac Doppler Indications:    R07.9* Chest pain, unspecified  History:        Patient has prior history of Echocardiogram examinations, most                 recent 01/10/2021. CHF; Risk Factors:Hypertension and Sleep                 Apnea.  Sonographer:    Irving Burton Senior RDCS Referring Phys: [id number] VASUNDHRA RATHORE IMPRESSIONS  1. Left ventricular ejection fraction, by estimation, is 65 to 70%. The left ventricle has normal function. The left ventricle has no regional wall motion abnormalities. There is mild left ventricular hypertrophy. Left ventricular diastolic parameters are consistent with Grade I diastolic dysfunction (impaired relaxation).  2. Right ventricular systolic function is normal. The right ventricular size is normal. Tricuspid regurgitation signal is inadequate for assessing PA pressure.  3. Right atrial size was mildly dilated.  4. The mitral valve is grossly normal. Trivial mitral valve regurgitation. No evidence of mitral stenosis.  5. The aortic valve is abnormal. There is severe calcifcation of the aortic valve. Aortic valve regurgitation is trivial. Moderate aortic valve stenosis. Aortic valve mean gradient measures 23.8 mmHg. Aortic valve Vmax measures 3.30 m/s.  6. Aortic dilatation noted. There is mild dilatation of the ascending aorta, measuring 41 mm.  7. The inferior vena cava is normal in size with greater than 50% respiratory variability, suggesting right atrial pressure of 3 mmHg.  8. Increased flow velocities may be secondary to anemia, thyrotoxicosis, hyperdynamic or high flow state. Comparison(s): A prior study was performed on 01/10/21. Changes from prior study are noted. Aortic valve systolic gradient has increased. FINDINGS  Left Ventricle: Left ventricular ejection fraction, by estimation, is 65 to 70%. The left ventricle has normal function. The left ventricle has no regional wall motion abnormalities. The left ventricular internal cavity size was normal in size. There is   mild left ventricular hypertrophy. Left ventricular diastolic parameters are consistent with Grade I diastolic dysfunction (impaired relaxation). Right Ventricle: The right ventricular size is normal. No increase in right ventricular wall thickness. Right ventricular systolic function is normal. Tricuspid regurgitation signal is inadequate for assessing PA pressure. Left Atrium: Left atrial size was normal in size. Right Atrium: Right atrial size was mildly dilated. Pericardium: Trivial pericardial effusion is present. Mitral Valve: The mitral valve is grossly normal. Mild mitral annular calcification. Trivial mitral valve regurgitation. No evidence of mitral valve stenosis. Tricuspid Valve: The tricuspid valve is normal in structure. Tricuspid valve regurgitation is trivial. No evidence of tricuspid stenosis. Aortic Valve: The aortic valve is abnormal. There is severe calcifcation of the aortic valve. Aortic valve regurgitation is trivial. Moderate aortic stenosis is present. Aortic valve mean gradient measures 23.8 mmHg. Aortic valve peak gradient measures 43.5 mmHg. Aortic valve area, by VTI measures 1.66 cm. Pulmonic Valve: The pulmonic valve was normal in structure. Pulmonic valve regurgitation is not visualized. No evidence of pulmonic stenosis. Aorta: Aortic dilatation noted. There is mild dilatation of the ascending aorta, measuring 41 mm. Venous: The inferior vena cava is normal in size with greater than 50% respiratory variability, suggesting right atrial pressure of 3 mmHg. IAS/Shunts: No atrial level shunt detected by color flow Doppler.  LEFT VENTRICLE PLAX 2D LVIDd:         4.30 cm   Diastology LVIDs:         2.30 cm  LV e' medial:    5.66 cm/s LV PW:         1.20 cm   LV E/e' medial:  12.7 LV IVS:        1.30 cm   LV e' lateral:   6.85 cm/s LVOT diam:     2.10 cm   LV E/e' lateral: 10.5 LV SV:         119 LV SV Index:   62 LVOT Area:     3.46 cm  RIGHT VENTRICLE RV S prime:     21.80 cm/s TAPSE  (M-mode): 3.3 cm LEFT ATRIUM             Index        RIGHT ATRIUM           Index LA diam:        3.70 cm 1.93 cm/m   RA Area:     20.50 cm LA Vol (A2C):   58.4 ml 30.45 ml/m  RA Volume:   54.70 ml  28.52 ml/m LA Vol (A4C):   56.4 ml 29.40 ml/m LA Biplane Vol: 61.5 ml 32.06 ml/m  AORTIC VALVE AV Area (Vmax):    1.61 cm AV Area (Vmean):   1.55 cm AV Area (VTI):     1.66 cm AV Vmax:           329.63 cm/s AV Vmean:          240.676 cm/s AV VTI:            0.720 m AV Peak Grad:      43.5 mmHg AV Mean Grad:      23.8 mmHg LVOT Vmax:         153.00 cm/s LVOT Vmean:        108.000 cm/s LVOT VTI:          0.345 m LVOT/AV VTI ratio: 0.48  AORTA Ao Root diam: 3.00 cm Ao Asc diam:  4.10 cm MITRAL VALVE MV Area (PHT): 2.43 cm     SHUNTS MV Decel Time: 312 msec     Systemic VTI:  0.34 m MV E velocity: 71.80 cm/s   Systemic Diam: 2.10 cm MV A velocity: 110.00 cm/s MV E/A ratio:  0.65 Cherlynn Kaiser MD Electronically signed by Cherlynn Kaiser MD Signature Date/Time: 08/25/2021/10:43:47 AM    Final      Assessment and Plan:   Chest pain  -Characteristic of the chest pain somewhat atypical.  Will discuss with MD.  Currently receiving IV nitroglycerin.  -Chest pain accompanied by trouble swallowing and tenderness on palpation.  Despite hours of chest pain, serial troponin negative x4.  -Previous cardiac catheterization in June 2022 demonstrated single-vessel CAD with very tortuous LAD, significant distal LAD lesion and third diagonal lesion that difficult to treat due to possibility of dissection.  -Echocardiogram obtained during this admission showed a normal EF.  Will discuss with MD regarding if needed additional ischemic eval.  Patient likely will require a upper endoscopy by GI given trouble swallowing solid food in the past week.  CAD: Intolerant of aspirin and ranolazine.  She had sinus pause and Mobitz 1 on nebivolol.  Was able to tolerate low-dose metoprolol.  Hypertension: Systolic blood pressure  [id number] on arrival, currently on nitro drip.  Her symptom is very unusual for cardiac cause, discussed the case with Dr. Harriet Masson, recommend proceed with GI work-up given trouble swallowing.  Restart home medication  Hyperlipidemia  Risk Assessment/Risk Scores:     HEAR Score (  for undifferentiated chest pain):  HEAR Score: 6     Patient seen and examined, note reviewed with the signed Advanced Practice Provider. I personally reviewed laboratory data, imaging studies and relevant notes. I independently examined the patient and formulated the important aspects of the plan. I have personally discussed the plan with the patient and/or family. Comments or changes to the note/plan are indicated below.  Patient was seen and examined at her bedside. Chest pain is atypical and not concerning for anginal. She has been on nitro gtt - please titrate off. So far troponin negative. Echo EF normal no wall motion abnormalities. Please restart home antihypertensive.  Recommend getting GI evaluation. Continue lopressor and Eliquis for atrial flutter.  Berniece Salines DO, MS [facility] Attending Cardiologist [place]  [address] [place], [place] [address] [phone] Website: [email or web address]      For questions or updates, please contact [place] Please consult [email or web address] for contact info under    Hilbert Corrigan, [place]  08/25/2021 12:33 PM

## 2021-08-25 NOTE — Consult Note (Signed)
[facility] Primary Care Physician:  Jarrett SohoWharton, Courtney, PA-C Primary Gastroenterologist:  Dr. Vivien RossettiBuccini/ [facility]  Reason for Consultation:  Dysphagia  HPI: Domenica ReamerBarbara E Murtha is a 76 y.o. female with medical history significant of CAD, atrial flutter on Eliquis, hypertension, hyperlipidemia, chronic pain syndrome, GERD, OSA intolerant of CPAP presented to the ED complaining of chest pain for several days.  Patient reports starting on the 18th she began to have racing pulse followed by left-sided chest pain and pressure.  She noted that she had decreased appetite as well.  The pain continued through the weekend and began to worsen Wednesday of this week.  She aldo began to have  increased gas and some indigestion.  Patient tried taking her cardiac medications with minimal relief of symptoms.  She usually takes omeprazole 20 mg once a day and sucralfate once daily.  She attempted taking sucralfate and omeprazole twice daily with minimal relief of symptoms.  Thursday she presented to the ED due to worsening chest pain and concern for possible cardiac issues.  Patient has so far had a negative cardiac work-up and noted that on Thursday she began to feel some increased pain and difficulty with swallowing.  Denies food feeling stuck in her esophagus but notes some difficulty with her sucralfate tablets causing some discomfort.  Denies previous episodes of dysphagia or previous esophageal dilations.  Last EGD was in 2011 notable for benign gastric polyps otherwise normal.   Patient is on Eliquis twice daily for A-fib.  Denies NSAID use.  Denies alcohol, smoking. Denies family history of colon cancer.  Colonoscopy 06/22/2004 normal, recall 5 years for family history  Past Medical History:  Diagnosis Date   Acute meniscal tear of left knee    FOLLOWED BY DR GIAFFREY   Atrial flutter (HCC)    AV block, Mobitz 1    BPPV (benign paroxysmal positional vertigo)    Chronic bladder pain    Chronic  fatigue syndrome    Chronic low back pain    W/ RIGHT LEG PAIN AND NUMBNESS   Cystitis, chronic    Fibromyalgia    GERD (gastroesophageal reflux disease)    Hiatal hernia    Hypertension    IC (interstitial cystitis)    OSA (obstructive sleep apnea)    per pt study yrs ago-- moderate osa ,  intolerant cpap   Pinched vertebral nerve    bilateral L2 -- L3 and L3 -- L4-/  epidural injection's treatment , PT and pain clince   PONV (postoperative nausea and vomiting)    severe   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    Self-catheterizes urinary bladder    Spinal stenosis, lumbar region with neurogenic claudication    Wears glasses    Wears partial dentures    upper and lower    Past Surgical History:  Procedure Laterality Date   ABDOMINAL AORTOGRAM N/A 09/16/2020   Procedure: ABDOMINAL AORTOGRAM;  Surgeon: Iran OuchArida, Muhammad A, MD;  Location: [facility];  Service: Cardiovascular;  Laterality: N/A;   ABDOMINAL HYSTERECTOMY  1980   w/ Left Salpingoophorectomy   CARDIAC CATHETERIZATION  08-21-2001  dr Verdis Primehenry smith   normal coronary arteries and LVF   CARPAL TUNNEL RELEASE Bilateral 1986   CE[facility]  [facility]   CHOLECY[facility]ECT[facility]Y  1992   CYSTO WITH HYDRODISTENSION N/A 01/12/2016   Procedure: CYSTOSCOPY/HYDRODISTENSION;  Surgeon: Jethro BolusSigmund Tannenbaum, MD;  Location: [facility];  Service: Urology;  Laterality: N/A;   CYSTO WITH HYDRODISTENSION N/A 08/30/2016   Procedure: CYSTOSCOPY/HYDRODISTENSION OF BLADDER INJECTION OF MARCAINE/PYRIDIUM;  Surgeon: Carolan Clines, MD;  Location: [facility];  Service: Urology;  Laterality: N/A;   LEFT HEART CATH AND CORONARY ANGIOGRAPHY N/A 09/16/2020   Procedure: LEFT HEART CATH AND CORONARY ANGIOGRAPHY;  Surgeon: Wellington Hampshire, MD;  Location: [place] [facility];  Service: Cardiovascular;  Laterality: N/A;   MULTIPLE CYSTO/  HYDRODISTENTION/   INSTILLATION THERAPY  last one 08-19-2009   NASAL SINUS SURGERY  1987   TONSILLECTOMY  as child   VAGINAL GROWTH REMOVED  as teen    Prior to Admission medications   Medication Sig Start Date End Date Taking? Authorizing Provider  acetaminophen (TYLENOL) 500 MG tablet Take 500 mg by mouth 2 (two) times daily as needed (for pain).   Yes [provider]  apixaban (ELIQUIS) 5 MG TABS tablet Take 1 tablet (5 mg total) by mouth 2 (two) times daily. 02/01/21  Yes [name], Dayna N, PA-C  dextromethorphan-guaiFENesin (MUCINEX DM) 30-600 MG 12hr tablet Take 1 tablet by mouth every 12 (twelve) hours as needed for cough.   Yes [provider]  fexofenadine (ALLEGRA) 60 MG tablet Take 60 mg by mouth daily as needed for allergies. 01/06/14  Yes [provider]  gabapentin (NEURONTIN) 300 MG capsule Take 300 mg by mouth at bedtime. 07/28/21  Yes [provider]  hydrALAZINE (APRESOLINE) 50 MG tablet Take 1 tablet (50 mg total) by mouth 3 (three) times daily. 12/09/20  Yes Chandrasekhar, Mahesh A, MD  HYDROcodone-acetaminophen (NORCO) 7.5-325 MG tablet Take 1 tablet by mouth every 8 (eight) hours as needed for moderate pain or severe pain. 12/22/20  Yes [provider]  hydrocortisone (ANUSOL-HC) 25 MG suppository Place 25 mg rectally 2 (two) times daily as needed for hemorrhoids. 12/07/20  Yes [provider]  isosorbide dinitrate (ISORDIL) 10 MG tablet TAKE 1 TABLET BY MOUTH THREE TIMES A DAY Patient taking differently: Take 10 mg by mouth 3 (three) times daily. 12/19/20  Yes Chandrasekhar, Mahesh A, MD  losartan (COZAAR) 50 MG tablet Take 1 tablet (50 mg total) by mouth daily. 02/01/21 08/24/21 Yes [name], Nedra Hai, PA-C  meclizine (ANTIVERT) 25 MG tablet Take 25 mg by mouth 3 (three) times daily as needed for dizziness.   Yes [provider]  Methen-Hyosc-Meth Blue-Na Phos (UROGESIC-BLUE) 81.6 MG TABS Take 1 tablet by mouth every 6 (six) hours as needed (urinary  burning). 08/21/21  Yes [provider]  metoprolol tartrate (LOPRESSOR) 25 MG tablet Take 1 tablet (25 mg total) by mouth 2 (two) times daily. 02/10/21  Yes Chandrasekhar, Mahesh A, MD  nitroGLYCERIN (NITROSTAT) 0.4 MG SL tablet Place 1 tablet (0.4 mg total) under the tongue every 5 (five) minutes as needed for chest pain. Take up to 3 tablets 01/18/21  Yes Chandrasekhar, Mahesh A, MD  nystatin cream (MYCOSTATIN) Apply topically 2 (two) times daily. Patient taking differently: Apply 1 application. topically 2 (two) times daily as needed for dry skin. 08/04/20  Yes Campbell Riches, MD  omeprazole (PRILOSEC) 20 MG capsule Take 20 mg by mouth See admin instructions. Take 20 mg by mouth in the morning one hour before breakfast and an additional 20 mg before evening meal as needed for GERD 02/08/14  Yes [provider]  ondansetron (ZOFRAN-ODT) 4 MG disintegrating tablet Take 4 mg by mouth every 8 (eight) hours as needed for nausea (dissolve orally). 07/01/17  Yes [provider]  polyethylene glycol powder (GLYCOLAX/MIRALAX) 17 GM/SCOOP powder Take 17 g by mouth daily as needed for mild constipation.   Yes [provider]  promethazine (PHENERGAN) 25 MG tablet Take 25 mg by mouth every 6 (six) hours as needed for vomiting or nausea. 10/20/20  Yes [provider]  spironolactone (ALDACTONE) 25 MG tablet Take 1 tablet (25 mg total) by mouth daily. 11/03/20  Yes Chandrasekhar, Mahesh A, MD  sucralfate (CARAFATE) 1 G tablet Take 1 g by mouth See admin instructions. Take 1 gram by mouth in the morning and an additional 1 gram once a day for flares   Yes [provider]  triamcinolone cream (KENALOG) 0.1 % Apply 1 application. topically 2 (two) times daily as needed (rash). 04/06/21  Yes [provider]  ezetimibe (ZETIA) 10 MG tablet Take 1 tablet (10 mg total) by mouth daily. Patient not taking: Reported on 06/08/2021 05/11/21   Werner Lean, MD   fosfomycin (MONUROL) 3 g PACK SMARTSIG:1 By Mouth Every 3 Days Patient not taking: Reported on 08/24/2021 08/01/21   [provider]  Meth-Hyo-M Bl-Benz Acd-Ph Sal 81.6 MG TABS Take 1 tablet (81.6 mg total) by mouth 2 (two) times daily as needed. Patient not taking: Reported on 08/24/2021 06/07/17   Campbell Riches, MD    Scheduled Meds:  gabapentin  300 mg Oral QHS   hydrALAZINE  50 mg Oral TID   isosorbide dinitrate  10 mg Oral TID   losartan  50 mg Oral Daily   metoprolol tartrate  25 mg Oral BID   [START ON 08/26/2021] spironolactone  25 mg Oral Daily   sucralfate  1 g Oral QAC breakfast   Continuous Infusions: PRN Meds:.acetaminophen **OR** acetaminophen, HYDROcodone-acetaminophen, sucralfate  Allergies as of 08/24/2021 - Review Complete 08/24/2021  Allergen Reaction Noted   Moxifloxacin Hives, Shortness Of Breath, Swelling, and Rash 01/10/2016   Quinolones Anaphylaxis 03/21/2015   Sulfa antibiotics Hives and Rash 03/21/2015   Furosemide Other (See Comments) 09/15/2020   Aspirin Nausea Only and Other (See Comments) 03/21/2015   Doxazosin  02/01/2021   Duloxetine hcl Nausea Only and Other (See Comments) 08/02/2020   Hydrochlorothiazide Other (See Comments) 08/02/2020   Ibuprofen Nausea Only and Other (See Comments) 11/10/2015   Nebivolol hcl  04/27/2021   Other Other (See Comments) 09/15/2020   Ranexa [ranolazine]  02/01/2021   Robaxin [methocarbamol] Other (See Comments) 09/15/2020   Topiramate Itching 08/02/2020   Xarelto [rivaroxaban]  02/01/2021   Zofran [ondansetron hcl] Other (See Comments) 09/15/2020   Codeine Hives, Nausea And Vomiting, and Rash 03/21/2015   Dimethyl sulfoxide Hives and Itching 05/03/2015   Macrodantin [nitrofurantoin macrocrystal] Itching, Nausea And Vomiting, and Rash 02/15/2017   Nitrofurantoin Diarrhea, Itching, Rash, and Other (See Comments) 03/21/2015   Tape Rash 11/10/2015   Yellow dye Rash 03/21/2015    Family History  Problem  Relation Age of Onset   CAD Father    Asthma Father    Hypertension Father    Alzheimer's disease Father    Emphysema Father    COPD Father    Heart failure Mother    Peripheral vascular disease Mother    Stroke Mother    Hypertension Mother    Colonic polyp Mother    Cancer Brother        melanoma    Social History   Socioeconomic History   Marital status: Single    Spouse name: none   Number of children: 0  Years of education: College   Highest education level: Not on file  Occupational History   Occupation: n/a  Tobacco Use   Smoking status: Never   Smokeless tobacco: Never  Vaping Use   Vaping Use: Never used  Substance and Sexual Activity   Alcohol use: No    Alcohol/week: 0.0 standard drinks   Drug use: No   Sexual activity: Not on file  Other Topics Concern   Not on file  Social History Narrative   Lives alone   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[place], RN    Caffeine use: 2 glasses tea/day   Brother   Mother passed in ?73 at 46, father passed in 66's in 2006   She was a Pharmacist, hospital      Social Determinants of Radio broadcast assistant Strain: Low Risk    Difficulty of Paying Living Expenses: Not hard at all  Food Insecurity: No Food Insecurity   Worried About Charity fundraiser in the Last Year: Never true   Arboriculturist in the Last Year: Never true  Transportation Needs: Unmet Transportation Needs   Lack of Transportation (Medical): Yes   Lack of Transportation (Non-Medical): Yes  Physical Activity: Not on file  Stress: No Stress Concern Present   Feeling of Stress : Only a little  Social Connections: Moderately Integrated   Frequency of Communication with Friends and Family: Twice a week   Frequency of Social Gatherings with Friends and Family: Twice a week   Attends Religious Services: 1 to 4 times per year   Active Member of [employer] or Organizations: Yes   Attends Archivist Meetings: 1 to 4 times per year   Marital Status: Never married  Arboriculturist Violence: Not At Risk   Fear of Current or Ex-Partner: No   Emotionally Abused: No   Physically Abused: No   Sexually Abused: No    Review of Systems: Review of Systems  Constitutional:  Positive for malaise/fatigue. Negative for chills and fever.  HENT:  Negative for hearing loss and tinnitus.   Eyes:  Negative for blurred vision and double vision.  Respiratory:  Negative for cough and hemoptysis.   Cardiovascular:  Positive for chest pain and palpitations.  Gastrointestinal:  Positive for heartburn and nausea. Negative for abdominal pain, blood in stool, constipation, diarrhea, melena and vomiting.  Genitourinary:  Negative for dysuria and urgency.  Musculoskeletal:  Negative for myalgias and neck pain.  Skin:  Negative for itching and rash.  Neurological:  Positive for weakness. Negative for dizziness and headaches.  Endo/Heme/Allergies:  Negative for environmental allergies. Does not bruise/bleed easily.  Psychiatric/Behavioral:  Negative for depression, substance abuse and suicidal ideas.     Physical Exam:Physical Exam Constitutional:      General: She is not in acute distress.    Appearance: Normal appearance. She is obese.  HENT:     Head: Normocephalic and atraumatic.     Right Ear: External ear normal.     Left Ear: External ear normal.     Nose: Nose normal.     Mouth/Throat:     Mouth: Mucous membranes are moist.  Eyes:     Pupils: Pupils are equal, round, and reactive to light.  Cardiovascular:     Rate and Rhythm: Normal rate and regular rhythm.     Pulses: Normal pulses.     Heart sounds: Normal heart sounds.  Pulmonary:     Effort: Pulmonary effort is normal.     Breath  sounds: Normal breath sounds.  Abdominal:     General: Abdomen is flat. Bowel sounds are normal. There is no distension.     Palpations: Abdomen is soft. There is no mass.     Tenderness: There is abdominal tenderness (mild lower abdominal). There is no guarding or rebound.      Hernia: No hernia is present.  Musculoskeletal:        General: Normal range of motion.     Cervical back: Normal range of motion and neck supple.  Skin:    General: Skin is warm and dry.  Neurological:     General: No focal deficit present.     Mental Status: She is alert and oriented to person, place, and time. Mental status is at baseline.  Psychiatric:        Mood and Affect: Mood normal.        Behavior: Behavior normal.    Vital signs: Vitals:   08/25/21 1200 08/25/21 1344  BP: (!) 154/81 (!) 149/73  Pulse: 75   Resp: 18   Temp: 98.3 F (36.8 C)   SpO2: 98%    Last BM Date : 08/24/21    GI:  Lab Results: Recent Labs    08/24/21 2129  WBC 7.0  HGB 10.0*  HCT 32.2*  PLT 280   BMET Recent Labs    08/24/21 2129  NA 140  K 3.8  CL 109  CO2 23  GLUCOSE 106*  BUN 12  CREATININE 0.92  CALCIUM 9.0   LFT Recent Labs    08/24/21 2129  PROT 6.7  ALBUMIN 3.7  AST 22  ALT 19  ALKPHOS 57  BILITOT 0.8   PT/INR No results for input(s): LABPROT, INR in the last 72 hours.   Studies/Results: DG Chest Port 1 View  Result Date: 08/24/2021 CLINICAL DATA:  Chest pain EXAM: PORTABLE CHEST 1 VIEW COMPARISON:  01/09/2021 FINDINGS: Lungs are clear.  No pleural effusion or pneumothorax. The heart is normal in size. Mild degenerative changes of the thoracic spine. IMPRESSION: No evidence of acute cardiopulmonary disease. Electronically Signed   By: Julian Hy M.D.   On: 08/24/2021 21:14   DG Foot Complete Left  Result Date: 08/25/2021 Please see detailed radiograph report in office note.  ECHOCARDIOGRAM COMPLETE  Result Date: 08/25/2021    ECHOCARDIOGRAM REPORT   Patient Name:   MISHEEL JOKINEN Date of Exam: 08/25/2021 Medical Rec #:  [id number]        Height:       64.0 in Accession #:    [id number]       Weight:       190.9 lb Date of Birth:  [birth year]        BSA:          1.918 m Patient Age:    28 years         BP:           169/76 mmHg Patient Gender:  F                HR:           71 bpm. Exam Location:  Inpatient Procedure: 2D Echo, Color Doppler and Cardiac Doppler Indications:    R07.9* Chest pain, unspecified  History:        Patient has prior history of Echocardiogram examinations, most                 recent 01/10/2021. CHF; Risk Factors:Hypertension and Sleep  Apnea.  Sonographer:    Raquel Sarna Senior RDCS Referring Phys: [id number] [place]  1. Left ventricular ejection fraction, by estimation, is 65 to 70%. The left ventricle has normal function. The left ventricle has no regional wall motion abnormalities. There is mild left ventricular hypertrophy. Left ventricular diastolic parameters are consistent with Grade I diastolic dysfunction (impaired relaxation).  2. Right ventricular systolic function is normal. The right ventricular size is normal. Tricuspid regurgitation signal is inadequate for assessing PA pressure.  3. Right atrial size was mildly dilated.  4. The mitral valve is grossly normal. Trivial mitral valve regurgitation. No evidence of mitral stenosis.  5. The aortic valve is abnormal. There is severe calcifcation of the aortic valve. Aortic valve regurgitation is trivial. Moderate aortic valve stenosis. Aortic valve mean gradient measures 23.8 mmHg. Aortic valve Vmax measures 3.30 m/s.  6. Aortic dilatation noted. There is mild dilatation of the ascending aorta, measuring 41 mm.  7. The inferior vena cava is normal in size with greater than 50% respiratory variability, suggesting right atrial pressure of 3 mmHg.  8. Increased flow velocities may be secondary to anemia, thyrotoxicosis, hyperdynamic or high flow state. Comparison(s): A prior study was performed on 01/10/21. Changes from prior study are noted. Aortic valve systolic gradient has increased. FINDINGS  Left Ventricle: Left ventricular ejection fraction, by estimation, is 65 to 70%. The left ventricle has normal function. The left ventricle has no  regional wall motion abnormalities. The left ventricular internal cavity size was normal in size. There is  mild left ventricular hypertrophy. Left ventricular diastolic parameters are consistent with Grade I diastolic dysfunction (impaired relaxation). Right Ventricle: The right ventricular size is normal. No increase in right ventricular wall thickness. Right ventricular systolic function is normal. Tricuspid regurgitation signal is inadequate for assessing PA pressure. Left Atrium: Left atrial size was normal in size. Right Atrium: Right atrial size was mildly dilated. Pericardium: Trivial pericardial effusion is present. Mitral Valve: The mitral valve is grossly normal. Mild mitral annular calcification. Trivial mitral valve regurgitation. No evidence of mitral valve stenosis. Tricuspid Valve: The tricuspid valve is normal in structure. Tricuspid valve regurgitation is trivial. No evidence of tricuspid stenosis. Aortic Valve: The aortic valve is abnormal. There is severe calcifcation of the aortic valve. Aortic valve regurgitation is trivial. Moderate aortic stenosis is present. Aortic valve mean gradient measures 23.8 mmHg. Aortic valve peak gradient measures 43.5 mmHg. Aortic valve area, by VTI measures 1.66 cm. Pulmonic Valve: The pulmonic valve was normal in structure. Pulmonic valve regurgitation is not visualized. No evidence of pulmonic stenosis. Aorta: Aortic dilatation noted. There is mild dilatation of the ascending aorta, measuring 41 mm. Venous: The inferior vena cava is normal in size with greater than 50% respiratory variability, suggesting right atrial pressure of 3 mmHg. IAS/Shunts: No atrial level shunt detected by color flow Doppler.  LEFT VENTRICLE PLAX 2D LVIDd:         4.30 cm   Diastology LVIDs:         2.30 cm   LV e' medial:    5.66 cm/s LV PW:         1.20 cm   LV E/e' medial:  12.7 LV IVS:        1.30 cm   LV e' lateral:   6.85 cm/s LVOT diam:     2.10 cm   LV E/e' lateral: 10.5 LV SV:          119 LV SV Index:   62  LVOT Area:     3.46 cm  RIGHT VENTRICLE RV S prime:     21.80 cm/s TAPSE (M-mode): 3.3 cm LEFT ATRIUM             Index        RIGHT ATRIUM           Index LA diam:        3.70 cm 1.93 cm/m   RA Area:     20.50 cm LA Vol (A2C):   58.4 ml 30.45 ml/m  RA Volume:   54.70 ml  28.52 ml/m LA Vol (A4C):   56.4 ml 29.40 ml/m LA Biplane Vol: 61.5 ml 32.06 ml/m  AORTIC VALVE AV Area (Vmax):    1.61 cm AV Area (Vmean):   1.55 cm AV Area (VTI):     1.66 cm AV Vmax:           329.63 cm/s AV Vmean:          240.676 cm/s AV VTI:            0.720 m AV Peak Grad:      43.5 mmHg AV Mean Grad:      23.8 mmHg LVOT Vmax:         153.00 cm/s LVOT Vmean:        108.000 cm/s LVOT VTI:          0.345 m LVOT/AV VTI ratio: 0.48  AORTA Ao Root diam: 3.00 cm Ao Asc diam:  4.10 cm MITRAL VALVE MV Area (PHT): 2.43 cm     SHUNTS MV Decel Time: 312 msec     Systemic VTI:  0.34 m MV E velocity: 71.80 cm/s   Systemic Diam: 2.10 cm MV A velocity: 110.00 cm/s MV E/A ratio:  0.65 Cherlynn Kaiser MD Electronically signed by Cherlynn Kaiser MD Signature Date/Time: 08/25/2021/10:43:47 AM    Final     Impression: Chest pain Discomfort with swallowing  Patient with negative cardiac work-up thus far.  Minimal relief of symptoms with increasing PPI and sucralfate.  Patient with history of GERD.  Possible worsening reflux or ulcers.  Patient with new onset discomfort with swallowing specifically sucralfate tablet which is large.  Denies feeling the pill gets stuck but does note it may move slowly. Barium Swallow has been ordered we will evaluate for any masses or strictures or structural abnormalities that may be contributing to new swallowing complaints.    Plan: Possible worsening reflux -we will start Protonix 40 mg IV twice daily.  If she experiences relief may continue 40 mg twice daily outpatient.  Will increase sucralfate to 3 times daily.  We will follow results of barium swallow for further evaluation  of possible dysphagia.  [facility] will follow.  LOS: 0 days   Charlott Rakes  PA-C 08/25/2021, 1:57 PM  Contact #  [phone]

## 2021-08-25 NOTE — Progress Notes (Signed)
Patient arrived to the unit, VSS. Denies any pain. Comfort afforded.

## 2021-08-25 NOTE — ED Notes (Signed)
Patient transported upstairs on bedside monitor by this RN 

## 2021-08-25 NOTE — Progress Notes (Signed)
Echocardiogram 2D Echocardiogram has been performed.  Warren Lacy Danay Mckellar RDCS 08/25/2021, 8:44 AM
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
## 2021-08-26 DIAGNOSIS — I4892 Unspecified atrial flutter: Secondary | ICD-10-CM | POA: Diagnosis not present

## 2021-08-26 DIAGNOSIS — R0789 Other chest pain: Secondary | ICD-10-CM | POA: Diagnosis not present

## 2021-08-26 DIAGNOSIS — I10 Essential (primary) hypertension: Secondary | ICD-10-CM | POA: Diagnosis not present

## 2021-08-26 LAB — CBC
HCT: 31.8 % — ABNORMAL LOW (ref 36.0–46.0)
Hemoglobin: 10 g/dL — ABNORMAL LOW (ref 12.0–15.0)
MCH: 26.5 pg (ref 26.0–34.0)
MCHC: 31.4 g/dL (ref 30.0–36.0)
MCV: 84.4 fL (ref 80.0–100.0)
Platelets: 265 10*3/uL (ref 150–400)
RBC: 3.77 MIL/uL — ABNORMAL LOW (ref 3.87–5.11)
RDW: 14.8 % (ref 11.5–15.5)
WBC: 5.6 10*3/uL (ref 4.0–10.5)
nRBC: 0 % (ref 0.0–0.2)

## 2021-08-26 LAB — HEPARIN LEVEL (UNFRACTIONATED): Heparin Unfractionated: >1.1 IU/mL — ABNORMAL HIGH (ref 0.30–0.70)

## 2021-08-26 LAB — APTT
aPTT: 65 seconds — ABNORMAL HIGH (ref 24–36)
aPTT: 73 seconds — ABNORMAL HIGH (ref 24–36)

## 2021-08-26 MED ORDER — SODIUM CHLORIDE 0.9 % IV SOLN: INTRAVENOUS | Status: DC

## 2021-08-26 NOTE — Progress Notes (Signed)
ANTICOAGULATION CONSULT NOTE - Follow Up Consult  Pharmacy Consult for heparin Indication: atrial fibrillation  Labs: Recent Labs    08/24/21 2129 08/24/21 2310 08/25/21 0247 08/25/21 0606 08/26/21 0436  HGB 10.0*  --   --   --  10.0*  HCT 32.2*  --   --   --  31.8*  PLT 280  --   --   --  265  APTT  --   --   --   --  65*  HEPARINUNFRC  --   --   --   --  >1.10*  CREATININE 0.92  --   --   --   --   TROPONINIHS 13 13 13 11   --     Assessment: 75yo female slightly subtherapeutic on heparin with initial dosing while Eliquis on hold; no infusion issues or signs of bleeding per RN.  Goal of Therapy:  aPTT 66-102 seconds   Plan:  Will increase heparin infusion by 1 unit/kg/hr to 1200 units/hr and check PTT in 8 hours.    , PharmD, BCPS  08/26/2021,6:33 AM

## 2021-08-26 NOTE — Progress Notes (Signed)
ANTICOAGULATION CONSULT NOTE - Initial Consult  Pharmacy Consult for Heparin (Apixaban on hold) Indication: atrial fibrillation  Allergies  Allergen Reactions   Moxifloxacin Hives, Shortness Of Breath, Swelling and Rash    Rash was severe, caused tremors, ans skin became BLOODY RED   Quinolones Anaphylaxis   Sulfa Antibiotics Hives and Rash   Furosemide Other (See Comments)    CANNOT TAKE DUE TO Interstitial cystitis   Aspirin Nausea Only and Other (See Comments)    GI and stomach upset   Doxazosin     constipation   Duloxetine Hcl Nausea Only and Other (See Comments)    Vertigo and heart racing also    Hydrochlorothiazide Other (See Comments)    Headaches    Ibuprofen Nausea Only and Other (See Comments)    GI upset   Nebivolol Hcl     Other reaction(s): extreme fatigue   Other Other (See Comments)   Ranexa [Ranolazine]     Constipation/heart pounding   Robaxin [Methocarbamol] Other (See Comments)    "Make me feel flu-like symptoms"   Topiramate Itching   Xarelto [Rivaroxaban]     Back pain   Zofran [Ondansetron Hcl] Other (See Comments)    Headaches    Codeine Hives, Nausea And Vomiting and Rash   Dimethyl Sulfoxide Hives and Itching   Macrodantin [Nitrofurantoin Macrocrystal] Itching, Nausea And Vomiting and Rash    Abdominal and chest pain, also- blood-red skin, also   Nitrofurantoin Diarrhea, Itching, Rash and Other (See Comments)    GI/stomach upset, sweating, chest pain, and skin turned bloody red   Tape Rash   Yellow Dye Rash    Patient Measurements: Height: 5\' 4"  (162.6 cm) Weight: 88.3 kg (194 lb 10.7 oz) IBW/kg (Calculated) : 54.7 Heparin Dosing Weight: 74 kg  Vital Signs: Temp: 98.1 F (36.7 C) (05/27 0757) Temp Source: Oral (05/27 0757) BP: 143/64 (05/27 0757) Pulse Rate: 63 (05/27 0757)  Labs: Recent Labs    08/24/21 2129 08/24/21 2310 08/25/21 0247 08/25/21 0606 08/26/21 0436 08/26/21 1323  HGB 10.0*  --   --   --  10.0*  --   HCT  32.2*  --   --   --  31.8*  --   PLT 280  --   --   --  265  --   APTT  --   --   --   --  65* 73*  HEPARINUNFRC  --   --   --   --  >1.10*  --   CREATININE 0.92  --   --   --   --   --   TROPONINIHS 13 13 13 11   --   --      Estimated Creatinine Clearance: 58.5 mL/min (by C-G formula based on SCr of 0.92 mg/dL).   Medical History: Past Medical History:  Diagnosis Date   Acute meniscal tear of left knee    FOLLOWED BY DR GIAFFREY   Atrial flutter (HCC)    AV block, Mobitz 1    BPPV (benign paroxysmal positional vertigo)    Chronic bladder pain    Chronic fatigue syndrome    Chronic low back pain    W/ RIGHT LEG PAIN AND NUMBNESS   Cystitis, chronic    Fibromyalgia    GERD (gastroesophageal reflux disease)    Hiatal hernia    Hypertension    IC (interstitial cystitis)    OSA (obstructive sleep apnea)    per pt study yrs ago-- moderate osa ,  intolerant cpap   Pinched vertebral nerve    bilateral L2 -- L3 and L3 -- L4-/  epidural injection's treatment , PT and pain clince   PONV (postoperative nausea and vomiting)    severe   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 Last dose of Apixaban given 5/26 at 0937.  Admitted 5/25 pm with chest pain, noted somewhat atypical.   Will monitor heparin with aPTTs, while heparin levels are expected to be falsely high due to recent Apixaban doses.  APTT therapeutic at 73, CBC WNL, no bleeding noted. Will continue to follow plans for intervention and resume Eliquis when indicated.  Goal of Therapy:  Heparin level 0.3-0.7 units/ml aPTT 66-102 seconds Monitor platelets by anticoagulation protocol: Yes   Plan:   Continue heparin 1200 units/hr Will defer recheck until AM Apixban on hold.  Thank you for allowing pharmacy to participate in this patient's care.  Reatha Harps, PharmD PGY1 Pharmacy Resident 08/26/2021 2:49 PM Check [email or web address] for unit specific pharmacy number

## 2021-08-26 NOTE — Assessment & Plan Note (Signed)
-   Last heart cath performed 09/16/2020 showing single-vessel CAD involving LAD however was very tortuous and not amenable to intervention.  She is medically managed

## 2021-08-26 NOTE — Progress Notes (Signed)
Progress Note    Jennifer [name]   [id number]  DOB: January 07, 1946  DOA: 08/24/2021     1 PCP: Marda Stalker, PA-C  Initial CC: Chest pain  Hospital Course: Jennifer [name] is a 76 year old female with PMH GERD, fatigue syndrome, fibromyalgia, hiatal hernia, HTN, interstitial cystitis, OSA, lumbar spinal stenosis who presented with chest pain.  Initial concern on admission was for cardiac etiology and she underwent further work-up along with cardiology evaluation.  She was also initially started on nitroglycerin drip.  Her symptoms were considered to be noncardiac in etiology and she was weaned off of nitro drip.  Troponins remained negative with trending and echo obtained showed no wall motion abnormalities.  GI was then consulted and she underwent barium swallow study which showed esophageal narrowing just above small hiatal hernia with concern for possible Schatzki's ring.  Tentative plans are for undergoing inpatient EGD for further evaluation.  Interval History:  Seen this morning sitting in recliner feeling better compared to admission.  Tentative plan is for undergoing EGD with GI tomorrow.  Assessment and Plan: Chest pain - Considered noncardiac in etiology and has been evaluated by cardiology - Echo performed on admission shows EF 65 to 70%, normal LV function, no RWMA, mild LVH, grade 1 diastolic dysfunction - Troponins trended on admission and were negative x4 - Follow-up results of EGD tentatively planned for 08/27/2021 - hold heparin drip starting 4am on 08/27/21  Atrial flutter ([place]) - Eliquis on hold and currently on heparin drip in case of need for intervention - Eliquis to be resumed after invasive work-up concluded -Continue metoprolol  CAD (coronary artery disease) - Last heart cath performed 09/16/2020 showing single-vessel CAD involving LAD however was very tortuous and not amenable to intervention.  She is medically managed  HTN (hypertension) - Continue  hydralazine, Isordil, losartan, Lopressor  Chronic pain syndrome - Continue Norco and gabapentin  Hypertensive urgency-resolved as of 08/26/2021 - Possibly pain and anxiety related on admission along with some difficulty swallowing pills - Blood pressure has now become controlled - Continue hydralazine, Isordil, losartan, Lopressor   Old records reviewed in assessment of this patient  Antimicrobials:   DVT prophylaxis:    Heparin drip   Code Status:   Code Status: Full Code  Disposition Plan: Home Monday Status is: Inpatient  Objective: Blood pressure (!) 143/64, pulse 63, temperature 98.1 F (36.7 C), temperature source Oral, resp. rate 18, height 5\' 4"  (1.626 m), weight 88.3 kg, SpO2 99 %.  Examination:  Physical Exam Constitutional:      General: She is not in acute distress.    Appearance: Normal appearance.  HENT:     Head: Normocephalic and atraumatic.     Mouth/Throat:     Mouth: Mucous membranes are moist.  Eyes:     Extraocular Movements: Extraocular movements intact.  Cardiovascular:     Rate and Rhythm: Normal rate and regular rhythm.     Heart sounds: Murmur heard.     Comments: 3/6 HSM appreciated in upper sternal borders Pulmonary:     Effort: Pulmonary effort is normal.     Breath sounds: Normal breath sounds.  Abdominal:     General: Bowel sounds are normal. There is no distension.     Palpations: Abdomen is soft.     Tenderness: There is no abdominal tenderness.  Musculoskeletal:        General: Normal range of motion.     Cervical back: Normal range of motion and neck supple.  Skin:  General: Skin is warm and dry.  Neurological:     General: No focal deficit present.     Mental Status: She is alert.  Psychiatric:        Mood and Affect: Mood normal.        Behavior: Behavior normal.     Consultants:  Cardiology GI  Procedures:    Data Reviewed: Results for orders placed or performed during the hospital encounter of 08/24/21  (from the past 24 hour(s))  CBC     Status: Abnormal   Collection Time: 08/26/21  4:36 AM  Result Value Ref Range   WBC 5.6 4.0 - 10.5 K/uL   RBC 3.77 (L) 3.87 - 5.11 MIL/uL   Hemoglobin 10.0 (L) 12.0 - 15.0 g/dL   HCT 31.8 (L) 36.0 - 46.0 %   MCV 84.4 80.0 - 100.0 fL   MCH 26.5 26.0 - 34.0 pg   MCHC 31.4 30.0 - 36.0 g/dL   RDW 14.8 11.5 - 15.5 %   Platelets 265 150 - 400 K/uL   nRBC 0.0 0.0 - 0.2 %  APTT     Status: Abnormal   Collection Time: 08/26/21  4:36 AM  Result Value Ref Range   aPTT 65 (H) 24 - 36 seconds  Heparin level (unfractionated)     Status: Abnormal   Collection Time: 08/26/21  4:36 AM  Result Value Ref Range   Heparin Unfractionated >1.10 (H) 0.30 - 0.70 IU/mL    I have Reviewed nursing notes, Vitals, and Lab results since pt's last encounter. Pertinent lab results : see above I have ordered test including BMP, CBC, Mg I have reviewed the last note from staff over past 24 hours I have discussed pt's care plan and test results with nursing staff, case manager   LOS: 1 day   Dwyane Dee, MD [facility] 08/26/2021, 11:44 AM

## 2021-08-26 NOTE — Assessment & Plan Note (Addendum)
-   Continue Norco and gabapentin

## 2021-08-26 NOTE — Progress Notes (Signed)
Progress Note  Patient Name: Jennifer [name] Date of Encounter: 08/26/2021  [facility] Cardiologist: Werner Lean, MD   Subjective   Has been transitioned to heparin, unclear if procedure planned. Continues to have burning/pressure with swallowing even pills. Felt flutters this AM but has remained in NSR.  Inpatient Medications    Scheduled Meds:  gabapentin  300 mg Oral QHS   hydrALAZINE  50 mg Oral TID   isosorbide dinitrate  10 mg Oral TID   losartan  50 mg Oral Daily   metoprolol tartrate  25 mg Oral BID   pantoprazole (PROTONIX) IV  40 mg Intravenous Q12H   spironolactone  25 mg Oral Daily   sucralfate  1 g Oral TID WC & HS   Continuous Infusions:  heparin 1,200 Units/hr (08/26/21 0648)   PRN Meds: acetaminophen **OR** acetaminophen, alum & mag hydroxide-simeth, HYDROcodone-acetaminophen   Vital Signs    Vitals:   08/26/21 0400 08/26/21 0500 08/26/21 0600 08/26/21 0757  BP: (!) 163/72 (!) 142/66 (!) 108/59 (!) 143/64  Pulse: (!) 56 (!) 56 (!) 56 63  Resp:  18  18  Temp:  98 F (36.7 C)  98.1 F (36.7 C)  TempSrc:  Oral  Oral  SpO2: 96% 98% 96% 99%  Weight:  88.3 kg    Height:        Intake/Output Summary (Last 24 hours) at 08/26/2021 0910 Last data filed at 08/26/2021 0600 Gross per 24 hour  Intake 175.54 ml  Output 1100 ml  Net -924.46 ml      08/26/2021    5:00 AM 08/24/2021    9:02 PM 05/11/2021    4:26 PM  Last 3 Weights  Weight (lbs) 194 lb 10.7 oz 190 lb 14.7 oz 191 lb  Weight (kg) 88.3 kg 86.6 kg 86.637 kg      Telemetry    NSR - Personally Reviewed  ECG    08/24/21 NSR - Personally Reviewed  Physical Exam   GEN: No acute distress.   Neck: No JVD Cardiac: RRR, no murmurs, rubs, or gallops.  Respiratory: Clear to auscultation bilaterally. GI: Soft, nontender, non-distended  MS: No edema; No deformity. Neuro:  Nonfocal  Psych: Normal affect   Labs    High Sensitivity Troponin:   Recent Labs  Lab 08/24/21 2129  08/24/21 2310 08/25/21 0247 08/25/21 0606  TROPONINIHS 13 13 13 11      Chemistry Recent Labs  Lab 08/24/21 2129  NA 140  K 3.8  CL 109  CO2 23  GLUCOSE 106*  BUN 12  CREATININE 0.92  CALCIUM 9.0  PROT 6.7  ALBUMIN 3.7  AST 22  ALT 19  ALKPHOS 57  BILITOT 0.8  GFRNONAA >60  ANIONGAP 8    Lipids No results for input(s): CHOL, TRIG, HDL, LABVLDL, LDLCALC, CHOLHDL in the last 168 hours.  Hematology Recent Labs  Lab 08/24/21 2129 08/26/21 0436  WBC 7.0 5.6  RBC 3.80* 3.77*  HGB 10.0* 10.0*  HCT 32.2* 31.8*  MCV 84.7 84.4  MCH 26.3 26.5  MCHC 31.1 31.4  RDW 14.8 14.8  PLT 280 265   Thyroid No results for input(s): TSH, FREET4 in the last 168 hours.  BNPNo results for input(s): BNP, PROBNP in the last 168 hours.  DDimer No results for input(s): DDIMER in the last 168 hours.   Radiology    DG Chest Port 1 View  Result Date: 08/24/2021 CLINICAL DATA:  Chest pain EXAM: PORTABLE CHEST 1 VIEW COMPARISON:  01/09/2021 FINDINGS: Lungs are clear.  No pleural effusion or pneumothorax. The heart is normal in size. Mild degenerative changes of the thoracic spine. IMPRESSION: No evidence of acute cardiopulmonary disease. Electronically Signed   By: Julian Hy M.D.   On: 08/24/2021 21:14   DG Foot Complete Left  Result Date: 08/25/2021 Please see detailed radiograph report in office note.  ECHOCARDIOGRAM COMPLETE  Result Date: 08/25/2021    ECHOCARDIOGRAM REPORT   Patient Name:   Jennifer [name] Date of Exam: 08/25/2021 Medical Rec #:  [id number]        Height:       64.0 in Accession #:    [id number]       Weight:       190.9 lb Date of Birth:  1946-01-02        BSA:          1.918 m Patient Age:    76 years         BP:           169/76 mmHg Patient Gender: F                HR:           71 bpm. Exam Location:  Inpatient Procedure: 2D Echo, Color Doppler and Cardiac Doppler Indications:    R07.9* Chest pain, unspecified  History:        Patient has prior history of  Echocardiogram examinations, most                 recent 01/10/2021. CHF; Risk Factors:Hypertension and Sleep                 Apnea.  Sonographer:    Raquel Sarna Senior RDCS Referring Phys: [id number] [place]  1. Left ventricular ejection fraction, by estimation, is 65 to 70%. The left ventricle has normal function. The left ventricle has no regional wall motion abnormalities. There is mild left ventricular hypertrophy. Left ventricular diastolic parameters are consistent with Grade I diastolic dysfunction (impaired relaxation).  2. Right ventricular systolic function is normal. The right ventricular size is normal. Tricuspid regurgitation signal is inadequate for assessing PA pressure.  3. Right atrial size was mildly dilated.  4. The mitral valve is grossly normal. Trivial mitral valve regurgitation. No evidence of mitral stenosis.  5. The aortic valve is abnormal. There is severe calcifcation of the aortic valve. Aortic valve regurgitation is trivial. Moderate aortic valve stenosis. Aortic valve mean gradient measures 23.8 mmHg. Aortic valve Vmax measures 3.30 m/s.  6. Aortic dilatation noted. There is mild dilatation of the ascending aorta, measuring 41 mm.  7. The inferior vena cava is normal in size with greater than 50% respiratory variability, suggesting right atrial pressure of 3 mmHg.  8. Increased flow velocities may be secondary to anemia, thyrotoxicosis, hyperdynamic or high flow state. Comparison(s): A prior study was performed on 01/10/21. Changes from prior study are noted. Aortic valve systolic gradient has increased. FINDINGS  Left Ventricle: Left ventricular ejection fraction, by estimation, is 65 to 70%. The left ventricle has normal function. The left ventricle has no regional wall motion abnormalities. The left ventricular internal cavity size was normal in size. There is  mild left ventricular hypertrophy. Left ventricular diastolic parameters are consistent with Grade I diastolic  dysfunction (impaired relaxation). Right Ventricle: The right ventricular size is normal. No increase in right ventricular wall thickness. Right ventricular systolic function is normal. Tricuspid regurgitation signal is inadequate for assessing PA  pressure. Left Atrium: Left atrial size was normal in size. Right Atrium: Right atrial size was mildly dilated. Pericardium: Trivial pericardial effusion is present. Mitral Valve: The mitral valve is grossly normal. Mild mitral annular calcification. Trivial mitral valve regurgitation. No evidence of mitral valve stenosis. Tricuspid Valve: The tricuspid valve is normal in structure. Tricuspid valve regurgitation is trivial. No evidence of tricuspid stenosis. Aortic Valve: The aortic valve is abnormal. There is severe calcifcation of the aortic valve. Aortic valve regurgitation is trivial. Moderate aortic stenosis is present. Aortic valve mean gradient measures 23.8 mmHg. Aortic valve peak gradient measures 43.5 mmHg. Aortic valve area, by VTI measures 1.66 cm. Pulmonic Valve: The pulmonic valve was normal in structure. Pulmonic valve regurgitation is not visualized. No evidence of pulmonic stenosis. Aorta: Aortic dilatation noted. There is mild dilatation of the ascending aorta, measuring 41 mm. Venous: The inferior vena cava is normal in size with greater than 50% respiratory variability, suggesting right atrial pressure of 3 mmHg. IAS/Shunts: No atrial level shunt detected by color flow Doppler.  LEFT VENTRICLE PLAX 2D LVIDd:         4.30 cm   Diastology LVIDs:         2.30 cm   LV e' medial:    5.66 cm/s LV PW:         1.20 cm   LV E/e' medial:  12.7 LV IVS:        1.30 cm   LV e' lateral:   6.85 cm/s LVOT diam:     2.10 cm   LV E/e' lateral: 10.5 LV SV:         119 LV SV Index:   62 LVOT Area:     3.46 cm  RIGHT VENTRICLE RV S prime:     21.80 cm/s TAPSE (M-mode): 3.3 cm LEFT ATRIUM             Index        RIGHT ATRIUM           Index LA diam:        3.70 cm 1.93  cm/m   RA Area:     20.50 cm LA Vol (A2C):   58.4 ml 30.45 ml/m  RA Volume:   54.70 ml  28.52 ml/m LA Vol (A4C):   56.4 ml 29.40 ml/m LA Biplane Vol: 61.5 ml 32.06 ml/m  AORTIC VALVE AV Area (Vmax):    1.61 cm AV Area (Vmean):   1.55 cm AV Area (VTI):     1.66 cm AV Vmax:           329.63 cm/s AV Vmean:          240.676 cm/s AV VTI:            0.720 m AV Peak Grad:      43.5 mmHg AV Mean Grad:      23.8 mmHg LVOT Vmax:         153.00 cm/s LVOT Vmean:        108.000 cm/s LVOT VTI:          0.345 m LVOT/AV VTI ratio: 0.48  AORTA Ao Root diam: 3.00 cm Ao Asc diam:  4.10 cm MITRAL VALVE MV Area (PHT): 2.43 cm     SHUNTS MV Decel Time: 312 msec     Systemic VTI:  0.34 m MV E velocity: 71.80 cm/s   Systemic Diam: 2.10 cm MV A velocity: 110.00 cm/s MV E/A ratio:  0.65 Weston Brass MD Electronically  signed by Cherlynn Kaiser MD Signature Date/Time: 08/25/2021/10:43:47 AM    Final    DG ESOPHAGUS W SINGLE CM (SOL OR THIN BA)  Result Date: 08/25/2021 CLINICAL DATA:  Chest discomfort. Dysphagia to solid foods. History of gastroesophageal reflux. EXAM: ESOPHAGUS/BARIUM SWALLOW/TABLET STUDY TECHNIQUE: Single contrast examination was performed using thin liquid barium. This exam was performed by Ascencion Dike PA-C, and was supervised and interpreted by Dr. Macy Mis. FLUOROSCOPY: Radiation Exposure Index (as provided by the fluoroscopic device): 25.50 mGy Kerma COMPARISON:  None Available. FINDINGS: Swallowing: Limited evaluation but appears normal. No vestibular penetration or aspiration seen. Pharynx: Unremarkable. Esophagus: No gross mucosal lesions on this single contrast study. Slightly asymmetric circumferential filling defect of the distal esophagus just above the gastroesophageal junction. Contrast passes through this area freely. Esophageal motility: No significant dysmotility for age. Hiatal Hernia: Small hiatal hernia is present. Gastroesophageal reflux: Spontaneous reflux noted, significantly  exacerbated with Valsalva maneuver. Ingested 13 mm barium tablet: Mildly delayed passage through the area of narrowing and hiatal hernia into remainder of stomach. IMPRESSION: Narrowing of the esophagus just above a small hiatal hernia may represent a Schatzki's ring. There is only mildly delayed passage of 13 mm barium tablet through this area and no obstruction to contrast. Significant gastroesophageal reflux appreciated both spontaneously and with provocative maneuvers. Read by: Ascencion Dike PA-C Electronically Signed   By: Macy Mis M.D.   On: 08/25/2021 16:36    Cardiac Studies   Echo 08/25/2021 1. Left ventricular ejection fraction, by estimation, is 65 to 70%. The  left ventricle has normal function. The left ventricle has no regional  wall motion abnormalities. There is mild left ventricular hypertrophy.  Left ventricular diastolic parameters  are consistent with Grade I diastolic dysfunction (impaired relaxation).   2. Right ventricular systolic function is normal. The right ventricular  size is normal. Tricuspid regurgitation signal is inadequate for assessing  PA pressure.   3. Right atrial size was mildly dilated.   4. The mitral valve is grossly normal. Trivial mitral valve  regurgitation. No evidence of mitral stenosis.   5. The aortic valve is abnormal. There is severe calcifcation of the  aortic valve. Aortic valve regurgitation is trivial. Moderate aortic valve  stenosis. Aortic valve mean gradient measures 23.8 mmHg. Aortic valve Vmax  measures 3.30 m/s.   6. Aortic dilatation noted. There is mild dilatation of the ascending  aorta, measuring 41 mm.   7. The inferior vena cava is normal in size with greater than 50%  respiratory variability, suggesting right atrial pressure of 3 mmHg.   8. Increased flow velocities may be secondary to anemia, thyrotoxicosis,  hyperdynamic or high flow state.   Comparison(s): A prior study was performed on 01/10/21. Changes from prior   study are noted. Aortic valve systolic gradient has increased.  Patient Profile     76 y.o. female with a hx of hypertension, hyperlipidemia, chronic pain syndrome, CAD, history of sinus pause on nebivolol (able to tolerate low dose metoprolol), obstructive sleep apnea not on CPAP and multiple drug allergies who is being seen 08/25/2021 for the evaluation of chest pain at the request of Dr. Eliseo Squires.  Assessment & Plan    Chest pain: -atypical for cardiac, suspect GI etiology -hsTn negative despite prolonged pain -normal EF on echo  Paroxysmal atrial flutter -CHA2DS2/VAS Stroke Risk Points=5  -she is currently on heparin drip. Unclear if invasive GI procedures planned. Barium swallow with possible Schatzki's ring -if EGD planned, restart apixaban post procedure when  clear from GI perspective -tolerates metoprolol for rate control  Hypertension -reasonable control, with occasional low. Suspect elevations driven by pain -continue hydralazine, isordil, losartan, metoprolol, spironolactone  We will follow peripherally over the weekend, please contact with any questions.  For questions or updates, please contact [place] Please consult [email or web address] for contact info under        Signed, Buford Dresser, MD  08/26/2021, 9:10 AM

## 2021-08-26 NOTE — Assessment & Plan Note (Signed)
-   Continue hydralazine, Isordil, losartan, Lopressor

## 2021-08-26 NOTE — Progress Notes (Signed)
Jennifer [name] 11:03 AM  Subjective: Patient seen and we reviewed her history and she has no new complaints and she is currently on heparin and we discussed her reflux her atypical chest pain and her GI history and her swallowing  Objective: Vital signs stable afebrile no acute distress patient not examined today will be tomorrow prior to her procedure labs and previous work-up reviewed barium swallow reviewed  Assessment: Atypical chest pain dysphagia abnormal barium swallow  Plan: The risk benefits methods of endoscopy and possible dilation were discussed with the patient and will proceed tomorrow morning and please hold heparin at 4 AM as ordered tomorrow with further work-up and plans pending those findings  [facility]  office [phone] After 5PM or if no answer call [phone]

## 2021-08-26 NOTE — Assessment & Plan Note (Signed)
-   Eliquis resumed prior to discharge - continued on metoprolol

## 2021-08-26 NOTE — Hospital Course (Signed)
Jennifer [name] is a 76 year old female with PMH GERD, fatigue syndrome, fibromyalgia, hiatal hernia, HTN, interstitial cystitis, OSA, lumbar spinal stenosis who presented with chest pain.  Initial concern on admission was for cardiac etiology and she underwent further work-up along with cardiology evaluation.  She was also initially started on nitroglycerin drip.  Her symptoms were considered to be noncardiac in etiology and she was weaned off of nitro drip.  Troponins remained negative with trending and echo obtained showed no wall motion abnormalities.  GI was then consulted and she underwent barium swallow study which showed esophageal narrowing just above small hiatal hernia with concern for possible Schatzki's ring.  She underwent EGD on 08/27/2021 with no stricture or rings noted.  Multiple gastric polyps biopsied however.

## 2021-08-26 NOTE — Assessment & Plan Note (Signed)
-   Possibly pain and anxiety related on admission along with some difficulty swallowing pills - Blood pressure has now become controlled - Continue hydralazine, Isordil, losartan, Lopressor

## 2021-08-26 NOTE — Assessment & Plan Note (Addendum)
-   Considered noncardiac in etiology and has been evaluated by cardiology - Echo performed on admission shows EF 65 to 70%, normal LV function, no RWMA, mild LVH, grade 1 diastolic dysfunction - Troponins trended on admission and were negative x4 - suspicious for hiatal hernia contributing to etiology given negative workup; patient to discuss with PCP outpatient regarding possible surgery referral

## 2021-08-27 ENCOUNTER — Encounter (HOSPITAL_COMMUNITY)
Admission: EM | Disposition: A | Discharge status: Home / Self Care | Payer: Self-pay | Source: Home / Self Care | Attending: Internal Medicine

## 2021-08-27 ENCOUNTER — Inpatient Hospital Stay (HOSPITAL_COMMUNITY): Payer: Medicare Other | Admitting: Certified Registered"

## 2021-08-27 ENCOUNTER — Encounter (HOSPITAL_COMMUNITY): Payer: Self-pay | Admitting: Internal Medicine

## 2021-08-27 DIAGNOSIS — I25119 Atherosclerotic heart disease of native coronary artery with unspecified angina pectoris: Secondary | ICD-10-CM

## 2021-08-27 DIAGNOSIS — I4892 Unspecified atrial flutter: Secondary | ICD-10-CM | POA: Diagnosis not present

## 2021-08-27 DIAGNOSIS — R933 Abnormal findings on diagnostic imaging of other parts of digestive tract: Secondary | ICD-10-CM

## 2021-08-27 DIAGNOSIS — I10 Essential (primary) hypertension: Secondary | ICD-10-CM | POA: Diagnosis not present

## 2021-08-27 DIAGNOSIS — K317 Polyp of stomach and duodenum: Secondary | ICD-10-CM | POA: Diagnosis not present

## 2021-08-27 DIAGNOSIS — K449 Diaphragmatic hernia without obstruction or gangrene: Secondary | ICD-10-CM

## 2021-08-27 HISTORY — PX: ESOPHAGOGASTRODUODENOSCOPY (EGD) WITH PROPOFOL: SHX5813

## 2021-08-27 HISTORY — PX: BIOPSY: SHX5522

## 2021-08-27 LAB — CBC
HCT: 31.7 % — ABNORMAL LOW (ref 36.0–46.0)
Hemoglobin: 9.6 g/dL — ABNORMAL LOW (ref 12.0–15.0)
MCH: 25.7 pg — ABNORMAL LOW (ref 26.0–34.0)
MCHC: 30.3 g/dL (ref 30.0–36.0)
MCV: 85 fL (ref 80.0–100.0)
Platelets: 257 10*3/uL (ref 150–400)
RBC: 3.73 MIL/uL — ABNORMAL LOW (ref 3.87–5.11)
RDW: 15 % (ref 11.5–15.5)
WBC: 6.3 10*3/uL (ref 4.0–10.5)
nRBC: 0 % (ref 0.0–0.2)

## 2021-08-27 LAB — BASIC METABOLIC PANEL
Anion gap: 5 (ref 5–15)
BUN: 7 mg/dL — ABNORMAL LOW (ref 8–23)
CO2: 27 mmol/L (ref 22–32)
Calcium: 8.7 mg/dL — ABNORMAL LOW (ref 8.9–10.3)
Chloride: 107 mmol/L (ref 98–111)
Creatinine, Ser: 0.95 mg/dL (ref 0.44–1.00)
GFR, Estimated: >60 mL/min (ref >60)
Glucose, Bld: 95 mg/dL (ref 70–99)
Potassium: 4.2 mmol/L (ref 3.5–5.1)
Sodium: 139 mmol/L (ref 135–145)

## 2021-08-27 LAB — APTT: aPTT: 144 seconds — ABNORMAL HIGH (ref 24–36)

## 2021-08-27 LAB — MAGNESIUM: Magnesium: 2.2 mg/dL (ref 1.7–2.4)

## 2021-08-27 LAB — HEPARIN LEVEL (UNFRACTIONATED): Heparin Unfractionated: >1.1 IU/mL — ABNORMAL HIGH (ref 0.30–0.70)

## 2021-08-27 SURGERY — ESOPHAGOGASTRODUODENOSCOPY (EGD) WITH PROPOFOL
Anesthesia: Monitor Anesthesia Care

## 2021-08-27 MED ORDER — HEPARIN (PORCINE) 25000 UT/250ML-% IV SOLN: 1200.0000 [IU]/h | INTRAVENOUS | Status: DC

## 2021-08-27 MED ORDER — APIXABAN 5 MG PO TABS
5.0000 mg | ORAL_TABLET | Freq: Two times a day (BID) | ORAL | Status: DC
  Administered 2021-08-28: 5 mg via ORAL
  Filled 2021-08-27: qty 1

## 2021-08-27 MED ORDER — LACTATED RINGERS IV SOLN: INTRAVENOUS | Status: DC | PRN

## 2021-08-27 MED ORDER — HEPARIN (PORCINE) 25000 UT/250ML-% IV SOLN
1100.0000 [IU]/h | INTRAVENOUS | Status: AC
  Administered 2021-08-27: 1000 [IU]/h via INTRAVENOUS
  Administered 2021-08-28: 1100 [IU]/h via INTRAVENOUS
  Filled 2021-08-27: qty 250

## 2021-08-27 MED ORDER — PROPOFOL 500 MG/50ML IV EMUL
INTRAVENOUS | Status: DC | PRN
  Administered 2021-08-27: 100 ug/kg/min via INTRAVENOUS

## 2021-08-27 MED ORDER — PANTOPRAZOLE SODIUM 40 MG PO TBEC
40.0000 mg | DELAYED_RELEASE_TABLET | Freq: Two times a day (BID) | ORAL | Status: DC
  Administered 2021-08-27 – 2021-08-28 (×2): 40 mg via ORAL
  Filled 2021-08-27 (×3): qty 1

## 2021-08-27 SURGICAL SUPPLY — 15 items

## 2021-08-27 NOTE — Anesthesia Postprocedure Evaluation (Signed)
Anesthesia Post Note  Patient: Jennifer [name]  Procedure(s) Performed: ESOPHAGOGASTRODUODENOSCOPY (EGD) WITH PROPOFOL BIOPSY     Patient location during evaluation: Endoscopy Anesthesia Type: MAC Level of consciousness: awake and alert Pain management: pain level controlled Vital Signs Assessment: post-procedure vital signs reviewed and stable Respiratory status: spontaneous breathing, nonlabored ventilation, respiratory function stable and patient connected to nasal cannula oxygen Cardiovascular status: stable and blood pressure returned to baseline Postop Assessment: no apparent nausea or vomiting Anesthetic complications: no   No notable events documented.  Last Vitals:  Vitals:   08/27/21 1132 08/27/21 1621  BP: (!) 153/57 135/63  Pulse: 63   Resp: 18   Temp: 36.7 C   SpO2: 100%     Last Pain:  Vitals:   08/27/21 1621  TempSrc:   PainSc: [place]

## 2021-08-27 NOTE — Op Note (Signed)
[facility] Patient Name: Jennifer [name] Procedure Date : 08/27/2021 MRN: [id number] Attending MD: Clarene Essex , MD Date of Birth: 06/22/1945 CSN: [id number] Age: 76 Admit Type: Inpatient Procedure:                Upper GI endoscopy Indications:              Dysphagia, Abnormal cine-esophagram, Chest pain                            (non cardiac) Providers:                Clarene Essex, MD, Jeanella Cara, RN, Cletis Athens, Technician, Mardene Celeste "Trish" Durene Romans, CRNA Referring MD:              Medicines:                Propofol per Anesthesia Complications:            No immediate complications. Estimated Blood Loss:     Estimated blood loss was minimal. Procedure:                Pre-Anesthesia Assessment:                           - Prior to the procedure, a History and Physical                            was performed, and patient medications and                            allergies were reviewed. The patient's tolerance of                            previous anesthesia was also reviewed. The risks                            and benefits of the procedure and the sedation                            options and risks were discussed with the patient.                            All questions were answered, and informed consent                            was obtained. Prior Anticoagulants: The patient has                            taken heparin, last dose was day of procedure. ASA                            Grade Assessment: III - A patient with severe  systemic disease. After reviewing the risks and                            benefits, the patient was deemed in satisfactory                            condition to undergo the procedure.                           After obtaining informed consent, the endoscope was                            passed under direct vision. Throughout the                            procedure,  the patient's blood pressure, pulse, and                            oxygen saturations were monitored continuously. The                            [id number] Olympus endoscope was introduced                            through the mouth, and advanced to the third part                            of duodenum. The upper GI endoscopy was                            accomplished without difficulty. The patient                            tolerated the procedure well. Scope In: Scope Out: Findings:      A small hiatal hernia was present. Otherwise the esophagus was normal       without ring or stricture      Multiple small semi-sessile polyps were found in the cardia and in the       gastric body. Biopsies were taken with a cold forceps for histology.      The duodenal bulb, first portion of the duodenum, second portion of the       duodenum and third portion of the duodenum were normal.      The exam was otherwise without abnormality. Impression:               - Small hiatal hernia. Otherwise normal esophagus                            without ring or stricture                           - Multiple gastric polyps. Biopsied.                           - Normal duodenal bulb, first portion of the  duodenum, second portion of the duodenum and third                            portion of the duodenum.                           - The examination was otherwise normal. Recommendation:           - Soft diet today. Okay with me to go home                           - Continue present medications. Increase pump                            inhibitor to 40 mg twice a day and either use                            prescription omeprazole or change to pantoprazole                            if okay with questionable yellow dye allergy and                            use it 40 mg twice a day as well                           - Resume Eliquis (apixaban) at prior dose tomorrow.                             Okay with me to resume heparin in 6 hours but she                            did have some increased bleeding from biopsy so we                            will leave blood thinner choices to cardiology                           - Await pathology results.                           - Return to GI clinic in 4 weeks. And please call                            [place] if we can be of any further assistance with this                            hospital stay                           - Telephone GI clinic for pathology results in 1  week. Procedure Code(s):        --- Professional ---                           [phone], Esophagogastroduodenoscopy, flexible,                            transoral; with biopsy, single or multiple Diagnosis Code(s):        --- Professional ---                           K44.9, Diaphragmatic hernia without obstruction or                            gangrene                           K31.7, Polyp of stomach and duodenum                           R13.10, Dysphagia, unspecified                           R07.89, Other chest pain                           R93.3, Abnormal findings on diagnostic imaging of                            other parts of digestive tract CPT copyright 2019 [employer]. All rights reserved. The codes documented in this report are preliminary and upon coder review may  be revised to meet current compliance requirements. Clarene Essex, MD 08/27/2021 11:01:43 AM This report has been signed electronically. Number of Addenda: 0

## 2021-08-27 NOTE — Anesthesia Preprocedure Evaluation (Signed)
Anesthesia Evaluation  Patient identified by MRN, date of birth, ID band Patient awake    Reviewed: Allergy & Precautions, NPO status , Patient's Chart, lab work & pertinent test results, reviewed documented beta blocker date and time   History of Anesthesia Complications (+) PONV and history of anesthetic complications  Airway Mallampati: II  TM Distance: >3 FB Neck ROM: Full    Dental  (+) Dental Advisory Given, Missing, Partial Upper, Partial Lower   Pulmonary sleep apnea ,    Pulmonary exam normal breath sounds clear to auscultation       Cardiovascular hypertension, Pt. on medications and Pt. on home beta blockers + angina + CAD and +CHF  + dysrhythmias + Valvular Problems/Murmurs AS  Rhythm:Regular Rate:Normal + Systolic murmurs    Neuro/Psych  Headaches, PSYCHIATRIC DISORDERS Anxiety    GI/Hepatic Neg liver ROS, hiatal hernia, GERD  Medicated,Atypical chest pain dysphagia abnormal barium swallow   Endo/Other  Hypothyroidism Obesity   Renal/GU negative Renal ROS Bladder dysfunction  IC    Musculoskeletal  (+) Arthritis , Fibromyalgia -  Abdominal   Peds  Hematology  (+) Blood dyscrasia (Eliquis), ,   Anesthesia Other Findings Day of surgery medications reviewed with the patient.  Reproductive/Obstetrics                             Anesthesia Physical Anesthesia Plan  ASA: 3  Anesthesia Plan: MAC   Post-op Pain Management: Minimal or no pain anticipated   Induction: Intravenous  PONV Risk Score and Plan: 3 and TIVA and Treatment may vary due to age or medical condition  Airway Management Planned: Nasal Cannula and Natural Airway  Additional Equipment:   Intra-op Plan:   Post-operative Plan:   Informed Consent: I have reviewed the patients History and Physical, chart, labs and discussed the procedure including the risks, benefits and alternatives for the proposed  anesthesia with the patient or authorized representative who has indicated his/her understanding and acceptance.     Dental advisory given  Plan Discussed with: CRNA and Anesthesiologist  Anesthesia Plan Comments:         Anesthesia Quick Evaluation

## 2021-08-27 NOTE — Assessment & Plan Note (Signed)
-   Multiple gastric polyps biopsied during EGD performed on 08/27/2021 - Follow-up pathology

## 2021-08-27 NOTE — Progress Notes (Signed)
Progress Note    Jennifer [name]   [id number]  DOB: 1945/06/29  DOA: 08/24/2021     2 PCP: Jennifer Stalker, PA-C  Initial CC: Chest pain  Hospital Course: [name] is a 76 year old female with PMH GERD, fatigue syndrome, fibromyalgia, hiatal hernia, HTN, interstitial cystitis, OSA, lumbar spinal stenosis who presented with chest pain.  Initial concern on admission was for cardiac etiology and she underwent further work-up along with cardiology evaluation.  She was also initially started on nitroglycerin drip.  Her symptoms were considered to be noncardiac in etiology and she was weaned off of nitro drip.  Troponins remained negative with trending and echo obtained showed no wall motion abnormalities.  GI was then consulted and she underwent barium swallow study which showed esophageal narrowing just above small hiatal hernia with concern for possible Schatzki's ring.  She underwent EGD on 08/27/2021 with no stricture or rings noted.  Multiple gastric polyps removed however.  Interval History:  Seen this morning prior to EGD.  Pain seems to be improved compared to admission.  Assessment and Plan: Chest pain-resolved as of 08/27/2021 - Considered noncardiac in etiology and has been evaluated by cardiology - Echo performed on admission shows EF 65 to 70%, normal LV function, no RWMA, mild LVH, grade 1 diastolic dysfunction - Troponins trended on admission and were negative x4  Atrial flutter ([place]) - Eliquis on hold; to be resumed 08/29/2018 3 in the morning -Resume heparin drip 6 hours after EGD -Continue metoprolol  CAD (coronary artery disease) - Last heart cath performed 09/16/2020 showing single-vessel CAD involving LAD however was very tortuous and not amenable to intervention.  She is medically managed  HTN (hypertension) - Continue hydralazine, Isordil, losartan, Lopressor  Gastric polyps - Multiple gastric polyps biopsied during EGD performed on 08/27/2021 - Follow-up  pathology  Chronic pain syndrome - Continue Norco and gabapentin  Hypertensive urgency-resolved as of 08/26/2021 - Possibly pain and anxiety related on admission along with some difficulty swallowing pills - Blood pressure has now become controlled - Continue hydralazine, Isordil, losartan, Lopressor   Old records reviewed in assessment of this patient  Antimicrobials:   DVT prophylaxis:    Heparin drip apixaban (ELIQUIS) tablet 5 mg   Code Status:   Code Status: Full Code  Disposition Plan: Home Monday Status is: Inpatient  Objective: Blood pressure (!) 153/57, pulse 63, temperature 98.1 F (36.7 C), temperature source Oral, resp. rate 18, height 5\' 4"  (1.626 m), weight 86.9 kg, SpO2 100 %.  Examination:  Physical Exam Constitutional:      General: She is not in acute distress.    Appearance: Normal appearance.  HENT:     Head: Normocephalic and atraumatic.     Mouth/Throat:     Mouth: Mucous membranes are moist.  Eyes:     Extraocular Movements: Extraocular movements intact.  Cardiovascular:     Rate and Rhythm: Normal rate and regular rhythm.     Heart sounds: Murmur heard.     Comments: 3/6 HSM appreciated in upper sternal borders Pulmonary:     Effort: Pulmonary effort is normal.     Breath sounds: Normal breath sounds.  Abdominal:     General: Bowel sounds are normal. There is no distension.     Palpations: Abdomen is soft.     Tenderness: There is no abdominal tenderness.  Musculoskeletal:        General: Normal range of motion.     Cervical back: Normal range of motion and neck  supple.  Skin:    General: Skin is warm and dry.  Neurological:     General: No focal deficit present.     Mental Status: She is alert.  Psychiatric:        Mood and Affect: Mood normal.        Behavior: Behavior normal.     Consultants:  Cardiology GI  Procedures:  EGD, 08/27/2021  Data Reviewed: Results for orders placed or performed during the hospital encounter  of 08/24/21 (from the past 24 hour(s))  APTT     Status: Abnormal   Collection Time: 08/26/21  1:23 PM  Result Value Ref Range   aPTT 73 (H) 24 - 36 seconds  APTT     Status: Abnormal   Collection Time: 08/27/21  3:18 AM  Result Value Ref Range   aPTT 144 (H) 24 - 36 seconds  Heparin level (unfractionated)     Status: Abnormal   Collection Time: 08/27/21  3:18 AM  Result Value Ref Range   Heparin Unfractionated >1.10 (H) 0.30 - 0.70 IU/mL  CBC     Status: Abnormal   Collection Time: 08/27/21  3:18 AM  Result Value Ref Range   WBC 6.3 4.0 - 10.5 K/uL   RBC 3.73 (L) 3.87 - 5.11 MIL/uL   Hemoglobin 9.6 (L) 12.0 - 15.0 g/dL   HCT 31.7 (L) 36.0 - 46.0 %   MCV 85.0 80.0 - 100.0 fL   MCH 25.7 (L) 26.0 - 34.0 pg   MCHC 30.3 30.0 - 36.0 g/dL   RDW 15.0 11.5 - 15.5 %   Platelets 257 150 - 400 K/uL   nRBC 0.0 0.0 - 0.2 %  Basic metabolic panel     Status: Abnormal   Collection Time: 08/27/21  3:18 AM  Result Value Ref Range   Sodium 139 135 - 145 mmol/L   Potassium 4.2 3.5 - 5.1 mmol/L   Chloride 107 98 - 111 mmol/L   CO2 27 22 - 32 mmol/L   Glucose, Bld 95 70 - 99 mg/dL   BUN 7 (L) 8 - 23 mg/dL   Creatinine, Ser 0.95 0.44 - 1.00 mg/dL   Calcium 8.7 (L) 8.9 - 10.3 mg/dL   GFR, Estimated >60 >60 mL/min   Anion gap 5 5 - 15  Magnesium     Status: None   Collection Time: 08/27/21  3:18 AM  Result Value Ref Range   Magnesium 2.2 1.7 - 2.4 mg/dL    I have Reviewed nursing notes, Vitals, and Lab results since pt's last encounter. Pertinent lab results : see above I have ordered test including BMP, CBC, Mg I have reviewed the last note from staff over past 24 hours I have discussed pt's care plan and test results with nursing staff, case manager   LOS: 2 days   Dwyane Dee, MD [facility] 08/27/2021, 12:06 PM

## 2021-08-27 NOTE — Transfer of Care (Signed)
Immediate Anesthesia Transfer of Care Note  Patient: PHALA SCHRAEDER  Procedure(s) Performed: ESOPHAGOGASTRODUODENOSCOPY (EGD) WITH PROPOFOL BIOPSY  Patient Location: PACU  Anesthesia Type:MAC  Level of Consciousness: awake, alert , oriented and patient cooperative  Airway & Oxygen Therapy: Patient Spontanous Breathing  Post-op Assessment: Report given to RN and Post -op Vital signs reviewed and stable  Post vital signs: Reviewed and stable  Last Vitals:  Vitals Value Taken Time  BP 122/53 08/27/21 1056  Temp 36.4 C 08/27/21 1056  Pulse 64 08/27/21 1057  Resp 14 08/27/21 1057  SpO2 94 % 08/27/21 1057  Vitals shown include unvalidated device data.  Last Pain:  Vitals:   08/27/21 1056  TempSrc: Temporal  PainSc: 0-No pain      Patients Stated Pain Goal: 0 (60/02/98 4730)  Complications: No notable events documented.

## 2021-08-27 NOTE — Progress Notes (Signed)
Savi Lastinger Castanon 10:25 AM  Subjective: Patient without any new complaints and we rediscussed her procedure and her heparin has been turned off  Objective: Vital signs stable afebrile no acute distress exam please see preassessment evaluation labs stable  Assessment: Atypical chest pain dysphagia abnormal barium swallow  Plan: Okay to proceed with endoscopy and possible dilation with anesthesia assistance  [facility]  office [phone] After 5PM or if no answer call [phone]

## 2021-08-27 NOTE — Progress Notes (Addendum)
ANTICOAGULATION CONSULT NOTE - Follow-up Consult  Pharmacy Consult for Heparin (Apixaban on hold) Indication: atrial fibrillation  Allergies  Allergen Reactions   Moxifloxacin Hives, Shortness Of Breath, Swelling and Rash    Rash was severe, caused tremors, ans skin became BLOODY RED   Quinolones Anaphylaxis   Sulfa Antibiotics Hives and Rash   Furosemide Other (See Comments)    CANNOT TAKE DUE TO Interstitial cystitis   Aspirin Nausea Only and Other (See Comments)    GI and stomach upset   Doxazosin     constipation   Duloxetine Hcl Nausea Only and Other (See Comments)    Vertigo and heart racing also    Hydrochlorothiazide Other (See Comments)    Headaches    Ibuprofen Nausea Only and Other (See Comments)    GI upset   Nebivolol Hcl     Other reaction(s): extreme fatigue   Other Other (See Comments)   Ranexa [Ranolazine]     Constipation/heart pounding   Robaxin [Methocarbamol] Other (See Comments)    "Make me feel flu-like symptoms"   Topiramate Itching   Xarelto [Rivaroxaban]     Back pain   Zofran [Ondansetron Hcl] Other (See Comments)    Headaches    Codeine Hives, Nausea And Vomiting and Rash   Dimethyl Sulfoxide Hives and Itching   Macrodantin [Nitrofurantoin Macrocrystal] Itching, Nausea And Vomiting and Rash    Abdominal and chest pain, also- blood-red skin, also   Nitrofurantoin Diarrhea, Itching, Rash and Other (See Comments)    GI/stomach upset, sweating, chest pain, and skin turned bloody red   Tape Rash   Yellow Dye Rash    Patient Measurements: Height: 5\' 4"  (162.6 cm) Weight: 86.9 kg (191 lb 9.3 oz) IBW/kg (Calculated) : 54.7 Heparin Dosing Weight: 74 kg  Vital Signs: Temp: 98.1 F (36.7 C) (05/28 1132) Temp Source: Oral (05/28 1132) BP: 153/57 (05/28 1132) Pulse Rate: 63 (05/28 1132)  Labs: Recent Labs    08/24/21 2129 08/24/21 2310 08/25/21 0247 08/25/21 0606 08/26/21 0436 08/26/21 1323 08/27/21 0318  HGB 10.0*  --   --   --   10.0*  --  9.6*  HCT 32.2*  --   --   --  31.8*  --  31.7*  PLT 280  --   --   --  265  --  257  APTT  --   --   --   --  65* 73* 144*  HEPARINUNFRC  --   --   --   --  >1.10*  --  >1.10*  CREATININE 0.92  --   --   --   --   --  0.95  TROPONINIHS 13 13 13 11   --   --   --      Estimated Creatinine Clearance: 56.3 mL/min (by C-G formula based on SCr of 0.95 mg/dL).   Medical History: Past Medical History:  Diagnosis Date   Acute meniscal tear of left knee    FOLLOWED BY DR GIAFFREY   Atrial flutter ([facility])    AV block, Mobitz 1    BPPV (benign paroxysmal positional vertigo)    Chronic bladder pain    Chronic fatigue syndrome    Chronic low back pain    W/ RIGHT LEG PAIN AND NUMBNESS   Cystitis, chronic    Fibromyalgia    GERD (gastroesophageal reflux disease)    Hiatal hernia    Hypertension    IC (interstitial cystitis)    OSA (obstructive sleep apnea)  per pt study yrs ago-- moderate osa ,  intolerant cpap   Pinched vertebral nerve    bilateral L2 -- L3 and L3 -- L4-/  epidural injection's treatment , PT and pain clince   PONV (postoperative nausea and vomiting)    severe   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 Last dose of Apixaban given 5/26 at 0937.  Admitted 5/25 pm with chest pain, noted somewhat atypical.   Will monitor heparin with aPTTs, while heparin levels are expected to be falsely high due to recent Apixaban doses.  APTT supratherapeutic at 144 this morning, CBC WNL, no bleeding noted. Heparin has been held since 4 am for EGD. EGD is complete noting significant reflux, narrowing of esophagus, and hiatal hernia. Pharmacy has been  consulted to resume heparin 6 hours after EGD and resume Eliquis in the morning.  Goal of Therapy:  Heparin level 0.3-0.7 units/ml aPTT 66-102 seconds Monitor platelets by anticoagulation protocol: Yes   Plan:  Decrease heparin to 1000 units/hr  Stop heparin when Eliquis is started Resume Eliquis in the morning Final aPTT/CBC at 0200 before transition to Eliquis  Thank you for allowing pharmacy to participate in this patient's care.  Enos Fling, PharmD PGY1 Pharmacy Resident 08/27/2021 11:34 AM Check [email or web address] for unit specific pharmacy number

## 2021-08-27 NOTE — Progress Notes (Signed)
ANTICOAGULATION CONSULT NOTE - Follow-up Consult  Pharmacy Consult for Heparin (Apixaban on hold) Indication: atrial fibrillation  Allergies  Allergen Reactions   Moxifloxacin Hives, Shortness Of Breath, Swelling and Rash    Rash was severe, caused tremors, ans skin became BLOODY RED   Quinolones Anaphylaxis   Sulfa Antibiotics Hives and Rash   Furosemide Other (See Comments)    CANNOT TAKE DUE TO Interstitial cystitis   Aspirin Nausea Only and Other (See Comments)    GI and stomach upset   Doxazosin     constipation   Duloxetine Hcl Nausea Only and Other (See Comments)    Vertigo and heart racing also    Hydrochlorothiazide Other (See Comments)    Headaches    Ibuprofen Nausea Only and Other (See Comments)    GI upset   Nebivolol Hcl     Other reaction(s): extreme fatigue   Other Other (See Comments)   Ranexa [Ranolazine]     Constipation/heart pounding   Robaxin [Methocarbamol] Other (See Comments)    "Make me feel flu-like symptoms"   Topiramate Itching   Xarelto [Rivaroxaban]     Back pain   Zofran [Ondansetron Hcl] Other (See Comments)    Headaches    Codeine Hives, Nausea And Vomiting and Rash   Dimethyl Sulfoxide Hives and Itching   Macrodantin [Nitrofurantoin Macrocrystal] Itching, Nausea And Vomiting and Rash    Abdominal and chest pain, also- blood-red skin, also   Nitrofurantoin Diarrhea, Itching, Rash and Other (See Comments)    GI/stomach upset, sweating, chest pain, and skin turned bloody red   Tape Rash   Yellow Dye Rash    Patient Measurements: Height: 5\' 4"  (162.6 cm) Weight: 86.9 kg (191 lb 9.3 oz) IBW/kg (Calculated) : 54.7 Heparin Dosing Weight: 74 kg  Vital Signs: Temp: 97.7 F (36.5 C) (05/28 0423) Temp Source: Oral (05/28 0423) BP: 136/61 (05/28 0423) Pulse Rate: 59 (05/28 0423)  Labs: Recent Labs    08/24/21 2129 08/24/21 2310 08/25/21 0247 08/25/21 0606 08/26/21 0436 08/26/21 1323 08/27/21 0318  HGB 10.0*  --   --   --   10.0*  --  9.6*  HCT 32.2*  --   --   --  31.8*  --  31.7*  PLT 280  --   --   --  265  --  257  APTT  --   --   --   --  65* 73* 144*  HEPARINUNFRC  --   --   --   --  >1.10*  --  >1.10*  CREATININE 0.92  --   --   --   --   --  0.95  TROPONINIHS 13 13 13 11   --   --   --      Estimated Creatinine Clearance: 56.3 mL/min (by C-G formula based on SCr of 0.95 mg/dL).   Medical History: Past Medical History:  Diagnosis Date   Acute meniscal tear of left knee    FOLLOWED BY DR GIAFFREY   Atrial flutter ([facility])    AV block, Mobitz 1    BPPV (benign paroxysmal positional vertigo)    Chronic bladder pain    Chronic fatigue syndrome    Chronic low back pain    W/ RIGHT LEG PAIN AND NUMBNESS   Cystitis, chronic    Fibromyalgia    GERD (gastroesophageal reflux disease)    Hiatal hernia    Hypertension    IC (interstitial cystitis)    OSA (obstructive sleep apnea)  per pt study yrs ago-- moderate osa ,  intolerant cpap   Pinched vertebral nerve    bilateral L2 -- L3 and L3 -- L4-/  epidural injection's treatment , PT and pain clince   PONV (postoperative nausea and vomiting)    severe   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 Last dose of Apixaban given 5/26 at 0937.  Admitted 5/25 pm with chest pain, noted somewhat atypical.   Will monitor heparin with aPTTs, while heparin levels are expected to be falsely high due to recent Apixaban doses.  APTT supratherapeutic at 144 this morning, CBC WNL, no bleeding noted. Heparin has been held since 4 am for EGD. I spoke with the patient and determined that labs were drawn in the opposite arm of heparin infusion. When  heparin is resumed after procedure, will recommend dose reduction. Will continue to follow plans for intervention and will likely resume Eliquis after the procedure.  Goal of Therapy:  Heparin level 0.3-0.7 units/ml aPTT 66-102 seconds Monitor platelets by anticoagulation protocol: Yes   Plan:  Decrease heparin to 1000 units/hr if resumed Resume Apixban when able Daily aPTT, Xa, and CBC  Thank you for allowing pharmacy to participate in this patient's care.  Enos Fling, PharmD PGY1 Pharmacy Resident 08/27/2021 7:02 AM Check [email or web address] for unit specific pharmacy number

## 2021-08-28 DIAGNOSIS — R079 Chest pain, unspecified: Secondary | ICD-10-CM | POA: Diagnosis not present

## 2021-08-28 DIAGNOSIS — I484 Atypical atrial flutter: Secondary | ICD-10-CM

## 2021-08-28 DIAGNOSIS — I25119 Atherosclerotic heart disease of native coronary artery with unspecified angina pectoris: Secondary | ICD-10-CM | POA: Diagnosis not present

## 2021-08-28 DIAGNOSIS — K449 Diaphragmatic hernia without obstruction or gangrene: Secondary | ICD-10-CM

## 2021-08-28 DIAGNOSIS — K317 Polyp of stomach and duodenum: Secondary | ICD-10-CM | POA: Diagnosis not present

## 2021-08-28 LAB — CBC
HCT: 29.4 % — ABNORMAL LOW (ref 36.0–46.0)
Hemoglobin: 9.2 g/dL — ABNORMAL LOW (ref 12.0–15.0)
MCH: 26.5 pg (ref 26.0–34.0)
MCHC: 31.3 g/dL (ref 30.0–36.0)
MCV: 84.7 fL (ref 80.0–100.0)
Platelets: 249 10*3/uL (ref 150–400)
RBC: 3.47 MIL/uL — ABNORMAL LOW (ref 3.87–5.11)
RDW: 14.9 % (ref 11.5–15.5)
WBC: 5.3 10*3/uL (ref 4.0–10.5)
nRBC: 0 % (ref 0.0–0.2)

## 2021-08-28 LAB — APTT: aPTT: 55 seconds — ABNORMAL HIGH (ref 24–36)

## 2021-08-28 MED ORDER — SUCRALFATE 1 G PO TABS: 1.0000 g | ORAL_TABLET | Freq: Three times a day (TID) | ORAL | 3 refills | Status: AC

## 2021-08-28 MED ORDER — PANTOPRAZOLE SODIUM 40 MG PO TBEC: 40.0000 mg | DELAYED_RELEASE_TABLET | Freq: Two times a day (BID) | ORAL | 3 refills | Status: DC

## 2021-08-28 NOTE — Progress Notes (Signed)
ANTICOAGULATION CONSULT NOTE - Follow Up Consult  Pharmacy Consult for heparin Indication: atrial fibrillation  Labs: Recent Labs    08/25/21 0606 08/26/21 0436 08/26/21 0436 08/26/21 1323 08/27/21 0318 08/28/21 0142  HGB  --  10.0*   < >  --  9.6* 9.2*  HCT  --  31.8*  --   --  31.7* 29.4*  PLT  --  265  --   --  257 249  APTT  --  65*   < > 73* 144* 55*  HEPARINUNFRC  --  >1.10*  --   --  >1.10*  --   CREATININE  --   --   --   --  0.95  --   TROPONINIHS 11  --   --   --   --   --    < > = values in this interval not displayed.    Assessment: 76yo female subtherapeutic on heparin after resumed at lower rate s/p endoscopy; no infusion issues or signs of bleeding per RN.  Goal of Therapy:  aPTT 66-102 seconds   Plan:  Will increase heparin infusion by 1-2 units/kg/hr to 1100 units/hr until off at time of Eliquis dose this am.    Vernard Gambles, PharmD, BCPS  08/28/2021,3:22 AM

## 2021-08-28 NOTE — Discharge Summary (Signed)
Physician Discharge Summary   Jennifer [name] [id number] DOB: 06-11-1945 DOA: 08/24/2021  PCP: Marda Stalker, PA-C  Admit date: 08/24/2021 Discharge date: 08/28/2021   Admitted From: Home Disposition:  Home Discharging physician: Dwyane Dee, MD  Recommendations for Outpatient Follow-up:  Consider referral to surgery regarding hiatal hernia Follow up with GI and response to BID PPI and sucralfate   Home Health:  Equipment/Devices:   Discharge Condition: stable CODE STATUS: Full Diet recommendation:  Diet Orders (From admission, onward)     Start     Ordered   08/27/21 1130  DIET SOFT Room service appropriate? Yes; Fluid consistency: Thin  Diet effective now       Question Answer Comment  Room service appropriate? Yes   Fluid consistency: Thin      08/27/21 1129            Hospital Course: Ms. Rohr is a 76 year old female with PMH GERD, fatigue syndrome, fibromyalgia, hiatal hernia, HTN, interstitial cystitis, OSA, lumbar spinal stenosis who presented with chest pain.  Initial concern on admission was for cardiac etiology and she underwent further work-up along with cardiology evaluation.  She was also initially started on nitroglycerin drip.  Her symptoms were considered to be noncardiac in etiology and she was weaned off of nitro drip.  Troponins remained negative with trending and echo obtained showed no wall motion abnormalities.  GI was then consulted and she underwent barium swallow study which showed esophageal narrowing just above small hiatal hernia with concern for possible Schatzki's ring.  She underwent EGD on 08/27/2021 with no stricture or rings noted.  Multiple gastric polyps biopsied however.  Assessment and Plan: * Chest pain-resolved as of 08/27/2021 - Considered noncardiac in etiology and has been evaluated by cardiology - Echo performed on admission shows EF 65 to 70%, normal LV function, no RWMA, mild LVH, grade 1 diastolic dysfunction -  Troponins trended on admission and were negative x4 - suspicious for hiatal hernia contributing to etiology given negative workup; patient to discuss with PCP outpatient regarding possible surgery referral   Hiatal hernia - graded as "small" on barium swallow; patient appears to have had intermittent symptoms for year; possible that her CP/GERD/dysphagia complaints on admission are due to this as negative cardiac workup and negative EGD findings otherwise - started on BID protonix and sucralfate QID at discharge to see if able to mitigate symptoms - outpatient follow up with GI and PCP - might benefit from surgery referral/eval outpatient pending cardiac clearance if surgery were to be recommended   Atrial flutter ([place]) - Eliquis resumed prior to discharge - continued on metoprolol  CAD (coronary artery disease) - Last heart cath performed 09/16/2020 showing single-vessel CAD involving LAD however was very tortuous and not amenable to intervention.  She is medically managed  HTN (hypertension) - Continue hydralazine, Isordil, losartan, Lopressor  Gastric polyps - Multiple gastric polyps biopsied during EGD performed on 08/27/2021 - Follow-up pathology  Chronic pain syndrome - Continue Norco and gabapentin  Hypertensive urgency-resolved as of 08/26/2021 - Possibly pain and anxiety related on admission along with some difficulty swallowing pills - Blood pressure has now become controlled - Continue hydralazine, Isordil, losartan, Lopressor       The patient's chronic medical conditions were treated accordingly per the patient's home medication regimen except as noted.  On day of discharge, patient was felt deemed stable for discharge. Patient/family member advised to call PCP or come back to ER if needed.   Principal Diagnosis: Chest pain  Discharge Diagnoses: Active Problems:   Atrial flutter (HCC)   Hiatal hernia   CAD (coronary artery disease)   HTN (hypertension)    Gastric polyps   Chronic pain syndrome   Discharge Instructions     Increase activity slowly   Complete by: As directed       Allergies as of 08/28/2021       Reactions   Moxifloxacin Hives, Shortness Of Breath, Swelling, Rash   Rash was severe, caused tremors, ans skin became BLOODY RED   Quinolones Anaphylaxis   Sulfa Antibiotics Hives, Rash   Furosemide Other (See Comments)   CANNOT TAKE DUE TO Interstitial cystitis   Aspirin Nausea Only, Other (See Comments)   GI and stomach upset   Doxazosin    constipation   Duloxetine Hcl Nausea Only, Other (See Comments)   Vertigo and heart racing also   Hydrochlorothiazide Other (See Comments)   Headaches   Ibuprofen Nausea Only, Other (See Comments)   GI upset   Nebivolol Hcl    Other reaction(s): extreme fatigue   Other Other (See Comments)   Ranexa [ranolazine]    Constipation/heart pounding   Robaxin [methocarbamol] Other (See Comments)   "Make me feel flu-like symptoms"   Topiramate Itching   Xarelto [rivaroxaban]    Back pain   Zofran [ondansetron Hcl] Other (See Comments)   Headaches   Codeine Hives, Nausea And Vomiting, Rash   Dimethyl Sulfoxide Hives, Itching   Macrodantin [nitrofurantoin Macrocrystal] Itching, Nausea And Vomiting, Rash   Abdominal and chest pain, also- blood-red skin, also   Nitrofurantoin Diarrhea, Itching, Rash, Other (See Comments)   GI/stomach upset, sweating, chest pain, and skin turned bloody red   Tape Rash   Yellow Dye Rash        Medication List     STOP taking these medications    ezetimibe 10 MG tablet Commonly known as: ZETIA   Meth-Hyo-M Bl-Benz Acd-Ph Sal 81.6 MG Tabs   omeprazole 20 MG capsule Commonly known as: PRILOSEC       TAKE these medications    acetaminophen 500 MG tablet Commonly known as: TYLENOL Take 500 mg by mouth 2 (two) times daily as needed (for pain).   apixaban 5 MG Tabs tablet Commonly known as: ELIQUIS Take 1 tablet (5 mg total) by mouth  2 (two) times daily.   dextromethorphan-guaiFENesin 30-600 MG 12hr tablet Commonly known as: MUCINEX DM Take 1 tablet by mouth every 12 (twelve) hours as needed for cough.   fexofenadine 60 MG tablet Commonly known as: ALLEGRA Take 60 mg by mouth daily as needed for allergies.   gabapentin 300 MG capsule Commonly known as: NEURONTIN Take 300 mg by mouth at bedtime.   hydrALAZINE 50 MG tablet Commonly known as: APRESOLINE Take 1 tablet (50 mg total) by mouth 3 (three) times daily.   HYDROcodone-acetaminophen 7.5-325 MG tablet Commonly known as: NORCO Take 1 tablet by mouth every 8 (eight) hours as needed for moderate pain or severe pain.   hydrocortisone 25 MG suppository Commonly known as: ANUSOL-HC Place 25 mg rectally 2 (two) times daily as needed for hemorrhoids.   isosorbide dinitrate 10 MG tablet Commonly known as: ISORDIL TAKE 1 TABLET BY MOUTH THREE TIMES A DAY   losartan 50 MG tablet Commonly known as: COZAAR Take 1 tablet (50 mg total) by mouth daily.   meclizine 25 MG tablet Commonly known as: ANTIVERT Take 25 mg by mouth 3 (three) times daily as needed for dizziness.   metoprolol  tartrate 25 MG tablet Commonly known as: LOPRESSOR Take 1 tablet (25 mg total) by mouth 2 (two) times daily.   nitroGLYCERIN 0.4 MG SL tablet Commonly known as: NITROSTAT Place 1 tablet (0.4 mg total) under the tongue every 5 (five) minutes as needed for chest pain. Take up to 3 tablets   nystatin cream Commonly known as: MYCOSTATIN Apply topically 2 (two) times daily. What changed:  how much to take when to take this reasons to take this   ondansetron 4 MG disintegrating tablet Commonly known as: ZOFRAN-ODT Take 4 mg by mouth every 8 (eight) hours as needed for nausea (dissolve orally).   pantoprazole 40 MG tablet Commonly known as: PROTONIX Take 1 tablet (40 mg total) by mouth 2 (two) times daily before a meal.   polyethylene glycol powder 17 GM/SCOOP  powder Commonly known as: GLYCOLAX/MIRALAX Take 17 g by mouth daily as needed for mild constipation.   promethazine 25 MG tablet Commonly known as: PHENERGAN Take 25 mg by mouth every 6 (six) hours as needed for vomiting or nausea.   spironolactone 25 MG tablet Commonly known as: ALDACTONE Take 1 tablet (25 mg total) by mouth daily.   sucralfate 1 g tablet Commonly known as: CARAFATE Take 1 tablet (1 g total) by mouth 4 (four) times daily -  with meals and at bedtime. What changed:  when to take this additional instructions   triamcinolone cream 0.1 % Commonly known as: KENALOG Apply 1 application. topically 2 (two) times daily as needed (rash).   Urogesic-Blue 81.6 MG Tabs Take 1 tablet by mouth every 6 (six) hours as needed (urinary burning).        Follow-up Information     Marda Stalker, PA-C. Schedule an appointment as soon as possible for a visit in 1 week(s).   Specialty: Family Medicine Contact information: [place] [address] [phone]         Werner Lean, MD .   Specialty: Cardiology Contact information: [address] [place] [address] [phone]         Vickie Epley, MD .   Specialties: Cardiology, Radiology Contact information: [address] [place] [address] [phone]                Allergies  Allergen Reactions   Moxifloxacin Hives, Shortness Of Breath, Swelling and Rash    Rash was severe, caused tremors, ans skin became BLOODY RED   Quinolones Anaphylaxis   Sulfa Antibiotics Hives and Rash   Furosemide Other (See Comments)    CANNOT TAKE DUE TO Interstitial cystitis   Aspirin Nausea Only and Other (See Comments)    GI and stomach upset   Doxazosin     constipation   Duloxetine Hcl Nausea Only and Other (See Comments)    Vertigo and heart racing also    Hydrochlorothiazide Other (See Comments)    Headaches    Ibuprofen Nausea Only and  Other (See Comments)    GI upset   Nebivolol Hcl     Other reaction(s): extreme fatigue   Other Other (See Comments)   Ranexa [Ranolazine]     Constipation/heart pounding   Robaxin [Methocarbamol] Other (See Comments)    "Make me feel flu-like symptoms"   Topiramate Itching   Xarelto [Rivaroxaban]     Back pain   Zofran [Ondansetron Hcl] Other (See Comments)    Headaches    Codeine Hives, Nausea And Vomiting and Rash  Dimethyl Sulfoxide Hives and Itching   Macrodantin [Nitrofurantoin Macrocrystal] Itching, Nausea And Vomiting and Rash    Abdominal and chest pain, also- blood-red skin, also   Nitrofurantoin Diarrhea, Itching, Rash and Other (See Comments)    GI/stomach upset, sweating, chest pain, and skin turned bloody red   Tape Rash   Yellow Dye Rash    Consultations: GI Cardiology  Procedures: EGD, 08/27/21  Discharge Exam: BP (!) 141/58   Pulse 74   Temp 97.8 F (36.6 C) (Oral)   Resp 18   Ht 5\' 4"  (1.626 m)   Wt 86.9 kg   SpO2 96%   BMI 32.88 kg/m  Physical Exam Constitutional:      General: She is not in acute distress.    Appearance: Normal appearance.  HENT:     Head: Normocephalic and atraumatic.     Mouth/Throat:     Mouth: Mucous membranes are moist.  Eyes:     Extraocular Movements: Extraocular movements intact.  Cardiovascular:     Rate and Rhythm: Normal rate and regular rhythm.     Heart sounds: Murmur heard.     Comments: 3/6 HSM appreciated in upper sternal borders Pulmonary:     Effort: Pulmonary effort is normal.     Breath sounds: Normal breath sounds.  Abdominal:     General: Bowel sounds are normal. There is no distension.     Palpations: Abdomen is soft.     Tenderness: There is no abdominal tenderness.  Musculoskeletal:        General: Normal range of motion.     Cervical back: Normal range of motion and neck supple.  Skin:    General: Skin is warm and dry.  Neurological:     General: No focal deficit present.     Mental  Status: She is alert.  Psychiatric:        Mood and Affect: Mood normal.        Behavior: Behavior normal.     The results of significant diagnostics from this hospitalization (including imaging, microbiology, ancillary and laboratory) are listed below for reference.   Microbiology: No results found for this or any previous visit (from the past 240 hour(s)).   Labs: BNP (last 3 results) Recent Labs    09/25/20 0924  BNP [id number]*   Basic Metabolic Panel: Recent Labs  Lab 08/24/21 2129 08/27/21 0318  NA 140 139  K 3.8 4.2  CL 109 107  CO2 23 27  GLUCOSE 106* 95  BUN 12 7*  CREATININE 0.92 0.95  CALCIUM 9.0 8.7*  MG  --  2.2   Liver Function Tests: Recent Labs  Lab 08/24/21 2129  AST 22  ALT 19  ALKPHOS 57  BILITOT 0.8  PROT 6.7  ALBUMIN 3.7   No results for input(s): LIPASE, AMYLASE in the last 168 hours. No results for input(s): AMMONIA in the last 168 hours. CBC: Recent Labs  Lab 08/24/21 2129 08/26/21 0436 08/27/21 0318 08/28/21 0142  WBC 7.0 5.6 6.3 5.3  NEUTROABS 4.8  --   --   --   HGB 10.0* 10.0* 9.6* 9.2*  HCT 32.2* 31.8* 31.7* 29.4*  MCV 84.7 84.4 85.0 84.7  PLT 280 265 257 249   Cardiac Enzymes: No results for input(s): CKTOTAL, CKMB, CKMBINDEX, TROPONINI in the last 168 hours. BNP: Invalid input(s): POCBNP CBG: No results for input(s): GLUCAP in the last 168 hours. D-Dimer No results for input(s): DDIMER in the last 72 hours. Hgb A1c No results  for input(s): HGBA1C in the last 72 hours. Lipid Profile No results for input(s): CHOL, HDL, LDLCALC, TRIG, CHOLHDL, LDLDIRECT in the last 72 hours. Thyroid function studies No results for input(s): TSH, T4TOTAL, T3FREE, THYROIDAB in the last 72 hours.  Invalid input(s): FREET3 Anemia work up No results for input(s): VITAMINB12, FOLATE, FERRITIN, TIBC, IRON, RETICCTPCT in the last 72 hours. Urinalysis    Component Value Date/Time   COLORURINE YELLOW 01/10/2021 1200   APPEARANCEUR CLOUDY  (A) 01/10/2021 1200   LABSPEC 1.016 01/10/2021 1200   PHURINE 7.0 01/10/2021 1200   GLUCOSEU NEGATIVE 01/10/2021 1200   HGBUR NEGATIVE 01/10/2021 1200   BILIRUBINUR NEGATIVE 01/10/2021 1200   KETONESUR NEGATIVE 01/10/2021 1200   PROTEINUR NEGATIVE 01/10/2021 1200   NITRITE POSITIVE (A) 01/10/2021 1200   LEUKOCYTESUR LARGE (A) 01/10/2021 1200   Sepsis Labs Invalid input(s): PROCALCITONIN,  WBC,  LACTICIDVEN Microbiology No results found for this or any previous visit (from the past 240 hour(s)).  Procedures/Studies: MR LUMBAR SPINE WO CONTRAST  Result Date: 08/17/2021 CLINICAL DATA:  Low back pain for 7 months, bilateral leg pain. Fall in February EXAM: MRI LUMBAR SPINE WITHOUT CONTRAST TECHNIQUE: Multiplanar, multisequence MR imaging of the lumbar spine was performed. No intravenous contrast was administered. COMPARISON:  Lumbar spine MRI 03/20/2019 FINDINGS: Segmentation: Standard; the lowest formed disc space is designated L5-S1 Alignment: There is marked levocurvature centered at L2-L3, similar to the prior study. There is no significant antero or retrolisthesis. Vertebrae: Vertebral body heights are preserved. Background marrow signal is normal. There is no evidence of acute injury. A benign intraosseous hemangiomas in the L3 and S1 vertebral bodies are unchanged. There is mild degenerative endplate marrow signal abnormality with edema at L3-L4 increased since the prior study. Conus medullaris and cauda equina: Conus extends to the T12-L1 level. Conus and cauda equina appear normal, although displaced to the right within the thecal sac in the upper lumbar spine. Paraspinal and other soft tissues: Unremarkable. Disc levels: Disc desiccation and narrowing is most advanced at L3-L4, with more mild disc degeneration at the other levels. T12-L1: The conus and cauda equina nerve roots are displaced to the right within the thecal sac due to the scoliotic curvature. There is facet arthropathy  resulting in mild narrowing of the subarticular zone without evidence of frank nerve root impingement. No significant neural foraminal stenosis. L1-L2: The cauda equina nerve roots are displaced to the right due to the scoliotic curvature. There is a mild right subarticular disc protrusion and mild facet arthropathy resulting in narrowing of the subarticular zone without evidence of frank nerve root impingement, and mild right and no significant left neural foraminal stenosis, not significantly changed. L2-L3: The cauda equina nerve roots are displaced to the right due to the scoliotic curvature. There is a mild disc bulge and right worse than left facet arthropathy resulting in mild bilateral neural foraminal stenosis without significant spinal canal stenosis. No significant subarticular zone narrowing. Findings are not significantly changed. L3-L4: There is a diffuse disc bulge and bulky bilateral facet arthropathy resulting in moderate to severe spinal canal stenosis with effacement of the left subarticular zone and probable impingement of the traversing L4 nerve root and severe right worse than left neural foraminal stenosis with probable impingement of the exiting L3 nerve roots. Findings are overall not significantly changed. L4-L5: There is a mild disc bulge and bulky left worse than right facet arthropathy resulting in narrowing of the left subarticular zone with possible irritation of the traversing L4 nerve  root, and moderate left and no significant right neural foraminal stenosis, not significantly changed. L5-S1: There is a mild disc bulge and mild bilateral facet arthropathy resulting in moderate left and no significant right neural foraminal stenosis and no significant spinal canal stenosis, not significantly changed. IMPRESSION: 1. Marked levocurvature of the lumbar spine is similar to the prior study. The conus and cauda equina nerve roots are displaced to the right within the thecal sac at T12-L1  through L2-L3 due to the scoliotic curvature, without evidence of frank impingement at these levels. 2. Bulky facet arthropathy at L3-L4 resulting in moderate to severe spinal canal stenosis with effacement of the left subarticular zone and probable impingement of the traversing left L4 nerve root, and severe right worse than left neural foraminal stenosis with probable impingement of the exiting L3 nerve roots. These findings are not significantly changed common but degenerative endplate signal abnormality with edema has increased. 3. Left worse than right facet arthropathy at L4-L5 with narrowing of the left subarticular zone and possible irritation of the traversing L4 nerve root, and moderate left neural foraminal stenosis, not significantly changed. 4. Moderate left neural foraminal stenosis at L5-S1, not significantly changed. Electronically Signed   By: Valetta Mole M.D.   On: 08/17/2021 16:54   DG Chest Port 1 View  Result Date: 08/24/2021 CLINICAL DATA:  Chest pain EXAM: PORTABLE CHEST 1 VIEW COMPARISON:  01/09/2021 FINDINGS: Lungs are clear.  No pleural effusion or pneumothorax. The heart is normal in size. Mild degenerative changes of the thoracic spine. IMPRESSION: No evidence of acute cardiopulmonary disease. Electronically Signed   By: Julian Hy M.D.   On: 08/24/2021 21:14   DG Foot Complete Left  Result Date: 08/25/2021 Please see detailed radiograph report in office note.  ECHOCARDIOGRAM COMPLETE  Result Date: 08/25/2021    ECHOCARDIOGRAM REPORT   Patient Name:   Jennifer [name] Date of Exam: 08/25/2021 Medical Rec #:  [id number]        Height:       64.0 in Accession #:    [id number]       Weight:       190.9 lb Date of Birth:  [birth year]        BSA:          1.918 m Patient Age:    5 years         BP:           169/76 mmHg Patient Gender: F                HR:           71 bpm. Exam Location:  Inpatient Procedure: 2D Echo, Color Doppler and Cardiac Doppler Indications:    R07.9*  Chest pain, unspecified  History:        Patient has prior history of Echocardiogram examinations, most                 recent 01/10/2021. CHF; Risk Factors:Hypertension and Sleep                 Apnea.  Sonographer:    Raquel Sarna Senior RDCS Referring Phys: [id number] [place]  1. Left ventricular ejection fraction, by estimation, is 65 to 70%. The left ventricle has normal function. The left ventricle has no regional wall motion abnormalities. There is mild left ventricular hypertrophy. Left ventricular diastolic parameters are consistent with Grade I diastolic dysfunction (impaired relaxation).  2. Right ventricular systolic function is normal.  The right ventricular size is normal. Tricuspid regurgitation signal is inadequate for assessing PA pressure.  3. Right atrial size was mildly dilated.  4. The mitral valve is grossly normal. Trivial mitral valve regurgitation. No evidence of mitral stenosis.  5. The aortic valve is abnormal. There is severe calcifcation of the aortic valve. Aortic valve regurgitation is trivial. Moderate aortic valve stenosis. Aortic valve mean gradient measures 23.8 mmHg. Aortic valve Vmax measures 3.30 m/s.  6. Aortic dilatation noted. There is mild dilatation of the ascending aorta, measuring 41 mm.  7. The inferior vena cava is normal in size with greater than 50% respiratory variability, suggesting right atrial pressure of 3 mmHg.  8. Increased flow velocities may be secondary to anemia, thyrotoxicosis, hyperdynamic or high flow state. Comparison(s): A prior study was performed on 01/10/21. Changes from prior study are noted. Aortic valve systolic gradient has increased. FINDINGS  Left Ventricle: Left ventricular ejection fraction, by estimation, is 65 to 70%. The left ventricle has normal function. The left ventricle has no regional wall motion abnormalities. The left ventricular internal cavity size was normal in size. There is  mild left ventricular hypertrophy. Left  ventricular diastolic parameters are consistent with Grade I diastolic dysfunction (impaired relaxation). Right Ventricle: The right ventricular size is normal. No increase in right ventricular wall thickness. Right ventricular systolic function is normal. Tricuspid regurgitation signal is inadequate for assessing PA pressure. Left Atrium: Left atrial size was normal in size. Right Atrium: Right atrial size was mildly dilated. Pericardium: Trivial pericardial effusion is present. Mitral Valve: The mitral valve is grossly normal. Mild mitral annular calcification. Trivial mitral valve regurgitation. No evidence of mitral valve stenosis. Tricuspid Valve: The tricuspid valve is normal in structure. Tricuspid valve regurgitation is trivial. No evidence of tricuspid stenosis. Aortic Valve: The aortic valve is abnormal. There is severe calcifcation of the aortic valve. Aortic valve regurgitation is trivial. Moderate aortic stenosis is present. Aortic valve mean gradient measures 23.8 mmHg. Aortic valve peak gradient measures 43.5 mmHg. Aortic valve area, by VTI measures 1.66 cm. Pulmonic Valve: The pulmonic valve was normal in structure. Pulmonic valve regurgitation is not visualized. No evidence of pulmonic stenosis. Aorta: Aortic dilatation noted. There is mild dilatation of the ascending aorta, measuring 41 mm. Venous: The inferior vena cava is normal in size with greater than 50% respiratory variability, suggesting right atrial pressure of 3 mmHg. IAS/Shunts: No atrial level shunt detected by color flow Doppler.  LEFT VENTRICLE PLAX 2D LVIDd:         4.30 cm   Diastology LVIDs:         2.30 cm   LV e' medial:    5.66 cm/s LV PW:         1.20 cm   LV E/e' medial:  12.7 LV IVS:        1.30 cm   LV e' lateral:   6.85 cm/s LVOT diam:     2.10 cm   LV E/e' lateral: 10.5 LV SV:         119 LV SV Index:   62 LVOT Area:     3.46 cm  RIGHT VENTRICLE RV S prime:     21.80 cm/s TAPSE (M-mode): 3.3 cm LEFT ATRIUM              Index        RIGHT ATRIUM           Index LA diam:        3.70  cm 1.93 cm/m   RA Area:     20.50 cm LA Vol (A2C):   58.4 ml 30.45 ml/m  RA Volume:   54.70 ml  28.52 ml/m LA Vol (A4C):   56.4 ml 29.40 ml/m LA Biplane Vol: 61.5 ml 32.06 ml/m  AORTIC VALVE AV Area (Vmax):    1.61 cm AV Area (Vmean):   1.55 cm AV Area (VTI):     1.66 cm AV Vmax:           329.63 cm/s AV Vmean:          240.676 cm/s AV VTI:            0.720 m AV Peak Grad:      43.5 mmHg AV Mean Grad:      23.8 mmHg LVOT Vmax:         153.00 cm/s LVOT Vmean:        108.000 cm/s LVOT VTI:          0.345 m LVOT/AV VTI ratio: 0.48  AORTA Ao Root diam: 3.00 cm Ao Asc diam:  4.10 cm MITRAL VALVE MV Area (PHT): 2.43 cm     SHUNTS MV Decel Time: 312 msec     Systemic VTI:  0.34 m MV E velocity: 71.80 cm/s   Systemic Diam: 2.10 cm MV A velocity: 110.00 cm/s MV E/A ratio:  0.65 Cherlynn Kaiser MD Electronically signed by Cherlynn Kaiser MD Signature Date/Time: 08/25/2021/10:43:47 AM    Final    DG ESOPHAGUS W SINGLE CM (SOL OR THIN BA)  Result Date: 08/25/2021 CLINICAL DATA:  Chest discomfort. Dysphagia to solid foods. History of gastroesophageal reflux. EXAM: ESOPHAGUS/BARIUM SWALLOW/TABLET STUDY TECHNIQUE: Single contrast examination was performed using thin liquid barium. This exam was performed by Ascencion Dike PA-C, and was supervised and interpreted by Dr. Macy Mis. FLUOROSCOPY: Radiation Exposure Index (as provided by the fluoroscopic device): 25.50 mGy Kerma COMPARISON:  None Available. FINDINGS: Swallowing: Limited evaluation but appears normal. No vestibular penetration or aspiration seen. Pharynx: Unremarkable. Esophagus: No gross mucosal lesions on this single contrast study. Slightly asymmetric circumferential filling defect of the distal esophagus just above the gastroesophageal junction. Contrast passes through this area freely. Esophageal motility: No significant dysmotility for age. Hiatal Hernia: Small hiatal hernia is present.  Gastroesophageal reflux: Spontaneous reflux noted, significantly exacerbated with Valsalva maneuver. Ingested 13 mm barium tablet: Mildly delayed passage through the area of narrowing and hiatal hernia into remainder of stomach. IMPRESSION: Narrowing of the esophagus just above a small hiatal hernia may represent a Schatzki's ring. There is only mildly delayed passage of 13 mm barium tablet through this area and no obstruction to contrast. Significant gastroesophageal reflux appreciated both spontaneously and with provocative maneuvers. Read by: Ascencion Dike PA-C Electronically Signed   By: Macy Mis M.D.   On: 08/25/2021 16:36     Time coordinating discharge: Over 30 minutes    Dwyane Dee, MD  [facility] 08/28/2021, 11:34 AM

## 2021-08-28 NOTE — Assessment & Plan Note (Signed)
-   graded as "small" on barium swallow; patient appears to have had intermittent symptoms for year; possible that her CP/GERD/dysphagia complaints on admission are due to this as negative cardiac workup and negative EGD findings otherwise - started on BID protonix and sucralfate QID at discharge to see if able to mitigate symptoms - outpatient follow up with GI and PCP - might benefit from surgery referral/eval outpatient pending cardiac clearance if surgery were to be recommended

## 2021-08-28 NOTE — Care Management Important Message (Signed)
Important Message  Patient Details  Name: Jennifer [name] MRN: [id number] Date of Birth: 1945/08/24   Medicare Important Message Given:  Yes     Dorena Bodo 08/28/2021, 1:47 PM

## 2021-08-28 NOTE — Progress Notes (Signed)
Mobility Specialist Progress Note:   08/28/21 1138  Mobility  Activity Ambulated with assistance in hallway  Level of Assistance Independent after set-up  Assistive Device None  Distance Ambulated (ft) 350 ft  Activity Response Tolerated well  $Mobility charge 1 Mobility   Pt received in chair willing to participate in mobility. No complaints of pain. Left in chair with call bell in reach and all needs met.   [facility] Laterra Lubinski Mobility Specialist

## 2021-08-28 NOTE — Progress Notes (Signed)
Progress Note  Patient Name: Jennifer [name] Date of Encounter: 08/28/2021  Primary Cardiologist: Werner Lean, MD  Subjective   Reports some reflux symptoms.  Ate breakfast this morning.  No recent palpitations.  Inpatient Medications    Scheduled Meds:  apixaban  5 mg Oral BID   gabapentin  300 mg Oral QHS   hydrALAZINE  50 mg Oral TID   isosorbide dinitrate  10 mg Oral TID   losartan  50 mg Oral Daily   metoprolol tartrate  25 mg Oral BID   pantoprazole  40 mg Oral BID AC   spironolactone  25 mg Oral Daily   sucralfate  1 g Oral TID WC & HS   Continuous Infusions:  heparin 1,100 Units/hr (08/28/21 0551)   PRN Meds: acetaminophen **OR** acetaminophen, HYDROcodone-acetaminophen   Vital Signs    Vitals:   08/27/21 1621 08/27/21 2029 08/28/21 0352 08/28/21 0844  BP: 135/63 (!) 107/56 126/67 (!) 141/58  Pulse:  75 (!) 55 74  Resp:  18 18   Temp:  98 F (36.7 C) 97.8 F (36.6 C)   TempSrc:  Oral Oral   SpO2:  97% 96%   Weight:      Height:        Intake/Output Summary (Last 24 hours) at 08/28/2021 0907 Last data filed at 08/28/2021 0802 Gross per 24 hour  Intake 370.98 ml  Output 1500 ml  Net -1129.02 ml   Filed Weights   08/26/21 0500 08/27/21 0423 08/27/21 1021  Weight: 88.3 kg 86.9 kg 86.9 kg    Telemetry    Sinus rhythm.  Personally reviewed.  ECG    No ECG reviewed.  Physical Exam   GEN: No acute distress.   Neck: No JVD. Cardiac: RRR, 2/6 systolic murmur, no gallop.  Respiratory: Nonlabored. Clear to auscultation bilaterally. MS: No edema; No deformity. Neuro:  Nonfocal. Psych: Alert and oriented x 3. Normal affect.  Labs    Chemistry Recent Labs  Lab 08/24/21 2129 08/27/21 0318  NA 140 139  K 3.8 4.2  CL 109 107  CO2 23 27  GLUCOSE 106* 95  BUN 12 7*  CREATININE 0.92 0.95  CALCIUM 9.0 8.7*  PROT 6.7  --   ALBUMIN 3.7  --   AST 22  --   ALT 19  --   ALKPHOS 57  --   BILITOT 0.8  --   GFRNONAA >60 >60   ANIONGAP 8 5     Hematology Recent Labs  Lab 08/26/21 0436 08/27/21 0318 08/28/21 0142  WBC 5.6 6.3 5.3  RBC 3.77* 3.73* 3.47*  HGB 10.0* 9.6* 9.2*  HCT 31.8* 31.7* 29.4*  MCV 84.4 85.0 84.7  MCH 26.5 25.7* 26.5  MCHC 31.4 30.3 31.3  RDW 14.8 15.0 14.9  PLT 265 257 249    Cardiac Enzymes Recent Labs  Lab 08/24/21 2129 08/24/21 2310 08/25/21 0247 08/25/21 0606  TROPONINIHS 13 13 13 11     Radiology    No results found.  Cardiac Studies   Echocardiogram 08/25/2021:  1. Left ventricular ejection fraction, by estimation, is 65 to 70%. The  left ventricle has normal function. The left ventricle has no regional  wall motion abnormalities. There is mild left ventricular hypertrophy.  Left ventricular diastolic parameters  are consistent with Grade I diastolic dysfunction (impaired relaxation).   2. Right ventricular systolic function is normal. The right ventricular  size is normal. Tricuspid regurgitation signal is inadequate for assessing  PA pressure.  3. Right atrial size was mildly dilated.   4. The mitral valve is grossly normal. Trivial mitral valve  regurgitation. No evidence of mitral stenosis.   5. The aortic valve is abnormal. There is severe calcifcation of the  aortic valve. Aortic valve regurgitation is trivial. Moderate aortic valve  stenosis. Aortic valve mean gradient measures 23.8 mmHg. Aortic valve Vmax  measures 3.30 m/s.   6. Aortic dilatation noted. There is mild dilatation of the ascending  aorta, measuring 41 mm.   7. The inferior vena cava is normal in size with greater than 50%  respiratory variability, suggesting right atrial pressure of 3 mmHg.   8. Increased flow velocities may be secondary to anemia, thyrotoxicosis,  hyperdynamic or high flow state.   Assessment & Plan    1.  Paroxysmal atrial flutter with CHA2DS2-VASc score of 5.  She was transitioned from heparin to Eliquis this morning and remains on Lopressor.  She is in sinus  rhythm.  2.  CAD predominantly involving the mid to distal LAD with plan for medical therapy.  High-sensitivity troponin I levels are normal.  Continue Lopressor and Isordil.  Also has been on Zetia as an outpatient with history of statin intolerance.  LVEF 65 to 70%.  3.  Essential hypertension, remains on hydralazine, Isordil, Cozaar, Lopressor, and Aldactone.  4.  Gastric polyps status post EGD on May 28 with biopsies taken.  Continue with current cardiac regimen.  She is stable for discharge from a cardiovascular perspective.  Already has EP consultation pending with Dr. Quentin Ore this week for further discussion of atrial flutter management.  Signed, Rozann Lesches, MD  08/28/2021, 9:07 AM

## 2021-08-28 NOTE — TOC Progression Note (Signed)
Transition of Care [facility]) - Progression Note    Patient Details  Name: Jennifer [name] MRN: [id number] Date of Birth: November 26, 1945  Transition of Care [facility]) CM/SW Contact  Erin Sons, [place] Phone Number: 08/28/2021, 10:54 AM  Clinical Narrative:     Pt needing transportation assistance. No family/friends available for transport and unable to afford taxi. Bus would not be appropriate; CSW provided RN with taxi voucher.        Expected Discharge Plan and Services           Expected Discharge Date: 08/28/21                                     Social Determinants of Health (SDOH) Interventions    Readmission Risk Interventions     View : No data to display.

## 2021-08-28 NOTE — Progress Notes (Signed)
Mobility Specialist Progress Note:   08/28/21 1015  Mobility  Activity Ambulated with assistance in room;Ambulated with assistance to bathroom  Level of Assistance Independent  Assistive Device None  Distance Ambulated (ft) 30 ft  Activity Response Tolerated well  $Mobility charge 1 Mobility   Pt received in chair willing to participate in mobility. No complaints of pain. Left in bathroom with all needs met. Will follow-up shortly for hallway ambulation.   [facility] Jennifer [name] Mobility Specialist

## 2021-08-29 ENCOUNTER — Encounter (HOSPITAL_COMMUNITY): Payer: Self-pay | Admitting: Gastroenterology

## 2021-08-29 LAB — SURGICAL PATHOLOGY

## 2021-08-30 ENCOUNTER — Encounter: Payer: Self-pay | Admitting: Cardiology

## 2021-08-30 ENCOUNTER — Encounter: Payer: Self-pay | Admitting: *Deleted

## 2021-08-30 ENCOUNTER — Ambulatory Visit: Payer: Medicare Other

## 2021-08-30 ENCOUNTER — Ambulatory Visit (INDEPENDENT_AMBULATORY_CARE_PROVIDER_SITE_OTHER): Payer: Medicare Other | Admitting: Cardiology

## 2021-08-30 VITALS — BP 136/70 | HR 65 | Ht 64.0 in | Wt 195.2 lb

## 2021-08-30 DIAGNOSIS — I4892 Unspecified atrial flutter: Secondary | ICD-10-CM | POA: Diagnosis not present

## 2021-08-30 DIAGNOSIS — I10 Essential (primary) hypertension: Secondary | ICD-10-CM

## 2021-08-30 DIAGNOSIS — I5032 Chronic diastolic (congestive) heart failure: Secondary | ICD-10-CM

## 2021-08-30 DIAGNOSIS — I25119 Atherosclerotic heart disease of native coronary artery with unspecified angina pectoris: Secondary | ICD-10-CM | POA: Diagnosis not present

## 2021-08-30 NOTE — Progress Notes (Unsigned)
Enrolled for [employer] to mail a ZIO XT long term holter monitor to the patients address on file.  

## 2021-08-30 NOTE — Progress Notes (Signed)
Electrophysiology Office Note:    Date:  08/30/2021   ID:  IVEY NEMBHARD, DOB November 18, 1945, MRN [id number]  PCP:  Jarrett Soho, PA-C  [facility] Cardiologist:  Christell Constant, MD  [facility] Electrophysiologist:  Lanier Prude, MD   Referring MD: Corrin Parker, PA-C   Chief Complaint: atrial flutter  History of Present Illness:    Jennifer [name] is a 76 y.o. female who presents for an evaluation of atrial flutter at the request of Marjie Skiff, PA-C. Their medical history includes CAD with significant LAD disease being managed medically, HLD, chroncic pain syndrome , OSA not on CPAP. She is on [facility] for her afl.  Today she tells me she is at a very low burden of atrial flutter.  We spent a significant amount of time discussing the timeline of her symptoms.  We discussed her chest discomfort which recently took her to the emergency department.  She describes a burning chest discomfort.  Not clearly associated with exertion.  It was relieved by nitroglycerin.  It was a burning type pain.     Past Medical History:  Diagnosis Date   Acute meniscal tear of left knee    FOLLOWED BY DR GIAFFREY   Atrial flutter (HCC)    AV block, Mobitz 1    BPPV (benign paroxysmal positional vertigo)    Chronic bladder pain    Chronic fatigue syndrome    Chronic low back pain    W/ RIGHT LEG PAIN AND NUMBNESS   Cystitis, chronic    Fibromyalgia    GERD (gastroesophageal reflux disease)    Hiatal hernia    Hypertension    IC (interstitial cystitis)    OSA (obstructive sleep apnea)    per pt study yrs ago-- moderate osa ,  intolerant cpap   Pinched vertebral nerve    bilateral L2 -- L3 and L3 -- L4-/  epidural injection's treatment , PT and pain clince   PONV (postoperative nausea and vomiting)    severe   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    Self-catheterizes urinary bladder    Spinal stenosis, lumbar region with neurogenic  claudication    Wears glasses    Wears partial dentures    upper and lower    Past Surgical History:  Procedure Laterality Date   ABDOMINAL AORTOGRAM N/A 09/16/2020   Procedure: ABDOMINAL AORTOGRAM;  Surgeon: Iran Ouch, MD;  [facility]: [facility];  Service: Cardiovascular;  Laterality: N/A;   ABDOMINAL HYSTERECTOMY  1980   w/ Left Salpingoophorectomy   BIOPSY  08/27/2021   Procedure: BIOPSY;  Surgeon: Vida Rigger, MD;  Location: [facility] ENDOSCOPY;  Service: Gastroenterology;;   CARDIAC CATHETERIZATION  08-21-2001  dr Verdis Prime   normal coronary arteries and LVF   CARPAL TUNNEL RELEASE Bilateral 1986   CECALCYSTOPLASTY/  APPENDECTOMY  1984   at [facility]   CHOLECYSTECTOMY  1992   CYSTO WITH HYDRODISTENSION N/A 01/12/2016   Procedure: CYSTOSCOPY/HYDRODISTENSION;  Surgeon: Jethro Bolus, MD;  Location: [facility];  Service: Urology;  Laterality: N/A;   CYSTO WITH HYDRODISTENSION N/A 08/30/2016   Procedure: CYSTOSCOPY/HYDRODISTENSION OF BLADDER INJECTION OF MARCAINE/PYRIDIUM;  Surgeon: Jethro Bolus, MD;  Location: [facility];  Service: Urology;  Laterality: N/A;   ESOPHAGOGASTRODUODENOSCOPY (EGD) WITH PROPOFOL N/A 08/27/2021   Procedure: ESOPHAGOGASTRODUODENOSCOPY (EGD) WITH PROPOFOL;  Surgeon: Vida Rigger, MD;  [facility] END[facility]COPY;  Service: Gastroenterology;  Laterality: N/A;  LEFT HEART CATH AND CORONARY ANGIOGRAPHY N/A 09/16/2020   Procedure: LEFT HEART CATH AND CORONARY ANGIOGRAPHY;  Surgeon: Iran Ouch, MD;  Location: [facility];  Service: Cardiovascular;  Laterality: N/A;   MULTIPLE CYSTO/  HYDRODISTENTION/  INSTILLATION THERAPY  last one 08-19-2009   NASAL SINUS SURGERY  1987   TONSILLECTOMY  as child   VAGINAL GROWTH REMOVED  as teen    Current Medications: Current Meds  Medication Sig   acetaminophen (TYLENOL) 500 MG tablet Take 500 mg by mouth 2 (two) times daily as needed (for pain).   apixaban  (ELIQUIS) 5 MG TABS tablet Take 1 tablet (5 mg total) by mouth 2 (two) times daily.   dextromethorphan-guaiFENesin (MUCINEX DM) 30-600 MG 12hr tablet Take 1 tablet by mouth every 12 (twelve) hours as needed for cough.   fexofenadine (ALLEGRA) 60 MG tablet Take 60 mg by mouth daily as needed for allergies.   gabapentin (NEURONTIN) 300 MG capsule Take 300 mg by mouth at bedtime.   hydrALAZINE (APRESOLINE) 50 MG tablet Take 1 tablet (50 mg total) by mouth 3 (three) times daily.   HYDROcodone-acetaminophen (NORCO) 7.5-325 MG tablet Take 1 tablet by mouth every 8 (eight) hours as needed for moderate pain or severe pain.   hydrocortisone (ANUSOL-HC) 25 MG suppository Place 25 mg rectally 2 (two) times daily as needed for hemorrhoids.   isosorbide dinitrate (ISORDIL) 10 MG tablet TAKE 1 TABLET BY MOUTH THREE TIMES A DAY   meclizine (ANTIVERT) 25 MG tablet Take 25 mg by mouth 3 (three) times daily as needed for dizziness.   Methen-Hyosc-Meth Blue-Na Phos (UROGESIC-BLUE) 81.6 MG TABS Take 1 tablet by mouth every 6 (six) hours as needed (urinary burning).   metoprolol tartrate (LOPRESSOR) 25 MG tablet Take 1 tablet (25 mg total) by mouth 2 (two) times daily.   nitroGLYCERIN (NITROSTAT) 0.4 MG SL tablet Place 1 tablet (0.4 mg total) under the tongue every 5 (five) minutes as needed for chest pain. Take up to 3 tablets   nystatin cream (MYCOSTATIN) Apply topically 2 (two) times daily.   ondansetron (ZOFRAN-ODT) 4 MG disintegrating tablet Take 4 mg by mouth every 8 (eight) hours as needed for nausea (dissolve orally).   pantoprazole (PROTONIX) 40 MG tablet Take 1 tablet (40 mg total) by mouth 2 (two) times daily before a meal.   polyethylene glycol powder (GLYCOLAX/MIRALAX) 17 GM/SCOOP powder Take 17 g by mouth daily as needed for mild constipation.   promethazine (PHENERGAN) 25 MG tablet Take 25 mg by mouth every 6 (six) hours as needed for vomiting or nausea.   spironolactone (ALDACTONE) 25 MG tablet Take 1  tablet (25 mg total) by mouth daily.   sucralfate (CARAFATE) 1 g tablet Take 1 tablet (1 g total) by mouth 4 (four) times daily -  with meals and at bedtime.   triamcinolone cream (KENALOG) 0.1 % Apply 1 application. topically 2 (two) times daily as needed (rash).     Allergies:   Moxifloxacin, Quinolones, Sulfa antibiotics, Furosemide, Aspirin, Doxazosin, Duloxetine hcl, Hydrochlorothiazide, Ibuprofen, Nebivolol hcl, Other, Ranexa [ranolazine], Robaxin [methocarbamol], Topiramate, Xarelto [rivaroxaban], Zofran [ondansetron hcl], Codeine, Dimethyl sulfoxide, Macrodantin [nitrofurantoin macrocrystal], Nitrofurantoin, Tape, and Yellow dye   Social History   Socioeconomic History   Marital status: Single    Spouse name: none   Number of children: 0   Years of education: College   Highest education level: Not on file  Occupational History   Occupation: n/a  Tobacco Use   Smoking status: Never  Smokeless tobacco: Never  Vaping Use   Vaping Use: Never used  Substance and Sexual Activity   Alcohol use: No    Alcohol/week: 0.0 standard drinks   Drug use: No   Sexual activity: Not on file  Other Topics Concern   Not on file  Social History Narrative   Lives alone   Sandrea MatteFriend Kim Lewis, RN    Caffeine use: 2 glasses tea/day   Brother   Mother passed in ?[id number] at [id number], father passed in 3090's in 2006   She was a Runner, broadcasting/film/videoTeacher      Social Determinants of Corporate investment bankerHealth   Financial Resource Strain: Low Risk    Difficulty of Paying Living Expenses: Not hard at all  Food Insecurity: No Food Insecurity   Worried About Programme researcher, broadcasting/film/videounning Out of Food in the Last Year: Never true   Baristaan Out of Food in the Last Year: Never true  Transportation Needs: Unmet Transportation Needs   Lack of Transportation (Medical): Yes   Lack of Transportation (Non-Medical): Yes  Physical Activity: Not on file  Stress: No Stress Concern Present   Feeling of Stress : Only a little  Social Connections: Moderately Integrated   Frequency  of Communication with Friends and Family: Twice a week   Frequency of Social Gatherings with Friends and Family: Twice a week   Attends Religious Services: 1 to 4 times per year   Active Member of [employer] FinancialClubs or Organizations: Yes   Attends BankerClub or Organization Meetings: 1 to 4 times per year   Marital Status: Never married     Family History: The patient's family history includes Alzheimer's disease in her father; Asthma in her father; CAD in her father; COPD in her father; Cancer in her brother; Colonic polyp in her mother; Emphysema in her father; Heart failure in her mother; Hypertension in her father and mother; Peripheral vascular disease in her mother; Stroke in her mother.  ROS:   Please see the history of present illness.    All other systems reviewed and are negative.  EKGs/Labs/Other Studies Reviewed:    The following studies were reviewed today:  08/25/2021 echo EF 65 RV normal Mildly dilated RA Trivial MR Moderate AS   EKG:  The ekg ordered today demonstrates sinus rhythm   Recent Labs: 09/25/2020: B Natriuretic Peptide 106.8 01/10/2021: TSH 1.973 08/24/2021: ALT 19 08/27/2021: BUN 7; Creatinine, Ser 0.95; Magnesium 2.2; Potassium 4.2; Sodium 139 08/28/2021: Hemoglobin 9.2; Platelets 249  Recent Lipid Panel    Component Value Date/Time   CHOL 132 01/11/2021 0223   TRIG 136 01/11/2021 0223   HDL 41 01/11/2021 0223   CHOLHDL 3.2 01/11/2021 0223   VLDL 27 01/11/2021 0223   LDLCALC 64 01/11/2021 0223    Physical Exam:    VS:  BP 136/70   Pulse 65   Ht 5\' 4"  (1.626 m)   Wt 195 lb 3.2 oz (88.5 kg)   SpO2 95%   BMI 33.51 kg/m     Wt Readings from Last 3 Encounters:  08/30/21 195 lb 3.2 oz (88.5 kg)  08/27/21 191 lb 9.3 oz (86.9 kg)  05/11/21 191 lb (86.6 kg)     GEN:  Well nourished, well developed in no acute distress.  Obese HEENT: Normal NECK: No JVD; No carotid bruits LYMPHATICS: No lymphadenopathy CARDIAC: RRR, no murmurs, rubs, gallops RESPIRATORY:   Clear to auscultation without rales, wheezing or rhonchi  ABDOMEN: Soft, non-tender, non-distended MUSCULOSKELETAL:  No edema; No deformity  SKIN: Warm and dry NEUROLOGIC:  Alert and oriented x 3 PSYCHIATRIC:  Normal affect       ASSESSMENT:    1. Atrial flutter, unspecified type (HCC)   2. Coronary artery disease involving native coronary artery of native heart with angina pectoris (HCC)   3. Chronic diastolic CHF (congestive heart failure) (HCC)   4. Primary hypertension    PLAN:    In order of problems listed above:  #Atrial flutter Unclear burden of atrial flutter.  Also unclear to me as if there is a link between her previous atrial flutter episodes and any symptoms that she is experiencing.  It is possible that her chest discomfort is prompted by episodes of atrial flutter.  I would like to tease this out a little bit more with her wearing a 2-week ZIO monitor to better assess for the burden of atrial flutter and whether or not there is any correlation of the atrial flutter episodes and symptoms.  For now continue Eliquis for stroke prophylaxis.  Continue metoprolol.  #Chest pain #Coronary artery disease The patient has a history of significant coronary artery disease involving the LAD.  I reviewed her cath films today with Dr. Clifton James.  Given her recent ER presentation, favor a functional study to assess for any evidence of ischemia in this territory.  I will order a exercise nuclear stress test.  I would like her to follow-up in 6 to 8 weeks to review the results of the above.  #Hypertension Controlled.  Continue current medication regimen.   Follow-up 6 to 8 weeks.  Medication Adjustments/Labs and Tests Ordered: Current medicines are reviewed at length with the patient today.  Concerns regarding medicines are outlined above.  Orders Placed This Encounter  Procedures   LONG TERM MONITOR (3-14 DAYS)   MYOCARDIAL PERFUSION IMAGING   EKG 12-Lead   No orders of the  defined types were placed in this encounter.    Signed, Rossie Muskrat. Lalla Brothers, MD, [facility], [facility] 08/30/2021 7:43 PM    Electrophysiology [facility]

## 2021-08-30 NOTE — Patient Instructions (Signed)
Medication Instructions:  Your physician recommends that you continue on your current medications as directed. Please refer to the Current Medication list given to you today.  *If you need a refill on your cardiac medications before your next appointment, please call your pharmacy*   Lab Work: None  If you have labs (blood work) drawn today and your tests are completely normal, you will receive your results only by: MyChart Message (if you have MyChart) OR A paper copy in the mail If you have any lab test that is abnormal or we need to change your treatment, we will call you to review the results.   Testing/Procedures: Your physician has requested that you have an exercise stress myoview. For further information please visit [email or web address] Please follow instruction sheet, as given.  Your physician has recommended that you wear a 14 day ZIO monitor. These monitors are medical devices that record the heart's electrical activity. Doctors most often use these monitors to diagnose arrhythmias. Arrhythmias are problems with the speed or rhythm of the heartbeat. The monitor is a small, portable device. You can wear one while you do your normal daily activities. This is usually used to diagnose what is causing palpitations/syncope (passing out).   Follow-Up: At [facility], you and your health needs are our priority.  As part of our continuing mission to provide you with exceptional heart care, we have created designated Provider Care Teams.  These Care Teams include your primary Cardiologist (physician) and Advanced Practice Providers (APPs -  Physician Assistants and Nurse Practitioners) who all work together to provide you with the care you need, when you need it.  We recommend signing up for the patient portal called "MyChart".  Sign up information is provided on this After Visit Summary.  MyChart is used to connect with patients for Virtual Visits (Telemedicine).  Patients are able to  view lab/test results, encounter notes, upcoming appointments, etc.  Non-urgent messages can be sent to your provider as well.   To learn more about what you can do with MyChart, go to [email or web address].    Your next appointment:   8 week(s)  The format for your next appointment:   In Person  Provider:   Steffanie [name], MD{   Other Instructions ZIO XT- Long Term Monitor Instructions  Your physician has requested you wear a ZIO patch monitor for 14 days.  This is a single patch monitor. [employer] supplies one patch monitor per enrollment. Additional stickers are not available. Please do not apply patch if you will be having a Nuclear Stress Test,  Echocardiogram, Cardiac CT, MRI, or Chest Xray during the period you would be wearing the  monitor. The patch cannot be worn during these tests. You cannot remove and re-apply the  ZIO XT patch monitor.  Your ZIO patch monitor will be mailed 3 day USPS to your address on file. It may take 3-5 days  to receive your monitor after you have been enrolled.  Once you have received your monitor, please review the enclosed instructions. Your monitor  has already been registered assigning a specific monitor serial # to you.  Billing and Patient Assistance Program Information  We have supplied [employer] with any of your insurance information on file for billing purposes. [employer] offers a sliding scale Patient Assistance Program for patients that do not have  insurance, or whose insurance does not completely cover the cost of the ZIO monitor.  You must apply for the Patient Assistance Program to qualify for this discounted  rate.  To apply, please call [employer] at [phone], select option 4, select option 2, ask to apply for  Patient Assistance Program. Meredeth Ide will ask your household income, and how many people  are in your household. They will quote your out-of-pocket cost based on that information.  [employer] will also be able to set up a  15-month, interest-free payment plan if needed.  Applying the monitor   Shave hair from upper left chest.  Hold abrader disc by orange tab. Rub abrader in 40 strokes over the upper left chest as  indicated in your monitor instructions.  Clean area with 4 enclosed alcohol pads. Let dry.  Apply patch as indicated in monitor instructions. Patch will be placed under collarbone on left  side of chest with arrow pointing upward.  Rub patch adhesive wings for 2 minutes. Remove white label marked "1". Remove the white  label marked "2". Rub patch adhesive wings for 2 additional minutes.  While looking in a mirror, press and release button in center of patch. A small green light will  flash 3-4 times. This will be your only indicator that the monitor has been turned on.  Do not shower for the first 24 hours. You may shower after the first 24 hours.  Press the button if you feel a symptom. You will hear a small click. Record Date, Time and  Symptom in the Patient Logbook.  When you are ready to remove the patch, follow instructions on the last 2 pages of Patient  Logbook. Stick patch monitor onto the last page of Patient Logbook.  Place Patient Logbook in the blue and white box. Use locking tab on box and tape box closed  securely. The blue and white box has prepaid postage on it. Please place it in the mailbox as  soon as possible. Your physician should have your test results approximately 7 days after the  monitor has been mailed back to [facility].  Call [facility] Customer Care at [phone] if you have questions regarding  your ZIO XT patch monitor. Call them immediately if you see an orange light blinking on your  monitor.  If your monitor falls off in less than 4 days, contact our Monitor department at [phone].  If your monitor becomes loose or falls off after 4 days call [employer] at [phone] for  suggestions on securing your monitor   Important Information About  [place]

## 2021-09-01 ENCOUNTER — Telehealth: Payer: Self-pay | Admitting: Internal Medicine

## 2021-09-01 NOTE — Telephone Encounter (Signed)
Pt would like a callback regarding appt. Please advise

## 2021-09-04 NOTE — Telephone Encounter (Signed)
Left patient detailed message regarding change: Patients test changed to [place] per Dr. Quentin Ore. Same day, same time as previous test that was scheduled.

## 2021-09-04 NOTE — Telephone Encounter (Signed)
Called pt advised to keep 10/10/21 OV with DD.  F/U with Dr. [name] will be based on this OV.  Pt questions dx of CHF per Dr. [name] no evidence of CHF noted.  Pt also worried that can not walk for Exercise NMST.  Will route to [place], RN for Dr. Lalla Brothers to have testing changed to [facility].

## 2021-09-04 NOTE — Addendum Note (Signed)
Addended by: Darrell Jewel on: 09/04/2021 05:09 PM   Modules accepted: Orders

## 2021-09-04 NOTE — Telephone Encounter (Signed)
Lexiscan ok.  Thanks,  Steffanie [name]

## 2021-09-04 NOTE — Telephone Encounter (Signed)
Moved appt for patient with [name] to different day per patient request. Appt now 7/11 at 3:10 pm. Patient would like to confirm appt is still needed with [name] since she has lots of testing coming up and an appt with Dr. Lalla Brothers and then Dr. [name]. Will forward to [facility] to address as this was a general cardiology follow up.

## 2021-09-06 DIAGNOSIS — M533 Sacrococcygeal disorders, not elsewhere classified: Secondary | ICD-10-CM | POA: Diagnosis not present

## 2021-09-06 NOTE — Addendum Note (Signed)
Addended by: Steffanie [name] T on: 09/06/2021 08:04 AM   Modules accepted: Orders

## 2021-09-13 ENCOUNTER — Telehealth: Payer: Self-pay | Admitting: Cardiology

## 2021-09-13 NOTE — Telephone Encounter (Signed)
New Message:      Patient would like for Inetta Fermo to call her today or first thing tomorrow morning. This is concerning her upcoming procedure.

## 2021-09-13 NOTE — Telephone Encounter (Signed)
Patient wanted to let us know that she had not placed her Zio monitor yet and wanted to make sure that it was okay to move forward with stress test. Advised okay.  Verbalized understanding and agreement.

## 2021-09-20 ENCOUNTER — Telehealth: Payer: Self-pay

## 2021-09-20 ENCOUNTER — Encounter: Payer: Self-pay | Admitting: Infectious Diseases

## 2021-09-20 NOTE — Telephone Encounter (Signed)
Detailed instructions left on the patient's answering machine. Asked to call back with any questions. S.Terasa Orsini EMTP 

## 2021-09-21 ENCOUNTER — Encounter: Payer: Self-pay | Admitting: Infectious Diseases

## 2021-09-21 ENCOUNTER — Ambulatory Visit (INDEPENDENT_AMBULATORY_CARE_PROVIDER_SITE_OTHER): Payer: Medicare Other | Admitting: Infectious Diseases

## 2021-09-21 ENCOUNTER — Other Ambulatory Visit: Payer: Self-pay

## 2021-09-21 VITALS — BP 148/81 | HR 65 | Temp 98.5°F | Resp 16 | Ht 64.0 in | Wt 195.0 lb

## 2021-09-21 DIAGNOSIS — N3090 Cystitis, unspecified without hematuria: Secondary | ICD-10-CM | POA: Diagnosis not present

## 2021-09-21 DIAGNOSIS — I10 Essential (primary) hypertension: Secondary | ICD-10-CM

## 2021-09-21 DIAGNOSIS — I200 Unstable angina: Secondary | ICD-10-CM

## 2021-09-21 NOTE — Assessment & Plan Note (Signed)
She is mildly elevated today She is adherent with her meds.

## 2021-09-21 NOTE — Assessment & Plan Note (Signed)
She is doing well Will hold on repeat rx as she jut completed 2 weeks of augmentin Asked her to call if she has sx- f/c, dysuria, odor, flank pain.... Will see her back prn for her sx.  My great appreciation to her urologist.

## 2021-09-21 NOTE — Assessment & Plan Note (Signed)
She appears to be stable today.  asx and is being w/u for GERD.

## 2021-09-22 ENCOUNTER — Ambulatory Visit: Payer: Medicare Other | Admitting: Physician Assistant

## 2021-09-23 LAB — URINE CULTURE
MICRO NUMBER:: 13560954
SPECIMEN QUALITY:: ADEQUATE

## 2021-09-26 ENCOUNTER — Ambulatory Visit: Payer: Medicare Other

## 2021-09-28 ENCOUNTER — Encounter (HOSPITAL_COMMUNITY): Payer: Medicare Other

## 2021-10-05 DIAGNOSIS — R1310 Dysphagia, unspecified: Secondary | ICD-10-CM | POA: Diagnosis not present

## 2021-10-05 DIAGNOSIS — E039 Hypothyroidism, unspecified: Secondary | ICD-10-CM | POA: Diagnosis not present

## 2021-10-05 DIAGNOSIS — I350 Nonrheumatic aortic (valve) stenosis: Secondary | ICD-10-CM | POA: Diagnosis not present

## 2021-10-05 DIAGNOSIS — I2510 Atherosclerotic heart disease of native coronary artery without angina pectoris: Secondary | ICD-10-CM | POA: Diagnosis not present

## 2021-10-05 DIAGNOSIS — R0789 Other chest pain: Secondary | ICD-10-CM | POA: Diagnosis not present

## 2021-10-06 ENCOUNTER — Telehealth: Payer: Self-pay | Admitting: Infectious Diseases

## 2021-10-06 NOTE — Telephone Encounter (Signed)
Dr. Ninetta Lights pt calling  in regards to having bladder problems, a lot of frequency.  Pt reports she has called [facility] already  Please call the patient back.    Pt states she was given the [facility] number  [phone] to call.

## 2021-10-10 ENCOUNTER — Other Ambulatory Visit: Payer: Self-pay | Admitting: Infectious Diseases

## 2021-10-10 ENCOUNTER — Ambulatory Visit: Payer: Medicare Other | Admitting: Physician Assistant

## 2021-10-10 DIAGNOSIS — N3090 Cystitis, unspecified without hematuria: Secondary | ICD-10-CM

## 2021-10-10 MED ORDER — AMOXICILLIN-POT CLAVULANATE 875-125 MG PO TABS: 1.0000 | ORAL_TABLET | Freq: Two times a day (BID) | ORAL | 0 refills | Status: AC

## 2021-10-10 NOTE — Telephone Encounter (Signed)
Message sent to Dr. Ninetta Lights concerning patients need for an antibiotic. Medication to be sent to patient's Pharmacy this afternoon.  Patient also asking about need for an appointment next week.  Patient is all ready scheduled for appointments due to her Cardiac issues.  Would like not to have a visit next week if possible.  Will be out of the house this afternoon and would like to be called when her prescription has been sent in. Will be out of town the next couple of days.

## 2021-10-17 ENCOUNTER — Encounter: Payer: Medicare Other | Admitting: Infectious Diseases

## 2021-10-18 ENCOUNTER — Encounter (HOSPITAL_COMMUNITY): Payer: Medicare Other

## 2021-10-18 ENCOUNTER — Other Ambulatory Visit (HOSPITAL_COMMUNITY): Payer: Medicare Other

## 2021-10-20 ENCOUNTER — Ambulatory Visit: Payer: Medicare Other

## 2021-10-20 DIAGNOSIS — R252 Cramp and spasm: Secondary | ICD-10-CM | POA: Diagnosis not present

## 2021-10-20 DIAGNOSIS — I10 Essential (primary) hypertension: Secondary | ICD-10-CM | POA: Diagnosis not present

## 2021-10-23 ENCOUNTER — Ambulatory Visit (HOSPITAL_COMMUNITY): Payer: Medicare Other

## 2021-10-26 ENCOUNTER — Ambulatory Visit: Payer: Medicare Other | Admitting: Cardiology

## 2021-10-26 ENCOUNTER — Ambulatory Visit (HOSPITAL_COMMUNITY): Payer: Medicare Other

## 2021-10-26 ENCOUNTER — Encounter (HOSPITAL_COMMUNITY): Payer: Medicare Other

## 2021-10-26 ENCOUNTER — Other Ambulatory Visit (HOSPITAL_COMMUNITY): Payer: Medicare Other

## 2021-10-30 ENCOUNTER — Ambulatory Visit: Payer: Medicare Other | Admitting: Physician Assistant

## 2021-10-31 ENCOUNTER — Other Ambulatory Visit (HOSPITAL_COMMUNITY): Payer: Medicare Other

## 2021-11-07 ENCOUNTER — Ambulatory Visit: Payer: Medicare Other | Admitting: Internal Medicine

## 2021-11-07 ENCOUNTER — Other Ambulatory Visit: Payer: Self-pay | Admitting: Internal Medicine

## 2021-11-10 DIAGNOSIS — M7062 Trochanteric bursitis, left hip: Secondary | ICD-10-CM | POA: Diagnosis not present

## 2021-11-10 DIAGNOSIS — M7061 Trochanteric bursitis, right hip: Secondary | ICD-10-CM | POA: Diagnosis not present

## 2021-11-15 ENCOUNTER — Ambulatory Visit (HOSPITAL_COMMUNITY): Payer: Medicare Other

## 2021-11-15 ENCOUNTER — Ambulatory Visit: Payer: Medicare Other | Admitting: Physician Assistant

## 2021-11-23 ENCOUNTER — Ambulatory Visit: Payer: Medicare Other

## 2021-11-29 ENCOUNTER — Ambulatory Visit: Payer: Medicare Other | Admitting: Cardiology

## 2021-11-30 ENCOUNTER — Ambulatory Visit (HOSPITAL_COMMUNITY): Payer: Medicare Other

## 2021-12-05 ENCOUNTER — Ambulatory Visit (INDEPENDENT_AMBULATORY_CARE_PROVIDER_SITE_OTHER): Payer: Medicare Other | Admitting: Psychiatry

## 2021-12-05 DIAGNOSIS — M48061 Spinal stenosis, lumbar region without neurogenic claudication: Secondary | ICD-10-CM

## 2021-12-05 DIAGNOSIS — G9332 Myalgic encephalomyelitis/chronic fatigue syndrome: Secondary | ICD-10-CM

## 2021-12-05 DIAGNOSIS — M797 Fibromyalgia: Secondary | ICD-10-CM

## 2021-12-05 DIAGNOSIS — F411 Generalized anxiety disorder: Secondary | ICD-10-CM | POA: Diagnosis not present

## 2021-12-05 DIAGNOSIS — M1711 Unilateral primary osteoarthritis, right knee: Secondary | ICD-10-CM

## 2021-12-05 DIAGNOSIS — Z638 Other specified problems related to primary support group: Secondary | ICD-10-CM

## 2021-12-05 DIAGNOSIS — F4321 Adjustment disorder with depressed mood: Secondary | ICD-10-CM | POA: Diagnosis not present

## 2021-12-05 DIAGNOSIS — F4381 Prolonged grief disorder: Secondary | ICD-10-CM

## 2021-12-05 NOTE — Progress Notes (Signed)
Psychotherapy Progress Note [facility] Marliss Czar, PhD LP  Patient ID: Jennifer [name] [facility])    MRN: [id number] Therapy format: Individual psychotherapy Date: 12/05/2021      Start: 1:18p     Stop: 2:06p     Time Spent: 48 min Location: In-person   Session narrative (presenting needs, interim history, self-report of stressors and symptoms, applications of prior therapy, status changes, and interventions made in session) Hospitalized 3x past year, but says she has reliable transportation now.  ([employer] transportation, and occasional rental.)  Still has a 3rd floor apartment, and a friend Jennifer [name] who comes to help errands.  Sparse furniture, no TV, just a lot of radio listening.  Jennifer [name] died, and her daughter totalled the car she would borrow.  Says he had a "family emergency", which turns out to be over 2 years since last hearing her brother Jennifer [name] voice, the new contact.  Got a card for birthday, then a letter dated 8/8 informing her of his aggressive prostate cancer, referencing two Bible verses that seem to be portraying his fate to suffer nobly, and wistfully (or officiously?) recalling her inscription from a 32 gift devotional book about her love for him, and claiming he's tried hard to be in touch and show caring but she's walled him off.  Irritated, offended, as she has been for many years now, and seeking to figure out how to respond.  Ultimately, she would still like to have a 3-way "family therapy" meeting, presumably to vindicate herself but also some hope of breakthrough that she just doesn't believe is possible with his hardened heart and his union with a conniving, manipulative, gold digging woman.  Reviewed Jennifer [name] message, acknowledging offputting points that do sound somewhat martyring, and noted one line addressing "If you have something against me..." as an opportunity, if she wants to take it.  Fed back her own grievances, ultimately acknowledged that she  does have some, and framed the usual problem as narrowing and slowing her own message to get and keep an openminded listener first, not risk a new round of matching self-righteous complaints.  Framed an efficient possible message back to Jennifer [name] as (a) thanks for contact, (b) yes she does have a grievance, and (c) is he interested in knowing what they are, (d) if so, let's figure out a dedicated time and way.  Reviewed her wish for therapist-hosted meeting, the tension between getting emotional safety, vindication, and actual relationship results, perspective how it would look to [facility] and what it would take to recruit him to such an occasion, and likely process given the distance (e.g., phone call, the two of [place] present in the office, granting him freedom to have [place] with him, no matter how despised or feared she is).  Jennifer [name]'s ashes are still with Jennifer [name] & Jennifer [name], 8 years after her death, or at least that's what she understands to be the case, having not had the money or the mobility to complete the transaction, or inter.  Support for completing the act and encouragement getting this monkey off her back.  Therapeutic modalities: Cognitive Behavioral Therapy, Solution-Oriented/Positive Psychology, Faith-sensitive, and Assertiveness/Communication  Mental Status/Observations:  Appearance:   Neat     Behavior:  Appropriate  Motor:  Normal exc knee pain issues  Speech/Language:   Clear and Coherent  Affect:  Appropriate  Mood:  Wounded, guarded re subject, dysthymic  Thought process:  normal  Thought content:    resentment  Sensory/Perceptual disturbances:    WNL  Orientation:  Fully oriented  Attention:  Good    Concentration:  Good  Memory:  WNL  Insight:    Fair  Judgment:   Good  Impulse Control:  Good   Risk Assessment: Danger to Self: No Self-injurious Behavior: No Danger to Others: No Physical Aggression / Violence: No Duty to Warn: No Access to Firearms a concern: No  Assessment of  progress:  stabilized  Diagnosis:   ICD-10-CM   1. Generalized anxiety disorder  F41.1     2. Primary osteoarthritis of right knee  M17.11     3. Spinal stenosis, lumbar region, without neurogenic claudication  M48.061     4. 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 Relationship problem with family members  Z63.8     6. 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  Make offer to [facility] as noted, no embellishments or arguments. Follow medical advice on care of orthopedic issues Maintain good will and collaboration with any social-service agencies involved If feasible, see about finishing the task of mother's ashes.  Even if they are in a bag in a box at home, the meaning/optics are better than keeping them in a stranger's warehouse for years waiting to afford a beautiful container or the expense of interment. Other recommendations/advice as may be noted above Continue to utilize previously learned skills ad lib Maintain medication as prescribed and work faithfully with relevant prescriber(s) if any changes are desired or seem indicated Call the clinic on-call service, 988/hotline, 911, or present to [facility] or ER if any life-threatening psychiatric crisis No follow-ups on file. Already scheduled visit in this office 01/24/2022.  Robley Fries, PhD Marliss Czar, PhD LP Clinical Psychologist, [facility] [address], [address] [place], [place] [address] [phone]

## 2021-12-06 ENCOUNTER — Telehealth: Payer: Self-pay | Admitting: Internal Medicine

## 2021-12-06 NOTE — Telephone Encounter (Signed)
Pt states she has some nosebleeds, not gushing but just dripping. She states it was bright red yesterday at [employer]. She states after she got home yesterday she put pressure on bridge of her nose and it subsided but when she got up, it started bleeding again. She states its now a little discolored.

## 2021-12-06 NOTE — Telephone Encounter (Signed)
Pt reports has had some sinus drainage and has blown nose a lot.  Had bright red blood from nose last night while out to eat.  Once home pinched bridge of nose and laid down.  This morning blood is brown/red in color not bleeding as much.   Advised pt could be some trauma to nostril that is causing bleeding.  Advise if bleed occurs to pinch bridge of nose and lean forward some.  Advised to try not to forcefully blow nose to allow area in nose to repair itself.  Advised to monitor bleeding if continues or worsens to call back.  Expresses understanding no further concerns voiced.

## 2021-12-12 ENCOUNTER — Other Ambulatory Visit: Payer: Self-pay | Admitting: Internal Medicine
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
## 2021-12-14 DIAGNOSIS — Z79899 Other long term (current) drug therapy: Secondary | ICD-10-CM | POA: Diagnosis not present

## 2021-12-14 DIAGNOSIS — M7061 Trochanteric bursitis, right hip: Secondary | ICD-10-CM | POA: Diagnosis not present

## 2021-12-14 DIAGNOSIS — G894 Chronic pain syndrome: Secondary | ICD-10-CM | POA: Diagnosis not present

## 2021-12-14 DIAGNOSIS — M7062 Trochanteric bursitis, left hip: Secondary | ICD-10-CM | POA: Diagnosis not present

## 2021-12-14 DIAGNOSIS — M48062 Spinal stenosis, lumbar region with neurogenic claudication: Secondary | ICD-10-CM | POA: Diagnosis not present

## 2021-12-14 DIAGNOSIS — M533 Sacrococcygeal disorders, not elsewhere classified: Secondary | ICD-10-CM | POA: Diagnosis not present

## 2021-12-15 ENCOUNTER — Ambulatory Visit: Payer: Medicare Other | Admitting: Physician Assistant

## 2021-12-15 DIAGNOSIS — D649 Anemia, unspecified: Secondary | ICD-10-CM

## 2021-12-15 DIAGNOSIS — I4892 Unspecified atrial flutter: Secondary | ICD-10-CM

## 2021-12-15 DIAGNOSIS — I2510 Atherosclerotic heart disease of native coronary artery without angina pectoris: Secondary | ICD-10-CM

## 2021-12-15 DIAGNOSIS — I441 Atrioventricular block, second degree: Secondary | ICD-10-CM

## 2021-12-15 DIAGNOSIS — I77810 Thoracic aortic ectasia: Secondary | ICD-10-CM

## 2021-12-15 DIAGNOSIS — I10 Essential (primary) hypertension: Secondary | ICD-10-CM

## 2021-12-15 DIAGNOSIS — I350 Nonrheumatic aortic (valve) stenosis: Secondary | ICD-10-CM

## 2021-12-18 ENCOUNTER — Telehealth: Payer: Self-pay | Admitting: Internal Medicine

## 2021-12-18 NOTE — Telephone Encounter (Signed)
New Message:     Patient called and wanted Shamea to know that Sadie Haber was supposed to fax over her last lab work over on last Friday(12-15-21).Marland Kitchen

## 2021-12-19 NOTE — Telephone Encounter (Signed)
Labs reviewed by MD no changes needed.

## 2021-12-29 ENCOUNTER — Ambulatory Visit: Payer: Medicare Other

## 2021-12-31 ENCOUNTER — Encounter: Payer: Self-pay | Admitting: Physician Assistant
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
## 2022-01-03 ENCOUNTER — Ambulatory Visit: Payer: Medicare Other | Admitting: Cardiology

## 2022-01-03 ENCOUNTER — Ambulatory Visit: Payer: Medicare Other | Admitting: Physician Assistant

## 2022-01-03 ENCOUNTER — Ambulatory Visit: Payer: Medicare Other | Admitting: Internal Medicine

## 2022-01-03 DIAGNOSIS — D649 Anemia, unspecified: Secondary | ICD-10-CM

## 2022-01-03 DIAGNOSIS — I4892 Unspecified atrial flutter: Secondary | ICD-10-CM

## 2022-01-03 DIAGNOSIS — I441 Atrioventricular block, second degree: Secondary | ICD-10-CM

## 2022-01-03 DIAGNOSIS — I350 Nonrheumatic aortic (valve) stenosis: Secondary | ICD-10-CM

## 2022-01-03 DIAGNOSIS — I77810 Thoracic aortic ectasia: Secondary | ICD-10-CM

## 2022-01-03 DIAGNOSIS — I493 Ventricular premature depolarization: Secondary | ICD-10-CM

## 2022-01-03 DIAGNOSIS — I2510 Atherosclerotic heart disease of native coronary artery without angina pectoris: Secondary | ICD-10-CM

## 2022-01-03 DIAGNOSIS — I10 Essential (primary) hypertension: Secondary | ICD-10-CM

## 2022-01-05 ENCOUNTER — Telehealth (HOSPITAL_COMMUNITY): Payer: Self-pay | Admitting: *Deleted

## 2022-01-05 ENCOUNTER — Other Ambulatory Visit (HOSPITAL_COMMUNITY): Payer: Self-pay | Admitting: Cardiology

## 2022-01-05 DIAGNOSIS — R079 Chest pain, unspecified: Secondary | ICD-10-CM

## 2022-01-05 NOTE — Telephone Encounter (Signed)
Left message on voicemail per DPR in reference to upcoming appointment scheduled on 01/12/2022 at 10:15 with detailed instructions given per Myocardial Perfusion Study Information Sheet for the test. LM to arrive 15 minutes early, and that it is imperative to arrive on time for appointment to keep from having the test rescheduled. If you need to cancel or reschedule your appointment, please call the office within 24 hours of your appointment. Failure to do so may result in a cancellation of your appointment, and a $50 no show fee. Phone number given for call back for any questions.

## 2022-01-08 ENCOUNTER — Other Ambulatory Visit (HOSPITAL_COMMUNITY): Payer: Self-pay | Admitting: Cardiology

## 2022-01-08 DIAGNOSIS — R079 Chest pain, unspecified: Secondary | ICD-10-CM

## 2022-01-08 NOTE — Addendum Note (Signed)
Addended by: Saddie Benders E on: 01/08/2022 10:01 AM   Modules accepted: Orders

## 2022-01-12 ENCOUNTER — Encounter (HOSPITAL_COMMUNITY): Payer: Medicare Other

## 2022-01-12 ENCOUNTER — Ambulatory Visit (HOSPITAL_BASED_OUTPATIENT_CLINIC_OR_DEPARTMENT_OTHER): Payer: Medicare Other

## 2022-01-12 ENCOUNTER — Ambulatory Visit (HOSPITAL_COMMUNITY): Payer: Medicare Other | Attending: Cardiology

## 2022-01-12 ENCOUNTER — Other Ambulatory Visit: Payer: Self-pay | Admitting: Internal Medicine

## 2022-01-12 DIAGNOSIS — I483 Typical atrial flutter: Secondary | ICD-10-CM | POA: Insufficient documentation

## 2022-01-12 DIAGNOSIS — I2510 Atherosclerotic heart disease of native coronary artery without angina pectoris: Secondary | ICD-10-CM | POA: Insufficient documentation

## 2022-01-12 DIAGNOSIS — R079 Chest pain, unspecified: Secondary | ICD-10-CM | POA: Insufficient documentation

## 2022-01-12 DIAGNOSIS — I359 Nonrheumatic aortic valve disorder, unspecified: Secondary | ICD-10-CM | POA: Insufficient documentation

## 2022-01-12 LAB — ECHOCARDIOGRAM COMPLETE
AR max vel: 1.41 cm2
AV Area VTI: 1.59 cm2
AV Area mean vel: 1.73 cm2
AV Mean grad: 30 mmHg
AV Peak grad: 58.4 mmHg
Ao pk vel: 3.82 m/s
Area-P 1/2: 3.54 cm2
Height: 64 in
S' Lateral: 2.2 cm
Weight: 3120 oz

## 2022-01-12 LAB — MYOCARDIAL PERFUSION IMAGING
LV dias vol: 94 mL (ref 46–106)
LV sys vol: 24 mL
Nuc Stress EF: 74 %
Peak HR: 82 {beats}/min
Rest HR: 65 {beats}/min
Rest Nuclear Isotope Dose: 10.3 mCi
SDS: 4
SRS: 0
SSS: 4
ST Depression (mm): 0 mm
Stress Nuclear Isotope Dose: 29.4 mCi
TID: 1.24

## 2022-01-12 MED ORDER — TECHNETIUM TC 99M TETROFOSMIN IV KIT
10.3000 | PACK | Freq: Once | INTRAVENOUS | Status: AC | PRN
  Administered 2022-01-12: 10.3 via INTRAVENOUS

## 2022-01-12 MED ORDER — REGADENOSON 0.4 MG/5ML IV SOLN
0.4000 mg | Freq: Once | INTRAVENOUS | Status: AC
  Administered 2022-01-12: 0.4 mg via INTRAVENOUS

## 2022-01-12 MED ORDER — TECHNETIUM TC 99M TETROFOSMIN IV KIT
29.4000 | PACK | Freq: Once | INTRAVENOUS | Status: AC | PRN
  Administered 2022-01-12: 29.4 via INTRAVENOUS

## 2022-01-15 ENCOUNTER — Telehealth: Payer: Self-pay | Admitting: Cardiology

## 2022-01-15 NOTE — Telephone Encounter (Signed)
Pt returning nurses call regarding test results. Please advise 

## 2022-01-15 NOTE — Telephone Encounter (Signed)
See result encounter

## 2022-01-17 ENCOUNTER — Ambulatory Visit: Payer: Medicare Other | Admitting: Cardiology

## 2022-01-19 ENCOUNTER — Telehealth: Payer: Self-pay | Admitting: Internal Medicine

## 2022-01-19 ENCOUNTER — Ambulatory Visit: Payer: Medicare Other | Admitting: Internal Medicine

## 2022-01-19 NOTE — Telephone Encounter (Signed)
Called pt reviewed results please see result note.

## 2022-01-19 NOTE — Telephone Encounter (Signed)
Patient returned RN Shamea's call.

## 2022-01-24 ENCOUNTER — Ambulatory Visit: Payer: Self-pay | Admitting: Psychiatry

## 2022-01-25 ENCOUNTER — Ambulatory Visit: Payer: Medicare Other | Admitting: Internal Medicine

## 2022-01-31 ENCOUNTER — Ambulatory Visit: Payer: Medicare Other | Attending: Internal Medicine | Admitting: Internal Medicine

## 2022-01-31 ENCOUNTER — Encounter: Payer: Self-pay | Admitting: Internal Medicine

## 2022-01-31 VITALS — BP 148/60 | HR 60 | Ht 64.0 in | Wt 201.0 lb

## 2022-01-31 DIAGNOSIS — R0609 Other forms of dyspnea: Secondary | ICD-10-CM

## 2022-01-31 DIAGNOSIS — I25118 Atherosclerotic heart disease of native coronary artery with other forms of angina pectoris: Secondary | ICD-10-CM | POA: Diagnosis not present

## 2022-01-31 DIAGNOSIS — Z79899 Other long term (current) drug therapy: Secondary | ICD-10-CM

## 2022-01-31 DIAGNOSIS — I10 Essential (primary) hypertension: Secondary | ICD-10-CM | POA: Diagnosis not present

## 2022-01-31 DIAGNOSIS — I359 Nonrheumatic aortic valve disorder, unspecified: Secondary | ICD-10-CM

## 2022-01-31 DIAGNOSIS — I4892 Unspecified atrial flutter: Secondary | ICD-10-CM | POA: Diagnosis not present

## 2022-01-31 MED ORDER — LOSARTAN POTASSIUM 100 MG PO TABS: 100.0000 mg | ORAL_TABLET | Freq: Every day | ORAL | 3 refills | Status: DC

## 2022-01-31 NOTE — Patient Instructions (Signed)
Medication Instructions:  Your physician has recommended you make the following change in your medication:   1) INCREASE Losartan to 100mg  daily  *If you need a refill on your cardiac medications before your next appointment, please call your pharmacy*  Lab Work: In 2 weeks: BMP If you have labs (blood work) drawn today and your tests are completely normal, you will receive your results only by: [place] (if you have MyChart) OR A paper copy in the mail If you have any lab test that is abnormal or we need to change your treatment, we will call you to review the results.  Testing/Procedures: Your physician has requested that you have a myocardial amyloid imaging planar and spect scan performed.  Follow-Up: At [facility], you and your health needs are our priority.  As part of our continuing mission to provide you with exceptional heart care, we have created designated Provider Care Teams.  These Care Teams include your primary Cardiologist (physician) and Advanced Practice Providers (APPs -  Physician Assistants and Nurse Practitioners) who all work together to provide you with the care you need, when you need it.  Your next appointment:   4-5 month(s)  The format for your next appointment:   In Person  Provider:   Werner Lean, MD    Important Information About Sugar

## 2022-01-31 NOTE — Progress Notes (Signed)
Cardiology Office Note:    Date:  01/31/2022   ID:  Jennifer [name], DOB 1946-02-15, MRN [id number]  PCP:  Jarrett SohoWharton, Courtney, PA-C   [facility] Providers Cardiologist:  Christell ConstantMahesh A Dameon Soltis, MD Electrophysiologist:  Lanier PrudeAMERON T LAMBERT, MD     Referring MD: Jarrett SohoWharton, Courtney, PA-C   CC:  Progress Trial discussion  History of Present Illness:    Jennifer [name] is a 76 y.o. female with a hx of diffuse CAD not amenable to intervention, AFL, Mild AA, Moderate AS.  Chronic pain syndrome.   Known LBBB.   Found to have CAD in 2022.  Unamenable to intervention.  AS had issues with a variety of medications and advised to see allergy. 2023: Moderate AS.  Had re-assuring stress test.  Patient notes that she is doing Ok from a heart perspective.   Since last visit notes back pain issues: CVS is out of the 7.5 hydrocodone  Has been dealing with a sinus infection.  Took OTCs with some improvement. It felt like a switch and had sudden onset palpitations and SOB.  No chest pain or pressure .  No SOB/DOE and no PND/Orthopnea. Her BP was doing better with losartan 100 mg  No weight gain or leg swelling.  She comes with many questions about: HF, AS, amyloid, ATTR, dental procedures, lumbar procedures, carpal tunnel.  Answered and will reflect changes in A/P.  Past Medical History:  Diagnosis Date   Acute meniscal tear of left knee    FOLLOWED BY DR GIAFFREY   Anemia    Aortic stenosis    Ascending aorta dilation (HCC)    Atrial flutter (HCC)    AV block, Mobitz 1    BPPV (benign paroxysmal positional vertigo)    CAD (coronary artery disease)    Chronic bladder pain    Chronic fatigue syndrome    Chronic low back pain    W/ RIGHT LEG PAIN AND NUMBNESS   Cystitis, chronic    Fibromyalgia    GERD (gastroesophageal reflux disease)    Hiatal hernia    Hypertension    IC (interstitial cystitis)    LBBB (left bundle branch block)    transient hx   OSA (obstructive sleep  apnea)    per pt study yrs ago-- moderate osa ,  intolerant cpap   Pinched vertebral nerve    bilateral L2 -- L3 and L3 -- L4-/  epidural injection's treatment , PT and pain clince   PONV (postoperative nausea and vomiting)    severe   PVC's (premature ventricular contractions)    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    Self-catheterizes urinary bladder    Spinal stenosis, lumbar region with neurogenic claudication    Wears glasses    Wears partial dentures    upper and lower    Past Surgical History:  Procedure Laterality Date   ABDOMINAL AORTOGRAM N/A 09/16/2020   Procedure: ABDOMINAL AORTOGRAM;  Surgeon: Iran OuchArida, Muhammad A, MD;  Location: [facility];  Service: Cardiovascular;  Laterality: N/A;   ABDOMINAL HYSTERECTOMY  1980   w/ Left Salpingoophorectomy   BIOPSY  08/27/2021   Procedure: BIOPSY;  Surgeon: Vida RiggerMagod, Marc, MD;  [facility];  Service: Gastroenterology;;   CARDIAC CATHETERIZATION  08-21-2001  dr Verdis Primehenry smith   normal coronary arteries and LVF   CARPAL TUNNEL RELEASE Bilateral 1986   CECALCYSTOPLASTY/  [facility]CT[facility]   CHOLECYSTECTOMY  1992   CYSTO WITH HYDRODISTENSION N/A 01/12/2016   Procedure: CYSTOSCOPY/HYDRODISTENSION;  Surgeon: Jethro Bolus, MD;  Location: [facility];  Service: Urology;  Laterality: N/A;   CYSTO WITH HYDRODISTENSION N/A 08/30/2016   Procedure: CYSTOSCOPY/HYDRODISTENSION OF BLADDER INJECTION OF MARCAINE/PYRIDIUM;  Surgeon: Jethro Bolus, MD;  Location: [facility];  Service: Urology;  Laterality: N/A;   ESOPHAGOGASTRODUODENOSCOPY (EGD) WITH PROPOFOL N/A 08/27/2021   Procedure: ESOPHAGOGASTRODUODENOSCOPY (EGD) WITH PROPOFOL;  Surgeon: Vida Rigger, MD;  Location: [facility] ENDOSCOPY;  Service: Gastroenterology;  Laterality: N/A;   LEFT HEART CATH AND CORONARY ANGIOGRAPHY N/A 09/16/2020   Procedure: LEFT HEART CATH AND CORONARY ANGIOGRAPHY;   Surgeon: Iran Ouch, MD;  Location: [facility];  Service: Cardiovascular;  Laterality: N/A;   MULTIPLE CYSTO/  HYDRODISTENTION/  INSTILLATION THERAPY  last one 08-19-2009   NASAL SINUS SURGERY  1987   TONSILLECTOMY  as child   VAGINAL GROWTH REMOVED  as teen    Current Medications: Current Meds  Medication Sig   acetaminophen (TYLENOL) 500 MG tablet Take 500 mg by mouth 2 (two) times daily as needed (for pain).   apixaban (ELIQUIS) 5 MG TABS tablet Take 1 tablet (5 mg total) by mouth 2 (two) times daily.   dextromethorphan-guaiFENesin (MUCINEX DM) 30-600 MG 12hr tablet Take 1 tablet by mouth every 12 (twelve) hours as needed for cough.   fexofenadine (ALLEGRA) 60 MG tablet Take 60 mg by mouth daily as needed for allergies.   gabapentin (NEURONTIN) 300 MG capsule Take 300 mg by mouth at bedtime.   hydrALAZINE (APRESOLINE) 50 MG tablet Take 1 tablet (50 mg total) by mouth 3 (three) times daily.   HYDROcodone-acetaminophen (NORCO) 7.5-325 MG tablet Take 1 tablet by mouth every 8 (eight) hours as needed for moderate pain or severe pain.   hydrocortisone (ANUSOL-HC) 25 MG suppository Place 25 mg rectally 2 (two) times daily as needed for hemorrhoids.   isosorbide dinitrate (ISORDIL) 10 MG tablet TAKE 1 TABLET BY MOUTH THREE TIMES A DAY   losartan (COZAAR) 100 MG tablet Take 1 tablet (100 mg total) by mouth daily.   meclizine (ANTIVERT) 25 MG tablet Take 25 mg by mouth 3 (three) times daily as needed for dizziness.   Methen-Hyosc-Meth Blue-Na Phos (UROGESIC-BLUE) 81.6 MG TABS Take 1 tablet by mouth every 6 (six) hours as needed (urinary burning).   metoprolol tartrate (LOPRESSOR) 25 MG tablet Take 1 tablet (25 mg total) by mouth 2 (two) times daily.   nitroGLYCERIN (NITROSTAT) 0.4 MG SL tablet PLACE 1 TABLET UNDER THE TONGE EVERY 5 MINUTES AS NEEDED FOR CHEST PAIN *TAKE UP TO 3 TABLETS*   nystatin cream (MYCOSTATIN) Apply topically 2 (two) times daily.   ondansetron (ZOFRAN-ODT) 4  MG disintegrating tablet Take 4 mg by mouth every 8 (eight) hours as needed for nausea (dissolve orally).   polyethylene glycol powder (GLYCOLAX/MIRALAX) 17 GM/SCOOP powder Take 17 g by mouth daily as needed for mild constipation.   spironolactone (ALDACTONE) 25 MG tablet TAKE 1 TABLET (25 MG TOTAL) BY MOUTH DAILY.   sucralfate (CARAFATE) 1 g tablet Take 1 tablet (1 g total) by mouth 4 (four) times daily -  with meals and at bedtime.   triamcinolone cream (KENALOG) 0.1 % Apply 1 application. topically 2 (two) times daily as needed (rash).   [DISCONTINUED] losartan (COZAAR) 50 MG tablet Take 1 tablet (50 mg total) by mouth daily.   [DISCONTINUED] pantoprazole (PROTONIX) 40 MG tablet Take 1 tablet (40 mg total) by mouth 2 (  two) times daily before a meal.   [DISCONTINUED] promethazine (PHENERGAN) 25 MG tablet Take 25 mg by mouth every 6 (six) hours as needed for vomiting or nausea.     Allergies:   Moxifloxacin, Quinolones, Sulfa antibiotics, Furosemide, Aspirin, Doxazosin, Duloxetine, Duloxetine hcl, Hydrochlorothiazide, Ibuprofen, Nebivolol hcl, Other, Ranexa [ranolazine], Robaxin [methocarbamol], Topiramate, Xarelto [rivaroxaban], Zofran [ondansetron hcl], Codeine, Dimethyl sulfoxide, Macrodantin [nitrofurantoin macrocrystal], Nitrofurantoin, Tape, and Yellow dye   Social History   Socioeconomic History   Marital status: Single    Spouse name: none   Number of children: 0   Years of education: College   Highest education level: Not on file  Occupational History   Occupation: n/a  Tobacco Use   Smoking status: Never   Smokeless tobacco: Never  Vaping Use   Vaping Use: Never used  Substance and Sexual Activity   Alcohol use: No    Alcohol/week: 0.0 standard drinks of alcohol   Drug use: No   Sexual activity: Not on file  Other Topics Concern   Not on file  Social History Narrative   Lives alone   Laurann Montana, RN    Caffeine use: 2 glasses tea/day   Brother   Mother passed in  ?2015 at 30, father passed in 64's in 2006   She was a Pharmacist, hospital      Social Determinants of Radio broadcast assistant Strain: Low Risk  (03/15/2021)   Overall Financial Resource Strain (CARDIA)    Difficulty of Paying Living Expenses: Not hard at all  Food Insecurity: No Food Insecurity (05/18/2021)   Hunger Vital Sign    Worried About Running Out of Food in the Last Year: Never true    Ran Out of Food in the Last Year: Never true  Transportation Needs: Unmet Transportation Needs (05/15/2021)   PRAPARE - Transportation    Lack of Transportation (Medical): Yes    Lack of Transportation (Non-Medical): Yes  Physical Activity: Not on file  Stress: No Stress Concern Present (05/18/2021)   [employer]    Feeling of Stress : Only a little  Social Connections: Moderately Integrated (05/15/2021)   Social Connection and Isolation Panel [NHANES]    Frequency of Communication with Friends and Family: Twice a week    Frequency of Social Gatherings with Friends and Family: Twice a week    Attends Religious Services: 1 to 4 times per year    Active Member of [employer] or Organizations: Yes    Attends Archivist Meetings: 1 to 4 times per year    Marital Status: Never married     Family History: The patient's family history includes Alzheimer's disease in her father; Asthma in her father; CAD in her father; COPD in her father; Cancer in her brother; Colonic polyp in her mother; Emphysema in her father; Heart failure in her mother; Hypertension in her father and mother; Peripheral vascular disease in her mother; Stroke in her mother.  ROS:   Please see the history of present illness.     All other systems reviewed and are negative.  EKGs/Labs/Other Studies Reviewed:    The following studies were reviewed today:  EKG:   08/30/21; SR, inferior infarct pattern   Cardiac Studies & Procedures   CARDIAC  CATHETERIZATION  CARDIAC CATHETERIZATION 09/16/2020  Narrative  Mid LAD lesion is 50% stenosed.  3rd Diag lesion is 60% stenosed.  Dist LAD-1 lesion is 90% stenosed.  Dist LAD-2 lesion is 40% stenosed.  1.  Significant one-vessel coronary artery disease involving the mid to distal LAD at the bifurcation of the third diagonal which has also 60% ostial stenosis.  The coronary arteries are very tortuous suggestive of hypertensive heart disease. 2.  Left ventricular angiography was not performed.  EF was normal by echo. 3.  Severely elevated blood pressure with mildly elevated left ventricular end-diastolic pressure. 4.  Transient left bundle branch block after engaging the left ventricle.  This resolved by the end of the case. 5.  Abdominal aortogram was performed and showed no evidence of renal artery stenosis.  Recommendations: Unfortunately, the LAD stenosis is in a very tortuous segment and at the origin of the third diagonal branch which also has ostial disease.  PCI in this area will be high risk for dissection as well as sidebranch closure especially in the setting of uncontrolled hypertension.  In addition, the distal LAD after the stenosis is diffusely diseased. I recommend maximizing medical therapy and controlling blood pressure before considering PCI.  I added amlodipine to her antihypertensive medications. Other complicating issue is that the patient cannot tolerate aspirin long-term.  If we decide to proceed with PCI at some point, we will have to make sure she tolerates ticagrelor which can likely be used as monotherapy.  Findings Coronary Findings Diagnostic  Dominance: Right  Left Main Vessel is angiographically normal.  Left Anterior Descending Mid LAD lesion is 50% stenosed. Dist LAD-1 lesion is 90% stenosed. The lesion is mildly calcified. Dist LAD-2 lesion is 40% stenosed.  Third Diagonal Branch 3rd Diag lesion is 60% stenosed.  Left Circumflex The vessel  exhibits minimal luminal irregularities.  Right Coronary Artery Vessel is large. Vessel is angiographically normal.  Intervention  No interventions have been documented.   STRESS TESTS  MYOCARDIAL PERFUSION IMAGING 01/12/2022  Narrative   The study is normal. The study is low risk.   No ST deviation was noted.   LV perfusion is normal. There is no evidence of ischemia. There is no evidence of infarction.   Left ventricular function is normal. Nuclear stress EF: 74 %. The left ventricular ejection fraction is hyperdynamic (>65%). End diastolic cavity size is mildly enlarged. End systolic cavity size is normal.   Prior study not available for comparison.   ECHOCARDIOGRAM  ECHOCARDIOGRAM COMPLETE 01/12/2022  Narrative ECHOCARDIOGRAM REPORT    Patient Name:   Jennifer [name] Date of Exam: 01/12/2022 Medical Rec #:  [id number]        Height:       64.0 in Accession #:    [id number]       Weight:       195.0 lb Date of Birth:  11-Nov-1945        BSA:          1.935 m Patient Age:    76 years         BP:           138/62 mmHg Patient Gender: F                HR:           63 bpm. Exam Location:  [address]  Procedure: 2D Echo, Cardiac Doppler, Color Doppler, Strain Analysis and 3D Echo  Indications:    I35.0 Aortic stenosis. I25.10 CAD I48.3 Atrial flutter  History:        Patient has prior history of Echocardiogram examinations. CAD, Aortic Valve Disease, Arrythmias:Atrial Flutter, Signs/Symptoms:2/6 Systolic crescendo murmur; Risk Factors:Hypertension, Dyslipidemia and Sleep  Apnea. Previous echo revealed LVEF 70%, mild LVH, trivial MR, moderate AS mean gradient 24 mm Hg, mild dilation of ascending aorta.  Sonographer:    [facility], RDCS Referring Phys: [id number] [facility] A Leanor Voris  IMPRESSIONS   1. Left ventricular ejection fraction, by estimation, is 65 to 70%. The left ventricle has normal function. The left ventricle has no regional wall motion  abnormalities. There is moderate concentric left ventricular hypertrophy. Left ventricular diastolic parameters are consistent with Grade II diastolic dysfunction (pseudonormalization). 2. Right ventricular systolic function is normal. The right ventricular size is normal. 3. Left atrial size was mildly dilated. 4. Right atrial size was mildly dilated. 5. The mitral valve is normal in structure. Trivial mitral valve regurgitation. No evidence of mitral stenosis. 6. The aortic valve is tricuspid. There is moderate calcification of the aortic valve. Aortic valve regurgitation is not visualized. Moderate aortic valve stenosis. Aortic valve area, by VTI measures 1.59 cm. Aortic valve mean gradient measures 30.0 mmHg. Aortic valve Vmax measures 3.82 m/s. 7. Aortic dilatation noted. There is mild dilatation of the ascending aorta, measuring 41 mm. 8. The inferior vena cava is normal in size with greater than 50% respiratory variability, suggesting right atrial pressure of 3 mmHg.  FINDINGS Left Ventricle: Left ventricular ejection fraction, by estimation, is 65 to 70%. The left ventricle has normal function. The left ventricle has no regional wall motion abnormalities. The left ventricular internal cavity size was normal in size. There is moderate concentric left ventricular hypertrophy. Left ventricular diastolic parameters are consistent with Grade II diastolic dysfunction (pseudonormalization).  Right Ventricle: The right ventricular size is normal. No increase in right ventricular wall thickness. Right ventricular systolic function is normal.  Left Atrium: Left atrial size was mildly dilated.  Right Atrium: Right atrial size was mildly dilated.  Pericardium: There is no evidence of pericardial effusion.  Mitral Valve: The mitral valve is normal in structure. Trivial mitral valve regurgitation. No evidence of mitral valve stenosis.  Tricuspid Valve: The tricuspid valve is normal in structure.  Tricuspid valve regurgitation is trivial. No evidence of tricuspid stenosis.  Aortic Valve: The aortic valve is tricuspid. There is moderate calcification of the aortic valve. Aortic valve regurgitation is not visualized. Moderate aortic stenosis is present. Aortic valve mean gradient measures 30.0 mmHg. Aortic valve peak gradient measures 58.4 mmHg. Aortic valve area, by VTI measures 1.59 cm.  Pulmonic Valve: The pulmonic valve was normal in structure. Pulmonic valve regurgitation is trivial. No evidence of pulmonic stenosis.  Aorta: Aortic dilatation noted. There is mild dilatation of the ascending aorta, measuring 41 mm.  Venous: The inferior vena cava is normal in size with greater than 50% respiratory variability, suggesting right atrial pressure of 3 mmHg.  IAS/Shunts: No atrial level shunt detected by color flow Doppler.   LEFT VENTRICLE PLAX 2D LVIDd:         4.20 cm   Diastology LVIDs:         2.20 cm   LV e' medial:    7.94 cm/s LV PW:         1.10 cm   LV E/e' medial:  13.7 LV IVS:        1.50 cm   LV e' lateral:   7.40 cm/s LVOT diam:     2.20 cm   LV E/e' lateral: 14.7 LV SV:         138 LV SV Index:   71        2D Longitudinal  Strain LVOT Area:     3.80 cm  2D Strain GLS (A2C):   -23.8 % 2D Strain GLS (A3C):   -21.7 % 2D Strain GLS (A4C):   -25.9 % 2D Strain GLS Avg:     -23.8 %  3D Volume EF: 3D EF:        63 % LV EDV:       145 ml LV ESV:       54 ml LV SV:        91 ml  RIGHT VENTRICLE RV Basal diam:  3.70 cm RV Mid diam:    3.10 cm RV S prime:     17.70 cm/s TAPSE (M-mode): 3.6 cm  LEFT ATRIUM             Index        RIGHT ATRIUM           Index LA diam:        4.30 cm 2.22 cm/m   RA Area:     15.30 cm LA Vol (A2C):   65.9 ml 34.05 ml/m  RA Volume:   36.10 ml  18.65 ml/m LA Vol (A4C):   52.3 ml 27.02 ml/m LA Biplane Vol: 59.7 ml 30.85 ml/m AORTIC VALVE AV Area (Vmax):    1.41 cm AV Area (Vmean):   1.73 cm AV Area (VTI):     1.59 cm AV  Vmax:           382.00 cm/s AV Vmean:          205.200 cm/s AV VTI:            0.867 m AV Peak Grad:      58.4 mmHg AV Mean Grad:      30.0 mmHg LVOT Vmax:         142.00 cm/s LVOT Vmean:        93.500 cm/s LVOT VTI:          0.363 m LVOT/AV VTI ratio: 0.42  AORTA Ao Root diam: 3.10 cm Ao Asc diam:  4.20 cm  MITRAL VALVE MV Area (PHT): 3.54 cm     SHUNTS MV Decel Time: 214 msec     Systemic VTI:  0.36 m MV E velocity: 109.00 cm/s  Systemic Diam: 2.20 cm MV A velocity: 118.00 cm/s MV E/A ratio:  0.92  Arvilla Meres MD Electronically signed by Arvilla Meres MD Signature Date/Time: 01/12/2022/2:39:56 PM    Final     CT SCANS  CT CORONARY MORPH W/CTA COR W/SCORE 09/15/2020  Addendum 09/15/2020  6:28 PM ADDENDUM REPORT: 09/15/2020 18:26  CLINICAL DATA:  76 Year-old Caucasian Female  EXAM: Cardiac/Coronary  CTA  TECHNIQUE: The patient was scanned on a [employer].  FINDINGS: A 100 kV prospective scan was triggered in the descending thoracic aorta at 111 HU's. Axial non-contrast 3 mm slices were carried out through the heart. The data set was analyzed on a dedicated work station and scored using the Agatson method. Gantry rotation speed was 250 msecs and collimation was .6 mm. No beta blockade and 0.8 mg of sl NTG was given. The 3D data set was reconstructed in 5% intervals of the 67-82 % of the R-R cycle. Diastolic phases were analyzed on a dedicated work station using MPR, MIP and VRT modes. The patient received 100 cc of contrast.  Aorta: Ascending aorta is mildly dilated for age and body surface area: 40 mm. No calcifications in cardiac field of view. No dissection.  Main  Pulmonary Artery: Mild dilation of the pulmonary artery: 32 mm with artery to aorta ratio of < 0.9.  Aortic Valve:  Tri-leaflet.  Mild annular calcification.  Coronary Arteries:  Normal coronary origin.  Right dominance.  Coronary Calcium Score:  Left main: 0  Left  anterior descending artery: 156  Left circumflex artery: 0  Right coronary artery: 1  Total: 157  Percentile: 74th for age, sex, and race matched control.  RCA is a large dominant artery that gives rise to PDA and PLA. There is no plaque.  Left main is a large artery that gives rise to LAD and LCX arteries. There is no plaque.  LAD is a large vessel that gives rise to one small D1 Branch. There is a mild non obstructive stenosis (25-49%) calcific plaque in the proximal LAD. There is a moderate stenosis (50-70%) calcific plaque in the mid LAD. There is a moderate stenosis (50-70%) calcific plaque in the distal LAD.  LCX is a non-dominant artery.  There is no plaque.  There is a ramus intermedius vessel with minimal non-obstructive (1-24%) soft plaque in the mid vessel.  Other findings:  Normal pulmonary vein drainage into the left atrium.  Normal left atrial appendage without a thrombus.  Cannot exclude small patent foramen ovale.  Extra-cardiac findings: See attached radiology report for non-cardiac structures.  IMPRESSION: 1. Coronary calcium score of 157. This was 74th percentile for age, sex, and race matched control.  2. Normal coronary origin with right dominance.  3. CAD-RADS 3. Moderate stenosis. Consider symptom-guided anti-ischemic pharmacotherapy as well as risk factor modification per guideline directed care. Additional analysis with CT FFR will be submitted.  4. Ascending aorta is mildly dilation for age and body surface area: 40 mm.  5. Mild dilation of the pulmonary artery.  6. Cannot exclude small patent foramen ovale.  Reaching out to primary team.  RECOMMENDATIONS:  Coronary artery calcium (CAC) score is a strong predictor of incident coronary heart disease (CHD) and provides predictive information beyond traditional risk factors. CAC scoring is reasonable to use in the decision to withhold, postpone, or initiate statin therapy in  intermediate-risk or selected borderline-risk asymptomatic adults (age 61-75 years and LDL-C >=70 to <190 mg/dL) who do not have diabetes or established atherosclerotic cardiovascular disease (ASCVD).* In intermediate-risk (10-year ASCVD risk >=7.5% to <20%) adults or selected borderline-risk (10-year ASCVD risk >=5% to <7.5%) adults in whom a CAC score is measured for the purpose of making a treatment decision the following recommendations have been made:  If CAC = 0, it is reasonable to withhold statin therapy and reassess in 5 to 10 years, as long as higher risk conditions are absent (diabetes mellitus, family history of premature CHD in first degree relatives (males <55 years; females <65 years), cigarette smoking, LDL >=190 mg/dL or other independent risk factors).  If CAC is 1 to 99, it is reasonable to initiate statin therapy for patients ?74 years of age.  If CAC is >=100 or >=75th percentile, it is reasonable to initiate statin therapy at any age.  Cardiology referral should be considered for patients with CAC scores =400 or >=75th percentile.  *2018 AHA/ACC/AACVPR/AAPA/ABC/ACPM/ADA/AGS/APhA/ASPC/NLA/PCNA Guideline on the Management of Blood Cholesterol: A Report of the American College of Cardiology/American Heart Association Task Force on Clinical Practice Guidelines. J Am Coll Cardiol. 2019;73(24):3168-3209.  Riley Lam, MD   Electronically Signed By: Riley Lam MD On: 09/15/2020 18:26  Narrative EXAM: OVER-READ INTERPRETATION  CT CHEST  The following report is an  over-read performed by radiologist Dr. Jeronimo Greaves of [facility] on 09/15/2020. This over-read does not include interpretation of cardiac or coronary anatomy or pathology. The coronary CTA interpretation by the cardiologist is attached.  COMPARISON:  Chest radiograph of earlier today.  FINDINGS: Vascular: Upper normal ascending aortic caliber, including 3.9 cm  on 09/10. Aortic atherosclerosis. No central pulmonary embolism, on this non-dedicated study.  Mediastinum/Nodes: No imaged thoracic adenopathy. Tiny hiatal hernia.  Lungs/Pleura: No pleural fluid.  Clear imaged lungs.  Upper Abdomen: Moderate hepatic steatosis. Normal imaged portions of the spleen.  Musculoskeletal: Mild-to-moderate convex right lower thoracic spinal curvature.  IMPRESSION: 1. No acute findings in the imaged extracardiac chest. 2. Hepatic steatosis. 3. Tiny hiatal hernia. 4. Aortic Atherosclerosis (ICD10-I70.0).  Electronically Signed: By: Jeronimo Greaves M.D. On: 09/15/2020 15:51           Recent Labs: 08/24/2021: ALT 19 08/27/2021: BUN 7; Creatinine, Ser 0.95; Magnesium 2.2; Potassium 4.2; Sodium 139 08/28/2021: Hemoglobin 9.2; Platelets 249  Recent Lipid Panel    Component Value Date/Time   CHOL 132 01/11/2021 0223   TRIG 136 01/11/2021 0223   HDL 41 01/11/2021 0223   CHOLHDL 3.2 01/11/2021 0223   VLDL 27 01/11/2021 0223   LDLCALC 64 01/11/2021 0223    Physical Exam:    VS:  BP (!) 148/60   Pulse 60   Ht 5\' 4"  (1.626 m)   Wt 201 lb (91.2 kg)   SpO2 97%   BMI 34.50 kg/m     Wt Readings from Last 3 Encounters:  01/31/22 201 lb (91.2 kg)  01/12/22 195 lb (88.5 kg)  09/21/21 195 lb (88.5 kg)    GEN:  Well nourished, well developed in no acute distress HEENT: Normal NECK: No JVD LYMPHATICS: No lymphadenopathy CARDIAC: RRR, harsh systolic murmur with no rubs, gallops RESPIRATORY:  Clear to auscultation without rales, wheezing or rhonchi  ABDOMEN: Soft, non-tender, non-distended MUSCULOSKELETAL:  No edema; No deformity  SKIN: Warm and dry NEUROLOGIC:  Alert and oriented x 3 PSYCHIATRIC:  Normal affect   ASSESSMENT:    1. DOE (dyspnea on exertion)   2. Medication management   3. Aortic valve disorder   4. Coronary artery disease of native artery of native heart with stable angina pectoris (HCC)   5. Atrial flutter, unspecified type  (HCC)   6. Primary hypertension    PLAN:    AFL  - on eliquis with Xarleto allergy - pending heart monitor and EP eval for ablation  - continue low dose tartrate  HTN - With Back Pain (Pending pain medicine eval) - Continue current meds except increase ARB to 100 mg, and labs in 2-3 weeks  Coronary Artery Disease; Obstructive/Nonobstructive Aortic atherosclerosis - LDL at gaol  - has ASA allergy of only nausea - no angina no nitro use - continue current therapies  DOE Moderate AS, with LSS, carpal tunnel, GI issues - will get PYP scan - if positive or equivocal CMR and genetic testing before starting therapy; we have discussed cost - defer Progress trial at this time  Will see post ablation      Time Spent Directly with Patient:   I have spent a total of 40 minutes with the patient reviewing notes, imaging, EKGs, labs and examining the patient as well as establishing an assessment and plan that was discussed personally with the patient.  > 50% of time was spent in direct patient care & reviewing imaging with patient .   Medication Adjustments/Labs  and Tests Ordered: Current medicines are reviewed at length with the patient today.  Concerns regarding medicines are outlined above.  Orders Placed This Encounter  Procedures   Basic metabolic panel   MYOCARDIAL AMYLOID IMAGING PLANAR AND SPECT   EKG 12-Lead   Meds ordered this encounter  Medications   losartan (COZAAR) 100 MG tablet    Sig: Take 1 tablet (100 mg total) by mouth daily.    Dispense:  90 tablet    Refill:  3    Dose change (increased).    Patient Instructions  Medication Instructions:  Your physician has recommended you make the following change in your medication:   1) INCREASE Losartan to 100mg  daily  *If you need a refill on your cardiac medications before your next appointment, please call your pharmacy*  Lab Work: In 2 weeks: BMP If you have labs (blood work) drawn today and your tests are  completely normal, you will receive your results only by: MyChart Message (if you have MyChart) OR A paper copy in the mail If you have any lab test that is abnormal or we need to change your treatment, we will call you to review the results.  Testing/Procedures: Your physician has requested that you have a myocardial amyloid imaging planar and spect scan performed.  Follow-Up: At [facility], you and your health needs are our priority.  As part of our continuing mission to provide you with exceptional heart care, we have created designated Provider Care Teams.  These Care Teams include your primary Cardiologist (physician) and Advanced Practice Providers (APPs -  Physician Assistants and Nurse Practitioners) who all work together to provide you with the care you need, when you need it.  Your next appointment:   4-5 month(s)  The format for your next appointment:   In Person  Provider:   [facility], MD    Important Information About Sugar         Signed, Christell Constant, MD  01/31/2022 5:07 PM    [facility]

## 2022-02-01 ENCOUNTER — Ambulatory Visit: Payer: Medicaid Other | Admitting: Psychiatry

## 2022-02-06 ENCOUNTER — Telehealth: Payer: Self-pay | Admitting: Internal Medicine

## 2022-02-06 DIAGNOSIS — I2510 Atherosclerotic heart disease of native coronary artery without angina pectoris: Secondary | ICD-10-CM

## 2022-02-06 NOTE — Telephone Encounter (Signed)
Patient is requesting to speak with Elza Rafter, RN regarding a recent test. She states she just a few general questions for her that she would prefer to discuss directly with RN when she returns to the office.

## 2022-02-07 NOTE — Telephone Encounter (Signed)
Left a message to call back.

## 2022-02-07 NOTE — Telephone Encounter (Signed)
Patient returning call.

## 2022-02-07 NOTE — Telephone Encounter (Signed)
Spoke with pt wants to know if needs injectable medication for cholesterol.  Last LDL obtained from 01/11/22 64.  No further results noted in pt chart.  Pt will be in office on 02/16/22.  Does pt need a direct LDL drawn on 02/16/22 or FLP?

## 2022-02-08 ENCOUNTER — Ambulatory Visit: Payer: Medicaid Other | Admitting: Psychiatry

## 2022-02-09 NOTE — Telephone Encounter (Signed)
Patient was returning call. Please advise ?

## 2022-02-09 NOTE — Telephone Encounter (Signed)
Called pt back addressed concerns about LDL direct.  MD expressed testing is accurate pt will have testing completed on 02/16/22.  Changed f/u OV with MD to 07/05/21 at 1:20 pm.  All questions answered.

## 2022-02-09 NOTE — Telephone Encounter (Signed)
Will add direct LDL to labs to be drawn on 02/16/22.  Last noted LDL from 2022. Left a message with updated information.

## 2022-02-13 DIAGNOSIS — Z79899 Other long term (current) drug therapy: Secondary | ICD-10-CM | POA: Diagnosis not present

## 2022-02-13 DIAGNOSIS — M7062 Trochanteric bursitis, left hip: Secondary | ICD-10-CM | POA: Diagnosis not present

## 2022-02-13 DIAGNOSIS — G894 Chronic pain syndrome: Secondary | ICD-10-CM | POA: Diagnosis not present

## 2022-02-13 DIAGNOSIS — M533 Sacrococcygeal disorders, not elsewhere classified: Secondary | ICD-10-CM | POA: Diagnosis not present

## 2022-02-13 DIAGNOSIS — M7061 Trochanteric bursitis, right hip: Secondary | ICD-10-CM | POA: Diagnosis not present

## 2022-02-13 DIAGNOSIS — M48062 Spinal stenosis, lumbar region with neurogenic claudication: Secondary | ICD-10-CM | POA: Diagnosis not present

## 2022-02-14 ENCOUNTER — Telehealth: Payer: Self-pay | Admitting: Internal Medicine

## 2022-02-14 NOTE — Telephone Encounter (Signed)
The patient's pain mgmt clinic is going to put her on this new medication Belbuca. She would like to discuss it with the nurse before starting it. She has some concerns about the side effects.

## 2022-02-14 NOTE — Telephone Encounter (Signed)
Left a message to call back.

## 2022-02-15 ENCOUNTER — Telehealth (HOSPITAL_COMMUNITY): Payer: Self-pay | Admitting: *Deleted

## 2022-02-15 ENCOUNTER — Other Ambulatory Visit: Payer: Medicare Other

## 2022-02-15 ENCOUNTER — Encounter (HOSPITAL_COMMUNITY): Payer: Medicare Other

## 2022-02-15 NOTE — Telephone Encounter (Signed)
I spoke with patient. I will be out of the office tomorrow, but she is ok with seeing Vaishali.

## 2022-02-15 NOTE — Telephone Encounter (Signed)
Spoke with pt for a lengthy amount of time Pt has several health issues and was calling re the possible start Belbucca on Sunday and the potential side effects  Instructed that taking med is up to self as benefit may out way the risk Per pt has appt with Pharmacy tomorrow will discuss at that time Pt also complained of bloating and was asking about Spironolactone if was diuretic and pt was told it is Pt currently on 25 mg did not know if could increase or possibly get script for Lasix has taken that in past as well Pt has noted not eating as well and may have increased salt intake Pt will try and moniot that Will forward to Dr [name] and Pharmacist as well for review .[name]

## 2022-02-15 NOTE — Telephone Encounter (Signed)
Left detailed voice message and number to call should she have any questions prior to her scheduled visit on 02/16/22.

## 2022-02-15 NOTE — Telephone Encounter (Signed)
Pt returning call

## 2022-02-16 ENCOUNTER — Ambulatory Visit (HOSPITAL_COMMUNITY): Payer: Medicare Other

## 2022-02-16 ENCOUNTER — Ambulatory Visit: Payer: Medicare Other

## 2022-02-19 ENCOUNTER — Telehealth: Payer: Self-pay | Admitting: Cardiology

## 2022-02-19 ENCOUNTER — Encounter: Payer: Self-pay | Admitting: Internal Medicine

## 2022-02-19 NOTE — Telephone Encounter (Signed)
error 

## 2022-02-19 NOTE — Telephone Encounter (Signed)
Pt c/o medication issue:  1. Name of Medication: Belbuca  2. How are you currently taking this medication (dosage and times per day)?   3. Are you having a reaction (difficulty breathing--STAT)?   4. What is your medication issue? Pt requesting call back to discuss this medication that she is being put on.

## 2022-02-20 ENCOUNTER — Other Ambulatory Visit: Payer: Self-pay | Admitting: Pharmacist

## 2022-02-20 ENCOUNTER — Telehealth: Payer: Self-pay | Admitting: Internal Medicine

## 2022-02-20 NOTE — Telephone Encounter (Signed)
Pt called [facility] T[facility] wanting to speak with Dr. Debby Bud nurse.  Pt wanted Dr. [name] and care team to know,   She is scheduled to see Pharm D on 04/26/22 at 230 pm, but be willing to see them on 11/30 since she will be here then.  Pt is taking Eliquis, but saw a commercial, and wanted to let her provider know, that she does have right leg / hip to foot numbness and tingling. ( Due to her lumbar stenosis. ) Pt is an Iced Tea drinker, and wanted to know if her caffeine intake as a tea drinker was ok? Pt states she is feeling bloated, and admits to having a sedentary lifestyle due to back pain / does not exercise, and wanted to know if there is anything can be done to help how she feels?  Ms Leyh would like a call back regarding the above concerns.  I told Ms. Vargus that I would make everyone aware of her concerns.

## 2022-02-20 NOTE — Telephone Encounter (Signed)
Patient calling to speak with [name] specifically to check a couple things. She would like a call back today or tomorrow morning while Dr. [name] is in the office if she cannot do it today. She says she would like to check a couple things prior to her further testing.

## 2022-02-20 NOTE — Telephone Encounter (Signed)
Recommend patient contact her pain management provider Christian Bowlin regarding her Belbuca and hydrocodone

## 2022-02-20 NOTE — Telephone Encounter (Signed)
Spoke with pt and advised of Jennifer [name]'s recommendation as below.  Pt states she has a call into her and she will discuss her concerns further with them.  Pt has received her heart monitor and will begin wearing it as requested by Dr Lalla Brothers.

## 2022-02-21 ENCOUNTER — Ambulatory Visit: Payer: Medicare Other | Admitting: Cardiology

## 2022-02-26 ENCOUNTER — Ambulatory Visit: Payer: Medicaid Other | Admitting: Psychiatry

## 2022-02-28 ENCOUNTER — Ambulatory Visit: Payer: Medicare Other | Admitting: Cardiology

## 2022-03-01 ENCOUNTER — Other Ambulatory Visit: Payer: Medicare Other

## 2022-03-01 ENCOUNTER — Encounter (HOSPITAL_COMMUNITY): Payer: Medicare Other

## 2022-03-02 NOTE — Telephone Encounter (Signed)
Called pt in regards to concern. Pt reports listens to talk radio and heard mention of Eliquis causing numbness and tingling.  Pt reports has numbness and tingling that goes down right leg for the past year.  Wonders if it could be r/t taking Eliquis or if it is r/t lumbar stenosis.   Advised will send to MD and pharmacist for input.

## 2022-03-06 ENCOUNTER — Ambulatory Visit: Payer: Medicaid Other | Admitting: Psychiatry

## 2022-03-07 ENCOUNTER — Other Ambulatory Visit: Payer: Self-pay | Admitting: Physician Assistant

## 2022-03-07 ENCOUNTER — Telehealth: Payer: Self-pay | Admitting: Internal Medicine

## 2022-03-07 ENCOUNTER — Ambulatory Visit: Payer: Medicare Other | Admitting: Cardiology

## 2022-03-07 ENCOUNTER — Other Ambulatory Visit: Payer: Self-pay | Admitting: Internal Medicine

## 2022-03-07 DIAGNOSIS — I4892 Unspecified atrial flutter: Secondary | ICD-10-CM

## 2022-03-07 NOTE — Telephone Encounter (Signed)
Called spoke with pt in regards to next OV.  Pt has no further needs at this time.

## 2022-03-07 NOTE — Telephone Encounter (Signed)
Pt is returning call. Transferred to Shamea, RN.  

## 2022-03-07 NOTE — Telephone Encounter (Signed)
Left a message to call back.

## 2022-03-07 NOTE — Telephone Encounter (Signed)
Pt states she is calling in regards to a personal matter and would like to only speak to her doctors nurse, Alena Bills. Requesting call back from [facility] at her earliest convenience.

## 2022-03-07 NOTE — Telephone Encounter (Signed)
Patient called to say that she will come on Tuesday, but would like for the nurse to call her on Monday for time. Please advise

## 2022-03-07 NOTE — Telephone Encounter (Signed)
Prescription refill request for Eliquis received. Indication: Aflutter Last office visit: 01/31/22 [name])  Scr: 0.94 (10/20/21)  Age: 76 Weight: 91.2kg  Appropriate dose and refill sent to requested pharmacy.

## 2022-03-08 ENCOUNTER — Encounter (HOSPITAL_COMMUNITY): Payer: Medicare Other

## 2022-03-08 ENCOUNTER — Other Ambulatory Visit: Payer: Medicare Other

## 2022-03-12 NOTE — Telephone Encounter (Signed)
Spoke with pt advised of MD and pharmacist reply that concerns are more than likely r/t Lumbar stenosis.   Pt then expresses that on some days feels bloated as if retaining fluid.  Wants to know if can increase spironolactone on these days or if can have a PRN med.   Pt does not have a scale advised to do daily weights. Will send to MD to advise.

## 2022-03-12 NOTE — Telephone Encounter (Signed)
Spoke with pt earlier today advised of MD and pharmacist response.

## 2022-03-14 ENCOUNTER — Ambulatory Visit: Payer: Medicaid Other | Admitting: Psychiatry

## 2022-03-14 DIAGNOSIS — R0609 Other forms of dyspnea: Secondary | ICD-10-CM | POA: Diagnosis not present

## 2022-03-14 DIAGNOSIS — J069 Acute upper respiratory infection, unspecified: Secondary | ICD-10-CM | POA: Diagnosis not present

## 2022-03-16 DIAGNOSIS — Z03818 Encounter for observation for suspected exposure to other biological agents ruled out: Secondary | ICD-10-CM | POA: Diagnosis not present

## 2022-03-16 DIAGNOSIS — J069 Acute upper respiratory infection, unspecified: Secondary | ICD-10-CM | POA: Diagnosis not present

## 2022-03-21 ENCOUNTER — Ambulatory Visit: Payer: Medicare Other | Admitting: Cardiology

## 2022-03-21 ENCOUNTER — Encounter (HOSPITAL_COMMUNITY): Payer: Medicare Other

## 2022-03-21 ENCOUNTER — Telehealth: Payer: Self-pay | Admitting: Internal Medicine

## 2022-03-21 ENCOUNTER — Other Ambulatory Visit: Payer: Medicare Other

## 2022-03-21 DIAGNOSIS — I2510 Atherosclerotic heart disease of native coronary artery without angina pectoris: Secondary | ICD-10-CM

## 2022-03-21 DIAGNOSIS — R0609 Other forms of dyspnea: Secondary | ICD-10-CM

## 2022-03-21 DIAGNOSIS — I359 Nonrheumatic aortic valve disorder, unspecified: Secondary | ICD-10-CM

## 2022-03-21 DIAGNOSIS — Z5982 Transportation insecurity: Secondary | ICD-10-CM

## 2022-03-21 NOTE — Telephone Encounter (Signed)
Pt called in to report difficulty getting transportation to 03/28/22 stress test appointment. Referral to SW placed.

## 2022-03-21 NOTE — Telephone Encounter (Signed)
Patient would like a call back directly from [facility].

## 2022-03-22 ENCOUNTER — Ambulatory Visit: Payer: Medicare Other | Admitting: Psychiatry

## 2022-03-22 ENCOUNTER — Telehealth: Payer: Self-pay | Admitting: Licensed Clinical Social Worker

## 2022-03-22 NOTE — Progress Notes (Signed)
Heart and Vascular Care Navigation  03/22/2022  Jennifer [name] 25-Jan-1946 [id number]  Reason for Referral:  Patient is participating in a Managed Medicaid Plan:No, traditional Medicare and Medicaid non managed.   Engaged with patient by telephone for initial visit for Heart and Vascular Care Coordination.                                                                                                   Assessment:                                     LCSW able to speak with pt today at [phone]. Introduced self, role, reason for call. Pt pleased with prompt response. She shares that she lives alone, uses Medicaid transportation for appts but unfortunately they are closed for all rides except for dialysis next week. She needs to get to [facility] . I was able to confirm home address, PCP, and emergency contacts. She shares that other than transportation she is able to access and afford her rent, utilities, food and medications. She has an extremely supportive friend who assists with delivery as needed and ordering as pt does not have a computer or cell phone.   We discussed transportation through [facility] and pt agreeable to this plan, she will be downstairs and ready 12/27 at 11:45am, if for some reason she is not feeling well or needs to cancel she understands that she can contact my colleague Eileen Stanford, [place] and was provided with numbers for [employer] and [place]. I have mailed pt my card also, for any future concerns related to Hrt/Vas needs.   No additional questions- verbal waiver reviewed and pt agreeable, she will sign hard copy when she goes to her appt.   HRT/VAS Care Coordination     Patients Home Cardiology Office [facility]   Outpatient Care Team Social Worker   Social Worker Name: Octavio Graves, [place], [phone]   Living arrangements for the past 2 months Apartment   Lives with: Self   Patient Current Insurance Coverage Traditional Medicare; Medicaid   Patient  Has Concern With Paying Medical Bills No   Does Patient Have Prescription Coverage? Yes   Home Assistive Devices/Equipment Walker (specify type)  front wheel       Social History:                                                                             SDOH Screenings   Food Insecurity: No Food Insecurity (03/22/2022)  Housing: Low Risk  (03/22/2022)  Transportation Needs: Unmet Transportation Needs (03/22/2022)  Utilities: Not At Risk (03/22/2022)  Depression (PHQ2-9): Low Risk  (09/21/2021)  Financial Resource Strain: Low Risk  (03/22/2022)  Social Connections: Moderately  Integrated (05/15/2021)  Stress: No Stress Concern Present (03/22/2022)  Tobacco Use: Low Risk  (01/31/2022)    SDOH Interventions: Financial Resources:  Financial Strain Interventions: Intervention Not Indicated  Food Insecurity:  Food Insecurity Interventions: Intervention Not Indicated  Housing Insecurity:  Housing Interventions: Intervention Not Indicated  Transportation:   Transportation Interventions: Conservation officer, nature Given    Other Care Navigation Interventions:     Patient expressed Mental Health concerns No.   Follow-up plan:   LCSW has f/u with Nuclear staff at [employer]. Zollie Scale [name] agreeable to pt waiver and return voucher along with information on how to navigate rides sent to her email at [email or web address].  Vouchers sent to [employer] w/ [employer] Will add details to appt notes. Mailed pt my card for any additional questions/concerns. I have made my colleague Eileen Stanford aware of pt upcoming rides in case of any issues.

## 2022-03-22 NOTE — Telephone Encounter (Signed)
H&V Care Navigation CSW Progress Note  Clinical Social Worker contacted patient by phone to f/u on request for transportation. No answer at [phone]. Left voicemail requesting return call. Pt returned my call, waiver verbally completed for pt. Needs ride. See full evaluation ride scheduled through [facility]. Communicated this with Nuclear team.   Patient is participating in a Managed Medicaid Plan:  No, Medicare A&B and unmanaged Medicaid.  SDOH Screenings   Food Insecurity: No Food Insecurity (05/18/2021)  Housing: Low Risk  (05/15/2021)  Transportation Needs: Unmet Transportation Needs (05/15/2021)  Depression (PHQ2-9): Low Risk  (09/21/2021)  Financial Resource Strain: Low Risk  (03/15/2021)  Social Connections: Moderately Integrated (05/15/2021)  Stress: No Stress Concern Present (05/18/2021)  Tobacco Use: Low Risk  (01/31/2022)   Octavio Graves, MSW, LCSW Clinical Social Worker II [facility] Heart/Vascular Care Navigation  [phone]- work cell phone (preferred) [phone]- desk phone    03/22/2022  Jennifer [name] DOB: [birth year] MRN: [id number]   RIDER WAIVER AND RELEASE OF LIABILITY  For the purposes of helping with transportation needs, [facility] partners with outside transportation providers (taxi companies, [place], Catering manager.) to give [employer] patients or other approved people the choice of on-demand rides [employer]") to our buildings for non-emergency visits.  By using [employer], I, the person signing this document, on behalf of myself and/or any legal minors (in my care using the [employer]), agree:  Science writer given to me are supplied by independent, outside transportation providers who do not work for, or have any affiliation with, [employer]. [employer] is not a transportation company. [employer] has no control over the quality or safety of the rides I get using [employer]. [facility] has no control over whether  any outside ride will happen on time or not. [place] gives no guarantee on the reliability, quality, safety, or availability on any rides, or that no mistakes will happen. I know and accept that traveling by vehicle (car, truck, SVU, Zenaida Niece, bus, taxi, etc.) has risks of serious injuries such as disability, being paralyzed, and death. I know and agree the risk of using [employer] is mine alone, and not [employer]. Transport Services are provided "as is" and as are available. The transportation providers are in charge for all inspections and care of the vehicles used to provide these rides. I agree not to take legal action against [employer], its agents, employees, officers, directors, representatives, insurers, attorneys, assigns, successors, subsidiaries, and affiliates at any time for any reasons related directly or indirectly to using [employer]. I also agree not to take legal action against [place] or its affiliates for any injury, death, or damage to property caused by or related to using [employer]. I have read this Waiver and Release of Liability, and I understand the terms used in it and their legal meaning. This Waiver is freely and voluntarily given with the understanding that my right (or any legal minors) to legal action against [place] relating to [employer] is knowingly given up to use these services.   I attest that I read the Ride Waiver and Release of Liability to Jennifer [name], gave Jennifer [name] the opportunity to ask questions and answered the questions asked (if any). I affirm that Jennifer [name] then provided consent for assistance with transportation.     Jennifer [name]

## 2022-03-22 NOTE — Telephone Encounter (Signed)
Pt called back stating everything has been worked out and she will be here next week for her test. She also wanted to thank you for your help.

## 2022-03-26 ENCOUNTER — Other Ambulatory Visit: Payer: Self-pay | Admitting: Physician Assistant

## 2022-03-27 ENCOUNTER — Telehealth (HOSPITAL_COMMUNITY): Payer: Self-pay | Admitting: Licensed Clinical Social Worker

## 2022-03-27 NOTE — Telephone Encounter (Signed)
Pt called CSW and informed that she has gotten ride through [employer] so no longer needs help getting to appt tomorrow.  CSW called [employer] and canceled ride- encouraged pt to reach out if something changes and we need to rebook.  Will continue to follow and assist as needed  Burna Sis, LCSW Clinical Social Worker [facility] Desk#: [phone] Cell#: [phone]

## 2022-03-28 ENCOUNTER — Ambulatory Visit (HOSPITAL_COMMUNITY): Payer: Medicare Other | Attending: Internal Medicine

## 2022-03-28 ENCOUNTER — Ambulatory Visit: Payer: Medicare Other

## 2022-03-28 DIAGNOSIS — Z79899 Other long term (current) drug therapy: Secondary | ICD-10-CM | POA: Insufficient documentation

## 2022-03-28 DIAGNOSIS — R0609 Other forms of dyspnea: Secondary | ICD-10-CM | POA: Diagnosis not present

## 2022-03-28 DIAGNOSIS — I2510 Atherosclerotic heart disease of native coronary artery without angina pectoris: Secondary | ICD-10-CM | POA: Insufficient documentation

## 2022-03-28 MED ORDER — TECHNETIUM TC 99M PYROPHOSPHATE
21.8000 | Freq: Once | INTRAVENOUS | Status: AC
  Administered 2022-03-28: 21.8 via INTRAVENOUS

## 2022-03-29 ENCOUNTER — Telehealth: Payer: Self-pay | Admitting: Internal Medicine

## 2022-03-29 LAB — BASIC METABOLIC PANEL
BUN/Creatinine Ratio: 19 (ref 12–28)
BUN: 20 mg/dL (ref 8–27)
CO2: 23 mmol/L (ref 20–29)
Calcium: 9.1 mg/dL (ref 8.7–10.3)
Chloride: 103 mmol/L (ref 96–106)
Creatinine, Ser: 1.08 mg/dL — ABNORMAL HIGH (ref 0.57–1.00)
Glucose: 91 mg/dL (ref 70–99)
Potassium: 4.9 mmol/L (ref 3.5–5.2)
Sodium: 141 mmol/L (ref 134–144)
eGFR: 54 mL/min/{1.73_m2} — ABNORMAL LOW (ref >59)

## 2022-03-29 LAB — LDL CHOLESTEROL, DIRECT: LDL Direct: 137 mg/dL — ABNORMAL HIGH (ref 0–99)

## 2022-03-29 NOTE — Telephone Encounter (Signed)
Called pt advised that once Dr. [name] has reviewed results he will give me an explanation and I will call her.  Pt thanked me for returning call.

## 2022-03-29 NOTE — Telephone Encounter (Signed)
Patient returning call.

## 2022-03-29 NOTE — Telephone Encounter (Signed)
Patient would like to speak to nurse about the test she had done yesterday.

## 2022-03-29 NOTE — Telephone Encounter (Signed)
Left a message to call back.

## 2022-03-30 ENCOUNTER — Ambulatory Visit (INDEPENDENT_AMBULATORY_CARE_PROVIDER_SITE_OTHER): Payer: Medicare Other | Admitting: Psychiatry

## 2022-03-30 DIAGNOSIS — M797 Fibromyalgia: Secondary | ICD-10-CM | POA: Diagnosis not present

## 2022-03-30 DIAGNOSIS — M25561 Pain in right knee: Secondary | ICD-10-CM

## 2022-03-30 DIAGNOSIS — Z9189 Other specified personal risk factors, not elsewhere classified: Secondary | ICD-10-CM

## 2022-03-30 DIAGNOSIS — I4892 Unspecified atrial flutter: Secondary | ICD-10-CM

## 2022-03-30 DIAGNOSIS — G9332 Myalgic encephalomyelitis/chronic fatigue syndrome: Secondary | ICD-10-CM | POA: Diagnosis not present

## 2022-03-30 DIAGNOSIS — M48061 Spinal stenosis, lumbar region without neurogenic claudication: Secondary | ICD-10-CM

## 2022-03-30 DIAGNOSIS — F411 Generalized anxiety disorder: Secondary | ICD-10-CM

## 2022-03-30 DIAGNOSIS — F4321 Adjustment disorder with depressed mood: Secondary | ICD-10-CM | POA: Diagnosis not present

## 2022-03-30 DIAGNOSIS — F4381 Prolonged grief disorder: Secondary | ICD-10-CM

## 2022-03-30 DIAGNOSIS — Z638 Other specified problems related to primary support group: Secondary | ICD-10-CM | POA: Diagnosis not present

## 2022-03-30 DIAGNOSIS — M25562 Pain in left knee: Secondary | ICD-10-CM

## 2022-03-30 NOTE — Progress Notes (Signed)
Psychotherapy Progress Note [facility] Marliss Czar, PhD LP  Patient ID: [name])    MRN: [id number] Therapy format: Individual psychotherapy Date: 03/30/2022      Start: 3:20p     Stop: 4:25p     Time Spent: 65 min Location: In-person   Session narrative (presenting needs, interim history, self-report of stressors and symptoms, applications of prior therapy, status changes, and interventions made in session) Apologetic for having had to cancel recently.  Rather than focus on Jennifer [name] this time, catches up on her cardiac issue.  Turns out earlier this year showed 222 BP at PCP's office, got referred to [facility] Urgent then [facility] ED (spiked 265), then next week had a cardiology visit followed by ED referral for similar readings.  Was admitted to cardiology unit this time, identified a blockage, could not place stent due to tortuous arteries.  (Per EHR, the issue was ID'd in 2022, 3 hosp's since then.)  Lumbar stenosis and sciatica flared up worse after the hospital trip, but very positive about treatment received.  May hospitalization included endoscopy, found pre-ulcerous conditions.  Atrial flutter has been diagnosed, yet to determine whether she needs cardioversion.  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  As noted 3 months ago, friend Jennifer [name] died a year ago, her daughter Jennifer [name] totalled the car she had been borrowing, and DSS ha provided a ride service.  Has come to make good use of mobile grocery delivery after some initial snafus.    Re. Jennifer [name], has not come back to the issue since last spoken.  Still wants to set up a phone meeting, figuring to consult again Jan 9 now.  Still on that he has aggressive prostate cancer and has been in radiation treatment.  Figures to have a phone call soon to check on his condition, hopefully exchange news, and request his willingness to a phone session to work through.   Discussed parameters, messaging before then, and keeping her own intentions straight about trying to convict him and his wife of mean behavior over 40 years or try to make more modest repairs in their working relationship.  Confronted impulses to make snide asides (e.g., how she "thought", or unrealistically expected, he would call her with news of his PET scan ... when they haven't spoken in 2.5 yrs anyway, just holiday cards) and to set an agenda that she wants to "discuss several issues" (risk of sounding highhanded and in for a longwinded recitation of grievances).    Therapeutic modalities: Cognitive Behavioral Therapy, Solution-Oriented/Positive Psychology, Ego-Supportive, Faith-sensitive, and Assertiveness/Communication  Mental Status/Observations:  Appearance:   Casual and Neat     Behavior:  Appropriate  Motor:  Normal and exc painful gait  Speech/Language:   Clear and Coherent  Affect:  Appropriate  Mood:  dysthymic  Thought process:  normal  Thought content:    Some rumination  Sensory/Perceptual disturbances:    WNL  Orientation:  Fully oriented  Attention:  Good    Concentration:  Fair  Memory:  WNL  Insight:    Fair  Judgment:   Good  Impulse Control:  Good   Risk Assessment: Danger to Self: No Self-injurious Behavior: No Danger to Others: No Physical Aggression / Violence: No Duty to Warn: No Access to Firearms a concern: No  Assessment of progress:  progressing  Diagnosis:   ICD-10-CM   1. Generalized anxiety disorder  F41.1  2. 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 At increased risk for social isolation  Z91.89     5. Spinal stenosis, lumbar region, without neurogenic claudication  M48.061     6. Arthralgia of both knees  M25.561    M25.562     7. Chronic fatigue syndrome with fibromyalgia  G93.32    M79.7     8. Atrial flutter, unspecified type (HCC)  I48.92      Plan:  OK to proceed with  arranging partially remote family session Emphasize watching tone and implications, stay ready to be declined, and stay ready to ask for listening before reciting grievances Maintain recommended self-care for pain and cardiovascular conditions Other recommendations/advice as may be noted above Continue to utilize previously learned skills ad lib Maintain medication as prescribed and work faithfully with relevant prescriber(s) if any changes are desired or seem indicated Call the clinic on-call service, 988/hotline, 911, or present to [facility] or ER if any life-threatening psychiatric crisis No follow-ups on file. Already scheduled visit in this office 04/10/2022.  Robley Fries, PhD Marliss Czar, PhD LP Clinical Psychologist, [facility] [address], [address] [place], [place] [address] [phone]

## 2022-04-05 ENCOUNTER — Telehealth: Payer: Self-pay | Admitting: Internal Medicine

## 2022-04-05 DIAGNOSIS — I43 Cardiomyopathy in diseases classified elsewhere: Secondary | ICD-10-CM

## 2022-04-05 NOTE — Telephone Encounter (Signed)
Pt asking to speak to [facility], RN about some additional questions she has regarding her latest test and MRI

## 2022-04-05 NOTE — Telephone Encounter (Signed)
Patient was returning call for results. Please advise °

## 2022-04-05 NOTE — Telephone Encounter (Signed)
Left a message to call back.

## 2022-04-05 NOTE — Telephone Encounter (Signed)
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  I would like to get this done prior to potential referral for therapy  Werner Lean, MD

## 2022-04-05 NOTE — Telephone Encounter (Signed)
Patient returning call.

## 2022-04-05 NOTE — Telephone Encounter (Signed)
The patient has been notified of the result and verbalized understanding.  All questions (if any) were answered. Precious Gilding, RN 04/05/2022 3:13 PM   Reviewed instructions for Cmri.  Cmri instructions mailed out to pt.

## 2022-04-06 NOTE — Telephone Encounter (Signed)
Reviewed BMP labs with pt.  Sent results to Dr. Alona Bene Urologist per pt request.  Pt reports has an Cmri scheduled for 05/09/22.  Pt request sooner OV to review results scheduled for 05/16/22 at 1:30 pm.

## 2022-04-10 ENCOUNTER — Ambulatory Visit: Payer: Medicare Other | Admitting: Psychiatry

## 2022-04-17 ENCOUNTER — Telehealth: Payer: Self-pay | Admitting: Internal Medicine

## 2022-04-17 NOTE — Telephone Encounter (Signed)
Pt would like a callback from nurse regarding upcoming test. Please advise 

## 2022-04-17 NOTE — Telephone Encounter (Signed)
Called pt back in regards to upcoming Cmri.  Pt reports DSS faxed over transportation forms to be filled out by provider.  No forms noted in Dr. Gasper Sells or Dr. Mardene Speak box.  Advised pt to have DSS refax to [phone]. Reviewed results of Echo, Nuclear medicine stress test and amyloid scan with pt again.  Answered all pt questions no further concerns at this time.

## 2022-04-18 ENCOUNTER — Ambulatory Visit: Payer: Medicare Other | Admitting: Cardiology

## 2022-04-19 ENCOUNTER — Telehealth: Payer: Self-pay

## 2022-04-19 NOTE — Telephone Encounter (Signed)
Medicaid Transportation Exception Form Filled out and Faxed to [employer] [phone].  Confirmation placed in nurse fax box.

## 2022-04-26 ENCOUNTER — Ambulatory Visit: Payer: Medicare Other

## 2022-05-08 ENCOUNTER — Telehealth (HOSPITAL_COMMUNITY): Payer: Self-pay | Admitting: *Deleted

## 2022-05-08 DIAGNOSIS — E854 Organ-limited amyloidosis: Secondary | ICD-10-CM

## 2022-05-08 NOTE — Telephone Encounter (Signed)
Attempted to call patient regarding upcoming cardiac MRI appointment and reminder for labs. Left message on voicemail with name and callback number  Gordy Clement RN Navigator Cardiac [facility] [phone] Office [phone] Cell

## 2022-05-08 NOTE — Telephone Encounter (Signed)
Patient returning call regarding upcoming cardiac imaging study; pt verbalizes understanding of appt date/time, parking situation and where to check in, , and verified current allergies; name and call back number provided for further questions should they arise  Gordy Clement RN Navigator Cardiac Imaging [facility] [phone] office [phone] cell  Patient denies claustrophobia but reports partial dental implant.   Patient unable to get labs prior to her MRI due to transportation issues.

## 2022-05-09 ENCOUNTER — Telehealth: Payer: Self-pay | Admitting: Internal Medicine

## 2022-05-09 ENCOUNTER — Ambulatory Visit: Payer: Medicare Other | Admitting: Cardiology

## 2022-05-09 ENCOUNTER — Ambulatory Visit: Admission: RE | Admit: 2022-05-09 | Payer: Medicare Other | Source: Ambulatory Visit

## 2022-05-09 ENCOUNTER — Other Ambulatory Visit (HOSPITAL_COMMUNITY): Payer: Self-pay | Admitting: *Deleted

## 2022-05-09 DIAGNOSIS — E854 Organ-limited amyloidosis: Secondary | ICD-10-CM

## 2022-05-09 DIAGNOSIS — I4892 Unspecified atrial flutter: Secondary | ICD-10-CM

## 2022-05-09 NOTE — Addendum Note (Signed)
Addended by: Eli Hose on: 05/09/2022 08:58 AM   Modules accepted: Orders

## 2022-05-09 NOTE — Telephone Encounter (Signed)
Called pt reports in extreme pain was not able to go to Cmri.  Pt rescheduled testing to 06/20/22.  Pt asked to cancel 05/16/22 OV with [facility].  Pt will f/u as scheduled in April.  No further questions or concerns voiced.

## 2022-05-09 NOTE — Telephone Encounter (Signed)
Pt called in today asking to speak to RN about upcoming MRI

## 2022-05-10 ENCOUNTER — Other Ambulatory Visit: Payer: Self-pay | Admitting: Internal Medicine

## 2022-05-16 ENCOUNTER — Telehealth: Payer: Self-pay | Admitting: Internal Medicine

## 2022-05-16 ENCOUNTER — Ambulatory Visit: Payer: Medicare Other | Admitting: Internal Medicine

## 2022-05-16 DIAGNOSIS — R0609 Other forms of dyspnea: Secondary | ICD-10-CM

## 2022-05-16 MED ORDER — TORSEMIDE 10 MG PO TABS: 10.0000 mg | ORAL_TABLET | Freq: Every day | ORAL | 3 refills | Status: DC

## 2022-05-16 NOTE — Telephone Encounter (Signed)
Patient was calling in to discuss medication, she states she getting build up fluid. Please advise

## 2022-05-16 NOTE — Telephone Encounter (Signed)
Spoke with MD would like pt to start torsemide 10 mg PO QD and BMP in 1 week. MD will not write a letter for lifting restrictions advised is willing to send a referral to OT.   Called pt advised of MD recommendations.  Pt will start torsemide on Friday 05/18/22.  Will come in for BMP on 05/25/22.  Will f/u with pain MD for letter for lifting restrictions.

## 2022-05-16 NOTE — Telephone Encounter (Signed)
Called pt reports has noticed swelling for the past couple of weeks.  For the past week has noticed gets more winded with activity also feels very bloated with leg and ankle swelling.  Sits down for 5-10 min and symptoms lessen.  Wants to know if can increase spironolactone or add an additional diuretic.   Has no recent BP readings.  Has a hx of reconstructive bladder surgery.  UOP is good on some days and less on others.   Pt also would like MD to write a letter saying she can not move anything heavy d/t cardiac conditions.   Will send to MD to advise.

## 2022-05-17 NOTE — Addendum Note (Signed)
Addended by: Precious Gilding on: 05/17/2022 10:04 AM   Modules accepted: Orders

## 2022-05-17 NOTE — Telephone Encounter (Signed)
Patient is following up. She states she forgot to ask RN which lab panels are needed prior to MRI. She states a call back is not necessary, but she figured she could have labs on the same day as 2/23 scheduled labs and she would like to have the order placed if possible.

## 2022-05-17 NOTE — Telephone Encounter (Signed)
Added an order for CBC for Cmri scheduled for 06/20/22.  Pt already scheduled for labs on 05/25/22.

## 2022-05-21 ENCOUNTER — Telehealth: Payer: Self-pay | Admitting: Internal Medicine

## 2022-05-21 NOTE — Telephone Encounter (Signed)
Called pt in regards to upcoming lab appointment.  Pt would like to push appointment out to 05/31/22 which is the same day as OV with pharmacy clinic.  Appointment rescheduled per pt request.

## 2022-05-21 NOTE — Telephone Encounter (Signed)
Pt would like a callback from nurse regarding labs and upcoming appt. Please advise

## 2022-05-21 NOTE — Telephone Encounter (Signed)
Left message for return call.

## 2022-05-25 ENCOUNTER — Other Ambulatory Visit: Payer: Medicare Other

## 2022-05-29 ENCOUNTER — Ambulatory Visit: Payer: Medicare Other | Admitting: Cardiology

## 2022-05-30 ENCOUNTER — Ambulatory Visit: Payer: Medicare Other | Admitting: Psychiatry

## 2022-05-30 ENCOUNTER — Ambulatory Visit: Payer: Medicare Other | Admitting: Cardiology

## 2022-05-31 ENCOUNTER — Other Ambulatory Visit: Payer: Medicare Other

## 2022-05-31 ENCOUNTER — Ambulatory Visit: Payer: Medicare Other

## 2022-06-01 IMAGING — US US RENAL
1 series · 14 of 25 positions shown · non-contrast
Comparison: None.

CLINICAL DATA: Abnormal renal function tests.

EXAM:
RENAL / URINARY TRACT ULTRASOUND COMPLETE

[Series 1: us renal · 14 of 28 slices shown]
[im 1/28]
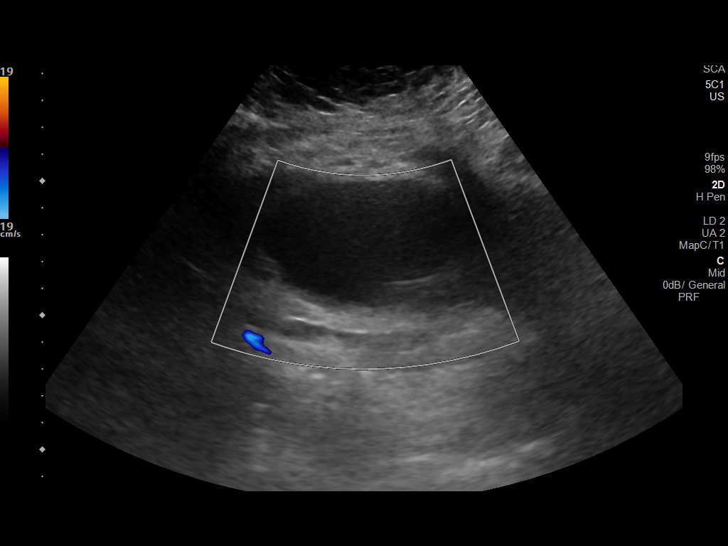
[im 3/28]
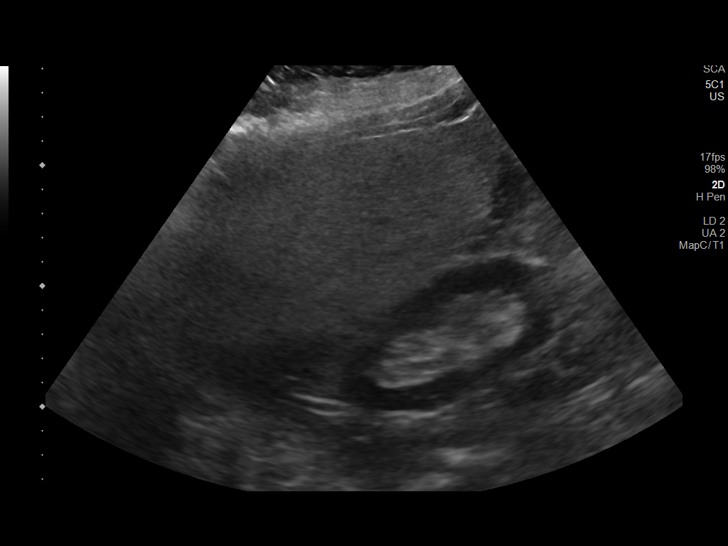
[im 5/28]
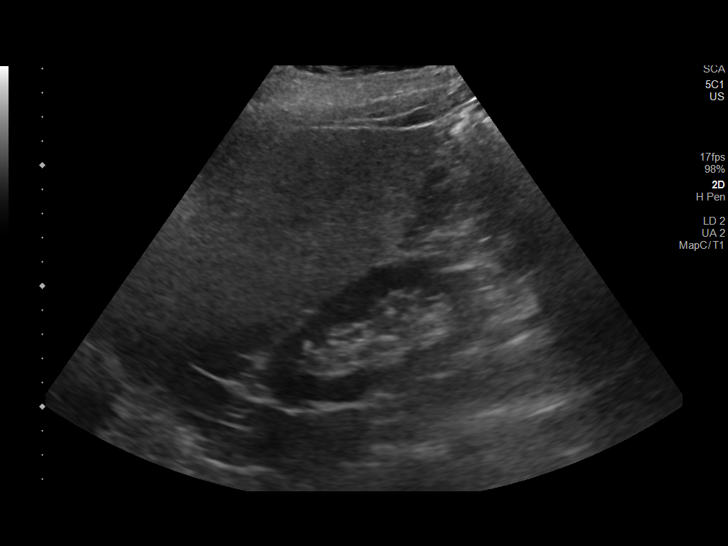
[im 7/28]
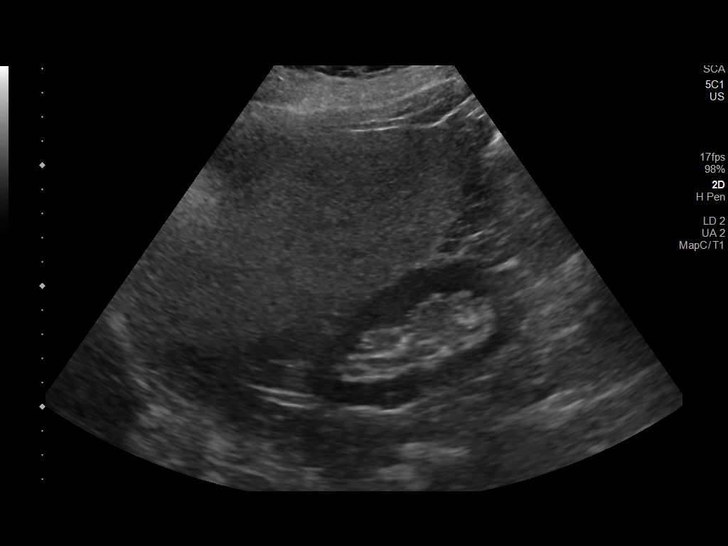
[im 10/28]
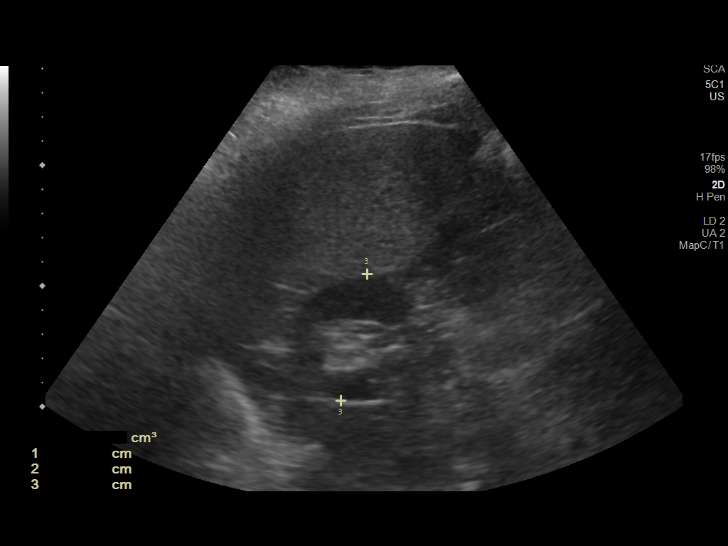
[im 11/28]
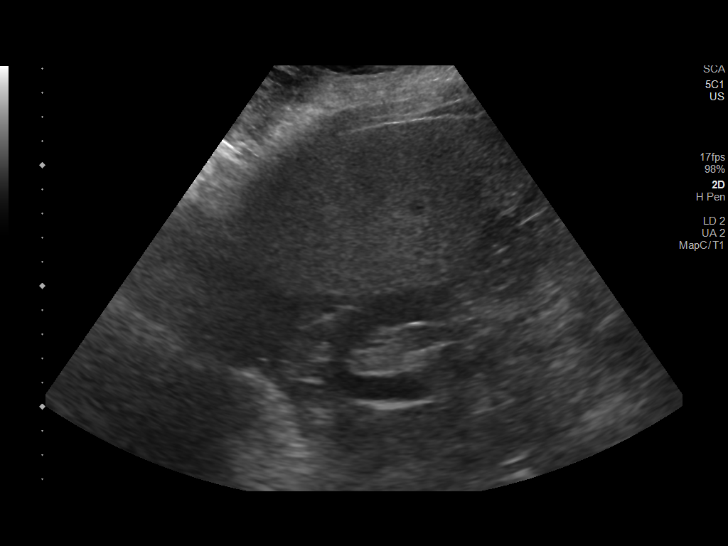
[im 13/28]
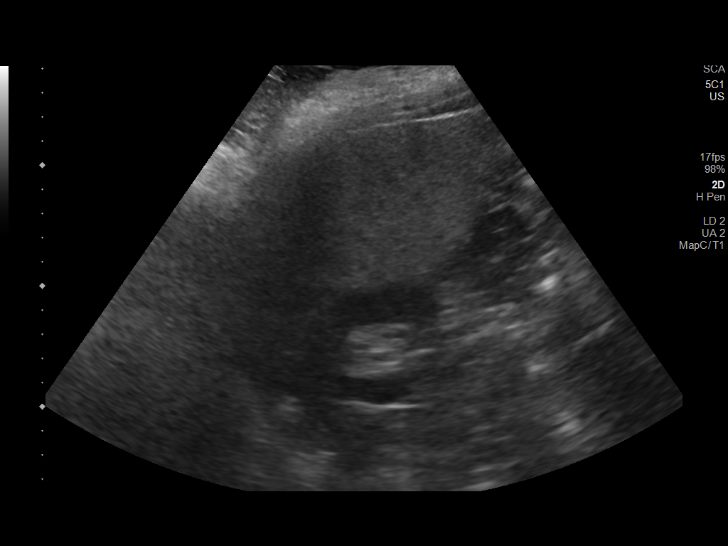
[im 15/28]
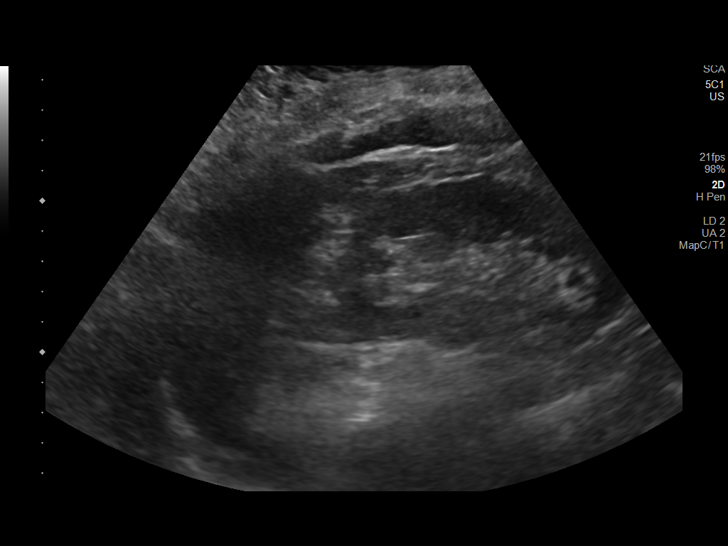
[im 17/28]
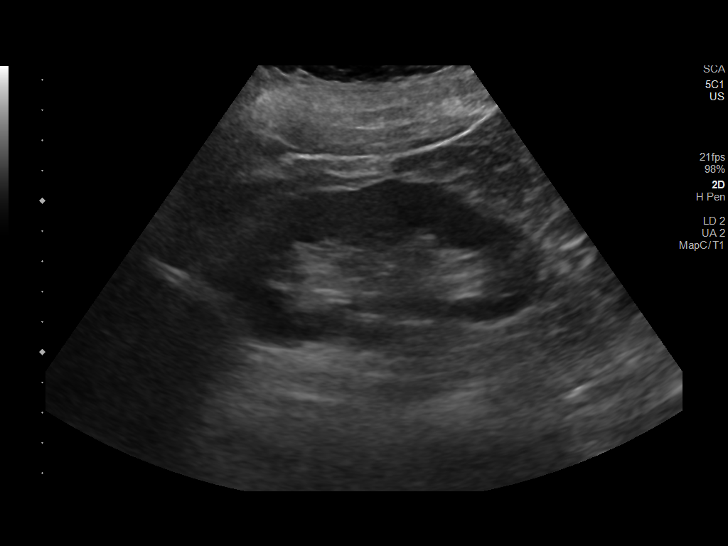
[im 19/28]
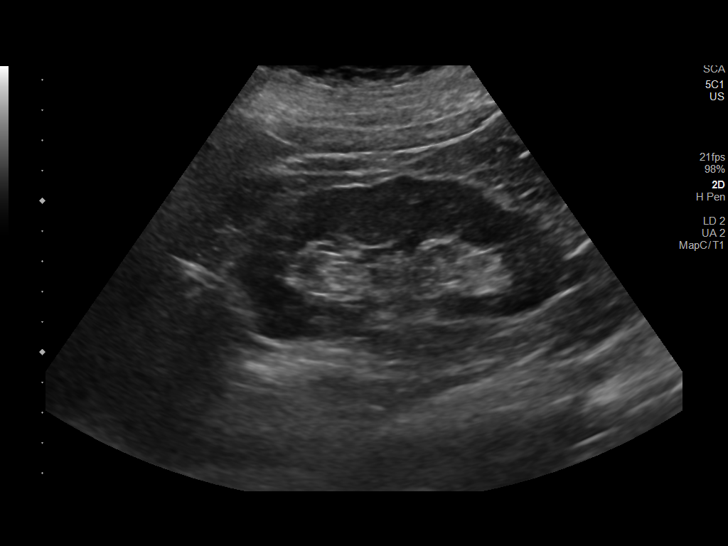
[im 21/28]
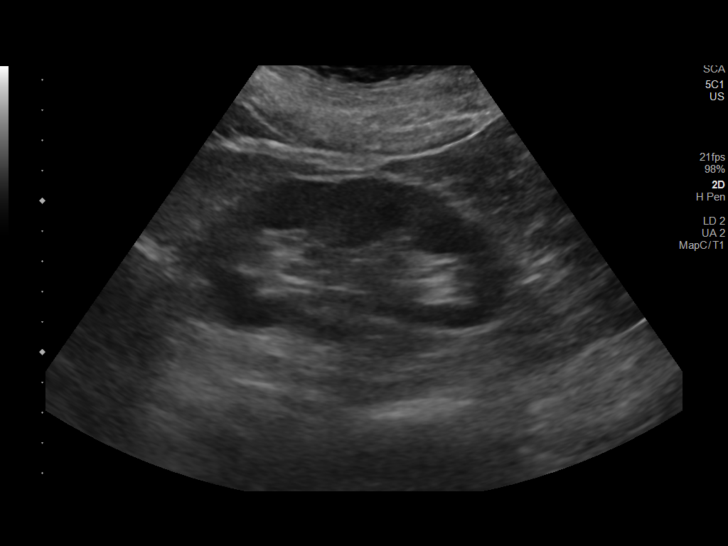
[im 23/28]
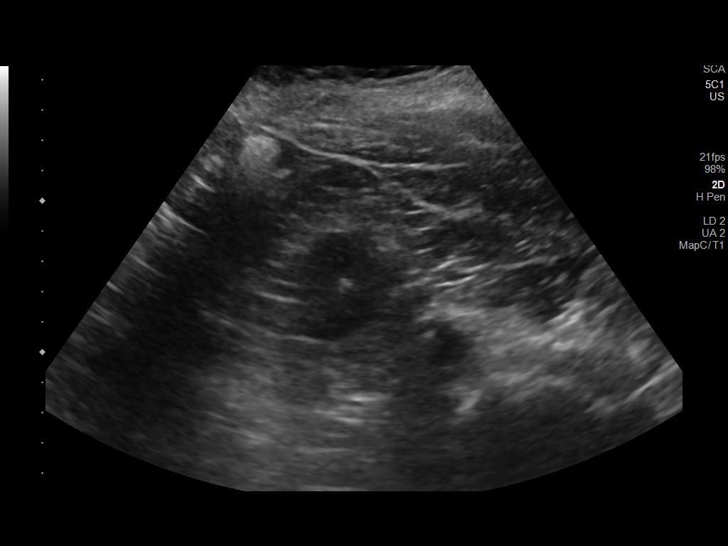
[im 25/28]
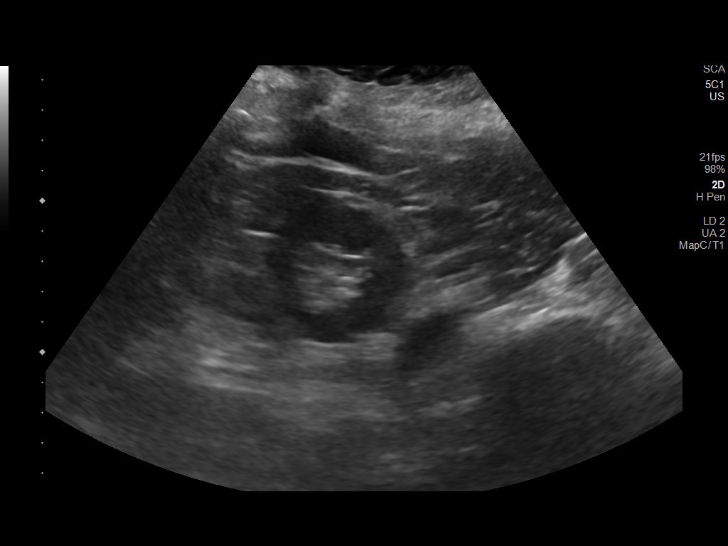
[im 28/28]
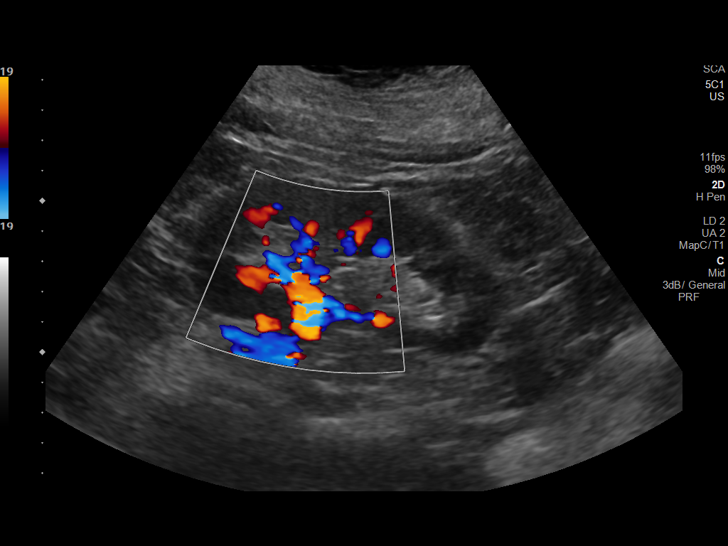

[14 of 25 positions shown; findings below may reference images not displayed]

FINDINGS: Right Kidney:

Renal measurements: 9.9 cm x 4.9 cm x 5.4 cm = volume: 134.8 mL.
Echogenicity within normal limits. No mass or hydronephrosis
visualized.

Left Kidney:

Renal measurements: 11.0 cm x 4.5 cm x 5.1 cm = volume: 131.3 mL.
Echogenicity within normal limits. No mass or hydronephrosis
visualized.

Bladder:

Appears normal for degree of bladder distention.

Other:

None.
IMPRESSION: Normal renal ultrasound.

## 2022-06-05 ENCOUNTER — Other Ambulatory Visit: Payer: Medicare Other

## 2022-06-06 ENCOUNTER — Ambulatory Visit: Payer: Medicare Other | Admitting: Psychiatry

## 2022-06-06 ENCOUNTER — Telehealth: Payer: Self-pay | Admitting: Internal Medicine

## 2022-06-06 NOTE — Telephone Encounter (Signed)
Patient called to talk directly to [facility] about her medication Dr. Gasper Sells prescribed her. Please have only Shamea talk with patient

## 2022-06-07 ENCOUNTER — Ambulatory Visit: Payer: Medicare Other | Admitting: Psychiatry

## 2022-06-07 NOTE — Telephone Encounter (Signed)
Called pt back in regards to possible medication (spironolactone) side effects.  Reports increased fatigue, HA, lightheaded and ringing in ears. Expresses swelling improved but pt stopped taking med over the weekend d/t busy schedule.  Wants to know if should continue medication. Advised pt to stop medication to see if symptoms improve.   Will route to MD to see if has any further suggestions at this time.

## 2022-06-07 NOTE — Telephone Encounter (Signed)
Left a message to call back

## 2022-06-08 ENCOUNTER — Ambulatory Visit: Payer: Medicare Other | Attending: Internal Medicine

## 2022-06-08 DIAGNOSIS — E854 Organ-limited amyloidosis: Secondary | ICD-10-CM

## 2022-06-08 DIAGNOSIS — I4892 Unspecified atrial flutter: Secondary | ICD-10-CM | POA: Diagnosis not present

## 2022-06-08 DIAGNOSIS — I43 Cardiomyopathy in diseases classified elsewhere: Secondary | ICD-10-CM | POA: Diagnosis not present

## 2022-06-08 DIAGNOSIS — R0609 Other forms of dyspnea: Secondary | ICD-10-CM | POA: Diagnosis not present

## 2022-06-09 ENCOUNTER — Other Ambulatory Visit: Payer: Self-pay | Admitting: Internal Medicine

## 2022-06-09 LAB — BASIC METABOLIC PANEL
BUN/Creatinine Ratio: 16 (ref 12–28)
BUN: 17 mg/dL (ref 8–27)
CO2: 23 mmol/L (ref 20–29)
Calcium: 9.2 mg/dL (ref 8.7–10.3)
Chloride: 103 mmol/L (ref 96–106)
Creatinine, Ser: 1.05 mg/dL — ABNORMAL HIGH (ref 0.57–1.00)
Glucose: 100 mg/dL — ABNORMAL HIGH (ref 70–99)
Potassium: 4.6 mmol/L (ref 3.5–5.2)
Sodium: 142 mmol/L (ref 134–144)
eGFR: 56 mL/min/{1.73_m2} — ABNORMAL LOW (ref >59)

## 2022-06-09 LAB — CBC
Hematocrit: 28 % — ABNORMAL LOW (ref 34.0–46.6)
Hemoglobin: 8.7 g/dL — ABNORMAL LOW (ref 11.1–15.9)
MCH: 24 pg — ABNORMAL LOW (ref 26.6–33.0)
MCHC: 31.1 g/dL — ABNORMAL LOW (ref 31.5–35.7)
MCV: 77 fL — ABNORMAL LOW (ref 79–97)
Platelets: 330 10*3/uL (ref 150–450)
RBC: 3.62 x10E6/uL — ABNORMAL LOW (ref 3.77–5.28)
RDW: 17.1 % — ABNORMAL HIGH (ref 11.7–15.4)
WBC: 8.4 10*3/uL (ref 3.4–10.8)

## 2022-06-12 ENCOUNTER — Telehealth: Payer: Self-pay | Admitting: Internal Medicine

## 2022-06-12 NOTE — Telephone Encounter (Signed)
Patient wants a call back directly from [facility].

## 2022-06-12 NOTE — Telephone Encounter (Signed)
Left message to call the clinic. 

## 2022-06-12 NOTE — Telephone Encounter (Signed)
Left voicemail for patient to return call to office. 

## 2022-06-12 NOTE — Telephone Encounter (Signed)
Pt is returning call, but states the she only wants to speak directly to RN Shamea.

## 2022-06-13 ENCOUNTER — Ambulatory Visit: Payer: Medicare Other | Admitting: Cardiology

## 2022-06-14 ENCOUNTER — Ambulatory Visit: Payer: Medicare Other | Admitting: Psychiatry

## 2022-06-15 NOTE — Telephone Encounter (Signed)
2 open encounters for pt. Will close this encounter please see previous telephone encounter.

## 2022-06-15 NOTE — Telephone Encounter (Signed)
Patient returned call, she states she only wants to speak with Shamea.

## 2022-06-15 NOTE — Telephone Encounter (Signed)
Spoke with pt reports previously stopped torsemide d/t ringing in the ears.  Restarted yesterday d/t increased ankle swelling.  Also ringing in ear did not stop when torsemide was stopped.   Reviewed with pt what MD means by genetic testing.  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  All questions answered.

## 2022-06-15 NOTE — Telephone Encounter (Signed)
Left a message to call back.

## 2022-06-19 ENCOUNTER — Telehealth (HOSPITAL_COMMUNITY): Payer: Self-pay | Admitting: *Deleted

## 2022-06-19 NOTE — Telephone Encounter (Signed)
Reaching out to patient to offer assistance regarding upcoming cardiac imaging study; pt verbalizes understanding of appt date/time, parking situation and where to check in, and verified current allergies; name and call back number provided for further questions should they arise  Gordy Clement RN Navigator Cardiac Imaging [facility] [phone] office [phone] cell   Patient denies claustrophobia but reports partial dental implant.

## 2022-06-19 NOTE — Telephone Encounter (Signed)
Attempted to call patient regarding upcoming cardiac MRI appointment. Left message on voicemail with name and callback number  Terril Amaro RN Navigator Cardiac Imaging [facility] [phone] Office [phone] Cell  

## 2022-06-20 ENCOUNTER — Ambulatory Visit: Admission: RE | Admit: 2022-06-20 | Payer: Medicare Other | Source: Ambulatory Visit

## 2022-06-21 ENCOUNTER — Telehealth: Payer: Self-pay

## 2022-06-21 NOTE — Patient Outreach (Signed)
  Care Coordination   06/21/2022 Name: MOANI RAEL MRN: [id number] DOB: 07-Sep-1945   Care Coordination Outreach Attempts:  An unsuccessful telephone outreach was attempted today to offer the patient information about available care coordination services as a benefit of their health plan.   Follow Up Plan:  Additional outreach attempts will be made to offer the patient care coordination information and services.   Encounter Outcome:  No Answer   Care Coordination Interventions:  No, not indicated    SIG  Peter Garter RN, BSN,CCM, CDE Care Management Coordinator [facility] [phone]

## 2022-06-22 ENCOUNTER — Ambulatory Visit: Payer: Medicare Other | Admitting: Psychiatry

## 2022-06-22 NOTE — Telephone Encounter (Signed)
Pt concern addressed on another telephone encounter.

## 2022-06-25 ENCOUNTER — Telehealth: Payer: Self-pay | Admitting: Internal Medicine

## 2022-06-25 NOTE — Telephone Encounter (Signed)
Requested labs routed to PCP Marda Stalker, PA office via [facility] as requested.

## 2022-06-25 NOTE — Telephone Encounter (Signed)
Pt is requesting that we fax lab results to her PCP, [facility] at [place]. Fax #: [phone]. Pt would like the blood test results on 3/8, kidney function on 12/27, and the hemoglobin 2/7 results faxed over. She states she doesn't need a call back, only if there are questions.

## 2022-06-26 DIAGNOSIS — Z23 Encounter for immunization: Secondary | ICD-10-CM | POA: Diagnosis not present

## 2022-06-26 DIAGNOSIS — I10 Essential (primary) hypertension: Secondary | ICD-10-CM | POA: Diagnosis not present

## 2022-06-26 DIAGNOSIS — D649 Anemia, unspecified: Secondary | ICD-10-CM | POA: Diagnosis not present

## 2022-06-26 DIAGNOSIS — D508 Other iron deficiency anemias: Secondary | ICD-10-CM | POA: Diagnosis not present

## 2022-06-26 DIAGNOSIS — E039 Hypothyroidism, unspecified: Secondary | ICD-10-CM | POA: Diagnosis not present

## 2022-06-26 DIAGNOSIS — R202 Paresthesia of skin: Secondary | ICD-10-CM | POA: Diagnosis not present

## 2022-06-26 DIAGNOSIS — Z6836 Body mass index (BMI) 36.0-36.9, adult: Secondary | ICD-10-CM | POA: Diagnosis not present

## 2022-06-26 DIAGNOSIS — N3011 Interstitial cystitis (chronic) with hematuria: Secondary | ICD-10-CM | POA: Diagnosis not present

## 2022-06-26 DIAGNOSIS — I700 Atherosclerosis of aorta: Secondary | ICD-10-CM | POA: Diagnosis not present

## 2022-06-26 DIAGNOSIS — I350 Nonrheumatic aortic (valve) stenosis: Secondary | ICD-10-CM | POA: Diagnosis not present

## 2022-06-26 DIAGNOSIS — I2510 Atherosclerotic heart disease of native coronary artery without angina pectoris: Secondary | ICD-10-CM | POA: Diagnosis not present

## 2022-06-26 DIAGNOSIS — E669 Obesity, unspecified: Secondary | ICD-10-CM | POA: Diagnosis not present

## 2022-06-26 DIAGNOSIS — F419 Anxiety disorder, unspecified: Secondary | ICD-10-CM | POA: Diagnosis not present

## 2022-06-26 DIAGNOSIS — E7800 Pure hypercholesterolemia, unspecified: Secondary | ICD-10-CM | POA: Diagnosis not present

## 2022-06-27 ENCOUNTER — Ambulatory Visit: Payer: Medicare Other | Admitting: Cardiology

## 2022-06-27 ENCOUNTER — Telehealth: Payer: Self-pay

## 2022-06-27 NOTE — Telephone Encounter (Signed)
Pt called in to report PCP reviewed labs sent over and feels pt may need a blood transfusion.  Hemoccult + at PCP office.  Pt is waiting on f/u labs to result from PCP for a definite plan.   Pt rescheduled Cmri to 07/25/22.  Would like to reschedule OV with Dr. Gasper Sells.  Rescheduled to 08/02/22 at 1:30 pm.

## 2022-06-28 ENCOUNTER — Ambulatory Visit: Payer: Medicare Other | Admitting: Psychiatry

## 2022-07-04 ENCOUNTER — Telehealth: Payer: Self-pay | Admitting: Hematology and Oncology

## 2022-07-04 NOTE — Telephone Encounter (Signed)
scheduled per 4/3 referral , pt has been called and confirmed date and time. Pt is aware of location and to arrive early for check in

## 2022-07-05 ENCOUNTER — Ambulatory Visit: Payer: Medicare Other | Admitting: Psychiatry

## 2022-07-05 ENCOUNTER — Telehealth: Payer: Self-pay

## 2022-07-05 NOTE — Patient Outreach (Signed)
  Care Coordination   07/05/2022 Name: Jennifer [name] MRN: [id number] DOB: 1945-06-02   Care Coordination Outreach Attempts:  A second unsuccessful outreach was attempted today to offer the patient with information about available care coordination services as a benefit of their health plan.     Follow Up Plan:  Additional outreach attempts will be made to offer the patient care coordination information and services.   Encounter Outcome:  No Answer   Care Coordination Interventions:  No, not indicated    SIG Peter Garter RN, BSN,CCM, CDE Care Management Coordinator [facility] [phone]

## 2022-07-06 ENCOUNTER — Ambulatory Visit: Payer: Medicare Other | Admitting: Internal Medicine

## 2022-07-06 ENCOUNTER — Ambulatory Visit: Payer: Medicare Other

## 2022-07-09 ENCOUNTER — Other Ambulatory Visit (HOSPITAL_COMMUNITY): Payer: Medicare Other

## 2022-07-11 DIAGNOSIS — D509 Iron deficiency anemia, unspecified: Secondary | ICD-10-CM | POA: Diagnosis not present

## 2022-07-11 DIAGNOSIS — Z7901 Long term (current) use of anticoagulants: Secondary | ICD-10-CM | POA: Diagnosis not present

## 2022-07-12 ENCOUNTER — Ambulatory Visit (INDEPENDENT_AMBULATORY_CARE_PROVIDER_SITE_OTHER): Payer: Medicare Other | Admitting: Psychiatry

## 2022-07-12 DIAGNOSIS — F411 Generalized anxiety disorder: Secondary | ICD-10-CM | POA: Diagnosis not present

## 2022-07-12 DIAGNOSIS — N3090 Cystitis, unspecified without hematuria: Secondary | ICD-10-CM

## 2022-07-12 DIAGNOSIS — Z638 Other specified problems related to primary support group: Secondary | ICD-10-CM

## 2022-07-12 DIAGNOSIS — D500 Iron deficiency anemia secondary to blood loss (chronic): Secondary | ICD-10-CM

## 2022-07-12 DIAGNOSIS — M797 Fibromyalgia: Secondary | ICD-10-CM

## 2022-07-12 DIAGNOSIS — M48061 Spinal stenosis, lumbar region without neurogenic claudication: Secondary | ICD-10-CM | POA: Diagnosis not present

## 2022-07-12 DIAGNOSIS — Z9189 Other specified personal risk factors, not elsewhere classified: Secondary | ICD-10-CM | POA: Diagnosis not present

## 2022-07-12 DIAGNOSIS — G9332 Myalgic encephalomyelitis/chronic fatigue syndrome: Secondary | ICD-10-CM

## 2022-07-12 NOTE — Progress Notes (Signed)
Psychotherapy Progress Note [facility] Marliss Czar, PhD LP  Patient ID: Jennifer [name] [facility])    MRN: [id number] Therapy format: Individual psychotherapy Date: 07/12/2022      Start: 2:13p     Stop: 3:03p     Time Spent: 50 min Location: Telehealth visit -- I connected with this patient by an approved telecommunication method (audio only), with her informed consent, and verifying identity and patient privacy.  I was located at my office and patient at her home.  As needed, we discussed the limitations, risks, and security and privacy concerns associated with telehealth service, including the availability and conditions which currently govern in-person appointments and the possibility that 3rd-party payment may not be fully guaranteed and she may be responsible for charges.  After she indicated understanding, we proceeded with the session.  Also discussed treatment planning, as needed, including ongoing verbal agreement with the plan, the opportunity to ask and answer all questions, her demonstrated understanding of instructions, and her readiness to call the office should symptoms worsen or she feels she is in a crisis state and needs more immediate and tangible assistance.   Session narrative (presenting needs, interim history, self-report of stressors and symptoms, applications of prior therapy, status changes, and interventions made in session) Lots of anxiety going, cites lower extremity edema, going on extra diuretic.  Found to be mildly of concern for kidneys but severely anemic.  Recent Hgb 8.7, 8.6.  Iron count 18 (ref 50-112).  Partial lab results available on [facility] show low Hgb back 2 years, but clearly worse off now, and reportedly [facility] results confirm.  Suspect a GI bleeder, has been recommended for catch-up endoscopy/colonoscopy and transfusion.  Continues with [facility] gastroenterology, just seen yesterday, with a June 14 date set.  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  Is starting on B12 and iron supplement now.  Blessing to find that nutritionist is now with [facility] and better integrated, and she will be seeing.  Medicare+Medicaid coverage is handling new pill-based colonoscopy prep.  And hemoccult at [facility] was positive, with possibility of blood thinner contributing (Xarelto, changed to Eliquis).   Has continued on two meds for GERD, agitated by use of mustard for leg cramps.    Support/empathy provided.  Affirmed and encouraged in measures already taken.  Offered recently learned natural options for GERD -- turmeric capsules QPM and melatonin.  Despite normal magnesium test, recommend magnesium supplementation anyway d/t notoriously unreliable blood level test, cramps associated with Mg deficiency, and side benefits for anxiety and constipation.  Therapeutic modalities: Cognitive Behavioral Therapy, Solution-Oriented/Positive Psychology, and Ego-Supportive  Mental Status/Observations:  Appearance:   Casual     Behavior:  monopolizing  Motor:  Painful gait  Speech/Language:   Clear and Coherent  Affect:  Appropriate  Mood:  Tense, dysthymic  Thought process:  normal  Thought content:    Rumination  Sensory/Perceptual disturbances:    WNL  Orientation:  Fully oriented  Attention:  Good    Concentration:  Good  Memory:  WNL  Insight:    Fair  Judgment:   Good  Impulse Control:  Good   Risk Assessment: Danger to Self: No Self-injurious Behavior: No Danger to Others: No Physical Aggression / Violence: No Duty to Warn: No Access to Firearms a concern: No  Assessment of progress:  stabilized  Diagnosis:   ICD-10-CM   1. Generalized anxiety disorder  F41.1     2. Relationship problem with family  members  Z63.8     3. At increased risk for social isolation  Z91.89     4. Spinal stenosis, lumbar region, without neurogenic claudication  M48.061     5. Chronic fatigue syndrome with fibromyalgia   G93.32    M79.7     6. Recurrent bacterial cystitis  N30.90     7. Iron deficiency anemia due to chronic blood loss  D50.0      Plan:  Chronic relationship issues with [name] -- OK to proceed with arranging partially remote family session if welcome and feasible.  Emphasize watching tone and implications, stay ready to be declined, and stay ready to ask for listening before reciting grievances Anxiety -- Get adequate sleep.  Work through Film/video editor for relocating, if feasible, and make sue of Child psychotherapist for any questions related to benefits and rights.  Try not to agonize about brother relationship but direct toward accepting what can, changing what must.  Still recommend magnesium supplementation, despite test. Self-care for pain and other conditions -- Maintain recommended self-care from medical specialties, especially for anemia and back pain, including recommended supplements (B12, iron) and tests (GI scopes, for bleeder) Other recommendations/advice as may be noted above Continue to utilize previously learned skills ad lib Maintain medication as prescribed and work faithfully with relevant prescriber(s) if any changes are desired or seem indicated Call the clinic on-call service, 988/hotline, 911, or present to [facility] or ER if any life-threatening psychiatric crisis Return for time as already scheduled. Already scheduled visit in this office 07/19/2022.  Robley Fries, PhD Marliss Czar, PhD LP Clinical Psychologist, [facility] [address] [place], [place] [address] [phone]

## 2022-07-17 ENCOUNTER — Inpatient Hospital Stay: Payer: Medicare Other | Admitting: Hematology and Oncology

## 2022-07-17 ENCOUNTER — Inpatient Hospital Stay: Payer: Medicare Other

## 2022-07-18 ENCOUNTER — Ambulatory Visit: Payer: Medicare Other | Admitting: Cardiology

## 2022-07-19 ENCOUNTER — Telehealth: Payer: Self-pay

## 2022-07-19 ENCOUNTER — Ambulatory Visit (INDEPENDENT_AMBULATORY_CARE_PROVIDER_SITE_OTHER): Payer: Medicare Other | Admitting: Psychiatry

## 2022-07-19 DIAGNOSIS — Z5986 Financial insecurity: Secondary | ICD-10-CM

## 2022-07-19 DIAGNOSIS — N3090 Cystitis, unspecified without hematuria: Secondary | ICD-10-CM

## 2022-07-19 DIAGNOSIS — M48061 Spinal stenosis, lumbar region without neurogenic claudication: Secondary | ICD-10-CM

## 2022-07-19 DIAGNOSIS — Z638 Other specified problems related to primary support group: Secondary | ICD-10-CM

## 2022-07-19 DIAGNOSIS — Z9189 Other specified personal risk factors, not elsewhere classified: Secondary | ICD-10-CM

## 2022-07-19 DIAGNOSIS — D500 Iron deficiency anemia secondary to blood loss (chronic): Secondary | ICD-10-CM | POA: Diagnosis not present

## 2022-07-19 DIAGNOSIS — G9332 Myalgic encephalomyelitis/chronic fatigue syndrome: Secondary | ICD-10-CM

## 2022-07-19 DIAGNOSIS — Z5919 Other inadequate housing: Secondary | ICD-10-CM | POA: Diagnosis not present

## 2022-07-19 DIAGNOSIS — M797 Fibromyalgia: Secondary | ICD-10-CM

## 2022-07-19 DIAGNOSIS — F411 Generalized anxiety disorder: Secondary | ICD-10-CM

## 2022-07-19 NOTE — Progress Notes (Signed)
Psychotherapy Progress Note [facility] Marliss Czar, PhD LP  Patient ID: Jennifer [name] [facility])    MRN: [id number] Therapy format: Individual psychotherapy Date: 07/19/2022      Start: 2:11p     Stop: 3:01p     Time Spent: 50 min Location: In-person   Session narrative (presenting needs, interim history, self-report of stressors and symptoms, applications of prior therapy, status changes, and interventions made in session) Battling anemia still, working with the vagaries of getting DSS rides, pharmacy appt, this appt, etc.  Chronic, multiple back problems continue (lumbar stenosis, "peripheral" neuropathy, scoliosis, radiculopathy, at least).  Last week's plan to go to hematology delayed by pain flareup.  PCP supposedly wants her in quickly, with fear of need to go to ER for infusion.  Colonoscopy now set for 6/14 after, but uncertain if she can arrange aftercare with friend [name], with whom she is recently miffed for balking at providing help getting to pharmacy.  Encouraged to lay to rest grievances, work out Social research officer, government.  Meanwhile, at the apartment, has had a situation over 6 years of still-packed boxes, that buckled the carpet, and apartment inspection (which she complains was never specified) that necessitated seeking urgent help moving the boxes.  C/o poor communication in general from Investment banker, corporate -- the 7th in 6.5 yrs.  Encouraged to continue working amicably with the staff she has.  Discussed [name] again, and mother's ashes still in holding (?) at funeral home 8 or so years on.  Therapeutic modalities: Cognitive Behavioral Therapy, Solution-Oriented/Positive Psychology, and Ego-Supportive  Mental Status/Observations:  Appearance:   Casual     Behavior:  Monopolizing  Motor:  Affected by pain  Speech/Language:   Clear and Coherent  Affect:  Appropriate  Mood:  dysthymic  Thought process:  normal  Thought content:    WNL  Sensory/Perceptual  disturbances:    WNL  Orientation:  Fully oriented  Attention:  Good    Concentration:  Fair  Memory:  WNL  Insight:    Fair  Judgment:   Good  Impulse Control:  Good   Risk Assessment: Danger to Self: No Self-injurious Behavior: No Danger to Others: No Physical Aggression / Violence: No Duty to Warn: No Access to Firearms a concern: No  Assessment of progress:  stabilized  Diagnosis:   ICD-10-CM   1. Generalized anxiety disorder  F41.1     2. Relationship problem with family members  Z63.8     3. At increased risk for social isolation  Z91.89     4. Spinal stenosis, lumbar region, without neurogenic claudication  M48.061     5. Chronic fatigue syndrome with fibromyalgia  G93.32    M79.7     6. Iron deficiency anemia due to chronic blood loss  D50.0     7. Recurrent bacterial cystitis  N30.90     8. Financial insecurity  Z59.86     9. Cluttered home environment  Z59.19      Plan:  Chronic relationship issues with [name] -- OK to proceed with arranging partially remote family session if welcome and feasible.  Emphasize watching tone and implications, stay ready to be declined, and stay ready to ask for listening before reciting grievances Anxiety -- Get adequate sleep.  Work through Film/video editor for relocating, if feasible, and make sue of Child psychotherapist for any questions related to benefits and rights.  Try not to agonize about brother relationship but direct toward accepting what can, changing what must.  Still  recommend magnesium supplementation, despite test. Self-care for pain and other conditions -- Maintain recommended self-care from medical specialties, especially for anemia and back pain, including recommended supplements (B12, iron) and tests (GI scopes, for bleeder) Other recommendations/advice as may be noted above Continue to utilize previously learned skills ad lib Maintain medication as prescribed and work faithfully with relevant prescriber(s) if any changes  are desired or seem indicated Call the clinic on-call service, 988/hotline, 911, or present to [facility] or ER if any life-threatening psychiatric crisis Return for time as already scheduled. Already scheduled visit in this office 07/26/2022.  Robley Fries, PhD Marliss Czar, PhD LP Clinical Psychologist, [facility] [address], [address] [place], [place] [address] [phone]

## 2022-07-19 NOTE — Patient Instructions (Signed)
Visit Information  Thank you for taking time to visit with me today. Please don't hesitate to contact me if I can be of assistance to you.   Following are the goals we discussed today:   Goals Addressed             This Visit's Progress    COMPLETED: Care Coordination Activities- No Follow Up Required       Interventions Today    Flowsheet Row Most Recent Value  Chronic Disease   Chronic disease during today's visit Hypertension (HTN), Other, Atrial Fibrillation (AFib)  [anemia]  General Interventions   General Interventions Discussed/Reviewed General Interventions Discussed, Doctor Visits  [Declines needing any further RNCM interventions. Given number to contact RNCM if needs change]  Doctor Visits Discussed/Reviewed Doctor Visits Discussed, Annual Wellness Visits, PCP, Specialist  PCP/Specialist Visits Compliance with follow-up visit  Education Interventions   Education Provided Provided Education  Provided Verbal Education On When to see the doctor, Other  [care coordination services]  Pharmacy Interventions   Pharmacy Dicussed/Reviewed Pharmacy Topics Discussed               If you are experiencing a Mental Health or Behavioral Health Crisis or need someone to talk to, please call the Suicide and Crisis Lifeline: 988 call the [place] National Suicide Prevention Lifeline: 313-812-0098 or TTY: 435-363-2181 TTY (937) 210-6396) to talk to a trained counselor call 1-800-273-TALK (toll free, 24 hour hotline) go to [facility] [address] [place] [phone]) call 911   Patient verbalizes understanding of instructions and care plan provided today and agrees to view in MyChart. Active MyChart status and patient understanding of how to access instructions and care plan via MyChart confirmed with patient.     No further follow up required:    SIGNATURE Dudley Major RN, Maximiano Coss, CDE Care Management Coordinator [facility] [phone]

## 2022-07-19 NOTE — Patient Outreach (Signed)
  Care Coordination   Initial Visit Note   07/19/2022 Name: Jennifer [name] MRN: [id number] DOB: 1945-05-27  Jennifer [name] is a 77 y.o. year old female who sees Jennifer [name], [place] for primary care. I spoke with  Jennifer [name] by phone today.  What matters to the patients health and wellness today?  No concerns for RNCM today. States she feels she does not need care coordination services at this time as she is a strong advocate for herself.  States she will call [facility]  in the future if needed.    Goals Addressed             This Visit's Progress    COMPLETED: Care Coordination Activities- No Follow Up Required       Interventions Today    Flowsheet Row Most Recent Value  Chronic Disease   Chronic disease during today's visit Hypertension (HTN), Other, Atrial Fibrillation (AFib)  [anemia]  General Interventions   General Interventions Discussed/Reviewed General Interventions Discussed, Doctor Visits  [Declines needing any further RNCM interventions. Given number to contact RNCM if needs change]  Doctor Visits Discussed/Reviewed Doctor Visits Discussed, Annual Wellness Visits, PCP, Specialist  PCP/Specialist Visits Compliance with follow-up visit  Education Interventions   Education Provided Provided Education  Provided Verbal Education On When to see the doctor, Other  [care coordination services]  Pharmacy Interventions   Pharmacy Dicussed/Reviewed Pharmacy Topics Discussed              SDOH assessments and interventions completed:  Yes  SDOH Interventions Today    Flowsheet Row Most Recent Value  SDOH Interventions   Food Insecurity Interventions Intervention Not Indicated  Housing Interventions Intervention Not Indicated  Transportation Interventions Intervention Not Indicated  [uses DSS]  Utilities Interventions Intervention Not Indicated        Care Coordination Interventions:  Yes, provided   Follow up plan: No further  intervention required.   Encounter Outcome:  Pt. Visit Completed  Dudley Major RN, BSN,CCM, CDE Care Management Coordinator [facility] Care Management [phone]

## 2022-07-24 ENCOUNTER — Telehealth (HOSPITAL_COMMUNITY): Payer: Self-pay | Admitting: Emergency Medicine

## 2022-07-24 NOTE — Telephone Encounter (Signed)
Attempted to call patient regarding upcoming cardiac CT appointment. °Left message on voicemail with name and callback number °Lakea Mittelman RN Navigator Cardiac Imaging °[facility] °[phone] Office °[phone] Cell ° °

## 2022-07-25 ENCOUNTER — Other Ambulatory Visit: Payer: Self-pay | Admitting: Internal Medicine

## 2022-07-25 ENCOUNTER — Encounter (HOSPITAL_COMMUNITY): Payer: Self-pay | Admitting: Internal Medicine

## 2022-07-25 ENCOUNTER — Ambulatory Visit (HOSPITAL_COMMUNITY)
Admission: RE | Admit: 2022-07-25 | Discharge: 2022-07-25 | Disposition: A | Discharge status: Home / Self Care | Payer: Medicare Other | Source: Ambulatory Visit | Attending: Internal Medicine | Admitting: Internal Medicine

## 2022-07-25 DIAGNOSIS — E854 Organ-limited amyloidosis: Secondary | ICD-10-CM

## 2022-07-25 DIAGNOSIS — I43 Cardiomyopathy in diseases classified elsewhere: Secondary | ICD-10-CM | POA: Diagnosis not present

## 2022-07-25 MED ORDER — GADOBUTROL 1 MMOL/ML IV SOLN
12.0000 mL | Freq: Once | INTRAVENOUS | Status: AC | PRN
  Administered 2022-07-25: 12 mL via INTRAVENOUS

## 2022-07-26 ENCOUNTER — Ambulatory Visit: Payer: Medicare Other | Admitting: Psychiatry

## 2022-07-26 DIAGNOSIS — Z7901 Long term (current) use of anticoagulants: Secondary | ICD-10-CM | POA: Diagnosis not present

## 2022-07-26 DIAGNOSIS — B379 Candidiasis, unspecified: Secondary | ICD-10-CM | POA: Diagnosis not present

## 2022-07-26 DIAGNOSIS — Z6836 Body mass index (BMI) 36.0-36.9, adult: Secondary | ICD-10-CM | POA: Diagnosis not present

## 2022-07-26 DIAGNOSIS — D509 Iron deficiency anemia, unspecified: Secondary | ICD-10-CM | POA: Diagnosis not present

## 2022-07-26 DIAGNOSIS — D649 Anemia, unspecified: Secondary | ICD-10-CM | POA: Diagnosis not present

## 2022-07-26 DIAGNOSIS — M7989 Other specified soft tissue disorders: Secondary | ICD-10-CM | POA: Diagnosis not present

## 2022-08-01 ENCOUNTER — Telehealth: Payer: Self-pay | Admitting: *Deleted

## 2022-08-01 NOTE — Telephone Encounter (Signed)
   Pre-operative Risk Assessment    Patient Name: Jennifer [name]  DOB: February 17, 1946 MRN: [id number]      Request for Surgical Clearance    Procedure:   COLONOSCOPY/ENDOSCOPY  Date of Surgery:  Clearance 08/15/22                                 Surgeon:  [facility]. [facility] Surgeon's Group or Practice Name:  [facility] Phone number:  [phone] Fax number:  [phone]   Type of Clearance Requested:   - Medical  - Pharmacy:  Hold Apixaban (Eliquis)     Type of Anesthesia:  Not Indicated (PROPOFOL?)   Additional requests/questions:    Elpidio Anis   08/01/2022, 5:59 PM

## 2022-08-01 NOTE — Telephone Encounter (Signed)
   Name: Jennifer [name]  DOB: [birth year]  MRN: [id number]  Primary Cardiologist: Christell Constant, MD  Chart reviewed as part of pre-operative protocol coverage. The patient has an upcoming visit scheduled with Dr. Raynelle Jan on 08/02/2022 at which time clearance can be addressed in case there are any issues that would impact surgical recommendations.   This message will also be routed to pharmacy pool  for input on holding Eliquis as requested below so that this information is available to the clearing provider at time of patient's appointment.   I will route this message as FYI to requesting party and remove this message from the preop box as separate preop APP input not needed at this time.   Please call with any questions.  Napoleon Form, Leodis Rains, NP  08/01/2022, 9:24 PM

## 2022-08-01 NOTE — Progress Notes (Unsigned)
Cardiology Office Note:    Date:  08/02/2022   ID:  ELIETTE DRUMWRIGHT, DOB [birth year], MRN [id number]  PCP:  Jarrett Soho, PA-C   [facility] Providers Cardiologist:  Christell Constant, MD Electrophysiologist:  Lanier Prude, MD     Referring MD: Jarrett Soho, PA-C   CC: Follow up SOB  History of Present Illness:    Jennifer [name] is a 77 y.o. female with a hx of of diffuse CAD not amenable to intervention, AFL, Mild AA, Moderate AS.  Chronic pain syndrome.   Known LBBB.   Found to have CAD in 2022.  Unamenable to intervention.  AS had issues with a variety of medications and advised to see allergy. 2023: Moderate AS.  Had re-assuring stress test. 2024: Positive PYP but with normal CMR/ECV.  Patient notes that she is doing poorly. She is having issues related to her anemia: no improvement despite PO iron supplementation. No bleeding issues.  She has hematology follow up soon.  She notes feels no chest pain.  She has statin issues and was hesitant to try pravastatin or zetia; she is seeing Pharm D clinic after this visit.  She is amenable to trying rosuvastatin intermittently.  She notes that her father had MI at age 29.  She notes worsening leg swelling and fatigue.  She was started on torsemide.  She does not find it affective.  She notes that she has unique issues with medications, and would like to retry lasix again.  She notes rare palpitations.  Has not worn heart monitor for dealing with so much.  She has been holding off EP f/u for this reason.  We reviewed her PYP and CMR at length.  Past Medical History:  Diagnosis Date   Acute meniscal tear of left knee    FOLLOWED BY DR GIAFFREY   Anemia    Aortic stenosis    Ascending aorta dilation (HCC)    Atrial flutter (HCC)    AV block, Mobitz 1    BPPV (benign paroxysmal positional vertigo)    CAD (coronary artery disease)    Chronic bladder pain    Chronic fatigue syndrome    Chronic low  back pain    W/ RIGHT LEG PAIN AND NUMBNESS   Cystitis, chronic    Fibromyalgia    GERD (gastroesophageal reflux disease)    Hiatal hernia    Hypertension    IC (interstitial cystitis)    LBBB (left bundle branch block)    transient hx   OSA (obstructive sleep apnea)    per pt study yrs ago-- moderate osa ,  intolerant cpap   Pinched vertebral nerve    bilateral L2 -- L3 and L3 -- L4-/  epidural injection's treatment , PT and pain clince   PONV (postoperative nausea and vomiting)    severe   PVC's (premature ventricular contractions)    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    Self-catheterizes urinary bladder    Spinal stenosis, lumbar region with neurogenic claudication    Wears glasses    Wears partial dentures    upper and lower    Past Surgical History:  Procedure Laterality Date   ABDOMINAL AORTOGRAM N/A 09/16/2020   Procedure: ABDOMINAL AORTOGRAM;  Surgeon: Iran Ouch, MD;  Location: [facility];  Service: Cardiovascular;  Laterality: N/A;   ABDOMINAL HYSTERECTOMY  1980   w/ Left Salpingoophorectomy   BIOPSY  08/27/2021  Procedure: BIOPSY;  Surgeon: Vida Rigger, MD;  Location: [facility] ENDOSCOPY;  Service: Gastroenterology;;   CARDIAC CATHETERIZATION  08-21-2001  dr Verdis Prime   normal coronary arteries and LVF   CARPAL TUNNEL RELEASE Bilateral 1986   CECALCYSTOPLASTY/  APPENDECTOMY  1984   at [facility]   CHOLECYSTECTOMY  1992   CYSTO WITH HYDRODISTENSION N/A 01/12/2016   Procedure: CYSTOSCOPY/HYDRODISTENSION;  Surgeon: Jethro Bolus, MD;  Location: [facility];  Service: Urology;  Laterality: N/A;   CYSTO WITH HYDRODISTENSION N/A 08/30/2016   Procedure: CYSTOSCOPY/HYDRODISTENSION OF BLADDER INJECTION OF MARCAINE/PYRIDIUM;  Surgeon: Jethro Bolus, MD;  Location: [facility];  Service: Urology;  Laterality: N/A;   ESOPHAGOGASTRODUODENOSCOPY (EGD) WITH PROPOFOL N/A 08/27/2021    Procedure: ESOPHAGOGASTRODUODENOSCOPY (EGD) WITH PROPOFOL;  Surgeon: Vida Rigger, MD;  Location: [facility] ENDOSCOPY;  Service: Gastroenterology;  Laterality: N/A;   LEFT HEART CATH AND CORONARY ANGIOGRAPHY N/A 09/16/2020   Procedure: LEFT HEART CATH AND CORONARY ANGIOGRAPHY;  Surgeon: Iran Ouch, MD;  Location: [facility];  Service: Cardiovascular;  Laterality: N/A;   MULTIPLE CYSTO/  HYDRODISTENTION/  INSTILLATION THERAPY  last one 08-19-2009   NASAL SINUS SURGERY  1987   TONSILLECTOMY  as child   VAGINAL GROWTH REMOVED  as teen    Current Medications: Current Meds  Medication Sig   acetaminophen (TYLENOL) 500 MG tablet Take 500 mg by mouth 2 (two) times daily as needed (for pain).   aluminum hydroxide-magnesium carbonate (GAVISCON) 95-358 MG/15ML SUSP Take by mouth as needed for indigestion or heartburn.   apixaban (ELIQUIS) 5 MG TABS tablet TAKE 1 TABLET BY MOUTH TWICE A DAY   BELBUCA 150 MCG FILM Take by mouth 2 (two) times daily.   cyanocobalamin (VITAMIN B12) 1000 MCG tablet Take 1,000 mcg by mouth daily.   dextromethorphan-guaiFENesin (MUCINEX DM) 30-600 MG 12hr tablet Take 1 tablet by mouth every 12 (twelve) hours as needed for cough.   fexofenadine (ALLEGRA) 60 MG tablet Take 60 mg by mouth daily as needed for allergies.   furosemide (LASIX) 40 MG tablet Take 1 tablet (40 mg total) by mouth daily.   gabapentin (NEURONTIN) 300 MG capsule Take 300 mg by mouth at bedtime.   hydrALAZINE (APRESOLINE) 50 MG tablet TAKE 1 TABLET BY MOUTH THREE TIMES A DAY   HYDROcodone-acetaminophen (NORCO/VICODIN) 5-325 MG tablet Take 1 tablet by mouth daily at 6 (six) AM.   hydrocortisone (ANUSOL-HC) 25 MG suppository Place 25 mg rectally 2 (two) times daily as needed for hemorrhoids.   isosorbide dinitrate (ISORDIL) 10 MG tablet TAKE 1 TABLET BY MOUTH THREE TIMES A DAY   losartan (COZAAR) 100 MG tablet Take 1 tablet (100 mg total) by mouth daily.   meclizine (ANTIVERT) 25 MG tablet Take 25 mg  by mouth 3 (three) times daily as needed for dizziness.   Methen-Hyosc-Meth Blue-Na Phos (UROGESIC-BLUE) 81.6 MG TABS Take 1 tablet by mouth every 6 (six) hours as needed (urinary burning).   metoprolol tartrate (LOPRESSOR) 25 MG tablet Take 1 tablet (25 mg total) by mouth 2 (two) times daily.   nitroGLYCERIN (NITROSTAT) 0.4 MG SL tablet PLACE 1 TABLET UNDER THE TONGE EVERY 5 MINUTES AS NEEDED FOR CHEST PAIN *TAKE UP TO 3 TABLETS*   nystatin cream (MYCOSTATIN) Apply topically 2 (two) times daily.   omeprazole (PRILOSEC) 20 MG capsule Take 20 mg by mouth 2 (two) times daily.   ondansetron (ZOFRAN-ODT) 4 MG disintegrating tablet Take 4 mg by mouth every 8 (eight) hours as needed for  nausea (dissolve orally).   polyethylene glycol powder (GLYCOLAX/MIRALAX) 17 GM/SCOOP powder Take 17 g by mouth daily as needed for mild constipation.   senna (SENOKOT) 8.6 MG tablet Take 1 tablet by mouth as needed for constipation.   spironolactone (ALDACTONE) 25 MG tablet TAKE 1 TABLET (25 MG TOTAL) BY MOUTH DAILY.   sucralfate (CARAFATE) 1 g tablet Take 1 tablet (1 g total) by mouth 4 (four) times daily -  with meals and at bedtime. (Patient taking differently: Take 1 g by mouth daily at 6 (six) AM. 1 tab in am, 1 tab in pm)   triamcinolone cream (KENALOG) 0.1 % Apply 1 application. topically 2 (two) times daily as needed (rash).   [DISCONTINUED] HYDROcodone-acetaminophen (NORCO) 7.5-325 MG tablet Take 1 tablet by mouth every 8 (eight) hours as needed for moderate pain or severe pain.     Allergies:   Moxifloxacin, Quinolones, Sulfa antibiotics, Furosemide, Aspirin, Doxazosin, Duloxetine, Duloxetine hcl, Hydrochlorothiazide, Ibuprofen, Nebivolol hcl, Other, Ranexa [ranolazine], Robaxin [methocarbamol], Topiramate, Xarelto [rivaroxaban], Zofran [ondansetron hcl], Codeine, Dimethyl sulfoxide, Macrodantin [nitrofurantoin macrocrystal], Nitrofurantoin, Tape, and Yellow dye   Social History   Socioeconomic History    Marital status: Single    Spouse name: none   Number of children: 0   Years of education: College   Highest education level: Not on file  Occupational History   Occupation: n/a  Tobacco Use   Smoking status: Never   Smokeless tobacco: Never  Vaping Use   Vaping Use: Never used  Substance and Sexual Activity   Alcohol use: No    Alcohol/week: 0.0 standard drinks of alcohol   Drug use: No   Sexual activity: Not on file  Other Topics Concern   Not on file  Social History Narrative   Lives alone   Sandrea Matte, RN    Caffeine use: 2 glasses tea/day   Brother   Mother passed in ?2015 at 10, father passed in 75's in 2006   She was a Runner, broadcasting/film/video      Social Determinants of Corporate investment banker Strain: Low Risk  (03/22/2022)   Overall Financial Resource Strain (CARDIA)    Difficulty of Paying Living Expenses: Not very hard  Food Insecurity: No Food Insecurity (07/19/2022)   Hunger Vital Sign    Worried About Running Out of Food in the Last Year: Never true    Ran Out of Food in the Last Year: Never true  Transportation Needs: No Transportation Needs (07/19/2022)   PRAPARE - Administrator, Civil Service (Medical): No    Lack of Transportation (Non-Medical): No  Physical Activity: Not on file  Stress: No Stress Concern Present (03/22/2022)   [employer] of Occupational Health - Occupational Stress Questionnaire    Feeling of Stress : Only a little  Social Connections: Moderately Integrated (05/15/2021)   Social Connection and Isolation Panel [NHANES]    Frequency of Communication with Friends and Family: Twice a week    Frequency of Social Gatherings with Friends and Family: Twice a week    Attends Religious Services: 1 to 4 times per year    Active Member of [employer] or Organizations: Yes    Attends Banker Meetings: 1 to 4 times per year    Marital Status: Never married     Family History: The patient's family history includes  Alzheimer's disease in her father; Asthma in her father; CAD in her father; COPD in her father; Cancer in her brother; Colonic polyp in  her mother; Emphysema in her father; Heart failure in her mother; Hypertension in her father and mother; Peripheral vascular disease in her mother; Stroke in her mother.  ROS:   Please see the history of present illness.     All other systems reviewed and are negative.  EKGs/Labs/Other Studies Reviewed:    The following studies were reviewed today:   Cardiac Studies & Procedures   CARDIAC CATHETERIZATION  CARDIAC CATHETERIZATION 09/16/2020  Narrative  Mid LAD lesion is 50% stenosed.  3rd Diag lesion is 60% stenosed.  Dist LAD-1 lesion is 90% stenosed.  Dist LAD-2 lesion is 40% stenosed.  1.  Significant one-vessel coronary artery disease involving the mid to distal LAD at the bifurcation of the third diagonal which has also 60% ostial stenosis.  The coronary arteries are very tortuous suggestive of hypertensive heart disease. 2.  Left ventricular angiography was not performed.  EF was normal by echo. 3.  Severely elevated blood pressure with mildly elevated left ventricular end-diastolic pressure. 4.  Transient left bundle branch block after engaging the left ventricle.  This resolved by the end of the case. 5.  Abdominal aortogram was performed and showed no evidence of renal artery stenosis.  Recommendations: Unfortunately, the LAD stenosis is in a very tortuous segment and at the origin of the third diagonal branch which also has ostial disease.  PCI in this area will be high risk for dissection as well as sidebranch closure especially in the setting of uncontrolled hypertension.  In addition, the distal LAD after the stenosis is diffusely diseased. I recommend maximizing medical therapy and controlling blood pressure before considering PCI.  I added amlodipine to her antihypertensive medications. Other complicating issue is that the patient  cannot tolerate aspirin long-term.  If we decide to proceed with PCI at some point, we will have to make sure she tolerates ticagrelor which can likely be used as monotherapy.  Findings Coronary Findings Diagnostic  Dominance: Right  Left Main Vessel is angiographically normal.  Left Anterior Descending Mid LAD lesion is 50% stenosed. Dist LAD-1 lesion is 90% stenosed. The lesion is mildly calcified. Dist LAD-2 lesion is 40% stenosed.  Third Diagonal Branch 3rd Diag lesion is 60% stenosed.  Left Circumflex The vessel exhibits minimal luminal irregularities.  Right Coronary Artery Vessel is large. Vessel is angiographically normal.  Intervention  No interventions have been documented.   STRESS TESTS  MYOCARDIAL PERFUSION IMAGING 01/12/2022  Narrative   The study is normal. The study is low risk.   No ST deviation was noted.   LV perfusion is normal. There is no evidence of ischemia. There is no evidence of infarction.   Left ventricular function is normal. Nuclear stress EF: 74 %. The left ventricular ejection fraction is hyperdynamic (>65%). End diastolic cavity size is mildly enlarged. End systolic cavity size is normal.   Prior study not available for comparison.   ECHOCARDIOGRAM  ECHOCARDIOGRAM COMPLETE 01/12/2022  Narrative ECHOCARDIOGRAM REPORT    Patient Name:   LEIDI ASTLE Date of Exam: 01/12/2022 Medical Rec #:  [id number]        Height:       64.0 in Accession #:    [id number]       Weight:       195.0 lb Date of Birth:  10-Jun-1945        BSA:          1.935 m Patient Age:    74 years  BP:           138/62 mmHg Patient Gender: F                HR:           63 bpm. Exam Location:  [address]  Procedure: 2D Echo, Cardiac Doppler, Color Doppler, Strain Analysis and 3D Echo  Indications:    I35.0 Aortic stenosis. I25.10 CAD I48.3 Atrial flutter  History:        Patient has prior history of Echocardiogram examinations. CAD, Aortic Valve  Disease, Arrythmias:Atrial Flutter, Signs/Symptoms:2/6 Systolic crescendo murmur; Risk Factors:Hypertension, Dyslipidemia and Sleep Apnea. Previous echo revealed LVEF 70%, mild LVH, trivial MR, moderate AS mean gradient 24 mm Hg, mild dilation of ascending aorta.  Sonographer:    Chanetta Marshall [facility], RDCS Referring Phys: [id number] [facility] A Ashwika Freels  IMPRESSIONS   1. Left ventricular ejection fraction, by estimation, is 65 to 70%. The left ventricle has normal function. The left ventricle has no regional wall motion abnormalities. There is moderate concentric left ventricular hypertrophy. Left ventricular diastolic parameters are consistent with Grade II diastolic dysfunction (pseudonormalization). 2. Right ventricular systolic function is normal. The right ventricular size is normal. 3. Left atrial size was mildly dilated. 4. Right atrial size was mildly dilated. 5. The mitral valve is normal in structure. Trivial mitral valve regurgitation. No evidence of mitral stenosis. 6. The aortic valve is tricuspid. There is moderate calcification of the aortic valve. Aortic valve regurgitation is not visualized. Moderate aortic valve stenosis. Aortic valve area, by VTI measures 1.59 cm. Aortic valve mean gradient measures 30.0 mmHg. Aortic valve Vmax measures 3.82 m/s. 7. Aortic dilatation noted. There is mild dilatation of the ascending aorta, measuring 41 mm. 8. The inferior vena cava is normal in size with greater than 50% respiratory variability, suggesting right atrial pressure of 3 mmHg.  FINDINGS Left Ventricle: Left ventricular ejection fraction, by estimation, is 65 to 70%. The left ventricle has normal function. The left ventricle has no regional wall motion abnormalities. The left ventricular internal cavity size was normal in size. There is moderate concentric left ventricular hypertrophy. Left ventricular diastolic parameters are consistent with Grade II diastolic dysfunction  (pseudonormalization).  Right Ventricle: The right ventricular size is normal. No increase in right ventricular wall thickness. Right ventricular systolic function is normal.  Left Atrium: Left atrial size was mildly dilated.  Right Atrium: Right atrial size was mildly dilated.  Pericardium: There is no evidence of pericardial effusion.  Mitral Valve: The mitral valve is normal in structure. Trivial mitral valve regurgitation. No evidence of mitral valve stenosis.  Tricuspid Valve: The tricuspid valve is normal in structure. Tricuspid valve regurgitation is trivial. No evidence of tricuspid stenosis.  Aortic Valve: The aortic valve is tricuspid. There is moderate calcification of the aortic valve. Aortic valve regurgitation is not visualized. Moderate aortic stenosis is present. Aortic valve mean gradient measures 30.0 mmHg. Aortic valve peak gradient measures 58.4 mmHg. Aortic valve area, by VTI measures 1.59 cm.  Pulmonic Valve: The pulmonic valve was normal in structure. Pulmonic valve regurgitation is trivial. No evidence of pulmonic stenosis.  Aorta: Aortic dilatation noted. There is mild dilatation of the ascending aorta, measuring 41 mm.  Venous: The inferior vena cava is normal in size with greater than 50% respiratory variability, suggesting right atrial pressure of 3 mmHg.  IAS/Shunts: No atrial level shunt detected by color flow Doppler.   LEFT VENTRICLE PLAX 2D LVIDd:  4.20 cm   Diastology LVIDs:         2.20 cm   LV e' medial:    7.94 cm/s LV PW:         1.10 cm   LV E/e' medial:  13.7 LV IVS:        1.50 cm   LV e' lateral:   7.40 cm/s LVOT diam:     2.20 cm   LV E/e' lateral: 14.7 LV SV:         138 LV SV Index:   71        2D Longitudinal Strain LVOT Area:     3.80 cm  2D Strain GLS (A2C):   -23.8 % 2D Strain GLS (A3C):   -21.7 % 2D Strain GLS (A4C):   -25.9 % 2D Strain GLS Avg:     -23.8 %  3D Volume EF: 3D EF:        63 % LV EDV:       145 ml LV  ESV:       54 ml LV SV:        91 ml  RIGHT VENTRICLE RV Basal diam:  3.70 cm RV Mid diam:    3.10 cm RV S prime:     17.70 cm/s TAPSE (M-mode): 3.6 cm  LEFT ATRIUM             Index        RIGHT ATRIUM           Index LA diam:        4.30 cm 2.22 cm/m   RA Area:     15.30 cm LA Vol (A2C):   65.9 ml 34.05 ml/m  RA Volume:   36.10 ml  18.65 ml/m LA Vol (A4C):   52.3 ml 27.02 ml/m LA Biplane Vol: 59.7 ml 30.85 ml/m AORTIC VALVE AV Area (Vmax):    1.41 cm AV Area (Vmean):   1.73 cm AV Area (VTI):     1.59 cm AV Vmax:           382.00 cm/s AV Vmean:          205.200 cm/s AV VTI:            0.867 m AV Peak Grad:      58.4 mmHg AV Mean Grad:      30.0 mmHg LVOT Vmax:         142.00 cm/s LVOT Vmean:        93.500 cm/s LVOT VTI:          0.363 m LVOT/AV VTI ratio: 0.42  AORTA Ao Root diam: 3.10 cm Ao Asc diam:  4.20 cm  MITRAL VALVE MV Area (PHT): 3.54 cm     SHUNTS MV Decel Time: 214 msec     Systemic VTI:  0.36 m MV E velocity: 109.00 cm/s  Systemic Diam: 2.20 cm MV A velocity: 118.00 cm/s MV E/A ratio:  0.92  Arvilla Meres MD Electronically signed by Arvilla Meres MD Signature Date/Time: 01/12/2022/2:39:56 PM    Final     CT SCANS  CT CORONARY MORPH W/CTA COR W/SCORE 09/15/2020  Addendum 09/15/2020  6:28 PM ADDENDUM REPORT: 09/15/2020 18:26  CLINICAL DATA:  77 Year-old Caucasian Female  EXAM: Cardiac/Coronary  CTA  TECHNIQUE: The patient was scanned on a [employer].  FINDINGS: A 100 kV prospective scan was triggered in the descending thoracic aorta at 111 HU's. Axial non-contrast 3 mm slices were carried out through  the heart. The data set was analyzed on a dedicated work station and scored using the Agatson method. Gantry rotation speed was 250 msecs and collimation was .6 mm. No beta blockade and 0.8 mg of sl NTG was given. The 3D data set was reconstructed in 5% intervals of the 67-82 % of the R-R cycle. Diastolic phases  were analyzed on a dedicated work station using MPR, MIP and VRT modes. The patient received 100 cc of contrast.  Aorta: Ascending aorta is mildly dilated for age and body surface area: 40 mm. No calcifications in cardiac field of view. No dissection.  Main Pulmonary Artery: Mild dilation of the pulmonary artery: 32 mm with artery to aorta ratio of < 0.9.  Aortic Valve:  Tri-leaflet.  Mild annular calcification.  Coronary Arteries:  Normal coronary origin.  Right dominance.  Coronary Calcium Score:  Left main: 0  Left anterior descending artery: 156  Left circumflex artery: 0  Right coronary artery: 1  Total: 157  Percentile: 74th for age, sex, and race matched control.  RCA is a large dominant artery that gives rise to PDA and PLA. There is no plaque.  Left main is a large artery that gives rise to LAD and LCX arteries. There is no plaque.  LAD is a large vessel that gives rise to one small D1 Branch. There is a mild non obstructive stenosis (25-49%) calcific plaque in the proximal LAD. There is a moderate stenosis (50-70%) calcific plaque in the mid LAD. There is a moderate stenosis (50-70%) calcific plaque in the distal LAD.  LCX is a non-dominant artery.  There is no plaque.  There is a ramus intermedius vessel with minimal non-obstructive (1-24%) soft plaque in the mid vessel.  Other findings:  Normal pulmonary vein drainage into the left atrium.  Normal left atrial appendage without a thrombus.  Cannot exclude small patent foramen ovale.  Extra-cardiac findings: See attached radiology report for non-cardiac structures.  IMPRESSION: 1. Coronary calcium score of 157. This was 74th percentile for age, sex, and race matched control.  2. Normal coronary origin with right dominance.  3. CAD-RADS 3. Moderate stenosis. Consider symptom-guided anti-ischemic pharmacotherapy as well as risk factor modification per guideline directed care. Additional  analysis with CT FFR will be submitted.  4. Ascending aorta is mildly dilation for age and body surface area: 40 mm.  5. Mild dilation of the pulmonary artery.  6. Cannot exclude small patent foramen ovale.  Reaching out to primary team.  RECOMMENDATIONS:  Coronary artery calcium (CAC) score is a strong predictor of incident coronary heart disease (CHD) and provides predictive information beyond traditional risk factors. CAC scoring is reasonable to use in the decision to withhold, postpone, or initiate statin therapy in intermediate-risk or selected borderline-risk asymptomatic adults (age 5-75 years and LDL-C >=70 to <190 mg/dL) who do not have diabetes or established atherosclerotic cardiovascular disease (ASCVD).* In intermediate-risk (10-year ASCVD risk >=7.5% to <20%) adults or selected borderline-risk (10-year ASCVD risk >=5% to <7.5%) adults in whom a CAC score is measured for the purpose of making a treatment decision the following recommendations have been made:  If CAC = 0, it is reasonable to withhold statin therapy and reassess in 5 to 10 years, as long as higher risk conditions are absent (diabetes mellitus, family history of premature CHD in first degree relatives (males <55 years; females <65 years), cigarette smoking, LDL >=190 mg/dL or other independent risk factors).  If CAC is 1 to 99, it is reasonable  to initiate statin therapy for patients ?75 years of age.  If CAC is >=100 or >=75th percentile, it is reasonable to initiate statin therapy at any age.  Cardiology referral should be considered for patients with CAC scores =400 or >=75th percentile.  *2018 AHA/ACC/AACVPR/AAPA/ABC/ACPM/ADA/AGS/APhA/ASPC/NLA/PCNA Guideline on the Management of Blood Cholesterol: A Report of the American College of Cardiology/American Heart Association Task Force on Clinical Practice Guidelines. J Am Coll Cardiol. 2019;73(24):3168-3209.  Riley Lam,  MD   Electronically Signed By: Riley Lam MD On: 09/15/2020 18:26  Narrative EXAM: OVER-READ INTERPRETATION  CT CHEST  The following report is an over-read performed by radiologist Dr. Jeronimo Greaves of [facility] on 09/15/2020. This over-read does not include interpretation of cardiac or coronary anatomy or pathology. The coronary CTA interpretation by the cardiologist is attached.  COMPARISON:  Chest radiograph of earlier today.  FINDINGS: Vascular: Upper normal ascending aortic caliber, including 3.9 cm on 09/10. Aortic atherosclerosis. No central pulmonary embolism, on this non-dedicated study.  Mediastinum/Nodes: No imaged thoracic adenopathy. Tiny hiatal hernia.  Lungs/Pleura: No pleural fluid.  Clear imaged lungs.  Upper Abdomen: Moderate hepatic steatosis. Normal imaged portions of the spleen.  Musculoskeletal: Mild-to-moderate convex right lower thoracic spinal curvature.  IMPRESSION: 1. No acute findings in the imaged extracardiac chest. 2. Hepatic steatosis. 3. Tiny hiatal hernia. 4. Aortic Atherosclerosis (ICD10-I70.0).  Electronically Signed: By: Jeronimo Greaves M.D. 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 Echo with EF 65-70%, moderate LVH, moderate AS  EXAM: CARDIAC MRI  TECHNIQUE: The patient was scanned on a 1.5 Tesla Siemens magnet. A dedicated cardiac coil was used. Functional imaging was done using Fiesta sequences. 2,3, and 4 chamber views were done to assess for RWMA's. Modified Simpson's rule using a short axis stack was used to calculate an ejection fraction on a dedicated work [employer]. The patient received 12 cc of Gadavist. After 10 minutes inversion recovery sequences were used to assess for infiltration and scar tissue. Phase contrast velocity mapping was performed above the  aortic and pulmonic valves  CONTRAST:  12 cc  of Gadavist  FINDINGS: Left ventricle:  -Moderate asymmetric hypertrophy measuring up to 14mm in basal septum (8mm in posterior wall)  -Normal size  -Hyperdynamic systolic function  -Mild ECV elevation (29%)  -RV insertion site LGE  LV EF:  73% (Normal 52-79%)  Absolute volumes:  LV EDV: (Normal 78-167 mL)  LV ESV: 48mL (Normal 21-64 mL)  LV SV: (Normal 52-114 mL)  CO: 7.0L/min (Normal 2.7-6.3 L/min)  Indexed volumes:  LV EDV: 28mL/sq-m (Normal 50-96 mL/sq-m)  LV ESV: 10mL/sq-m (Normal 10-40 mL/sq-m)  LV SV: 65mL/sq-m (Normal 33-64 mL/sq-m)  CI: 3.5L/min/sq-m (Normal 1.9-3.9 L/min/sq-m)  Right ventricle: Normal size and systolic function  RV EF: 57% (Normal 52-80%)  Absolute volumes:  RV EDV: (Normal 79-175 mL)  RV ESV: 75mL (Normal 13-75 mL)  RV SV: (Normal 56-110 mL)  CO: 5.6L/min (Normal 2.7-6 L/min)  Indexed volumes:  RV EDV: 21mL/sq-m (Normal 51-97 mL/sq-m)  RV ESV: 2mL/sq-m (Normal 9-42 mL/sq-m)  RV SV: 37mL/sq-m (Normal 35-61 mL/sq-m)  CI: 2.7L/min/sq-m (Normal 1.8-3.8 L/min/sq-m)  Left atrium: Mild enlargement  Right atrium: Normal size  Mitral valve: Trivial regurgitation  Aortic valve: No regurgitation  Tricuspid valve: Trivial regurgitation  Pulmonic valve: No regurgitation  Aorta: Dilated ascending aorta measuring 40mm  Pulmonary artery: Dilated  main PA measuring 31mm  Pericardium: Normal  IMPRESSION: 1.  No evidence of cardiac amyloidosis  2. Moderate asymmetric LV hypertrophy measuring up to 14mm in basal septum (8mm in posterior wall), not meeting criteria for hypertrophic cardiomyopathy (<47mm)  3.  Normal LV size with hyperdynamic systolic function (EF 73%)  4.  Normal RV size and systolic function (EF 57%)  5. RV insertion site LGE, which is a nonspecific scar pattern often seen in setting of elevated pulmonary pressures  6.  Dilated  ascending aorta measuring 40mm  7.  Dilated main pulmonary artery measuring 31mm   Electronically Signed By: Epifanio Lesches M.D. On: 07/26/2022 22:10   PYP SCAN  MYOCARDIAL AMYLOID PLANAR AND SPECT 03/29/2022  Narrative   By semi-quantitative assessment scan is consistent with heart uptake equal to rib uptake-Grade 2. Heart to contralateral lung ratio is between 1-1.5, indeterminate for amyloid.   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   Prior study not available for comparison.  Conclusion  2) Strongly suggestive of TTR amyloidosis or (Strongly suggestive: A semi-quantitative visual score of 2 or 3 or H/CL ratio >1.5)  **If echo/CMR are strongly positive, and negative, consider further evaluation including endomyocardial biopsy  (Note: A negative or mildly positive PYP does not exclude AL amyloid. In addition, equivocal results could represent AL amyloid or early TTR amyloid)  47mTechnetium-Pyrophosphate Imaging  for Transthyretin Cardiac Amyloidosis - [facility] Practice Points, Zachery Dakins, et. Al.         Recent Labs: 08/24/2021: ALT 19 08/27/2021: Magnesium 2.2 06/08/2022: BUN 17; Creatinine, Ser 1.05; Hemoglobin 8.7; Platelets 330; Potassium 4.6; Sodium 142  Recent Lipid Panel    Component Value Date/Time   CHOL 132 01/11/2021 0223   TRIG 136 01/11/2021 0223   HDL 41 01/11/2021 0223   CHOLHDL 3.2 01/11/2021 0223   VLDL 27 01/11/2021 0223   LDLCALC 64 01/11/2021 0223   LDLDIRECT 137 (H) 03/28/2022 1243        Physical Exam:    VS:  BP (!) 120/55   Pulse (!) 57   Ht 5\' 3"  (1.6 m)   Wt 205 lb 9.6 oz (93.3 kg)   SpO2 94%   BMI 36.42 kg/m     Wt Readings from Last 3 Encounters:  08/02/22 205 lb 9.6 oz (93.3 kg)  03/28/22 201 lb (91.2 kg)  01/31/22 201 lb (91.2 kg)    GEN: Morbid obesity NAD HEENT: Normal NECK: No JVD CARDIAC: regular bradycardia, no rubs, gallops, harsh systolic murmur RESPIRATORY:   Clear to auscultation without rales, wheezing or rhonchi  ABDOMEN: Soft, non-tender, non-distended MUSCULOSKELETAL:  Painful bilateral non pitting edema; No deformity  SKIN: Warm and dry NEUROLOGIC:  Alert and oriented x 3 PSYCHIATRIC:  Normal affect   ASSESSMENT:    1. Aortic valve disorder   2. Primary hypertension   3. Coronary artery disease of native artery of native heart with stable angina pectoris (HCC)   4. Atrial flutter, unspecified type (HCC)    PLAN:     IDA Paroxysmal AFL CHADSVASC 4 - we are hold DOAC until cleared by GI - she is at moderate risk for procedures, but tolerated EGD in 2023; we are unable to be more aggressive with CAD or AFL due to anemia - would recommend proceeding with GI eval - sees EP (Dr. 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; will get ATTR genetic testing and will discuss repeat PYP in 6 months vs Amyloid clinic referral - defer Progress Trial at this time  - Echo this fall - starting lasix 40 mg PO daily; stopping torsemide  Morbid obesity and HLD - seeing Pharm D clinic, likely try intermittent statin  Aortic atherosclerosis - LDL at goal    HTN - continue current medications   Coronary Artery Disease; Obstructive distal disease  - asymptomatic - has ASA allergy of only nausea, could re-challenge if needed for obstructive disease in the future    Time Spent Directly with Patient:   I have spent a total of 74 minutes with the patient reviewing notes, imaging, EKGs, labs and examining the patient as well as establishing an assessment and plan that was discussed personally with the patient.  > 50% of time was spent in direct patient care.            Medication Adjustments/Labs and Tests Ordered: Current medicines are reviewed at length with the patient today.  Concerns regarding medicines are outlined above.  Orders Placed This Encounter  Procedures   ECHOCARDIOGRAM COMPLETE   Meds ordered this encounter  Medications   furosemide (LASIX) 40 MG tablet    Sig: Take 1 tablet (40 mg total) by mouth daily.    Dispense:  90 tablet    Refill:  3    Patient Instructions  Medication Instructions:  Your physician has recommended you make the following change in your medication:  STOP: Torsemide  START: furosemide (Lasix) 40 mg by mouth once daily  HOLD: Eliquis until cleared by GI *If you need a refill on your cardiac medications before your next appointment, please call your pharmacy*   Lab Work: AT Outside provider: BMP, BNP  If you have labs (blood work) drawn today and your tests are completely normal, you will receive your results only by: MyChart Message (if you have MyChart) OR A paper copy in the mail If you have any lab test that is abnormal or we need to change your treatment, we will call you to review the results.   Testing/Procedures: OCT 2024: Your physician has requested that you have an echocardiogram. Echocardiography is a painless test that uses sound waves to create images of your heart. It provides your doctor with information about the size and shape of your heart and how well your heart's chambers and valves are working. This procedure takes approximately one hour. There are no restrictions for this procedure. Please do NOT wear cologne, perfume, aftershave, or lotions (deodorant is allowed). Please arrive 15 minutes prior to your appointment time.   We have provided you with an Invitae Saliva Collection Kit.  Please do not use until instructed to do so.   Follow-Up: At [facility], you and your health needs are our priority.  As part of our continuing mission to provide you with  exceptional heart care, we have created designated Provider Care Teams.  These Care Teams include your primary Cardiologist (physician) and Advanced Practice Providers (APPs -  Physician Assistants and Nurse Practitioners) who all work together to provide you with the care you need, when you need it.  We recommend signing up for the patient portal called "MyChart".  Sign up information is provided on  this After Visit Summary.  MyChart is used to connect with patients for Virtual Visits (Telemedicine).  Patients are able to view lab/test results, encounter notes, upcoming appointments, etc.  Non-urgent messages can be sent to your provider as well.   To learn more about what you can do with MyChart, go to [email or web address].    Your next appointment:   5-6 month(s)  Provider:   Christell Constant, MD       Signed, Christell Constant, MD  08/02/2022 6:36 PM    [facility]

## 2022-08-02 ENCOUNTER — Ambulatory Visit: Payer: Medicare Other | Admitting: Psychiatry

## 2022-08-02 ENCOUNTER — Encounter: Payer: Self-pay | Admitting: Internal Medicine

## 2022-08-02 ENCOUNTER — Ambulatory Visit: Payer: Medicare Other | Attending: Internal Medicine | Admitting: Internal Medicine

## 2022-08-02 ENCOUNTER — Ambulatory Visit (INDEPENDENT_AMBULATORY_CARE_PROVIDER_SITE_OTHER): Payer: Medicare Other | Admitting: Pharmacist

## 2022-08-02 VITALS — BP 120/55 | HR 57 | Ht 63.0 in | Wt 205.6 lb

## 2022-08-02 DIAGNOSIS — I359 Nonrheumatic aortic valve disorder, unspecified: Secondary | ICD-10-CM | POA: Diagnosis not present

## 2022-08-02 DIAGNOSIS — I4892 Unspecified atrial flutter: Secondary | ICD-10-CM | POA: Diagnosis not present

## 2022-08-02 DIAGNOSIS — I25118 Atherosclerotic heart disease of native coronary artery with other forms of angina pectoris: Secondary | ICD-10-CM | POA: Insufficient documentation

## 2022-08-02 DIAGNOSIS — I10 Essential (primary) hypertension: Secondary | ICD-10-CM | POA: Diagnosis not present

## 2022-08-02 DIAGNOSIS — E7800 Pure hypercholesterolemia, unspecified: Secondary | ICD-10-CM | POA: Diagnosis not present

## 2022-08-02 MED ORDER — FUROSEMIDE 40 MG PO TABS: 40.0000 mg | ORAL_TABLET | Freq: Every day | ORAL | 3 refills | Status: DC

## 2022-08-02 MED ORDER — ROSUVASTATIN CALCIUM 5 MG PO TABS: 5.0000 mg | ORAL_TABLET | ORAL | 3 refills | Status: DC

## 2022-08-02 NOTE — Progress Notes (Signed)
Patient ID: Jennifer [name]                 DOB: 02/05/1946                      MRN: [id number]     HPI:  (9/9 - visit with Efraim Kaufmann) Jennifer [name] is a 77 y.o. female referred by Dr. [name] to HTN clinic. PMH is significant for HTN, CAD with 90% distal LAD, PVC, HLD, fibromyalgia, chronic pain syndrome with lumbar stenosis. Most recent ECHO on 12/2020 showed EF 60-65% with severe concentric LVH  On 09/15/2020, patient was sent to ED by Dr. [name] due to chest discomfort and HTN urgency (BP 246/86) with chest pain, SOB, and headaches and was admitted to the hospital 6/16 - 09/27/2020. 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 HTN medications that the patient was discharged with include hydralazine 25mg  TID, doxazosin 1mg  daily and spironolactone 25mg  daily. Was on amlodipine 10mg  daily during hospital stay but was discharged on nifedipine but was stopped due to concern over possible allergy.   Patient was seen by me in the HTN clinic on 12/09/20. Hydralazine was decreased to 50mg  TID due to complaints that it made her heart pound. Doxazosin was discontinued due to concerns it was contributing to her weakness. Finally ranolazine was discontinued due to heart pounding and constipation.  She was given a blood pressure cuff and taught how to use it, but she later called and stated she couldn't get it to work.   On 10/10-13/2022, patient was hospitalized with chest pain, SOB and palpitations and was hospitalized from 10/11-13  She reported that her SBP the previous day was SBP 220 with no relief from 2 x SL nitro and called EMS. She was diagnosed with new onset atrial flutter with elevated troponin likely due to demand ischemia and pt was discharged on lopressor, Xarelto, and fosfomycin for cystitis flare-up while admitted.   I saw patient in office 12/09/20 and 01/24/21. No medication changes were made at  the 10/25 visit as blood pressure was well controlled. She expressed some concern about her atorvastatin. It was held to see if her pain improved.  Patient has been off of Lipitor since.  Last direct LDL-C was 137.  Blood pressure has been well-controlled since last visit.   Current lipid meds: None Previously tried: Atorvastatin 20 mg (patient thought it made her fibromyalgia worse ) LDL-C goal: <70   Family History: The patient's family history includes Alzheimer's disease in her father; Asthma in her father; CAD in her father; COPD in her father; Cancer in her brother; Colonic polyp in her mother; Emphysema in her father; Heart failure in her mother; Hypertension in her father and mother; Peripheral vascular disease in her mother; Stroke in her mother.   Social History: Lives alone.   Diet: Caffeine: drinks 2 glasses of tea/day.   Exercise: not assess at todays visit    Direct LDL-C 137  Wt Readings from Last 3 Encounters:  08/02/22 205 lb 9.6 oz (93.3 kg)  03/28/22 201 lb (91.2 kg)  01/31/22 201 lb (91.2 kg)   BP Readings from Last 3 Encounters:  08/02/22 (!) 120/55  01/31/22 (!) 148/60  09/21/21 (!) 148/81   Pulse Readings from Last 3 Encounters:  08/02/22 (!) 57  01/31/22 60  09/21/21 65    Renal function: CrCl cannot be calculated (Patient's  most recent lab result is older than the maximum 21 days allowed.).  Past Medical History:  Diagnosis Date   Acute meniscal tear of left knee    FOLLOWED BY DR GIAFFREY   Anemia    Aortic stenosis    Ascending aorta dilation (HCC)    Atrial flutter (HCC)    AV block, Mobitz 1    BPPV (benign paroxysmal positional vertigo)    CAD (coronary artery disease)    Chronic bladder pain    Chronic fatigue syndrome    Chronic low back pain    W/ RIGHT LEG PAIN AND NUMBNESS   Cystitis, chronic    Fibromyalgia    GERD (gastroesophageal reflux disease)    Hiatal hernia    Hypertension    IC (interstitial cystitis)    LBBB (left  bundle branch block)    transient hx   OSA (obstructive sleep apnea)    per pt study yrs ago-- moderate osa ,  intolerant cpap   Pinched vertebral nerve    bilateral L2 -- L3 and L3 -- L4-/  epidural injection's treatment , PT and pain clince   PONV (postoperative nausea and vomiting)    severe   PVC's (premature ventricular contractions)    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     aluminum hydroxide-magnesium carbonate (GAVISCON) 95-358 MG/15ML SUSP Take by mouth as needed for indigestion or heartburn.     apixaban (ELIQUIS) 5 MG TABS tablet TAKE 1 TABLET BY MOUTH TWICE A DAY 180 tablet 1   BELBUCA 150 MCG FILM Take by mouth 2 (two) times daily.     cyanocobalamin (VITAMIN B12) 1000 MCG tablet Take 1,000 mcg by mouth daily.     dextromethorphan-guaiFENesin (MUCINEX DM) 30-600 MG 12hr tablet Take 1 tablet by mouth every 12 (twelve) hours as needed for cough.     fexofenadine (ALLEGRA) 60 MG tablet Take 60 mg by mouth daily as needed for allergies.     furosemide (LASIX) 40 MG tablet Take 1 tablet (40 mg total) by mouth daily. 90 tablet 3   gabapentin (NEURONTIN) 300 MG capsule Take 300 mg by mouth at bedtime.     hydrALAZINE (APRESOLINE) 50 MG tablet TAKE 1 TABLET BY MOUTH THREE TIMES A DAY 270 tablet 1   HYDROcodone-acetaminophen (NORCO/VICODIN) 5-325 MG tablet Take 1 tablet by mouth daily at 6 (six) AM.     hydrocortisone (ANUSOL-HC) 25 MG suppository Place 25 mg rectally 2 (two) times daily as needed for hemorrhoids.     isosorbide dinitrate (ISORDIL) 10 MG tablet TAKE 1 TABLET BY MOUTH THREE TIMES A  DAY 270 tablet 1   losartan (COZAAR) 100 MG tablet Take 1 tablet (100 mg total) by mouth daily. 90 tablet 3   meclizine (ANTIVERT) 25 MG tablet Take 25 mg by mouth 3 (three) times daily as needed for dizziness.     Methen-Hyosc-Meth Blue-Na Phos (UROGESIC-BLUE) 81.6 MG TABS Take 1 tablet by mouth every 6 (six) hours as needed (urinary burning).     metoprolol tartrate (LOPRESSOR) 25 MG tablet  Take 1 tablet (25 mg total) by mouth 2 (two) times daily. 180 tablet 1   nitroGLYCERIN (NITROSTAT) 0.4 MG SL tablet PLACE 1 TABLET UNDER THE TONGE EVERY 5 MINUTES AS NEEDED FOR CHEST PAIN *TAKE UP TO 3 TABLETS* 25 tablet 4   nystatin cream (MYCOSTATIN) Apply topically 2 (two) times daily. 30 g 3   omeprazole (PRILOSEC) 20 MG capsule Take 20 mg by mouth 2 (two) times daily.     ondansetron (ZOFRAN-ODT) 4 MG disintegrating tablet Take 4 mg by mouth every 8 (eight) hours as needed for nausea (dissolve orally).  1   polyethylene glycol powder (GLYCOLAX/MIRALAX) 17 GM/SCOOP powder Take 17 g by mouth daily as needed for mild constipation.     senna (SENOKOT) 8.6 MG tablet Take 1 tablet by mouth as needed for constipation.     spironolactone (ALDACTONE) 25 MG tablet TAKE 1 TABLET (25 MG TOTAL) BY MOUTH DAILY. 90 tablet 3   sucralfate (CARAFATE) 1 g tablet Take 1 tablet (1 g total) by mouth 4 (four) times daily -  with meals and at bedtime. (Patient taking differently: Take 1 g by mouth daily at 6 (six) AM. 1 tab in am, 1 tab in pm) 120 tablet 3   triamcinolone cream (KENALOG) 0.1 % Apply 1 application. topically 2 (two) times daily as needed (rash).     No current facility-administered medications on file prior to visit.    Allergies  Allergen Reactions   Moxifloxacin Hives, Shortness Of Breath, Swelling and Rash    Rash was severe, caused tremors, ans skin became BLOODY RED   Quinolones Anaphylaxis   Sulfa Antibiotics Hives and Rash   Furosemide Other (See Comments)    CANNOT TAKE DUE TO Interstitial cystitis    Aspirin Nausea Only and Other (See Comments)    GI and stomach upset   Doxazosin     constipation   Duloxetine     Other reaction(s): Other (See Comments)   Duloxetine Hcl Nausea Only and Other (See Comments)    Vertigo and heart racing also    Hydrochlorothiazide Other (See Comments)    Headaches    Ibuprofen Nausea Only and Other (See Comments)    GI upset   Nebivolol Hcl     Other reaction(s): extreme fatigue   Other Other (See Comments)   Ranexa [Ranolazine]     Constipation/heart pounding   Robaxin [Methocarbamol] Other (See Comments)    "Make me feel flu-like symptoms"   Topiramate Itching   Xarelto [Rivaroxaban]     Back pain   Zofran [Ondansetron Hcl] Other (See Comments)    Headaches    Codeine Hives, Nausea And Vomiting and Rash   Dimethyl Sulfoxide Hives and Itching   Macrodantin [Nitrofurantoin Macrocrystal] Itching, Nausea And Vomiting and Rash    Abdominal and chest pain, also- blood-red skin, also   Nitrofurantoin Diarrhea, Itching, Rash and Other (See Comments)    GI/stomach upset, sweating, chest pain, and skin turned bloody red   Tape Rash   Yellow Dye Rash    There were no vitals taken for this visit.   Assessment/Plan:   1. HLD- LDL is above goal of less than 70.  Discussed other statins with less possibility of causing muscle issues.  Patient prefers not to take the medication daily.  Therefore we discussed using rosuvastatin 3 times a week and increasing if tolerating.  Will start rosuvastatin 5 mg 3 times a week.  Patient has a difficult time with transportation therefore we will  try to coordinate labs with another appointment.  If she happens to see her primary care doctor before she sees [place] she will ask her primary care doctor to do the labs.  If not she will get her labs done when she is here for her echo in October.  Patient to call if there is any issues with medication.  Thank you  Olene Floss, Pharm.D, BCPS, CPP [facility]  [address] [place], [place] [address]  Phone: [phone]; Fax: [phone]

## 2022-08-02 NOTE — Patient Instructions (Addendum)
Medication Instructions:  Your physician has recommended you make the following change in your medication:  STOP: Torsemide  START: furosemide (Lasix) 40 mg by mouth once daily  HOLD: Eliquis until cleared by GI *If you need a refill on your cardiac medications before your next appointment, please call your pharmacy*   Lab Work: AT Outside provider: BMP, BNP  If you have labs (blood work) drawn today and your tests are completely normal, you will receive your results only by: MyChart Message (if you have MyChart) OR A paper copy in the mail If you have any lab test that is abnormal or we need to change your treatment, we will call you to review the results.   Testing/Procedures: OCT 2024: Your physician has requested that you have an echocardiogram. Echocardiography is a painless test that uses sound waves to create images of your heart. It provides your doctor with information about the size and shape of your heart and how well your heart's chambers and valves are working. This procedure takes approximately one hour. There are no restrictions for this procedure. Please do NOT wear cologne, perfume, aftershave, or lotions (deodorant is allowed). Please arrive 15 minutes prior to your appointment time.   We have provided you with an Invitae Saliva Collection Kit.  Please do not use until instructed to do so.   Follow-Up: At [facility], you and your health needs are our priority.  As part of our continuing mission to provide you with exceptional heart care, we have created designated Provider Care Teams.  These Care Teams include your primary Cardiologist (physician) and Advanced Practice Providers (APPs -  Physician Assistants and Nurse Practitioners) who all work together to provide you with the care you need, when you need it.  We recommend signing up for the patient portal called "MyChart".  Sign up information is provided on this After Visit Summary.  MyChart is used to  connect with patients for Virtual Visits (Telemedicine).  Patients are able to view lab/test results, encounter notes, upcoming appointments, etc.  Non-urgent messages can be sent to your provider as well.   To learn more about what you can do with MyChart, go to [email or web address].    Your next appointment:   5-6 month(s)  Provider:   Christell Constant, MD

## 2022-08-02 NOTE — Patient Instructions (Addendum)
Start taking rosuvastatin three times a week Call me with any issues You can have your PCP check labs or we can check them in October

## 2022-08-02 NOTE — Telephone Encounter (Signed)
Patient with diagnosis of afib on Eliquis for anticoagulation.    Procedure: COLONOSCOPY/ENDOSCOPY  Date of procedure: 08/15/22   CHA2DS2-VASc Score = 5   This indicates a 7.2% annual risk of stroke. The patient's score is based upon: CHF History: 1 HTN History: 1 Diabetes History: 0 Stroke History: 0 Vascular Disease History: 1 Age Score: 1 Gender Score: 1      CrCl 51 ml/min  Per office protocol, patient can hold Eliquis for 2 days prior to procedure.    **This guidance is not considered finalized until pre-operative APP has relayed final recommendations.**

## 2022-08-03 NOTE — Telephone Encounter (Signed)
     Primary Cardiologist: Christell Constant, MD  Chart reviewed as part of pre-operative protocol coverage. Given past medical history and time since last visit, based on ACC/AHA guidelines, Jennifer [name] would be at acceptable risk for the planned procedure without further cardiovascular testing.   Patient with diagnosis of afib on Eliquis for anticoagulation.     Procedure: COLONOSCOPY/ENDOSCOPY  Date of procedure: 08/15/22     CHA2DS2-VASc Score = 5   This indicates a 7.2% annual risk of stroke. The patient's score is based upon: CHF History: 1 HTN History: 1 Diabetes History: 0 Stroke History: 0 Vascular Disease History: 1 Age Score: 1 Gender Score: 1       CrCl 51 ml/min   Per office protocol, patient can hold Eliquis for 2 days prior to procedure.  I will route this recommendation to the requesting party via Epic fax function and remove from pre-op pool.  Please call with questions.  Thomasene Ripple. Bauer Ausborn NP-C     08/03/2022, 7:52 AM [facility] [address] Office [phone] Fax [phone]

## 2022-08-06 ENCOUNTER — Telehealth: Payer: Self-pay | Admitting: Internal Medicine

## 2022-08-06 NOTE — Telephone Encounter (Signed)
I will forward this to pre op APP and MD to see if pt has been cleared yet as well as medications to be held noted.

## 2022-08-06 NOTE — Telephone Encounter (Signed)
Follow Up:     Patient said she would like for you to call today if possible.. She says she needs to know when is she  supposed to stop her Eliquis.. She said she is confused about what she was told.

## 2022-08-06 NOTE — Telephone Encounter (Signed)
I called the pt after discussing with Dr. [name] and pre op APP today. Pt tells me that she wanted to make sure she was following directions correctly from what Dr. [name] had advised her of holding Eliquis as of her appt last 08/02/22. Pt was concerned that was a long time to hold her Eliquis. She stated the GI office told her that they did get the clearance and it said to only hold the Eliquis x 2-3 days. I stated that the reason was the clearance notes that Edd Fabian, FNP had sent he did not have the information about the HGB being low. I assured her that Dr. [name], Dr. Marca Ancona and Dr. Al Pimple did discuss the safest plan of care for her at this time would be to hold the Eliquis as stated at the 08/02/22 appt with Dr. [name]. After speaking with the pt x 28 minutes and answering all of her questions she thanked me for the help and returning her call today. Pt wanted to make sure she was doing the right thing with her Eliquis. She states to me that she saw Dr. [name] 08/02/22, though he told her to stop her Eliquis then, she chose to finish Thurs dose last week. She tells me that she has not taken any Elquis since Friday 08/03/22. I assured her that is correct and to remain off Eliquis until her procedure is done. Pt has been advised Dr. Marca Ancona will let her know when it is safe to resume Eliquis. Pt thanked me again for all of my help.

## 2022-08-07 ENCOUNTER — Inpatient Hospital Stay: Payer: Medicare Other

## 2022-08-07 ENCOUNTER — Other Ambulatory Visit: Payer: Self-pay

## 2022-08-07 ENCOUNTER — Inpatient Hospital Stay: Payer: Medicare Other | Attending: Hematology and Oncology | Admitting: Hematology and Oncology

## 2022-08-07 VITALS — BP 213/60 | HR 73 | Temp 98.1°F | Resp 16 | Wt 204.8 lb

## 2022-08-07 DIAGNOSIS — D509 Iron deficiency anemia, unspecified: Secondary | ICD-10-CM | POA: Insufficient documentation

## 2022-08-07 DIAGNOSIS — Z79899 Other long term (current) drug therapy: Secondary | ICD-10-CM | POA: Diagnosis not present

## 2022-08-07 LAB — CBC WITH DIFFERENTIAL/PLATELET
Abs Immature Granulocytes: 0.02 10*3/uL (ref 0.00–0.07)
Basophils Absolute: 0 10*3/uL (ref 0.0–0.1)
Basophils Relative: 0 %
Eosinophils Absolute: 0.1 10*3/uL (ref 0.0–0.5)
Eosinophils Relative: 2 %
HCT: 32.5 % — ABNORMAL LOW (ref 36.0–46.0)
Hemoglobin: 9.8 g/dL — ABNORMAL LOW (ref 12.0–15.0)
Immature Granulocytes: 0 %
Lymphocytes Relative: 20 %
Lymphs Abs: 1.6 10*3/uL (ref 0.7–4.0)
MCH: 24.5 pg — ABNORMAL LOW (ref 26.0–34.0)
MCHC: 30.2 g/dL (ref 30.0–36.0)
MCV: 81.3 fL (ref 80.0–100.0)
Monocytes Absolute: 0.7 10*3/uL (ref 0.1–1.0)
Monocytes Relative: 8 %
Neutro Abs: 5.8 10*3/uL (ref 1.7–7.7)
Neutrophils Relative %: 70 %
Platelets: 270 10*3/uL (ref 150–400)
RBC: 4 MIL/uL (ref 3.87–5.11)
RDW: 20.4 % — ABNORMAL HIGH (ref 11.5–15.5)
WBC: 8.2 10*3/uL (ref 4.0–10.5)
nRBC: 0 % (ref 0.0–0.2)

## 2022-08-07 LAB — IRON AND IRON BINDING CAPACITY (CC-WL,HP ONLY)
Iron: 52 ug/dL (ref 28–170)
Saturation Ratios: 11 % (ref 10.4–31.8)
TIBC: 456 ug/dL — ABNORMAL HIGH (ref 250–450)
UIBC: 404 ug/dL (ref 148–442)

## 2022-08-07 NOTE — Progress Notes (Unsigned)
[facility] CONSULT NOTE  Patient Care Team: Jarrett Soho, PA-C as PCP - General (Family Medicine) Christell Constant, MD as PCP - Cardiology (Cardiology) Lanier Prude, MD as PCP - Electrophysiology (Cardiology) Jamison Neighbor, MD as Consulting Physician (Urology) Denice Paradise as Pharmacist Allena Katz, Janus Delene Ruffini, MD as Referring Physician (Anesthesiology) Anthonette Legato, Dell Ponto (Physician Assistant)  CHIEF COMPLAINTS/PURPOSE OF CONSULTATION:  IDA  ASSESSMENT & PLAN:   This is a pleasant 77 year old female patient with past medical history significant for urinary bladder replacement, atrial flutter referred to hematology for iron deficiency anemia.  She apparently had FOBT positive and has not scheduled for endoscopy and colonoscopy.  She reports feeling weak, winded and some palpitations.  No concerns on physical exam.  I agree with GI evaluation to explain the source for the iron deficiency anemia.  She has been taking oral iron 65 mg p.o. twice a day as well as B12 1000 mcg daily for the past 2 weeks. Repeat labs done on 5 7 showed significant improvement in hemoglobin to 9.8 g, improvement in microcytosis, normal white blood cell count and platelet count.  Ferritin also reported as normal. At this time she can continue oral iron twice a day and return to clinic in about 8 weeks.  We can consider IV infusion if she continues to be clinically symptomatic and does not have significant improvement in iron deficiency anemia.  There is no indication for urgent blood transfusion at this time.  Thank you for consulting [place] in the care of this patient.  Please not hesitate to contact us with any additional questions or concerns.  HISTORY OF PRESENTING ILLNESS:  Jennifer [name] 77 y.o. female is here because of IDA.  This is a very pleasant 77 yr old female patient with PMH of urinary bladder replacement, atrial flutter, referred to hematology for  IDA. She had FOBT positive with PCP, already scheduled for endoscopy and colonoscopy on Dr Marca Ancona next Wednesday. She had hysterectomy in 1980. She has GERD and takes omeprazole and sucralfate. She denies any stomach surgeries Last colonoscopy in 2006. No FH of colon cancer.  From symptom standpoint, she says she has been dragging, severely weak. She feels winded, palpitations. She has noticed minor amount of blood in urine. No blood noticed in stool. No hematochezia or melena. She has now been taking oral iron ferrous sulfate 65 mg PO BID and B12 1000 mcg daily.  Rest of the pertinent 10 point ROS reviewed and negative.  MEDICAL HISTORY:  Past Medical History:  Diagnosis Date   Acute meniscal tear of left knee    FOLLOWED BY DR GIAFFREY   Anemia    Aortic stenosis    Ascending aorta dilation (HCC)    Atrial flutter (HCC)    AV block, Mobitz 1    BPPV (benign paroxysmal positional vertigo)    CAD (coronary artery disease)    Chronic bladder pain    Chronic fatigue syndrome    Chronic low back pain    W/ RIGHT LEG PAIN AND NUMBNESS   Cystitis, chronic    Fibromyalgia    GERD (gastroesophageal reflux disease)    Hiatal hernia    Hypertension    IC (interstitial cystitis)    LBBB (left bundle branch block)    transient hx   OSA (obstructive sleep apnea)    per pt study yrs ago-- moderate osa ,  intolerant cpap   Pinched vertebral nerve    bilateral L2 --  L3 and L3 -- L4-/  epidural injection's treatment , PT and pain clince   PONV (postoperative nausea and vomiting)    severe   PVC's (premature ventricular contractions)    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    Self-catheterizes urinary bladder    Spinal stenosis, lumbar region with neurogenic claudication    Wears glasses    Wears partial dentures    upper and lower    SURGICAL HISTORY: Past Surgical History:  Procedure Laterality Date   ABDOMINAL AORTOGRAM N/A 09/16/2020   Procedure:  ABDOMINAL AORTOGRAM;  Surgeon: Iran Ouch, MD;  Location: [facility] INVASIVE CV LAB;  Service: Cardiovascular;  Laterality: N/A;   ABDOMINAL HYSTERECTOMY  1980   w/ Left Salpingoophorectomy   BIOPSY  08/27/2021   Procedure: BIOPSY;  Surgeon: Vida Rigger, MD;  Location: [facility] ENDOSCOPY;  Service: Gastroenterology;;   CARDIAC CATHETERIZATION  08-21-2001  dr Verdis Prime   normal coronary arteries and LVF   CARPAL TUNNEL RELEASE Bilateral 1986   CECALCYSTOPLASTY/  APPENDECTOMY  1984   at [facility]   CHOLECYSTECTOMY  1992   CYSTO WITH HYDRODISTENSION N/A 01/12/2016   Procedure: CYSTOSCOPY/HYDRODISTENSION;  Surgeon: Jethro Bolus, MD;  Location: [facility];  Service: Urology;  Laterality: N/A;   CYSTO WITH HYDRODISTENSION N/A 08/30/2016   Procedure: CYSTOSCOPY/HYDRODISTENSION OF BLADDER INJECTION OF MARCAINE/PYRIDIUM;  Surgeon: Jethro Bolus, MD;  Location: [facility];  Service: Urology;  Laterality: N/A;   ESOPHAGOGASTRODUODENOSCOPY (EGD) WITH PROPOFOL N/A 08/27/2021   Procedure: ESOPHAGOGASTRODUODENOSCOPY (EGD) WITH PROPOFOL;  Surgeon: Vida Rigger, MD;  Location: [facility] ENDOSCOPY;  Service: Gastroenterology;  Laterality: N/A;   LEFT HEART CATH AND CORONARY ANGIOGRAPHY N/A 09/16/2020   Procedure: LEFT HEART CATH AND CORONARY ANGIOGRAPHY;  Surgeon: Iran Ouch, MD;  Location: [facility] CV [facility];  Service: Cardiovascular;  Laterality: N/A;   MULTIPLE CYSTO/  HYDRODISTENTION/  INSTILLATION THERAPY  last one 08-19-2009   NASAL SINUS SURGERY  1987   TONSILLECTOMY  as child   VAGINAL GROWTH REMOVED  as teen    SOCIAL HISTORY: Social History   Socioeconomic History   Marital status: Single    Spouse name: none   Number of children: 0   Years of education: College   Highest education level: Not on file  Occupational History   Occupation: n/a  Tobacco Use   Smoking status: Never   Smokeless tobacco: Never  Vaping Use   Vaping Use: Never used   Substance and Sexual Activity   Alcohol use: No    Alcohol/week: 0.0 standard drinks of alcohol   Drug use: No   Sexual activity: Not on file  Other Topics Concern   Not on file  Social History Narrative   Lives alone   Sandrea Matte, RN    Caffeine use: 2 glasses tea/day   Brother   Mother passed in ?58 at 32, father passed in 51's in 2006   She was a Runner, broadcasting/film/video      Social Determinants of Corporate investment banker Strain: Low Risk  (03/22/2022)   Overall Financial Resource Strain (CARDIA)    Difficulty of Paying Living Expenses: Not very hard  Food Insecurity: No Food Insecurity (07/19/2022)   Hunger Vital Sign    Worried About Running Out of Food in the Last Year: Never true    Ran Out of Food in the Last Year: Never true  Transportation Needs: No Transportation Needs (07/19/2022)  PRAPARE - Administrator, Civil Service (Medical): No    Lack of Transportation (Non-Medical): No  Physical Activity: Not on file  Stress: No Stress Concern Present (03/22/2022)   [employer] of Occupational Health - Occupational Stress Questionnaire    Feeling of Stress : Only a little  Social Connections: Moderately Integrated (05/15/2021)   Social Connection and Isolation Panel [NHANES]    Frequency of Communication with Friends and Family: Twice a week    Frequency of Social Gatherings with Friends and Family: Twice a week    Attends Religious Services: 1 to 4 times per year    Active Member of [employer] or Organizations: Yes    Attends Banker Meetings: 1 to 4 times per year    Marital Status: Never married  Intimate Partner Violence: Not At Risk (05/15/2021)   Humiliation, Afraid, Rape, and Kick questionnaire    Fear of Current or Ex-Partner: No    Emotionally Abused: No    Physically Abused: No    Sexually Abused: No    FAMILY HISTORY: Family History  Problem Relation Age of Onset   CAD Father    Asthma Father    Hypertension Father     Alzheimer's disease Father    Emphysema Father    COPD Father    Heart failure Mother    Peripheral vascular disease Mother    Stroke Mother    Hypertension Mother    Colonic polyp Mother    Cancer Brother        melanoma    ALLERGIES:  is allergic to moxifloxacin, quinolones, sulfa antibiotics, furosemide, aspirin, doxazosin, duloxetine, duloxetine hcl, hydrochlorothiazide, ibuprofen, nebivolol hcl, other, ranexa [ranolazine], robaxin [methocarbamol], topiramate, xarelto [rivaroxaban], zofran [ondansetron hcl], codeine, dimethyl sulfoxide, macrodantin [nitrofurantoin macrocrystal], nitrofurantoin, tape, and yellow dye.  MEDICATIONS:  Current Outpatient Medications  Medication Sig Dispense Refill   acetaminophen (TYLENOL) 500 MG tablet Take 500 mg by mouth 2 (two) times daily as needed (for pain).     aluminum hydroxide-magnesium carbonate (GAVISCON) 95-358 MG/15ML SUSP Take by mouth as needed for indigestion or heartburn.     apixaban (ELIQUIS) 5 MG TABS tablet TAKE 1 TABLET BY MOUTH TWICE A DAY 180 tablet 1   BELBUCA 150 MCG FILM Take by mouth 2 (two) times daily.     cyanocobalamin (VITAMIN B12) 1000 MCG tablet Take 1,000 mcg by mouth daily.     dextromethorphan-guaiFENesin (MUCINEX DM) 30-600 MG 12hr tablet Take 1 tablet by mouth every 12 (twelve) hours as needed for cough.     fexofenadine (ALLEGRA) 60 MG tablet Take 60 mg by mouth daily as needed for allergies.     furosemide (LASIX) 40 MG tablet Take 1 tablet (40 mg total) by mouth daily. 90 tablet 3   gabapentin (NEURONTIN) 300 MG capsule Take 300 mg by mouth at bedtime.     hydrALAZINE (APRESOLINE) 50 MG tablet TAKE 1 TABLET BY MOUTH THREE TIMES A DAY 270 tablet 1   HYDROcodone-acetaminophen (NORCO/VICODIN) 5-325 MG tablet Take 1 tablet by mouth daily at 6 (six) AM.     hydrocortisone (ANUSOL-HC) 25 MG suppository Place 25 mg rectally 2 (two) times daily as needed for hemorrhoids.     isosorbide dinitrate (ISORDIL) 10 MG tablet  TAKE 1 TABLET BY MOUTH THREE TIMES A DAY 270 tablet 1   losartan (COZAAR) 100 MG tablet Take 1 tablet (100 mg total) by mouth daily. 90 tablet 3   meclizine (ANTIVERT) 25 MG tablet Take 25 mg  by mouth 3 (three) times daily as needed for dizziness.     Methen-Hyosc-Meth Blue-Na Phos (UROGESIC-BLUE) 81.6 MG TABS Take 1 tablet by mouth every 6 (six) hours as needed (urinary burning).     metoprolol tartrate (LOPRESSOR) 25 MG tablet Take 1 tablet (25 mg total) by mouth 2 (two) times daily. 180 tablet 1   nitroGLYCERIN (NITROSTAT) 0.4 MG SL tablet PLACE 1 TABLET UNDER THE TONGE EVERY 5 MINUTES AS NEEDED FOR CHEST PAIN *TAKE UP TO 3 TABLETS* 25 tablet 4   nystatin cream (MYCOSTATIN) Apply topically 2 (two) times daily. 30 g 3   omeprazole (PRILOSEC) 20 MG capsule Take 20 mg by mouth 2 (two) times daily.     ondansetron (ZOFRAN-ODT) 4 MG disintegrating tablet Take 4 mg by mouth every 8 (eight) hours as needed for nausea (dissolve orally).  1   polyethylene glycol powder (GLYCOLAX/MIRALAX) 17 GM/SCOOP powder Take 17 g by mouth daily as needed for mild constipation.     rosuvastatin (CRESTOR) 5 MG tablet Take 1 tablet (5 mg total) by mouth 3 (three) times a week. 36 tablet 3   senna (SENOKOT) 8.6 MG tablet Take 1 tablet by mouth as needed for constipation.     spironolactone (ALDACTONE) 25 MG tablet TAKE 1 TABLET (25 MG TOTAL) BY MOUTH DAILY. 90 tablet 3   sucralfate (CARAFATE) 1 g tablet Take 1 tablet (1 g total) by mouth 4 (four) times daily -  with meals and at bedtime. (Patient taking differently: Take 1 g by mouth daily at 6 (six) AM. 1 tab in am, 1 tab in pm) 120 tablet 3   triamcinolone cream (KENALOG) 0.1 % Apply 1 application. topically 2 (two) times daily as needed (rash).     No current facility-administered medications for this visit.     PHYSICAL EXAMINATION: ECOG PERFORMANCE STATUS: 1 - Symptomatic but completely ambulatory  Vitals:   08/07/22 1423  BP: (!) 213/60  Pulse: 73  Resp: 16   Temp: 98.1 F (36.7 C)  SpO2: 97%   Filed Weights   08/07/22 1423  Weight: 204 lb 12.8 oz (92.9 kg)    GENERAL:alert, no distress and comfortable SKIN: skin color, texture, turgor are normal, no rashes or significant lesions EYES: normal, conjunctiva are pink and non-injected, sclera clear OROPHARYNX:no exudate, no erythema and lips, buccal mucosa, and tongue normal  NECK: supple, thyroid normal size, non-tender, without nodularity LYMPH:  no palpable lymphadenopathy in the cervical, axillary or inguinal LUNGS: clear to auscultation and percussion with normal breathing effort HEART: regular rate & rhythm and no murmurs and no lower extremity edema ABDOMEN:abdomen soft, non-tender and normal bowel sounds Musculoskeletal:no cyanosis of digits and no clubbing  PSYCH: alert & oriented x 3 with fluent speech NEURO: no focal motor/sensory deficits  LABORATORY DATA:  I have reviewed the data as listed Lab Results  Component Value Date   WBC 8.4 06/08/2022   HGB 8.7 (L) 06/08/2022   HCT 28.0 (L) 06/08/2022   MCV 77 (L) 06/08/2022   PLT 330 06/08/2022     Chemistry      Component Value Date/Time   NA 142 06/08/2022 1502   K 4.6 06/08/2022 1502   CL 103 06/08/2022 1502   CO2 23 06/08/2022 1502   BUN 17 06/08/2022 1502   CREATININE 1.05 (H) 06/08/2022 1502      Component Value Date/Time   CALCIUM 9.2 06/08/2022 1502   ALKPHOS 57 08/24/2021 2129   AST 22 08/24/2021 2129   ALT  19 08/24/2021 2129   BILITOT 0.8 08/24/2021 2129       RADIOGRAPHIC STUDIES: I have personally reviewed the radiological images as listed and agreed with the findings in the report. MR CARDIAC MORPHOLOGY W WO CONTRAST  Result Date: 07/26/2022 CLINICAL DATA:  64F with PYP scan suggestive of amyloid. Echo with EF 65-70%, moderate LVH, moderate AS EXAM: CARDIAC MRI TECHNIQUE: The patient was scanned on a 1.5 Tesla Siemens magnet. A dedicated cardiac coil was used. Functional imaging was done using  Fiesta sequences. 2,3, and 4 chamber views were done to assess for RWMA's. Modified Simpson's rule using a short axis stack was used to calculate an ejection fraction on a dedicated work Research officer, trade union. The patient received 12 cc of Gadavist. After 10 minutes inversion recovery sequences were used to assess for infiltration and scar tissue. Phase contrast velocity mapping was performed above the aortic and pulmonic valves CONTRAST:  12 cc  of Gadavist FINDINGS: Left ventricle: -Moderate asymmetric hypertrophy measuring up to 14mm in basal septum (8mm in posterior wall) -Normal size -Hyperdynamic systolic function -Mild ECV elevation (29%) -RV insertion site LGE LV EF:  73% (Normal 52-79%) Absolute volumes: LV EDV: (Normal 78-167 mL) LV ESV: 48mL (Normal 21-64 mL) LV SV: (Normal 52-114 mL) CO: 7.0L/min (Normal 2.7-6.3 L/min) Indexed volumes: LV EDV: 2mL/sq-m (Normal 50-96 mL/sq-m) LV ESV: 41mL/sq-m (Normal 10-40 mL/sq-m) LV SV: 18mL/sq-m (Normal 33-64 mL/sq-m) CI: 3.5L/min/sq-m (Normal 1.9-3.9 L/min/sq-m) Right ventricle: Normal size and systolic function RV EF: 57% (Normal 52-80%) Absolute volumes: RV EDV: (Normal 79-175 mL) RV ESV: 75mL (Normal 13-75 mL) RV SV: (Normal 56-110 mL) CO: 5.6L/min (Normal 2.7-6 L/min) Indexed volumes: RV EDV: 5mL/sq-m (Normal 51-97 mL/sq-m) RV ESV: 86mL/sq-m (Normal 9-42 mL/sq-m) RV SV: 52mL/sq-m (Normal 35-61 mL/sq-m) CI: 2.7L/min/sq-m (Normal 1.8-3.8 L/min/sq-m) Left atrium: Mild enlargement Right atrium: Normal size Mitral valve: Trivial regurgitation Aortic valve: No regurgitation Tricuspid valve: Trivial regurgitation Pulmonic valve: No regurgitation Aorta: Dilated ascending aorta measuring 40mm Pulmonary artery: Dilated main PA measuring 31mm Pericardium: Normal IMPRESSION: 1.  No evidence of cardiac amyloidosis 2. Moderate asymmetric LV hypertrophy measuring up to 14mm in basal septum (8mm in posterior wall), not meeting criteria for  hypertrophic cardiomyopathy (<10mm) 3.  Normal LV size with hyperdynamic systolic function (EF 73%) 4.  Normal RV size and systolic function (EF 57%) 5. RV insertion site LGE, which is a nonspecific scar pattern often seen in setting of elevated pulmonary pressures 6.  Dilated ascending aorta measuring 40mm 7.  Dilated main pulmonary artery measuring 31mm Electronically Signed   By: Epifanio Lesches M.D.   On: 07/26/2022 22:10   MR CARDIAC VELOCITY FLOW MAP  Result Date: 07/26/2022 CLINICAL DATA:  64F with PYP scan suggestive of amyloid. Echo with EF 65-70%, moderate LVH, moderate AS EXAM: CARDIAC MRI TECHNIQUE: The patient was scanned on a 1.5 Tesla Siemens magnet. A dedicated cardiac coil was used. Functional imaging was done using Fiesta sequences. 2,3, and 4 chamber views were done to assess for RWMA's. Modified Simpson's rule using a short axis stack was used to calculate an ejection fraction on a dedicated work Research officer, trade union. The patient received 12 cc of Gadavist. After 10 minutes inversion recovery sequences were used to assess for infiltration and scar tissue. Phase contrast velocity mapping was performed above the aortic and pulmonic valves CONTRAST:  12 cc  of Gadavist FINDINGS: Left ventricle: -Moderate asymmetric hypertrophy measuring up to 14mm in basal septum (8mm in posterior  wall) -Normal size -Hyperdynamic systolic function -Mild ECV elevation (29%) -RV insertion site LGE LV EF:  73% (Normal 52-79%) Absolute volumes: LV EDV: (Normal 78-167 mL) LV ESV: 48mL (Normal 21-64 mL) LV SV: (Normal 52-114 mL) CO: 7.0L/min (Normal 2.7-6.3 L/min) Indexed volumes: LV EDV: 71mL/sq-m (Normal 50-96 mL/sq-m) LV ESV: 15mL/sq-m (Normal 10-40 mL/sq-m) LV SV: 86mL/sq-m (Normal 33-64 mL/sq-m) CI: 3.5L/min/sq-m (Normal 1.9-3.9 L/min/sq-m) Right ventricle: Normal size and systolic function RV EF: 57% (Normal 52-80%) Absolute volumes: RV EDV: (Normal 79-175 mL) RV ESV: 75mL  (Normal 13-75 mL) RV SV: (Normal 56-110 mL) CO: 5.6L/min (Normal 2.7-6 L/min) Indexed volumes: RV EDV: 60mL/sq-m (Normal 51-97 mL/sq-m) RV ESV: 3mL/sq-m (Normal 9-42 mL/sq-m) RV SV: 26mL/sq-m (Normal 35-61 mL/sq-m) CI: 2.7L/min/sq-m (Normal 1.8-3.8 L/min/sq-m) Left atrium: Mild enlargement Right atrium: Normal size Mitral valve: Trivial regurgitation Aortic valve: No regurgitation Tricuspid valve: Trivial regurgitation Pulmonic valve: No regurgitation Aorta: Dilated ascending aorta measuring 40mm Pulmonary artery: Dilated main PA measuring 31mm Pericardium: Normal IMPRESSION: 1.  No evidence of cardiac amyloidosis 2. Moderate asymmetric LV hypertrophy measuring up to 14mm in basal septum (8mm in posterior wall), not meeting criteria for hypertrophic cardiomyopathy (<36mm) 3.  Normal LV size with hyperdynamic systolic function (EF 73%) 4.  Normal RV size and systolic function (EF 57%) 5. RV insertion site LGE, which is a nonspecific scar pattern often seen in setting of elevated pulmonary pressures 6.  Dilated ascending aorta measuring 40mm 7.  Dilated main pulmonary artery measuring 31mm Electronically Signed   By: Epifanio Lesches M.D.   On: 07/26/2022 22:10   MR CARDIAC VELOCITY FLOW MAP  Result Date: 07/26/2022 CLINICAL DATA:  4F with PYP scan suggestive of amyloid. Echo with EF 65-70%, moderate LVH, moderate AS EXAM: CARDIAC MRI TECHNIQUE: The patient was scanned on a 1.5 Tesla Siemens magnet. A dedicated cardiac coil was used. Functional imaging was done using Fiesta sequences. 2,3, and 4 chamber views were done to assess for RWMA's. Modified Simpson's rule using a short axis stack was used to calculate an ejection fraction on a dedicated work Research officer, trade union. The patient received 12 cc of Gadavist. After 10 minutes inversion recovery sequences were used to assess for infiltration and scar tissue. Phase contrast velocity mapping was performed above the aortic and pulmonic valves  CONTRAST:  12 cc  of Gadavist FINDINGS: Left ventricle: -Moderate asymmetric hypertrophy measuring up to 14mm in basal septum (8mm in posterior wall) -Normal size -Hyperdynamic systolic function -Mild ECV elevation (29%) -RV insertion site LGE LV EF:  73% (Normal 52-79%) Absolute volumes: LV EDV: (Normal 78-167 mL) LV ESV: 48mL (Normal 21-64 mL) LV SV: (Normal 52-114 mL) CO: 7.0L/min (Normal 2.7-6.3 L/min) Indexed volumes: LV EDV: 18mL/sq-m (Normal 50-96 mL/sq-m) LV ESV: 16mL/sq-m (Normal 10-40 mL/sq-m) LV SV: 48mL/sq-m (Normal 33-64 mL/sq-m) CI: 3.5L/min/sq-m (Normal 1.9-3.9 L/min/sq-m) Right ventricle: Normal size and systolic function RV EF: 57% (Normal 52-80%) Absolute volumes: RV EDV: (Normal 79-175 mL) RV ESV: 75mL (Normal 13-75 mL) RV SV: (Normal 56-110 mL) CO: 5.6L/min (Normal 2.7-6 L/min) Indexed volumes: RV EDV: 93mL/sq-m (Normal 51-97 mL/sq-m) RV ESV: 62mL/sq-m (Normal 9-42 mL/sq-m) RV SV: 16mL/sq-m (Normal 35-61 mL/sq-m) CI: 2.7L/min/sq-m (Normal 1.8-3.8 L/min/sq-m) Left atrium: Mild enlargement Right atrium: Normal size Mitral valve: Trivial regurgitation Aortic valve: No regurgitation Tricuspid valve: Trivial regurgitation Pulmonic valve: No regurgitation Aorta: Dilated ascending aorta measuring 40mm Pulmonary artery: Dilated main PA measuring 31mm Pericardium: Normal IMPRESSION: 1.  No evidence of cardiac amyloidosis 2.  Moderate asymmetric LV hypertrophy measuring up to 14mm in basal septum (8mm in posterior wall), not meeting criteria for hypertrophic cardiomyopathy (<63mm) 3.  Normal LV size with hyperdynamic systolic function (EF 73%) 4.  Normal RV size and systolic function (EF 57%) 5. RV insertion site LGE, which is a nonspecific scar pattern often seen in setting of elevated pulmonary pressures 6.  Dilated ascending aorta measuring 40mm 7.  Dilated main pulmonary artery measuring 31mm Electronically Signed   By: Epifanio Lesches M.D.   On: 07/26/2022 22:10    All  questions were answered. The patient knows to call the clinic with any problems, questions or concerns. I spent 45 minutes in the care of this patient including H and P, review of records, counseling and coordination of care.     Rachel Moulds, MD 08/07/2022 2:26 PM

## 2022-08-08 ENCOUNTER — Telehealth: Payer: Self-pay

## 2022-08-08 ENCOUNTER — Telehealth: Payer: Self-pay | Admitting: Hematology and Oncology

## 2022-08-08 LAB — KAPPA/LAMBDA LIGHT CHAINS
Kappa free light chain: 24.4 mg/L — ABNORMAL HIGH (ref 3.3–19.4)
Kappa, lambda light chain ratio: 1.64 (ref 0.26–1.65)
Lambda free light chains: 14.9 mg/L (ref 5.7–26.3)

## 2022-08-08 LAB — FERRITIN: Ferritin: 21 ng/mL (ref 11–307)

## 2022-08-08 NOTE — Telephone Encounter (Signed)
Spoke with patient confirming upcoming appointment  

## 2022-08-08 NOTE — Telephone Encounter (Signed)
Pt called to obtain lab results from visit with MD. Not all results are back, but pt was advised her hgb has improved and her iron is not low. She would like [place] to fax all of her lab results to her PCP when they all come back. She is concerned about her endoscopy/colonoscopy and whether or not she is bleeding internally. She would like a call back regarding this.   [facility] Physician fax: [phone]

## 2022-08-09 DIAGNOSIS — M7989 Other specified soft tissue disorders: Secondary | ICD-10-CM | POA: Diagnosis not present

## 2022-08-09 DIAGNOSIS — D649 Anemia, unspecified: Secondary | ICD-10-CM | POA: Diagnosis not present

## 2022-08-09 DIAGNOSIS — Z6836 Body mass index (BMI) 36.0-36.9, adult: Secondary | ICD-10-CM | POA: Diagnosis not present

## 2022-08-09 DIAGNOSIS — Z7901 Long term (current) use of anticoagulants: Secondary | ICD-10-CM | POA: Diagnosis not present

## 2022-08-09 DIAGNOSIS — R399 Unspecified symptoms and signs involving the genitourinary system: Secondary | ICD-10-CM | POA: Diagnosis not present

## 2022-08-09 DIAGNOSIS — D509 Iron deficiency anemia, unspecified: Secondary | ICD-10-CM | POA: Diagnosis not present

## 2022-08-09 LAB — BASIC METABOLIC PANEL: EGFR: 65

## 2022-08-09 LAB — IGG, IGA, IGM
IgA: 197 mg/dL (ref 64–422)
IgG (Immunoglobin G), Serum: 826 mg/dL (ref 586–1602)
IgM (Immunoglobulin M), Srm: 71 mg/dL (ref 26–217)

## 2022-08-10 ENCOUNTER — Telehealth: Payer: Self-pay | Admitting: *Deleted

## 2022-08-10 ENCOUNTER — Ambulatory Visit: Payer: Medicare Other | Admitting: Psychiatry

## 2022-08-10 LAB — PROTEIN ELECTROPHORESIS, SERUM, WITH REFLEX
A/G Ratio: 1.3 (ref 0.7–1.7)
Albumin ELP: 3.6 g/dL (ref 2.9–4.4)
Alpha-1-Globulin: 0.2 g/dL (ref 0.0–0.4)
Alpha-2-Globulin: 0.8 g/dL (ref 0.4–1.0)
Beta Globulin: 1 g/dL (ref 0.7–1.3)
Gamma Globulin: 0.7 g/dL (ref 0.4–1.8)
Globulin, Total: 2.8 g/dL (ref 2.2–3.9)
Total Protein ELP: 6.4 g/dL (ref 6.0–8.5)

## 2022-08-10 NOTE — Telephone Encounter (Signed)
Pt calling back again for guidance on when to start and restart eliquis. She states the rquesting office had to change her surgery date to 08/29/22 due to pt's transportation. Please advise.

## 2022-08-10 NOTE — Telephone Encounter (Addendum)
-----   Message from Rachel Moulds, MD sent at 08/09/2022  8:28 AM EDT ----- Well, please let her know that her hemoglobin is better, ferritin is now in the normal range so her oral iron replacement is working.  She can continue oral iron twice a day and return to clinic in approximately 8 weeks.  Please adjust appointments accordingly.  Then can you also fax the kappa light chain as well as monoclonal protein results to her cardiologist.  They have requested [place] to draw these labs.  Thank  Pt contacted- reviewed above- and scheduled appts.  No further needs at this time.  Labs forwarded to requested MDs

## 2022-08-10 NOTE — Telephone Encounter (Signed)
Pt left message today at 315 approximately requesting a return call due to "some new complications"- " hope you can call me back today if not then please do on Monday."  This RN attempted to call pt and obtained her VM. Message left call was being returned by this RN.

## 2022-08-10 NOTE — Telephone Encounter (Signed)
I s/w the pt and advised as to when to hold Eliquis for new procedure date of 08/29/22. Pt has been advised to restart Eliquisw tonight's evening dose and then tomorrow she will be back on BID. Pt was advised she will continue with Eliquis now thru and including 08/26/27. Pt will begin holding as of 5/27-5/29 and resume after the surgeon feels it is safe. Pt thanked me for the call back and the help. I will update all parties involved.

## 2022-08-10 NOTE — Telephone Encounter (Signed)
   Patient Name: Jennifer [name]  DOB: 04/12/1945 MRN: [id number]  Primary Cardiologist: Christell Constant, MD  Chart reviewed as part of pre-operative protocol coverage. Pre-op clearance already addressed by colleagues in earlier phone notes. To summarize recommendations:  -Per office protocol, patient can hold Eliquis for 2 days prior to procedure.  She can restart when they tell her its safe to do so.  Will route this bundled recommendation to requesting provider via Epic fax function and remove from pre-op pool. Please call with questions.  Sharlene Dory, PA-C 08/10/2022, 4:28 PM

## 2022-08-13 ENCOUNTER — Telehealth: Payer: Self-pay

## 2022-08-13 NOTE — Telephone Encounter (Signed)
Left a message to call back.  Need to inform pt that [facility] will be sending her a buccal swab to check for ATTR.  Will be coming in the mail.

## 2022-08-15 ENCOUNTER — Telehealth: Payer: Self-pay | Admitting: Cardiology

## 2022-08-15 DIAGNOSIS — I4892 Unspecified atrial flutter: Secondary | ICD-10-CM

## 2022-08-15 NOTE — Telephone Encounter (Signed)
Left message for patient to call back  

## 2022-08-15 NOTE — Telephone Encounter (Signed)
Spoke with the patient who states that she has had a lot going on with her health recently. She has not had a chance to wear the heart monitor that Dr. Lalla Brothers ordered for her last year. She has one currently but it is expired. She is going to contact [employer] to see if they can send her a new one. Patient was scheduled to see Antionette Fairy, NP on 5/29 however the patient is now scheduled for a colonoscopy that day. She would like to reschedule her appointment for a later date and prefers to see Dr. Lalla Brothers.

## 2022-08-15 NOTE — Telephone Encounter (Signed)
Pt is requesting a callback today from the [facility] nurse to go over a few things with her regarding her monitor, upcoming appt, and recent doc visits and would like some help with her questions and concerns. Please advise.

## 2022-08-15 NOTE — Telephone Encounter (Signed)
Follow Up:     Jennifer [name] is returning Jennifer [name]'s call from today.

## 2022-08-17 ENCOUNTER — Ambulatory Visit: Payer: Medicare Other | Attending: Cardiology

## 2022-08-17 DIAGNOSIS — I4892 Unspecified atrial flutter: Secondary | ICD-10-CM

## 2022-08-17 NOTE — Telephone Encounter (Signed)
Patient is following up. She states she spoke with [employer] and she needs to return the last monitor. she plans to send it back Monday since it expired on 5/11 and [employer] needs a new order.

## 2022-08-17 NOTE — Progress Notes (Unsigned)
Enrolled patient for a 14 day Zio XT  monitor to be mailed to patients home  °

## 2022-08-17 NOTE — Addendum Note (Signed)
Addended by: Frutoso Schatz on: 08/17/2022 03:25 PM   Modules accepted: Orders

## 2022-08-17 NOTE — Telephone Encounter (Signed)
Orders placed.

## 2022-08-20 ENCOUNTER — Other Ambulatory Visit: Payer: Self-pay | Admitting: Internal Medicine

## 2022-08-20 NOTE — Telephone Encounter (Signed)
Left a message to call back.

## 2022-08-20 NOTE — Telephone Encounter (Signed)
Patient is returning and requesting call back from [place], [place] either today or Wednesday 05/22.

## 2022-08-22 ENCOUNTER — Ambulatory Visit: Payer: Medicare Other | Admitting: Cardiology

## 2022-08-22 NOTE — Telephone Encounter (Signed)
Left message for patient informing her of Shamea's message: Rexene Alberts will be sending her a buccal swab to check for ATTR. Will be coming in the mail.   Provided office number for callback if any questions.

## 2022-08-22 NOTE — Telephone Encounter (Signed)
Follow Up:    Patient said she was returning Shamea's call from this week.

## 2022-08-24 DIAGNOSIS — R399 Unspecified symptoms and signs involving the genitourinary system: Secondary | ICD-10-CM | POA: Diagnosis not present

## 2022-08-29 ENCOUNTER — Ambulatory Visit: Payer: Medicare Other | Admitting: Cardiology

## 2022-08-29 DIAGNOSIS — D123 Benign neoplasm of transverse colon: Secondary | ICD-10-CM | POA: Diagnosis not present

## 2022-08-29 DIAGNOSIS — K5730 Diverticulosis of large intestine without perforation or abscess without bleeding: Secondary | ICD-10-CM | POA: Diagnosis not present

## 2022-08-29 DIAGNOSIS — K648 Other hemorrhoids: Secondary | ICD-10-CM | POA: Diagnosis not present

## 2022-08-29 DIAGNOSIS — K449 Diaphragmatic hernia without obstruction or gangrene: Secondary | ICD-10-CM | POA: Diagnosis not present

## 2022-08-29 DIAGNOSIS — K644 Residual hemorrhoidal skin tags: Secondary | ICD-10-CM | POA: Diagnosis not present

## 2022-08-29 DIAGNOSIS — K3189 Other diseases of stomach and duodenum: Secondary | ICD-10-CM | POA: Diagnosis not present

## 2022-08-29 DIAGNOSIS — K317 Polyp of stomach and duodenum: Secondary | ICD-10-CM | POA: Diagnosis not present

## 2022-08-29 DIAGNOSIS — D509 Iron deficiency anemia, unspecified: Secondary | ICD-10-CM | POA: Diagnosis not present

## 2022-08-29 DIAGNOSIS — K2930 Chronic superficial gastritis without bleeding: Secondary | ICD-10-CM | POA: Diagnosis not present

## 2022-09-05 DIAGNOSIS — K2930 Chronic superficial gastritis without bleeding: Secondary | ICD-10-CM | POA: Diagnosis not present

## 2022-09-05 DIAGNOSIS — D123 Benign neoplasm of transverse colon: Secondary | ICD-10-CM | POA: Diagnosis not present

## 2022-09-05 DIAGNOSIS — K317 Polyp of stomach and duodenum: Secondary | ICD-10-CM | POA: Diagnosis not present

## 2022-09-12 ENCOUNTER — Ambulatory Visit: Payer: Medicare Other | Admitting: Neurology

## 2022-09-12 NOTE — Telephone Encounter (Signed)
Called pt advised why [facility] testing changed. Also reviewed with pt the reasoning for [facility] testing: to determine if has ATTR-CM.  If pt is positive there is medication that can treat ATTR-CM that may in turn help with chronic pain issues. Pt reports had Colonoscopy had 2 pylops that are being biopsied. Has terrible leg swelling and burning has an OV with PCP tomorrow 09/13/22 to address concern.  Hasn't been feeling well lately.  BP is good. Scheduled OV with Dr. [name] for 01/16/23 at 11:40 am.  Sent out appointment reminders for all Cardiology appointments per request. All questions answered.

## 2022-09-13 ENCOUNTER — Telehealth: Payer: Self-pay | Admitting: Cardiology

## 2022-09-13 ENCOUNTER — Other Ambulatory Visit: Payer: Self-pay | Admitting: Internal Medicine

## 2022-09-13 DIAGNOSIS — I4892 Unspecified atrial flutter: Secondary | ICD-10-CM

## 2022-09-13 NOTE — Telephone Encounter (Signed)
Left a message to call back.

## 2022-09-13 NOTE — Telephone Encounter (Signed)
Patient wants a call back directly from RN Shamea. 

## 2022-09-13 NOTE — Telephone Encounter (Signed)
Pt returning call

## 2022-09-13 NOTE — Telephone Encounter (Signed)
Called pt to f/u.  Pt reports was unable to make PCP OV today d/t swelling and pain in legs.  Wants to know if can increase furosemide to help with swelling.  RN spoke with MD advised pt to increase furosemide to 80 mg PO QD for 3 days then go back to 40 mg QD.  If symptoms persist to call in for an OV for further direction. Advised pt of MD recommendations pt expresses understanding.  All questions answered.

## 2022-09-14 NOTE — Telephone Encounter (Signed)
Prescription refill request for Eliquis received. Indication:AFLUTTER Last office visit:5/24 Scr:0.92  5/24 Age: 77 Weight:92.9  kg  Prescription refilled

## 2022-09-18 ENCOUNTER — Ambulatory Visit: Payer: Medicare Other | Admitting: Psychiatry

## 2022-09-20 DIAGNOSIS — M533 Sacrococcygeal disorders, not elsewhere classified: Secondary | ICD-10-CM | POA: Diagnosis not present

## 2022-09-20 DIAGNOSIS — M5416 Radiculopathy, lumbar region: Secondary | ICD-10-CM | POA: Diagnosis not present

## 2022-09-20 DIAGNOSIS — M7061 Trochanteric bursitis, right hip: Secondary | ICD-10-CM | POA: Diagnosis not present

## 2022-09-20 DIAGNOSIS — R399 Unspecified symptoms and signs involving the genitourinary system: Secondary | ICD-10-CM | POA: Diagnosis not present

## 2022-09-20 DIAGNOSIS — Z79899 Other long term (current) drug therapy: Secondary | ICD-10-CM | POA: Diagnosis not present

## 2022-09-20 DIAGNOSIS — M48062 Spinal stenosis, lumbar region with neurogenic claudication: Secondary | ICD-10-CM | POA: Diagnosis not present

## 2022-09-20 DIAGNOSIS — G894 Chronic pain syndrome: Secondary | ICD-10-CM | POA: Diagnosis not present

## 2022-09-28 ENCOUNTER — Ambulatory Visit: Payer: Medicare Other | Admitting: Psychiatry

## 2022-10-02 ENCOUNTER — Inpatient Hospital Stay: Payer: Medicare Other | Admitting: Hematology and Oncology

## 2022-10-03 ENCOUNTER — Ambulatory Visit: Payer: Medicare Other | Admitting: Cardiology

## 2022-10-10 ENCOUNTER — Encounter: Payer: Medicare Other | Admitting: Infectious Diseases

## 2022-10-15 ENCOUNTER — Telehealth: Payer: Self-pay | Admitting: Hematology and Oncology

## 2022-10-16 ENCOUNTER — Inpatient Hospital Stay: Payer: Medicare Other | Admitting: Adult Health

## 2022-10-17 ENCOUNTER — Encounter: Payer: Medicare Other | Admitting: Infectious Diseases

## 2022-10-19 ENCOUNTER — Ambulatory Visit: Payer: Medicare Other | Admitting: Psychiatry

## 2022-10-23 ENCOUNTER — Encounter: Payer: Medicare Other | Admitting: Infectious Diseases

## 2022-10-26 ENCOUNTER — Ambulatory Visit: Payer: Medicare Other | Admitting: Psychiatry

## 2022-10-30 ENCOUNTER — Inpatient Hospital Stay: Payer: Medicare Other | Admitting: Adult Health

## 2022-10-30 ENCOUNTER — Telehealth: Payer: Self-pay | Admitting: Adult Health

## 2022-11-01 ENCOUNTER — Ambulatory Visit: Payer: Medicare Other | Admitting: Psychiatry

## 2022-11-06 ENCOUNTER — Ambulatory Visit: Payer: Medicare Other | Admitting: Neurology

## 2022-11-07 ENCOUNTER — Ambulatory Visit: Payer: Medicare Other | Admitting: Cardiology

## 2022-11-07 ENCOUNTER — Encounter: Payer: Medicare Other | Admitting: Infectious Diseases

## 2022-11-13 DIAGNOSIS — R338 Other retention of urine: Secondary | ICD-10-CM | POA: Diagnosis not present

## 2022-11-13 DIAGNOSIS — N3010 Interstitial cystitis (chronic) without hematuria: Secondary | ICD-10-CM | POA: Diagnosis not present

## 2022-11-20 ENCOUNTER — Inpatient Hospital Stay: Payer: Medicare Other | Admitting: Adult Health

## 2022-11-20 ENCOUNTER — Telehealth: Payer: Self-pay | Admitting: Hematology and Oncology

## 2022-11-20 NOTE — Telephone Encounter (Signed)
 Rescheduled appointment per patients request via incoming call. Patient is aware of the changes made to her upcoming appointment.

## 2022-11-28 ENCOUNTER — Encounter: Payer: Medicare Other | Admitting: Infectious Diseases

## 2022-11-28 ENCOUNTER — Ambulatory Visit: Payer: Medicare Other | Admitting: Cardiology

## 2022-11-29 ENCOUNTER — Telehealth: Payer: Self-pay | Admitting: Adult Health

## 2022-11-29 NOTE — Telephone Encounter (Signed)
Rescheduled appointments per patients request incoming incoming call. Patient is aware of the changes made to her upcoming appointments.

## 2022-12-04 ENCOUNTER — Encounter: Payer: Medicare Other | Admitting: Infectious Diseases

## 2022-12-05 ENCOUNTER — Encounter: Payer: Medicare Other | Admitting: Infectious Diseases

## 2022-12-11 ENCOUNTER — Telehealth: Payer: Self-pay | Admitting: Cardiology

## 2022-12-11 ENCOUNTER — Telehealth: Payer: Self-pay

## 2022-12-11 NOTE — Telephone Encounter (Signed)
Left message for patient to call back - need to know if she wore her heart monitor. Results are not in and she has a follow up scheduled for 9/25 with Dr. Lalla Brothers.

## 2022-12-11 NOTE — Telephone Encounter (Signed)
Patient is returning Jennifer [name]'s phone call and would like to have RN Jennifer [name] call her on 09/16 around 8AM - 9 AM. Patient states she will be busy the rest of the week and 09/16 would be a good day to call.

## 2022-12-13 ENCOUNTER — Ambulatory Visit: Payer: Medicare Other | Admitting: Adult Health

## 2022-12-13 ENCOUNTER — Other Ambulatory Visit: Payer: Medicare Other

## 2022-12-13 NOTE — Telephone Encounter (Signed)
Patient is returning call. She states that she needs to discuss a situation with Kinnie Feil, Charity fundraiser. She also states she hasn't returned monitor. States she can only be reached until 12 p.m. today.

## 2022-12-13 NOTE — Telephone Encounter (Signed)
Left message for patient to call back - advised that I will not be in the office next week. Advised for her to call back at her convenience to let us know whether she has returned her heart monitor.

## 2022-12-13 NOTE — Telephone Encounter (Signed)
Spoke with the patient who states that she has still not worn the heart monitor yet as she has been dealing with multiple other health issues. She plans to start the monitor at the beginning of November. I have rescheduled her appointment with Dr. Lalla Brothers so that we will have the results for her appointment.

## 2022-12-14 DIAGNOSIS — G894 Chronic pain syndrome: Secondary | ICD-10-CM | POA: Diagnosis not present

## 2022-12-14 DIAGNOSIS — M47816 Spondylosis without myelopathy or radiculopathy, lumbar region: Secondary | ICD-10-CM | POA: Diagnosis not present

## 2022-12-14 DIAGNOSIS — M48062 Spinal stenosis, lumbar region with neurogenic claudication: Secondary | ICD-10-CM | POA: Diagnosis not present

## 2022-12-15 ENCOUNTER — Other Ambulatory Visit: Payer: Self-pay | Admitting: Internal Medicine

## 2022-12-19 DIAGNOSIS — M7989 Other specified soft tissue disorders: Secondary | ICD-10-CM | POA: Diagnosis not present

## 2022-12-19 DIAGNOSIS — Z7901 Long term (current) use of anticoagulants: Secondary | ICD-10-CM | POA: Diagnosis not present

## 2022-12-20 ENCOUNTER — Ambulatory Visit: Payer: Medicare Other | Admitting: Psychiatry

## 2022-12-25 ENCOUNTER — Inpatient Hospital Stay (HOSPITAL_BASED_OUTPATIENT_CLINIC_OR_DEPARTMENT_OTHER): Payer: Medicare Other | Admitting: Adult Health

## 2022-12-25 ENCOUNTER — Inpatient Hospital Stay: Payer: Medicare Other

## 2022-12-25 ENCOUNTER — Encounter: Payer: Self-pay | Admitting: Adult Health

## 2022-12-25 ENCOUNTER — Inpatient Hospital Stay: Payer: Medicare Other | Attending: Adult Health

## 2022-12-25 VITALS — BP 180/52 | HR 64 | Temp 97.9°F | Resp 18 | Wt 208.3 lb

## 2022-12-25 DIAGNOSIS — E538 Deficiency of other specified B group vitamins: Secondary | ICD-10-CM

## 2022-12-25 DIAGNOSIS — D509 Iron deficiency anemia, unspecified: Secondary | ICD-10-CM | POA: Diagnosis not present

## 2022-12-25 LAB — CMP (CANCER CENTER ONLY)
ALT: 14 U/L (ref 0–44)
AST: 18 U/L (ref 15–41)
Albumin: 4.5 g/dL (ref 3.5–5.0)
Alkaline Phosphatase: 71 U/L (ref 38–126)
Anion gap: 7 (ref 5–15)
BUN: 20 mg/dL (ref 8–23)
CO2: 31 mmol/L (ref 22–32)
Calcium: 9.8 mg/dL (ref 8.9–10.3)
Chloride: 101 mmol/L (ref 98–111)
Creatinine: 1.04 mg/dL — ABNORMAL HIGH (ref 0.44–1.00)
GFR, Estimated: 56 mL/min — ABNORMAL LOW (ref >60)
Glucose, Bld: 92 mg/dL (ref 70–99)
Potassium: 4.2 mmol/L (ref 3.5–5.1)
Sodium: 139 mmol/L (ref 135–145)
Total Bilirubin: 0.6 mg/dL (ref 0.3–1.2)
Total Protein: 7.5 g/dL (ref 6.5–8.1)

## 2022-12-25 LAB — CBC WITH DIFFERENTIAL/PLATELET
Abs Immature Granulocytes: 0.02 10*3/uL (ref 0.00–0.07)
Basophils Absolute: 0 10*3/uL (ref 0.0–0.1)
Basophils Relative: 1 %
Eosinophils Absolute: 0.2 10*3/uL (ref 0.0–0.5)
Eosinophils Relative: 2 %
HCT: 34.7 % — ABNORMAL LOW (ref 36.0–46.0)
Hemoglobin: 11.1 g/dL — ABNORMAL LOW (ref 12.0–15.0)
Immature Granulocytes: 0 %
Lymphocytes Relative: 24 %
Lymphs Abs: 1.9 10*3/uL (ref 0.7–4.0)
MCH: 28.6 pg (ref 26.0–34.0)
MCHC: 32 g/dL (ref 30.0–36.0)
MCV: 89.4 fL (ref 80.0–100.0)
Monocytes Absolute: 0.7 10*3/uL (ref 0.1–1.0)
Monocytes Relative: 9 %
Neutro Abs: 4.9 10*3/uL (ref 1.7–7.7)
Neutrophils Relative %: 64 %
Platelets: 253 10*3/uL (ref 150–400)
RBC: 3.88 MIL/uL (ref 3.87–5.11)
RDW: 13.2 % (ref 11.5–15.5)
WBC: 7.8 10*3/uL (ref 4.0–10.5)
nRBC: 0 % (ref 0.0–0.2)

## 2022-12-25 LAB — VITAMIN B12: Vitamin B-12: 962 pg/mL — ABNORMAL HIGH (ref 180–914)

## 2022-12-25 NOTE — Progress Notes (Signed)
[facility] Cancer Follow up:    Jennifer Soho, PA-C [address] [place] [address]   DIAGNOSIS: Iron deficiency anemia; b12 deficiency  SUMMARY OF HEMATOLOGIC HISTORY: Referred on July 04, 2022 from [place] primary care.  PCP Labs 06/26/2022: Hemglobin 8.6, MCV 74.1, Iron 18, saturation 4%, TIBC 502, B12 179. Started iron BID and b12 supplementation in mid-April. FOBT positive. On Eliquis.  S/p hysterectomy in 1980. Referred to GI.   PCP Labs 07/26/2022: Hemoglobin 8.4, MCV 74.7. Evaluated by Dr. Al Pimple on 08/07/2022: Ferritin 21, SPEP negative, Iron 52, TIBC 456, saturation 11%, hemoglobin 9.8, MCV 81.3, creatinine normal.  Upper endoscopy and colonoscopy on 08/29/2022: multiple gastric polyps, biopsied and negative, changed from Omeprazole to Protonix, taking Carafate.   CURRENT THERAPY: oral iron BID, oral b12 daily  INTERVAL HISTORY: Jennifer [name] 77 y.o. female returns for follow-up of her iron deficiency anemia. She has continued on oral iron BID.  It causes nausea and she takes zofran prior to the oral iron.  She is also taking b12 daily.  Other than the nausea with the oral iron she is tolerating these well.  She ran out of her medication and due to transportation issues she was unable to take oral iron for a few weeks. (Her friend who helps her went out of town for a few weeks). She has since been able to get some more.   She denies any blood in her stool or black tarry stool.  She is still fatigued and is unsure the cause.   Jennifer [name] brought records from when she underwent upper endoscopy and colonoscopy on 08/29/2022 with Dr. Marca Ancona that demonstrated two 3-7 mm polyps in transverse colon and multiple gastric polyps that were biopsied and negative.    She was changed from omeprazole to Protonix bid.  She is taking Eliquis.  She is taking carafate.  She doesn't have a vehicle.     Patient Active Problem List   Diagnosis Date Noted   Iron deficiency anemia  12/28/2022   B12 deficiency 12/28/2022   DOE (dyspnea on exertion) 01/31/2022   Hiatal hernia 08/28/2021   Gastric polyps 08/27/2021   Chronic pain syndrome 08/25/2021   Atrial flutter (HCC) 05/11/2021   Aortic valve disorder 02/13/2021   CAD (coronary artery disease) 02/13/2021   Chronic buttock pain 11/25/2020   Unstable angina (HCC)    Ischemic chest pain (HCC) 09/15/2020   Interstitial cystitis (chronic) without hematuria 09/15/2020   Chronic low back pain 09/15/2020   Chronic diastolic CHF (congestive heart failure) (HCC) 09/15/2020   Heart murmur 09/08/2020   Allergic rhinitis 08/17/2020   Altered bowel function 08/17/2020   Anxiety disorder 08/17/2020   Body mass index (BMI) 35.0-35.9, adult 08/17/2020   Chronic fatigue syndrome 08/17/2020   Colon cancer screening 08/17/2020   Functional diarrhea 08/17/2020   Hypothyroidism 08/17/2020   Intestinal gas excretion 08/17/2020   Localized obesity 08/17/2020   Nausea 08/17/2020   Polypharmacy 08/17/2020   Posttraumatic headache 08/17/2020   Posttraumatic vertigo 08/17/2020   Pure hypercholesterolemia 08/17/2020   Lower abdominal pain 08/17/2020   Scoliosis 08/17/2020   Sleep apnea 08/17/2020   Unspecified abnormal finding in specimens from other organs, systems and tissues 08/17/2020   Vitamin D deficiency 08/17/2020   Back pain 04/28/2020   Candidiasis 10/15/2019   Encounter for orthopedic follow-up care 08/12/2018   Vertigo 02/26/2018   Degenerative arthritis of right knee 02/19/2018   Urinary tract infection, site not specified 07/04/2017   Arthralgia  of both knees 01/23/2017   Facet arthritis, degenerative, lumbar spine 01/23/2017   Benign paroxysmal positional vertigo 07/03/2016   IBS (irritable bowel syndrome) 04/19/2015   GERD (gastroesophageal reflux disease) 03/21/2015   Fibromyalgia 03/21/2015   Recurrent bacterial cystitis 03/21/2015   HTN (hypertension) 03/21/2015   Fibrositis 03/21/2015   SI  (sacroiliac) pain 12/16/2014   DDD (degenerative disc disease), lumbar 05/11/2014   Facet arthropathy 05/11/2014   Spinal stenosis of lumbar region 05/11/2014    is allergic to moxifloxacin, quinolones, sulfa antibiotics, furosemide, aspirin, doxazosin, duloxetine, duloxetine hcl, hydrochlorothiazide, ibuprofen, nebivolol hcl, other, ranexa [ranolazine], robaxin [methocarbamol], topiramate, xarelto [rivaroxaban], zofran [ondansetron hcl], codeine, dimethyl sulfoxide, macrodantin [nitrofurantoin macrocrystal], nitrofurantoin, tape, and yellow dye.  MEDICAL HISTORY: Past Medical History:  Diagnosis Date   Acute meniscal tear of left knee    FOLLOWED BY DR GIAFFREY   Anemia    Aortic stenosis    Ascending aorta dilation (HCC)    Atrial flutter (HCC)    AV block, Mobitz 1    BPPV (benign paroxysmal positional vertigo)    CAD (coronary artery disease)    Chronic bladder pain    Chronic fatigue syndrome    Chronic low back pain    W/ RIGHT LEG PAIN AND NUMBNESS   Cystitis, chronic    Fibromyalgia    GERD (gastroesophageal reflux disease)    Hiatal hernia    Hypertension    IC (interstitial cystitis)    LBBB (left bundle branch block)    transient hx   OSA (obstructive sleep apnea)    per pt study yrs ago-- moderate osa ,  intolerant cpap   Pinched vertebral nerve    bilateral L2 -- L3 and L3 -- L4-/  epidural injection's treatment , PT and pain clince   PONV (postoperative nausea and vomiting)    severe   PVC's (premature ventricular contractions)    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    Self-catheterizes urinary bladder    Spinal stenosis, lumbar region with neurogenic claudication    Wears glasses    Wears partial dentures    upper and lower    SURGICAL HISTORY: Past Surgical History:  Procedure Laterality Date   ABDOMINAL AORTOGRAM N/A 09/16/2020   Procedure: ABDOMINAL AORTOGRAM;  Surgeon: Iran Ouch, MD;  Location: [facility] INVASIVE  CV LAB;  Service: Cardiovascular;  Laterality: N/A;   ABDOMINAL HYSTERECTOMY  1980   w/ Left Salpingoophorectomy   BIOPSY  08/27/2021   Procedure: BIOPSY;  Surgeon: Vida Rigger, MD;  Location: [facility] ENDOSCOPY;  Service: Gastroenterology;;   CARDIAC CATHETERIZATION  08-21-2001  dr Verdis Prime   normal coronary arteries and LVF   CARPAL TUNNEL RELEASE Bilateral 1986   CECALCYSTOPLASTY/  APPENDECTOMY  1984   at [facility]   CHOLECYSTECTOMY  1992   CYSTO WITH HYDRODISTENSION N/A 01/12/2016   Procedure: CYSTOSCOPY/HYDRODISTENSION;  Surgeon: Jethro Bolus, MD;  Location: [facility];  Service: Urology;  Laterality: N/A;   CYSTO WITH HYDRODISTENSION N/A 08/30/2016   Procedure: CYSTOSCOPY/HYDRODISTENSION OF BLADDER INJECTION OF MARCAINE/PYRIDIUM;  Surgeon: Jethro Bolus, MD;  Location: [facility];  Service: Urology;  Laterality: N/A;   ESOPHAGOGASTRODUODENOSCOPY (EGD) WITH PROPOFOL N/A 08/27/2021   Procedure: ESOPHAGOGASTRODUODENOSCOPY (EGD) WITH PROPOFOL;  Surgeon: Vida Rigger, MD;  Location: [facility] ENDOSCOPY;  Service: Gastroenterology;  Laterality: N/A;   LEFT HEART CATH AND CORONARY ANGIOGRAPHY N/A 09/16/2020   Procedure: LEFT HEART CATH AND CORONARY ANGIOGRAPHY;  Surgeon: Kirke Corin,  Chelsea Aus, MD;  Location: [facility] INVASIVE CV LAB;  Service: Cardiovascular;  Laterality: N/A;   MULTIPLE CYSTO/  HYDRODISTENTION/  INSTILLATION THERAPY  last one 08-19-2009   NASAL SINUS SURGERY  1987   TONSILLECTOMY  as child   VAGINAL GROWTH REMOVED  as teen    SOCIAL HISTORY: Social History   Socioeconomic History   Marital status: Single    Spouse name: none   Number of children: 0   Years of education: College   Highest education level: Not on file  Occupational History   Occupation: n/a  Tobacco Use   Smoking status: Never   Smokeless tobacco: Never  Vaping Use   Vaping status: Never Used  Substance and Sexual Activity   Alcohol use: No    Alcohol/week: 0.0 standard  drinks of alcohol   Drug use: No   Sexual activity: Not on file  Other Topics Concern   Not on file  Social History Narrative   Lives alone   Sandrea Matte, RN    Caffeine use: 2 glasses tea/day   Brother   Mother passed in ?67 at 78, father passed in 4's in 2006   She was a Runner, broadcasting/film/video      Social Determinants of Corporate investment banker Strain: Low Risk  (03/22/2022)   Overall Financial Resource Strain (CARDIA)    Difficulty of Paying Living Expenses: Not very hard  Food Insecurity: No Food Insecurity (07/19/2022)   Hunger Vital Sign    Worried About Running Out of Food in the Last Year: Never true    Ran Out of Food in the Last Year: Never true  Transportation Needs: No Transportation Needs (07/19/2022)   PRAPARE - Administrator, Civil Service (Medical): No    Lack of Transportation (Non-Medical): No  Physical Activity: Not on file  Stress: No Stress Concern Present (03/22/2022)   [employer] of Occupational Health - Occupational Stress Questionnaire    Feeling of Stress : Only a little  Social Connections: Moderately Integrated (05/15/2021)   Social Connection and Isolation Panel [NHANES]    Frequency of Communication with Friends and Family: Twice a week    Frequency of Social Gatherings with Friends and Family: Twice a week    Attends Religious Services: 1 to 4 times per year    Active Member of [employer] or Organizations: Yes    Attends Banker Meetings: 1 to 4 times per year    Marital Status: Never married  Intimate Partner Violence: Not At Risk (05/15/2021)   Humiliation, Afraid, Rape, and Kick questionnaire    Fear of Current or Ex-Partner: No    Emotionally Abused: No    Physically Abused: No    Sexually Abused: No    FAMILY HISTORY: Family History  Problem Relation Age of Onset   CAD Father    Asthma Father    Hypertension Father    Alzheimer's disease Father    Emphysema Father    COPD Father    Heart failure Mother     Peripheral vascular disease Mother    Stroke Mother    Hypertension Mother    Colonic polyp Mother    Cancer Brother        melanoma    Review of Systems  Constitutional:  Positive for fatigue. Negative for appetite change, chills, fever and unexpected weight change.  HENT:   Negative for hearing loss, lump/mass and trouble swallowing.   Eyes:  Negative for eye problems and  icterus.  Respiratory:  Negative for chest tightness, cough and shortness of breath.   Cardiovascular:  Negative for chest pain, leg swelling and palpitations.  Gastrointestinal:  Negative for abdominal distention, abdominal pain, constipation, diarrhea, nausea and vomiting.  Endocrine: Negative for hot flashes.  Genitourinary:  Negative for difficulty urinating.   Musculoskeletal:  Negative for arthralgias.  Skin:  Negative for itching and rash.  Neurological:  Negative for dizziness, extremity weakness, headaches and numbness.  Hematological:  Negative for adenopathy. Does not bruise/bleed easily.  Psychiatric/Behavioral:  Negative for depression. The patient is not nervous/anxious.       PHYSICAL EXAMINATION    Vitals:   12/25/22 1459  BP: (!) 180/52  Pulse: 64  Resp: 18  Temp: 97.9 F (36.6 C)  SpO2: 98%    Physical Exam Constitutional:      General: She is not in acute distress.    Appearance: Normal appearance. She is not toxic-appearing.  HENT:     Head: Normocephalic and atraumatic.     Mouth/Throat:     Mouth: Mucous membranes are moist.     Pharynx: Oropharynx is clear. No oropharyngeal exudate or posterior oropharyngeal erythema.  Eyes:     General: No scleral icterus. Cardiovascular:     Rate and Rhythm: Normal rate and regular rhythm.     Pulses: Normal pulses.     Heart sounds: Normal heart sounds.  Pulmonary:     Effort: Pulmonary effort is normal.     Breath sounds: Normal breath sounds.  Abdominal:     General: Abdomen is flat. Bowel sounds are normal. There is no  distension.     Palpations: Abdomen is soft.     Tenderness: There is no abdominal tenderness.  Musculoskeletal:        General: No swelling.     Cervical back: Neck supple.  Lymphadenopathy:     Cervical: No cervical adenopathy.  Skin:    General: Skin is warm and dry.     Findings: No rash.  Neurological:     General: No focal deficit present.     Mental Status: She is alert.  Psychiatric:        Mood and Affect: Mood normal.        Behavior: Behavior normal.     LABORATORY DATA:  CBC    Component Value Date/Time   WBC 7.8 12/25/2022 1417   RBC 3.88 12/25/2022 1417   HGB 11.1 (L) 12/25/2022 1417   HGB 8.7 (L) 06/08/2022 1502   HCT 34.7 (L) 12/25/2022 1417   HCT 28.0 (L) 06/08/2022 1502   PLT 253 12/25/2022 1417   PLT 330 06/08/2022 1502   MCV 89.4 12/25/2022 1417   MCV 77 (L) 06/08/2022 1502   MCH 28.6 12/25/2022 1417   MCHC 32.0 12/25/2022 1417   RDW 13.2 12/25/2022 1417   RDW 17.1 (H) 06/08/2022 1502   LYMPHSABS 1.9 12/25/2022 1417   MONOABS 0.7 12/25/2022 1417   EOSABS 0.2 12/25/2022 1417   BASOSABS 0.0 12/25/2022 1417    CMP     Component Value Date/Time   NA 139 12/25/2022 1601   NA 142 06/08/2022 1502   K 4.2 12/25/2022 1601   CL 101 12/25/2022 1601   CO2 31 12/25/2022 1601   GLUCOSE 92 12/25/2022 1601   BUN 20 12/25/2022 1601   BUN 17 06/08/2022 1502   CREATININE 1.04 (H) 12/25/2022 1601   CALCIUM 9.8 12/25/2022 1601   PROT 7.5 12/25/2022 1601   ALBUMIN 4.5 12/25/2022  1601   AST 18 12/25/2022 1601   ALT 14 12/25/2022 1601   ALKPHOS 71 12/25/2022 1601   BILITOT 0.6 12/25/2022 1601   GFRNONAA 56 (L) 12/25/2022 1601   GFRAA 57 (L) 02/15/2017 1523      ASSESSMENT and THERAPY PLAN:   B12 deficiency Will recheck b12 level today.  I recommended she continue supplementation, if not improved will check anti-parietal antibodies and intrinsic factor antibodies.   Iron deficiency anemia Jennifer [name]'s hemoglobin today is 11.1.  Her MCV continues to  improve.  Will recheck b12 and iron studies today.  If continued improvement, considering her nausea with the iron, we will reduce her to iron nightly to help avoid nausea.  I let her know that we would call her with these results.      All questions were answered. The patient knows to call the clinic with any problems, questions or concerns. We can certainly see the patient much sooner if necessary.  Total encounter time:30 minutes*in face-to-face visit time, chart review, lab review, care coordination, order entry, and documentation of the encounter time.    Lillard Anes, NP 12/28/22 11:49 AM Medical Oncology and Hematology [facility] [address] [place], [place] [address] Tel. [phone]    Fax. [phone]  *Total Encounter Time as defined by the Centers for Medicare and Medicaid Services includes, in addition to the face-to-face time of a patient visit (documented in the note above) non-face-to-face time: obtaining and reviewing outside history, ordering and reviewing medications, tests or procedures, care coordination (communications with other health care professionals or caregivers) and documentation in the medical record.

## 2022-12-26 ENCOUNTER — Ambulatory Visit: Payer: Medicare Other | Admitting: Cardiology

## 2022-12-26 LAB — IRON AND IRON BINDING CAPACITY (CC-WL,HP ONLY)
Iron: 59 ug/dL (ref 28–170)
Saturation Ratios: 13 % (ref 10.4–31.8)
TIBC: 440 ug/dL (ref 250–450)
UIBC: 381 ug/dL (ref 148–442)

## 2022-12-26 LAB — FERRITIN: Ferritin: 25 ng/mL (ref 11–307)

## 2022-12-27 ENCOUNTER — Encounter: Payer: Self-pay | Admitting: Gastroenterology

## 2022-12-28 ENCOUNTER — Ambulatory Visit: Payer: Medicare Other | Admitting: Psychiatry

## 2022-12-28 ENCOUNTER — Telehealth: Payer: Self-pay

## 2022-12-28 DIAGNOSIS — E538 Deficiency of other specified B group vitamins: Secondary | ICD-10-CM | POA: Insufficient documentation

## 2022-12-28 DIAGNOSIS — D509 Iron deficiency anemia, unspecified: Secondary | ICD-10-CM | POA: Insufficient documentation

## 2022-12-28 NOTE — Assessment & Plan Note (Signed)
Jennifer [name]'s hemoglobin today is 11.1.  Her MCV continues to improve.  Will recheck b12 and iron studies today.  If continued improvement, considering her nausea with the iron, we will reduce her to iron nightly to help avoid nausea.  I let her know that we would call her with these results.

## 2022-12-28 NOTE — Telephone Encounter (Signed)
Attempted to call pt per note below. Left voicemail for pt to return call back to receive results.

## 2022-12-28 NOTE — Telephone Encounter (Signed)
-----   Message from Noreene Filbert sent at 12/26/2022 11:55 AM EDT ----- Please let patient know her iron and b12 are god. Continue b12 daily and ok to change to ferrous sulfate once a day in the evening. ----- Message ----- From: Leory Plowman, [facility] [place] Sent: 12/25/2022   4:42 PM EDT To: Loa Socks, NP

## 2022-12-28 NOTE — Assessment & Plan Note (Signed)
Will recheck b12 level today.  I recommended she continue supplementation, if not improved will check anti-parietal antibodies and intrinsic factor antibodies.

## 2022-12-31 ENCOUNTER — Telehealth: Payer: Self-pay

## 2022-12-31 NOTE — Telephone Encounter (Signed)
Called pt with lab results. Answered patients questions and was pleased with lab results.

## 2023-01-03 ENCOUNTER — Ambulatory Visit (HOSPITAL_COMMUNITY): Payer: Medicare Other

## 2023-01-16 ENCOUNTER — Ambulatory Visit: Payer: Medicare Other | Admitting: Internal Medicine

## 2023-01-22 ENCOUNTER — Telehealth: Payer: Self-pay | Admitting: Cardiology

## 2023-01-22 DIAGNOSIS — I4892 Unspecified atrial flutter: Secondary | ICD-10-CM

## 2023-01-22 NOTE — Telephone Encounter (Signed)
Left message for patient to call back  

## 2023-01-22 NOTE — Telephone Encounter (Signed)
  Gunnar Fusi - [employer] calling, she said, pt called them to ask if she can still wear her heart monitor, however, it already expired and they need a new order now from Dr. Lalla Brothers so that they can send a new heart monitor to the pt

## 2023-01-23 ENCOUNTER — Ambulatory Visit: Payer: Medicare Other | Attending: Cardiology

## 2023-01-23 ENCOUNTER — Ambulatory Visit: Payer: Medicare Other | Admitting: Neurology

## 2023-01-23 DIAGNOSIS — I4892 Unspecified atrial flutter: Secondary | ICD-10-CM

## 2023-01-23 NOTE — Telephone Encounter (Signed)
Patient returned RN Carlyle's call.

## 2023-01-23 NOTE — Telephone Encounter (Signed)
Spoke with the patient who states that she pulled out her monitor to wear it prior to her office visit with Dr. Lalla Brothers and it had a wear by date of 01/23/23. She spoke with [employer] and they advised her to not wear the monitor and send it back. We will need to place new orders for her to be sent a new monitor. Orders placed. Patient is aware to apply it right when she gets it and return after wearing for two weeks so that we will have results by the time she sees Dr. Lalla Brothers.

## 2023-01-23 NOTE — Progress Notes (Unsigned)
Enrolled patient for a 14 day Zio XT Monitor to be mailed to patients home  

## 2023-01-31 ENCOUNTER — Other Ambulatory Visit (HOSPITAL_COMMUNITY): Payer: Medicare Other

## 2023-02-06 ENCOUNTER — Encounter: Payer: Medicare Other | Admitting: Infectious Diseases

## 2023-02-13 ENCOUNTER — Encounter: Payer: Medicare Other | Admitting: Infectious Diseases

## 2023-02-14 ENCOUNTER — Other Ambulatory Visit: Payer: Self-pay | Admitting: Internal Medicine

## 2023-02-14 ENCOUNTER — Ambulatory Visit (HOSPITAL_COMMUNITY): Payer: Medicare Other | Attending: Cardiology

## 2023-02-14 DIAGNOSIS — I350 Nonrheumatic aortic (valve) stenosis: Secondary | ICD-10-CM | POA: Diagnosis not present

## 2023-02-14 DIAGNOSIS — I359 Nonrheumatic aortic valve disorder, unspecified: Secondary | ICD-10-CM | POA: Diagnosis not present

## 2023-02-14 LAB — ECHOCARDIOGRAM COMPLETE
AR max vel: 0.85 cm2
AV Area VTI: 0.89 cm2
AV Area mean vel: 0.91 cm2
AV Mean grad: 35 mm[Hg]
AV Peak grad: 56.6 mm[Hg]
Ao pk vel: 3.76 m/s
Area-P 1/2: 2.94 cm2
S' Lateral: 2.4 cm

## 2023-02-15 ENCOUNTER — Telehealth: Payer: Self-pay | Admitting: Internal Medicine

## 2023-02-15 NOTE — Telephone Encounter (Signed)
Pt returning the doctors phone call. Please advise

## 2023-02-15 NOTE — Telephone Encounter (Signed)
Called Patient in regard to echo Results.  Patient not available  Left Voicemail: calling about progression of aortic stenosis.  Recommend TAVR CT and potentially structural evaluation based on symptoms.    Jennifer Lam, MD Cardiologist [facility]  [address] [place], [address] [place], [place] [address] [phone]  2:44 PM

## 2023-02-18 ENCOUNTER — Telehealth: Payer: Self-pay | Admitting: Internal Medicine

## 2023-02-18 NOTE — Telephone Encounter (Signed)
Patient called back to discuss results.  Moderate to severe AS.  Ms. Jeppesen has a history of fibromyalgia, atrial flutter, and hyperlipidemia, reports worsening of their chronic pain since starting Crestor for cholesterol management. They describe the pain as nerve-like, affecting their entire body, and note that it has been particularly severe since starting the medication. The patient also reports occasional breathlessness, particularly when climbing stairs to their third-floor residence. They deny any acute dyspnea but note that their mobility is significantly impaired in the mornings due to pain, making it difficult to breathe.  The patient also has a history of polyps, which have caused bleeding issues exacerbated by their atrial flutter and anticoagulant therapy for stroke prevention. They have been referred to a cancer center due to low iron and B12 levels, and are currently on B12 and iron supplementation.  The patient also reports intermittent heart fluttering, which they note has been more frequent recently. She is due to start a 14-day heart monitor for further evaluation. They also report symptoms of neuropathy in their feet and hands, including numbness and difficulty walking. They are currently awaiting results from genetic testing for ATTR amyloidosis (she has not swabbed yet), which could potentially explain their neuropathic symptoms.  Atrial Flutter On anticoagulation for stroke prevention. Reports increased frequency of heart fluttering. She is started ziopatch per EP  Aortic Valve Disease - Experiences dyspnea and tachycardia on exertion, relieved by rest. Discussed re-evaluation of aortic valve if symptoms persist or worsen, with potential transcatheter intervention. Open-heart surgery is unlikely due to her comorbidities. - we will wait until her hgb is stable given her bleeding issues  Polyneuropathy - Worsening nerve pain since starting Crestor, with history of  statin-exacerbated fibromyalgia. Discussed possibility of ATTR polyneuropathy and need for genetic testing, provided for free by [employer]. Emphasized importance of identifying amyloidosis component. 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

## 2023-02-19 NOTE — Telephone Encounter (Signed)
MD spoke with pt on 02/18/23 please see encounter for more details.

## 2023-02-20 ENCOUNTER — Other Ambulatory Visit: Payer: Self-pay | Admitting: Internal Medicine

## 2023-02-25 ENCOUNTER — Other Ambulatory Visit: Payer: Self-pay | Admitting: Internal Medicine

## 2023-02-25 DIAGNOSIS — I4892 Unspecified atrial flutter: Secondary | ICD-10-CM

## 2023-02-26 NOTE — Telephone Encounter (Signed)
Prescription refill request for Eliquis received. Indication:aflutter Last office visit:5/24 Scr:1.04  9/24 Age: 77 Weight:94.5  kg  Prescription refilled

## 2023-02-27 ENCOUNTER — Ambulatory Visit: Payer: Medicare Other | Admitting: Cardiology

## 2023-03-05 ENCOUNTER — Encounter: Payer: Medicare Other | Admitting: Infectious Diseases

## 2023-03-12 ENCOUNTER — Ambulatory Visit: Payer: Medicare Other | Admitting: Internal Medicine

## 2023-03-13 DIAGNOSIS — N390 Urinary tract infection, site not specified: Secondary | ICD-10-CM | POA: Diagnosis not present

## 2023-03-14 DIAGNOSIS — M47816 Spondylosis without myelopathy or radiculopathy, lumbar region: Secondary | ICD-10-CM | POA: Diagnosis not present

## 2023-03-14 DIAGNOSIS — M533 Sacrococcygeal disorders, not elsewhere classified: Secondary | ICD-10-CM | POA: Diagnosis not present

## 2023-03-14 DIAGNOSIS — Z79899 Other long term (current) drug therapy: Secondary | ICD-10-CM | POA: Diagnosis not present

## 2023-03-14 DIAGNOSIS — M48062 Spinal stenosis, lumbar region with neurogenic claudication: Secondary | ICD-10-CM | POA: Diagnosis not present

## 2023-03-14 DIAGNOSIS — M5416 Radiculopathy, lumbar region: Secondary | ICD-10-CM | POA: Diagnosis not present

## 2023-03-14 DIAGNOSIS — M7061 Trochanteric bursitis, right hip: Secondary | ICD-10-CM | POA: Diagnosis not present

## 2023-03-14 DIAGNOSIS — M7918 Myalgia, other site: Secondary | ICD-10-CM | POA: Diagnosis not present

## 2023-03-19 ENCOUNTER — Telehealth: Payer: Self-pay | Admitting: Hematology and Oncology

## 2023-03-19 NOTE — Telephone Encounter (Signed)
Left patient regarding upcoming appointment change

## 2023-03-22 ENCOUNTER — Ambulatory Visit: Payer: Medicare Other | Admitting: Internal Medicine

## 2023-04-05 ENCOUNTER — Ambulatory Visit: Payer: Medicare Other | Admitting: Cardiology

## 2023-04-16 ENCOUNTER — Encounter: Payer: Medicare Other | Admitting: Infectious Diseases

## 2023-04-24 ENCOUNTER — Ambulatory Visit: Payer: Medicare Other | Admitting: Hematology and Oncology

## 2023-04-24 ENCOUNTER — Other Ambulatory Visit: Payer: Medicare Other

## 2023-04-26 ENCOUNTER — Other Ambulatory Visit: Payer: Medicare Other

## 2023-04-26 ENCOUNTER — Ambulatory Visit: Payer: Medicare Other | Admitting: Hematology and Oncology

## 2023-04-30 ENCOUNTER — Encounter: Payer: Self-pay | Admitting: Infectious Diseases

## 2023-04-30 ENCOUNTER — Other Ambulatory Visit: Payer: Self-pay

## 2023-04-30 ENCOUNTER — Ambulatory Visit: Payer: Medicare Other | Admitting: Infectious Diseases

## 2023-04-30 VITALS — BP 123/46 | HR 58 | Temp 98.3°F | Ht 63.0 in | Wt 206.6 lb

## 2023-04-30 DIAGNOSIS — Z23 Encounter for immunization: Secondary | ICD-10-CM

## 2023-04-30 DIAGNOSIS — N3090 Cystitis, unspecified without hematuria: Secondary | ICD-10-CM

## 2023-04-30 DIAGNOSIS — B379 Candidiasis, unspecified: Secondary | ICD-10-CM | POA: Diagnosis not present

## 2023-04-30 DIAGNOSIS — I5032 Chronic diastolic (congestive) heart failure: Secondary | ICD-10-CM | POA: Diagnosis not present

## 2023-04-30 DIAGNOSIS — I359 Nonrheumatic aortic valve disorder, unspecified: Secondary | ICD-10-CM

## 2023-04-30 DIAGNOSIS — I4892 Unspecified atrial flutter: Secondary | ICD-10-CM | POA: Diagnosis not present

## 2023-04-30 MED ORDER — FLUCONAZOLE 100 MG PO TABS
100.0000 mg | ORAL_TABLET | Freq: Every day | ORAL | 1 refills | Status: AC
Start: 2023-04-30 — End: 2023-05-07

## 2023-04-30 NOTE — Assessment & Plan Note (Signed)
Appreciate CV f/u.  Possible AVR?

## 2023-04-30 NOTE — Progress Notes (Signed)
Subjective:    Patient ID: Jennifer [name], female  DOB: [birth year], 78 y.o.        MRN: [id number]   HPI 78 yo F with hx HTN, fibromyalgia, gerd, possible IBS, and of cystoplasty 1984 at [facility]. She had 12 months of bladder infection afterwards. 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 She self cath's and has had difficulty with boiling her cath's.   Previously has been on macrobid, kelfex (abd rash/fungus), IV gent (01-2015), augmentin.     UCx (03-14-15) E coli- ESBL, (S- augmentin, zosyn, imipenem, gent/tobra, nitro).  She was seen in [place] 03-2015 and was given prn rx for augmentin as well as education on UTI and colonization.   UCx 11-7 E coli (> 100k) S- unasyn/augmentin, amikacin/gent, Ceftaz/Cefepime/cefoxitin, imi/mero/ertapenem, bactrim, zosyn, nitro. UCx11-2018 for UTI. UA (-) for LE and Nitr. + bacteria. She was given fosfomycin.       Her cx grew E coli- ESBL. s- nitro, augmentin, gent, imipenem.         UCx 05-2017 E coli (same sensi).   UCx 12-2018 E coli UCx 10-2019 K pneumo UCx 10-2019 E coli (ESBL)  09/20/2022: >100,000 E. Coli ESBL 08/24/2022: >100,000 K. Pneumoniae 08/09/2022: 60,000-100,000 60,000-100,000 E. Coli ESBL 08/21/2021: >100,000 CFU/ML Escherichia coli ESBL  07/28/2021: 50,000-100,000 CFU/mL Escherichia coli ESBL   She was seen by uro PA at [facility] 03-2024- she has f/u with Dr Logan Bores [phone].  Urine Culture,Surgical 60,000-100,000 CFU/ml Klebsiella pneumoniae ssp ozaenae Abnormal   Resulting Agency [facility](CLIA# [id number])  Susceptibility  Organism Antibiotic Method Susceptibility  Klebsiella pneumoniae ssp ozaenae MALDI ID MIC   Klebsiella pneumoniae ssp ozaenae Amoxicillin + Clavulanate MIC 16/8 ug/ml: Intermediate  Klebsiella pneumoniae ssp ozaenae Ampicillin MIC >16 ug/ml: Resistant  Klebsiella pneumoniae ssp ozaenae Ampicillin + Sulbactam MIC >16 ug/ml: Resistant   Klebsiella pneumoniae ssp ozaenae Cefazolin MIC 4 ug/ml: Susceptible  Klebsiella pneumoniae ssp ozaenae Cefotaxime MIC <=2 ug/ml: Susceptible  Klebsiella pneumoniae ssp ozaenae Ceftriaxone MIC <=1 ug/ml: Susceptible  Klebsiella pneumoniae ssp ozaenae Cefuroxime MIC <=4 ug/ml: Susceptible  Klebsiella pneumoniae ssp ozaenae Ciprofloxacin MIC <=0.25 ug/ml: Susceptible  Klebsiella pneumoniae ssp ozaenae Ertapenem MIC <=0.5 ug/ml: Susceptible  Klebsiella pneumoniae ssp ozaenae Gentamicin MIC <=2 ug/ml: Susceptible  Klebsiella pneumoniae ssp ozaenae Meropenem MIC <=1 ug/ml: Susceptible  Klebsiella pneumoniae ssp ozaenae Nitrofurantoin MIC <=32 ug/ml: Susceptible  Klebsiella pneumoniae ssp ozaenae Piperacillin + Tazobactam MIC <=8 ug/ml: Susceptible  Klebsiella pneumoniae ssp ozaenae Tetracycline MIC >8 ug/ml: Resistant  Klebsiella pneumoniae ssp ozaenae Trimethoprim + Sulfamethoxazole MIC >2/38 ug/ml: Resistant   She was given keflex after this cx- gave her diarrhea, fungal rash, mild nausea.    She continues to have back pain, has missed multiple appts since. Has not been able to get spinal injections due to her persistent UTIs. She was in ED waiting room for > 12hrs with palpitations and flutter. Was admitted. On multiple new heart meds.  She had GI eval 09-2022 and was found to have benign gastric polyps. Colon (-).  She has been seen at heme for iron deficiency anemia. She has been started on B12, iron.  She has f/u appts in Feb.  She had f/u TTE showing moderate/severe AS, stage 1 diastolic and EF 60-65%, mild LVH (concentric). She needs to wear heart monitor and potentially have resynchronization.  She was started on crestor but did not tolerate.   Has not had flu shot yet.  She needs to have her bladder surgery. Has to self cath more. Has been urinating more due lasix.  Has noted more dematophytes infections (intertriginous).   Health Maintenance  Topic Date Due   Medicare Annual  Wellness (AWV)  Never done   Zoster Vaccines- Shingrix (1 of 2) 09/21/1966   DEXA SCAN  Never done   COVID-19 Vaccine (3 - Moderna risk series) 02/29/2020   DTaP/Tdap/Td (2 - Td or Tdap) 05/26/2020   INFLUENZA VACCINE  11/01/2022   Colonoscopy  08/28/2032   Pneumonia Vaccine 51+ Years old  Completed   Hepatitis C Screening  Completed   HPV VACCINES  Aged Out      Review of Systems  Constitutional:  Positive for chills and weight loss (wt has come down to 206 from 212). Negative for fever.  HENT:  Positive for congestion.   Respiratory:  Positive for cough, sputum production and shortness of breath.   Cardiovascular:  Positive for palpitations. Negative for chest pain and leg swelling.  Gastrointestinal:  Negative for constipation and diarrhea.  Genitourinary:  Positive for frequency. Negative for dysuria and urgency.  Musculoskeletal:  Positive for back pain.  Skin:        Hair loss, posterior    Please see HPI. All other systems reviewed and negative.     Objective:  Physical Exam Vitals reviewed.  Constitutional:      Appearance: Normal appearance. She is obese.  HENT:     Mouth/Throat:     Mouth: Mucous membranes are moist.     Pharynx: No oropharyngeal exudate.  Eyes:     Extraocular Movements: Extraocular movements intact.     Pupils: Pupils are equal, round, and reactive to light.  Cardiovascular:     Rate and Rhythm: Normal rate and regular rhythm.     Heart sounds: Murmur heard.  Pulmonary:     Effort: Pulmonary effort is normal.     Breath sounds: Normal breath sounds.  Abdominal:     General: Bowel sounds are normal. There is no distension.     Palpations: Abdomen is soft.  Musculoskeletal:     Cervical back: Normal range of motion and neck supple.  Neurological:     Mental Status: She is alert.            Assessment & Plan:

## 2023-04-30 NOTE — Assessment & Plan Note (Signed)
Appreciate CV f/u.

## 2023-04-30 NOTE — Assessment & Plan Note (Addendum)
This appears to be mild at this point. We discussed her TTE in detail.  Perhaps AVR will improve? She has trace edema today.  Wt is coming down.  Will recheck her BP (123/46), ask her to f/u with CV about potential med adjustment.

## 2023-04-30 NOTE — Assessment & Plan Note (Signed)
Will try to use flucon sparingly, triamcinolone prn.  Her LFTs are normal.  Will try 7 days of flucon.

## 2023-04-30 NOTE — Assessment & Plan Note (Addendum)
She appears to be at her baseline Will hold off on further anbx at this point (will also decrease her topical candida).  Will be available to help her with this as she needs.  Will give her flu shot.

## 2023-05-01 ENCOUNTER — Telehealth: Payer: Self-pay | Admitting: Neurology

## 2023-05-01 DIAGNOSIS — I9589 Other hypotension: Secondary | ICD-10-CM | POA: Diagnosis not present

## 2023-05-01 DIAGNOSIS — Z6836 Body mass index (BMI) 36.0-36.9, adult: Secondary | ICD-10-CM | POA: Diagnosis not present

## 2023-05-01 DIAGNOSIS — M7989 Other specified soft tissue disorders: Secondary | ICD-10-CM | POA: Diagnosis not present

## 2023-05-01 DIAGNOSIS — E039 Hypothyroidism, unspecified: Secondary | ICD-10-CM | POA: Diagnosis not present

## 2023-05-01 NOTE — Telephone Encounter (Signed)
Pt call to verify appointment

## 2023-05-02 ENCOUNTER — Other Ambulatory Visit: Payer: Self-pay | Admitting: Internal Medicine

## 2023-05-08 ENCOUNTER — Ambulatory Visit: Payer: Medicare Other | Admitting: Hematology and Oncology

## 2023-05-08 ENCOUNTER — Other Ambulatory Visit: Payer: Medicare Other

## 2023-05-14 ENCOUNTER — Ambulatory Visit: Payer: Medicare Other | Admitting: Neurology

## 2023-05-15 ENCOUNTER — Ambulatory Visit: Payer: Medicare Other | Admitting: Cardiology

## 2023-05-16 ENCOUNTER — Ambulatory Visit: Payer: Medicare Other | Admitting: Nurse Practitioner

## 2023-05-22 ENCOUNTER — Ambulatory Visit: Payer: Medicare Other | Admitting: Hematology and Oncology

## 2023-05-22 ENCOUNTER — Other Ambulatory Visit: Payer: Medicare Other

## 2023-05-27 ENCOUNTER — Ambulatory Visit: Payer: Medicare Other | Attending: Internal Medicine | Admitting: Internal Medicine

## 2023-05-27 VITALS — BP 142/60 | HR 58 | Ht 63.0 in | Wt 210.0 lb

## 2023-05-27 DIAGNOSIS — E7800 Pure hypercholesterolemia, unspecified: Secondary | ICD-10-CM | POA: Diagnosis not present

## 2023-05-27 DIAGNOSIS — I25118 Atherosclerotic heart disease of native coronary artery with other forms of angina pectoris: Secondary | ICD-10-CM | POA: Insufficient documentation

## 2023-05-27 DIAGNOSIS — I10 Essential (primary) hypertension: Secondary | ICD-10-CM | POA: Insufficient documentation

## 2023-05-27 DIAGNOSIS — I4892 Unspecified atrial flutter: Secondary | ICD-10-CM | POA: Insufficient documentation

## 2023-05-27 DIAGNOSIS — I359 Nonrheumatic aortic valve disorder, unspecified: Secondary | ICD-10-CM | POA: Diagnosis not present

## 2023-05-27 MED ORDER — FUROSEMIDE 40 MG PO TABS: ORAL_TABLET | ORAL | 3 refills | Status: DC

## 2023-05-27 NOTE — Patient Instructions (Signed)
 Medication Instructions:  Your physician has recommended you make the following change in your medication:  MAY TAKE an additional furosemide 40 mg once daily as needed for swelling, shortness of breath or weight gain of 2-3 lbs in 24 hours or 5 lbs in a week  *If you need a refill on your cardiac medications before your next appointment, please call your pharmacy*   Lab Work: FLP, ALT  If you have labs (blood work) drawn today and your tests are completely normal, you will receive your results only by: MyChart Message (if you have MyChart) OR A paper copy in the mail If you have any lab test that is abnormal or we need to change your treatment, we will call you to review the results.   Testing/Procedures: Your physician has referred you to see [facility].   Your physician would like you to complete Genetic Testing.  You will receive a kit in the mail to complete testing.    Follow-Up:to be determined At [facility], you and your health needs are our priority.  As part of our continuing mission to provide you with exceptional heart care, we have created designated Provider Care Teams.  These Care Teams include your primary Cardiologist (physician) and Advanced Practice Providers (APPs -  Physician Assistants and Nurse Practitioners) who all work together to provide you with the care you need, when you need it.  We recommend signing up for the patient portal called "MyChart".  Sign up information is provided on this After Visit Summary.  MyChart is used to connect with patients for Virtual Visits (Telemedicine).  Patients are able to view lab/test results, encounter notes, upcoming appointments, etc.  Non-urgent messages can be sent to your provider as well.   To learn more about what you can do with MyChart, go to [email or web address].      Provider:   Christell Constant, MD

## 2023-05-27 NOTE — Progress Notes (Signed)
 Cardiology Office Note:  .    Date:  05/27/2023  ID:  Jennifer [name], DOB 04/07/1945, MRN [id number] PCP: Jarrett Soho, PA-C  [facility] Providers Cardiologist:  Christell Constant, MD Electrophysiologist:  Lanier Prude, MD     CC: Return to clinic- aortic stenosis eval   History of Present Illness: .    Jennifer [name] is a 78 year old female with coronary artery disease and aortic stenosis who presents with worsening symptoms and follow-up for cardiac conditions.  She presents for follow-up of her cardiac conditions, including coronary artery disease, aortic stenosis, and atrial flutter. She experiences significant back pain described as 'crippling' and swelling from her neck to her legs, particularly in her ankles. She continues to take Lasix, which she describes as 'killing my bladder', but did not take it today. She experiences frequent urination and has been sedentary due to back pain, which she believes is affecting her weight.  She has a history of coronary artery disease with diffuse coronary artery disease not amenable to intervention. She was diagnosed with aortic stenosis, which has progressed from moderate to severe. She experiences leg swelling and shortness of breath, which she attributes to fluid retention. She has been taking Lasix 40 mg daily and occasionally considers increasing the dose due to swelling.  She has a history of atrial flutter and has experienced palpitations. She was previously considered for an ablation due to medication intolerance. She continues to take Eliquis twice daily for anticoagulation.  She describes feeling 'like a sumo wrestler', with heaviness from her breasts down to her legs and belly. She experiences shortness of breath when getting up from bed and walking to the kitchen, which she attributes to back pain. She also hears her heart beating in her ear, particularly when lying on her left side, and experiences fluttering  sensations at times, which she associates with her atrial flutter.  She has a history of GERD and has been on Prilosec and Protonix. She experiences nausea and takes ondansetron as needed. She has a history of anemia and is on iron supplements, but her iron levels remain low. She experiences constipation occasionally and uses Miralax as needed.  She has a family history of heart disease, with her father developing heart disease at age 82. She feels exhausted with daily activities, such as washing and drying her hair, and experiences significant morning stiffness and pain, which she attributes to her back issues.   Relevant histories: .  Social  - friend of Shamea 2022:  Unamenable to intervention.  AS had issues with a variety of medications and advised to see allergy. 2023: Moderate AS.  Had re-assuring stress test. 2024: Positive PYP but with normal CMR/ECV.  ROS: As per HPI.   Studies Reviewed: .   Cardiac Studies & Procedures   ______________________________________________________________________________________________ CARDIAC CATHETERIZATION  CARDIAC CATHETERIZATION 09/16/2020  Narrative  Mid LAD lesion is 50% stenosed.  3rd Diag lesion is 60% stenosed.  Dist LAD-1 lesion is 90% stenosed.  Dist LAD-2 lesion is 40% stenosed.  1.  Significant one-vessel coronary artery disease involving the mid to distal LAD at the bifurcation of the third diagonal which has also 60% ostial stenosis.  The coronary arteries are very tortuous suggestive of hypertensive heart disease. 2.  Left ventricular angiography was not performed.  EF was normal by echo. 3.  Severely elevated blood pressure with mildly elevated left ventricular end-diastolic pressure. 4.  Transient left bundle branch block after engaging the left ventricle.  This resolved by the end of the case. 5.  Abdominal aortogram was performed and showed no evidence of renal artery stenosis.  Recommendations: Unfortunately, the  LAD stenosis is in a very tortuous segment and at the origin of the third diagonal branch which also has ostial disease.  PCI in this area will be high risk for dissection as well as sidebranch closure especially in the setting of uncontrolled hypertension.  In addition, the distal LAD after the stenosis is diffusely diseased. I recommend maximizing medical therapy and controlling blood pressure before considering PCI.  I added amlodipine to her antihypertensive medications. Other complicating issue is that the patient cannot tolerate aspirin long-term.  If we decide to proceed with PCI at some point, we will have to make sure she tolerates ticagrelor which can likely be used as monotherapy.  Findings Coronary Findings Diagnostic  Dominance: Right  Left Main Vessel is angiographically normal.  Left Anterior Descending Mid LAD lesion is 50% stenosed. Dist LAD-1 lesion is 90% stenosed. The lesion is mildly calcified. Dist LAD-2 lesion is 40% stenosed.  Third Diagonal Branch 3rd Diag lesion is 60% stenosed.  Left Circumflex The vessel exhibits minimal luminal irregularities.  Right Coronary Artery Vessel is large. Vessel is angiographically normal.  Intervention  No interventions have been documented.   STRESS TESTS  MYOCARDIAL PERFUSION IMAGING 01/12/2022  Narrative   The study is normal. The study is low risk.   No ST deviation was noted.   LV perfusion is normal. There is no evidence of ischemia. There is no evidence of infarction.   Left ventricular function is normal. Nuclear stress EF: 74 %. The left ventricular ejection fraction is hyperdynamic (>65%). End diastolic cavity size is mildly enlarged. End systolic cavity size is normal.   Prior study not available for comparison.   ECHOCARDIOGRAM  ECHOCARDIOGRAM COMPLETE 02/14/2023  Narrative ECHOCARDIOGRAM REPORT    Patient Name:   Jennifer [name] Date of Exam: 02/14/2023 Medical Rec #:  [id number]        Height:        63.0 in Accession #:    [id number]       Weight:       208.3 lb Date of Birth:  [birth year]        BSA:          1.967 m Patient Age:    77 years         BP:           200/90 mmHg Patient Gender: F                HR:           55 bpm. Exam Location:  [address]  Procedure: 2D Echo, 3D Echo, Cardiac Doppler, Color Doppler and Strain Analysis  Indications:    I35.9 Aortic Valve Disease  History:        Patient has prior history of Echocardiogram examinations, most recent 01/12/2022. CAD, Arrythmias:Atrial Flutter, LBBB and PVC; Risk Factors:Sleep Apnea.  Sonographer:    Clearence Ped RCS Referring Phys: [id number] Liel Rudden A Patsye Sullivant  IMPRESSIONS   1. Left ventricular ejection fraction, by estimation, is 60 to 65%. The left ventricle has normal function. The left ventricle has no regional wall motion abnormalities. There is mild concentric left ventricular hypertrophy. Left ventricular diastolic parameters are consistent with Grade I diastolic dysfunction (impaired relaxation). GLS -20.8% 2. Right ventricular systolic function is normal. The right ventricular size is normal. There is mildly elevated pulmonary  artery systolic pressure. 3. Left atrial size was mildly dilated. 4. The mitral valve is normal in structure. Mild mitral valve regurgitation. No evidence of mitral stenosis. 5. The aortic valve is severely calcified with restricted excursion of valve leaflets and flow acceleration across the valve. There is atleast moderate aortic stenosis with a mean PG of and AVA by VTI of 0.89cm2. There is severe AS by 2D planimetry. 6. Aortic dilatation noted. There is dilatation of the ascending aorta, measuring 41 mm. 7. The inferior vena cava is normal in size with greater than 50% respiratory variability, suggesting right atrial pressure of 3 mmHg.  FINDINGS Left Ventricle: Left ventricular ejection fraction, by estimation, is 60 to 65%. The left ventricle has normal function.  The left ventricle has no regional wall motion abnormalities. The left ventricular internal cavity size was normal in size. There is mild concentric left ventricular hypertrophy. Left ventricular diastolic parameters are consistent with Grade I diastolic dysfunction (impaired relaxation).  Right Ventricle: The right ventricular size is normal. No increase in right ventricular wall thickness. Right ventricular systolic function is normal. There is mildly elevated pulmonary artery systolic pressure. The tricuspid regurgitant velocity is 2.95 m/s, and with an assumed right atrial pressure of 3 mmHg, the estimated right ventricular systolic pressure is 37.8 mmHg.  Left Atrium: Left atrial size was mildly dilated.  Right Atrium: Right atrial size was normal in size.  Pericardium: There is no evidence of pericardial effusion.  Mitral Valve: The mitral valve is normal in structure. Mild mitral valve regurgitation. No evidence of mitral valve stenosis.  Tricuspid Valve: The tricuspid valve is normal in structure. Tricuspid valve regurgitation is not demonstrated. No evidence of tricuspid stenosis.  Aortic Valve: The aortic valve is abnormal. Aortic valve regurgitation is not visualized. Severe aortic stenosis is present. Aortic valve mean gradient measures 35.0 mmHg. Aortic valve peak gradient measures 56.6 mmHg. Aortic valve area, by VTI measures 0.89 cm.  Pulmonic Valve: The pulmonic valve was normal in structure. Pulmonic valve regurgitation is trivial. No evidence of pulmonic stenosis.  Aorta: The aortic root is normal in size and structure and aortic dilatation noted. There is dilatation of the ascending aorta, measuring 41 mm.  Venous: The inferior vena cava is normal in size with greater than 50% respiratory variability, suggesting right atrial pressure of 3 mmHg.  IAS/Shunts: No atrial level shunt detected by color flow Doppler.   LEFT VENTRICLE PLAX 2D LVIDd:         5.20 cm    Diastology LVIDs:         2.40 cm   LV e' medial:    5.87 cm/s LV PW:         1.20 cm   LV E/e' medial:  18.7 LV IVS:        1.20 cm   LV e' lateral:   11.40 cm/s LVOT diam:     1.90 cm   LV E/e' lateral: 9.6 LV SV:         91 LV SV Index:   46        2D Longitudinal Strain LVOT Area:     2.84 cm  2D Strain GLS (A2C):   -16.9 % 2D Strain GLS (A3C):   -25.0 % 2D Strain GLS (A4C):   -20.4 % 2D Strain GLS Avg:     -20.8 %  3D Volume EF: 3D EF:        75 % LV EDV:  130 ml LV ESV:       33 ml LV SV:        98 ml  RIGHT VENTRICLE RV Basal diam:  3.60 cm RV S prime:     15.40 cm/s TAPSE (M-mode): 3.5 cm RVSP:           37.8 mmHg  LEFT ATRIUM             Index        RIGHT ATRIUM           Index LA diam:        4.60 cm 2.34 cm/m   RA Pressure: 3.00 mmHg LA Vol (A2C):   68.0 ml 34.56 ml/m  RA Area:     11.30 cm LA Vol (A4C):   74.6 ml 37.92 ml/m  RA Volume:   19.60 ml  9.96 ml/m LA Biplane Vol: 72.3 ml 36.75 ml/m AORTIC VALVE AV Area (Vmax):    0.85 cm AV Area (Vmean):   0.91 cm AV Area (VTI):     0.89 cm AV Vmax:           376.00 cm/s AV Vmean:          284.000 cm/s AV VTI:            1.030 m AV Peak Grad:      56.6 mmHg AV Mean Grad:      35.0 mmHg LVOT Vmax:         113.00 cm/s LVOT Vmean:        90.700 cm/s LVOT VTI:          0.322 m LVOT/AV VTI ratio: 0.31  AORTA Ao Root diam: 3.00 cm Ao Asc diam:  4.10 cm  MITRAL VALVE                TRICUSPID VALVE MV Area (PHT):              TR Peak grad:   34.8 mmHg MV Decel Time:              TR Vmax:        295.00 cm/s MV E velocity: 110.00 cm/s  Estimated RAP:  3.00 mmHg MV A velocity: 116.00 cm/s  RVSP:           37.8 mmHg MV E/A ratio:  0.95 SHUNTS Systemic VTI:  0.32 m Systemic Diam: 1.90 cm  Aditya Sabharwal Electronically signed by Dorthula Nettles Signature Date/Time: 02/14/2023/6:42:26 PM    Final      CT SCANS  CT CORONARY MORPH W/CTA COR W/SCORE 09/15/2020  Addendum 09/15/2020  6:28  PM ADDENDUM REPORT: 09/15/2020 18:26  CLINICAL DATA:  78 Year-old Caucasian Female  EXAM: Cardiac/Coronary  CTA  TECHNIQUE: The patient was scanned on a [employer].  FINDINGS: A 100 kV prospective scan was triggered in the descending thoracic aorta at 111 HU's. Axial non-contrast 3 mm slices were carried out through the heart. The data set was analyzed on a dedicated work station and scored using the Agatson method. Gantry rotation speed was 250 msecs and collimation was .6 mm. No beta blockade and 0.8 mg of sl NTG was given. The 3D data set was reconstructed in 5% intervals of the 67-82 % of the R-R cycle. Diastolic phases were analyzed on a dedicated work station using MPR, MIP and VRT modes. The patient received 100 cc of contrast.  Aorta: Ascending aorta is mildly dilated for age and body surface area: 40 mm. No  calcifications in cardiac field of view. No dissection.  Main Pulmonary Artery: Mild dilation of the pulmonary artery: 32 mm with artery to aorta ratio of < 0.9.  Aortic Valve:  Tri-leaflet.  Mild annular calcification.  Coronary Arteries:  Normal coronary origin.  Right dominance.  Coronary Calcium Score:  Left main: 0  Left anterior descending artery: 156  Left circumflex artery: 0  Right coronary artery: 1  Total: 157  Percentile: 74th for age, sex, and race matched control.  RCA is a large dominant artery that gives rise to PDA and PLA. There is no plaque.  Left main is a large artery that gives rise to LAD and LCX arteries. There is no plaque.  LAD is a large vessel that gives rise to one small D1 Branch. There is a mild non obstructive stenosis (25-49%) calcific plaque in the proximal LAD. There is a moderate stenosis (50-70%) calcific plaque in the mid LAD. There is a moderate stenosis (50-70%) calcific plaque in the distal LAD.  LCX is a non-dominant artery.  There is no plaque.  There is a ramus intermedius vessel with  minimal non-obstructive (1-24%) soft plaque in the mid vessel.  Other findings:  Normal pulmonary vein drainage into the left atrium.  Normal left atrial appendage without a thrombus.  Cannot exclude small patent foramen ovale.  Extra-cardiac findings: See attached radiology report for non-cardiac structures.  IMPRESSION: 1. Coronary calcium score of 157. This was 74th percentile for age, sex, and race matched control.  2. Normal coronary origin with right dominance.  3. CAD-RADS 3. Moderate stenosis. Consider symptom-guided anti-ischemic pharmacotherapy as well as risk factor modification per guideline directed care. Additional analysis with CT FFR will be submitted.  4. Ascending aorta is mildly dilation for age and body surface area: 40 mm.  5. Mild dilation of the pulmonary artery.  6. Cannot exclude small patent foramen ovale.  Reaching out to primary team.  RECOMMENDATIONS:  Coronary artery calcium (CAC) score is a strong predictor of incident coronary heart disease (CHD) and provides predictive information beyond traditional risk factors. CAC scoring is reasonable to use in the decision to withhold, postpone, or initiate statin therapy in intermediate-risk or selected borderline-risk asymptomatic adults (age 31-75 years and LDL-C >=70 to <190 mg/dL) who do not have diabetes or established atherosclerotic cardiovascular disease (ASCVD).* In intermediate-risk (10-year ASCVD risk >=7.5% to <20%) adults or selected borderline-risk (10-year ASCVD risk >=5% to <7.5%) adults in whom a CAC score is measured for the purpose of making a treatment decision the following recommendations have been made:  If CAC = 0, it is reasonable to withhold statin therapy and reassess in 5 to 10 years, as long as higher risk conditions are absent (diabetes mellitus, family history of premature CHD in first degree relatives (males <55 years; females <65 years), cigarette smoking, LDL  >=190 mg/dL or other independent risk factors).  If CAC is 1 to 99, it is reasonable to initiate statin therapy for patients ?76 years of age.  If CAC is >=100 or >=75th percentile, it is reasonable to initiate statin therapy at any age.  Cardiology referral should be considered for patients with CAC scores =400 or >=75th percentile.  *2018 AHA/ACC/AACVPR/AAPA/ABC/ACPM/ADA/AGS/APhA/ASPC/NLA/PCNA Guideline on the Management of Blood Cholesterol: A Report of the American College of Cardiology/American Heart Association Task Force on Clinical Practice Guidelines. J Am Coll Cardiol. 2019;73(24):3168-3209.  Riley Lam, MD   Electronically Signed By: Riley Lam MD On: 09/15/2020 18:26  Narrative EXAM: OVER-READ  INTERPRETATION  CT CHEST  The following report is an over-read performed by radiologist Dr. Jeronimo Greaves of [facility] on 09/15/2020. This over-read does not include interpretation of cardiac or coronary anatomy or pathology. The coronary CTA interpretation by the cardiologist is attached.  COMPARISON:  Chest radiograph of earlier today.  FINDINGS: Vascular: Upper normal ascending aortic caliber, including 3.9 cm on 09/10. Aortic atherosclerosis. No central pulmonary embolism, on this non-dedicated study.  Mediastinum/Nodes: No imaged thoracic adenopathy. Tiny hiatal hernia.  Lungs/Pleura: No pleural fluid.  Clear imaged lungs.  Upper Abdomen: Moderate hepatic steatosis. Normal imaged portions of the spleen.  Musculoskeletal: Mild-to-moderate convex right lower thoracic spinal curvature.  IMPRESSION: 1. No acute findings in the imaged extracardiac chest. 2. Hepatic steatosis. 3. Tiny hiatal hernia. 4. Aortic Atherosclerosis (ICD10-I70.0).  Electronically Signed: By: Jeronimo Greaves M.D. On: 09/15/2020 15:51   CT SCANS  CT CORONARY FRACTIONAL FLOW RESERVE DATA PREP 09/16/2020  Narrative EXAM: CT-FFR  ANALYSIS  CLINICAL DATA:  Possibly obstructive coronary lesion in LAD  FINDINGS: CT-FFR analysis was performed on the original cardiac CT angiogram dataset. Diagrammatic representation of the CT-FFR analysis is provided in a separate PDF document in PACS. This dictation was created using the PDF document and an interactive 3D model of the results. 3D model is not available in the EMR/PACS. Normal FFR range is >0.80.  1. Left Main: No significant functional stenosis, CT-FFR 0.99.  2. LAD: Significant functional stenosis in mid-distal LAD, CT-FFR 0.67.  3. LCX: No significant functional stenosis, CT-FFR 0.92.  4. RCA: No significant functional stenosis, CT-FFR 0.96.  5. Ramus: No significant functional stenosis, CT-FFR 0.89.  : 1. CT FFR analysis shows evidence of significant functional stenosis in the LAD.  Riley Lam MD   Electronically Signed By: Riley Lam MD On: 09/16/2020 11:12   CARDIAC MRI  MR CARDIAC MORPHOLOGY W WO CONTRAST 07/25/2022  Narrative CLINICAL DATA:  38F with PYP scan suggestive of amyloid. Echo with EF 65-70%, moderate LVH, moderate AS  EXAM: CARDIAC MRI  TECHNIQUE: The patient was scanned on a 1.5 Tesla Siemens magnet. A dedicated cardiac coil was used. Functional imaging was done using Fiesta sequences. 2,3, and 4 chamber views were done to assess for RWMA's. Modified Simpson's rule using a short axis stack was used to calculate an ejection fraction on a dedicated work [employer]. The patient received 12 cc of Gadavist. After 10 minutes inversion recovery sequences were used to assess for infiltration and scar tissue. Phase contrast velocity mapping was performed above the aortic and pulmonic valves  CONTRAST:  12 cc  of Gadavist  FINDINGS: Left ventricle:  -Moderate asymmetric hypertrophy measuring up to 14mm in basal septum (8mm in posterior wall)  -Normal size  -Hyperdynamic systolic  function  -Mild ECV elevation (29%)  -RV insertion site LGE  LV EF:  73% (Normal 52-79%)  Absolute volumes:  LV EDV: (Normal 78-167 mL)  LV ESV: 48mL (Normal 21-64 mL)  LV SV: (Normal 52-114 mL)  CO: 7.0L/min (Normal 2.7-6.3 L/min)  Indexed volumes:  LV EDV: 73mL/sq-m (Normal 50-96 mL/sq-m)  LV ESV: 73mL/sq-m (Normal 10-40 mL/sq-m)  LV SV: 12mL/sq-m (Normal 33-64 mL/sq-m)  CI: 3.5L/min/sq-m (Normal 1.9-3.9 L/min/sq-m)  Right ventricle: Normal size and systolic function  RV EF: 57% (Normal 52-80%)  Absolute volumes:  RV EDV: (Normal 79-175 mL)  RV ESV: 75mL (Normal 13-75 mL)  RV SV: (Normal 56-110 mL)  CO: 5.6L/min (Normal 2.7-6 L/min)  Indexed volumes:  RV EDV: 56mL/sq-m (Normal 51-97 mL/sq-m)  RV ESV: 78mL/sq-m (Normal 9-42 mL/sq-m)  RV SV: 5mL/sq-m (Normal 35-61 mL/sq-m)  CI: 2.7L/min/sq-m (Normal 1.8-3.8 L/min/sq-m)  Left atrium: Mild enlargement  Right atrium: Normal size  Mitral valve: Trivial regurgitation  Aortic valve: No regurgitation  Tricuspid valve: Trivial regurgitation  Pulmonic valve: No regurgitation  Aorta: Dilated ascending aorta measuring 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  No evidence of cardiac amyloidosis  2. Moderate asymmetric LV hypertrophy measuring up to 14mm in basal septum (8mm in posterior wall), not meeting criteria for hypertrophic cardiomyopathy (<12mm)  3.  Normal LV size with hyperdynamic systolic function (EF 73%)  4.  Normal RV size and systolic function (EF 57%)  5. RV insertion site LGE, which is a nonspecific scar pattern often seen in setting of elevated pulmonary pressures  6.  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  Dilated main pulmonary artery measuring 31mm   Electronically Signed By: Epifanio Lesches M.D. On: 07/26/2022 22:10  PYP SCAN  MYOCARDIAL AMYLOID PLANAR AND SPECT  03/28/2022  Narrative   By semi-quantitative assessment scan is consistent with heart uptake equal to rib uptake-Grade 2. Heart to contralateral lung ratio is between 1-1.5, indeterminate for amyloid.   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   Prior study not available for comparison.  Conclusion  2) Strongly suggestive of TTR amyloidosis or (Strongly suggestive: A semi-quantitative visual score of 2 or 3 or H/CL ratio >1.5)  **If echo/CMR are strongly positive, and negative, consider further evaluation including endomyocardial biopsy  (Note: A negative or mildly positive PYP does not exclude AL amyloid. In addition, equivocal results could represent AL amyloid or early TTR amyloid)  71mTechnetium-Pyrophosphate Imaging  for Transthyretin Cardiac Amyloidosis - [facility] Practice Points, Zachery Dakins, et. Al.  ______________________________________________________________________________________________      Physical Exam:    VS:  BP (!) 142/60 (BP Location: Left Arm)   Pulse (!) 58   Ht 5\' 3"  (1.6 m)   Wt 210 lb (95.3 kg)   SpO2 95%   BMI 37.20 kg/m    Wt Readings from Last 3 Encounters:  05/27/23 210 lb (95.3 kg)  04/30/23 206 lb 9.6 oz (93.7 kg)  12/25/22 208 lb 4.8 oz (94.5 kg)    Gen: no distress Neck: No JVD  Ears:  Homero Fellers Sign Cardiac: No Rubs or Gallops, harsh systolic murmur with near absent A2, RRR +2 radial pulses Respiratory: Clear to auscultation bilaterally, normal effort, normal  respiratory rate GI: Soft, nontender, non-distended  MS: Non pitting bilateral  edema;  moves all extremities Integument: Skin feels warm Neuro:  At time of evaluation, alert and oriented to person/place/time/situation  Psych: Normal affect, patient feels ok   ASSESSMENT AND PLAN: .    Aortic Stenosis Moderate to severe aortic stenosis with symptoms of dyspnea, leg edema, and elevated BNP. Condition has progressed since the last  echocardiogram in November 2024. Discussed valve replacement via a percutaneous approach due to her age and overall health. Open heart surgery is not recommended given her medical history. Explained that the percutaneous approach involves accessing the valve through the groin, which is less invasive and has a quicker recovery time. She will likely be discharged the next day after the procedure. - Refer to structural heart clinic for evaluation and potential valve replacement - Repeat heart catheterization to assess congestion and obtain necessary measurements - Order  CT scan for detailed anatomical assessment Symptoms include leg edema, dyspnea, and elevated BNP. She is on Lasix but still experiences significant fluid retention. Discussed balancing fluid management to avoid renal issues while reducing edema. - Increase Lasix to  40 mg PO Daily and 40 mg PO PRN leg swelling   Coronary Artery Disease Diffuse coronary artery disease not amenable to minimal intervention. She has angina and hyperlipidemia. Current medications have improved blood pressure control. Discussed risks of not being aggressive with cholesterol management, including potential progression of coronary disease. - Check cholesterol levels today - Discuss potential for PCSK9i in the future  Paroxysmal Atrial Flutter Atrial flutter with occasional palpitations. She is currently in rhythm and on Eliquis for anticoagulation. Discussed that the fluttering sensation is likely due to atrial flutter, while the booming sound in her ear is not related. 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 Discussed potential for genetic testing to confirm ATTR and possible treatment if confirmed. - Send genetic testing kit for ATTR via [employer] - Discuss potential treatment options if genetic  testing confirms diagnosis (vutisiran and vitamin A start)  Iron Deficiency Anemia Low iron levels and anemia noted in recent blood work. She is seeing a specialist for iron supplementation. - Follow up with hematologist  General Health Maintenance Compliant with heart medications and recent blood work and colonoscopy. - Check cholesterol levels today - Continue current medications and follow up as needed  Follow-up - Follow up with structural heart clinic - Schedule repeat heart catheterization and CT scan after this   Time Spent Directly with Patient:   I have spent a total of 48 minutes with the patient reviewing notes, imaging, EKGs, labs and examining the patient as well as establishing an assessment and plan that was discussed personally with the patient. Reviewed his medications as well.    Riley Lam, MD FASE [facility] Cardiologist [facility]  [address] [place], [address] [place], [place] [address] [phone]  2:03 PM

## 2023-05-28 LAB — LIPID PANEL
Chol/HDL Ratio: 4 {ratio} (ref 0.0–4.4)
Cholesterol, Total: 208 mg/dL — ABNORMAL HIGH (ref 100–199)
HDL: 52 mg/dL (ref >39)
LDL Chol Calc (NIH): 128 mg/dL — ABNORMAL HIGH (ref 0–99)
Triglycerides: 159 mg/dL — ABNORMAL HIGH (ref 0–149)
VLDL Cholesterol Cal: 28 mg/dL (ref 5–40)

## 2023-05-28 LAB — ALT: ALT: 12 [IU]/L (ref 0–32)

## 2023-05-29 ENCOUNTER — Telehealth: Payer: Self-pay | Admitting: Internal Medicine

## 2023-05-29 DIAGNOSIS — E7800 Pure hypercholesterolemia, unspecified: Secondary | ICD-10-CM

## 2023-05-29 NOTE — Telephone Encounter (Signed)
 Returning call to a nurse. Please call her back before 10 on Thursday

## 2023-05-30 ENCOUNTER — Inpatient Hospital Stay (HOSPITAL_BASED_OUTPATIENT_CLINIC_OR_DEPARTMENT_OTHER): Payer: Medicare Other | Admitting: Hematology and Oncology

## 2023-05-30 ENCOUNTER — Inpatient Hospital Stay: Payer: Medicare Other | Attending: Hematology and Oncology

## 2023-05-30 VITALS — BP 150/45 | HR 87 | Temp 97.3°F | Resp 18 | Wt 209.0 lb

## 2023-05-30 DIAGNOSIS — Z79899 Other long term (current) drug therapy: Secondary | ICD-10-CM | POA: Diagnosis not present

## 2023-05-30 DIAGNOSIS — D509 Iron deficiency anemia, unspecified: Secondary | ICD-10-CM

## 2023-05-30 DIAGNOSIS — E538 Deficiency of other specified B group vitamins: Secondary | ICD-10-CM

## 2023-05-30 DIAGNOSIS — I350 Nonrheumatic aortic (valve) stenosis: Secondary | ICD-10-CM | POA: Insufficient documentation

## 2023-05-30 DIAGNOSIS — M797 Fibromyalgia: Secondary | ICD-10-CM | POA: Insufficient documentation

## 2023-05-30 LAB — CBC WITH DIFFERENTIAL (CANCER CENTER ONLY)
Abs Immature Granulocytes: 0.02 10*3/uL (ref 0.00–0.07)
Basophils Absolute: 0 10*3/uL (ref 0.0–0.1)
Basophils Relative: 1 %
Eosinophils Absolute: 0.2 10*3/uL (ref 0.0–0.5)
Eosinophils Relative: 2 %
HCT: 34.5 % — ABNORMAL LOW (ref 36.0–46.0)
Hemoglobin: 10.8 g/dL — ABNORMAL LOW (ref 12.0–15.0)
Immature Granulocytes: 0 %
Lymphocytes Relative: 24 %
Lymphs Abs: 1.9 10*3/uL (ref 0.7–4.0)
MCH: 28.1 pg (ref 26.0–34.0)
MCHC: 31.3 g/dL (ref 30.0–36.0)
MCV: 89.6 fL (ref 80.0–100.0)
Monocytes Absolute: 0.7 10*3/uL (ref 0.1–1.0)
Monocytes Relative: 9 %
Neutro Abs: 4.9 10*3/uL (ref 1.7–7.7)
Neutrophils Relative %: 64 %
Platelet Count: 285 10*3/uL (ref 150–400)
RBC: 3.85 MIL/uL — ABNORMAL LOW (ref 3.87–5.11)
RDW: 13.6 % (ref 11.5–15.5)
WBC Count: 7.7 10*3/uL (ref 4.0–10.5)
nRBC: 0 % (ref 0.0–0.2)

## 2023-05-30 LAB — CMP (CANCER CENTER ONLY)
ALT: 11 U/L (ref 0–44)
AST: 14 U/L — ABNORMAL LOW (ref 15–41)
Albumin: 4.2 g/dL (ref 3.5–5.0)
Alkaline Phosphatase: 68 U/L (ref 38–126)
Anion gap: 6 (ref 5–15)
BUN: 27 mg/dL — ABNORMAL HIGH (ref 8–23)
CO2: 31 mmol/L (ref 22–32)
Calcium: 9.1 mg/dL (ref 8.9–10.3)
Chloride: 103 mmol/L (ref 98–111)
Creatinine: 1.12 mg/dL — ABNORMAL HIGH (ref 0.44–1.00)
GFR, Estimated: 51 mL/min — ABNORMAL LOW (ref >60)
Glucose, Bld: 93 mg/dL (ref 70–99)
Potassium: 4.5 mmol/L (ref 3.5–5.1)
Sodium: 140 mmol/L (ref 135–145)
Total Bilirubin: 0.5 mg/dL (ref 0.0–1.2)
Total Protein: 7.2 g/dL (ref 6.5–8.1)

## 2023-05-30 LAB — VITAMIN B12: Vitamin B-12: 1047 pg/mL — ABNORMAL HIGH (ref 180–914)

## 2023-05-30 LAB — FERRITIN: Ferritin: 25 ng/mL (ref 11–307)

## 2023-05-30 NOTE — Progress Notes (Signed)
  [facility]cer Center CONSULT NOTE  Patient Care Team: Jarrett Soho, PA-C as PCP - General (Family Medicine) Christell Constant, MD as PCP - Cardiology (Cardiology) Lanier Prude, MD as PCP - Electrophysiology (Cardiology) Jamison Neighbor, MD as Consulting Physician (Urology) Denice Paradise as Pharmacist Allena Katz, Janus Delene Ruffini, MD as Referring Physician (Anesthesiology) Anthonette Legato, Dell Ponto (Physician Assistant) Rachel Moulds, MD as Consulting Physician (Hematology and Oncology) Kerin Salen, MD as Consulting Physician (Gastroenterology)  CHIEF COMPLAINTS/PURPOSE OF CONSULTATION:  IDA  ASSESSMENT & PLAN:   This is a pleasant 78 year old female patient with past medical history significant for urinary bladder replacement, atrial flutter referred to hematology for iron deficiency anemia.    Iron Deficiency Anemia Persistent low-normal ferritin levels with ongoing fatigue and weakness. Discussed the interpretation of iron studies and the potential benefits and side effects of iron infusion. -Continue iron supplementation 65mg  daily with food. -Reduce Vitamin B12 supplementation to twice weekly due to elevated levels. Once again, will await B12 levels from today as well. -Await results of today's ferritin level to determine need for iron infusion.  Fibromyalgia Reports of increased pain and fatigue. -Continue current pain management regimen.  Aortic Stenosis Reports of fatigue and weakness, possibly related to aortic stenosis. Upcoming consultation for potential valve replacement. -Continue current cardiac medications. -Consultation with cardiologist on June 21, 2023 for valve evaluation.  Follow-up in 6 months or sooner if iron infusion is indicated.  HISTORY OF PRESENTING ILLNESS:  Jennifer [name] 78 y.o. female is here because of IDA.  Jennifer [name] is a 78 year old female with anemia who presents with fatigue and low iron levels. She was  referred by [facility] for evaluation of her low red blood cell count and iron levels.  She has been experiencing ongoing issues with anemia for several years. She takes iron supplements at a dose of 65 mg once daily and B12 supplements at 1000 mcg daily. Her iron levels have been low normal, with a ferritin level of 25 in September, up from 21 nine months prior. Hemoglobin levels have ranged from 8.7 to 10.8 over the past two years. She experiences fatigue, weakness, and cold intolerance, which may be related to her anemia.   She has a history of coronary artery disease and was hospitalized in June 2022. At that time, she was on 20 blood pressure medications per day, now reduced to 13. She attributes significant fatigue to her heart condition and anemia. She experiences chills at night lasting 30 to 45 minutes and has noted a general increase in fatigue since her heart issues began.   She reports back pain due to spinal stenosis and sciatica, for which she takes gabapentin and hydrocodone. This contributes to her lethargy.  She is currently on Lasix, which was recently increased to 80 mg to manage swelling, particularly in her calves. She also takes medications for GERD, including Protonix and sucralfate, and experiences nausea, which she attributes to her iron supplements.  Rest of the pertinent 10 point ROS reviewed and negative.  MEDICAL HISTORY:  Past Medical History:  Diagnosis Date   Acute meniscal tear of left knee    FOLLOWED BY DR GIAFFREY   Anemia    Aortic stenosis    Ascending aorta dilation (HCC)    Atrial flutter (HCC)    AV block, Mobitz 1    BPPV (benign paroxysmal positional vertigo)    CAD (coronary artery disease)    Chronic bladder pain    Chronic fatigue  syndrome    Chronic low back pain    W/ RIGHT LEG PAIN AND NUMBNESS   Cystitis, chronic    Fibromyalgia    GERD (gastroesophageal reflux disease)    Hiatal hernia    Hypertension    IC (interstitial cystitis)     LBBB (left bundle branch block)    transient hx   OSA (obstructive sleep apnea)    per pt study yrs ago-- moderate osa ,  intolerant cpap   Pinched vertebral nerve    bilateral L2 -- L3 and L3 -- L4-/  epidural injection's treatment , PT and pain clince   PONV (postoperative nausea and vomiting)    severe   PVC's (premature ventricular contractions)    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 made from [name]    Self-catheterizes urinary bladder    Spinal stenosis, lumbar region with neurogenic claudication    Wears glasses    Wears partial dentures    upper and lower    SURGICAL HISTORY: Past Surgical History:  Procedure Laterality Date   ABDOMINAL AORTOGRAM N/A 09/16/2020   Procedure: ABDOMINAL AORTOGRAM;  Surgeon: Iran Ouch, MD;  [facility];  Service: Cardiovascular;  Laterality: N/A;   ABDOMINAL HYSTERECTOMY  1980   w/ Left Salpingoophorectomy   BIOPSY  08/27/2021   Procedure: BIOPSY;  Surgeon: Vida Rigger, MD;  Location: [facility] ENDOSCOPY;  Service: Gastroenterology;;   CARDIAC CATHETERIZATION  08-21-2001  dr Verdis Prime   normal coronary arteries and LVF   CARPAL TUNNEL RELEASE Bilateral 1986   CECALCYSTOPLASTY/  APPENDECTOMY  1984   at [facility]   CHOLECYSTECTOMY  1992   CYSTO WITH HYDRODISTENSION N/A 01/12/2016   Procedure: CYSTOSCOPY/HYDRODISTENSION;  Surgeon: Jethro Bolus, MD;  Location: [facility];  Service: Urology;  Laterality: N/A;   CYSTO WITH HYDRODISTENSION N/A 08/30/2016   Procedure: CYSTOSCOPY/HYDRODISTENSION OF BLADDER INJECTION OF MARCAINE/PYRIDIUM;  Surgeon: Jethro Bolus, MD;  Location[facility];  Service: Urology;  Laterality: N/A;   ESOPHAGOGASTRODUODENOSCOPY (EGD) WITH PROPOFOL N/A 08/27/2021   Procedure: ESOPHAGOGASTRODUODENOSCOPY (EGD) WITH PROPOFOL;  Surgeon: Vida Rigger, MD;  Location: [facility] ENDOSCOPY;  Service: Gastroenterology;  Laterality: N/A;   LEFT HEART CATH AND  CORONARY ANGIOGRAPHY N/A 09/16/2020   Procedure: LEFT HEART CATH AND CORONARY ANGIOGRAPHY;  Surgeon: Iran Ouch, MD;  Location: [facility] INVASIVE CV LAB;  Service: Cardiovascular;  Laterality: N/A;   MULTIPLE CYSTO/  HYDRODISTENTION/  INSTILLATION THERAPY  last one 08-19-2009   NASAL SINUS SURGERY  1987   TONSILLECTOMY  as child   VAGINAL GROWTH REMOVED  as teen    SOCIAL HISTORY: Social History   Socioeconomic History   Marital status: Single    Spouse name: none   Number of children: 0   Years of education: College   Highest education level: Not on file  Occupational History   Occupation: n/a  Tobacco Use   Smoking status: Never   Smokeless tobacco: Never  Vaping Use   Vaping status: Never Used  Substance and Sexual Activity   Alcohol use: No    Alcohol/week: 0.0 standard drinks of alcohol   Drug use: No   Sexual activity: Not on file  Other Topics Concern   Not on file  Social History Narrative   Lives alone   Sandrea Matte, RN    Caffeine use: 2 glasses tea/day   Brother   Mother passed in ?106 at 63, father passed in 52's in 2006  She was a Runner, broadcasting/film/video      Social Drivers of Corporate investment banker Strain: Low Risk  (03/22/2022)   Overall Financial Resource Strain (CARDIA)    Difficulty of Paying Living Expenses: Not very hard  Food Insecurity: No Food Insecurity (07/19/2022)   Hunger Vital Sign    Worried About Running Out of Food in the Last Year: Never true    Ran Out of Food in the Last Year: Never true  Transportation Needs: No Transportation Needs (07/19/2022)   PRAPARE - Administrator, Civil Service (Medical): No    Lack of Transportation (Non-Medical): No  Physical Activity: Not on file  Stress: No Stress Concern Present (03/22/2022)   [employer] of Occupational Health - Occupational Stress Questionnaire    Feeling of Stress : Only a little  Social Connections: Moderately Integrated (05/15/2021)   Social Connection and  Isolation Panel [NHANES]    Frequency of Communication with Friends and Family: Twice a week    Frequency of Social Gatherings with Friends and Family: Twice a week    Attends Religious Services: 1 to 4 times per year    Active Member of [employer] or Organizations: Yes    Attends Banker Meetings: 1 to 4 times per year    Marital Status: Never married  Intimate Partner Violence: Not At Risk (05/15/2021)   Humiliation, Afraid, Rape, and Kick questionnaire    Fear of Current or Ex-Partner: No    Emotionally Abused: No    Physically Abused: No    Sexually Abused: No    FAMILY HISTORY: Family History  Problem Relation Age of Onset   CAD Father    Asthma Father    Hypertension Father    Alzheimer's disease Father    Emphysema Father    COPD Father    Heart failure Mother    Peripheral vascular disease Mother    Stroke Mother    Hypertension Mother    Colonic polyp Mother    Cancer Brother        melanoma    ALLERGIES:  is allergic to moxifloxacin, quinolones, sulfa antibiotics, furosemide, aspirin, doxazosin, duloxetine, duloxetine hcl, hydrochlorothiazide, ibuprofen, nebivolol hcl, other, ranexa [ranolazine], robaxin [methocarbamol], topiramate, xarelto [rivaroxaban], zofran [ondansetron hcl], codeine, dimethyl sulfoxide, macrodantin [nitrofurantoin macrocrystal], nitrofurantoin, tape, and yellow dye.  MEDICATIONS:  Current Outpatient Medications  Medication Sig Dispense Refill   acetaminophen (TYLENOL) 500 MG tablet Take 500 mg by mouth 2 (two) times daily as needed (for pain).     aluminum hydroxide-magnesium carbonate (GAVISCON) 95-358 MG/15ML SUSP Take by mouth as needed for indigestion or heartburn.     cyanocobalamin (VITAMIN B12) 1000 MCG tablet Take 1,000 mcg by mouth daily.     dextromethorphan-guaiFENesin (MUCINEX DM) 30-600 MG 12hr tablet Take 1 tablet by mouth every 12 (twelve) hours as needed for cough.     ELIQUIS 5 MG TABS tablet TAKE 1 TABLET BY MOUTH  TWICE A DAY 180 tablet 1   fexofenadine (ALLEGRA) 60 MG tablet Take 60 mg by mouth daily as needed for allergies.     fluconazole (DIFLUCAN) 100 MG tablet Take 100 mg by mouth daily.     furosemide (LASIX) 40 MG tablet May take an extra dose once daily as needed for swelling 90 tablet 3   gabapentin (NEURONTIN) 300 MG capsule Take 300 mg by mouth at bedtime.     hydrALAZINE (APRESOLINE) 50 MG tablet TAKE 1 TABLET BY MOUTH THREE TIMES A DAY 270 tablet  1   HYDROcodone-acetaminophen (NORCO) 7.5-325 MG tablet Take 1 tablet by mouth 3 (three) times daily as needed.     hydrocortisone (ANUSOL-HC) 25 MG suppository Place 25 mg rectally 2 (two) times daily as needed for hemorrhoids.     isosorbide dinitrate (ISORDIL) 10 MG tablet TAKE 1 TABLET BY MOUTH THREE TIMES A DAY 270 tablet 1   losartan (COZAAR) 100 MG tablet TAKE 1 TABLET BY MOUTH EVERY DAY 90 tablet 1   meclizine (ANTIVERT) 25 MG tablet Take 25 mg by mouth 3 (three) times daily as needed for dizziness.     Methen-Hyosc-Meth Blue-Na Phos (UROGESIC-BLUE) 81.6 MG TABS Take 1 tablet by mouth every 6 (six) hours as needed (urinary burning).     metoprolol tartrate (LOPRESSOR) 25 MG tablet TAKE 1 TABLET BY MOUTH TWICE A DAY 180 tablet 3   nitroGLYCERIN (NITROSTAT) 0.4 MG SL tablet PLACE 1 TABLET UNDER THE TONGE EVERY 5 MINUTES AS NEEDED FOR CHEST PAIN *TAKE UP TO 3 TABLETS* 25 tablet 3   nystatin cream (MYCOSTATIN) Apply topically 2 (two) times daily. 30 g 3   ondansetron (ZOFRAN-ODT) 4 MG disintegrating tablet Take 4 mg by mouth every 8 (eight) hours as needed for nausea (dissolve orally).  1   pantoprazole (PROTONIX) 40 MG tablet Take 40 mg by mouth 2 (two) times daily.     polyethylene glycol powder (GLYCOLAX/MIRALAX) 17 GM/SCOOP powder Take 17 g by mouth daily as needed for mild constipation.     rosuvastatin (CRESTOR) 5 MG tablet Take 1 tablet (5 mg total) by mouth 3 (three) times a week. 36 tablet 3   senna (SENOKOT) 8.6 MG tablet Take 1 tablet  by mouth as needed for constipation.     spironolactone (ALDACTONE) 25 MG tablet TAKE 1 TABLET (25 MG TOTAL) BY MOUTH DAILY. 90 tablet 1   sucralfate (CARAFATE) 1 g tablet Take 1 tablet (1 g total) by mouth 4 (four) times daily -  with meals and at bedtime. (Patient taking differently: Take 1 g by mouth daily at 6 (six) AM. 1 tab in am, 1 tab in pm) 120 tablet 3   triamcinolone cream (KENALOG) 0.1 % Apply 1 application. topically 2 (two) times daily as needed (rash).     No current facility-administered medications for this visit.     PHYSICAL EXAMINATION: ECOG PERFORMANCE STATUS: 1 - Symptomatic but completely ambulatory  There were no vitals filed for this visit.  There were no vitals filed for this visit.   GENERAL:alert, no distress and comfortable SKIN: skin color, texture, turgor are normal, no rashes or significant lesions EYES: normal, conjunctiva are pink and non-injected, sclera clear OROPHARYNX:no exudate, no erythema and lips, buccal mucosa, and tongue normal  NECK: supple, thyroid normal size, non-tender, without nodularity LYMPH:  no palpable lymphadenopathy in the cervical, axillary LUNGS: clear to auscultation and percussion with normal breathing effort HEART: regular rate & rhythm and no murmurs  ABDOMEN:abdomen soft, non-tender and normal bowel sounds Musculoskeletal:no cyanosis of digits and no clubbing  PSYCH: alert & oriented x 3 with fluent speech NEURO: no focal motor/sensory deficits  LABORATORY DATA:  I have reviewed the data as listed Lab Results  Component Value Date   WBC 7.7 05/30/2023   HGB 10.8 (L) 05/30/2023   HCT 34.5 (L) 05/30/2023   MCV 89.6 05/30/2023   PLT 285 05/30/2023     Chemistry      Component Value Date/Time   NA 140 05/30/2023 1319   NA 142 06/08/2022 1502  K 4.5 05/30/2023 1319   CL 103 05/30/2023 1319   CO2 31 05/30/2023 1319   BUN 27 (H) 05/30/2023 1319   BUN 17 06/08/2022 1502   CREATININE 1.12 (H) 05/30/2023 1319       Component Value Date/Time   CALCIUM 9.1 05/30/2023 1319   ALKPHOS 68 05/30/2023 1319   AST 14 (L) 05/30/2023 1319   ALT 11 05/30/2023 1319   BILITOT 0.5 05/30/2023 1319       RADIOGRAPHIC STUDIES: I have personally reviewed the radiological images as listed and agreed with the findings in the report. No results found.  All questions were answered. The patient knows to call the clinic with any problems, questions or concerns. I spent 30 minutes in the care of this patient including H and P, review of records, counseling and coordination of care.     Rachel Moulds, MD 05/30/2023 2:33 PM

## 2023-05-30 NOTE — Telephone Encounter (Signed)
 Left a message to call back.

## 2023-05-31 NOTE — Telephone Encounter (Signed)
 Patient is returning call and requesting to speak directly with Jennifer [name] only.

## 2023-06-04 NOTE — Telephone Encounter (Signed)
 Left a message to call back.

## 2023-06-07 ENCOUNTER — Other Ambulatory Visit: Payer: Self-pay | Admitting: Internal Medicine

## 2023-06-10 MED ORDER — EZETIMIBE 10 MG PO TABS: 10.0000 mg | ORAL_TABLET | Freq: Every day | ORAL | 3 refills | Status: AC

## 2023-06-10 NOTE — Telephone Encounter (Signed)
 The patient has been notified of the result and verbalized understanding.  All questions (if any) were answered. Macie Burows, RN 06/10/2023 5:10 PM   Lab orders placed and released for draw in June.  Pt request lab results be sent to PCP; routed via EPIC.

## 2023-06-11 ENCOUNTER — Telehealth: Payer: Self-pay | Admitting: *Deleted

## 2023-06-11 ENCOUNTER — Telehealth: Payer: Self-pay | Admitting: Hematology and Oncology

## 2023-06-11 NOTE — Telephone Encounter (Addendum)
-----   Message from [place] sent at 06/10/2023  2:40 PM EDT ----- Ferritin low normal, recommend oral iron ferrous sulfate 65 mg once daily. I believe she is already on this dose.Ok to discontinue B12 supplementation. No urgent need for Iron infusion.  Detailed message left on identified VM per above.

## 2023-06-11 NOTE — Telephone Encounter (Signed)
 Received patient's voicemail on 06/11/2023; left patient a message as a reminder for appointments scheduled in August 2025 left callback number if patient needs to reschedule or change appointment times

## 2023-06-12 ENCOUNTER — Telehealth: Payer: Self-pay | Admitting: *Deleted

## 2023-06-12 NOTE — Telephone Encounter (Signed)
 Pt called to f/u about labs and message that was left from desk nurse yesterday. Went through labs and results and routed labs to pt PCP Jarrett Soho, MD for review. Pt was appreciative and verbalized understanding

## 2023-06-20 ENCOUNTER — Ambulatory Visit: Payer: Medicare Other | Admitting: Cardiology

## 2023-06-21 ENCOUNTER — Ambulatory Visit: Payer: Medicare Other | Admitting: Cardiovascular Disease

## 2023-07-01 ENCOUNTER — Ambulatory Visit: Admitting: Cardiovascular Disease

## 2023-07-12 DIAGNOSIS — M7989 Other specified soft tissue disorders: Secondary | ICD-10-CM | POA: Diagnosis not present

## 2023-07-12 DIAGNOSIS — Z6837 Body mass index (BMI) 37.0-37.9, adult: Secondary | ICD-10-CM | POA: Diagnosis not present

## 2023-07-12 DIAGNOSIS — I700 Atherosclerosis of aorta: Secondary | ICD-10-CM | POA: Diagnosis not present

## 2023-07-12 DIAGNOSIS — E039 Hypothyroidism, unspecified: Secondary | ICD-10-CM | POA: Diagnosis not present

## 2023-07-12 DIAGNOSIS — I350 Nonrheumatic aortic (valve) stenosis: Secondary | ICD-10-CM | POA: Diagnosis not present

## 2023-07-12 DIAGNOSIS — Z7901 Long term (current) use of anticoagulants: Secondary | ICD-10-CM | POA: Diagnosis not present

## 2023-07-12 DIAGNOSIS — B379 Candidiasis, unspecified: Secondary | ICD-10-CM | POA: Diagnosis not present

## 2023-07-12 DIAGNOSIS — I2510 Atherosclerotic heart disease of native coronary artery without angina pectoris: Secondary | ICD-10-CM | POA: Diagnosis not present

## 2023-07-17 ENCOUNTER — Telehealth: Payer: Self-pay | Admitting: Cardiology

## 2023-07-17 DIAGNOSIS — I4892 Unspecified atrial flutter: Secondary | ICD-10-CM

## 2023-07-17 NOTE — Telephone Encounter (Signed)
  Patient said that when she was ready to wear her heart monitor on 04/05 and called [employer], she was told it needed to be worn on 04/04. She did not able to wear it on 04/04 since she got home late and was very exhausted. She apologized and she is requesting that a new heart monitor be ordered.

## 2023-07-17 NOTE — Telephone Encounter (Signed)
 Patient states she is so embarrassed she has not been able to complete heart monitor. She was informed by [employer] she had until 07/06/23 to place heart monitor and she was not able to do so until after that date.   She was instructed to call our office and request another heart monitor to be ordered for her. Dr. Marven Slimmer originally ordered heart monitor, she last saw him in office May 2023.  She reports still having some fluttering, but not constant. She would like to complete heart monitor.  Patient is scheduled to see Dr. Marven Slimmer 6/18.  Will forward to Dr. Candace Cerise nurse and Dr. Paulita Boss.

## 2023-07-18 ENCOUNTER — Ambulatory Visit: Attending: Internal Medicine

## 2023-07-18 DIAGNOSIS — I4892 Unspecified atrial flutter: Secondary | ICD-10-CM

## 2023-07-18 NOTE — Progress Notes (Unsigned)
 Enrolled for [employer] to mail a ZIO XT long term holter monitor to the patients address on file.

## 2023-07-18 NOTE — Telephone Encounter (Signed)
 Left message for patient advising that heart monitor has been ordered. Advised to call back with any questions.

## 2023-07-24 ENCOUNTER — Telehealth: Payer: Self-pay | Admitting: Neurology

## 2023-07-24 NOTE — Telephone Encounter (Signed)
 r/s appointment due to a conflict

## 2023-07-31 ENCOUNTER — Ambulatory Visit: Admitting: Cardiology

## 2023-08-15 ENCOUNTER — Institutional Professional Consult (permissible substitution): Admitting: Cardiovascular Disease

## 2023-08-15 ENCOUNTER — Other Ambulatory Visit: Payer: Self-pay | Admitting: Internal Medicine

## 2023-08-20 ENCOUNTER — Ambulatory Visit: Payer: Medicare Other | Admitting: Neurology

## 2023-09-11 ENCOUNTER — Other Ambulatory Visit: Payer: Self-pay | Admitting: *Deleted

## 2023-09-11 DIAGNOSIS — I4892 Unspecified atrial flutter: Secondary | ICD-10-CM

## 2023-09-11 MED ORDER — APIXABAN 5 MG PO TABS: 5.0000 mg | ORAL_TABLET | Freq: Two times a day (BID) | ORAL | 1 refills | Status: DC

## 2023-09-11 NOTE — Telephone Encounter (Signed)
 Eliquis  5mg  refill request received. Patient is 78 years old, weight-94.8kg, Crea-1.12 on 05/30/23, Diagnosis-Aflutter, and last seen by Dr. Paulita Boss on 05/27/23. Dose is appropriate based on dosing criteria. Will send in refill to requested pharmacy.

## 2023-09-13 DIAGNOSIS — M533 Sacrococcygeal disorders, not elsewhere classified: Secondary | ICD-10-CM | POA: Diagnosis not present

## 2023-09-13 DIAGNOSIS — G894 Chronic pain syndrome: Secondary | ICD-10-CM | POA: Diagnosis not present

## 2023-09-13 DIAGNOSIS — M48062 Spinal stenosis, lumbar region with neurogenic claudication: Secondary | ICD-10-CM | POA: Diagnosis not present

## 2023-09-13 DIAGNOSIS — Z79899 Other long term (current) drug therapy: Secondary | ICD-10-CM | POA: Diagnosis not present

## 2023-09-13 DIAGNOSIS — M47816 Spondylosis without myelopathy or radiculopathy, lumbar region: Secondary | ICD-10-CM | POA: Diagnosis not present

## 2023-09-15 NOTE — Progress Notes (Deleted)
  Electrophysiology Office Follow up Visit Note:    Date:  09/15/2023   ID:  Jennifer [name], DOB 10/27/1945, MRN [id number]  PCP:  Darnelle Elders, PA-C  [facility] Cardiologist:  Jann Melody, MD  [facility] Electrophysiologist:  Boyce Byes, MD    Interval History:     Jennifer [name] is a 78 y.o. female who presents for a follow up visit.   I last saw the patient Aug 30, 2021.  She has a history of atrial flutter, coronary artery disease, hyperlipidemia, chronic pain syndrome, sleep apnea not on CPAP.  At the time of our last appointment we planned for a 2-week ZIO monitor to assess the burden of arrhythmia.  She takes Eliquis  for stroke prophylaxis.        Past medical, surgical, social and family history were reviewed.  ROS:   Please see the history of present illness.    All other systems reviewed and are negative.  EKGs/Labs/Other Studies Reviewed:    The following studies were reviewed today:          Physical Exam:    VS:  There were no vitals taken for this visit.    Wt Readings from Last 3 Encounters:  05/30/23 209 lb (94.8 kg)  05/27/23 210 lb (95.3 kg)  04/30/23 206 lb 9.6 oz (93.7 kg)     GEN: no distress CARD: RRR, No MRG RESP: No IWOB. CTAB.      ASSESSMENT:    No diagnosis found. PLAN:    In order of problems listed above:     Signed, Harvie Liner, MD, [facility], [facility] 09/15/2023 3:22 PM    Electrophysiology [facility]

## 2023-09-17 ENCOUNTER — Other Ambulatory Visit: Payer: Self-pay

## 2023-09-17 MED ORDER — SPIRONOLACTONE 25 MG PO TABS: 25.0000 mg | ORAL_TABLET | Freq: Every day | ORAL | 2 refills | Status: AC

## 2023-09-18 ENCOUNTER — Ambulatory Visit: Admitting: Cardiology

## 2023-09-22 NOTE — Progress Notes (Deleted)
 Patient ID: Jennifer [name] MRN: [id number] DOB/AGE: [birth year] 76 y.o.  Primary Care Physician:Wharton, Charmaine, [place] Primary Cardiologist: Santo  CC:  Aortic valvular disease management     FOCUSED PROBLEM LIST:   Aortic stenosis AVA 0.89, MG 35, Vmax 3.7, EF 60-65%TTE 2024 EKG SR without conduction defects Atrial flutter On Eliquis  CAD 90% dLAD, 50%pLAD cath 2022 Hyperlipidemia Aortic atherosclerosis Coronary CTA 2022 Hypertension CKDIIIA Chronic debilitating back pain  6/26:   Patient consents to use of AI scribe. The patient is a 78F with the above listed medical problems referred for recommendations regarding her aortic stenosis.  She has been managed by Dr. Santo.  She underwent coronary angiography which demonstrated a high grade lesion of the distal LAD and given tortuosity of the vessel, medical management was pursued.  She did undergo CMR imaging and amyloid was excluded.  Her last echocardiogram demonstrated progression of her aortic stenosis.         Past Medical History:  Diagnosis Date   Acute meniscal tear of left knee    FOLLOWED BY DR GIAFFREY   Anemia    Aortic stenosis    Ascending aorta dilation (HCC)    Atrial flutter (HCC)    AV block, Mobitz 1    BPPV (benign paroxysmal positional vertigo)    CAD (coronary artery disease)    Chronic bladder pain    Chronic fatigue syndrome    Chronic low back pain    W/ RIGHT LEG PAIN AND NUMBNESS   Cystitis, chronic    Fibromyalgia    GERD (gastroesophageal reflux disease)    Hiatal hernia    Hypertension    IC (interstitial cystitis)    LBBB (left bundle branch block)    transient hx   OSA (obstructive sleep apnea)    per pt study yrs ago-- moderate osa ,  intolerant cpap   Pinched vertebral nerve    bilateral L2 -- L3 and L3 -- L4-/  epidural injection's treatment , PT and pain clince   PONV (postoperative nausea and vomiting)    severe   PVC's (premature ventricular  contractions)    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    Self-catheterizes urinary bladder    Spinal stenosis, lumbar region with neurogenic claudication    Wears glasses    Wears partial dentures    upper and lower    Past Surgical History:  Procedure Laterality Date   ABDOMINAL AORTOGRAM N/A 09/16/2020   Procedure: ABDOMINAL AORTOGRAM;  Surgeon: Darron Deatrice LABOR, MD;  Location: [facility];  Service: Cardiovascular;  Laterality: N/A;   ABDOMINAL HYSTERECTOMY  1980   w/ Left Salpingoophorectomy   BIOPSY  08/27/2021   Procedure: BIOPSY;  Surgeon: Rosalie Kitchens, MD;  Location: [facility] ENDOSCOPY;  Service: Gastroenterology;;   CARDIAC CATHETERIZATION  08-21-2001  dr victory sharps   normal coronary arteries and LVF   CARPAL TUNNEL RELEASE Bilateral 1986   CECALCYSTOPLASTY/  APPENDECTOMY  1984   at [facility]   CHOLECYSTECTOMY  1992   CYSTO WITH HYDRODISTENSION N/A 01/12/2016   Procedure: CYSTOSCOPY/HYDRODISTENSION;  Surgeon: Arlena Gal, MD;  Location: [facility];  Service: Urology;  Laterality: N/A;   CYSTO WITH HYDRODISTENSION N/A 08/30/2016   Procedure: CYSTOSCOPY/HYDRODISTENSION OF BLADDER INJECTION OF MARCAINE /PYRIDIUM ;  Surgeon: Gal Arlena, MD;  Location: [facility];  Service: Urology;  Laterality: N/A;   ESOPHAGOGASTRODUODENOSCOPY (EGD) WITH PROPOFOL  N/A 08/27/2021   Procedure: ESOPHAGOGASTRODUODENOSCOPY (EGD) WITH  PROPOFOL ;  Surgeon: Rosalie Kitchens, MD;  Location: [facility] ENDOSCOPY;  Service: Gastroenterology;  Laterality: N/A;   LEFT HEART CATH AND CORONARY ANGIOGRAPHY N/A 09/16/2020   Procedure: LEFT HEART CATH AND CORONARY ANGIOGRAPHY;  Surgeon: Darron Deatrice LABOR, MD;  Location: [facility];  Service: Cardiovascular;  Laterality: N/A;   MULTIPLE CYSTO/  HYDRODISTENTION/  INSTILLATION THERAPY  last one 08-19-2009   NASAL SINUS SURGERY  1987   TONSILLECTOMY  as child   VAGINAL GROWTH REMOVED  as teen     Family History  Problem Relation Age of Onset   CAD Father    Asthma Father    Hypertension Father    Alzheimer's disease Father    Emphysema Father    COPD Father    Heart failure Mother    Peripheral vascular disease Mother    Stroke Mother    Hypertension Mother    Colonic polyp Mother    Cancer Brother        melanoma    Social History   Socioeconomic History   Marital status: Single    Spouse name: none   Number of children: 0   Years of education: College   Highest education level: Not on file  Occupational History   Occupation: n/a  Tobacco Use   Smoking status: Never   Smokeless tobacco: Never  Vaping Use   Vaping status: Never Used  Substance and Sexual Activity   Alcohol use: No    Alcohol/week: 0.0 standard drinks of alcohol   Drug use: No   Sexual activity: Not on file  Other Topics Concern   Not on file  Social History Narrative   Lives alone   Alyse Luke Kerns, RN    Caffeine use: 2 glasses tea/day   Brother   Mother passed in ?21 at 64, father passed in 4's in 2006   She was a Runner, broadcasting/film/video      Social Drivers of Corporate investment banker Strain: Low Risk  (03/22/2022)   Overall Financial Resource Strain (CARDIA)    Difficulty of Paying Living Expenses: Not very hard  Food Insecurity: No Food Insecurity (07/19/2022)   Hunger Vital Sign    Worried About Running Out of Food in the Last Year: Never true    Ran Out of Food in the Last Year: Never true  Transportation Needs: No Transportation Needs (07/19/2022)   PRAPARE - Administrator, Civil Service (Medical): No    Lack of Transportation (Non-Medical): No  Physical Activity: Not on file  Stress: No Stress Concern Present (03/22/2022)   [employer] of Occupational Health - Occupational Stress Questionnaire    Feeling of Stress : Only a little  Social Connections: Moderately Integrated (05/15/2021)   Social Connection and Isolation Panel    Frequency of Communication  with Friends and Family: Twice a week    Frequency of Social Gatherings with Friends and Family: Twice a week    Attends Religious Services: 1 to 4 times per year    Active Member of [employer] or Organizations: Yes    Attends Banker Meetings: 1 to 4 times per year    Marital Status: Never married  Intimate Partner Violence: Not At Risk (05/15/2021)   Humiliation, Afraid, Rape, and Kick questionnaire    Fear of Current or Ex-Partner: No    Emotionally Abused: No    Physically Abused: No    Sexually Abused: No     Prior to Admission medications  Medication Sig Start Date End Date Taking? Authorizing Provider  acetaminophen  (TYLENOL ) 500 MG tablet Take 500 mg by mouth 2 (two) times daily as needed (for pain).    [provider]  aluminum hydroxide-magnesium  carbonate (GAVISCON) 95-358 MG/15ML SUSP Take by mouth as needed for indigestion or heartburn. 01/23/17   [provider]  apixaban  (ELIQUIS ) 5 MG TABS tablet Take 1 tablet (5 mg total) by mouth 2 (two) times daily. 09/11/23   Santo Stanly LABOR, MD  cyanocobalamin  (VITAMIN B12) 1000 MCG tablet Take 1,000 mcg by mouth daily. 07/12/22   [provider]  dextromethorphan -guaiFENesin  (MUCINEX  DM) 30-600 MG 12hr tablet Take 1 tablet by mouth every 12 (twelve) hours as needed for cough.    [provider]  ezetimibe  (ZETIA ) 10 MG tablet Take 1 tablet (10 mg total) by mouth daily. 06/10/23   Chandrasekhar, Stanly LABOR, MD  fexofenadine (ALLEGRA) 60 MG tablet Take 60 mg by mouth daily as needed for allergies. 01/06/14   [provider]  fluconazole  (DIFLUCAN ) 100 MG tablet Take 100 mg by mouth daily. 05/14/23   [provider]  furosemide  (LASIX ) 40 MG tablet May take an extra dose once daily as needed for swelling 05/27/23   Chandrasekhar, Mahesh A, MD  gabapentin  (NEURONTIN ) 300 MG capsule Take 300 mg by mouth at bedtime. 07/28/21   [provider]  hydrALAZINE  (APRESOLINE ) 50  MG tablet TAKE 1 TABLET BY MOUTH THREE TIMES A DAY 06/07/23   Chandrasekhar, Mahesh A, MD  HYDROcodone -acetaminophen  (NORCO) 7.5-325 MG tablet Take 1 tablet by mouth 3 (three) times daily as needed.    [provider]  hydrocortisone  (ANUSOL -HC) 25 MG suppository Place 25 mg rectally 2 (two) times daily as needed for hemorrhoids. 12/07/20   [provider]  isosorbide  dinitrate (ISORDIL ) 10 MG tablet TAKE 1 TABLET BY MOUTH THREE TIMES A DAY 06/07/23   Chandrasekhar, Mahesh A, MD  losartan  (COZAAR ) 100 MG tablet TAKE 1 TABLET BY MOUTH EVERY DAY 08/15/23   Chandrasekhar, Mahesh A, MD  meclizine  (ANTIVERT ) 25 MG tablet Take 25 mg by mouth 3 (three) times daily as needed for dizziness.    [provider]  Methen-Hyosc-Meth Blue-Na Phos (UROGESIC-BLUE) 81.6 MG TABS Take 1 tablet by mouth every 6 (six) hours as needed (urinary burning). 08/21/21   [provider]  metoprolol  tartrate (LOPRESSOR ) 25 MG tablet TAKE 1 TABLET BY MOUTH TWICE A DAY 08/15/23   Chandrasekhar, Mahesh A, MD  nitroGLYCERIN  (NITROSTAT ) 0.4 MG SL tablet PLACE 1 TABLET UNDER THE TONGE EVERY 5 MINUTES AS NEEDED FOR CHEST PAIN *TAKE UP TO 3 TABLETS* 02/21/23   Chandrasekhar, Mahesh A, MD  nystatin  cream (MYCOSTATIN ) Apply topically 2 (two) times daily. 08/04/20   Eben Reyes BROCKS, MD  ondansetron  (ZOFRAN -ODT) 4 MG disintegrating tablet Take 4 mg by mouth every 8 (eight) hours as needed for nausea (dissolve orally). 07/01/17   [provider]  pantoprazole  (PROTONIX ) 40 MG tablet Take 40 mg by mouth 2 (two) times daily. 04/15/23   [provider]  polyethylene glycol powder (GLYCOLAX/MIRALAX) 17 GM/SCOOP powder Take 17 g by mouth daily as needed for mild constipation.    [provider]  rosuvastatin  (CRESTOR ) 5 MG tablet Take 1 tablet (5 mg total) by mouth 3 (three) times a week. 08/03/22 05/27/23  Santo Stanly LABOR, MD  senna (SENOKOT) 8.6 MG tablet Take 1 tablet by mouth as needed for  constipation. 07/28/21   [provider]  spironolactone  (ALDACTONE ) 25 MG tablet Take  1 tablet (25 mg total) by mouth daily. 09/17/23   Chandrasekhar, Stanly LABOR, MD  sucralfate  (CARAFATE ) 1 g tablet Take 1 tablet (1 g total) by mouth 4 (four) times daily -  with meals and at bedtime. Patient taking differently: Take 1 g by mouth daily at 6 (six) AM. 1 tab in am, 1 tab in pm 08/28/21   Patsy Lenis, MD  triamcinolone  cream (KENALOG ) 0.1 % Apply 1 application. topically 2 (two) times daily as needed (rash). 04/06/21   [provider]    Allergies  Allergen Reactions   Moxifloxacin Hives, Shortness Of Breath, Swelling and Rash    Rash was severe, caused tremors, ans skin became BLOODY RED   Quinolones Anaphylaxis   Sulfa Antibiotics Hives and Rash   Furosemide  Other (See Comments)    CANNOT TAKE DUE TO Interstitial cystitis   Aspirin  Nausea Only and Other (See Comments)    GI and stomach upset   Doxazosin      constipation   Duloxetine     Other reaction(s): Other (See Comments)   Duloxetine Hcl Nausea Only and Other (See Comments)    Vertigo and heart racing also    Hydrochlorothiazide Other (See Comments)    Headaches    Ibuprofen Nausea Only and Other (See Comments)    GI upset   Nebivolol  Hcl     Other reaction(s): extreme fatigue   Other Other (See Comments)   Ranexa  [Ranolazine ]     Constipation/heart pounding   Robaxin [Methocarbamol] Other (See Comments)    Make me feel flu-like symptoms   Topiramate Itching   Xarelto  [Rivaroxaban ]     Back pain   Zofran  [Ondansetron  Hcl] Other (See Comments)    Headaches    Codeine Hives, Nausea And Vomiting and Rash   Dimethyl Sulfoxide Hives and Itching   Macrodantin  [Nitrofurantoin  Macrocrystal] Itching, Nausea And Vomiting and Rash    Abdominal and chest pain, also- blood-red skin, also   Nitrofurantoin  Diarrhea, Itching, Rash and Other (See Comments)    GI/stomach upset, sweating, chest pain, and skin turned  bloody red   Tape Rash   Yellow Dye Rash    REVIEW OF SYSTEMS:  General: no fevers/chills/night sweats Eyes: no blurry vision, diplopia, or amaurosis ENT: no sore throat or hearing loss Resp: no cough, wheezing, or hemoptysis CV: no edema or palpitations GI: no abdominal pain, nausea, vomiting, diarrhea, or constipation GU: no dysuria, frequency, or hematuria Skin: no rash Neuro: no headache, numbness, tingling, or weakness of extremities Musculoskeletal: no joint pain or swelling Heme: no bleeding, DVT, or easy bruising Endo: no polydipsia or polyuria  There were no vitals taken for this visit.  PHYSICAL EXAM: GEN:  AO x 3 in no acute distress HEENT: normal Dentition: Normal*** Neck: JVP normal. +2***carotid upstrokes without bruits. No thyromegaly. Lungs: equal expansion, clear bilaterally CV: Apex is discrete and nondisplaced, RRR without murmur or gallop*** Abd: soft, non-tender, non-distended; no bruit; positive bowel sounds Ext: no edema, ecchymoses, or cyanosis Vascular: 2+ femoral pulses, 2+ radial pulses       Skin: warm and dry without rash Neuro: CN II-XII grossly intact; motor and sensory grossly intact    DATA AND STUDIES:  EKG:2025 EKG SR without conduction defects  EKG Interpretation Date/Time:    Ventricular Rate:    PR Interval:    QRS Duration:    QT Interval:    QTC Calculation:   R Axis:      Text Interpretation:  Cardiac Studies & Procedures   ______________________________________________________________________________________________ CARDIAC CATHETERIZATION  CARDIAC CATHETERIZATION 09/16/2020  Conclusion  Mid LAD lesion is 50% stenosed.  3rd Diag lesion is 60% stenosed.  Dist LAD-1 lesion is 90% stenosed.  Dist LAD-2 lesion is 40% stenosed.  1.  Significant one-vessel coronary artery disease involving the mid to distal LAD at the bifurcation of the third diagonal which has also 60% ostial stenosis.  The coronary  arteries are very tortuous suggestive of hypertensive heart disease. 2.  Left ventricular angiography was not performed.  EF was normal by echo. 3.  Severely elevated blood pressure with mildly elevated left ventricular end-diastolic pressure. 4.  Transient left bundle branch block after engaging the left ventricle.  This resolved by the end of the case. 5.  Abdominal aortogram was performed and showed no evidence of renal artery stenosis.  Recommendations: Unfortunately, the LAD stenosis is in a very tortuous segment and at the origin of the third diagonal branch which also has ostial disease.  PCI in this area will be high risk for dissection as well as sidebranch closure especially in the setting of uncontrolled hypertension.  In addition, the distal LAD after the stenosis is diffusely diseased. I recommend maximizing medical therapy and controlling blood pressure before considering PCI.  I added amlodipine  to her antihypertensive medications. Other complicating issue is that the patient cannot tolerate aspirin  long-term.  If we decide to proceed with PCI at some point, we will have to make sure she tolerates ticagrelor which can likely be used as monotherapy.  Findings Coronary Findings Diagnostic  Dominance: Right  Left Main Vessel is angiographically normal.  Left Anterior Descending Mid LAD lesion is 50% stenosed. Dist LAD-1 lesion is 90% stenosed. The lesion is mildly calcified. Dist LAD-2 lesion is 40% stenosed.  Third Diagonal Branch 3rd Diag lesion is 60% stenosed.  Left Circumflex The vessel exhibits minimal luminal irregularities.  Right Coronary Artery Vessel is large. Vessel is angiographically normal.  Intervention  No interventions have been documented.   STRESS TESTS  MYOCARDIAL PERFUSION IMAGING 01/12/2022  Narrative   The study is normal. The study is low risk.   No ST deviation was noted.   LV perfusion is normal. There is no evidence of ischemia.  There is no evidence of infarction.   Left ventricular function is normal. Nuclear stress EF: 74 %. The left ventricular ejection fraction is hyperdynamic (>65%). End diastolic cavity size is mildly enlarged. End systolic cavity size is normal.   Prior study not available for comparison.   ECHOCARDIOGRAM  ECHOCARDIOGRAM COMPLETE 02/14/2023  Narrative ECHOCARDIOGRAM REPORT    Patient Name:   Jennifer [name] Date of Exam: 02/14/2023 Medical Rec #:  [id number]        Height:       63.0 in Accession #:    [id number]       Weight:       208.3 lb Date of Birth:  11/03/1945        BSA:          1.967 m Patient Age:    77 years         BP:           200/90 mmHg Patient Gender: F                HR:           55 bpm. Exam Location:  [address]  Procedure: 2D Echo, 3D Echo, Cardiac Doppler, Color Doppler and Strain  Analysis  Indications:    I35.9 Aortic Valve Disease  History:        Patient has prior history of Echocardiogram examinations, most recent 01/12/2022. CAD, Arrythmias:Atrial Flutter, LBBB and PVC; Risk Factors:Sleep Apnea.  Sonographer:    Waldo Guadalajara RCS Referring Phys: [id number] MAHESH A CHANDRASEKHAR  IMPRESSIONS   1. Left ventricular ejection fraction, by estimation, is 60 to 65%. The left ventricle has normal function. The left ventricle has no regional wall motion abnormalities. There is mild concentric left ventricular hypertrophy. Left ventricular diastolic parameters are consistent with Grade I diastolic dysfunction (impaired relaxation). GLS -20.8% 2. Right ventricular systolic function is normal. The right ventricular size is normal. There is mildly elevated pulmonary artery systolic pressure. 3. Left atrial size was mildly dilated. 4. The mitral valve is normal in structure. Mild mitral valve regurgitation. No evidence of mitral stenosis. 5. The aortic valve is severely calcified with restricted excursion of valve leaflets and flow acceleration across the  valve. There is atleast moderate aortic stenosis with a mean PG of and AVA by VTI of 0.89cm2. There is severe AS by 2D planimetry. 6. Aortic dilatation noted. There is dilatation of the ascending aorta, measuring 41 mm. 7. The inferior vena cava is normal in size with greater than 50% respiratory variability, suggesting right atrial pressure of 3 mmHg.  FINDINGS Left Ventricle: Left ventricular ejection fraction, by estimation, is 60 to 65%. The left ventricle has normal function. The left ventricle has no regional wall motion abnormalities. The left ventricular internal cavity size was normal in size. There is mild concentric left ventricular hypertrophy. Left ventricular diastolic parameters are consistent with Grade I diastolic dysfunction (impaired relaxation).  Right Ventricle: The right ventricular size is normal. No increase in right ventricular wall thickness. Right ventricular systolic function is normal. There is mildly elevated pulmonary artery systolic pressure. The tricuspid regurgitant velocity is 2.95 m/s, and with an assumed right atrial pressure of 3 mmHg, the estimated right ventricular systolic pressure is 37.8 mmHg.  Left Atrium: Left atrial size was mildly dilated.  Right Atrium: Right atrial size was normal in size.  Pericardium: There is no evidence of pericardial effusion.  Mitral Valve: The mitral valve is normal in structure. Mild mitral valve regurgitation. No evidence of mitral valve stenosis.  Tricuspid Valve: The tricuspid valve is normal in structure. Tricuspid valve regurgitation is not demonstrated. No evidence of tricuspid stenosis.  Aortic Valve: The aortic valve is abnormal. Aortic valve regurgitation is not visualized. Severe aortic stenosis is present. Aortic valve mean gradient measures 35.0 mmHg. Aortic valve peak gradient measures 56.6 mmHg. Aortic valve area, by VTI measures 0.89 cm.  Pulmonic Valve: The pulmonic valve was normal in  structure. Pulmonic valve regurgitation is trivial. No evidence of pulmonic stenosis.  Aorta: The aortic root is normal in size and structure and aortic dilatation noted. There is dilatation of the ascending aorta, measuring 41 mm.  Venous: The inferior vena cava is normal in size with greater than 50% respiratory variability, suggesting right atrial pressure of 3 mmHg.  IAS/Shunts: No atrial level shunt detected by color flow Doppler.   LEFT VENTRICLE PLAX 2D LVIDd:         5.20 cm   Diastology LVIDs:         2.40 cm   LV e' medial:    5.87 cm/s LV PW:         1.20 cm   LV E/e' medial:  18.7 LV IVS:  1.20 cm   LV e' lateral:   11.40 cm/s LVOT diam:     1.90 cm   LV E/e' lateral: 9.6 LV SV:         91 LV SV Index:   46        2D Longitudinal Strain LVOT Area:     2.84 cm  2D Strain GLS (A2C):   -16.9 % 2D Strain GLS (A3C):   -25.0 % 2D Strain GLS (A4C):   -20.4 % 2D Strain GLS Avg:     -20.8 %  3D Volume EF: 3D EF:        75 % LV EDV:       130 ml LV ESV:       33 ml LV SV:        98 ml  RIGHT VENTRICLE RV Basal diam:  3.60 cm RV S prime:     15.40 cm/s TAPSE (M-mode): 3.5 cm RVSP:           37.8 mmHg  LEFT ATRIUM             Index        RIGHT ATRIUM           Index LA diam:        4.60 cm 2.34 cm/m   RA Pressure: 3.00 mmHg LA Vol (A2C):   68.0 ml 34.56 ml/m  RA Area:     11.30 cm LA Vol (A4C):   74.6 ml 37.92 ml/m  RA Volume:   19.60 ml  9.96 ml/m LA Biplane Vol: 72.3 ml 36.75 ml/m AORTIC VALVE AV Area (Vmax):    0.85 cm AV Area (Vmean):   0.91 cm AV Area (VTI):     0.89 cm AV Vmax:           376.00 cm/s AV Vmean:          284.000 cm/s AV VTI:            1.030 m AV Peak Grad:      56.6 mmHg AV Mean Grad:      35.0 mmHg LVOT Vmax:         113.00 cm/s LVOT Vmean:        90.700 cm/s LVOT VTI:          0.322 m LVOT/AV VTI ratio: 0.31  AORTA Ao Root diam: 3.00 cm Ao Asc diam:  4.10 cm  MITRAL VALVE                TRICUSPID VALVE MV Area (PHT):               TR Peak grad:   34.8 mmHg MV Decel Time:              TR Vmax:        295.00 cm/s MV E velocity: 110.00 cm/s  Estimated RAP:  3.00 mmHg MV A velocity: 116.00 cm/s  RVSP:           37.8 mmHg MV E/A ratio:  0.95 SHUNTS Systemic VTI:  0.32 m Systemic Diam: 1.90 cm  Aditya Sabharwal Electronically signed by Ria Commander Signature Date/Time: 02/14/2023/6:42:26 PM    Final      CT SCANS  CT CORONARY MORPH W/CTA COR W/SCORE 09/15/2020  Addendum 09/15/2020  6:28 PM ADDENDUM REPORT: 09/15/2020 18:26  CLINICAL DATA:  78 Year-old Caucasian Female  EXAM: Cardiac/Coronary  CTA  TECHNIQUE: The patient was scanned on a [employer].  FINDINGS: A  100 kV prospective scan was triggered in the descending thoracic aorta at 111 HU's. Axial non-contrast 3 mm slices were carried out through the heart. The data set was analyzed on a dedicated work station and scored using the Agatson method. Gantry rotation speed was 250 msecs and collimation was .6 mm. No beta blockade and 0.8 mg of sl NTG was given. The 3D data set was reconstructed in 5% intervals of the 67-82 % of the R-R cycle. Diastolic phases were analyzed on a dedicated work station using MPR, MIP and VRT modes. The patient received 100 cc of contrast.  Aorta: Ascending aorta is mildly dilated for age and body surface area: 40 mm. No calcifications in cardiac field of view. No dissection.  Main Pulmonary Artery: Mild dilation of the pulmonary artery: 32 mm with artery to aorta ratio of < 0.9.  Aortic Valve:  Tri-leaflet.  Mild annular calcification.  Coronary Arteries:  Normal coronary origin.  Right dominance.  Coronary Calcium  Score:  Left main: 0  Left anterior descending artery: 156  Left circumflex artery: 0  Right coronary artery: 1  Total: 157  Percentile: 74th for age, sex, and race matched control.  RCA is a large dominant artery that gives rise to PDA and PLA. There is no  plaque.  Left main is a large artery that gives rise to LAD and LCX arteries. There is no plaque.  LAD is a large vessel that gives rise to one small D1 Branch. There is a mild non obstructive stenosis (25-49%) calcific plaque in the proximal LAD. There is a moderate stenosis (50-70%) calcific plaque in the mid LAD. There is a moderate stenosis (50-70%) calcific plaque in the distal LAD.  LCX is a non-dominant artery.  There is no plaque.  There is a ramus intermedius vessel with minimal non-obstructive (1-24%) soft plaque in the mid vessel.  Other findings:  Normal pulmonary vein drainage into the left atrium.  Normal left atrial appendage without a thrombus.  Cannot exclude small patent foramen ovale.  Extra-cardiac findings: See attached radiology report for non-cardiac structures.  IMPRESSION: 1. Coronary calcium  score of 157. This was 74th percentile for age, sex, and race matched control.  2. Normal coronary origin with right dominance.  3. CAD-RADS 3. Moderate stenosis. Consider symptom-guided anti-ischemic pharmacotherapy as well as risk factor modification per guideline directed care. Additional analysis with CT FFR will be submitted.  4. Ascending aorta is mildly dilation for age and body surface area: 40 mm.  5. Mild dilation of the pulmonary artery.  6. Cannot exclude small patent foramen ovale.  Reaching out to primary team.  RECOMMENDATIONS:  Coronary artery calcium  (CAC) score is a strong predictor of incident coronary heart disease (CHD) and provides predictive information beyond traditional risk factors. CAC scoring is reasonable to use in the decision to withhold, postpone, or initiate statin therapy in intermediate-risk or selected borderline-risk asymptomatic adults (age 69-75 years and LDL-C >=70 to <190 mg/dL) who do not have diabetes or established atherosclerotic cardiovascular disease (ASCVD).* In intermediate-risk (10-year ASCVD risk  >=7.5% to <20%) adults or selected borderline-risk (10-year ASCVD risk >=5% to <7.5%) adults in whom a CAC score is measured for the purpose of making a treatment decision the following recommendations have been made:  If CAC = 0, it is reasonable to withhold statin therapy and reassess in 5 to 10 years, as long as higher risk conditions are absent (diabetes mellitus, family history of premature CHD in first degree relatives (males <55 years;  females <65 years), cigarette smoking, LDL >=190 mg/dL or other independent risk factors).  If CAC is 1 to 99, it is reasonable to initiate statin therapy for patients ?76 years of age.  If CAC is >=100 or >=75th percentile, it is reasonable to initiate statin therapy at any age.  Cardiology referral should be considered for patients with CAC scores =400 or >=75th percentile.  *2018 AHA/ACC/AACVPR/AAPA/ABC/ACPM/ADA/AGS/APhA/ASPC/NLA/PCNA Guideline on the Management of Blood Cholesterol: A Report of the American College of Cardiology/American Heart Association Task Force on Clinical Practice Guidelines. J Am Coll Cardiol. 2019;73(24):3168-3209.  Stanly Leavens, MD   Electronically Signed By: Stanly Leavens MD On: 09/15/2020 18:26  Narrative EXAM: OVER-READ INTERPRETATION  CT CHEST  The following report is an over-read performed by radiologist Dr. Rockey Kilts of [facility] on 09/15/2020. This over-read does not include interpretation of cardiac or coronary anatomy or pathology. The coronary CTA interpretation by the cardiologist is attached.  COMPARISON:  Chest radiograph of earlier today.  FINDINGS: Vascular: Upper normal ascending aortic caliber, including 3.9 cm on 09/10. Aortic atherosclerosis. No central pulmonary embolism, on this non-dedicated study.  Mediastinum/Nodes: No imaged thoracic adenopathy. Tiny hiatal hernia.  Lungs/Pleura: No pleural fluid.  Clear imaged lungs.  Upper Abdomen:  Moderate hepatic steatosis. Normal imaged portions of the spleen.  Musculoskeletal: Mild-to-moderate convex right lower thoracic spinal curvature.  IMPRESSION: 1. No acute findings in the imaged extracardiac chest. 2. Hepatic steatosis. 3. Tiny hiatal hernia. 4. Aortic Atherosclerosis (ICD10-I70.0).  Electronically Signed: By: Rockey Kilts M.D. On: 09/15/2020 15:51   CT SCANS  CT CORONARY FRACTIONAL FLOW RESERVE DATA PREP 09/16/2020  Narrative EXAM: CT-FFR ANALYSIS  CLINICAL DATA:  Possibly obstructive coronary lesion in LAD  FINDINGS: CT-FFR analysis was performed on the original cardiac CT angiogram dataset. Diagrammatic representation of the CT-FFR analysis is provided in a separate PDF document in PACS. This dictation was created using the PDF document and an interactive 3D model of the results. 3D model is not available in the EMR/PACS. Normal FFR range is >0.80.  1. Left Main: No significant functional stenosis, CT-FFR 0.99.  2. LAD: Significant functional stenosis in mid-distal LAD, CT-FFR 0.67.  3. LCX: No significant functional stenosis, CT-FFR 0.92.  4. RCA: No significant functional stenosis, CT-FFR 0.96.  5. Ramus: No significant functional stenosis, CT-FFR 0.89.  : 1. CT FFR analysis shows evidence of significant functional stenosis in the LAD.  Stanly Leavens MD   Electronically Signed By: Stanly Leavens MD On: 09/16/2020 11:12   CARDIAC MRI  MR CARDIAC MORPHOLOGY W WO CONTRAST 07/25/2022  Narrative CLINICAL DATA:  12F with PYP scan suggestive of amyloid. Echo with EF 65-70%, moderate LVH, moderate AS  EXAM: CARDIAC MRI  TECHNIQUE: The patient was scanned on a 1.5 Tesla Siemens magnet. A dedicated cardiac coil was used. Functional imaging was done using Fiesta sequences. 2,3, and 4 chamber views were done to assess for RWMA's. Modified Simpson's rule using a short axis stack was used to calculate an ejection fraction  on a dedicated work [employer]. The patient received 12 cc of Gadavist . After 10 minutes inversion recovery sequences were used to assess for infiltration and scar tissue. Phase contrast velocity mapping was performed above the aortic and pulmonic valves  CONTRAST:  12 cc  of Gadavist   FINDINGS: Left ventricle:  -Moderate asymmetric hypertrophy measuring up to 14mm in basal septum (8mm in posterior wall)  -Normal size  -Hyperdynamic systolic function  -Mild ECV elevation (  29%)  -RV insertion site LGE  LV EF:  73% (Normal 52-79%)  Absolute volumes:  LV EDV: (Normal 78-167 mL)  LV ESV: 48mL (Normal 21-64 mL)  LV SV: (Normal 52-114 mL)  CO: 7.0L/min (Normal 2.7-6.3 L/min)  Indexed volumes:  LV EDV: 56mL/sq-m (Normal 50-96 mL/sq-m)  LV ESV: 61mL/sq-m (Normal 10-40 mL/sq-m)  LV SV: 71mL/sq-m (Normal 33-64 mL/sq-m)  CI: 3.5L/min/sq-m (Normal 1.9-3.9 L/min/sq-m)  Right ventricle: Normal size and systolic function  RV EF: 57% (Normal 52-80%)  Absolute volumes:  RV EDV: (Normal 79-175 mL)  RV ESV: 75mL (Normal 13-75 mL)  RV SV: (Normal 56-110 mL)  CO: 5.6L/min (Normal 2.7-6 L/min)  Indexed volumes:  RV EDV: 35mL/sq-m (Normal 51-97 mL/sq-m)  RV ESV: 21mL/sq-m (Normal 9-42 mL/sq-m)  RV SV: 27mL/sq-m (Normal 35-61 mL/sq-m)  CI: 2.7L/min/sq-m (Normal 1.8-3.8 L/min/sq-m)  Left atrium: Mild enlargement  Right atrium: Normal size  Mitral valve: Trivial regurgitation  Aortic valve: No regurgitation  Tricuspid valve: Trivial regurgitation  Pulmonic valve: No regurgitation  Aorta: Dilated ascending aorta measuring 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  No evidence of cardiac amyloidosis  2. Moderate asymmetric LV hypertrophy measuring up to 14mm in basal septum (8mm in posterior wall), not meeting criteria for hypertrophic cardiomyopathy  (<29mm)  3.  Normal LV size with hyperdynamic systolic function (EF 73%)  4.  Normal RV size and systolic function (EF 57%)  5. RV insertion site LGE, which is a nonspecific scar pattern often seen in setting of elevated pulmonary pressures  6.  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  Dilated main pulmonary artery measuring 31mm   Electronically Signed By: Lonni Nanas M.D. On: 07/26/2022 22:10  PYP SCAN  MYOCARDIAL AMYLOID PLANAR AND SPECT 03/28/2022  Narrative   By semi-quantitative assessment scan is consistent with heart uptake equal to rib uptake-Grade 2. Heart to contralateral lung ratio is between 1-1.5, indeterminate for amyloid.   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   Prior study not available for comparison.  Conclusion  2) Strongly suggestive of TTR amyloidosis or (Strongly suggestive: A semi-quantitative visual score of 2 or 3 or H/CL ratio >1.5)  **If echo/CMR are strongly positive, and negative, consider further evaluation including endomyocardial biopsy  (Note: A negative or mildly positive PYP does not exclude AL amyloid. In addition, equivocal results could represent AL amyloid or early TTR amyloid)  36mTechnetium-Pyrophosphate Imaging  for Transthyretin Cardiac Amyloidosis - [facility] Practice Points, CANDIE Blackbird, et. Al.  ______________________________________________________________________________________________      05/30/2023: ALT 11; BUN 27; Creatinine 1.12; Hemoglobin 10.8; Platelet Count 285; Potassium 4.5; Sodium 140   STS RISK CALCULATOR: Pending  NHYA CLASS: ***    ASSESSMENT AND PLAN:   1. Nonrheumatic aortic valve stenosis   2. Coronary artery disease of native artery of native heart with stable angina pectoris (HCC)   3. Hyperlipidemia LDL goal <70   4. Aortic atherosclerosis (HCC)   5. Atrial flutter, unspecified type (HCC)   6. Secondary hypercoagulable state (HCC)    7. Primary hypertension   8. Stage 3a chronic kidney disease (HCC)     AS:*** CAD:  Continue Eliquis  5mg  BID, Zetia  10mg , lopressor  25mg  BID, PRN NTG. Hyperlipidemia:  Continue Zetia  10mg , consider statin. Aortic atherosclerosis:  Continue Eliquis  5mg  BID and Zetia  10mg . Atrial flutter:  Continue Eliquis  5mg , lopressor  25mg  BID. Secondary hypercoagulable state:  Continue Eliquis  5mg  BID.  Hypertension:  Continue hydralazine  50mg  TID, isordil  10mg  TID, losartan  100mg  daily, lopressor  25mg  BID. CKDIIIA:  Continue losartan  100mg  daily.   I have personally reviewed the patients imaging data as summarized above.  I have reviewed the natural history of aortic stenosis with the patient and family members who are present today. We have discussed the limitations of medical therapy and the poor prognosis associated with symptomatic aortic stenosis. We have also reviewed potential treatment options, including palliative medical therapy, conventional surgical aortic valve replacement, and transcatheter aortic valve replacement. We discussed treatment options in the context of this patient's specific comorbid medical conditions.   All of the patient's questions were answered today. Will make further recommendations based on the results of studies outlined above.   I spent *** minutes reviewing all clinical data during and prior to this visit including all relevant imaging studies, laboratories, clinical information from other health systems and prior notes from both Cardiology and other specialties, interviewing the patient, conducting a complete physical examination, and coordinating care in order to formulate a comprehensive and personalized evaluation and treatment plan.   Fleeta Kunde K Owynn Mosqueda, MD  09/22/2023 10:37 AM    [facility] [address] [place], [place], [place]  [address] Phone: [phone]; Fax: [phone]

## 2023-09-24 ENCOUNTER — Telehealth: Payer: Self-pay

## 2023-09-24 NOTE — Telephone Encounter (Signed)
 The patient called to reschedule her 09/26/2023 TAVR consult with Dr. Wendel to a later date. She is having back issues and scheduled an appointment with her MD and the only time they could do it is the same time on 6/26. She is also awaiting an MRI to assess her back.  Rescheduled the patient for TAVR consult with Dr.Thukkani to 10/29/2023. She was grateful for call and agreed with plan.

## 2023-09-26 ENCOUNTER — Ambulatory Visit: Admitting: Internal Medicine

## 2023-09-26 DIAGNOSIS — I350 Nonrheumatic aortic (valve) stenosis: Secondary | ICD-10-CM

## 2023-09-26 DIAGNOSIS — I10 Essential (primary) hypertension: Secondary | ICD-10-CM

## 2023-09-26 DIAGNOSIS — I25118 Atherosclerotic heart disease of native coronary artery with other forms of angina pectoris: Secondary | ICD-10-CM

## 2023-09-26 DIAGNOSIS — I700 Atherosclerosis of aorta: Secondary | ICD-10-CM

## 2023-09-26 DIAGNOSIS — N1831 Chronic kidney disease, stage 3a: Secondary | ICD-10-CM

## 2023-09-26 DIAGNOSIS — E785 Hyperlipidemia, unspecified: Secondary | ICD-10-CM

## 2023-09-26 DIAGNOSIS — D6869 Other thrombophilia: Secondary | ICD-10-CM

## 2023-09-26 DIAGNOSIS — I4892 Unspecified atrial flutter: Secondary | ICD-10-CM

## 2023-10-10 ENCOUNTER — Institutional Professional Consult (permissible substitution): Admitting: Cardiovascular Disease

## 2023-10-21 ENCOUNTER — Institutional Professional Consult (permissible substitution): Admitting: Internal Medicine

## 2023-10-23 NOTE — Progress Notes (Deleted)
 [name] ID: Jennifer [name] MRN: [id number] DOB/AGE: [birth year] 78 y.o.  Primary Care Physician:Wharton, Charmaine, [place] Primary Cardiologist: Santo  CC:  Aortic valvular disease management    FOCUSED PROBLEM LIST:   Aortic stenosis AVA 0.89, MG 35, Vmax 3.7, SVI 65, EF 60-65%TTE 2024 EKG SR without conduction defects LVH No amyloid or HOCM CMR 2024  Atrial flutter On Eliquis  CAD 90% dLAD, 50%pLAD cath 2022 Hyperlipidemia Aortic atherosclerosis Coronary CTA 2022 Hypertension CKDIIIA Chronic debilitating back pain BMI 37/BSA 2.1  7/25:   [name] consents to use of AI scribe. Jennifer [name] is a Jennifer [name] with Jennifer above listed medical problems referred for recommendations regarding her aortic stenosis.  She has been managed by Dr. Santo.  She underwent coronary angiography which demonstrated a high grade lesion of Jennifer distal LAD and given tortuosity of Jennifer vessel, medical management was pursued.  She did undergo CMR imaging and amyloid was excluded.  Her last echocardiogram demonstrated progression of her aortic stenosis.            Past Medical History:  Diagnosis Date   Acute meniscal tear of left knee    FOLLOWED BY DR GIAFFREY   Anemia    Aortic stenosis    Ascending aorta dilation (HCC)    Atrial flutter (HCC)    AV block, Mobitz 1    BPPV (benign paroxysmal positional vertigo)    CAD (coronary artery disease)    Chronic bladder pain    Chronic fatigue syndrome    Chronic low back pain    W/ RIGHT LEG PAIN AND NUMBNESS   Cystitis, chronic    Fibromyalgia    GERD (gastroesophageal reflux disease)    Hiatal hernia    Hypertension    IC (interstitial cystitis)    LBBB (left bundle branch block)    transient hx   OSA (obstructive sleep apnea)    per pt study yrs ago-- moderate osa ,  intolerant cpap   Pinched vertebral nerve    bilateral L2 -- L3 and L3 -- L4-/  epidural injection's treatment , PT and pain clince   PONV (postoperative nausea  and vomiting)    severe   PVC's (premature ventricular contractions)    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    Self-catheterizes urinary bladder    Spinal stenosis, lumbar region with neurogenic claudication    Wears glasses    Wears partial dentures    upper and lower    Past Surgical History:  Procedure Laterality Date   ABDOMINAL AORTOGRAM N/A 09/16/2020   Procedure: ABDOMINAL AORTOGRAM;  Surgeon: Darron Deatrice LABOR, MD;  Location: [facility];  Service: Cardiovascular;  Laterality: N/A;   ABDOMINAL HYSTERECTOMY  1980   w/ Left Salpingoophorectomy   BIOPSY  08/27/2021   Procedure: BIOPSY;  Surgeon: Rosalie Kitchens, MD;  Location: [facility] ENDOSCOPY;  Service: Gastroenterology;;   CARDIAC CATHETERIZATION  08-21-2001  dr victory sharps   normal coronary arteries and LVF   CARPAL TUNNEL RELEASE Bilateral 1986   CECALCYSTOPLASTY/  APPENDECTOMY  1984   at [facility]   CHOLECYSTECTOMY  1992   CYSTO WITH HYDRODISTENSION N/A 01/12/2016   Procedure: CYSTOSCOPY/HYDRODISTENSION;  Surgeon: Arlena Gal, MD;  Location: [facility];  Service: Urology;  Laterality: N/A;   CYSTO WITH HYDRODISTENSION N/A 08/30/2016   Procedure: CYSTOSCOPY/HYDRODISTENSION OF BLADDER INJECTION OF MARCAINE /PYRIDIUM ;  Surgeon: Gal Arlena, MD;  Location: [facility];  Service: Urology;  Laterality:  N/A;   ESOPHAGOGASTRODUODENOSCOPY (EGD) WITH PROPOFOL  N/A 08/27/2021   Procedure: ESOPHAGOGASTRODUODENOSCOPY (EGD) WITH PROPOFOL ;  Surgeon: Rosalie Kitchens, MD;  Location: [facility] ENDOSCOPY;  Service: Gastroenterology;  Laterality: N/A;   LEFT HEART CATH AND CORONARY ANGIOGRAPHY N/A 09/16/2020   Procedure: LEFT HEART CATH AND CORONARY ANGIOGRAPHY;  Surgeon: Darron Deatrice LABOR, MD;  Location: [facility];  Service: Cardiovascular;  Laterality: N/A;   MULTIPLE CYSTO/  HYDRODISTENTION/  INSTILLATION THERAPY  last one 08-19-2009   NASAL SINUS SURGERY  1987    TONSILLECTOMY  as child   VAGINAL GROWTH REMOVED  as teen    Family History  Problem Relation Age of Onset   CAD Father    Asthma Father    Hypertension Father    Alzheimer's disease Father    Emphysema Father    COPD Father    Heart failure Mother    Peripheral vascular disease Mother    Stroke Mother    Hypertension Mother    Colonic polyp Mother    Cancer Brother        melanoma    Social History   Socioeconomic History   Marital status: Single    Spouse name: none   Number of children: 0   Years of education: College   Highest education level: Not on file  Occupational History   Occupation: n/a  Tobacco Use   Smoking status: Never   Smokeless tobacco: Never  Vaping Use   Vaping status: Never Used  Substance and Sexual Activity   Alcohol use: No    Alcohol/week: 0.0 standard drinks of alcohol   Drug use: No   Sexual activity: Not on file  Other Topics Concern   Not on file  Social History Narrative   Lives alone   Alyse Luke Kerns, RN    Caffeine use: 2 glasses tea/day   Brother   Mother passed in ?62 at 49, father passed in 62's in 2006   She was a Runner, broadcasting/film/video      Social Drivers of Corporate investment banker Strain: Low Risk  (03/22/2022)   Overall Financial Resource Strain (CARDIA)    Difficulty of Paying Living Expenses: Not very hard  Food Insecurity: No Food Insecurity (07/19/2022)   Hunger Vital Sign    Worried About Running Out of Food in Jennifer Last Year: Never true    Ran Out of Food in Jennifer Last Year: Never true  Transportation Needs: No Transportation Needs (07/19/2022)   PRAPARE - Administrator, Civil Service (Medical): No    Lack of Transportation (Non-Medical): No  Physical Activity: Not on file  Stress: No Stress Concern Present (03/22/2022)   [employer] of Occupational Health - Occupational Stress Questionnaire    Feeling of Stress : Only a little  Social Connections: Moderately Integrated (05/15/2021)   Social  Connection and Isolation Panel    Frequency of Communication with Friends and Family: Twice a week    Frequency of Social Gatherings with Friends and Family: Twice a week    Attends Religious Services: 1 to 4 times per year    Active Member of [employer] or Organizations: Yes    Attends Banker Meetings: 1 to 4 times per year    Marital Status: Never married  Intimate Partner Violence: Not At Risk (05/15/2021)   Humiliation, Afraid, Rape, and Kick questionnaire    Fear of Current or Ex-Partner: No    Emotionally Abused: No    Physically Abused: No  Sexually Abused: No     Prior to Admission medications   Medication Sig Start Date End Date Taking? Authorizing Provider  acetaminophen  (TYLENOL ) 500 MG tablet Take 500 mg by mouth 2 (two) times daily as needed (for pain).    [provider]  aluminum hydroxide-magnesium  carbonate (GAVISCON) 95-358 MG/15ML SUSP Take by mouth as needed for indigestion or heartburn. 01/23/17   [provider]  apixaban  (ELIQUIS ) 5 MG TABS tablet Take 1 tablet (5 mg total) by mouth 2 (two) times daily. 09/11/23   Santo Stanly LABOR, MD  cyanocobalamin  (VITAMIN B12) 1000 MCG tablet Take 1,000 mcg by mouth daily. 07/12/22   [provider]  dextromethorphan -guaiFENesin  (MUCINEX  DM) 30-600 MG 12hr tablet Take 1 tablet by mouth every 12 (twelve) hours as needed for cough.    [provider]  ezetimibe  (ZETIA ) 10 MG tablet Take 1 tablet (10 mg total) by mouth daily. 06/10/23   Chandrasekhar, Stanly LABOR, MD  fexofenadine (ALLEGRA) 60 MG tablet Take 60 mg by mouth daily as needed for allergies. 01/06/14   [provider]  fluconazole  (DIFLUCAN ) 100 MG tablet Take 100 mg by mouth daily. 05/14/23   [provider]  furosemide  (LASIX ) 40 MG tablet May take an extra dose once daily as needed for swelling 05/27/23   Chandrasekhar, Mahesh A, MD  gabapentin  (NEURONTIN ) 300 MG capsule Take 300 mg by mouth at bedtime.  07/28/21   [provider]  hydrALAZINE  (APRESOLINE ) 50 MG tablet TAKE 1 TABLET BY MOUTH THREE TIMES A DAY 06/07/23   Chandrasekhar, Mahesh A, MD  HYDROcodone -acetaminophen  (NORCO) 7.5-325 MG tablet Take 1 tablet by mouth 3 (three) times daily as needed.    [provider]  hydrocortisone  (ANUSOL -HC) 25 MG suppository Place 25 mg rectally 2 (two) times daily as needed for hemorrhoids. 12/07/20   [provider]  isosorbide  dinitrate (ISORDIL ) 10 MG tablet TAKE 1 TABLET BY MOUTH THREE TIMES A DAY 06/07/23   Chandrasekhar, Mahesh A, MD  losartan  (COZAAR ) 100 MG tablet TAKE 1 TABLET BY MOUTH EVERY DAY 08/15/23   Chandrasekhar, Mahesh A, MD  meclizine  (ANTIVERT ) 25 MG tablet Take 25 mg by mouth 3 (three) times daily as needed for dizziness.    [provider]  Methen-Hyosc-Meth Blue-Na Phos (UROGESIC-BLUE) 81.6 MG TABS Take 1 tablet by mouth every 6 (six) hours as needed (urinary burning). 08/21/21   [provider]  metoprolol  tartrate (LOPRESSOR ) 25 MG tablet TAKE 1 TABLET BY MOUTH TWICE A DAY 08/15/23   Chandrasekhar, Mahesh A, MD  nitroGLYCERIN  (NITROSTAT ) 0.4 MG SL tablet PLACE 1 TABLET UNDER Jennifer TONGE EVERY 5 MINUTES AS NEEDED FOR CHEST PAIN *TAKE UP TO 3 TABLETS* 02/21/23   Chandrasekhar, Mahesh A, MD  nystatin  cream (MYCOSTATIN ) Apply topically 2 (two) times daily. 08/04/20   Eben Reyes BROCKS, MD  ondansetron  (ZOFRAN -ODT) 4 MG disintegrating tablet Take 4 mg by mouth every 8 (eight) hours as needed for nausea (dissolve orally). 07/01/17   [provider]  pantoprazole  (PROTONIX ) 40 MG tablet Take 40 mg by mouth 2 (two) times daily. 04/15/23   [provider]  polyethylene glycol powder (GLYCOLAX/MIRALAX) 17 GM/SCOOP powder Take 17 g by mouth daily as needed for mild constipation.    [provider]  rosuvastatin  (CRESTOR ) 5 MG tablet Take 1 tablet (5 mg total) by mouth 3 (three) times a week. 08/03/22 05/27/23  Santo Stanly LABOR, MD   senna (SENOKOT) 8.6 MG tablet Take 1 tablet by mouth as needed for constipation.  07/28/21   [provider]  spironolactone  (ALDACTONE ) 25 MG tablet Take 1 tablet (25 mg total) by mouth daily. 09/17/23   Chandrasekhar, Stanly LABOR, MD  sucralfate  (CARAFATE ) 1 g tablet Take 1 tablet (1 g total) by mouth 4 (four) times daily -  with meals and at bedtime. [name] taking differently: Take 1 g by mouth daily at 6 (six) AM. 1 tab in am, 1 tab in pm 08/28/21   Patsy Lenis, MD  triamcinolone  cream (KENALOG ) 0.1 % Apply 1 application. topically 2 (two) times daily as needed (rash). 04/06/21   [provider]    Allergies  Allergen Reactions   Moxifloxacin Hives, Shortness Of Breath, Swelling and Rash    Rash was severe, caused tremors, ans skin became BLOODY RED   Quinolones Anaphylaxis   Sulfa Antibiotics Hives and Rash   Furosemide  Other (See Comments)    CANNOT TAKE DUE TO Interstitial cystitis   Aspirin  Nausea Only and Other (See Comments)    GI and stomach upset   Doxazosin      constipation   Duloxetine     Other reaction(s): Other (See Comments)   Duloxetine Hcl Nausea Only and Other (See Comments)    Vertigo and heart racing also    Hydrochlorothiazide Other (See Comments)    Headaches    Ibuprofen Nausea Only and Other (See Comments)    GI upset   Nebivolol  Hcl     Other reaction(s): extreme fatigue   Other Other (See Comments)   Ranexa  [Ranolazine ]     Constipation/heart pounding   Robaxin [Methocarbamol] Other (See Comments)    Make me feel flu-like symptoms   Topiramate Itching   Xarelto  [Rivaroxaban ]     Back pain   Zofran  [Ondansetron  Hcl] Other (See Comments)    Headaches    Codeine Hives, Nausea And Vomiting and Rash   Dimethyl Sulfoxide Hives and Itching   Macrodantin  [Nitrofurantoin  Macrocrystal] Itching, Nausea And Vomiting and Rash    Abdominal and chest pain, also- blood-red skin, also   Nitrofurantoin  Diarrhea, Itching, Rash and Other (See  Comments)    GI/stomach upset, sweating, chest pain, and skin turned bloody red   Tape Rash   Yellow Dye Rash    REVIEW OF SYSTEMS:  General: no fevers/chills/night sweats Eyes: no blurry vision, diplopia, or amaurosis ENT: no sore throat or hearing loss Resp: no cough, wheezing, or hemoptysis CV: no edema or palpitations GI: no abdominal pain, nausea, vomiting, diarrhea, or constipation GU: no dysuria, frequency, or hematuria Skin: no rash Neuro: no headache, numbness, tingling, or weakness of extremities Musculoskeletal: no joint pain or swelling Heme: no bleeding, DVT, or easy bruising Endo: no polydipsia or polyuria  There were no vitals taken for this visit.  PHYSICAL EXAM: GEN:  AO x 3 in no acute distress HEENT: normal Dentition: Normal*** Neck: JVP normal. +2***carotid upstrokes without bruits. No thyromegaly. Lungs: equal expansion, clear bilaterally CV: Apex is discrete and nondisplaced, RRR without murmur or gallop*** Abd: soft, non-tender, non-distended; no bruit; positive bowel sounds Ext: no edema, ecchymoses, or cyanosis Vascular: 2+ femoral pulses, 2+ radial pulses       Skin: warm and dry without rash Neuro: CN II-XII grossly intact; motor and sensory grossly intact    DATA AND STUDIES:  EKG:  EKG Interpretation Date/Time:    Ventricular Rate:    PR Interval:    QRS Duration:    QT Interval:    QTC Calculation:   R Axis:  Text Interpretation:          05/30/2023: ALT 11; BUN 27; Creatinine 1.12; Hemoglobin 10.8; Platelet Count 285; Potassium 4.5; Sodium 140   STS RISK CALCULATOR: Pending  NHYA CLASS: ***    ASSESSMENT AND PLAN:   1. Nonrheumatic aortic valve stenosis   2. Typical atrial flutter (HCC)   3. Secondary hypercoagulable state (HCC)   4. Coronary artery disease of native artery of native heart with stable angina pectoris (HCC)   5. Hyperlipidemia LDL goal <70   6. Aortic atherosclerosis (HCC)   7. Stage 3a chronic  kidney disease (HCC)     Aortic stenosis: I reviewed her echocardiogram from November.  This demonstrates a thickened aortic valve with restricted motion.  Her mean gradient was consistent with moderate aortic stenosis.  I calculated her stroke-volume index which was 65 mL/m.  I will obtain another echocardiogram to determine whether she still has moderate aortic stenosis or has progressed.*** Atrial flutter: Continue Eliquis  5 mg twice daily and Lopressor  25 mg twice daily Secondary hypercoagulable state: Continue Eliquis  5 mg twice daily Coronary artery disease: Continue Eliquis  5 mg twice daily, rosuvastatin  5 mg 3 times a week, and Lopressor  25 mg daily.-Aortic intervention is pursued then Jennifer [name] will require coronary angiography. Hyperlipidemia: Continue rosuvastatin  5 mg 3 times a week Aortic atherosclerosis: Continue Eliquis  5 mg twice daily and rosuvastatin  5 mg 3 times a week CKD stage IIIa: Continue losartan  100 mg.   I have personally reviewed Jennifer patients imaging data as summarized above.  I have reviewed Jennifer natural history of aortic stenosis with Jennifer [name] and family members who are present today. We have discussed Jennifer limitations of medical therapy and Jennifer poor prognosis associated with symptomatic aortic stenosis. We have also reviewed potential treatment options, including palliative medical therapy, conventional surgical aortic valve replacement, and transcatheter aortic valve replacement. We discussed treatment options in Jennifer context of this [name]'s specific comorbid medical conditions.   All of Jennifer [name]'s questions were answered today. Will make further recommendations based on Jennifer results of studies outlined above.   I spent *** minutes reviewing all clinical data during and prior to this visit including all relevant imaging studies, laboratories, clinical information from other health systems and prior notes from both Cardiology and other specialties, interviewing  Jennifer [name], conducting a complete physical examination, and coordinating care in order to formulate a comprehensive and personalized evaluation and treatment plan.   Izza Bickle K Senai Kingsley, MD  10/23/2023 10:00 AM    [facility] [address] [place], [place], [place]  [address] Phone: [phone]; Fax: [phone]

## 2023-10-24 ENCOUNTER — Telehealth: Payer: Self-pay

## 2023-10-24 NOTE — Telephone Encounter (Signed)
 The patient called to report worsening GERD symptoms, dizziness, lightheadedness,and extreme fatigue for the last week.  Spoke with the patient at length. She historically has increased her Protonix  to 40 mg BID during flare-ups, but this has not helped. She was putting away groceries the other day and even lifting a half gallon of milk gave her chest pain. She took 1 tablet of NTG, rested, and the pain subsided.  The patient has an extensive cardiac history including AS and CAD - she had a cath in 2022 which showed 90% stenosis in distal LAD but PCI not completed due to high risk of dissection, sidebranch closure, and the fact the distal LAD after the stenosis was diffusely diseased. She did have a normal myoview  in 2023.  The patient has been scheduled several times for TAVR consult but has rescheduled due to back pain. She is scheduled with Dr. Wendel for TAVR consult next week. After much discussion, the patient stated I do not think I will make it to the appointment with him on Tuesday. She also repeatedly said I know myself and something bad is going on with my body. It was agreed the patient should proceed to the ER for evaluation of CP, fatigue, and dizziness. She does not have a car and rents one several days a month to run errands. She will call 911 for evaluation and transport to [facility].  She was very concerned about her appointment with Dr. Wendel next week. She understood that it will be cancelled if she is hospitalized. She was grateful for time spent.

## 2023-10-28 ENCOUNTER — Telehealth: Payer: Self-pay

## 2023-10-28 NOTE — Telephone Encounter (Signed)
 The pt called into the office to reschedule her structural heart evaluation scheduled on 7/29.  The pt states the the symptoms she called into the office with last week have resolved but now she is experiencing significant back pain and this is limiting her mobility. The patient has arranged evaluation with her back doctor for treatment.  Multiple appointments offered to the patient and she chose 8/20 with Dr Wendel.

## 2023-10-29 ENCOUNTER — Ambulatory Visit: Admitting: Internal Medicine

## 2023-10-29 ENCOUNTER — Institutional Professional Consult (permissible substitution): Admitting: Internal Medicine

## 2023-10-29 DIAGNOSIS — I700 Atherosclerosis of aorta: Secondary | ICD-10-CM

## 2023-10-29 DIAGNOSIS — I25118 Atherosclerotic heart disease of native coronary artery with other forms of angina pectoris: Secondary | ICD-10-CM

## 2023-10-29 DIAGNOSIS — N1831 Chronic kidney disease, stage 3a: Secondary | ICD-10-CM

## 2023-10-29 DIAGNOSIS — D6869 Other thrombophilia: Secondary | ICD-10-CM

## 2023-10-29 DIAGNOSIS — I483 Typical atrial flutter: Secondary | ICD-10-CM

## 2023-10-29 DIAGNOSIS — E785 Hyperlipidemia, unspecified: Secondary | ICD-10-CM

## 2023-10-29 DIAGNOSIS — I350 Nonrheumatic aortic (valve) stenosis: Secondary | ICD-10-CM

## 2023-11-07 DIAGNOSIS — N390 Urinary tract infection, site not specified: Secondary | ICD-10-CM | POA: Diagnosis not present

## 2023-11-07 DIAGNOSIS — N3010 Interstitial cystitis (chronic) without hematuria: Secondary | ICD-10-CM | POA: Diagnosis not present

## 2023-11-11 NOTE — Progress Notes (Deleted)
 Patient ID: Jennifer [name] MRN: [id number] DOB/AGE: 76/76/1947 76 y.o.  Primary Care Physician:Wharton, Charmaine, [place] Primary Cardiologist: Santo  CC:  Aortic valvular disease management    FOCUSED PROBLEM LIST:   Aortic stenosis AVA 0.89, MG 35, Vmax 3.7, SVI 65, EF 60-65% TTE 2024 EKG SR without conduction defects LVH No amyloid or HOCM CMR 2024  Atrial flutter On Eliquis  CAD 90% dLAD, 50%pLAD cath 2022 Hyperlipidemia Aortic atherosclerosis Coronary CTA 2022 Hypertension CKDIIIA Chronic debilitating back pain BMI 37/BSA 2.1  7/25:   Patient consents to use of AI scribe. The patient is a 78F with the above listed medical problems referred for recommendations regarding her aortic stenosis.  She has been managed by Dr. Santo.  She underwent coronary angiography which demonstrated a high grade lesion of the distal LAD and given tortuosity of the vessel, medical management was pursued.  She did undergo CMR imaging and amyloid was excluded.  Her last echocardiogram demonstrated progression of her aortic stenosis.            Past Medical History:  Diagnosis Date   Acute meniscal tear of left knee    FOLLOWED BY DR GIAFFREY   Anemia    Aortic stenosis    Ascending aorta dilation (HCC)    Atrial flutter (HCC)    AV block, Mobitz 1    BPPV (benign paroxysmal positional vertigo)    CAD (coronary artery disease)    Chronic bladder pain    Chronic fatigue syndrome    Chronic low back pain    W/ RIGHT LEG PAIN AND NUMBNESS   Cystitis, chronic    Fibromyalgia    GERD (gastroesophageal reflux disease)    Hiatal hernia    Hypertension    IC (interstitial cystitis)    LBBB (left bundle branch block)    transient hx   OSA (obstructive sleep apnea)    per pt study yrs ago-- moderate osa ,  intolerant cpap   Pinched vertebral nerve    bilateral L2 -- L3 and L3 -- L4-/  epidural injection's treatment , PT and pain clince   PONV (postoperative nausea  and vomiting)    severe   PVC's (premature ventricular contractions)    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    Self-catheterizes urinary bladder    Spinal stenosis, lumbar region with neurogenic claudication    Wears glasses    Wears partial dentures    upper and lower    Past Surgical History:  Procedure Laterality Date   ABDOMINAL AORTOGRAM N/A 09/16/2020   Procedure: ABDOMINAL AORTOGRAM;  Surgeon: Darron Deatrice LABOR, MD;  Location: [facility];  Service: Cardiovascular;  Laterality: N/A;   ABDOMINAL HYSTERECTOMY  1980   w/ Left Salpingoophorectomy   BIOPSY  08/27/2021   Procedure: BIOPSY;  Surgeon: Rosalie Kitchens, MD;  Location: [facility];  Service: Gastroenterology;;   CARDIAC CATHETERIZATION  08-21-2001  dr victory sharps   normal coronary arteries and LVF   CARPAL TUNNEL RELEASE Bilateral 1986   CECALCYSTOPLASTY/  APPENDECTOMY  1984   at [facility]   CHOLECYSTECTOMY  1992   CYSTO WITH HYDRODISTENSION N/A 01/12/2016   Procedure: CYSTOSCOPY/HYDRODISTENSION;  Surgeon: Arlena Gal, MD;  Location[facility];  Service: Urology;  Laterality: N/A;   CYSTO WITH HYDRODISTENSION N/A 08/30/2016   Procedure: CYSTOSCOPY/HYDRODISTENSION OF BLADDER INJECTION OF MARCAINE /PYRIDIUM ;  Surgeon: Gal Arlena, MD;  Location: [facility];  Service: Urology;  Laterality: N/A;   ESOPHAGOGASTRODUODENOSCOPY (EGD) WITH PROPOFOL  N/A 08/27/2021   Procedure: ESOPHAGOGASTRODUODENOSCOPY (EGD) WITH PROPOFOL ;  Surgeon: Rosalie Kitchens, MD;  Location: [facility];  Service: Gastroenterology;  Laterality: N/A;   LEFT HEART CATH AND CORONARY ANGIOGRAPHY N/A 09/16/2020   Procedure: LEFT HEART CATH AND CORONARY ANGIOGRAPHY;  Surgeon: Darron Deatrice LABOR, MD;  Location: [facility];  Service: Cardiovascular;  Laterality: N/A;   MULTIPLE CYSTO/  HYDRODISTENTION/  INSTILLATION THERAPY  last one 08-19-2009   NASAL SINUS SURGERY  1987    TONSILLECTOMY  as child   VAGINAL GROWTH REMOVED  as teen    Family History  Problem Relation Age of Onset   CAD Father    Asthma Father    Hypertension Father    Alzheimer's disease Father    Emphysema Father    COPD Father    Heart failure Mother    Peripheral vascular disease Mother    Stroke Mother    Hypertension Mother    Colonic polyp Mother    Cancer Brother        melanoma    Social History   Socioeconomic History   Marital status: Single    Spouse name: none   Number of children: 0   Years of education: College   Highest education level: Not on file  Occupational History   Occupation: n/a  Tobacco Use   Smoking status: Never   Smokeless tobacco: Never  Vaping Use   Vaping status: Never Used  Substance and Sexual Activity   Alcohol use: No    Alcohol/week: 0.0 standard drinks of alcohol   Drug use: No   Sexual activity: Not on file  Other Topics Concern   Not on file  Social History Narrative   Lives alone   Alyse Luke Kerns, RN    Caffeine use: 2 glasses tea/day   Brother   Mother passed in ?6 at 34, father passed in 26's in 2006   She was a Runner, broadcasting/film/video      Social Drivers of Corporate investment banker Strain: Low Risk  (03/22/2022)   Overall Financial Resource Strain (CARDIA)    Difficulty of Paying Living Expenses: Not very hard  Food Insecurity: No Food Insecurity (07/19/2022)   Hunger Vital Sign    Worried About Running Out of Food in the Last Year: Never true    Ran Out of Food in the Last Year: Never true  Transportation Needs: No Transportation Needs (07/19/2022)   PRAPARE - Administrator, Civil Service (Medical): No    Lack of Transportation (Non-Medical): No  Physical Activity: Not on file  Stress: No Stress Concern Present (03/22/2022)   [employer] of Occupational Health - Occupational Stress Questionnaire    Feeling of Stress : Only a little  Social Connections: Moderately Integrated (05/15/2021)   Social  Connection and Isolation Panel    Frequency of Communication with Friends and Family: Twice a week    Frequency of Social Gatherings with Friends and Family: Twice a week    Attends Religious Services: 1 to 4 times per year    Active Member of [employer] or Organizations: Yes    Attends Banker Meetings: 1 to 4 times per year    Marital Status: Never married  Intimate Partner Violence: Not At Risk (05/15/2021)   Humiliation, Afraid, Rape, and Kick questionnaire    Fear of Current or Ex-Partner: No    Emotionally Abused: No    Physically Abused: No  Sexually Abused: No     Prior to Admission medications   Medication Sig Start Date End Date Taking? Authorizing Provider  acetaminophen  (TYLENOL ) 500 MG tablet Take 500 mg by mouth 2 (two) times daily as needed (for pain).    [provider]  aluminum hydroxide-magnesium  carbonate (GAVISCON) 95-358 MG/15ML SUSP Take by mouth as needed for indigestion or heartburn. 01/23/17   [provider]  apixaban  (ELIQUIS ) 5 MG TABS tablet Take 1 tablet (5 mg total) by mouth 2 (two) times daily. 09/11/23   Santo Stanly LABOR, MD  cyanocobalamin  (VITAMIN B12) 1000 MCG tablet Take 1,000 mcg by mouth daily. 07/12/22   [provider]  dextromethorphan -guaiFENesin  (MUCINEX  DM) 30-600 MG 12hr tablet Take 1 tablet by mouth every 12 (twelve) hours as needed for cough.    [provider]  ezetimibe  (ZETIA ) 10 MG tablet Take 1 tablet (10 mg total) by mouth daily. 06/10/23   Chandrasekhar, Stanly LABOR, MD  fexofenadine (ALLEGRA) 60 MG tablet Take 60 mg by mouth daily as needed for allergies. 01/06/14   [provider]  fluconazole  (DIFLUCAN ) 100 MG tablet Take 100 mg by mouth daily. 05/14/23   [provider]  furosemide  (LASIX ) 40 MG tablet May take an extra dose once daily as needed for swelling 05/27/23   Chandrasekhar, Mahesh A, MD  gabapentin  (NEURONTIN ) 300 MG capsule Take 300 mg by mouth at bedtime.  07/28/21   [provider]  hydrALAZINE  (APRESOLINE ) 50 MG tablet TAKE 1 TABLET BY MOUTH THREE TIMES A DAY 06/07/23   Chandrasekhar, Mahesh A, MD  HYDROcodone -acetaminophen  (NORCO) 7.5-325 MG tablet Take 1 tablet by mouth 3 (three) times daily as needed.    [provider]  hydrocortisone  (ANUSOL -HC) 25 MG suppository Place 25 mg rectally 2 (two) times daily as needed for hemorrhoids. 12/07/20   [provider]  isosorbide  dinitrate (ISORDIL ) 10 MG tablet TAKE 1 TABLET BY MOUTH THREE TIMES A DAY 06/07/23   Chandrasekhar, Mahesh A, MD  losartan  (COZAAR ) 100 MG tablet TAKE 1 TABLET BY MOUTH EVERY DAY 08/15/23   Chandrasekhar, Mahesh A, MD  meclizine  (ANTIVERT ) 25 MG tablet Take 25 mg by mouth 3 (three) times daily as needed for dizziness.    [provider]  Methen-Hyosc-Meth Blue-Na Phos (UROGESIC-BLUE) 81.6 MG TABS Take 1 tablet by mouth every 6 (six) hours as needed (urinary burning). 08/21/21   [provider]  metoprolol  tartrate (LOPRESSOR ) 25 MG tablet TAKE 1 TABLET BY MOUTH TWICE A DAY 08/15/23   Chandrasekhar, Mahesh A, MD  nitroGLYCERIN  (NITROSTAT ) 0.4 MG SL tablet PLACE 1 TABLET UNDER THE TONGE EVERY 5 MINUTES AS NEEDED FOR CHEST PAIN *TAKE UP TO 3 TABLETS* 02/21/23   Chandrasekhar, Mahesh A, MD  nystatin  cream (MYCOSTATIN ) Apply topically 2 (two) times daily. 08/04/20   Eben Reyes BROCKS, MD  ondansetron  (ZOFRAN -ODT) 4 MG disintegrating tablet Take 4 mg by mouth every 8 (eight) hours as needed for nausea (dissolve orally). 07/01/17   [provider]  pantoprazole  (PROTONIX ) 40 MG tablet Take 40 mg by mouth 2 (two) times daily. 04/15/23   [provider]  polyethylene glycol powder (GLYCOLAX/MIRALAX) 17 GM/SCOOP powder Take 17 g by mouth daily as needed for mild constipation.    [provider]  rosuvastatin  (CRESTOR ) 5 MG tablet Take 1 tablet (5 mg total) by mouth 3 (three) times a week. 08/03/22 05/27/23  Santo Stanly LABOR, MD   senna (SENOKOT) 8.6 MG tablet Take 1 tablet by mouth as needed for constipation.  07/28/21   [provider]  spironolactone  (ALDACTONE ) 25 MG tablet Take 1 tablet (25 mg total) by mouth daily. 09/17/23   Santo Stanly LABOR, MD  sucralfate  (CARAFATE ) 1 g tablet Take 1 tablet (1 g total) by mouth 4 (four) times daily -  with meals and at bedtime. Patient taking differently: Take 1 g by mouth daily at 6 (six) AM. 1 tab in am, 1 tab in pm 08/28/21   Patsy Lenis, MD  triamcinolone  cream (KENALOG ) 0.1 % Apply 1 application. topically 2 (two) times daily as needed (rash). 04/06/21   [provider]    Allergies  Allergen Reactions   Moxifloxacin Hives, Shortness Of Breath, Swelling and Rash    Rash was severe, caused tremors, ans skin became BLOODY RED   Quinolones Anaphylaxis   Sulfa Antibiotics Hives and Rash   Furosemide  Other (See Comments)    CANNOT TAKE DUE TO Interstitial cystitis   Aspirin  Nausea Only and Other (See Comments)    GI and stomach upset   Doxazosin      constipation   Duloxetine     Other reaction(s): Other (See Comments)   Duloxetine Hcl Nausea Only and Other (See Comments)    Vertigo and heart racing also    Hydrochlorothiazide Other (See Comments)    Headaches    Ibuprofen Nausea Only and Other (See Comments)    GI upset   Nebivolol  Hcl     Other reaction(s): extreme fatigue   Other Other (See Comments)   Ranexa  [Ranolazine ]     Constipation/heart pounding   Robaxin [Methocarbamol] Other (See Comments)    Make me feel flu-like symptoms   Topiramate Itching   Xarelto  [Rivaroxaban ]     Back pain   Zofran  [Ondansetron  Hcl] Other (See Comments)    Headaches    Codeine Hives, Nausea And Vomiting and Rash   Dimethyl Sulfoxide Hives and Itching   Macrodantin  [Nitrofurantoin  Macrocrystal] Itching, Nausea And Vomiting and Rash    Abdominal and chest pain, also- blood-red skin, also   Nitrofurantoin  Diarrhea, Itching, Rash and Other (See  Comments)    GI/stomach upset, sweating, chest pain, and skin turned bloody red   Tape Rash   Yellow Dye #6 (Sunset Yellow) Rash    REVIEW OF SYSTEMS:  General: no fevers/chills/night sweats Eyes: no blurry vision, diplopia, or amaurosis ENT: no sore throat or hearing loss Resp: no cough, wheezing, or hemoptysis CV: no edema or palpitations GI: no abdominal pain, nausea, vomiting, diarrhea, or constipation GU: no dysuria, frequency, or hematuria Skin: no rash Neuro: no headache, numbness, tingling, or weakness of extremities Musculoskeletal: no joint pain or swelling Heme: no bleeding, DVT, or easy bruising Endo: no polydipsia or polyuria  There were no vitals taken for this visit.  PHYSICAL EXAM: GEN:  AO x 3 in no acute distress HEENT: normal Dentition: Normal*** Neck: JVP normal. +2***carotid upstrokes without bruits. No thyromegaly. Lungs: equal expansion, clear bilaterally CV: Apex is discrete and nondisplaced, RRR without murmur or gallop*** Abd: soft, non-tender, non-distended; no bruit; positive bowel sounds Ext: no edema, ecchymoses, or cyanosis Vascular: 2+ femoral pulses, 2+ radial pulses       Skin: warm and dry without rash Neuro: CN II-XII grossly intact; motor and sensory grossly intact    DATA AND STUDIES:  EKG:  EKG Interpretation Date/Time:    Ventricular Rate:    PR Interval:    QRS Duration:    QT Interval:    QTC Calculation:   R Axis:  Text Interpretation:          05/30/2023: ALT 11; BUN 27; Creatinine 1.12; Hemoglobin 10.8; Platelet Count 285; Potassium 4.5; Sodium 140   STS RISK CALCULATOR: Pending  NHYA CLASS: ***    ASSESSMENT AND PLAN:   1. Nonrheumatic aortic valve stenosis   2. Atrial flutter, unspecified type (HCC)   3. Secondary hypercoagulable state (HCC)   4. Coronary artery disease of native artery of native heart with stable angina pectoris (HCC)   5. Hyperlipidemia LDL goal <70   6. Aortic atherosclerosis  (HCC)   7. Stage 3a chronic kidney disease (HCC)   8. BMI 37.0-37.9, adult      Aortic stenosis: I reviewed her echocardiogram from November.  This demonstrates a thickened aortic valve with restricted motion.  Her mean gradient was consistent with moderate aortic stenosis.  I calculated her stroke-volume index which was 65 mL/m.  I will obtain another echocardiogram to determine whether she still has moderate aortic stenosis or has progressed.*** Atrial flutter: Continue Eliquis  5 mg twice daily and Lopressor  25 mg twice daily Secondary hypercoagulable state: Continue Eliquis  5 mg twice daily Coronary artery disease: Continue Eliquis  5 mg twice daily, rosuvastatin  5 mg 3 times a week, and Lopressor  25 mg daily.-Aortic intervention is pursued then the patient will require coronary angiography. Hyperlipidemia: Continue rosuvastatin  5 mg 3 times a week Aortic atherosclerosis: Continue Eliquis  5 mg twice daily and rosuvastatin  5 mg 3 times a week CKD stage IIIa: Continue losartan  100 mg. Elevated BMI/BSA: Diet and exercise modification; may have an effect on valve type if a TAVR is ultimately pursued.   I have personally reviewed the patients imaging data as summarized above.  I have reviewed the natural history of aortic stenosis with the patient and family members who are present today. We have discussed the limitations of medical therapy and the poor prognosis associated with symptomatic aortic stenosis. We have also reviewed potential treatment options, including palliative medical therapy, conventional surgical aortic valve replacement, and transcatheter aortic valve replacement. We discussed treatment options in the context of this patient's specific comorbid medical conditions.   All of the patient's questions were answered today. Will make further recommendations based on the results of studies outlined above.   I spent *** minutes reviewing all clinical data during and prior to this  visit including all relevant imaging studies, laboratories, clinical information from other health systems and prior notes from both Cardiology and other specialties, interviewing the patient, conducting a complete physical examination, and coordinating care in order to formulate a comprehensive and personalized evaluation and treatment plan.   Olean Sangster K Yvonne Petite, MD  11/11/2023 1:11 PM    [facility] [address] [place], [place], [place]  [address] Phone: [phone]; Fax: [phone]

## 2023-11-17 NOTE — Progress Notes (Deleted)
 [facility]  PATIENT: Jennifer [name] DOB: October 04, 1945  REFERRING DOCTOR OR PCP: Charmaine Bright, PA-C SOURCE: ***  _________________________________   HISTORICAL  CHIEF COMPLAINT:  No chief complaint on file.   HISTORY OF PRESENT ILLNESS:  ***   Imaging: MRI of the lumbar spine 08/17/2021 showed marked levoscoliosis.  At L3-L4, there is moderate to severe spinal stenosis and moderately severe right foraminal narrowing and left lateral recess stenosis which could affect the right L3 and left L4 nerve roots.  At L4-L5, foraminal and lateral recess stenosis could lead to left L4 and left L5 nerve root compression.  CT scan of the head 09/15/2020 showed mild generalized cortical atrophy without a lobar predominance.  Only minimal chronic microvascular ischemic change.  MRI of the brain 10/10/2017 showed mild generalized cortical atrophy and a few scattered T2/FLAIR hyperintense foci in the hemispheres.   Laboratory: Urine tox screen 09/13/2023 showed opiates consistent with her known hydrocodone  prescription.   Labs 05/30/2023 showed mildly elevated creatinine with GFR of 51.  B12 and ferritin were unremarkable.  REVIEW OF SYSTEMS: Constitutional: No fevers, chills, sweats, or change in appetite Eyes: No visual changes, double vision, eye pain Ear, nose and throat: No hearing loss, ear pain, nasal congestion, sore throat Cardiovascular: No chest pain, palpitations Respiratory:  No shortness of breath at rest or with exertion.   No wheezes GastrointestinaI: No nausea, vomiting, diarrhea, abdominal pain, fecal incontinence Genitourinary:  No dysuria, urinary retention or frequency.  No nocturia. Musculoskeletal:  No neck pain, back pain Integumentary: No rash, pruritus, skin lesions Neurological: as above Psychiatric: No depression at this time.  No anxiety Endocrine: No palpitations, diaphoresis, change in appetite, change in weigh or increased  thirst Hematologic/Lymphatic:  No anemia, purpura, petechiae. Allergic/Immunologic: No itchy/runny eyes, nasal congestion, recent allergic reactions, rashes  ALLERGIES: Allergies  Allergen Reactions   Moxifloxacin Hives, Shortness Of Breath, Swelling and Rash    Rash was severe, caused tremors, ans skin became BLOODY RED   Quinolones Anaphylaxis   Sulfa Antibiotics Hives and Rash   Furosemide  Other (See Comments)    CANNOT TAKE DUE TO Interstitial cystitis   Aspirin  Nausea Only and Other (See Comments)    GI and stomach upset   Doxazosin      constipation   Duloxetine     Other reaction(s): Other (See Comments)   Duloxetine Hcl Nausea Only and Other (See Comments)    Vertigo and heart racing also    Hydrochlorothiazide Other (See Comments)    Headaches    Ibuprofen Nausea Only and Other (See Comments)    GI upset   Nebivolol  Hcl     Other reaction(s): extreme fatigue   Other Other (See Comments)   Ranexa  [Ranolazine ]     Constipation/heart pounding   Robaxin [Methocarbamol] Other (See Comments)    Make me feel flu-like symptoms   Topiramate Itching   Xarelto  [Rivaroxaban ]     Back pain   Zofran  [Ondansetron  Hcl] Other (See Comments)    Headaches    Codeine Hives, Nausea And Vomiting and Rash   Dimethyl Sulfoxide Hives and Itching   Macrodantin  [Nitrofurantoin  Macrocrystal] Itching, Nausea And Vomiting and Rash    Abdominal and chest pain, also- blood-red skin, also   Nitrofurantoin  Diarrhea, Itching, Rash and Other (See Comments)    GI/stomach upset, sweating, chest pain, and skin turned bloody red   Tape Rash   Yellow Dye #6 (Sunset Yellow) Rash    HOME MEDICATIONS:  Current Outpatient Medications:  acetaminophen  (TYLENOL ) 500 MG tablet, Take 500 mg by mouth 2 (two) times daily as needed (for pain)., Disp: , Rfl:    aluminum hydroxide-magnesium  carbonate (GAVISCON) 95-358 MG/15ML SUSP, Take by mouth as needed for indigestion or heartburn., Disp: , Rfl:     apixaban  (ELIQUIS ) 5 MG TABS tablet, Take 1 tablet (5 mg total) by mouth 2 (two) times daily., Disp: 180 tablet, Rfl: 1   cyanocobalamin  (VITAMIN B12) 1000 MCG tablet, Take 1,000 mcg by mouth daily., Disp: , Rfl:    dextromethorphan -guaiFENesin  (MUCINEX  DM) 30-600 MG 12hr tablet, Take 1 tablet by mouth every 12 (twelve) hours as needed for cough., Disp: , Rfl:    ezetimibe  (ZETIA ) 10 MG tablet, Take 1 tablet (10 mg total) by mouth daily., Disp: 90 tablet, Rfl: 3   fexofenadine (ALLEGRA) 60 MG tablet, Take 60 mg by mouth daily as needed for allergies., Disp: , Rfl:    fluconazole  (DIFLUCAN ) 100 MG tablet, Take 100 mg by mouth daily., Disp: , Rfl:    furosemide  (LASIX ) 40 MG tablet, May take an extra dose once daily as needed for swelling, Disp: 90 tablet, Rfl: 3   gabapentin  (NEURONTIN ) 300 MG capsule, Take 300 mg by mouth at bedtime., Disp: , Rfl:    hydrALAZINE  (APRESOLINE ) 50 MG tablet, TAKE 1 TABLET BY MOUTH THREE TIMES A DAY, Disp: 270 tablet, Rfl: 3   HYDROcodone -acetaminophen  (NORCO) 7.5-325 MG tablet, Take 1 tablet by mouth 3 (three) times daily as needed., Disp: , Rfl:    hydrocortisone  (ANUSOL -HC) 25 MG suppository, Place 25 mg rectally 2 (two) times daily as needed for hemorrhoids., Disp: , Rfl:    isosorbide  dinitrate (ISORDIL ) 10 MG tablet, TAKE 1 TABLET BY MOUTH THREE TIMES A DAY, Disp: 270 tablet, Rfl: 3   losartan  (COZAAR ) 100 MG tablet, TAKE 1 TABLET BY MOUTH EVERY DAY, Disp: 90 tablet, Rfl: 3   meclizine  (ANTIVERT ) 25 MG tablet, Take 25 mg by mouth 3 (three) times daily as needed for dizziness., Disp: , Rfl:    Methen-Hyosc-Meth Blue-Na Phos (UROGESIC-BLUE) 81.6 MG TABS, Take 1 tablet by mouth every 6 (six) hours as needed (urinary burning)., Disp: , Rfl:    metoprolol  tartrate (LOPRESSOR ) 25 MG tablet, TAKE 1 TABLET BY MOUTH TWICE A DAY, Disp: 180 tablet, Rfl: 3   nitroGLYCERIN  (NITROSTAT ) 0.4 MG SL tablet, PLACE 1 TABLET UNDER THE TONGE EVERY 5 MINUTES AS NEEDED FOR CHEST PAIN  *TAKE UP TO 3 TABLETS*, Disp: 25 tablet, Rfl: 3   nystatin  cream (MYCOSTATIN ), Apply topically 2 (two) times daily., Disp: 30 g, Rfl: 3   ondansetron  (ZOFRAN -ODT) 4 MG disintegrating tablet, Take 4 mg by mouth every 8 (eight) hours as needed for nausea (dissolve orally)., Disp: , Rfl: 1   pantoprazole  (PROTONIX ) 40 MG tablet, Take 40 mg by mouth 2 (two) times daily., Disp: , Rfl:    polyethylene glycol powder (GLYCOLAX/MIRALAX) 17 GM/SCOOP powder, Take 17 g by mouth daily as needed for mild constipation., Disp: , Rfl:    rosuvastatin  (CRESTOR ) 5 MG tablet, Take 1 tablet (5 mg total) by mouth 3 (three) times a week., Disp: 36 tablet, Rfl: 3   senna (SENOKOT) 8.6 MG tablet, Take 1 tablet by mouth as needed for constipation., Disp: , Rfl:    spironolactone  (ALDACTONE ) 25 MG tablet, Take 1 tablet (25 mg total) by mouth daily., Disp: 90 tablet, Rfl: 2   sucralfate  (CARAFATE ) 1 g tablet, Take 1 tablet (1 g total) by mouth 4 (four) times daily -  with  meals and at bedtime. (Patient taking differently: Take 1 g by mouth daily at 6 (six) AM. 1 tab in am, 1 tab in pm), Disp: 120 tablet, Rfl: 3   triamcinolone  cream (KENALOG ) 0.1 %, Apply 1 application. topically 2 (two) times daily as needed (rash)., Disp: , Rfl:   PAST MEDICAL HISTORY: Past Medical History:  Diagnosis Date   Acute meniscal tear of left knee    FOLLOWED BY DR GIAFFREY   Anemia    Aortic stenosis    Ascending aorta dilation (HCC)    Atrial flutter (HCC)    AV block, Mobitz 1    BPPV (benign paroxysmal positional vertigo)    CAD (coronary artery disease)    Chronic bladder pain    Chronic fatigue syndrome    Chronic low back pain    W/ RIGHT LEG PAIN AND NUMBNESS   Cystitis, chronic    Fibromyalgia    GERD (gastroesophageal reflux disease)    Hiatal hernia    Hypertension    IC (interstitial cystitis)    LBBB (left bundle branch block)    transient hx   OSA (obstructive sleep apnea)    per pt study yrs ago-- moderate osa ,   intolerant cpap   Pinched vertebral nerve    bilateral L2 -- L3 and L3 -- L4-/  epidural injection's treatment , PT and pain clince   PONV (postoperative nausea and vomiting)    severe   PVC's (premature ventricular contractions)    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    Self-catheterizes urinary bladder    Spinal stenosis, lumbar region with neurogenic claudication    Wears glasses    Wears partial dentures    upper and lower    PAST SURGICAL HISTORY: Past Surgical History:  Procedure Laterality Date   ABDOMINAL AORTOGRAM N/A 09/16/2020   Procedure: ABDOMINAL AORTOGRAM;  Surgeon: Darron Deatrice LABOR, MD;  Location: [facility];  Service: Cardiovascular;  Laterality: N/A;   ABDOMINAL HYSTERECTOMY  1980   w/ Left Salpingoophorectomy   BIOPSY  08/27/2021   Procedure: BIOPSY;  Surgeon: Rosalie Kitchens, MD;  Location: [facility] ENDOSCOPY;  Service: Gastroenterology;;   CARDIAC CATHETERIZATION  08-21-2001  dr victory sharps   normal coronary arteries and LVF   CARPAL TUNNEL RELEASE Bilateral 1986   CECALCYSTOPLASTY/  APPENDECTOMY  1984   at [facility]   CHOLECYSTECTOMY  1992   CYSTO WITH HYDRODISTENSION N/A 01/12/2016   Procedure: CYSTOSCOPY/HYDRODISTENSION;  Surgeon: Arlena Gal, MD;  Location: [facility];  Service: Urology;  Laterality: N/A;   CYSTO WITH HYDRODISTENSION N/A 08/30/2016   Procedure: CYSTOSCOPY/HYDRODISTENSION OF BLADDER INJECTION OF MARCAINE /PYRIDIUM ;  Surgeon: Gal Arlena, MD;  Location: [facility];  Service: Urology;  Laterality: N/A;   ESOPHAGOGASTRODUODENOSCOPY (EGD) WITH PROPOFOL  N/A 08/27/2021   Procedure: ESOPHAGOGASTRODUODENOSCOPY (EGD) WITH PROPOFOL ;  Surgeon: Rosalie Kitchens, MD;  Location: [facility] ENDOSCOPY;  Service: Gastroenterology;  Laterality: N/A;   LEFT HEART CATH AND CORONARY ANGIOGRAPHY N/A 09/16/2020   Procedure: LEFT HEART CATH AND CORONARY ANGIOGRAPHY;  Surgeon: Darron Deatrice LABOR, MD;   Location: [facility];  Service: Cardiovascular;  Laterality: N/A;   MULTIPLE CYSTO/  HYDRODISTENTION/  INSTILLATION THERAPY  last one 08-19-2009   NASAL SINUS SURGERY  1987   TONSILLECTOMY  as child   VAGINAL GROWTH REMOVED  as teen    FAMILY HISTORY: Family History  Problem Relation Age of Onset   CAD Father  Asthma Father    Hypertension Father    Alzheimer's disease Father    Emphysema Father    COPD Father    Heart failure Mother    Peripheral vascular disease Mother    Stroke Mother    Hypertension Mother    Colonic polyp Mother    Cancer Brother        melanoma    SOCIAL HISTORY: Social History   Socioeconomic History   Marital status: Single    Spouse name: none   Number of children: 0   Years of education: College   Highest education level: Not on file  Occupational History   Occupation: n/a  Tobacco Use   Smoking status: Never   Smokeless tobacco: Never  Vaping Use   Vaping status: Never Used  Substance and Sexual Activity   Alcohol use: No    Alcohol/week: 0.0 standard drinks of alcohol   Drug use: No   Sexual activity: Not on file  Other Topics Concern   Not on file  Social History Narrative   Lives alone   Alyse Luke Kerns, RN    Caffeine use: 2 glasses tea/day   Brother   Mother passed in ?9 at 4, father passed in 65's in 2006   She was a Runner, broadcasting/film/video      Social Drivers of Corporate investment banker Strain: Low Risk  (03/22/2022)   Overall Financial Resource Strain (CARDIA)    Difficulty of Paying Living Expenses: Not very hard  Food Insecurity: No Food Insecurity (07/19/2022)   Hunger Vital Sign    Worried About Running Out of Food in the Last Year: Never true    Ran Out of Food in the Last Year: Never true  Transportation Needs: No Transportation Needs (07/19/2022)   PRAPARE - Administrator, Civil Service (Medical): No    Lack of Transportation (Non-Medical): No  Physical Activity: Not on file  Stress: No  Stress Concern Present (03/22/2022)   [employer] of Occupational Health - Occupational Stress Questionnaire    Feeling of Stress : Only a little  Social Connections: Moderately Integrated (05/15/2021)   Social Connection and Isolation Panel    Frequency of Communication with Friends and Family: Twice a week    Frequency of Social Gatherings with Friends and Family: Twice a week    Attends Religious Services: 1 to 4 times per year    Active Member of [employer] or Organizations: Yes    Attends Banker Meetings: 1 to 4 times per year    Marital Status: Never married  Intimate Partner Violence: Not At Risk (05/15/2021)   Humiliation, Afraid, Rape, and Kick questionnaire    Fear of Current or Ex-Partner: No    Emotionally Abused: No    Physically Abused: No    Sexually Abused: No       PHYSICAL EXAM  There were no vitals filed for this visit.  There is no height or weight on file to calculate BMI.   General: The patient is well-developed and well-nourished and in no acute distress  HEENT:  Head is [place]/AT.  Sclera are anicteric.  Funduscopic exam shows normal optic discs and retinal vessels.  Neck: No carotid bruits are noted.  The neck is nontender.  Cardiovascular: The heart has a regular rate and rhythm with a normal S1 and S2. There were no murmurs, gallops or rubs.    Skin: Extremities are without rash or  edema.  Musculoskeletal:  Back is  nontender  Neurologic Exam  Mental status: The patient is alert and oriented x 3 at the time of the examination. The patient has apparent normal recent and remote memory, with an apparently normal attention span and concentration ability.   Speech is normal.  Cranial nerves: Extraocular movements are full. Pupils are equal, round, and reactive to light and accomodation.  Visual fields are full.  Facial symmetry is present. There is good facial sensation to soft touch bilaterally.Facial strength is normal.  Trapezius and  sternocleidomastoid strength is normal. No dysarthria is noted.  The tongue is midline, and the patient has symmetric elevation of the soft palate. No obvious hearing deficits are noted.  Motor:  Muscle bulk is normal.   Tone is normal. Strength is  5 / 5 in all 4 extremities.   Sensory: Sensory testing is intact to pinprick, soft touch and vibration sensation in all 4 extremities.  Coordination: Cerebellar testing reveals good finger-nose-finger and heel-to-shin bilaterally.  Gait and station: Station is normal.   Gait is normal. Tandem gait is normal. Romberg is negative.   Reflexes: Deep tendon reflexes are symmetric and normal bilaterally.   Plantar responses are flexor.    DIAGNOSTIC DATA (LABS, IMAGING, TESTING) - I reviewed patient records, labs, notes, testing and imaging myself where available.  Lab Results  Component Value Date   WBC 7.7 05/30/2023   HGB 10.8 (L) 05/30/2023   HCT 34.5 (L) 05/30/2023   MCV 89.6 05/30/2023   PLT 285 05/30/2023      Component Value Date/Time   NA 140 05/30/2023 1319   NA 142 06/08/2022 1502   K 4.5 05/30/2023 1319   CL 103 05/30/2023 1319   CO2 31 05/30/2023 1319   GLUCOSE 93 05/30/2023 1319   BUN 27 (H) 05/30/2023 1319   BUN 17 06/08/2022 1502   CREATININE 1.12 (H) 05/30/2023 1319   CALCIUM  9.1 05/30/2023 1319   PROT 7.2 05/30/2023 1319   ALBUMIN 4.2 05/30/2023 1319   AST 14 (L) 05/30/2023 1319   ALT 11 05/30/2023 1319   ALKPHOS 68 05/30/2023 1319   BILITOT 0.5 05/30/2023 1319   GFRNONAA 51 (L) 05/30/2023 1319   GFRAA 57 (L) 02/15/2017 1523   Lab Results  Component Value Date   CHOL 208 (H) 05/27/2023   HDL 52 05/27/2023   LDLCALC 128 (H) 05/27/2023   LDLDIRECT 137 (H) 03/28/2022   TRIG 159 (H) 05/27/2023   CHOLHDL 4.0 05/27/2023   No results found for: HGBA1C Lab Results  Component Value Date   VITAMINB12 1,047 (H) 05/30/2023   Lab Results  Component Value Date   TSH 1.973 01/10/2021       ASSESSMENT AND  PLAN  ***   Harout Scheurich A. Vear, MD, [facility] 11/17/2023, 3:10 PM Certified in Neurology, Clinical Neurophysiology, Sleep Medicine and Neuroimaging  [facility] [address] [place], [place] [address] [phone]

## 2023-11-20 ENCOUNTER — Ambulatory Visit: Admitting: Internal Medicine

## 2023-11-20 DIAGNOSIS — I4892 Unspecified atrial flutter: Secondary | ICD-10-CM

## 2023-11-20 DIAGNOSIS — I700 Atherosclerosis of aorta: Secondary | ICD-10-CM

## 2023-11-20 DIAGNOSIS — I25118 Atherosclerotic heart disease of native coronary artery with other forms of angina pectoris: Secondary | ICD-10-CM

## 2023-11-20 DIAGNOSIS — Z6837 Body mass index (BMI) 37.0-37.9, adult: Secondary | ICD-10-CM

## 2023-11-20 DIAGNOSIS — I350 Nonrheumatic aortic (valve) stenosis: Secondary | ICD-10-CM

## 2023-11-20 DIAGNOSIS — N1831 Chronic kidney disease, stage 3a: Secondary | ICD-10-CM

## 2023-11-20 DIAGNOSIS — D6869 Other thrombophilia: Secondary | ICD-10-CM

## 2023-11-20 DIAGNOSIS — E785 Hyperlipidemia, unspecified: Secondary | ICD-10-CM

## 2023-11-26 ENCOUNTER — Ambulatory Visit: Admitting: Neurology

## 2023-11-27 ENCOUNTER — Other Ambulatory Visit: Payer: Medicare Other

## 2023-11-27 ENCOUNTER — Ambulatory Visit: Payer: Medicare Other | Admitting: Hematology and Oncology

## 2023-12-05 NOTE — Progress Notes (Deleted)
 Patient ID: Jennifer [name] MRN: [id number] DOB/AGE: 04/28/1945 78 y.o.  Primary Care Physician:Wharton, Charmaine, [place] Primary Cardiologist: Santo  CC:  Aortic valvular disease management    FOCUSED PROBLEM LIST:   Aortic stenosis AVA 0.89, MG 35, Vmax 3.7, SVI 65, EF 60-65% TTE 2024 EKG SR without conduction defects LVH No amyloid or HOCM CMR 2024  Atrial flutter On Eliquis  CAD 90% dLAD, 50%pLAD cath 2022 Hyperlipidemia Aortic atherosclerosis Coronary CTA 2022 Hypertension CKDIIIA Chronic debilitating back pain BMI 37/BSA 2.1  7/25:   Patient consents to use of AI scribe. The patient is a 78F with the above listed medical problems referred for recommendations regarding her aortic stenosis.  She has been managed by Dr. Santo.  She underwent coronary angiography which demonstrated a high grade lesion of the distal LAD and given tortuosity of the vessel, medical management was pursued.  She did undergo CMR imaging and amyloid was excluded.  Her last echocardiogram demonstrated progression of her aortic stenosis.            Past Medical History:  Diagnosis Date   Acute meniscal tear of left knee    FOLLOWED BY DR GIAFFREY   Anemia    Aortic stenosis    Ascending aorta dilation (HCC)    Atrial flutter (HCC)    AV block, Mobitz 1    BPPV (benign paroxysmal positional vertigo)    CAD (coronary artery disease)    Chronic bladder pain    Chronic fatigue syndrome    Chronic low back pain    W/ RIGHT LEG PAIN AND NUMBNESS   Cystitis, chronic    Fibromyalgia    GERD (gastroesophageal reflux disease)    Hiatal hernia    Hypertension    IC (interstitial cystitis)    LBBB (left bundle branch block)    transient hx   OSA (obstructive sleep apnea)    per pt study yrs ago-- moderate osa ,  intolerant cpap   Pinched vertebral nerve    bilateral L2 -- L3 and L3 -- L4-/  epidural injection's treatment , PT and pain clince   PONV (postoperative nausea  and vomiting)    severe   PVC's (premature ventricular contractions)    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    Self-catheterizes urinary bladder    Spinal stenosis, lumbar region with neurogenic claudication    Wears glasses    Wears partial dentures    upper and lower    Past Surgical History:  Procedure Laterality Date   ABDOMINAL AORTOGRAM N/A 09/16/2020   Procedure: ABDOMINAL AORTOGRAM;  Surgeon: Darron Deatrice LABOR, MD;  Location: [facility];  Service: Cardiovascular;  Laterality: N/A;   ABDOMINAL HYSTERECTOMY  1980   w/ Left Salpingoophorectomy   BIOPSY  08/27/2021   Procedure: BIOPSY;  Surgeon: Rosalie Kitchens, MD;  Location: [facility] ENDOSCOPY;  Service: Gastroenterology;;   CARDIAC CATHETERIZATION  08-21-2001  dr victory sharps   normal coronary arteries and LVF   CARPAL TUNNEL RELEASE Bilateral 1986   CECALCYSTOPLASTY/  APPENDECTOMY  1984   at [facility]   CHOLECYSTECTOMY  1992   CYSTO WITH HYDRODISTENSION N/A 01/12/2016   Procedure: CYSTOSCOPY/HYDRODISTENSION;  Surgeon: Arlena Gal, MD;  Location: [facility];  Service: Urology;  Laterality: N/A;   CYSTO WITH HYDRODISTENSION N/A 08/30/2016   Procedure: CYSTOSCOPY/HYDRODISTENSION OF BLADDER INJECTION OF MARCAINE /PYRIDIUM ;  Surgeon: Gal Arlena, MD;  Location: [facility];  Service: Urology;  Laterality: N/A;   ESOPHAGOGASTRODUODENOSCOPY (EGD) WITH PROPOFOL  N/A 08/27/2021   Procedure: ESOPHAGOGASTRODUODENOSCOPY (EGD) WITH PROPOFOL ;  Surgeon: Rosalie Kitchens, MD;  Location: [facility] ENDOSCOPY;  Service: Gastroenterology;  Laterality: N/A;   LEFT HEART CATH AND CORONARY ANGIOGRAPHY N/A 09/16/2020   Procedure: LEFT HEART CATH AND CORONARY ANGIOGRAPHY;  Surgeon: Darron Deatrice LABOR, MD;  Location: [facility];  Service: Cardiovascular;  Laterality: N/A;   MULTIPLE CYSTO/  HYDRODISTENTION/  INSTILLATION THERAPY  last one 08-19-2009   NASAL SINUS SURGERY  1987    TONSILLECTOMY  as child   VAGINAL GROWTH REMOVED  as teen    Family History  Problem Relation Age of Onset   CAD Father    Asthma Father    Hypertension Father    Alzheimer's disease Father    Emphysema Father    COPD Father    Heart failure Mother    Peripheral vascular disease Mother    Stroke Mother    Hypertension Mother    Colonic polyp Mother    Cancer Brother        melanoma    Social History   Socioeconomic History   Marital status: Single    Spouse name: none   Number of children: 0   Years of education: College   Highest education level: Not on file  Occupational History   Occupation: n/a  Tobacco Use   Smoking status: Never   Smokeless tobacco: Never  Vaping Use   Vaping status: Never Used  Substance and Sexual Activity   Alcohol use: No    Alcohol/week: 0.0 standard drinks of alcohol   Drug use: No   Sexual activity: Not on file  Other Topics Concern   Not on file  Social History Narrative   Lives alone   Jennifer Luke Kerns, RN    Caffeine use: 2 glasses tea/day   Brother   Mother passed in ?71 at 52, father passed in 60's in 2006   She was a Runner, broadcasting/film/video      Social Drivers of Corporate investment banker Strain: Low Risk  (03/22/2022)   Overall Financial Resource Strain (CARDIA)    Difficulty of Paying Living Expenses: Not very hard  Food Insecurity: No Food Insecurity (07/19/2022)   Hunger Vital Sign    Worried About Running Out of Food in the Last Year: Never true    Ran Out of Food in the Last Year: Never true  Transportation Needs: No Transportation Needs (07/19/2022)   PRAPARE - Administrator, Civil Service (Medical): No    Lack of Transportation (Non-Medical): No  Physical Activity: Not on file  Stress: No Stress Concern Present (03/22/2022)   [employer] of Occupational Health - Occupational Stress Questionnaire    Feeling of Stress : Only a little  Social Connections: Moderately Integrated (05/15/2021)   Social  Connection and Isolation Panel    Frequency of Communication with Friends and Family: Twice a week    Frequency of Social Gatherings with Friends and Family: Twice a week    Attends Religious Services: 1 to 4 times per year    Active Member of [employer] or Organizations: Yes    Attends Banker Meetings: 1 to 4 times per year    Marital Status: Never married  Intimate Partner Violence: Not At Risk (05/15/2021)   Humiliation, Afraid, Rape, and Kick questionnaire    Fear of Current or Ex-Partner: No    Emotionally Abused: No    Physically Abused: No  Sexually Abused: No     Prior to Admission medications   Medication Sig Start Date End Date Taking? Authorizing Provider  acetaminophen  (TYLENOL ) 500 MG tablet Take 500 mg by mouth 2 (two) times daily as needed (for pain).    [provider]  aluminum hydroxide-magnesium  carbonate (GAVISCON) 95-358 MG/15ML SUSP Take by mouth as needed for indigestion or heartburn. 01/23/17   [provider]  apixaban  (ELIQUIS ) 5 MG TABS tablet Take 1 tablet (5 mg total) by mouth 2 (two) times daily. 09/11/23   Santo Stanly LABOR, MD  cyanocobalamin  (VITAMIN B12) 1000 MCG tablet Take 1,000 mcg by mouth daily. 07/12/22   [provider]  dextromethorphan -guaiFENesin  (MUCINEX  DM) 30-600 MG 12hr tablet Take 1 tablet by mouth every 12 (twelve) hours as needed for cough.    [provider]  ezetimibe  (ZETIA ) 10 MG tablet Take 1 tablet (10 mg total) by mouth daily. 06/10/23   Chandrasekhar, Stanly LABOR, MD  fexofenadine (ALLEGRA) 60 MG tablet Take 60 mg by mouth daily as needed for allergies. 01/06/14   [provider]  fluconazole  (DIFLUCAN ) 100 MG tablet Take 100 mg by mouth daily. 05/14/23   [provider]  furosemide  (LASIX ) 40 MG tablet May take an extra dose once daily as needed for swelling 05/27/23   Chandrasekhar, Mahesh A, MD  gabapentin  (NEURONTIN ) 300 MG capsule Take 300 mg by mouth at bedtime.  07/28/21   [provider]  hydrALAZINE  (APRESOLINE ) 50 MG tablet TAKE 1 TABLET BY MOUTH THREE TIMES A DAY 06/07/23   Chandrasekhar, Mahesh A, MD  HYDROcodone -acetaminophen  (NORCO) 7.5-325 MG tablet Take 1 tablet by mouth 3 (three) times daily as needed.    [provider]  hydrocortisone  (ANUSOL -HC) 25 MG suppository Place 25 mg rectally 2 (two) times daily as needed for hemorrhoids. 12/07/20   [provider]  isosorbide  dinitrate (ISORDIL ) 10 MG tablet TAKE 1 TABLET BY MOUTH THREE TIMES A DAY 06/07/23   Chandrasekhar, Mahesh A, MD  losartan  (COZAAR ) 100 MG tablet TAKE 1 TABLET BY MOUTH EVERY DAY 08/15/23   Chandrasekhar, Mahesh A, MD  meclizine  (ANTIVERT ) 25 MG tablet Take 25 mg by mouth 3 (three) times daily as needed for dizziness.    [provider]  Methen-Hyosc-Meth Blue-Na Phos (UROGESIC-BLUE) 81.6 MG TABS Take 1 tablet by mouth every 6 (six) hours as needed (urinary burning). 08/21/21   [provider]  metoprolol  tartrate (LOPRESSOR ) 25 MG tablet TAKE 1 TABLET BY MOUTH TWICE A DAY 08/15/23   Chandrasekhar, Mahesh A, MD  nitroGLYCERIN  (NITROSTAT ) 0.4 MG SL tablet PLACE 1 TABLET UNDER THE TONGE EVERY 5 MINUTES AS NEEDED FOR CHEST PAIN *TAKE UP TO 3 TABLETS* 02/21/23   Chandrasekhar, Mahesh A, MD  nystatin  cream (MYCOSTATIN ) Apply topically 2 (two) times daily. 08/04/20   Eben Reyes BROCKS, MD  ondansetron  (ZOFRAN -ODT) 4 MG disintegrating tablet Take 4 mg by mouth every 8 (eight) hours as needed for nausea (dissolve orally). 07/01/17   [provider]  pantoprazole  (PROTONIX ) 40 MG tablet Take 40 mg by mouth 2 (two) times daily. 04/15/23   [provider]  polyethylene glycol powder (GLYCOLAX/MIRALAX) 17 GM/SCOOP powder Take 17 g by mouth daily as needed for mild constipation.    [provider]  rosuvastatin  (CRESTOR ) 5 MG tablet Take 1 tablet (5 mg total) by mouth 3 (three) times a week. 08/03/22 05/27/23  Santo Stanly LABOR, MD   senna (SENOKOT) 8.6 MG tablet Take 1 tablet by mouth as needed for constipation.  07/28/21   [provider]  spironolactone  (ALDACTONE ) 25 MG tablet Take 1 tablet (25 mg total) by mouth daily. 09/17/23   Santo Stanly LABOR, MD  sucralfate  (CARAFATE ) 1 g tablet Take 1 tablet (1 g total) by mouth 4 (four) times daily -  with meals and at bedtime. Patient taking differently: Take 1 g by mouth daily at 6 (six) AM. 1 tab in am, 1 tab in pm 08/28/21   Patsy Lenis, MD  triamcinolone  cream (KENALOG ) 0.1 % Apply 1 application. topically 2 (two) times daily as needed (rash). 04/06/21   [provider]    Allergies  Allergen Reactions   Moxifloxacin Hives, Shortness Of Breath, Swelling and Rash    Rash was severe, caused tremors, ans skin became BLOODY RED   Quinolones Anaphylaxis   Sulfa Antibiotics Hives and Rash   Furosemide  Other (See Comments)    CANNOT TAKE DUE TO Interstitial cystitis   Aspirin  Nausea Only and Other (See Comments)    GI and stomach upset   Doxazosin      constipation   Duloxetine     Other reaction(s): Other (See Comments)   Duloxetine Hcl Nausea Only and Other (See Comments)    Vertigo and heart racing also    Hydrochlorothiazide Other (See Comments)    Headaches    Ibuprofen Nausea Only and Other (See Comments)    GI upset   Nebivolol  Hcl     Other reaction(s): extreme fatigue   Other Other (See Comments)   Ranexa  [Ranolazine ]     Constipation/heart pounding   Robaxin [Methocarbamol] Other (See Comments)    Make me feel flu-like symptoms   Topiramate Itching   Xarelto  [Rivaroxaban ]     Back pain   Zofran  [Ondansetron  Hcl] Other (See Comments)    Headaches    Codeine Hives, Nausea And Vomiting and Rash   Dimethyl Sulfoxide Hives and Itching   Macrodantin  [Nitrofurantoin  Macrocrystal] Itching, Nausea And Vomiting and Rash    Abdominal and chest pain, also- blood-red skin, also   Nitrofurantoin  Diarrhea, Itching, Rash and Other (See  Comments)    GI/stomach upset, sweating, chest pain, and skin turned bloody red   Tape Rash   Yellow Dye #6 (Sunset Yellow) Rash    REVIEW OF SYSTEMS:  General: no fevers/chills/night sweats Eyes: no blurry vision, diplopia, or amaurosis ENT: no sore throat or hearing loss Resp: no cough, wheezing, or hemoptysis CV: no edema or palpitations GI: no abdominal pain, nausea, vomiting, diarrhea, or constipation GU: no dysuria, frequency, or hematuria Skin: no rash Neuro: no headache, numbness, tingling, or weakness of extremities Musculoskeletal: no joint pain or swelling Heme: no bleeding, DVT, or easy bruising Endo: no polydipsia or polyuria  There were no vitals taken for this visit.  PHYSICAL EXAM: GEN:  AO x 3 in no acute distress HEENT: normal Dentition: Normal*** Neck: JVP normal. +2***carotid upstrokes without bruits. No thyromegaly. Lungs: equal expansion, clear bilaterally CV: Apex is discrete and nondisplaced, RRR without murmur or gallop*** Abd: soft, non-tender, non-distended; no bruit; positive bowel sounds Ext: no edema, ecchymoses, or cyanosis Vascular: 2+ femoral pulses, 2+ radial pulses       Skin: warm and dry without rash Neuro: CN II-XII grossly intact; motor and sensory grossly intact    DATA AND STUDIES:  EKG:  EKG Interpretation Date/Time:    Ventricular Rate:    PR Interval:    QRS Duration:    QT Interval:    QTC Calculation:   R Axis:  Text Interpretation:          05/30/2023: ALT 11; BUN 27; Creatinine 1.12; Hemoglobin 10.8; Platelet Count 285; Potassium 4.5; Sodium 140   STS RISK CALCULATOR: Pending  NHYA CLASS: ***    ASSESSMENT AND PLAN:   1. Nonrheumatic aortic valve stenosis   2. Atrial flutter, unspecified type (HCC)   3. Secondary hypercoagulable state (HCC)   4. Coronary artery disease of native artery of native heart with stable angina pectoris (HCC)   5. Primary hypertension   6. Hyperlipidemia LDL goal <70    7. Aortic atherosclerosis (HCC)   8. BMI 37.0-37.9, adult       Aortic stenosis: I reviewed her echocardiogram from November.  This demonstrates a thickened aortic valve with restricted motion.  Her mean gradient was consistent with moderate aortic stenosis.  I calculated her stroke-volume index which was 65 mL/m.  I will obtain another echocardiogram to determine whether she still has moderate aortic stenosis or has progressed.*** Atrial flutter: Continue Eliquis  5 mg twice daily and Lopressor  25 mg twice daily Secondary hypercoagulable state: Continue Eliquis  5 mg twice daily Coronary artery disease: Continue Eliquis  5 mg twice daily, rosuvastatin  5 mg 3 times a week, and Lopressor  25 mg daily.-Aortic intervention is pursued then the patient will require coronary angiography. Hypertension: Continue hydralazine  50 mg 3 times daily, Isordil  10 mg 3 times daily, losartan  100 mg daily, Lopressor  25 mg twice daily, spironolactone  25 mg daily*** Hyperlipidemia: Continue rosuvastatin  5 mg 3 times a week Aortic atherosclerosis: Continue Eliquis  5 mg twice daily and rosuvastatin  5 mg 3 times a week CKD stage IIIa: Continue losartan  100 mg. Elevated BMI/BSA: Diet and exercise modification; may have an effect on valve type if a TAVR is ultimately pursued.   I have personally reviewed the patients imaging data as summarized above.  I have reviewed the natural history of aortic stenosis with the patient and family members who are present today. We have discussed the limitations of medical therapy and the poor prognosis associated with symptomatic aortic stenosis. We have also reviewed potential treatment options, including palliative medical therapy, conventional surgical aortic valve replacement, and transcatheter aortic valve replacement. We discussed treatment options in the context of this patient's specific comorbid medical conditions.   All of the patient's questions were answered today. Will make  further recommendations based on the results of studies outlined above.   I spent *** minutes reviewing all clinical data during and prior to this visit including all relevant imaging studies, laboratories, clinical information from other health systems and prior notes from both Cardiology and other specialties, interviewing the patient, conducting a complete physical examination, and coordinating care in order to formulate a comprehensive and personalized evaluation and treatment plan.   Marquese Burkland K Azalea Cedar, MD  12/05/2023 1:17 PM    [facility] [address] [place], [place], [place]  [address] Phone: [phone]; Fax: [phone]

## 2023-12-06 ENCOUNTER — Emergency Department (HOSPITAL_COMMUNITY)

## 2023-12-06 ENCOUNTER — Other Ambulatory Visit: Payer: Self-pay

## 2023-12-06 ENCOUNTER — Encounter (HOSPITAL_COMMUNITY): Payer: Self-pay | Admitting: *Deleted

## 2023-12-06 ENCOUNTER — Inpatient Hospital Stay (HOSPITAL_COMMUNITY)
Admission: EM | Admit: 2023-12-06 | Discharge: 2023-12-13 | DRG: 493 | Disposition: A | Discharge status: Skilled Nursing Facility | Attending: Internal Medicine | Admitting: Internal Medicine

## 2023-12-06 DIAGNOSIS — Z9049 Acquired absence of other specified parts of digestive tract: Secondary | ICD-10-CM

## 2023-12-06 DIAGNOSIS — D649 Anemia, unspecified: Secondary | ICD-10-CM | POA: Diagnosis not present

## 2023-12-06 DIAGNOSIS — S0011XA Contusion of right eyelid and periocular area, initial encounter: Secondary | ICD-10-CM | POA: Diagnosis present

## 2023-12-06 DIAGNOSIS — Z7901 Long term (current) use of anticoagulants: Secondary | ICD-10-CM

## 2023-12-06 DIAGNOSIS — S42341A Displaced spiral fracture of shaft of humerus, right arm, initial encounter for closed fracture: Secondary | ICD-10-CM | POA: Diagnosis present

## 2023-12-06 DIAGNOSIS — I272 Pulmonary hypertension, unspecified: Secondary | ICD-10-CM | POA: Diagnosis present

## 2023-12-06 DIAGNOSIS — B961 Klebsiella pneumoniae [K. pneumoniae] as the cause of diseases classified elsewhere: Secondary | ICD-10-CM | POA: Diagnosis present

## 2023-12-06 DIAGNOSIS — I10 Essential (primary) hypertension: Secondary | ICD-10-CM | POA: Diagnosis not present

## 2023-12-06 DIAGNOSIS — S42351A Displaced comminuted fracture of shaft of humerus, right arm, initial encounter for closed fracture: Secondary | ICD-10-CM | POA: Diagnosis present

## 2023-12-06 DIAGNOSIS — Z8249 Family history of ischemic heart disease and other diseases of the circulatory system: Secondary | ICD-10-CM

## 2023-12-06 DIAGNOSIS — Z1624 Resistance to multiple antibiotics: Secondary | ICD-10-CM | POA: Diagnosis present

## 2023-12-06 DIAGNOSIS — N3090 Cystitis, unspecified without hematuria: Secondary | ICD-10-CM | POA: Diagnosis not present

## 2023-12-06 DIAGNOSIS — Z90710 Acquired absence of both cervix and uterus: Secondary | ICD-10-CM

## 2023-12-06 DIAGNOSIS — S199XXA Unspecified injury of neck, initial encounter: Secondary | ICD-10-CM | POA: Diagnosis not present

## 2023-12-06 DIAGNOSIS — S42301A Unspecified fracture of shaft of humerus, right arm, initial encounter for closed fracture: Secondary | ICD-10-CM | POA: Diagnosis present

## 2023-12-06 DIAGNOSIS — I5032 Chronic diastolic (congestive) heart failure: Secondary | ICD-10-CM | POA: Diagnosis present

## 2023-12-06 DIAGNOSIS — N3010 Interstitial cystitis (chronic) without hematuria: Secondary | ICD-10-CM | POA: Diagnosis present

## 2023-12-06 DIAGNOSIS — M5431 Sciatica, right side: Secondary | ICD-10-CM | POA: Diagnosis present

## 2023-12-06 DIAGNOSIS — E859 Amyloidosis, unspecified: Secondary | ICD-10-CM | POA: Diagnosis present

## 2023-12-06 DIAGNOSIS — E669 Obesity, unspecified: Secondary | ICD-10-CM | POA: Diagnosis present

## 2023-12-06 DIAGNOSIS — W19XXXA Unspecified fall, initial encounter: Secondary | ICD-10-CM

## 2023-12-06 DIAGNOSIS — G629 Polyneuropathy, unspecified: Secondary | ICD-10-CM | POA: Diagnosis present

## 2023-12-06 DIAGNOSIS — M4802 Spinal stenosis, cervical region: Secondary | ICD-10-CM | POA: Diagnosis not present

## 2023-12-06 DIAGNOSIS — Z952 Presence of prosthetic heart valve: Secondary | ICD-10-CM

## 2023-12-06 DIAGNOSIS — E66812 Obesity, class 2: Secondary | ICD-10-CM | POA: Diagnosis present

## 2023-12-06 DIAGNOSIS — R110 Nausea: Secondary | ICD-10-CM | POA: Diagnosis not present

## 2023-12-06 DIAGNOSIS — W1830XA Fall on same level, unspecified, initial encounter: Secondary | ICD-10-CM | POA: Diagnosis present

## 2023-12-06 DIAGNOSIS — I110 Hypertensive heart disease with heart failure: Secondary | ICD-10-CM | POA: Diagnosis present

## 2023-12-06 DIAGNOSIS — S3993XA Unspecified injury of pelvis, initial encounter: Secondary | ICD-10-CM | POA: Diagnosis not present

## 2023-12-06 DIAGNOSIS — Z888 Allergy status to other drugs, medicaments and biological substances status: Secondary | ICD-10-CM

## 2023-12-06 DIAGNOSIS — M797 Fibromyalgia: Secondary | ICD-10-CM | POA: Diagnosis present

## 2023-12-06 DIAGNOSIS — M48061 Spinal stenosis, lumbar region without neurogenic claudication: Secondary | ICD-10-CM | POA: Diagnosis present

## 2023-12-06 DIAGNOSIS — S42212A Unspecified displaced fracture of surgical neck of left humerus, initial encounter for closed fracture: Secondary | ICD-10-CM | POA: Diagnosis not present

## 2023-12-06 DIAGNOSIS — G4489 Other headache syndrome: Secondary | ICD-10-CM | POA: Diagnosis not present

## 2023-12-06 DIAGNOSIS — Z823 Family history of stroke: Secondary | ICD-10-CM

## 2023-12-06 DIAGNOSIS — Z79890 Hormone replacement therapy (postmenopausal): Secondary | ICD-10-CM

## 2023-12-06 DIAGNOSIS — Z01810 Encounter for preprocedural cardiovascular examination: Secondary | ICD-10-CM | POA: Diagnosis not present

## 2023-12-06 DIAGNOSIS — E559 Vitamin D deficiency, unspecified: Secondary | ICD-10-CM | POA: Diagnosis not present

## 2023-12-06 DIAGNOSIS — T45515A Adverse effect of anticoagulants, initial encounter: Secondary | ICD-10-CM | POA: Diagnosis present

## 2023-12-06 DIAGNOSIS — Z808 Family history of malignant neoplasm of other organs or systems: Secondary | ICD-10-CM

## 2023-12-06 DIAGNOSIS — M898X9 Other specified disorders of bone, unspecified site: Secondary | ICD-10-CM | POA: Diagnosis present

## 2023-12-06 DIAGNOSIS — Z882 Allergy status to sulfonamides status: Secondary | ICD-10-CM

## 2023-12-06 DIAGNOSIS — N3020 Other chronic cystitis without hematuria: Secondary | ICD-10-CM | POA: Diagnosis present

## 2023-12-06 DIAGNOSIS — Z885 Allergy status to narcotic agent status: Secondary | ICD-10-CM

## 2023-12-06 DIAGNOSIS — S42211A Unspecified displaced fracture of surgical neck of right humerus, initial encounter for closed fracture: Secondary | ICD-10-CM | POA: Diagnosis not present

## 2023-12-06 DIAGNOSIS — I4892 Unspecified atrial flutter: Secondary | ICD-10-CM | POA: Diagnosis present

## 2023-12-06 DIAGNOSIS — S42201A Unspecified fracture of upper end of right humerus, initial encounter for closed fracture: Secondary | ICD-10-CM | POA: Diagnosis present

## 2023-12-06 DIAGNOSIS — K589 Irritable bowel syndrome without diarrhea: Secondary | ICD-10-CM | POA: Diagnosis not present

## 2023-12-06 DIAGNOSIS — I2510 Atherosclerotic heart disease of native coronary artery without angina pectoris: Secondary | ICD-10-CM | POA: Diagnosis present

## 2023-12-06 DIAGNOSIS — S299XXA Unspecified injury of thorax, initial encounter: Secondary | ICD-10-CM | POA: Diagnosis not present

## 2023-12-06 DIAGNOSIS — J952 Acute pulmonary insufficiency following nonthoracic surgery: Secondary | ICD-10-CM | POA: Diagnosis not present

## 2023-12-06 DIAGNOSIS — H8113 Benign paroxysmal vertigo, bilateral: Secondary | ICD-10-CM | POA: Diagnosis present

## 2023-12-06 DIAGNOSIS — I43 Cardiomyopathy in diseases classified elsewhere: Secondary | ICD-10-CM | POA: Diagnosis not present

## 2023-12-06 DIAGNOSIS — I350 Nonrheumatic aortic (valve) stenosis: Secondary | ICD-10-CM | POA: Diagnosis not present

## 2023-12-06 DIAGNOSIS — M5030 Other cervical disc degeneration, unspecified cervical region: Secondary | ICD-10-CM | POA: Diagnosis not present

## 2023-12-06 DIAGNOSIS — I480 Paroxysmal atrial fibrillation: Secondary | ICD-10-CM | POA: Diagnosis not present

## 2023-12-06 DIAGNOSIS — G8929 Other chronic pain: Secondary | ICD-10-CM | POA: Diagnosis present

## 2023-12-06 DIAGNOSIS — Z820 Family history of epilepsy and other diseases of the nervous system: Secondary | ICD-10-CM

## 2023-12-06 DIAGNOSIS — Z743 Need for continuous supervision: Secondary | ICD-10-CM | POA: Diagnosis not present

## 2023-12-06 DIAGNOSIS — D62 Acute posthemorrhagic anemia: Secondary | ICD-10-CM | POA: Diagnosis present

## 2023-12-06 DIAGNOSIS — M4986 Spondylopathy in diseases classified elsewhere, lumbar region: Secondary | ICD-10-CM | POA: Diagnosis not present

## 2023-12-06 DIAGNOSIS — Z91048 Other nonmedicinal substance allergy status: Secondary | ICD-10-CM

## 2023-12-06 DIAGNOSIS — I4891 Unspecified atrial fibrillation: Secondary | ICD-10-CM | POA: Diagnosis present

## 2023-12-06 DIAGNOSIS — Y92009 Unspecified place in unspecified non-institutional (private) residence as the place of occurrence of the external cause: Secondary | ICD-10-CM

## 2023-12-06 DIAGNOSIS — R278 Other lack of coordination: Secondary | ICD-10-CM | POA: Diagnosis not present

## 2023-12-06 DIAGNOSIS — M6281 Muscle weakness (generalized): Secondary | ICD-10-CM | POA: Diagnosis not present

## 2023-12-06 DIAGNOSIS — Z23 Encounter for immunization: Secondary | ICD-10-CM

## 2023-12-06 DIAGNOSIS — R42 Dizziness and giddiness: Secondary | ICD-10-CM | POA: Diagnosis not present

## 2023-12-06 DIAGNOSIS — E538 Deficiency of other specified B group vitamins: Secondary | ICD-10-CM | POA: Diagnosis not present

## 2023-12-06 DIAGNOSIS — T8484XD Pain due to internal orthopedic prosthetic devices, implants and grafts, subsequent encounter: Secondary | ICD-10-CM | POA: Diagnosis not present

## 2023-12-06 DIAGNOSIS — S4991XA Unspecified injury of right shoulder and upper arm, initial encounter: Secondary | ICD-10-CM | POA: Diagnosis not present

## 2023-12-06 DIAGNOSIS — S42351D Displaced comminuted fracture of shaft of humerus, right arm, subsequent encounter for fracture with routine healing: Secondary | ICD-10-CM | POA: Diagnosis not present

## 2023-12-06 DIAGNOSIS — R531 Weakness: Secondary | ICD-10-CM | POA: Diagnosis not present

## 2023-12-06 DIAGNOSIS — Z6838 Body mass index (BMI) 38.0-38.9, adult: Secondary | ICD-10-CM

## 2023-12-06 DIAGNOSIS — E039 Hypothyroidism, unspecified: Secondary | ICD-10-CM | POA: Diagnosis present

## 2023-12-06 DIAGNOSIS — S42201D Unspecified fracture of upper end of right humerus, subsequent encounter for fracture with routine healing: Secondary | ICD-10-CM | POA: Diagnosis not present

## 2023-12-06 DIAGNOSIS — R2689 Other abnormalities of gait and mobility: Secondary | ICD-10-CM | POA: Diagnosis not present

## 2023-12-06 DIAGNOSIS — S0990XA Unspecified injury of head, initial encounter: Secondary | ICD-10-CM | POA: Diagnosis not present

## 2023-12-06 DIAGNOSIS — Z9181 History of falling: Secondary | ICD-10-CM | POA: Diagnosis not present

## 2023-12-06 DIAGNOSIS — Z83719 Family history of colon polyps, unspecified: Secondary | ICD-10-CM

## 2023-12-06 DIAGNOSIS — N179 Acute kidney failure, unspecified: Secondary | ICD-10-CM | POA: Diagnosis present

## 2023-12-06 DIAGNOSIS — S0012XA Contusion of left eyelid and periocular area, initial encounter: Secondary | ICD-10-CM | POA: Diagnosis present

## 2023-12-06 DIAGNOSIS — D6832 Hemorrhagic disorder due to extrinsic circulating anticoagulants: Secondary | ICD-10-CM | POA: Diagnosis present

## 2023-12-06 DIAGNOSIS — Z825 Family history of asthma and other chronic lower respiratory diseases: Secondary | ICD-10-CM

## 2023-12-06 DIAGNOSIS — M5432 Sciatica, left side: Secondary | ICD-10-CM | POA: Diagnosis present

## 2023-12-06 DIAGNOSIS — S42291D Other displaced fracture of upper end of right humerus, subsequent encounter for fracture with routine healing: Secondary | ICD-10-CM | POA: Diagnosis not present

## 2023-12-06 DIAGNOSIS — Z87892 Personal history of anaphylaxis: Secondary | ICD-10-CM

## 2023-12-06 DIAGNOSIS — S3991XA Unspecified injury of abdomen, initial encounter: Secondary | ICD-10-CM | POA: Diagnosis not present

## 2023-12-06 LAB — COMPREHENSIVE METABOLIC PANEL WITH GFR
ALT: 37 U/L (ref 0–44)
AST: 71 U/L — ABNORMAL HIGH (ref 15–41)
Albumin: 3.7 g/dL (ref 3.5–5.0)
Alkaline Phosphatase: 52 U/L (ref 38–126)
Anion gap: 13 (ref 5–15)
BUN: 36 mg/dL — ABNORMAL HIGH (ref 8–23)
CO2: 24 mmol/L (ref 22–32)
Calcium: 8.7 mg/dL — ABNORMAL LOW (ref 8.9–10.3)
Chloride: 101 mmol/L (ref 98–111)
Creatinine, Ser: 1.5 mg/dL — ABNORMAL HIGH (ref 0.44–1.00)
GFR, Estimated: 36 mL/min — ABNORMAL LOW
Glucose, Bld: 121 mg/dL — ABNORMAL HIGH (ref 70–99)
Potassium: 4.4 mmol/L (ref 3.5–5.1)
Sodium: 138 mmol/L (ref 135–145)
Total Bilirubin: 1.6 mg/dL — ABNORMAL HIGH (ref 0.0–1.2)
Total Protein: 6.6 g/dL (ref 6.5–8.1)

## 2023-12-06 LAB — CBC WITH DIFFERENTIAL/PLATELET
Abs Immature Granulocytes: 0.12 K/uL — ABNORMAL HIGH (ref 0.00–0.07)
Basophils Absolute: 0 K/uL (ref 0.0–0.1)
Basophils Relative: 0 %
Eosinophils Absolute: 0 K/uL (ref 0.0–0.5)
Eosinophils Relative: 0 %
HCT: 25.8 % — ABNORMAL LOW (ref 36.0–46.0)
Hemoglobin: 8.2 g/dL — ABNORMAL LOW (ref 12.0–15.0)
Immature Granulocytes: 1 %
Lymphocytes Relative: 8 %
Lymphs Abs: 1 K/uL (ref 0.7–4.0)
MCH: 29.4 pg (ref 26.0–34.0)
MCHC: 31.8 g/dL (ref 30.0–36.0)
MCV: 92.5 fL (ref 80.0–100.0)
Monocytes Absolute: 1.3 K/uL — ABNORMAL HIGH (ref 0.1–1.0)
Monocytes Relative: 9 %
Neutro Abs: 11.4 K/uL — ABNORMAL HIGH (ref 1.7–7.7)
Neutrophils Relative %: 82 %
Platelets: 256 K/uL (ref 150–400)
RBC: 2.79 MIL/uL — ABNORMAL LOW (ref 3.87–5.11)
RDW: 13.2 % (ref 11.5–15.5)
WBC: 13.8 K/uL — ABNORMAL HIGH (ref 4.0–10.5)
nRBC: 0.2 % (ref 0.0–0.2)

## 2023-12-06 LAB — PROTIME-INR
INR: 1.6 — ABNORMAL HIGH (ref 0.8–1.2)
Prothrombin Time: 19.9 s — ABNORMAL HIGH (ref 11.4–15.2)

## 2023-12-06 MED ORDER — MORPHINE SULFATE (PF) 4 MG/ML IV SOLN
4.0000 mg | Freq: Once | INTRAVENOUS | Status: AC
  Administered 2023-12-06: 4 mg via INTRAVENOUS
  Filled 2023-12-06: qty 1

## 2023-12-06 MED ORDER — METOCLOPRAMIDE HCL 5 MG/ML IJ SOLN
10.0000 mg | Freq: Once | INTRAMUSCULAR | Status: AC
  Administered 2023-12-06: 10 mg via INTRAVENOUS
  Filled 2023-12-06: qty 2

## 2023-12-06 NOTE — ED Provider Notes (Addendum)
 [place] EMERGENCY DEPARTMENT AT [facility] Provider Note   CSN: [id number] Arrival date & time: 12/06/23  2147     Patient presents with: Jennifer [name]   Jennifer [name] is a 78 y.o. female.  Patient with past medical history significant for fibromyalgia, interstitial cystitis, HTN, vertigo, chronic fatigue, diastolic CHF, polypharmacy presents to the emergency department complaining of injury secondary to fall.  Patient states that on Wednesday night she had taken her prescribed oxycodone  and gabapentin  and was sitting in her chair at approximately 9 PM.  Someone came to her door and she stood up rapidly.  She states that she normally has to give herself a minute or more due to medication side effects together her balance before moving after standing and due to the person knocking at the door she tried to remove rapidly.  She instantly became dizzy and fell, hitting the wall and windowsill.  The patient does take Eliquis  at baseline.  Patient also takes Lasix .  She states she has not been able to take her Lasix  or Eliquis  since the injury due to difficulty opening the bottle.  She has a right shoulder injury and is right-hand dominant.  Patient also has significant bruising.  Upon my initial assessment patient was noted to have raccoon eyes and significant bruising across the upper chest.  She states she cannot move her right arm due to pain.  She denies losing consciousness.  She endorses some nausea which she thinks is due to to pain.    Fall       Prior to Admission medications   Medication Sig Start Date End Date Taking? Authorizing Provider  acetaminophen  (TYLENOL ) 500 MG tablet Take 500 mg by mouth 2 (two) times daily as needed (for pain).    [provider]  aluminum hydroxide-magnesium  carbonate (GAVISCON) 95-358 MG/15ML SUSP Take by mouth as needed for indigestion or heartburn. 01/23/17   [provider]  apixaban  (ELIQUIS ) 5 MG TABS tablet Take 1 tablet (5  mg total) by mouth 2 (two) times daily. 09/11/23   Santo Stanly LABOR, MD  cyanocobalamin  (VITAMIN B12) 1000 MCG tablet Take 1,000 mcg by mouth daily. 07/12/22   [provider]  dextromethorphan -guaiFENesin  (MUCINEX  DM) 30-600 MG 12hr tablet Take 1 tablet by mouth every 12 (twelve) hours as needed for cough.    [provider]  ezetimibe  (ZETIA ) 10 MG tablet Take 1 tablet (10 mg total) by mouth daily. 06/10/23   Chandrasekhar, Stanly LABOR, MD  fexofenadine (ALLEGRA) 60 MG tablet Take 60 mg by mouth daily as needed for allergies. 01/06/14   [provider]  fluconazole  (DIFLUCAN ) 100 MG tablet Take 100 mg by mouth daily. 05/14/23   [provider]  furosemide  (LASIX ) 40 MG tablet May take an extra dose once daily as needed for swelling 05/27/23   Chandrasekhar, Mahesh A, MD  gabapentin  (NEURONTIN ) 300 MG capsule Take 300 mg by mouth at bedtime. 07/28/21   [provider]  hydrALAZINE  (APRESOLINE ) 50 MG tablet TAKE 1 TABLET BY MOUTH THREE TIMES A DAY 06/07/23   Chandrasekhar, Mahesh A, MD  HYDROcodone -acetaminophen  (NORCO) 7.5-325 MG tablet Take 1 tablet by mouth 3 (three) times daily as needed.    [provider]  hydrocortisone  (ANUSOL -HC) 25 MG suppository Place 25 mg rectally 2 (two) times daily as needed for hemorrhoids. 12/07/20   [provider]  isosorbide  dinitrate (ISORDIL ) 10 MG tablet TAKE 1 TABLET BY MOUTH THREE TIMES A DAY 06/07/23   Chandrasekhar, Mahesh A,  MD  losartan  (COZAAR ) 100 MG tablet TAKE 1 TABLET BY MOUTH EVERY DAY 08/15/23   Chandrasekhar, Mahesh A, MD  meclizine  (ANTIVERT ) 25 MG tablet Take 25 mg by mouth 3 (three) times daily as needed for dizziness.    [provider]  Methen-Hyosc-Meth Blue-Na Phos (UROGESIC-BLUE) 81.6 MG TABS Take 1 tablet by mouth every 6 (six) hours as needed (urinary burning). 08/21/21   [provider]  metoprolol  tartrate (LOPRESSOR ) 25 MG tablet TAKE 1 TABLET BY MOUTH TWICE A DAY  08/15/23   Chandrasekhar, Mahesh A, MD  nitroGLYCERIN  (NITROSTAT ) 0.4 MG SL tablet PLACE 1 TABLET UNDER THE TONGE EVERY 5 MINUTES AS NEEDED FOR CHEST PAIN *TAKE UP TO 3 TABLETS* 02/21/23   Chandrasekhar, Mahesh A, MD  nystatin  cream (MYCOSTATIN ) Apply topically 2 (two) times daily. 08/04/20   Eben Reyes BROCKS, MD  ondansetron  (ZOFRAN -ODT) 4 MG disintegrating tablet Take 4 mg by mouth every 8 (eight) hours as needed for nausea (dissolve orally). 07/01/17   [provider]  pantoprazole  (PROTONIX ) 40 MG tablet Take 40 mg by mouth 2 (two) times daily. 04/15/23   [provider]  polyethylene glycol powder (GLYCOLAX/MIRALAX) 17 GM/SCOOP powder Take 17 g by mouth daily as needed for mild constipation.    [provider]  rosuvastatin  (CRESTOR ) 5 MG tablet Take 1 tablet (5 mg total) by mouth 3 (three) times a week. 08/03/22 05/27/23  Santo Stanly LABOR, MD  senna (SENOKOT) 8.6 MG tablet Take 1 tablet by mouth as needed for constipation. 07/28/21   [provider]  spironolactone  (ALDACTONE ) 25 MG tablet Take 1 tablet (25 mg total) by mouth daily. 09/17/23   Chandrasekhar, Stanly LABOR, MD  sucralfate  (CARAFATE ) 1 g tablet Take 1 tablet (1 g total) by mouth 4 (four) times daily -  with meals and at bedtime. Patient taking differently: Take 1 g by mouth daily at 6 (six) AM. 1 tab in am, 1 tab in pm 08/28/21   Patsy Lenis, MD  triamcinolone  cream (KENALOG ) 0.1 % Apply 1 application. topically 2 (two) times daily as needed (rash). 04/06/21   [provider]    Allergies: Moxifloxacin, Quinolones, Sulfa antibiotics, Furosemide , Aspirin , Doxazosin , Duloxetine, Duloxetine hcl, Hydrochlorothiazide, Ibuprofen, Nebivolol  hcl, Other, Ranexa  [ranolazine ], Robaxin [methocarbamol], Topiramate, Xarelto  [rivaroxaban ], Zofran  [ondansetron  hcl], Codeine, Dimethyl sulfoxide, Macrodantin  [nitrofurantoin  macrocrystal], Nitrofurantoin , Tape, and Yellow dye #6 (sunset yellow)    Review of  Systems  Updated Vital Signs BP (!) 112/49   Pulse 72   Temp 98.5 F (36.9 C) (Oral)   Resp 17   SpO2 95%   Physical Exam Vitals and nursing note reviewed.  Constitutional:      General: She is not in acute distress.    Appearance: She is well-developed.  HENT:     Head: Normocephalic.     Comments: Raccoon eyes Eyes:     Conjunctiva/sclera: Conjunctivae normal.     Pupils: Pupils are equal, round, and reactive to light.  Cardiovascular:     Rate and Rhythm: Normal rate and regular rhythm.  Pulmonary:     Effort: Pulmonary effort is normal. No respiratory distress.     Breath sounds: Normal breath sounds.  Abdominal:     Palpations: Abdomen is soft.     Tenderness: There is no abdominal tenderness.  Musculoskeletal:        General: Swelling, tenderness, deformity and signs of injury present.     Cervical back: Neck supple.     Comments: Patient able to move right  elbow, but endorses upper right arm pain. Normal sensation in distal right upper extremity. Palpable radial pulse. Swelling and possible deformity to upper right arm.   Skin:    General: Skin is warm and dry.     Capillary Refill: Capillary refill takes less than 2 seconds.     Findings: Bruising present.     Comments: Significant bruising to upper right shoulder and chest  Neurological:     Mental Status: She is alert and oriented to person, place, and time.     Sensory: No sensory deficit.  Psychiatric:        Mood and Affect: Mood normal.     (all labs ordered are listed, but only abnormal results are displayed) Labs Reviewed  CBC WITH DIFFERENTIAL/PLATELET - Abnormal; Notable for the following components:      Result Value   WBC 13.8 (*)    RBC 2.79 (*)    Hemoglobin 8.2 (*)    HCT 25.8 (*)    Neutro Abs 11.4 (*)    Monocytes Absolute 1.3 (*)    Abs Immature Granulocytes 0.12 (*)    All other components within normal limits  COMPREHENSIVE METABOLIC PANEL WITH GFR - Abnormal; Notable for the  following components:   Glucose, Bld 121 (*)    BUN 36 (*)    Creatinine, Ser 1.50 (*)    Calcium  8.7 (*)    AST 71 (*)    Total Bilirubin 1.6 (*)    GFR, Estimated 36 (*)    All other components within normal limits  PROTIME-INR - Abnormal; Notable for the following components:   Prothrombin Time 19.9 (*)    INR 1.6 (*)    All other components within normal limits  URINALYSIS, ROUTINE W REFLEX MICROSCOPIC - Abnormal; Notable for the following components:   APPearance HAZY (*)    Specific Gravity, Urine 1.043 (*)    Hgb urine dipstick MODERATE (*)    Ketones, ur 20 (*)    Nitrite POSITIVE (*)    Leukocytes,Ua SMALL (*)    Bacteria, UA MANY (*)    All other components within normal limits    EKG: EKG Interpretation Date/Time:  Friday December 06 2023 22:02:12 EDT Ventricular Rate:  86 PR Interval:  146 QRS Duration:  99 QT Interval:  356 QTC Calculation: 426 R Axis:   55  Text Interpretation: Sinus rhythm Nonspecific T abnormalities, lateral leads , new since last tracing Confirmed by Randol Simmonds [phone]) on 12/06/2023 10:07:13 PM  Radiology: CT CHEST ABDOMEN PELVIS W CONTRAST Result Date: 12/07/2023 CLINICAL DATA:  Polytrauma, blunt Fall, severe bruising, on blood thinners. Fall. Chest and right arm pain. EXAM: CT CHEST, ABDOMEN, AND PELVIS WITH CONTRAST TECHNIQUE: Multidetector CT imaging of the chest, abdomen and pelvis was performed following the standard protocol during bolus administration of intravenous contrast. RADIATION DOSE REDUCTION: This exam was performed according to the departmental dose-optimization program which includes automated exposure control, adjustment of the mA and/or kV according to patient size and/or use of iterative reconstruction technique. CONTRAST:  75mL OMNIPAQUE  IOHEXOL  350 MG/ML SOLN COMPARISON:  09/15/2020 FINDINGS: CT CHEST FINDINGS Cardiovascular: Heart is mildly enlarged. Scattered coronary artery and aortic atherosclerosis. No evidence of  aortic aneurysm. Mediastinum/Nodes: No mediastinal, hilar, or axillary adenopathy. Trachea and esophagus are unremarkable. Thyroid  unremarkable. Lungs/Pleura: No confluent opacities, effusions or pneumothorax. Musculoskeletal: Stranding within the subcutaneous soft tissues of the right breast, likely hematoma. Comminuted, displaced right humeral fracture noted as seen on earlier plain films. No additional  acute bony abnormality. CT ABDOMEN PELVIS FINDINGS Hepatobiliary: No hepatic injury or perihepatic hematoma. Prior cholecystectomy. Pancreas: No focal abnormality or ductal dilatation. Spleen: No splenic injury or perisplenic hematoma. Adrenals/Urinary Tract: No adrenal hemorrhage or renal injury identified. Bladder is unremarkable. Stomach/Bowel: Normal appendix. Sigmoid diverticulosis. No active diverticulitis. Stomach and small bowel decompressed, unremarkable. Vascular/Lymphatic: Aortic atherosclerosis. No evidence of aneurysm or adenopathy. Reproductive: Prior hysterectomy.  No adnexal masses. Other: No free fluid or free air. Musculoskeletal: Leftward scoliosis in the lumbar spine with associated degenerative changes. No acute bony abnormality. IMPRESSION: Stranding within the subcutaneous soft tissues of the right breast/chest wall most likely reflecting hematoma. Otherwise no significant traumatic injury in the chest, abdomen or pelvis. Comminuted, displaced right humeral fracture as seen on earlier plain films. Cardiomegaly, coronary artery disease.  Aortic atherosclerosis. Sigmoid diverticulosis. Electronically Signed   By: Franky Crease M.D.   On: 12/07/2023 00:16   CT Cervical Spine Wo Contrast Result Date: 12/07/2023 CLINICAL DATA:  Fall, neck trauma EXAM: CT CERVICAL SPINE WITHOUT CONTRAST TECHNIQUE: Multidetector CT imaging of the cervical spine was performed without intravenous contrast. Multiplanar CT image reconstructions were also generated. RADIATION DOSE REDUCTION: This exam was performed  according to the departmental dose-optimization program which includes automated exposure control, adjustment of the mA and/or kV according to patient size and/or use of iterative reconstruction technique. COMPARISON:  None Available. FINDINGS: Alignment: No subluxation.  Loss of cervical lordosis. Skull base and vertebrae: No acute fracture. No primary bone lesion or focal pathologic process. Soft tissues and spinal canal: No prevertebral fluid or swelling. No visible canal hematoma. Disc levels: Moderate bilateral degenerative facet disease. Fusion across multiple facet joints, left greater than right. Mild degenerative disc disease. Multilevel bilateral neural foraminal narrowing. Upper chest: No acute findings Other: None IMPRESSION: Degenerative disc and facet disease.  No acute bony abnormality. Electronically Signed   By: Franky Crease M.D.   On: 12/07/2023 00:12   CT Head Wo Contrast Result Date: 12/07/2023 CLINICAL DATA:  Fall, head trauma, on blood thinners. EXAM: CT HEAD WITHOUT CONTRAST TECHNIQUE: Contiguous axial images were obtained from the base of the skull through the vertex without intravenous contrast. RADIATION DOSE REDUCTION: This exam was performed according to the departmental dose-optimization program which includes automated exposure control, adjustment of the mA and/or kV according to patient size and/or use of iterative reconstruction technique. COMPARISON:  09/15/2020 FINDINGS: Brain: No acute intracranial abnormality. Specifically, no hemorrhage, hydrocephalus, mass lesion, acute infarction, or significant intracranial injury. There is atrophy and chronic small vessel disease changes. Vascular: No hyperdense vessel or unexpected calcification. Skull: No acute calvarial abnormality. Sinuses/Orbits: No acute findings Other: Soft tissue swelling in the forehead. IMPRESSION: Atrophy, chronic microvascular disease. No acute intracranial abnormality. Electronically Signed   By: Franky Crease  M.D.   On: 12/07/2023 00:10   DG Shoulder Right Result Date: 12/06/2023 CLINICAL DATA:  Clemens, right arm pain EXAM: RIGHT SHOULDER - 2+ VIEW; RIGHT ELBOW - 2 VIEW; RIGHT HUMERUS - 2+ VIEW COMPARISON:  None Available. FINDINGS: Right shoulder: Internal and external views of the right shoulder are obtained. There is a comminuted spiral fracture of the proximal right humeral diaphysis, with varus angulation at the fracture site. Glenohumeral and acromioclavicular joints remain in anatomic alignment. Diffuse soft tissue swelling. Right humerus: Frontal and lateral views demonstrate comminuted spiral proximal right humeral diaphyseal fracture, with varus angulation at the fracture site. Alignment of the right shoulder and elbow remains anatomic. Diffuse soft tissue swelling. Right elbow: Frontal and  lateral views are obtained. There are no acute displaced fractures. Alignment is anatomic. Mild osteoarthritis. Diffuse subcutaneous edema. IMPRESSION: 1. Comminuted displaced spiral fracture of the proximal right humeral diaphysis, with varus angulation at the fracture site. 2. Diffuse soft tissue swelling throughout the right upper extremity. Electronically Signed   By: Ozell Daring M.D.   On: 12/06/2023 22:58   DG Humerus Right Result Date: 12/06/2023 CLINICAL DATA:  Clemens, right arm pain EXAM: RIGHT SHOULDER - 2+ VIEW; RIGHT ELBOW - 2 VIEW; RIGHT HUMERUS - 2+ VIEW COMPARISON:  None Available. FINDINGS: Right shoulder: Internal and external views of the right shoulder are obtained. There is a comminuted spiral fracture of the proximal right humeral diaphysis, with varus angulation at the fracture site. Glenohumeral and acromioclavicular joints remain in anatomic alignment. Diffuse soft tissue swelling. Right humerus: Frontal and lateral views demonstrate comminuted spiral proximal right humeral diaphyseal fracture, with varus angulation at the fracture site. Alignment of the right shoulder and elbow remains anatomic.  Diffuse soft tissue swelling. Right elbow: Frontal and lateral views are obtained. There are no acute displaced fractures. Alignment is anatomic. Mild osteoarthritis. Diffuse subcutaneous edema. IMPRESSION: 1. Comminuted displaced spiral fracture of the proximal right humeral diaphysis, with varus angulation at the fracture site. 2. Diffuse soft tissue swelling throughout the right upper extremity. Electronically Signed   By: Ozell Daring M.D.   On: 12/06/2023 22:58   DG Elbow 2 Views Right Result Date: 12/06/2023 CLINICAL DATA:  Clemens, right arm pain EXAM: RIGHT SHOULDER - 2+ VIEW; RIGHT ELBOW - 2 VIEW; RIGHT HUMERUS - 2+ VIEW COMPARISON:  None Available. FINDINGS: Right shoulder: Internal and external views of the right shoulder are obtained. There is a comminuted spiral fracture of the proximal right humeral diaphysis, with varus angulation at the fracture site. Glenohumeral and acromioclavicular joints remain in anatomic alignment. Diffuse soft tissue swelling. Right humerus: Frontal and lateral views demonstrate comminuted spiral proximal right humeral diaphyseal fracture, with varus angulation at the fracture site. Alignment of the right shoulder and elbow remains anatomic. Diffuse soft tissue swelling. Right elbow: Frontal and lateral views are obtained. There are no acute displaced fractures. Alignment is anatomic. Mild osteoarthritis. Diffuse subcutaneous edema. IMPRESSION: 1. Comminuted displaced spiral fracture of the proximal right humeral diaphysis, with varus angulation at the fracture site. 2. Diffuse soft tissue swelling throughout the right upper extremity. Electronically Signed   By: Ozell Daring M.D.   On: 12/06/2023 22:58     .Ortho Injury Treatment  Date/Time: 12/07/2023 4:52 AM  Performed by: Logan Ubaldo NOVAK, PA-C Authorized by: Logan Ubaldo NOVAK, PA-C   Consent:    Consent obtained:  Verbal   Consent given by:  Patient   Risks discussed:  Stiffness, restricted joint movement  and fractureInjury location: upper arm Location details: right upper arm Injury type: fracture-dislocation Pre-procedure neurovascular assessment: neurovascularly intact Pre-procedure range of motion: reduced Immobilization: sling Splint Applied by: Kemp Balm Post-procedure neurovascular assessment: post-procedure neurovascularly intact Post-procedure range of motion: unchanged      Medications Ordered in the ED  morphine  (PF) 4 MG/ML injection 4 mg (4 mg Intravenous Given 12/06/23 2248)  metoCLOPramide  (REGLAN ) injection 10 mg (10 mg Intravenous Given 12/06/23 2248)  iohexol  (OMNIPAQUE ) 350 MG/ML injection 75 mL (75 mLs Intravenous Contrast Given 12/07/23 0006)  sodium chloride  0.9 % bolus 1,000 mL (0 mLs Intravenous Stopped 12/07/23 0152)  Medical Decision Making Amount and/or Complexity of Data Reviewed Labs: ordered. Radiology: ordered.  Risk Prescription drug management. Decision regarding hospitalization.   This patient presents to the ED for concern of right shoulder pain, injuries and bruising post fall, this involves an extensive number of treatment options, and is a complaint that carries with it a high risk of complications and morbidity.  The differential diagnosis includes intracranial abnormality, fracture dislocation, soft tissue injury, intrathoracic injury, intra-abdominal injury, others   Co morbidities / Chronic conditions that complicate the patient evaluation  Fibromyalgia, chronic pain, CHF   Additional history obtained:  Additional history obtained from EMR External records from outside source obtained and reviewed including urology notes showing patient on nitrofurantoin , despite allergy, due to sensitivities   Lab Tests:  I Ordered, and personally interpreted labs.  The pertinent results include: Hemoglobin 8.2, (10.8 6 months ago), leukocytosis with a white count of 13,800, creatinine 1.5 with baseline close to  1   Imaging Studies ordered:  I ordered imaging studies including plain films of the right shoulder, humerus, and elbow. CT scans of the head and cervical spine without contrast. CT scans with contrast of the chest, abdomen, and pelvis  I independently visualized and interpreted imaging which showed  1. Comminuted displaced spiral fracture of the proximal right  humeral diaphysis, with varus angulation at the fracture site.  2. Diffuse soft tissue swelling throughout the right upper  extremity.   Atrophy, chronic microvascular disease.    No acute intracranial abnormality.  Degenerative disc and facet disease.  No acute bony abnormality  Stranding within the subcutaneous soft tissues of the right  breast/chest wall most likely reflecting hematoma. Otherwise no  significant traumatic injury in the chest, abdomen or pelvis.    Comminuted, displaced right humeral fracture as seen on earlier  plain films.    Cardiomegaly, coronary artery disease.  Aortic atherosclerosis.    Sigmoid diverticulosis.   I agree with the radiologist interpretation   Cardiac Monitoring: / EKG:  The patient was maintained on a cardiac monitor.  I personally viewed and interpreted the cardiac monitored which showed an underlying rhythm of: sinus rhythm   Problem List / ED Course / Critical interventions / Medication management   I ordered medication including morphine , reglan , saline   Reevaluation of the patient after these medicines showed that the patient improved\   Consultations Obtained:  I requested consultation with the orthopedic surgeon, Dr. Beuford,  and discussed lab and imaging findings as well as pertinent plan - they recommend: sling. Will review images and make recommendations  I requested consultation with the hospitalist, Dr.Djan, who agrees to see patient for admission    Test / Admission - Considered:  Patient with humerus fracture, extensive bruising, borderline AKI, and  hemoglobin drop post trauma on Eliquis , Patient needs admission for observation and further management.       Final diagnoses:  Symptomatic anemia  Fall, initial encounter  Closed fracture of shaft of right humerus, unspecified fracture morphology, initial encounter  AKI (acute kidney injury) [facility])    ED Discharge Orders     None          Logan Ubaldo KATHEE DEVONNA 12/07/23 0344    Logan Ubaldo KATHEE, PA-C 12/07/23 9547    Randol Simmonds, MD 12/07/23 [phone]

## 2023-12-06 NOTE — ED Triage Notes (Signed)
 Pt arrived with [facility] for fall that occurred on Wednesday. She was sitting in her recliner and stood up too fast. Reports feeling dizzy at that time and fell into a window sill. Today started feeling nausea, which she reports is not too abnormal as nausea is a side effect of several of her meds. Pt is on Eliquis  and has missed a few dose. Also has not taken bp medications due to not being able to open bottles. Significant bruising noted to face, around eyes, and left neck, right shoulder and across chest. Significant swelling to R arm with tenderness palpated to humerus. CMS intact. Right Radial pulse present

## 2023-12-06 NOTE — ED Provider Notes (Incomplete)
 I provided a substantive portion of the care of this patient.  I personally made/approved the management plan for this patient and take responsibility for the patient management. {Remember to document shared critical care using edcritical dot phrase:1} EKG Interpretation Date/Time:  Friday December 06 2023 22:02:12 EDT Ventricular Rate:  86 PR Interval:  146 QRS Duration:  99 QT Interval:  356 QTC Calculation: 426 R Axis:   55  Text Interpretation: Sinus rhythm Nonspecific T abnormalities, lateral leads , new since last tracing Confirmed by Randol Simmonds [phone]) on 12/06/2023 10:07:13 PM

## 2023-12-07 ENCOUNTER — Inpatient Hospital Stay (HOSPITAL_COMMUNITY)

## 2023-12-07 ENCOUNTER — Encounter (HOSPITAL_COMMUNITY): Payer: Self-pay

## 2023-12-07 DIAGNOSIS — I272 Pulmonary hypertension, unspecified: Secondary | ICD-10-CM | POA: Diagnosis present

## 2023-12-07 DIAGNOSIS — I4891 Unspecified atrial fibrillation: Secondary | ICD-10-CM | POA: Diagnosis present

## 2023-12-07 DIAGNOSIS — S199XXA Unspecified injury of neck, initial encounter: Secondary | ICD-10-CM | POA: Diagnosis not present

## 2023-12-07 DIAGNOSIS — D62 Acute posthemorrhagic anemia: Secondary | ICD-10-CM | POA: Diagnosis present

## 2023-12-07 DIAGNOSIS — W1830XA Fall on same level, unspecified, initial encounter: Secondary | ICD-10-CM | POA: Diagnosis present

## 2023-12-07 DIAGNOSIS — M4802 Spinal stenosis, cervical region: Secondary | ICD-10-CM | POA: Diagnosis not present

## 2023-12-07 DIAGNOSIS — W19XXXA Unspecified fall, initial encounter: Secondary | ICD-10-CM | POA: Diagnosis not present

## 2023-12-07 DIAGNOSIS — N3010 Interstitial cystitis (chronic) without hematuria: Secondary | ICD-10-CM | POA: Diagnosis not present

## 2023-12-07 DIAGNOSIS — S42301A Unspecified fracture of shaft of humerus, right arm, initial encounter for closed fracture: Secondary | ICD-10-CM | POA: Diagnosis present

## 2023-12-07 DIAGNOSIS — E039 Hypothyroidism, unspecified: Secondary | ICD-10-CM | POA: Diagnosis not present

## 2023-12-07 DIAGNOSIS — S3993XA Unspecified injury of pelvis, initial encounter: Secondary | ICD-10-CM | POA: Diagnosis not present

## 2023-12-07 DIAGNOSIS — G8929 Other chronic pain: Secondary | ICD-10-CM | POA: Diagnosis present

## 2023-12-07 DIAGNOSIS — Z952 Presence of prosthetic heart valve: Secondary | ICD-10-CM | POA: Diagnosis not present

## 2023-12-07 DIAGNOSIS — J952 Acute pulmonary insufficiency following nonthoracic surgery: Secondary | ICD-10-CM | POA: Diagnosis not present

## 2023-12-07 DIAGNOSIS — S299XXA Unspecified injury of thorax, initial encounter: Secondary | ICD-10-CM | POA: Diagnosis not present

## 2023-12-07 DIAGNOSIS — E859 Amyloidosis, unspecified: Secondary | ICD-10-CM | POA: Diagnosis present

## 2023-12-07 DIAGNOSIS — I5032 Chronic diastolic (congestive) heart failure: Secondary | ICD-10-CM | POA: Diagnosis not present

## 2023-12-07 DIAGNOSIS — S0990XA Unspecified injury of head, initial encounter: Secondary | ICD-10-CM | POA: Diagnosis not present

## 2023-12-07 DIAGNOSIS — M5030 Other cervical disc degeneration, unspecified cervical region: Secondary | ICD-10-CM | POA: Diagnosis not present

## 2023-12-07 DIAGNOSIS — I43 Cardiomyopathy in diseases classified elsewhere: Secondary | ICD-10-CM | POA: Diagnosis not present

## 2023-12-07 DIAGNOSIS — S42351A Displaced comminuted fracture of shaft of humerus, right arm, initial encounter for closed fracture: Secondary | ICD-10-CM | POA: Diagnosis not present

## 2023-12-07 DIAGNOSIS — N3090 Cystitis, unspecified without hematuria: Secondary | ICD-10-CM | POA: Diagnosis not present

## 2023-12-07 DIAGNOSIS — S42351D Displaced comminuted fracture of shaft of humerus, right arm, subsequent encounter for fracture with routine healing: Secondary | ICD-10-CM | POA: Diagnosis not present

## 2023-12-07 DIAGNOSIS — D649 Anemia, unspecified: Secondary | ICD-10-CM | POA: Diagnosis not present

## 2023-12-07 DIAGNOSIS — Y92009 Unspecified place in unspecified non-institutional (private) residence as the place of occurrence of the external cause: Secondary | ICD-10-CM | POA: Diagnosis not present

## 2023-12-07 DIAGNOSIS — I350 Nonrheumatic aortic (valve) stenosis: Secondary | ICD-10-CM | POA: Diagnosis not present

## 2023-12-07 DIAGNOSIS — T45515A Adverse effect of anticoagulants, initial encounter: Secondary | ICD-10-CM | POA: Diagnosis present

## 2023-12-07 DIAGNOSIS — S42291D Other displaced fracture of upper end of right humerus, subsequent encounter for fracture with routine healing: Secondary | ICD-10-CM | POA: Diagnosis not present

## 2023-12-07 DIAGNOSIS — I10 Essential (primary) hypertension: Secondary | ICD-10-CM | POA: Diagnosis not present

## 2023-12-07 DIAGNOSIS — R2689 Other abnormalities of gait and mobility: Secondary | ICD-10-CM | POA: Diagnosis not present

## 2023-12-07 DIAGNOSIS — S42212A Unspecified displaced fracture of surgical neck of left humerus, initial encounter for closed fracture: Secondary | ICD-10-CM | POA: Diagnosis not present

## 2023-12-07 DIAGNOSIS — T8484XD Pain due to internal orthopedic prosthetic devices, implants and grafts, subsequent encounter: Secondary | ICD-10-CM | POA: Diagnosis not present

## 2023-12-07 DIAGNOSIS — Z9181 History of falling: Secondary | ICD-10-CM | POA: Diagnosis not present

## 2023-12-07 DIAGNOSIS — M6281 Muscle weakness (generalized): Secondary | ICD-10-CM | POA: Diagnosis not present

## 2023-12-07 DIAGNOSIS — D6832 Hemorrhagic disorder due to extrinsic circulating anticoagulants: Secondary | ICD-10-CM | POA: Diagnosis present

## 2023-12-07 DIAGNOSIS — N179 Acute kidney failure, unspecified: Secondary | ICD-10-CM | POA: Diagnosis not present

## 2023-12-07 DIAGNOSIS — K589 Irritable bowel syndrome without diarrhea: Secondary | ICD-10-CM | POA: Diagnosis not present

## 2023-12-07 DIAGNOSIS — I2510 Atherosclerotic heart disease of native coronary artery without angina pectoris: Secondary | ICD-10-CM | POA: Diagnosis not present

## 2023-12-07 DIAGNOSIS — R278 Other lack of coordination: Secondary | ICD-10-CM | POA: Diagnosis not present

## 2023-12-07 DIAGNOSIS — S42201A Unspecified fracture of upper end of right humerus, initial encounter for closed fracture: Secondary | ICD-10-CM | POA: Diagnosis present

## 2023-12-07 DIAGNOSIS — R42 Dizziness and giddiness: Secondary | ICD-10-CM | POA: Diagnosis not present

## 2023-12-07 DIAGNOSIS — R531 Weakness: Secondary | ICD-10-CM | POA: Diagnosis not present

## 2023-12-07 DIAGNOSIS — E669 Obesity, unspecified: Secondary | ICD-10-CM | POA: Diagnosis not present

## 2023-12-07 DIAGNOSIS — Z7901 Long term (current) use of anticoagulants: Secondary | ICD-10-CM | POA: Diagnosis not present

## 2023-12-07 DIAGNOSIS — M797 Fibromyalgia: Secondary | ICD-10-CM | POA: Diagnosis present

## 2023-12-07 DIAGNOSIS — E66812 Obesity, class 2: Secondary | ICD-10-CM | POA: Diagnosis present

## 2023-12-07 DIAGNOSIS — S42201D Unspecified fracture of upper end of right humerus, subsequent encounter for fracture with routine healing: Secondary | ICD-10-CM | POA: Diagnosis not present

## 2023-12-07 DIAGNOSIS — I480 Paroxysmal atrial fibrillation: Secondary | ICD-10-CM | POA: Diagnosis not present

## 2023-12-07 DIAGNOSIS — E559 Vitamin D deficiency, unspecified: Secondary | ICD-10-CM | POA: Diagnosis not present

## 2023-12-07 DIAGNOSIS — Z743 Need for continuous supervision: Secondary | ICD-10-CM | POA: Diagnosis not present

## 2023-12-07 DIAGNOSIS — S4991XA Unspecified injury of right shoulder and upper arm, initial encounter: Secondary | ICD-10-CM | POA: Diagnosis not present

## 2023-12-07 DIAGNOSIS — M4986 Spondylopathy in diseases classified elsewhere, lumbar region: Secondary | ICD-10-CM | POA: Diagnosis not present

## 2023-12-07 DIAGNOSIS — I4892 Unspecified atrial flutter: Secondary | ICD-10-CM | POA: Diagnosis not present

## 2023-12-07 DIAGNOSIS — S3991XA Unspecified injury of abdomen, initial encounter: Secondary | ICD-10-CM | POA: Diagnosis not present

## 2023-12-07 DIAGNOSIS — S42341A Displaced spiral fracture of shaft of humerus, right arm, initial encounter for closed fracture: Secondary | ICD-10-CM | POA: Diagnosis present

## 2023-12-07 DIAGNOSIS — Z01810 Encounter for preprocedural cardiovascular examination: Secondary | ICD-10-CM | POA: Diagnosis not present

## 2023-12-07 DIAGNOSIS — Z1624 Resistance to multiple antibiotics: Secondary | ICD-10-CM | POA: Diagnosis present

## 2023-12-07 DIAGNOSIS — E538 Deficiency of other specified B group vitamins: Secondary | ICD-10-CM | POA: Diagnosis not present

## 2023-12-07 DIAGNOSIS — B961 Klebsiella pneumoniae [K. pneumoniae] as the cause of diseases classified elsewhere: Secondary | ICD-10-CM | POA: Diagnosis present

## 2023-12-07 DIAGNOSIS — I110 Hypertensive heart disease with heart failure: Secondary | ICD-10-CM | POA: Diagnosis not present

## 2023-12-07 DIAGNOSIS — Z23 Encounter for immunization: Secondary | ICD-10-CM | POA: Diagnosis present

## 2023-12-07 DIAGNOSIS — G629 Polyneuropathy, unspecified: Secondary | ICD-10-CM | POA: Diagnosis present

## 2023-12-07 LAB — PREPARE RBC (CROSSMATCH)

## 2023-12-07 LAB — URINALYSIS, ROUTINE W REFLEX MICROSCOPIC
Bilirubin Urine: NEGATIVE
Glucose, UA: NEGATIVE mg/dL
Ketones, ur: 20 mg/dL — AB
Nitrite: POSITIVE — AB
Protein, ur: NEGATIVE mg/dL
Specific Gravity, Urine: 1.043 — ABNORMAL HIGH (ref 1.005–1.030)
pH: 5 (ref 5.0–8.0)

## 2023-12-07 LAB — CBC
HCT: 21.4 % — ABNORMAL LOW (ref 36.0–46.0)
Hemoglobin: 6.6 g/dL — CL (ref 12.0–15.0)
MCH: 29.1 pg (ref 26.0–34.0)
MCHC: 30.8 g/dL (ref 30.0–36.0)
MCV: 94.3 fL (ref 80.0–100.0)
Platelets: 203 K/uL (ref 150–400)
RBC: 2.27 MIL/uL — ABNORMAL LOW (ref 3.87–5.11)
RDW: 13.4 % (ref 11.5–15.5)
WBC: 9.6 K/uL (ref 4.0–10.5)
nRBC: 0 % (ref 0.0–0.2)

## 2023-12-07 LAB — VITAMIN D 25 HYDROXY (VIT D DEFICIENCY, FRACTURES): Vit D, 25-Hydroxy: 23.7 ng/mL — ABNORMAL LOW (ref 30–100)

## 2023-12-07 LAB — ABO/RH: ABO/RH(D): O POS

## 2023-12-07 LAB — HEMOGLOBIN AND HEMATOCRIT, BLOOD
HCT: 22.9 % — ABNORMAL LOW (ref 36.0–46.0)
Hemoglobin: 7.1 g/dL — ABNORMAL LOW (ref 12.0–15.0)

## 2023-12-07 MED ORDER — SODIUM CHLORIDE 0.9 % IV SOLN
1.0000 g | Freq: Two times a day (BID) | INTRAVENOUS | Status: DC
  Administered 2023-12-07 – 2023-12-08 (×3): 1 g via INTRAVENOUS
  Filled 2023-12-07 (×4): qty 20

## 2023-12-07 MED ORDER — SODIUM CHLORIDE 0.9 % IV BOLUS
1000.0000 mL | Freq: Once | INTRAVENOUS | Status: AC
  Administered 2023-12-07: 1000 mL via INTRAVENOUS

## 2023-12-07 MED ORDER — INFLUENZA VAC SPLIT HIGH-DOSE 0.5 ML IM SUSY
0.5000 mL | PREFILLED_SYRINGE | INTRAMUSCULAR | Status: AC
  Administered 2023-12-08: 0.5 mL via INTRAMUSCULAR
  Filled 2023-12-07: qty 0.5

## 2023-12-07 MED ORDER — ONDANSETRON HCL 4 MG PO TABS: 4.0000 mg | ORAL_TABLET | Freq: Four times a day (QID) | ORAL | Status: DC | PRN

## 2023-12-07 MED ORDER — SUCRALFATE 1 G PO TABS
1.0000 g | ORAL_TABLET | Freq: Three times a day (TID) | ORAL | Status: DC
  Administered 2023-12-07 – 2023-12-13 (×15): 1 g via ORAL
  Filled 2023-12-07 (×20): qty 1

## 2023-12-07 MED ORDER — ISOSORBIDE DINITRATE 10 MG PO TABS
10.0000 mg | ORAL_TABLET | Freq: Three times a day (TID) | ORAL | Status: DC
  Administered 2023-12-07 – 2023-12-13 (×17): 10 mg via ORAL
  Filled 2023-12-07 (×20): qty 1

## 2023-12-07 MED ORDER — MORPHINE SULFATE (PF) 2 MG/ML IV SOLN
1.0000 mg | INTRAVENOUS | Status: DC | PRN
  Administered 2023-12-07 – 2023-12-08 (×3): 2 mg via INTRAVENOUS
  Filled 2023-12-07 (×3): qty 1

## 2023-12-07 MED ORDER — HYDRALAZINE HCL 50 MG PO TABS
50.0000 mg | ORAL_TABLET | Freq: Three times a day (TID) | ORAL | Status: DC
  Administered 2023-12-07 – 2023-12-13 (×17): 50 mg via ORAL
  Filled 2023-12-07 (×17): qty 1

## 2023-12-07 MED ORDER — METOPROLOL TARTRATE 25 MG PO TABS
25.0000 mg | ORAL_TABLET | Freq: Two times a day (BID) | ORAL | Status: DC
  Administered 2023-12-07 – 2023-12-13 (×11): 25 mg via ORAL
  Filled 2023-12-07 (×13): qty 1

## 2023-12-07 MED ORDER — SODIUM CHLORIDE 0.9% FLUSH
3.0000 mL | Freq: Two times a day (BID) | INTRAVENOUS | Status: DC
  Administered 2023-12-07 – 2023-12-13 (×12): 3 mL via INTRAVENOUS

## 2023-12-07 MED ORDER — UROGESIC-BLUE 81.6 MG PO TABS: 1.0000 | ORAL_TABLET | Freq: Four times a day (QID) | ORAL | Status: DC | PRN

## 2023-12-07 MED ORDER — ACETAMINOPHEN 325 MG PO TABS: 650.0000 mg | ORAL_TABLET | Freq: Four times a day (QID) | ORAL | Status: DC | PRN

## 2023-12-07 MED ORDER — HYDROCODONE-ACETAMINOPHEN 7.5-325 MG PO TABS
1.0000 | ORAL_TABLET | Freq: Three times a day (TID) | ORAL | Status: DC | PRN
  Administered 2023-12-08 (×3): 1 via ORAL
  Filled 2023-12-07 (×3): qty 1

## 2023-12-07 MED ORDER — IOHEXOL 350 MG/ML SOLN
75.0000 mL | Freq: Once | INTRAVENOUS | Status: AC | PRN
  Administered 2023-12-07: 75 mL via INTRAVENOUS

## 2023-12-07 MED ORDER — ONDANSETRON HCL 4 MG/2ML IJ SOLN: 4.0000 mg | Freq: Four times a day (QID) | INTRAMUSCULAR | Status: DC | PRN

## 2023-12-07 MED ORDER — EZETIMIBE 10 MG PO TABS
10.0000 mg | ORAL_TABLET | Freq: Every day | ORAL | Status: DC
  Administered 2023-12-07 – 2023-12-13 (×7): 10 mg via ORAL
  Filled 2023-12-07 (×7): qty 1

## 2023-12-07 MED ORDER — GABAPENTIN 300 MG PO CAPS
300.0000 mg | ORAL_CAPSULE | Freq: Every day | ORAL | Status: DC
  Administered 2023-12-07 – 2023-12-11 (×5): 300 mg via ORAL
  Filled 2023-12-07 (×5): qty 1

## 2023-12-07 MED ORDER — PHENAZOPYRIDINE HCL 100 MG PO TABS
100.0000 mg | ORAL_TABLET | Freq: Three times a day (TID) | ORAL | Status: DC | PRN
  Administered 2023-12-07: 100 mg via ORAL
  Filled 2023-12-07: qty 1

## 2023-12-07 MED ORDER — SODIUM CHLORIDE 0.9% IV SOLUTION: Freq: Once | INTRAVENOUS | Status: AC

## 2023-12-07 MED ORDER — CALCIUM CARBONATE ANTACID 500 MG PO CHEW
1.0000 | CHEWABLE_TABLET | Freq: Three times a day (TID) | ORAL | Status: DC
  Administered 2023-12-08 – 2023-12-13 (×13): 200 mg via ORAL
  Filled 2023-12-07 (×13): qty 1

## 2023-12-07 MED ORDER — SODIUM CHLORIDE 0.9 % IV SOLN: INTRAVENOUS | Status: DC

## 2023-12-07 MED ORDER — ACETAMINOPHEN 325 MG PO TABS
650.0000 mg | ORAL_TABLET | Freq: Four times a day (QID) | ORAL | Status: DC | PRN
  Administered 2023-12-11: 650 mg via ORAL
  Filled 2023-12-07: qty 2

## 2023-12-07 MED ORDER — ROSUVASTATIN CALCIUM 5 MG PO TABS
5.0000 mg | ORAL_TABLET | ORAL | Status: DC
  Administered 2023-12-13: 5 mg via ORAL
  Filled 2023-12-07 (×2): qty 1

## 2023-12-07 MED ORDER — PANTOPRAZOLE SODIUM 40 MG PO TBEC
40.0000 mg | DELAYED_RELEASE_TABLET | Freq: Two times a day (BID) | ORAL | Status: DC
  Administered 2023-12-07 – 2023-12-13 (×13): 40 mg via ORAL
  Filled 2023-12-07 (×13): qty 1

## 2023-12-07 NOTE — H&P (Signed)
 History and Physical    Patient: Jennifer [name] FMW:[id number] DOB: Feb 08, 1946 DOA: 12/06/2023 DOS: the patient was seen and examined on 12/07/2023 PCP: Katina Pfeiffer, PA-C  Patient coming from: Home-lives alone  Chief Complaint:  Chief Complaint  Patient presents with   Fall   HPI: Jennifer [name] is a 78 y.o. female with medical history significant of severe interstitial cystitis status post prior bladder reconstruction, atrial flutter on Eliquis , hypertension, BPPV with intermittent vertigo, HFrEF, CAD, severe AS with outpatient workup in progress to determine if candidate for TAVR, possible cardiac amyloidosis, hypothyroidism, dyslipidemia, fibromyalgia, anemia followed by hematology in the outpatient setting and history of prior polypharmacy.  This lady presented via EMS.  She reported a fall that had occurred Wednesday 9/3.  She was sitting in her recliner and per her interpretation she stood up too fast.  She felt dizzy and fell into a windowsill.  On date of presentation she began feeling very nauseous which was worse than her baseline nausea.  She had continued to take her Eliquis  after the fall and her last dose was taken on the evening of 9/5.  She was having some difficulty though taking all of her medications since her dominant hand/arm was injured when she fell.  She was reporting significant right upper extremity pain and swelling, significant bruising about the head face and upper chest.  In triage she was noted to have significant pain over the upper area of the right arm.  Pulse and sensation were otherwise intact.  In the ED she was afebrile and normotensive to hypertensive.  Her labs revealed a hemoglobin of 8.2 which was slightly lower than her baseline of between 8.7 and occasionally 11.11 after transfusion.  BUN was slightly elevated at 36, creatinine 1.5 which was slightly higher than her baseline of 1.04-1.12.  Patient reports because of her inability to move her right arm  well she was unable to feed herself adequately or drink fluids.  In addition because of her underlying chronic cystitis she has to self catheterize frequently and was having difficulty doing this with her left hand.  She is on multiple medications for her multiple cardiac conditions and is noted was unable to take these medications consistently although she does take diuretics -spironolactone  and Lasix .  He was primarily complaining of right shoulder and right upper extremity pain.  Ridging revealed a comminuted displaced spiral fracture of the proximal right humeral diaphysis with varus angulation at the fracture site.  In addition there was diffuse soft tissue swelling throughout the right upper extremity.  These findings were conveyed to the orthopedic surgeon on-call Dr. Beuford with recommendation from orthopedics team for a sling.  They will review images and make further recommendations.  Hospitalist service has accepted the patient for admission.  Review of Systems: Review of systems as above.  Patient has recently been treated for multidrug-resistant/ESBL urinary tract infection with Macrodantin  but continues to have symptoms.  She states that she has had chronic ESBL E. coli and a new Klebsiella infection.  She states that because of her use of Lasix  she typically in and out self caths up to 10 times per day.  She states that she typically is noncompliant with following up with recommended evaluations noting she was supposed to follow-up with cardiology to obtain an outpatient monitor monitoring device and apparently she was supposed to follow-up to arrange for cardiac catheterization as part of her evaluation for potential TAVR procedure.  She denies any frank hematuria since her  fall.  Denies abdominal pain.  Denies bruising below the upper chest area.  Past Medical History:  Diagnosis Date   Acute meniscal tear of left knee    FOLLOWED BY DR GIAFFREY   Anemia    Aortic stenosis    Ascending  aorta dilation (HCC)    Atrial flutter (HCC)    AV block, Mobitz 1    BPPV (benign paroxysmal positional vertigo)    CAD (coronary artery disease)    Chronic bladder pain    Chronic fatigue syndrome    Chronic low back pain    W/ RIGHT LEG PAIN AND NUMBNESS   Cystitis, chronic    Fibromyalgia    GERD (gastroesophageal reflux disease)    Hiatal hernia    Hypertension    IC (interstitial cystitis)    LBBB (left bundle branch block)    transient hx   OSA (obstructive sleep apnea)    per pt study yrs ago-- moderate osa ,  intolerant cpap   Pinched vertebral nerve    bilateral L2 -- L3 and L3 -- L4-/  epidural injection's treatment , PT and pain clince   PONV (postoperative nausea and vomiting)    severe   PVC's (premature ventricular contractions)    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    Self-catheterizes urinary bladder    Spinal stenosis, lumbar region with neurogenic claudication    Wears glasses    Wears partial dentures    upper and lower   Past Surgical History:  Procedure Laterality Date   ABDOMINAL AORTOGRAM N/A 09/16/2020   Procedure: ABDOMINAL AORTOGRAM;  Surgeon: Darron Deatrice LABOR, MD;  Location: [facility];  Service: Cardiovascular;  Laterality: N/A;   ABDOMINAL HYSTERECTOMY  1980   w/ Left Salpingoophorectomy   BIOPSY  08/27/2021   Procedure: BIOPSY;  Surgeon: Rosalie Kitchens, MD;  Location: [facility]OSCOPY;  Service: Gastroenterology;;   CARDIAC CATHETERIZATION  08-21-2001  dr victory sharps   normal coronary arteries and LVF   CARPAL TUNNEL RELEASE Bilateral 1986   CECALCYSTOPLASTY/  APPENDECTOMY  1984   at [facility]   CHOLECYSTECTOMY  1992   CYSTO WITH HYDRODISTENSION N/A 01/12/2016   Procedure: CYSTOSCOPY/HYDRODISTENSION;  Surgeon: Arlena Gal, MD;  Location: [facility];  Service: Urology;  Laterality: N/A;   CYSTO WITH HYDRODISTENSION N/A 08/30/2016   Procedure: CYSTOSCOPY/HYDRODISTENSION OF BLADDER  INJECTION OF MARCAINE /PYRIDIUM ;  Surgeon: Gal Arlena, MD;  Location: [facility];  Service: Urology;  Laterality: N/A;   ESOPHAGOGASTRODUODENOSCOPY (EGD) WITH PROPOFOL  N/A 08/27/2021   Procedure: ESOPHAGOGASTRODUODENOSCOPY (EGD) WITH PROPOFOL ;  Surgeon: Rosalie Kitchens, MD;  Location: [facility] ENDOSCOPY;  Service: Gastroenterology;  Laterality: N/A;   LEFT HEART CATH AND CORONARY ANGIOGRAPHY N/A 09/16/2020   Procedure: LEFT HEART CATH AND CORONARY ANGIOGRAPHY;  Surgeon: Darron Deatrice LABOR, MD;  Location: [facility];  Service: Cardiovascular;  Laterality: N/A;   MULTIPLE CYSTO/  HYDRODISTENTION/  INSTILLATION THERAPY  last one 08-19-2009   NASAL SINUS SURGERY  1987   TONSILLECTOMY  as child   VAGINAL GROWTH REMOVED  as teen   Social History:  reports that she has never smoked. She has never used smokeless tobacco. She reports that she does not drink alcohol and does not use drugs.  Allergies  Allergen Reactions   Moxifloxacin Hives, Shortness Of Breath, Swelling and Rash    Rash was severe, caused tremors, ans skin became BLOODY RED   Quinolones Anaphylaxis  Sulfa Antibiotics Hives and Rash   Furosemide  Other (See Comments)    CANNOT TAKE DUE TO Interstitial cystitis   Aspirin  Nausea Only and Other (See Comments)    GI and stomach upset   Doxazosin      constipation   Duloxetine     Other reaction(s): Other (See Comments)   Duloxetine Hcl Nausea Only and Other (See Comments)    Vertigo and heart racing also    Hydrochlorothiazide Other (See Comments)    Headaches    Ibuprofen Nausea Only and Other (See Comments)    GI upset   Nebivolol  Hcl     Other reaction(s): extreme fatigue   Other Other (See Comments)   Ranexa  [Ranolazine ]     Constipation/heart pounding   Robaxin [Methocarbamol] Other (See Comments)    Make me feel flu-like symptoms   Topiramate Itching   Xarelto  [Rivaroxaban ]     Back pain   Zofran  [Ondansetron  Hcl] Other (See Comments)     Headaches    Codeine Hives, Nausea And Vomiting and Rash   Dimethyl Sulfoxide Hives and Itching   Macrodantin  [Nitrofurantoin  Macrocrystal] Itching, Nausea And Vomiting and Rash    Abdominal and chest pain, also- blood-red skin, also   Nitrofurantoin  Diarrhea, Itching, Rash and Other (See Comments)    GI/stomach upset, sweating, chest pain, and skin turned bloody red   Tape Rash   Yellow Dye #6 (Sunset Yellow) Rash    Family History  Problem Relation Age of Onset   CAD Father    Asthma Father    Hypertension Father    Alzheimer's disease Father    Emphysema Father    COPD Father    Heart failure Mother    Peripheral vascular disease Mother    Stroke Mother    Hypertension Mother    Colonic polyp Mother    Cancer Brother        melanoma    Prior to Admission medications   Medication Sig Start Date End Date Taking? Authorizing Provider  acetaminophen  (TYLENOL ) 500 MG tablet Take 500 mg by mouth 2 (two) times daily as needed (for pain).    [provider]  aluminum hydroxide-magnesium  carbonate (GAVISCON) 95-358 MG/15ML SUSP Take by mouth as needed for indigestion or heartburn. 01/23/17   [provider]  apixaban  (ELIQUIS ) 5 MG TABS tablet Take 1 tablet (5 mg total) by mouth 2 (two) times daily. 09/11/23   Santo Stanly LABOR, MD  cyanocobalamin  (VITAMIN B12) 1000 MCG tablet Take 1,000 mcg by mouth daily. 07/12/22   [provider]  dextromethorphan -guaiFENesin  (MUCINEX  DM) 30-600 MG 12hr tablet Take 1 tablet by mouth every 12 (twelve) hours as needed for cough.    [provider]  ezetimibe  (ZETIA ) 10 MG tablet Take 1 tablet (10 mg total) by mouth daily. 06/10/23   Chandrasekhar, Stanly LABOR, MD  fexofenadine (ALLEGRA) 60 MG tablet Take 60 mg by mouth daily as needed for allergies. 01/06/14   [provider]  fluconazole  (DIFLUCAN ) 100 MG tablet Take 100 mg by mouth daily. 05/14/23   [provider]  furosemide  (LASIX ) 40 MG  tablet May take an extra dose once daily as needed for swelling 05/27/23   Chandrasekhar, Mahesh A, MD  gabapentin  (NEURONTIN ) 300 MG capsule Take 300 mg by mouth at bedtime. 07/28/21   [provider]  hydrALAZINE  (APRESOLINE ) 50 MG tablet TAKE 1 TABLET BY MOUTH THREE TIMES A DAY 06/07/23   Chandrasekhar, Mahesh A, MD  HYDROcodone -acetaminophen  (NORCO) 7.5-325 MG tablet Take  1 tablet by mouth 3 (three) times daily as needed.    [provider]  hydrocortisone  (ANUSOL -HC) 25 MG suppository Place 25 mg rectally 2 (two) times daily as needed for hemorrhoids. 12/07/20   [provider]  isosorbide  dinitrate (ISORDIL ) 10 MG tablet TAKE 1 TABLET BY MOUTH THREE TIMES A DAY 06/07/23   Chandrasekhar, Mahesh A, MD  losartan  (COZAAR ) 100 MG tablet TAKE 1 TABLET BY MOUTH EVERY DAY 08/15/23   Chandrasekhar, Mahesh A, MD  meclizine  (ANTIVERT ) 25 MG tablet Take 25 mg by mouth 3 (three) times daily as needed for dizziness.    [provider]  Methen-Hyosc-Meth Blue-Na Phos (UROGESIC-BLUE) 81.6 MG TABS Take 1 tablet by mouth every 6 (six) hours as needed (urinary burning). 08/21/21   [provider]  metoprolol  tartrate (LOPRESSOR ) 25 MG tablet TAKE 1 TABLET BY MOUTH TWICE A DAY 08/15/23   Chandrasekhar, Mahesh A, MD  nitroGLYCERIN  (NITROSTAT ) 0.4 MG SL tablet PLACE 1 TABLET UNDER THE TONGE EVERY 5 MINUTES AS NEEDED FOR CHEST PAIN *TAKE UP TO 3 TABLETS* 02/21/23   Chandrasekhar, Mahesh A, MD  nystatin  cream (MYCOSTATIN ) Apply topically 2 (two) times daily. 08/04/20   Eben Reyes BROCKS, MD  ondansetron  (ZOFRAN -ODT) 4 MG disintegrating tablet Take 4 mg by mouth every 8 (eight) hours as needed for nausea (dissolve orally). 07/01/17   [provider]  pantoprazole  (PROTONIX ) 40 MG tablet Take 40 mg by mouth 2 (two) times daily. 04/15/23   [provider]  polyethylene glycol powder (GLYCOLAX/MIRALAX) 17 GM/SCOOP powder Take 17 g by mouth daily as needed for mild  constipation.    [provider]  rosuvastatin  (CRESTOR ) 5 MG tablet Take 1 tablet (5 mg total) by mouth 3 (three) times a week. 08/03/22 05/27/23  Santo Stanly LABOR, MD  senna (SENOKOT) 8.6 MG tablet Take 1 tablet by mouth as needed for constipation. 07/28/21   [provider]  spironolactone  (ALDACTONE ) 25 MG tablet Take 1 tablet (25 mg total) by mouth daily. 09/17/23   Santo Stanly LABOR, MD  sucralfate  (CARAFATE ) 1 g tablet Take 1 tablet (1 g total) by mouth 4 (four) times daily -  with meals and at bedtime. Patient taking differently: Take 1 g by mouth daily at 6 (six) AM. 1 tab in am, 1 tab in pm 08/28/21   Patsy Lenis, MD  triamcinolone  cream (KENALOG ) 0.1 % Apply 1 application. topically 2 (two) times daily as needed (rash). 04/06/21   [provider]    Physical Exam: Vitals:   12/07/23 0819 12/07/23 1110 12/07/23 1123 12/07/23 1138  BP:  (!) 141/50 (!) 157/56 (!) 156/53  Pulse:  70 72 69  Resp:  15 18 18   Temp: 98.5 F (36.9 C)  98.2 F (36.8 C) 98.4 F (36.9 C)  TempSrc:   Oral Oral  SpO2:  100%  98%   Constitutional: NAD, calm, talkative, uncomfortable Eyes: PERRL, raccoon eye type bruising bilaterally, significant bruise on forehead extending into the scalp, also bruising around face and posterior neck ENMT: Mucous membranes are dry- Normal dentition.  Neck: normal, supple, no masses, no thyromegaly-bruising as above Respiratory: clear to auscultation bilaterally, no wheezing, no crackles. Normal respiratory effort. No accessory muscle use.  Cardiovascular: Regular rate and rhythm, no murmurs / rubs / gallops. No extremity edema. 2+ pedal pulses.  Notable bruising on anterior chest Abdomen: no tenderness, no masses palpated. No hepatosplenomegaly. Bowel sounds positive.  Musculoskeletal: no clubbing / cyanosis.  Significant bruising and edema involving the  right upper extremity primarily at the humerus region.  This edema is very tight.   Range of motion within normal limits except for right upper extremity which is now in a sling applied by orthopedic tech Skin: no rashes, lesions, ulcers. No induration-bruising as noted above Neurologic: CN 2-12 grossly intact. Sensation intact,  Strength 5/5 x all 4 extremities.  Psychiatric: Normal judgment and insight. Alert and oriented x 3. Normal mood.     Data Reviewed:  Sodium 138, potassium 4.4, chloride 101, CO2 24, glucose 121, BUN 36, creatinine 1.5, total bilirubin 1.6  White count 13,800, hemoglobin 8.2, platelets 256,000, elevated neutrophils 82%, with absolute neutrophils 11.4%  PT 19.9, INR 1.6  Pertinent imaging as above  Assessment and Plan: Fall at home Comminuted spiral fracture right humerus Fell at home after becoming dizzy No history of recurrent falls nor recurrent dizziness Prodromal symptoms consistent with orthostasis-patient on multiple antihypertensive medications as well as diuretics for underlying heart failure and suspect symptoms could have been produced by orthostatic hypotension from hypovolemia-this is especially problematic with underlying severe AS.  No awareness of any palpitations but could have had A-fib flutter with RVR Reevaluated by orthopedic team this morning and plans are to pursue surgical repair after hemoglobin corrected (see below) Preoperative risk stratification: Charlanne 1.39%, Geriatric sensitive: 1.4% and WOO 30 day mortality risl 1.29%---she has severe AS with plans for eventual replacement-med managed CAD with diffuse dz not amenable to intervention and possible cardiac amyloidosis  Oral Vicodin for moderate pain IV morphine  for severe pain or pain not controlled by oral medication  Check serum calcium  and vitamin D  levels Physical therapy evaluation postoperatively-patient lives alone and may need short-term rehabilitation prior to returning to home environment  Acute blood loss anemia Has underlying chronic anemia followed by  hematology in the outpatient setting-takes supplemental iron  Baseline hemoglobin typically between 9 and 10 Hemoglobin at presentation 8.2-repeat down to 6.6 with confirmatory 7.1 Transfused 2 units PRBCs today and follow-up hemoglobin in a.m. Suspect significant amount of blood loss into fractured arm which is consistent with physical exam-has been made worse by patient's use of Eliquis  prior to arrival-Eliquis  on hold Neurovascular checks right upper extremity given degree of swelling in arm  Atrial fibrillation on Eliquis  Eliquis  on hold Rate controlled on beta-blocker  CAD Continue beta-blocker, isosorbide  dinitrate, Zetia  and statin as tolerated Currently without chest pain  Chronic interstitial cystitis Self-reported chronic ESBL positive E. Coli Recent MDR Klebsiella UTI Current urinalysis abnormal Completed a course of Macrodantin  but urinalysis remains abnormal Repeat urine culture here IV meropenem -narrow if possible-in review of outpatient urine culture patient had 10,000-50,000 colonies of ESBL E. coli and 10,000-50,000 colonies of MDR Klebsiella No clinical signs of sepsis  Severe AS HFpEF Outpatient workup in progress to determine if patient is a candidate for TAVR Patient reports feeling dehydrated prior to fall and given degree of AS this could have contributed to her fall Hold diuretics for now (Lasix  and spironolactone ) Hold preadmission Cozaar  given mild AKI as well as suboptimal blood pressure readings  Hypertension As above regarding medication  Hypothyroidism Continue Synthroid  History of BPPV and vertigo Patient reports generalized dizziness and no vertigo symptoms    Advance Care Planning:   Code Status: Full Code   VTE prophylaxis: Holding Eliquis  for now-utilize SCDs  Consults: Orthopedic team  Family Communication: Patient only  Severity of Illness: The appropriate patient status for this patient is INPATIENT. Inpatient status is judged  to be reasonable and necessary in  order to provide the required intensity of service to ensure the patient's safety. The patient's presenting symptoms, physical exam findings, and initial radiographic and laboratory data in the context of their chronic comorbidities is felt to place them at high risk for further clinical deterioration. Furthermore, it is not anticipated that the patient will be medically stable for discharge from the hospital within 2 midnights of admission.   * I certify that at the point of admission it is my clinical judgment that the patient will require inpatient hospital care spanning beyond 2 midnights from the point of admission due to high intensity of service, high risk for further deterioration and high frequency of surveillance required.*  Author: Isaiah Lever, NP 12/07/2023 12:09 PM  For on call review [email or web address].

## 2023-12-07 NOTE — Consult Note (Signed)
 Orthopaedic Trauma Service (OTS) Consult   Patient ID: Jennifer [name] MRN: [id number] DOB/AGE: 1946-01-26 78 y.o.   Reason for Consult: Comminuted right humerus fracture Referring Physician: Isaiah Lever, NP ([facility])   HPI: Jennifer [name] is an 78 y.o. RHD female who sustained a fall while at home on 12/04/2023.  Patient was sitting in a recliner when a friend came over to bring her some milk.  She had just taken some medication.  She had gotten out really quickly and got dizzy and fell.  She struck her face and her right arm.  It took her a while to make her way back to her bedroom.  She had immediate onset of right arm pain but did not think her injury was too severe.  Following morning she was still having a tremendous amount of difficulty along with increased pain.  Ultimately after she got some things arranged at home she was able to present to the emergency department on the evening of 12/06/2023.  Patient does have a notable medical history she is on Eliquis  for a flutter last dose was yesterday.  She does have CAD with severe aortic stenosis.  She is supposed to actually have an evaluation this week for aortic valve replacement Additional medical issues include BPPV with intermittent vertigo, heart failure, amyloidosis, hypothyroidism, fibromyalgia, chronic anemia, chronic cystitis, chronic back pain with spinal stenosis  Patient seen and evaluated in the emergency department.  She is very pleasant extensive periorbital ecchymosis bilaterally.  Right upper extremity has been splinted and is in a sling.  She is comfortable otherwise and denies any additional injuries other than her face and her right arm  Patient denies any numbness or tingling to her right upper extremity  She lives by herself  Past Medical History:  Diagnosis Date   Acute meniscal tear of left knee    FOLLOWED BY DR GIAFFREY   Anemia    Aortic stenosis    Ascending aorta dilation (HCC)     Atrial flutter (HCC)    AV block, Mobitz 1    BPPV (benign paroxysmal positional vertigo)    CAD (coronary artery disease)    Chronic bladder pain    Chronic fatigue syndrome    Chronic low back pain    W/ RIGHT LEG PAIN AND NUMBNESS   Cystitis, chronic    Fibromyalgia    GERD (gastroesophageal reflux disease)    Hiatal hernia    Hypertension    IC (interstitial cystitis)    LBBB (left bundle branch block)    transient hx   OSA (obstructive sleep apnea)    per pt study yrs ago-- moderate osa ,  intolerant cpap   Pinched vertebral nerve    bilateral L2 -- L3 and L3 -- L4-/  epidural injection's treatment , PT and pain clince   PONV (postoperative nausea and vomiting)    severe   PVC's (premature ventricular contractions)    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    Self-catheterizes urinary bladder    Spinal stenosis, lumbar region with neurogenic claudication    Wears glasses    Wears partial dentures    upper and lower    Past Surgical History:  Procedure Laterality Date   ABDOMINAL AORTOGRAM N/A 09/16/2020   Procedure: ABDOMINAL AORTOGRAM;  Surgeon: Darron Deatrice LABOR, MD;  Location: [facility];  Service: Cardiovascular;  Laterality: N/A;   ABDOMINAL  HYSTERECTOMY  1980   w/ Left Salpingoophorectomy   BIOPSY  08/27/2021   Procedure: BIOPSY;  Surgeon: Rosalie Kitchens, MD;  Location: [facility];  Service: Gastroenterology;;   CARDIAC CATHETERIZATION  08-21-2001  dr victory sharps   normal coronary arteries and LVF   CARPAL TUNNEL RELEASE Bilateral 1986   CECALCYSTOPLASTY/  APPENDECTOMY  1984   at [facility]   CHOLECYSTECTOMY  1992   CYSTO WITH HYDRODISTENSION N/A 01/12/2016   Procedure: CYSTOSCOPY/HYDRODISTENSION;  Surgeon: Arlena Gal, MD;  Location: [facility];  Service: Urology;  Laterality: N/A;   CYSTO WITH HYDRODISTENSION N/A 08/30/2016   Procedure: CYSTOSCOPY/HYDRODISTENSION OF BLADDER INJECTION OF  MARCAINE /PYRIDIUM ;  Surgeon: Gal Arlena, MD;  Location: [facility];  Service: Urology;  Laterality: N/A;   ESOPHAGOGASTRODUODENOSCOPY (EGD) WITH PROPOFOL  N/A 08/27/2021   Procedure: ESOPHAGOGASTRODUODENOSCOPY (EGD) WITH PROPOFOL ;  Surgeon: Rosalie Kitchens, MD;  Location: [facility];  Service: Gastroenterology;  Laterality: N/A;   LEFT HEART CATH AND CORONARY ANGIOGRAPHY N/A 09/16/2020   Procedure: LEFT HEART CATH AND CORONARY ANGIOGRAPHY;  Surgeon: Darron Deatrice LABOR, MD;  Location: [facility];  Service: Cardiovascular;  Laterality: N/A;   MULTIPLE CYSTO/  HYDRODISTENTION/  INSTILLATION THERAPY  last one 08-19-2009   NASAL SINUS SURGERY  1987   TONSILLECTOMY  as child   VAGINAL GROWTH REMOVED  as teen    Family History  Problem Relation Age of Onset   CAD Father    Asthma Father    Hypertension Father    Alzheimer's disease Father    Emphysema Father    COPD Father    Heart failure Mother    Peripheral vascular disease Mother    Stroke Mother    Hypertension Mother    Colonic polyp Mother    Cancer Brother        melanoma    Social History:  reports that she has never smoked. She has never used smokeless tobacco. She reports that she does not drink alcohol and does not use drugs.  Allergies:  Allergies  Allergen Reactions   Moxifloxacin Hives, Shortness Of Breath, Swelling and Rash    Rash was severe, caused tremors, ans skin became BLOODY RED   Quinolones Anaphylaxis   Sulfa Antibiotics Hives and Rash   Furosemide  Other (See Comments)    CANNOT TAKE DUE TO Interstitial cystitis   Aspirin  Nausea Only and Other (See Comments)    GI and stomach upset   Doxazosin      constipation   Duloxetine     Other reaction(s): Other (See Comments)   Duloxetine Hcl Nausea Only and Other (See Comments)    Vertigo and heart racing also    Hydrochlorothiazide Other (See Comments)    Headaches    Ibuprofen Nausea Only and Other (See Comments)    GI upset    Nebivolol  Hcl     Other reaction(s): extreme fatigue   Other Other (See Comments)   Ranexa  [Ranolazine ]     Constipation/heart pounding   Robaxin [Methocarbamol] Other (See Comments)    Make me feel flu-like symptoms   Topiramate Itching   Xarelto  [Rivaroxaban ]     Back pain   Zofran  [Ondansetron  Hcl] Other (See Comments)    Headaches    Codeine Hives, Nausea And Vomiting and Rash   Dimethyl Sulfoxide Hives and Itching   Macrodantin  [Nitrofurantoin  Macrocrystal] Itching, Nausea And Vomiting and Rash    Abdominal and chest pain, also- blood-red skin, also   Nitrofurantoin  Diarrhea, Itching, Rash and Other (  See Comments)    GI/stomach upset, sweating, chest pain, and skin turned bloody red   Tape Rash   Yellow Dye #6 (Sunset Yellow) Rash   Current Outpatient Medications  Medication Instructions   acetaminophen  (TYLENOL ) 500 mg, 2 times daily PRN   aluminum hydroxide-magnesium  carbonate (GAVISCON) 95-358 MG/15ML SUSP As needed   apixaban  (ELIQUIS ) 5 mg, Oral, 2 times daily   cyanocobalamin  (VITAMIN B12) 1,000 mcg, Daily   dextromethorphan -guaiFENesin  (MUCINEX  DM) 30-600 MG 12hr tablet 1 tablet, Every 12 hours PRN   ezetimibe  (ZETIA ) 10 mg, Oral, Daily   fexofenadine (ALLEGRA) 60 mg, Daily PRN   fluconazole  (DIFLUCAN ) 100 mg, Daily   furosemide  (LASIX ) 40 MG tablet May take an extra dose once daily as needed for swelling   gabapentin  (NEURONTIN ) 300 mg, Daily at bedtime   hydrALAZINE  (APRESOLINE ) 50 mg, Oral, 3 times daily   HYDROcodone -acetaminophen  (NORCO) 7.5-325 MG tablet 1 tablet, 3 times daily PRN   hydrocortisone  (ANUSOL -HC) 25 mg, 2 times daily PRN   isosorbide  dinitrate (ISORDIL ) 10 mg, Oral, 3 times daily   losartan  (COZAAR ) 100 mg, Oral, Daily   meclizine  (ANTIVERT ) 25 mg, 3 times daily PRN   Methen-Hyosc-Meth Blue-Na Phos (UROGESIC-BLUE) 81.6 MG TABS 1 tablet, Every 6 hours PRN   metoprolol  tartrate (LOPRESSOR ) 25 mg, Oral, 2 times daily   nitroGLYCERIN  (NITROSTAT )  0.4 MG SL tablet PLACE 1 TABLET UNDER THE TONGE EVERY 5 MINUTES AS NEEDED FOR CHEST PAIN *TAKE UP TO 3 TABLETS*   nystatin  cream (MYCOSTATIN ) Topical, 2 times daily   ondansetron  (ZOFRAN -ODT) 4 mg, Every 8 hours PRN   pantoprazole  (PROTONIX ) 40 mg, 2 times daily   polyethylene glycol powder (GLYCOLAX/MIRALAX) 17 g, Daily PRN   rosuvastatin  (CRESTOR ) 5 mg, Oral, 3 times weekly   senna (SENOKOT) 8.6 MG tablet 1 tablet, As needed   spironolactone  (ALDACTONE ) 25 mg, Oral, Daily   sucralfate  (CARAFATE ) 1 g, Oral, 3 times daily with meals & bedtime   triamcinolone  cream (KENALOG ) 0.1 % 1 application , 2 times daily PRN    Medications: I have reviewed the patient's current medications.   Results for orders placed or performed during the hospital encounter of 12/06/23 (from the past 48 hours)  CBC with Differential     Status: Abnormal   Collection Time: 12/06/23 10:12 PM  Result Value Ref Range   WBC 13.8 (H) 4.0 - 10.5 K/uL   RBC 2.79 (L) 3.87 - 5.11 MIL/uL   Hemoglobin 8.2 (L) 12.0 - 15.0 g/dL   HCT 74.1 (L) 63.9 - 53.9 %   MCV 92.5 80.0 - 100.0 fL   MCH 29.4 26.0 - 34.0 pg   MCHC 31.8 30.0 - 36.0 g/dL   RDW 86.7 88.4 - 84.4 %   Platelets 256 150 - 400 K/uL   nRBC 0.2 0.0 - 0.2 %   Neutrophils Relative % 82 %   Neutro Abs 11.4 (H) 1.7 - 7.7 K/uL   Lymphocytes Relative 8 %   Lymphs Abs 1.0 0.7 - 4.0 K/uL   Monocytes Relative 9 %   Monocytes Absolute 1.3 (H) 0.1 - 1.0 K/uL   Eosinophils Relative 0 %   Eosinophils Absolute 0.0 0.0 - 0.5 K/uL   Basophils Relative 0 %   Basophils Absolute 0.0 0.0 - 0.1 K/uL   Immature Granulocytes 1 %   Abs Immature Granulocytes 0.12 (H) 0.00 - 0.07 K/uL    Comment: Performed at [facility], [address], [place], [place] [address]  Comprehensive  metabolic panel     Status: Abnormal   Collection Time: 12/06/23 10:12 PM  Result Value Ref Range   Sodium 138 135 - 145 mmol/L   Potassium 4.4 3.5 - 5.1 mmol/L   Chloride 101 98 - 111 mmol/L    CO2 24 22 - 32 mmol/L   Glucose, Bld 121 (H) 70 - 99 mg/dL    Comment: Glucose reference range applies only to samples taken after fasting for at least 8 hours.   BUN 36 (H) 8 - 23 mg/dL   Creatinine, Ser 8.49 (H) 0.44 - 1.00 mg/dL   Calcium  8.7 (L) 8.9 - 10.3 mg/dL   Total Protein 6.6 6.5 - 8.1 g/dL   Albumin  3.7 3.5 - 5.0 g/dL   AST 71 (H) 15 - 41 U/L   ALT 37 0 - 44 U/L   Alkaline Phosphatase 52 38 - 126 U/L   Total Bilirubin 1.6 (H) 0.0 - 1.2 mg/dL   GFR, Estimated 36 (L) >60 mL/min    Comment: (NOTE) Calculated using the CKD-EPI Creatinine Equation (2021)    Anion gap 13 5 - 15    Comment: Performed at [facility], [address], [place], [place] [address]  Protime-INR     Status: Abnormal   Collection Time: 12/06/23 10:12 PM  Result Value Ref Range   Prothrombin Time 19.9 (H) 11.4 - 15.2 seconds   INR 1.6 (H) 0.8 - 1.2    Comment: (NOTE) INR goal varies based on device and disease states. Performed at [facility], [address] [place], [place] [address]   Urinalysis, Routine w reflex microscopic -Urine, Clean Catch     Status: Abnormal   Collection Time: 12/07/23  1:52 AM  Result Value Ref Range   Color, Urine YELLOW YELLOW   APPearance HAZY (A) CLEAR   Specific Gravity, Urine 1.043 (H) 1.005 - 1.030   pH 5.0 5.0 - 8.0   Glucose, UA NEGATIVE NEGATIVE mg/dL   Hgb urine dipstick MODERATE (A) NEGATIVE   Bilirubin Urine NEGATIVE NEGATIVE   Ketones, ur 20 (A) NEGATIVE mg/dL   Protein, ur NEGATIVE NEGATIVE mg/dL   Nitrite POSITIVE (A) NEGATIVE   Leukocytes,Ua SMALL (A) NEGATIVE   RBC / HPF 0-5 0 - 5 RBC/hpf   WBC, UA 21-50 0 - 5 WBC/hpf   Bacteria, UA MANY (A) NONE SEEN   Squamous Epithelial / HPF 0-5 0 - 5 /HPF   Mucus PRESENT    Hyaline Casts, UA PRESENT     Comment: Performed at [facility], [address], [place], [place] [address]  CBC     Status: Abnormal   Collection Time: 12/07/23  9:07 AM  Result Value Ref Range   WBC 9.6 4.0 -  10.5 K/uL   RBC 2.27 (L) 3.87 - 5.11 MIL/uL   Hemoglobin 6.6 (LL) 12.0 - 15.0 g/dL    Comment: REPEATED TO VERIFY This critical result has been called to FARREL LARCH, RN by [employer] on 12/07/2023 09:41:32, and has been read back.    HCT 21.4 (L) 36.0 - 46.0 %   MCV 94.3 80.0 - 100.0 fL   MCH 29.1 26.0 - 34.0 pg   MCHC 30.8 30.0 - 36.0 g/dL   RDW 86.5 88.4 - 84.4 %   Platelets 203 150 - 400 K/uL   nRBC 0.0 0.0 - 0.2 %    Comment: Performed at [facility], [address] [place], [place] [address]  ABO/Rh  Status: None   Collection Time: 12/07/23  9:07 AM  Result Value Ref Range   ABO/RH(D)      O POS Performed at [facility], [address] [place], [place] [address]   Hemoglobin and hematocrit, blood     Status: Abnormal   Collection Time: 12/07/23  9:53 AM  Result Value Ref Range   Hemoglobin 7.1 (L) 12.0 - 15.0 g/dL   HCT 77.0 (L) 63.9 - 53.9 %    Comment: Performed at [facility], [address] [place], [place] [address]  Type and screen [facility]     Status: None (Preliminary result)   Collection Time: 12/07/23  9:55 AM  Result Value Ref Range   ABO/RH(D) O POS    Antibody Screen NEG    Sample Expiration 12/10/2023,2359    Unit Number [id number]    Blood Component Type RED CELLS,LR    Unit division 00    Status of Unit ALLOCATED    Transfusion Status OK TO TRANSFUSE    Crossmatch Result Compatible    Unit Number [id number]    Blood Component Type RED CELLS,LR    Unit division 00    Status of Unit ISSUED    Transfusion Status OK TO TRANSFUSE    Crossmatch Result      Compatible Performed at [facility], [address] [place], [place] [address]   Prepare RBC (crossmatch)     Status: None   Collection Time: 12/07/23 10:55 AM  Result Value Ref Range   Order Confirmation      ORDER PROCESSED BY BLOOD BANK Performed at [facility], [address] [place], [place] [address]     CT  SHOULDER RIGHT WO CONTRAST Result Date: 12/07/2023 CLINICAL DATA:  Right shoulder injury after fall. EXAM: CT OF THE UPPER RIGHT EXTREMITY WITHOUT CONTRAST TECHNIQUE: Multidetector CT imaging of the upper right extremity was performed according to the standard protocol. RADIATION DOSE REDUCTION: This exam was performed according to the departmental dose-optimization program which includes automated exposure control, adjustment of the mA and/or kV according to patient size and/or use of iterative reconstruction technique. COMPARISON:  December 06, 2023. FINDINGS: No dislocation is noted involving the glenohumeral joint. Visualized ribs, scapula and clavicle are unremarkable. There appears to be a severely displaced and comminuted fracture involving the proximal right humeral shaft, which extends proximally into the proximal right humeral head and neck. IMPRESSION: There appears to be a severely displaced and comminuted fracture involving the proximal right humeral shaft which extends proximally into the proximal right humeral head and neck. Electronically Signed   By: Lynwood Landy Raddle M.D.   On: 12/07/2023 09:49   CT CHEST ABDOMEN PELVIS W CONTRAST Result Date: 12/07/2023 CLINICAL DATA:  Polytrauma, blunt Fall, severe bruising, on blood thinners. Fall. Chest and right arm pain. EXAM: CT CHEST, ABDOMEN, AND PELVIS WITH CONTRAST TECHNIQUE: Multidetector CT imaging of the chest, abdomen and pelvis was performed following the standard protocol during bolus administration of intravenous contrast. RADIATION DOSE REDUCTION: This exam was performed according to the departmental dose-optimization program which includes automated exposure control, adjustment of the mA and/or kV according to patient size and/or use of iterative reconstruction technique. CONTRAST:  75mL OMNIPAQUE  IOHEXOL  350 MG/ML SOLN COMPARISON:  09/15/2020 FINDINGS: CT CHEST FINDINGS Cardiovascular: Heart is mildly enlarged. Scattered coronary artery and  aortic atherosclerosis. No evidence of aortic aneurysm. Mediastinum/Nodes: No mediastinal, hilar, or axillary adenopathy. Trachea and esophagus are  unremarkable. Thyroid  unremarkable. Lungs/Pleura: No confluent opacities, effusions or pneumothorax. Musculoskeletal: Stranding within the subcutaneous soft tissues of the right breast, likely hematoma. Comminuted, displaced right humeral fracture noted as seen on earlier plain films. No additional acute bony abnormality. CT ABDOMEN PELVIS FINDINGS Hepatobiliary: No hepatic injury or perihepatic hematoma. Prior cholecystectomy. Pancreas: No focal abnormality or ductal dilatation. Spleen: No splenic injury or perisplenic hematoma. Adrenals/Urinary Tract: No adrenal hemorrhage or renal injury identified. Bladder is unremarkable. Stomach/Bowel: Normal appendix. Sigmoid diverticulosis. No active diverticulitis. Stomach and small bowel decompressed, unremarkable. Vascular/Lymphatic: Aortic atherosclerosis. No evidence of aneurysm or adenopathy. Reproductive: Prior hysterectomy.  No adnexal masses. Other: No free fluid or free air. Musculoskeletal: Leftward scoliosis in the lumbar spine with associated degenerative changes. No acute bony abnormality. IMPRESSION: Stranding within the subcutaneous soft tissues of the right breast/chest wall most likely reflecting hematoma. Otherwise no significant traumatic injury in the chest, abdomen or pelvis. Comminuted, displaced right humeral fracture as seen on earlier plain films. Cardiomegaly, coronary artery disease.  Aortic atherosclerosis. Sigmoid diverticulosis. Electronically Signed   By: Franky Crease M.D.   On: 12/07/2023 00:16   CT Cervical Spine Wo Contrast Result Date: 12/07/2023 CLINICAL DATA:  Fall, neck trauma EXAM: CT CERVICAL SPINE WITHOUT CONTRAST TECHNIQUE: Multidetector CT imaging of the cervical spine was performed without intravenous contrast. Multiplanar CT image reconstructions were also generated. RADIATION DOSE  REDUCTION: This exam was performed according to the departmental dose-optimization program which includes automated exposure control, adjustment of the mA and/or kV according to patient size and/or use of iterative reconstruction technique. COMPARISON:  None Available. FINDINGS: Alignment: No subluxation.  Loss of cervical lordosis. Skull base and vertebrae: No acute fracture. No primary bone lesion or focal pathologic process. Soft tissues and spinal canal: No prevertebral fluid or swelling. No visible canal hematoma. Disc levels: Moderate bilateral degenerative facet disease. Fusion across multiple facet joints, left greater than right. Mild degenerative disc disease. Multilevel bilateral neural foraminal narrowing. Upper chest: No acute findings Other: None IMPRESSION: Degenerative disc and facet disease.  No acute bony abnormality. Electronically Signed   By: Franky Crease M.D.   On: 12/07/2023 00:12   CT Head Wo Contrast Result Date: 12/07/2023 CLINICAL DATA:  Fall, head trauma, on blood thinners. EXAM: CT HEAD WITHOUT CONTRAST TECHNIQUE: Contiguous axial images were obtained from the base of the skull through the vertex without intravenous contrast. RADIATION DOSE REDUCTION: This exam was performed according to the departmental dose-optimization program which includes automated exposure control, adjustment of the mA and/or kV according to patient size and/or use of iterative reconstruction technique. COMPARISON:  09/15/2020 FINDINGS: Brain: No acute intracranial abnormality. Specifically, no hemorrhage, hydrocephalus, mass lesion, acute infarction, or significant intracranial injury. There is atrophy and chronic small vessel disease changes. Vascular: No hyperdense vessel or unexpected calcification. Skull: No acute calvarial abnormality. Sinuses/Orbits: No acute findings Other: Soft tissue swelling in the forehead. IMPRESSION: Atrophy, chronic microvascular disease. No acute intracranial abnormality.  Electronically Signed   By: Franky Crease M.D.   On: 12/07/2023 00:10   DG Shoulder Right Result Date: 12/06/2023 CLINICAL DATA:  Clemens, right arm pain EXAM: RIGHT SHOULDER - 2+ VIEW; RIGHT ELBOW - 2 VIEW; RIGHT HUMERUS - 2+ VIEW COMPARISON:  None Available. FINDINGS: Right shoulder: Internal and external views of the right shoulder are obtained. There is a comminuted spiral fracture of the proximal right humeral diaphysis, with varus angulation at the fracture site. Glenohumeral and acromioclavicular joints remain in anatomic alignment. Diffuse soft tissue swelling. Right  humerus: Frontal and lateral views demonstrate comminuted spiral proximal right humeral diaphyseal fracture, with varus angulation at the fracture site. Alignment of the right shoulder and elbow remains anatomic. Diffuse soft tissue swelling. Right elbow: Frontal and lateral views are obtained. There are no acute displaced fractures. Alignment is anatomic. Mild osteoarthritis. Diffuse subcutaneous edema. IMPRESSION: 1. Comminuted displaced spiral fracture of the proximal right humeral diaphysis, with varus angulation at the fracture site. 2. Diffuse soft tissue swelling throughout the right upper extremity. Electronically Signed   By: Ozell Daring M.D.   On: 12/06/2023 22:58   DG Humerus Right Result Date: 12/06/2023 CLINICAL DATA:  Clemens, right arm pain EXAM: RIGHT SHOULDER - 2+ VIEW; RIGHT ELBOW - 2 VIEW; RIGHT HUMERUS - 2+ VIEW COMPARISON:  None Available. FINDINGS: Right shoulder: Internal and external views of the right shoulder are obtained. There is a comminuted spiral fracture of the proximal right humeral diaphysis, with varus angulation at the fracture site. Glenohumeral and acromioclavicular joints remain in anatomic alignment. Diffuse soft tissue swelling. Right humerus: Frontal and lateral views demonstrate comminuted spiral proximal right humeral diaphyseal fracture, with varus angulation at the fracture site. Alignment of the  right shoulder and elbow remains anatomic. Diffuse soft tissue swelling. Right elbow: Frontal and lateral views are obtained. There are no acute displaced fractures. Alignment is anatomic. Mild osteoarthritis. Diffuse subcutaneous edema. IMPRESSION: 1. Comminuted displaced spiral fracture of the proximal right humeral diaphysis, with varus angulation at the fracture site. 2. Diffuse soft tissue swelling throughout the right upper extremity. Electronically Signed   By: Ozell Daring M.D.   On: 12/06/2023 22:58   DG Elbow 2 Views Right Result Date: 12/06/2023 CLINICAL DATA:  Clemens, right arm pain EXAM: RIGHT SHOULDER - 2+ VIEW; RIGHT ELBOW - 2 VIEW; RIGHT HUMERUS - 2+ VIEW COMPARISON:  None Available. FINDINGS: Right shoulder: Internal and external views of the right shoulder are obtained. There is a comminuted spiral fracture of the proximal right humeral diaphysis, with varus angulation at the fracture site. Glenohumeral and acromioclavicular joints remain in anatomic alignment. Diffuse soft tissue swelling. Right humerus: Frontal and lateral views demonstrate comminuted spiral proximal right humeral diaphyseal fracture, with varus angulation at the fracture site. Alignment of the right shoulder and elbow remains anatomic. Diffuse soft tissue swelling. Right elbow: Frontal and lateral views are obtained. There are no acute displaced fractures. Alignment is anatomic. Mild osteoarthritis. Diffuse subcutaneous edema. IMPRESSION: 1. Comminuted displaced spiral fracture of the proximal right humeral diaphysis, with varus angulation at the fracture site. 2. Diffuse soft tissue swelling throughout the right upper extremity. Electronically Signed   By: Ozell Daring M.D.   On: 12/06/2023 22:58    Intake/Output    None      Review of Systems  Constitutional:  Negative for chills and fever.  Respiratory:  Negative for shortness of breath.   Cardiovascular:  Negative for chest pain and palpitations.   Musculoskeletal:        Right arm pain  Neurological:  Negative for tingling (No tingling or sensory changes in her right upper extremity) and sensory change.   Blood pressure (!) 156/53, pulse 69, temperature 98.4 F (36.9 C), temperature source Oral, resp. rate 18, SpO2 98%. Physical Exam Vitals and nursing note reviewed.  Constitutional:      General: She is not in acute distress.    Appearance: She is normal weight.     Comments: Very pleasant, no acute distress, outstanding sense of humor  HENT:  Head:     Comments: Extensive ecchymosis to her face, bilateral periorbital ecchymosis Cardiovascular:     Pulses:          Radial pulses are 2+ on the right side.       Dorsalis pedis pulses are 2+ on the right side and 2+ on the left side.  Pulmonary:     Effort: Pulmonary effort is normal. No prolonged expiration.  Musculoskeletal:     Comments:  Right upper extremity  Coaptation splint is in place Sling is in place Moderate swelling to forearm and hand Extremity is warm + Pulse No pain with passive stretching of the digits or wrist Radial, ulnar, median nerve motor and sensory function intact AIN and PIN motor function intact.   Patient is able to perform digit extension as well as wrist extension I did not remove splint No traumatic wounds noted  Neurological:     Mental Status: She is alert.  Psychiatric:        Behavior: Behavior is cooperative.      Assessment/Plan:  78 year old female ground-level fall with right proximal humerus and shaft fracture   - Ground-level fall  -Right proximal humerus and shaft fracture  Patient will need surgical stabilization to address her injury  Given her acute blood loss anemia we will hold on surgery until Monday.  This is a nonemergent procedure.  Would prefer to have her medically optimized before proceeding to the OR   Patient is in complete agreement with the plan   She was placed into a coaptation splint along  with a sling for comfort and to stabilize fracture fragments to further minimize blood loss   Ice as needed for swelling and pain control  - Pain management:  Multimodal  - ABL anemia/Hemodynamics  2 units of PRBCs ordered for today  CBC in the morning   Hold Eliquis   - Medical issues   Per primary  - DVT/PE prophylaxis:  On Eliquis  chronically.  Hold preoperatively and given her acute blood loss anemia  - ID:    preop antibiotics were reordered today her surgery  - Metabolic Bone Disease:  Will check vitamin D  levels.  Fracture is suggestive of fragility fracture  Will discuss DEXA scan with her in the outpatient setting  - Activity:  As above  - FEN/GI prophylaxis/Foley/Lines:  Okay to eat from Ortho standpoint   -Ex-fix/Splint care:  Continue with splint and sling  - Impediments to fracture healing:  Multiple medical comorbidities   - Dispo:  Plan for OR on Monday, 12/09/2023   Francis MICAEL Mt, PA-C [phone] (C) 12/07/2023, 1:19 PM  [facility] [address] [place] [address] [phone] (F)    After 5pm and on the weekends please log on to Amion, go to orthopaedics and the look under the [facility] Call for the provider(s) on call. You can also call our office at [phone] and then follow the prompts to be connected to the call team.

## 2023-12-07 NOTE — Progress Notes (Signed)
 Orthopedic Tech Progress Note Patient Details:  Jennifer [name] 08/18/1945 [id number]  Ortho Devices Type of Ortho Device: Sling immobilizer Ortho Device/Splint Location: R HUMERUS Ortho Device/Splint Interventions: Ordered, Application   Post Interventions Patient Tolerated: Well Instructions Provided: Care of device FOT . Humerus fx of the R arm. Sling provided and applied. She will be admitted.    Datron Brakebill L Ellyana Crigler 12/07/2023, 1:42 AM

## 2023-12-07 NOTE — Progress Notes (Signed)
 Consult request received for severely comminuted right proximal humerus fracture. Have discussed with the requesting MD patient's stability, pain control, and ongoing work up. I have reviewed x-rays and developed a provisional plan.   CT ordered. Full consultation to follow shortly. Plan for operative treatment later today. Keep NPO. Appreciate Medicine Service.  Ozell Bruch, MD [facility], [facility] [phone]

## 2023-12-07 NOTE — ED Notes (Signed)
 CCMD notified. Pt is on monitor.

## 2023-12-08 DIAGNOSIS — I2510 Atherosclerotic heart disease of native coronary artery without angina pectoris: Secondary | ICD-10-CM | POA: Diagnosis not present

## 2023-12-08 DIAGNOSIS — D649 Anemia, unspecified: Secondary | ICD-10-CM | POA: Diagnosis not present

## 2023-12-08 DIAGNOSIS — I43 Cardiomyopathy in diseases classified elsewhere: Secondary | ICD-10-CM | POA: Diagnosis not present

## 2023-12-08 DIAGNOSIS — I350 Nonrheumatic aortic (valve) stenosis: Secondary | ICD-10-CM

## 2023-12-08 DIAGNOSIS — S42301A Unspecified fracture of shaft of humerus, right arm, initial encounter for closed fracture: Secondary | ICD-10-CM | POA: Diagnosis not present

## 2023-12-08 DIAGNOSIS — S42201D Unspecified fracture of upper end of right humerus, subsequent encounter for fracture with routine healing: Secondary | ICD-10-CM

## 2023-12-08 DIAGNOSIS — N179 Acute kidney failure, unspecified: Secondary | ICD-10-CM

## 2023-12-08 DIAGNOSIS — Z01810 Encounter for preprocedural cardiovascular examination: Secondary | ICD-10-CM | POA: Diagnosis not present

## 2023-12-08 LAB — COMPREHENSIVE METABOLIC PANEL WITH GFR
ALT: 27 U/L (ref 0–44)
AST: 34 U/L (ref 15–41)
Albumin: 2.8 g/dL — ABNORMAL LOW (ref 3.5–5.0)
Alkaline Phosphatase: 42 U/L (ref 38–126)
Anion gap: 8 (ref 5–15)
BUN: 21 mg/dL (ref 8–23)
CO2: 26 mmol/L (ref 22–32)
Calcium: 8.1 mg/dL — ABNORMAL LOW (ref 8.9–10.3)
Chloride: 104 mmol/L (ref 98–111)
Creatinine, Ser: 0.91 mg/dL (ref 0.44–1.00)
GFR, Estimated: >60 mL/min (ref >60)
Glucose, Bld: 98 mg/dL (ref 70–99)
Potassium: 3.8 mmol/L (ref 3.5–5.1)
Sodium: 138 mmol/L (ref 135–145)
Total Bilirubin: 1.8 mg/dL — ABNORMAL HIGH (ref 0.0–1.2)
Total Protein: 5.4 g/dL — ABNORMAL LOW (ref 6.5–8.1)

## 2023-12-08 LAB — CBC
HCT: 25.2 % — ABNORMAL LOW (ref 36.0–46.0)
Hemoglobin: 8.2 g/dL — ABNORMAL LOW (ref 12.0–15.0)
MCH: 28.6 pg (ref 26.0–34.0)
MCHC: 32.5 g/dL (ref 30.0–36.0)
MCV: 87.8 fL (ref 80.0–100.0)
Platelets: 198 K/uL (ref 150–400)
RBC: 2.87 MIL/uL — ABNORMAL LOW (ref 3.87–5.11)
RDW: 16.9 % — ABNORMAL HIGH (ref 11.5–15.5)
WBC: 9.4 K/uL (ref 4.0–10.5)
nRBC: 0.3 % — ABNORMAL HIGH (ref 0.0–0.2)

## 2023-12-08 LAB — IRON AND TIBC
Iron: 40 ug/dL (ref 28–170)
Saturation Ratios: 14 % (ref 10.4–31.8)
TIBC: 293 ug/dL (ref 250–450)
UIBC: 253 ug/dL

## 2023-12-08 LAB — PREPARE RBC (CROSSMATCH)

## 2023-12-08 LAB — VITAMIN B12: Vitamin B-12: 443 pg/mL (ref 180–914)

## 2023-12-08 LAB — RETICULOCYTES
Immature Retic Fract: 30.6 % — ABNORMAL HIGH (ref 2.3–15.9)
RBC.: 2.93 MIL/uL — ABNORMAL LOW (ref 3.87–5.11)
Retic Count, Absolute: 78.5 K/uL (ref 19.0–186.0)
Retic Ct Pct: 2.7 % (ref 0.4–3.1)

## 2023-12-08 LAB — FOLATE: Folate: 12.5 ng/mL (ref >5.9)

## 2023-12-08 LAB — FERRITIN: Ferritin: 69 ng/mL (ref 11–307)

## 2023-12-08 MED ORDER — DM-GUAIFENESIN ER 30-600 MG PO TB12
1.0000 | ORAL_TABLET | Freq: Two times a day (BID) | ORAL | Status: DC | PRN
  Administered 2023-12-11: 1 via ORAL
  Filled 2023-12-08 (×3): qty 1

## 2023-12-08 MED ORDER — GUAIFENESIN ER 600 MG PO TB12
600.0000 mg | ORAL_TABLET | Freq: Two times a day (BID) | ORAL | Status: DC | PRN
  Administered 2023-12-08: 600 mg via ORAL
  Filled 2023-12-08: qty 1

## 2023-12-08 MED ORDER — GUAIFENESIN-DM 100-10 MG/5ML PO SYRP
5.0000 mL | ORAL_SOLUTION | ORAL | Status: DC | PRN
  Administered 2023-12-08: 5 mL via ORAL
  Filled 2023-12-08: qty 10

## 2023-12-08 MED ORDER — SODIUM CHLORIDE 0.9 % IV SOLN
1.0000 g | Freq: Three times a day (TID) | INTRAVENOUS | Status: DC
  Administered 2023-12-08 – 2023-12-09 (×2): 1 g via INTRAVENOUS
  Filled 2023-12-08 (×2): qty 20

## 2023-12-08 MED ORDER — VITAMIN D (ERGOCALCIFEROL) 1.25 MG (50000 UNIT) PO CAPS
50000.0000 [IU] | ORAL_CAPSULE | ORAL | Status: DC
  Administered 2023-12-08: 50000 [IU] via ORAL
  Filled 2023-12-08: qty 1

## 2023-12-08 MED ORDER — GUAIFENESIN 100 MG/5ML PO LIQD: 5.0000 mL | ORAL | Status: DC | PRN

## 2023-12-08 NOTE — Progress Notes (Addendum)
 [facility] Progress Note  Patient ID: Jennifer [name] MRN: [id number] DOB/AGE: 1945/09/27 78 y.o.  Subjective:  Doing okay this morning Did have a coughing fit around 2 AM which did exacerbate her pain Appropriate response to PRBC transfusion  Ready for surgery tomorrow  Labs show vitamin D  insufficiency  Concern for some polypharmacy that is contributing to her fall  ROS As above  Today's  total administered Morphine  Milligram Equivalents: 7.5 Yesterday's total administered Morphine  Milligram Equivalents: 12  Objective:   VITALS:   Vitals:   12/07/23 1735 12/07/23 2015 12/08/23 0500 12/08/23 0735  BP: (!) 122/20 (!) 150/64 (!) 156/59 (!) 172/68  Pulse: 65 60 (!) 57 (!) 51  Resp: 16 19  17   Temp: 98.8 F (37.1 C) 99.4 F (37.4 C) 98.3 F (36.8 C) 97.8 F (36.6 C)  TempSrc:  Oral Oral Oral  SpO2:  97% 99% 98%  Weight:      Height:        Estimated body mass index is 38.78 kg/m as calculated from the following:   Height as of this encounter: 5' 2 (1.575 m).   Weight as of this encounter: 96.2 kg.   Intake/Output      09/06 0701 09/07 0700 09/07 0701 09/08 0700   P.O. 240    I.V. (mL/kg) 535 (5.6)    Blood 670    Total Intake(mL/kg) 1445 (15)    Net +1445           LABS  Results for orders placed or performed during the hospital encounter of 12/06/23 (from the past 24 hours)  Prepare RBC (crossmatch)     Status: None   Collection Time: 12/07/23 10:55 AM  Result Value Ref Range   Order Confirmation      ORDER PROCESSED BY BLOOD BANK Performed at [facility], [address], [place], [place] [address]   VITAMIN D  25 Hydroxy (Vit-D Deficiency, Fractures)     Status: Abnormal   Collection Time: 12/07/23  3:48 PM  Result Value Ref Range   Vit D, 25-Hydroxy 23.70 (L) 30 - 100 ng/mL  Comprehensive metabolic panel     Status: Abnormal   Collection Time:  12/08/23  4:05 AM  Result Value Ref Range   Sodium 138 135 - 145 mmol/L   Potassium 3.8 3.5 - 5.1 mmol/L   Chloride 104 98 - 111 mmol/L   CO2 26 22 - 32 mmol/L   Glucose, Bld 98 70 - 99 mg/dL   BUN 21 8 - 23 mg/dL   Creatinine, Ser 9.08 0.44 - 1.00 mg/dL   Calcium  8.1 (L) 8.9 - 10.3 mg/dL   Total Protein 5.4 (L) 6.5 - 8.1 g/dL   Albumin  2.8 (L) 3.5 - 5.0 g/dL   AST 34 15 - 41 U/L   ALT 27 0 - 44 U/L   Alkaline Phosphatase 42 38 - 126 U/L   Total Bilirubin 1.8 (H) 0.0 - 1.2 mg/dL   GFR, Estimated >39 >39 mL/min   Anion gap 8 5 - 15  CBC     Status: Abnormal   Collection Time: 12/08/23  4:05 AM  Result Value Ref Range   WBC 9.4 4.0 - 10.5 K/uL   RBC 2.87 (L) 3.87 - 5.11 MIL/uL   Hemoglobin 8.2 (L) 12.0 -  15.0 g/dL   HCT 74.7 (L) 63.9 - 53.9 %   MCV 87.8 80.0 - 100.0 fL   MCH 28.6 26.0 - 34.0 pg   MCHC 32.5 30.0 - 36.0 g/dL   RDW 83.0 (H) 88.4 - 84.4 %   Platelets 198 150 - 400 K/uL   nRBC 0.3 (H) 0.0 - 0.2 %  Reticulocytes     Status: Abnormal   Collection Time: 12/08/23  4:05 AM  Result Value Ref Range   Retic Ct Pct 2.7 0.4 - 3.1 %   RBC. 2.93 (L) 3.87 - 5.11 MIL/uL   Retic Count, Absolute 78.5 19.0 - 186.0 K/uL   Immature Retic Fract 30.6 (H) 2.3 - 15.9 %     PHYSICAL EXAM:   Gen: Sitting up in bed, no acute distress, pleasant  Facial ecchymosis is stable Lungs: Unlabored Ext:       Right upper extremity  Coaptation splint is in place Sling is in place Swelling appears to be a little bit better than yesterday Extremity is warm + Pulse  No pain with passive stretching of the digits or wrist Radial, ulnar, median nerve motor and sensory function intact AIN and PIN motor function intact.   Patient is able to perform digit extension as well as wrist extension No traumatic wounds noted   Assessment/Plan: * Surgery Date in Future *   Principal Problem:   Right humeral fracture   Anti-infectives (From admission, onward)    Start     Dose/Rate Route  Frequency Ordered Stop   12/07/23 1202  Urogesic-Blue 81.6 MG TABS 81.6 mg  Status:  Discontinued        1 tablet Oral Every 6 hours PRN 12/07/23 1203 12/07/23 1229   12/07/23 0645  meropenem  (MERREM ) 1 g in sodium chloride  0.9 % 100 mL IVPB        1 g 200 mL/hr over 30 Minutes Intravenous Every 12 hours 12/07/23 0635       .  POD/HD#:   78 year old Jennifer [name] ground-level fall with right proximal humerus and shaft fracture    - Ground-level fall   -Right proximal humerus and shaft fracture             OR tomorrow for ORIF right proximal humerus Continue with splint and sling for now Ice and elevate as able She will be nonweightbearing postoperatively Will allow for gentle shoulder motion postoperatively but will be restricted from active shoulder abduction for about 6 weeks  She will have unrestricted range of motion of her elbow, forearm wrist and hand   Therapy evaluations postop  - Pain management:               Multimodal   - ABL anemia/Hemodynamics                              CBC in the morning     Will type and cross 2 additional units for the OR tomorrow                  Hold Eliquis    - Medical issues                Per primary   UTI   On meropenem    - DVT/PE prophylaxis:               On Eliquis  chronically.  Hold preoperatively and given her acute blood loss anemia   - ID:  Perioperative antibiotics--> patient on meropenem  should be adequate for perioperative coverage   - Metabolic Bone Disease:               Vitamin D  levels are in the insufficient range.  We will start supplementation               Will discuss DEXA scan with her in the outpatient setting   - Activity:               As above   - FEN/GI prophylaxis/Foley/Lines:               N.p.o. after midnight                -Ex-fix/Splint care:               Continue with splint and sling   - Impediments to fracture healing:               Multiple medical comorbidities                 - Dispo:               Plan for OR tomorrow     Francis MICAEL Mt, PA-C [phone] (C) 12/08/2023, 10:22 AM  [facility] [address] [place] [address] [phone] (F)    After 5pm and on the weekends please log on to Amion, go to orthopaedics and the look under the [facility] Call for the provider(s) on call. You can also call our office at [phone] and then follow the prompts to be connected to the call team.  Patient ID: Jennifer [name], Jennifer [name]   DOB: [birth year], 78 y.o.   MRN: [id number]

## 2023-12-08 NOTE — Progress Notes (Signed)
 PT Cancellation Note  Patient Details Name: Jennifer [name] MRN: [id number] DOB: [birth year]   Cancelled Treatment:    Reason Eval/Treat Not Completed: Other (comment)  For surgery tomorrow;   Will plan to work with pt postop;   Silvano Currier, PT  Acute Rehabilitation Services Office [phone] Secure Chat welcomed    Silvano VEAR Currier 12/08/2023, 3:20 PM

## 2023-12-08 NOTE — Consult Note (Signed)
 Cardiology Consultation   Patient ID: [name] MRN: [id number]; DOB: 1945-09-08  Admit date: 12/06/2023 Date of Consult: 12/08/2023  PCP:  Katina Pfeiffer, PA-C   [facility] Providers Cardiologist:  Stanly DELENA Leavens, MD  Electrophysiologist:  OLE ONEIDA HOLTS, MD       Patient Profile: Jennifer [name] is a 78 y.o. female with a hx of CAD, significant aortic stenosis, positive PYP scan for amyloidosis with negative cardiac MRI, OSA not on CPAP, atrial flutter on Eliquis , interstitial cystitis s/p bladder reconstruction and history of multidrug resistant cystitis and history of iron  deficiency anemia who is being seen 12/08/2023 for the evaluation of preoperative clearance at the request of Dr. Cherlyn.  History of Present Illness: Jennifer [name] is a 78 year old female with past medical history of CAD, significant aortic stenosis, positive PYP scan for amyloidosis with negative cardiac MRI, OSA not on CPAP, atrial flutter on Eliquis , interstitial cystitis s/p bladder reconstruction and history of multidrug resistant cystitis and history of iron  deficiency anemia.  She had an abnormal coronary CTA in June 2022.  Subsequent cardiac catheterization performed on 09/16/2020 revealed a 50% mid LAD lesion, 60% D3 lesion, 90% distal LAD lesion.  The mid to distal artery is very tortuous suggestive of hypertensive disease, the distal LAD is diffusely diseased and not not amenable to PCI due to high risk for dissection as well as sidebranch closure.  Medical therapy was recommended.  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  Cardiac MRI performed on 07/25/2022 showed no evidence of cardiac amyloidosis, moderate asymmetric LVH not meeting criteria for hypertrophic cardiomyopathy, dilated ascending aorta measuring at 40 mm.  Echocardiogram obtained on 02/14/2023 showed EF 60 to 65%, severely calcified aortic valve with moderate aortic stenosis, severe AS by 2D planimetry.   Mean aortic valve gradient 35 mmHg, peak aortic valve gradient 56.6 mmHg, aortic valve area 0.89 cm.  She was last seen by Dr. Arnetha in February 2025, due to progressive aortic valve stenosis, patient was referred to structural heart clinic to be evaluated for TAVR.  She has significant polyneuropathy, therefore cannot completely rule out the possibility of cardiac amyloidosis, genetic testing was recommended.  She was initially scheduled to see Dr. Verlin in March however canceled multiple consultation visits, she is currently scheduled to see Dr. Wendel next week.  She lives by herself on the third floor of her apartment.  She does not do much strenuous activity due to significant lumbar stenosis, sciatica and polyneuropathy.  She does feel her heart pounding whenever she climb up to the third floor, however has no problem walking on flat ground.  She does not do more than 4 metabolic activity due to her back issue.  Her neighbor help out was grocery purchase.  She was in her usual state of health when she sustained a fall on 12/04/2023.  According to the patient, she was getting up too fast from her recliner and it fell into the windowsill.  She sustained significant bruising of her right arm, shoulder and her face.  CT of the head showed no acute finding.  CT of the chest abdomen pelvis showed comminuted displaced right humeral fracture.  Same fracture was seen on the CT right shoulder.  Patient has been seen by orthopedic surgery who plan to take the patient to the operating room tomorrow.  She is anemic with hemoglobin of 6.6 yesterday, and received 2 units of packed red blood cell.  She has positive nitrite and many bacteria.  Cardiology service consulted for preop clearance   Past Medical History:  Diagnosis Date   Acute meniscal tear of left knee    FOLLOWED BY DR GIAFFREY   Anemia    Aortic stenosis    Ascending aorta dilation (HCC)    Atrial flutter (HCC)    AV block, Mobitz 1     BPPV (benign paroxysmal positional vertigo)    CAD (coronary artery disease)    Chronic bladder pain    Chronic fatigue syndrome    Chronic low back pain    W/ RIGHT LEG PAIN AND NUMBNESS   Cystitis, chronic    Fibromyalgia    GERD (gastroesophageal reflux disease)    Hiatal hernia    Hypertension    IC (interstitial cystitis)    LBBB (left bundle branch block)    transient hx   OSA (obstructive sleep apnea)    per pt study yrs ago-- moderate osa ,  intolerant cpap   Pinched vertebral nerve    bilateral L2 -- L3 and L3 -- L4-/  epidural injection's treatment , PT and pain clince   PONV (postoperative nausea and vomiting)    severe   PVC's (premature ventricular contractions)    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    Self-catheterizes urinary bladder    Spinal stenosis, lumbar region with neurogenic claudication    Wears glasses    Wears partial dentures    upper and lower    Past Surgical History:  Procedure Laterality Date   ABDOMINAL AORTOGRAM N/A 09/16/2020   Procedure: ABDOMINAL AORTOGRAM;  Surgeon: Darron Deatrice LABOR, MD;  Location: [facility];  Service: Cardiovascular;  Laterality: N/A;   ABDOMINAL HYSTERECTOMY  1980   w/ Left Salpingoophorectomy   BIOPSY  08/27/2021   Procedure: BIOPSY;  Surgeon: Rosalie Kitchens, MD;  Location: [facility] ENDOSCOPY;  Service: Gastroenterology;;   CARDIAC CATHETERIZATION  08-21-2001  dr victory sharps   normal coronary arteries and LVF   CARPAL TUNNEL RELEASE Bilateral 1986   CECALCYSTOPLASTY/  APPENDECTOMY  1984   at [facility]   CHOLECYSTECTOMY  1992   CYSTO WITH HYDRODISTENSION N/A 01/12/2016   Procedure: CYSTOSCOPY/HYDRODISTENSION;  Surgeon: Arlena Gal, MD;  Location: [facility];  Service: Urology;  Laterality: N/A;   CYSTO WITH HYDRODISTENSION N/A 08/30/2016   Procedure: CYSTOSCOPY/HYDRODISTENSION OF BLADDER INJECTION OF MARCAINE /PYRIDIUM ;  Surgeon: Gal Arlena, MD;   Location: [facility];  Service: Urology;  Laterality: N/A;   ESOPHAGOGASTRODUODENOSCOPY (EGD) WITH PROPOFOL  N/A 08/27/2021   Procedure: ESOPHAGOGASTRODUODENOSCOPY (EGD) WITH PROPOFOL ;  Surgeon: Rosalie Kitchens, MD;  Location: [facility] ENDOSCOPY;  Service: Gastroenterology;  Laterality: N/A;   LEFT HEART CATH AND CORONARY ANGIOGRAPHY N/A 09/16/2020   Procedure: LEFT HEART CATH AND CORONARY ANGIOGRAPHY;  Surgeon: Darron Deatrice LABOR, MD;  Location: [facility];  Service: Cardiovascular;  Laterality: N/A;   MULTIPLE CYSTO/  HYDRODISTENTION/  INSTILLATION THERAPY  last one 08-19-2009   NASAL SINUS SURGERY  1987   TONSILLECTOMY  as child   VAGINAL GROWTH REMOVED  as teen     Home Medications:  Prior to Admission medications   Medication Sig Start Date End Date Taking? Authorizing Provider  acetaminophen  (TYLENOL ) 500 MG tablet Take 500 mg by mouth 2 (two) times daily as needed (for pain).   Yes [provider]  aluminum hydroxide-magnesium  carbonate (GAVISCON) 95-358 MG/15ML SUSP Take by mouth as needed for indigestion or heartburn. 01/23/17  Yes [provider]  apixaban  (ELIQUIS ) 5 MG TABS tablet Take 1 tablet (5 mg total) by mouth 2 (two) times daily. 09/11/23  Yes Chandrasekhar, Mahesh A, MD  dextromethorphan -guaiFENesin  (MUCINEX  DM) 30-600 MG 12hr tablet Take 1 tablet by mouth every 12 (twelve) hours as needed for cough.   Yes [provider]  ezetimibe  (ZETIA ) 10 MG tablet Take 1 tablet (10 mg total) by mouth daily. 06/10/23  Yes Chandrasekhar, Mahesh A, MD  fexofenadine (ALLEGRA) 60 MG tablet Take 60 mg by mouth daily as needed for allergies. 01/06/14  Yes [provider]  furosemide  (LASIX ) 40 MG tablet May take an extra dose once daily as needed for swelling 05/27/23  Yes Chandrasekhar, Mahesh A, MD  gabapentin  (NEURONTIN ) 300 MG capsule Take 300 mg by mouth at bedtime. 07/28/21  Yes [provider]  hydrALAZINE  (APRESOLINE ) 50 MG tablet TAKE  1 TABLET BY MOUTH THREE TIMES A DAY 06/07/23  Yes Chandrasekhar, Mahesh A, MD  HYDROcodone -acetaminophen  (NORCO) 7.5-325 MG tablet Take 1 tablet by mouth 3 (three) times daily as needed.   Yes [provider]  isosorbide  dinitrate (ISORDIL ) 10 MG tablet TAKE 1 TABLET BY MOUTH THREE TIMES A DAY 06/07/23  Yes Chandrasekhar, Mahesh A, MD  losartan  (COZAAR ) 100 MG tablet TAKE 1 TABLET BY MOUTH EVERY DAY 08/15/23  Yes Chandrasekhar, Mahesh A, MD  meclizine  (ANTIVERT ) 25 MG tablet Take 25 mg by mouth 3 (three) times daily as needed for dizziness.   Yes [provider]  metoprolol  tartrate (LOPRESSOR ) 25 MG tablet TAKE 1 TABLET BY MOUTH TWICE A DAY 08/15/23  Yes Chandrasekhar, Mahesh A, MD  nitroGLYCERIN  (NITROSTAT ) 0.4 MG SL tablet PLACE 1 TABLET UNDER THE TONGE EVERY 5 MINUTES AS NEEDED FOR CHEST PAIN *TAKE UP TO 3 TABLETS* 02/21/23  Yes Chandrasekhar, Mahesh A, MD  nystatin  cream (MYCOSTATIN ) Apply topically 2 (two) times daily. 08/04/20  Yes Eben Reyes BROCKS, MD  ondansetron  (ZOFRAN -ODT) 4 MG disintegrating tablet Take 4 mg by mouth every 8 (eight) hours as needed for nausea (dissolve orally). 07/01/17  Yes [provider]  pantoprazole  (PROTONIX ) 40 MG tablet Take 40 mg by mouth 2 (two) times daily. 04/15/23  Yes [provider]  polyethylene glycol powder (GLYCOLAX/MIRALAX) 17 GM/SCOOP powder Take 17 g by mouth daily as needed for mild constipation.   Yes [provider]  spironolactone  (ALDACTONE ) 25 MG tablet Take 1 tablet (25 mg total) by mouth daily. 09/17/23  Yes Chandrasekhar, Mahesh A, MD  sucralfate  (CARAFATE ) 1 g tablet Take 1 tablet (1 g total) by mouth 4 (four) times daily -  with meals and at bedtime. Patient taking differently: Take 1 g by mouth daily at 6 (six) AM. 1 tab in am, 1 tab in pm 08/28/21  Yes Patsy Lenis, MD  cyanocobalamin  (VITAMIN B12) 1000 MCG tablet Take 1,000 mcg by mouth daily. Patient not taking: Reported on 12/07/2023 07/12/22    [provider]  fluconazole  (DIFLUCAN ) 100 MG tablet Take 100 mg by mouth daily. Patient not taking: Reported on 12/07/2023 05/14/23   [provider]  hydrocortisone  (ANUSOL -HC) 25 MG suppository Place 25 mg rectally 2 (two) times daily as needed for hemorrhoids. Patient not taking: Reported on 12/07/2023 12/07/20   [provider]  Methen-Hyosc-Meth Blue-Na Phos (UROGESIC-BLUE) 81.6 MG TABS Take 1 tablet by mouth every 6 (six) hours as needed (urinary burning). 08/21/21   [provider]  rosuvastatin  (CRESTOR ) 5 MG tablet Take 1 tablet (5 mg total) by mouth 3 (three) times a  week. 08/03/22 05/27/23  Santo Stanly LABOR, MD  senna (SENOKOT) 8.6 MG tablet Take 1 tablet by mouth as needed for constipation. Patient not taking: Reported on 12/07/2023 07/28/21   [provider]  triamcinolone  cream (KENALOG ) 0.1 % Apply 1 application. topically 2 (two) times daily as needed (rash). Patient not taking: Reported on 12/07/2023 04/06/21   [provider]    Scheduled Meds:  calcium  carbonate  1 tablet Oral TID WC   ezetimibe   10 mg Oral Daily   gabapentin   300 mg Oral QHS   hydrALAZINE   50 mg Oral TID   isosorbide  dinitrate  10 mg Oral TID   metoprolol  tartrate  25 mg Oral BID   pantoprazole   40 mg Oral BID   [START ON 12/09/2023] rosuvastatin   5 mg Oral Once per day on Monday Wednesday Friday   sodium chloride  flush  3 mL Intravenous Q12H   sucralfate   1 g Oral TID WC & HS   Vitamin D  (Ergocalciferol )  50,000 Units Oral Q7 days   Continuous Infusions:  meropenem  (MERREM ) IV     PRN Meds: acetaminophen , dextromethorphan -guaiFENesin , HYDROcodone -acetaminophen , morphine  injection, ondansetron  **OR** ondansetron  (ZOFRAN ) IV, phenazopyridine   Allergies:    Allergies  Allergen Reactions   Moxifloxacin Hives, Shortness Of Breath, Swelling and Rash    Rash was severe, caused tremors, ans skin became BLOODY RED   Quinolones Anaphylaxis   Sulfa Antibiotics  Hives and Rash   Furosemide  Other (See Comments)    CANNOT TAKE DUE TO Interstitial cystitis   Aspirin  Nausea Only and Other (See Comments)    GI and stomach upset   Doxazosin      constipation   Duloxetine     Other reaction(s): Other (See Comments)   Duloxetine Hcl Nausea Only and Other (See Comments)    Vertigo and heart racing also    Hydrochlorothiazide Other (See Comments)    Headaches    Ibuprofen Nausea Only and Other (See Comments)    GI upset   Nebivolol  Hcl     Other reaction(s): extreme fatigue   Other Other (See Comments)   Ranexa  [Ranolazine ]     Constipation/heart pounding   Robaxin [Methocarbamol] Other (See Comments)    Make me feel flu-like symptoms   Topiramate Itching   Xarelto  [Rivaroxaban ]     Back pain   Zofran  [Ondansetron  Hcl] Other (See Comments)    Headaches    Codeine Hives, Nausea And Vomiting and Rash   Dimethyl Sulfoxide Hives and Itching   Macrodantin  [Nitrofurantoin  Macrocrystal] Itching, Nausea And Vomiting and Rash    Abdominal and chest pain, also- blood-red skin, also   Nitrofurantoin  Diarrhea, Itching, Rash and Other (See Comments)    GI/stomach upset, sweating, chest pain, and skin turned bloody red   Tape Rash   Yellow Dye #6 (Sunset Yellow) Rash    Social History:   Social History   Socioeconomic History   Marital status: Single    Spouse name: none   Number of children: 0   Years of education: College   Highest education level: Not on file  Occupational History   Occupation: n/a  Tobacco Use   Smoking status: Never   Smokeless tobacco: Never  Vaping Use   Vaping status: Never Used  Substance and Sexual Activity   Alcohol use: No    Alcohol/week: 0.0 standard drinks of alcohol   Drug use: No   Sexual activity: Not on file  Other Topics Concern   Not on file  Social History  Narrative   Lives alone   Alyse Luke Kerns, RN    Caffeine use: 2 glasses tea/day   Brother   Mother passed in ?58 at 70, father  passed in 40's in 2006   She was a Runner, broadcasting/film/video      Social Drivers of Corporate investment banker Strain: Low Risk  (03/22/2022)   Overall Financial Resource Strain (CARDIA)    Difficulty of Paying Living Expenses: Not very hard  Food Insecurity: No Food Insecurity (12/07/2023)   Hunger Vital Sign    Worried About Running Out of Food in the Last Year: Never true    Ran Out of Food in the Last Year: Never true  Transportation Needs: No Transportation Needs (12/07/2023)   PRAPARE - Administrator, Civil Service (Medical): No    Lack of Transportation (Non-Medical): No  Physical Activity: Not on file  Stress: No Stress Concern Present (03/22/2022)   [employer] of Occupational Health - Occupational Stress Questionnaire    Feeling of Stress : Only a little  Social Connections: Moderately Integrated (12/07/2023)   Social Connection and Isolation Panel    Frequency of Communication with Friends and Family: Twice a week    Frequency of Social Gatherings with Friends and Family: Twice a week    Attends Religious Services: 1 to 4 times per year    Active Member of [employer] or Organizations: No    Attends Engineer, structural: 1 to 4 times per year    Marital Status: Never married  Intimate Partner Violence: Not At Risk (12/07/2023)   Humiliation, Afraid, Rape, and Kick questionnaire    Fear of Current or Ex-Partner: No    Emotionally Abused: No    Physically Abused: No    Sexually Abused: No    Family History:    Family History  Problem Relation Age of Onset   CAD Father    Asthma Father    Hypertension Father    Alzheimer's disease Father    Emphysema Father    COPD Father    Heart failure Mother    Peripheral vascular disease Mother    Stroke Mother    Hypertension Mother    Colonic polyp Mother    Cancer Brother        melanoma     ROS:  Please see the history of present illness.   All other ROS reviewed and negative.     Physical Exam/Data: Vitals:    12/07/23 1735 12/07/23 2015 12/08/23 0500 12/08/23 0735  BP: (!) 122/20 (!) 150/64 (!) 156/59 (!) 172/68  Pulse: 65 60 (!) 57 (!) 51  Resp: 16 19  17   Temp: 98.8 F (37.1 C) 99.4 F (37.4 C) 98.3 F (36.8 C) 97.8 F (36.6 C)  TempSrc:  Oral Oral Oral  SpO2:  97% 99% 98%  Weight:      Height:        Intake/Output Summary (Last 24 hours) at 12/08/2023 1505 Last data filed at 12/08/2023 1100 Gross per 24 hour  Intake 1225 ml  Output 600 ml  Net 625 ml      12/07/2023    3:36 PM 05/30/2023    2:37 PM 05/27/2023    1:59 PM  Last 3 Weights  Weight (lbs) 212 lb 209 lb 210 lb  Weight (kg) 96.163 kg 94.802 kg 95.255 kg     Body mass index is 38.78 kg/m.  General:  Well nourished, well developed, in no acute distress HEENT:  Significant ecchymosis around both eyes. Neck: no JVD Vascular: No carotid bruits; Distal pulses 2+ bilaterally Cardiac:  normal S1, S2; RRR; no murmur  Lungs:  clear to auscultation bilaterally, no wheezing, rhonchi or rales  Abd: soft, nontender, no hepatomegaly  Ext: no edema Musculoskeletal: Unable to move right arm.   Skin: warm and dry  Neuro:  CNs 2-12 intact, no focal abnormalities noted Psych:  Normal affect   EKG:  The EKG was personally reviewed and demonstrates: Normal sinus rhythm, heart rate 86.  No significant ST-T wave changes. Telemetry:  Telemetry was personally reviewed and demonstrates: Normal sinus rhythm, no significant ventricular ectopy, occasional heart rate in the high 40s.  Relevant CV Studies:  Echo 02/14/2023 1. Left ventricular ejection fraction, by estimation, is 60 to 65%. The  left ventricle has normal function. The left ventricle has no regional  wall motion abnormalities. There is mild concentric left ventricular  hypertrophy. Left ventricular diastolic  parameters are consistent with Grade I diastolic dysfunction (impaired  relaxation). GLS -20.8%   2. Right ventricular systolic function is normal. The right  ventricular  size is normal. There is mildly elevated pulmonary artery systolic  pressure.   3. Left atrial size was mildly dilated.   4. The mitral valve is normal in structure. Mild mitral valve  regurgitation. No evidence of mitral stenosis.   5. The aortic valve is severely calcified with restricted excursion of  valve leaflets and flow acceleration across the valve. There is atleast  moderate aortic stenosis with a mean PG of and AVA by VTI of  0.89cm2. There is severe AS by 2D  planimetry.   6. Aortic dilatation noted. There is dilatation of the ascending aorta,  measuring 41 mm.   7. The inferior vena cava is normal in size with greater than 50%  respiratory variability, suggesting right atrial pressure of 3 mmHg.   Laboratory Data: High Sensitivity Troponin:  No results for input(s): TROPONINIHS in the last 720 hours.   Chemistry Recent Labs  Lab 12/06/23 2212 12/08/23 0405  NA 138 138  K 4.4 3.8  CL 101 104  CO2 24 26  GLUCOSE 121* 98  BUN 36* 21  CREATININE 1.50* 0.91  CALCIUM  8.7* 8.1*  GFRNONAA 36* >60  ANIONGAP 13 8    Recent Labs  Lab 12/06/23 2212 12/08/23 0405  PROT 6.6 5.4*  ALBUMIN  3.7 2.8*  AST 71* 34  ALT 37 27  ALKPHOS 52 42  BILITOT 1.6* 1.8*   Lipids No results for input(s): CHOL, TRIG, HDL, LABVLDL, LDLCALC, CHOLHDL in the last 168 hours.  Hematology Recent Labs  Lab 12/06/23 2212 12/07/23 0907 12/07/23 0953 12/08/23 0405  WBC 13.8* 9.6  --  9.4  RBC 2.79* 2.27*  --  2.87*  2.93*  HGB 8.2* 6.6* 7.1* 8.2*  HCT 25.8* 21.4* 22.9* 25.2*  MCV 92.5 94.3  --  87.8  MCH 29.4 29.1  --  28.6  MCHC 31.8 30.8  --  32.5  RDW 13.2 13.4  --  16.9*  PLT 256 203  --  198   Thyroid  No results for input(s): TSH, FREET4 in the last 168 hours.  BNPNo results for input(s): BNP, PROBNP in the last 168 hours.  DDimer No results for input(s): DDIMER in the last 168 hours.  Radiology/Studies:  CT SHOULDER RIGHT WO  CONTRAST Result Date: 12/07/2023 CLINICAL DATA:  Right shoulder injury after fall. EXAM: CT OF THE UPPER RIGHT EXTREMITY WITHOUT CONTRAST TECHNIQUE: Multidetector CT  imaging of the upper right extremity was performed according to the standard protocol. RADIATION DOSE REDUCTION: This exam was performed according to the departmental dose-optimization program which includes automated exposure control, adjustment of the mA and/or kV according to patient size and/or use of iterative reconstruction technique. COMPARISON:  December 06, 2023. FINDINGS: No dislocation is noted involving the glenohumeral joint. Visualized ribs, scapula and clavicle are unremarkable. There appears to be a severely displaced and comminuted fracture involving the proximal right humeral shaft, which extends proximally into the proximal right humeral head and neck. IMPRESSION: There appears to be a severely displaced and comminuted fracture involving the proximal right humeral shaft which extends proximally into the proximal right humeral head and neck. Electronically Signed   By: Lynwood Landy Raddle M.D.   On: 12/07/2023 09:49   CT CHEST ABDOMEN PELVIS W CONTRAST Result Date: 12/07/2023 CLINICAL DATA:  Polytrauma, blunt Fall, severe bruising, on blood thinners. Fall. Chest and right arm pain. EXAM: CT CHEST, ABDOMEN, AND PELVIS WITH CONTRAST TECHNIQUE: Multidetector CT imaging of the chest, abdomen and pelvis was performed following the standard protocol during bolus administration of intravenous contrast. RADIATION DOSE REDUCTION: This exam was performed according to the departmental dose-optimization program which includes automated exposure control, adjustment of the mA and/or kV according to patient size and/or use of iterative reconstruction technique. CONTRAST:  75mL OMNIPAQUE  IOHEXOL  350 MG/ML SOLN COMPARISON:  09/15/2020 FINDINGS: CT CHEST FINDINGS Cardiovascular: Heart is mildly enlarged. Scattered coronary artery and aortic  atherosclerosis. No evidence of aortic aneurysm. Mediastinum/Nodes: No mediastinal, hilar, or axillary adenopathy. Trachea and esophagus are unremarkable. Thyroid  unremarkable. Lungs/Pleura: No confluent opacities, effusions or pneumothorax. Musculoskeletal: Stranding within the subcutaneous soft tissues of the right breast, likely hematoma. Comminuted, displaced right humeral fracture noted as seen on earlier plain films. No additional acute bony abnormality. CT ABDOMEN PELVIS FINDINGS Hepatobiliary: No hepatic injury or perihepatic hematoma. Prior cholecystectomy. Pancreas: No focal abnormality or ductal dilatation. Spleen: No splenic injury or perisplenic hematoma. Adrenals/Urinary Tract: No adrenal hemorrhage or renal injury identified. Bladder is unremarkable. Stomach/Bowel: Normal appendix. Sigmoid diverticulosis. No active diverticulitis. Stomach and small bowel decompressed, unremarkable. Vascular/Lymphatic: Aortic atherosclerosis. No evidence of aneurysm or adenopathy. Reproductive: Prior hysterectomy.  No adnexal masses. Other: No free fluid or free air. Musculoskeletal: Leftward scoliosis in the lumbar spine with associated degenerative changes. No acute bony abnormality. IMPRESSION: Stranding within the subcutaneous soft tissues of the right breast/chest wall most likely reflecting hematoma. Otherwise no significant traumatic injury in the chest, abdomen or pelvis. Comminuted, displaced right humeral fracture as seen on earlier plain films. Cardiomegaly, coronary artery disease.  Aortic atherosclerosis. Sigmoid diverticulosis. Electronically Signed   By: Franky Crease M.D.   On: 12/07/2023 00:16   CT Cervical Spine Wo Contrast Result Date: 12/07/2023 CLINICAL DATA:  Fall, neck trauma EXAM: CT CERVICAL SPINE WITHOUT CONTRAST TECHNIQUE: Multidetector CT imaging of the cervical spine was performed without intravenous contrast. Multiplanar CT image reconstructions were also generated. RADIATION DOSE  REDUCTION: This exam was performed according to the departmental dose-optimization program which includes automated exposure control, adjustment of the mA and/or kV according to patient size and/or use of iterative reconstruction technique. COMPARISON:  None Available. FINDINGS: Alignment: No subluxation.  Loss of cervical lordosis. Skull base and vertebrae: No acute fracture. No primary bone lesion or focal pathologic process. Soft tissues and spinal canal: No prevertebral fluid or swelling. No visible canal hematoma. Disc levels: Moderate bilateral degenerative facet disease. Fusion across multiple facet joints, left greater than right.  Mild degenerative disc disease. Multilevel bilateral neural foraminal narrowing. Upper chest: No acute findings Other: None IMPRESSION: Degenerative disc and facet disease.  No acute bony abnormality. Electronically Signed   By: Franky Crease M.D.   On: 12/07/2023 00:12   CT Head Wo Contrast Result Date: 12/07/2023 CLINICAL DATA:  Fall, head trauma, on blood thinners. EXAM: CT HEAD WITHOUT CONTRAST TECHNIQUE: Contiguous axial images were obtained from the base of the skull through the vertex without intravenous contrast. RADIATION DOSE REDUCTION: This exam was performed according to the departmental dose-optimization program which includes automated exposure control, adjustment of the mA and/or kV according to patient size and/or use of iterative reconstruction technique. COMPARISON:  09/15/2020 FINDINGS: Brain: No acute intracranial abnormality. Specifically, no hemorrhage, hydrocephalus, mass lesion, acute infarction, or significant intracranial injury. There is atrophy and chronic small vessel disease changes. Vascular: No hyperdense vessel or unexpected calcification. Skull: No acute calvarial abnormality. Sinuses/Orbits: No acute findings Other: Soft tissue swelling in the forehead. IMPRESSION: Atrophy, chronic microvascular disease. No acute intracranial abnormality.  Electronically Signed   By: Franky Crease M.D.   On: 12/07/2023 00:10   DG Shoulder Right Result Date: 12/06/2023 CLINICAL DATA:  Clemens, right arm pain EXAM: RIGHT SHOULDER - 2+ VIEW; RIGHT ELBOW - 2 VIEW; RIGHT HUMERUS - 2+ VIEW COMPARISON:  None Available. FINDINGS: Right shoulder: Internal and external views of the right shoulder are obtained. There is a comminuted spiral fracture of the proximal right humeral diaphysis, with varus angulation at the fracture site. Glenohumeral and acromioclavicular joints remain in anatomic alignment. Diffuse soft tissue swelling. Right humerus: Frontal and lateral views demonstrate comminuted spiral proximal right humeral diaphyseal fracture, with varus angulation at the fracture site. Alignment of the right shoulder and elbow remains anatomic. Diffuse soft tissue swelling. Right elbow: Frontal and lateral views are obtained. There are no acute displaced fractures. Alignment is anatomic. Mild osteoarthritis. Diffuse subcutaneous edema. IMPRESSION: 1. Comminuted displaced spiral fracture of the proximal right humeral diaphysis, with varus angulation at the fracture site. 2. Diffuse soft tissue swelling throughout the right upper extremity. Electronically Signed   By: Ozell Daring M.D.   On: 12/06/2023 22:58   DG Humerus Right Result Date: 12/06/2023 CLINICAL DATA:  Clemens, right arm pain EXAM: RIGHT SHOULDER - 2+ VIEW; RIGHT ELBOW - 2 VIEW; RIGHT HUMERUS - 2+ VIEW COMPARISON:  None Available. FINDINGS: Right shoulder: Internal and external views of the right shoulder are obtained. There is a comminuted spiral fracture of the proximal right humeral diaphysis, with varus angulation at the fracture site. Glenohumeral and acromioclavicular joints remain in anatomic alignment. Diffuse soft tissue swelling. Right humerus: Frontal and lateral views demonstrate comminuted spiral proximal right humeral diaphyseal fracture, with varus angulation at the fracture site. Alignment of the  right shoulder and elbow remains anatomic. Diffuse soft tissue swelling. Right elbow: Frontal and lateral views are obtained. There are no acute displaced fractures. Alignment is anatomic. Mild osteoarthritis. Diffuse subcutaneous edema. IMPRESSION: 1. Comminuted displaced spiral fracture of the proximal right humeral diaphysis, with varus angulation at the fracture site. 2. Diffuse soft tissue swelling throughout the right upper extremity. Electronically Signed   By: Ozell Daring M.D.   On: 12/06/2023 22:58   DG Elbow 2 Views Right Result Date: 12/06/2023 CLINICAL DATA:  Clemens, right arm pain EXAM: RIGHT SHOULDER - 2+ VIEW; RIGHT ELBOW - 2 VIEW; RIGHT HUMERUS - 2+ VIEW COMPARISON:  None Available. FINDINGS: Right shoulder: Internal and external views of the right shoulder are obtained.  There is a comminuted spiral fracture of the proximal right humeral diaphysis, with varus angulation at the fracture site. Glenohumeral and acromioclavicular joints remain in anatomic alignment. Diffuse soft tissue swelling. Right humerus: Frontal and lateral views demonstrate comminuted spiral proximal right humeral diaphyseal fracture, with varus angulation at the fracture site. Alignment of the right shoulder and elbow remains anatomic. Diffuse soft tissue swelling. Right elbow: Frontal and lateral views are obtained. There are no acute displaced fractures. Alignment is anatomic. Mild osteoarthritis. Diffuse subcutaneous edema. IMPRESSION: 1. Comminuted displaced spiral fracture of the proximal right humeral diaphysis, with varus angulation at the fracture site. 2. Diffuse soft tissue swelling throughout the right upper extremity. Electronically Signed   By: Ozell Daring M.D.   On: 12/06/2023 22:58     Assessment and Plan: Preop clearance  - Upcoming right humeral fracture surgery tomorrow.  - Patient lead a very sedentary lifestyle. due to significant spinal stenosis and sciatica. - She has occasional few seconds of  chest discomfort that occurs about once a month and that does not correlate with exertion.  Previous cardiac catheterization in 2022 showed a single-vessel CAD with 90% diffusely diseased distal LAD treated medically due to risk for dissection and tortuosity. - Patient has no clear angina.  Pending echocardiogram to assess the degree of aortic stenosis. - She has shortness of breath climbing up 3 flight of stairs but no shortness of breath walking on flat ground.  Functional ability mainly limited by back issue.  I do not think her cardiac risk is prohibitive for her to undergo surgery.  She is likely at least a moderate risk patient going for a low risk surgery.  Right humeral fracture: Pending surgery tomorrow  Possible aortic stenosis: Pending echocardiogram.  Previously scheduled to see Dr. Wendel next week to discuss TAVR procedure  History of positive PYP scan for amyloidosis: Negative cardiac MRI in 2024.  She has significant polyneuropathy, cannot completely rule out possibility of cardiac amyloidosis, pending genetic testing  CAD: Single-vessel CAD with diffuse 90% distal LAD lesion treated medically given tortuosity of the vessel and risk for dissection due to the diffuse nature of the disease.  She occasionally has a few seconds of chest pain about once a month, there has been no exertional chest pain recently.  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  Required self catheterization at home.  Anemia: Treated with 2 units of packed red blood cell    Risk Assessment/Risk Scores:         CHA2DS2-VASc Score = 5   This indicates a 7.2% annual risk of stroke. The patient's score is based upon: CHF History: 0 HTN History: 1 Diabetes History: 0 Stroke History: 0 Vascular Disease History: 1 Age Score: 2 Gender Score: 1        For questions or updates, please contact [facility] Please consult  [email or web address] for contact info under    Bonney Scot Ford, [place]  12/08/2023 3:05 PM

## 2023-12-08 NOTE — Progress Notes (Signed)
 Transition of Care [facility]) - CAGE-AID Screening   Patient Details  Name: Jennifer [name] MRN: [id number] Date of Birth: [birth year]  Jennifer CHRISTELLA Rouleau, RN Trauma Response Nurse Phone Number: [phone] 12/08/2023, 3:27 PM    CAGE-AID Screening:    Have You Ever Felt You Ought to Cut Down on Your Drinking or Drug Use?: No Have People Annoyed You By Critizing Your Drinking Or Drug Use?: No Have You Felt Bad Or Guilty About Your Drinking Or Drug Use?: No Have You Ever Had a Drink or Used Drugs First Thing In The Morning to Steady Your Nerves or to Get Rid of a Hangover?: No CAGE-AID Score: 0  Substance Abuse Education Offered: (S) No (Pt reports no alcohol/drug use -- no services needed)

## 2023-12-08 NOTE — Progress Notes (Signed)
 [facility]                                                                               Jennifer [name], is a 78 y.o. female, DOB - 03-18-1946, FMW:[id number] Admit date - 12/06/2023    Outpatient Primary MD for the patient is Katina Pfeiffer, PA-C  LOS - 1  days    Brief summary    Jennifer [name] is a 78 y.o. 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  She was sitting in her recliner and per her interpretation she stood up too fast.  She felt dizzy and fell into a windowsill. She denies any syncope. She was found to have comminuted right humerus fracture.    CT shoulder shows severely displaced and comminuted fracture involving the proximal right humeral shaft which extends proximally into the proximal right humeral head and neck.    CT Chest abd and pelvis shows Stranding within the subcutaneous soft tissues of the right breast/chest wall most likely reflecting hematoma.  Assessment & Plan    Assessment and Plan:     Comminuted fracture involving the proximal right humerus shaft Pain control.  Orthopedics on board. Plan for surgical repair in am.    Vitamin D  deficiency Replaced.    Acute blood loss anemia in the setting of eliquis  use 2 units of prbc transfusion.  Repeat H&H is around 8.  Check hemoglobin in am , transfuse to keep it greater than 8.  Anemia panel wnl.    Atrial fibrillation Rate controlled on BB.    Chronic interstitial cystitis, in the setting of chronic ESBL  Ecoli, MDR klebsiella  UA IS abnormal, she reports some burning while placing a purewick.  She self caths .  She follows up with urology outpatient.    Severe AS HFpEF Holding all BP for now.     Hypertension Well controlled.     Hypothyroidism: Continue synthroid  Estimated body mass index is 38.78 kg/m as calculated from the following:   Height as of this encounter: 5' 2 (1.575 m).   Weight as of this encounter: 96.2 kg.  Code Status: full code.  DVT Prophylaxis:  SCDs Start: 12/07/23 1003   Level of Care: Level of care: Telemetry Medical Family Communication: none at bedside.  Disposition Plan:     Remains inpatient appropriate: pending.   Procedures:  Surgical repair of the right humerus fracture.   Consultants:   CARDIOLOGY Orthopedics.  Antimicrobials:   Anti-infectives (From admission, onward)    Start     Dose/Rate Route Frequency Ordered Stop   12/08/23 2200  meropenem  (MERREM ) 1 g in sodium chloride  0.9 % 100 mL IVPB        1 g 200 mL/hr over 30 Minutes Intravenous Every 8 hours 12/08/23 1337  12/07/23 1202  Urogesic-Blue 81.6 MG TABS 81.6 mg  Status:  Discontinued        1 tablet Oral Every 6 hours PRN 12/07/23 1203 12/07/23 1229   12/07/23 0645  meropenem  (MERREM ) 1 g in sodium chloride  0.9 % 100 mL IVPB  Status:  Discontinued        1 g 200 mL/hr over 30 Minutes Intravenous Every 12 hours 12/07/23 0635 12/08/23 1337        Medications  Scheduled Meds:  calcium  carbonate  1 tablet Oral TID WC   ezetimibe   10 mg Oral Daily   gabapentin   300 mg Oral QHS   hydrALAZINE   50 mg Oral TID   isosorbide  dinitrate  10 mg Oral TID   metoprolol  tartrate  25 mg Oral BID   pantoprazole   40 mg Oral BID   [START ON 12/09/2023] rosuvastatin   5 mg Oral Once per day on Monday Wednesday Friday   sodium chloride  flush  3 mL Intravenous Q12H   sucralfate   1 g Oral TID WC & HS   Vitamin D  (Ergocalciferol )  50,000 Units Oral Q7 days   Continuous  Infusions:  meropenem  (MERREM ) IV     PRN Meds:.acetaminophen , dextromethorphan -guaiFENesin , HYDROcodone -acetaminophen , morphine  injection, ondansetron  **OR** ondansetron  (ZOFRAN ) IV, phenazopyridine     Subjective:   Jennifer [name] was seen and examined today.  Pain suboptimal  Objective:   Vitals:   12/07/23 2015 12/08/23 0500 12/08/23 0735 12/08/23 1559  BP: (!) 150/64 (!) 156/59 (!) 172/68 (!) 135/53  Pulse: 60 (!) 57 (!) 51 60  Resp: 19  17 20   Temp: 99.4 F (37.4 C) 98.3 F (36.8 C) 97.8 F (36.6 C) 98.9 F (37.2 C)  TempSrc: Oral Oral Oral Oral  SpO2: 97% 99% 98% 98%  Weight:      Height:        Intake/Output Summary (Last 24 hours) at 12/08/2023 1725 Last data filed at 12/08/2023 1616 Gross per 24 hour  Intake 1585 ml  Output 900 ml  Net 685 ml   Filed Weights   12/07/23 1536  Weight: 96.2 kg     Exam General exam: ill appearing elderly woman, not in distress.  Respiratory system: Clear to auscultation. Respiratory effort normal. Cardiovascular system: S1 & S2 heard, RRR.  Gastrointestinal system: Abdomen is nondistended, soft and nontender.  Central nervous system: Alert and oriented.  Extremities:  right upper extremity in sling.  Skin: extensive bruising of the right arm, chest wall, racoon eyes. Psychiatry:anxious.     Data Reviewed:  I have personally reviewed following labs and imaging studies   CBC Lab Results  Component Value Date   WBC 9.4 12/08/2023   RBC 2.87 (L) 12/08/2023   RBC 2.93 (L) 12/08/2023   HGB 8.2 (L) 12/08/2023   HCT 25.2 (L) 12/08/2023   MCV 87.8 12/08/2023   MCH 28.6 12/08/2023   PLT 198 12/08/2023   MCHC 32.5 12/08/2023   RDW 16.9 (H) 12/08/2023   LYMPHSABS 1.0 12/06/2023   MONOABS 1.3 (H) 12/06/2023   EOSABS 0.0 12/06/2023   BASOSABS 0.0 12/06/2023     Last metabolic panel Lab Results  Component Value Date   NA 138 12/08/2023   K 3.8 12/08/2023   CL 104 12/08/2023   CO2 26 12/08/2023   BUN 21 12/08/2023    CREATININE 0.91 12/08/2023   GLUCOSE 98 12/08/2023   GFRNONAA >60 12/08/2023   GFRAA 57 (L) 02/15/2017   CALCIUM  8.1 (L) 12/08/2023   PHOS 4.1 01/10/2021  PROT 5.4 (L) 12/08/2023   ALBUMIN  2.8 (L) 12/08/2023   LABGLOB 2.8 08/07/2022   AGRATIO 1.3 08/07/2022   BILITOT 1.8 (H) 12/08/2023   ALKPHOS 42 12/08/2023   AST 34 12/08/2023   ALT 27 12/08/2023   ANIONGAP 8 12/08/2023    CBG (last 3)  No results for input(s): GLUCAP in the last 72 hours.    Coagulation Profile: Recent Labs  Lab 12/06/23 2212  INR 1.6*     Radiology Studies: CT SHOULDER RIGHT WO CONTRAST Result Date: 12/07/2023 CLINICAL DATA:  Right shoulder injury after fall. EXAM: CT OF THE UPPER RIGHT EXTREMITY WITHOUT CONTRAST TECHNIQUE: Multidetector CT imaging of the upper right extremity was performed according to the standard protocol. RADIATION DOSE REDUCTION: This exam was performed according to the departmental dose-optimization program which includes automated exposure control, adjustment of the mA and/or kV according to patient size and/or use of iterative reconstruction technique. COMPARISON:  December 06, 2023. FINDINGS: No dislocation is noted involving the glenohumeral joint. Visualized ribs, scapula and clavicle are unremarkable. There appears to be a severely displaced and comminuted fracture involving the proximal right humeral shaft, which extends proximally into the proximal right humeral head and neck. IMPRESSION: There appears to be a severely displaced and comminuted fracture involving the proximal right humeral shaft which extends proximally into the proximal right humeral head and neck. Electronically Signed   By: Lynwood Landy Raddle M.D.   On: 12/07/2023 09:49   CT CHEST ABDOMEN PELVIS W CONTRAST Result Date: 12/07/2023 CLINICAL DATA:  Polytrauma, blunt Fall, severe bruising, on blood thinners. Fall. Chest and right arm pain. EXAM: CT CHEST, ABDOMEN, AND PELVIS WITH CONTRAST TECHNIQUE: Multidetector CT  imaging of the chest, abdomen and pelvis was performed following the standard protocol during bolus administration of intravenous contrast. RADIATION DOSE REDUCTION: This exam was performed according to the departmental dose-optimization program which includes automated exposure control, adjustment of the mA and/or kV according to patient size and/or use of iterative reconstruction technique. CONTRAST:  75mL OMNIPAQUE  IOHEXOL  350 MG/ML SOLN COMPARISON:  09/15/2020 FINDINGS: CT CHEST FINDINGS Cardiovascular: Heart is mildly enlarged. Scattered coronary artery and aortic atherosclerosis. No evidence of aortic aneurysm. Mediastinum/Nodes: No mediastinal, hilar, or axillary adenopathy. Trachea and esophagus are unremarkable. Thyroid  unremarkable. Lungs/Pleura: No confluent opacities, effusions or pneumothorax. Musculoskeletal: Stranding within the subcutaneous soft tissues of the right breast, likely hematoma. Comminuted, displaced right humeral fracture noted as seen on earlier plain films. No additional acute bony abnormality. CT ABDOMEN PELVIS FINDINGS Hepatobiliary: No hepatic injury or perihepatic hematoma. Prior cholecystectomy. Pancreas: No focal abnormality or ductal dilatation. Spleen: No splenic injury or perisplenic hematoma. Adrenals/Urinary Tract: No adrenal hemorrhage or renal injury identified. Bladder is unremarkable. Stomach/Bowel: Normal appendix. Sigmoid diverticulosis. No active diverticulitis. Stomach and small bowel decompressed, unremarkable. Vascular/Lymphatic: Aortic atherosclerosis. No evidence of aneurysm or adenopathy. Reproductive: Prior hysterectomy.  No adnexal masses. Other: No free fluid or free air. Musculoskeletal: Leftward scoliosis in the lumbar spine with associated degenerative changes. No acute bony abnormality. IMPRESSION: Stranding within the subcutaneous soft tissues of the right breast/chest wall most likely reflecting hematoma. Otherwise no significant traumatic injury in the  chest, abdomen or pelvis. Comminuted, displaced right humeral fracture as seen on earlier plain films. Cardiomegaly, coronary artery disease.  Aortic atherosclerosis. Sigmoid diverticulosis. Electronically Signed   By: Franky Crease M.D.   On: 12/07/2023 00:16   CT Cervical Spine Wo Contrast Result Date: 12/07/2023 CLINICAL DATA:  Fall, neck trauma EXAM: CT CERVICAL SPINE WITHOUT CONTRAST TECHNIQUE:  Multidetector CT imaging of the cervical spine was performed without intravenous contrast. Multiplanar CT image reconstructions were also generated. RADIATION DOSE REDUCTION: This exam was performed according to the departmental dose-optimization program which includes automated exposure control, adjustment of the mA and/or kV according to patient size and/or use of iterative reconstruction technique. COMPARISON:  None Available. FINDINGS: Alignment: No subluxation.  Loss of cervical lordosis. Skull base and vertebrae: No acute fracture. No primary bone lesion or focal pathologic process. Soft tissues and spinal canal: No prevertebral fluid or swelling. No visible canal hematoma. Disc levels: Moderate bilateral degenerative facet disease. Fusion across multiple facet joints, left greater than right. Mild degenerative disc disease. Multilevel bilateral neural foraminal narrowing. Upper chest: No acute findings Other: None IMPRESSION: Degenerative disc and facet disease.  No acute bony abnormality. Electronically Signed   By: Franky Crease M.D.   On: 12/07/2023 00:12   CT Head Wo Contrast Result Date: 12/07/2023 CLINICAL DATA:  Fall, head trauma, on blood thinners. EXAM: CT HEAD WITHOUT CONTRAST TECHNIQUE: Contiguous axial images were obtained from the base of the skull through the vertex without intravenous contrast. RADIATION DOSE REDUCTION: This exam was performed according to the departmental dose-optimization program which includes automated exposure control, adjustment of the mA and/or kV according to patient size  and/or use of iterative reconstruction technique. COMPARISON:  09/15/2020 FINDINGS: Brain: No acute intracranial abnormality. Specifically, no hemorrhage, hydrocephalus, mass lesion, acute infarction, or significant intracranial injury. There is atrophy and chronic small vessel disease changes. Vascular: No hyperdense vessel or unexpected calcification. Skull: No acute calvarial abnormality. Sinuses/Orbits: No acute findings Other: Soft tissue swelling in the forehead. IMPRESSION: Atrophy, chronic microvascular disease. No acute intracranial abnormality. Electronically Signed   By: Franky Crease M.D.   On: 12/07/2023 00:10   DG Shoulder Right Result Date: 12/06/2023 CLINICAL DATA:  Clemens, right arm pain EXAM: RIGHT SHOULDER - 2+ VIEW; RIGHT ELBOW - 2 VIEW; RIGHT HUMERUS - 2+ VIEW COMPARISON:  None Available. FINDINGS: Right shoulder: Internal and external views of the right shoulder are obtained. There is a comminuted spiral fracture of the proximal right humeral diaphysis, with varus angulation at the fracture site. Glenohumeral and acromioclavicular joints remain in anatomic alignment. Diffuse soft tissue swelling. Right humerus: Frontal and lateral views demonstrate comminuted spiral proximal right humeral diaphyseal fracture, with varus angulation at the fracture site. Alignment of the right shoulder and elbow remains anatomic. Diffuse soft tissue swelling. Right elbow: Frontal and lateral views are obtained. There are no acute displaced fractures. Alignment is anatomic. Mild osteoarthritis. Diffuse subcutaneous edema. IMPRESSION: 1. Comminuted displaced spiral fracture of the proximal right humeral diaphysis, with varus angulation at the fracture site. 2. Diffuse soft tissue swelling throughout the right upper extremity. Electronically Signed   By: Ozell Daring M.D.   On: 12/06/2023 22:58   DG Humerus Right Result Date: 12/06/2023 CLINICAL DATA:  Clemens, right arm pain EXAM: RIGHT SHOULDER - 2+ VIEW; RIGHT  ELBOW - 2 VIEW; RIGHT HUMERUS - 2+ VIEW COMPARISON:  None Available. FINDINGS: Right shoulder: Internal and external views of the right shoulder are obtained. There is a comminuted spiral fracture of the proximal right humeral diaphysis, with varus angulation at the fracture site. Glenohumeral and acromioclavicular joints remain in anatomic alignment. Diffuse soft tissue swelling. Right humerus: Frontal and lateral views demonstrate comminuted spiral proximal right humeral diaphyseal fracture, with varus angulation at the fracture site. Alignment of the right shoulder and elbow remains anatomic. Diffuse soft tissue swelling. Right elbow: Frontal and  lateral views are obtained. There are no acute displaced fractures. Alignment is anatomic. Mild osteoarthritis. Diffuse subcutaneous edema. IMPRESSION: 1. Comminuted displaced spiral fracture of the proximal right humeral diaphysis, with varus angulation at the fracture site. 2. Diffuse soft tissue swelling throughout the right upper extremity. Electronically Signed   By: Ozell Daring M.D.   On: 12/06/2023 22:58   DG Elbow 2 Views Right Result Date: 12/06/2023 CLINICAL DATA:  Clemens, right arm pain EXAM: RIGHT SHOULDER - 2+ VIEW; RIGHT ELBOW - 2 VIEW; RIGHT HUMERUS - 2+ VIEW COMPARISON:  None Available. FINDINGS: Right shoulder: Internal and external views of the right shoulder are obtained. There is a comminuted spiral fracture of the proximal right humeral diaphysis, with varus angulation at the fracture site. Glenohumeral and acromioclavicular joints remain in anatomic alignment. Diffuse soft tissue swelling. Right humerus: Frontal and lateral views demonstrate comminuted spiral proximal right humeral diaphyseal fracture, with varus angulation at the fracture site. Alignment of the right shoulder and elbow remains anatomic. Diffuse soft tissue swelling. Right elbow: Frontal and lateral views are obtained. There are no acute displaced fractures. Alignment is  anatomic. Mild osteoarthritis. Diffuse subcutaneous edema. IMPRESSION: 1. Comminuted displaced spiral fracture of the proximal right humeral diaphysis, with varus angulation at the fracture site. 2. Diffuse soft tissue swelling throughout the right upper extremity. Electronically Signed   By: Ozell Daring M.D.   On: 12/06/2023 22:58       Elgie Butter M.D. [facility] 12/08/2023, 5:25 PM  Available via Epic secure chat 7am-7pm After 7 pm, please refer to night coverage provider listed on amion.

## 2023-12-08 NOTE — Progress Notes (Signed)
 OT Cancellation Note  Patient Details Name: Jennifer [name] MRN: [id number] DOB: [birth year]   Cancelled Treatment:    Reason Eval/Treat Not Completed: Other (comment) (pt with plan to return to OR on 9/8 for surgical intervention for R humerus fx. Will follow up for OT eval postop.)  Anaika Santillano K, OTD, OTR/L SecureChat [facility] [phone]   Laneta MARLA Pereyra 12/08/2023, 7:07 AM

## 2023-12-09 ENCOUNTER — Inpatient Hospital Stay (HOSPITAL_COMMUNITY): Admitting: Anesthesiology

## 2023-12-09 ENCOUNTER — Inpatient Hospital Stay (HOSPITAL_COMMUNITY)

## 2023-12-09 ENCOUNTER — Encounter (HOSPITAL_COMMUNITY)
Admission: EM | Disposition: A | Discharge status: Skilled Nursing Facility | Payer: Self-pay | Source: Home / Self Care | Attending: Internal Medicine

## 2023-12-09 ENCOUNTER — Other Ambulatory Visit: Payer: Self-pay

## 2023-12-09 DIAGNOSIS — I110 Hypertensive heart disease with heart failure: Secondary | ICD-10-CM

## 2023-12-09 DIAGNOSIS — I350 Nonrheumatic aortic (valve) stenosis: Secondary | ICD-10-CM | POA: Diagnosis not present

## 2023-12-09 DIAGNOSIS — I5032 Chronic diastolic (congestive) heart failure: Secondary | ICD-10-CM

## 2023-12-09 DIAGNOSIS — S42351A Displaced comminuted fracture of shaft of humerus, right arm, initial encounter for closed fracture: Secondary | ICD-10-CM | POA: Diagnosis not present

## 2023-12-09 DIAGNOSIS — N179 Acute kidney failure, unspecified: Secondary | ICD-10-CM | POA: Diagnosis not present

## 2023-12-09 DIAGNOSIS — D649 Anemia, unspecified: Secondary | ICD-10-CM | POA: Diagnosis not present

## 2023-12-09 DIAGNOSIS — I2510 Atherosclerotic heart disease of native coronary artery without angina pectoris: Secondary | ICD-10-CM | POA: Diagnosis not present

## 2023-12-09 DIAGNOSIS — S42201D Unspecified fracture of upper end of right humerus, subsequent encounter for fracture with routine healing: Secondary | ICD-10-CM | POA: Diagnosis not present

## 2023-12-09 DIAGNOSIS — S42212A Unspecified displaced fracture of surgical neck of left humerus, initial encounter for closed fracture: Secondary | ICD-10-CM | POA: Diagnosis not present

## 2023-12-09 DIAGNOSIS — S42301A Unspecified fracture of shaft of humerus, right arm, initial encounter for closed fracture: Secondary | ICD-10-CM

## 2023-12-09 HISTORY — PX: ORIF HUMERUS FRACTURE: SHX2126

## 2023-12-09 LAB — CBC WITH DIFFERENTIAL/PLATELET
Abs Immature Granulocytes: 0.15 K/uL — ABNORMAL HIGH (ref 0.00–0.07)
Basophils Absolute: 0 K/uL (ref 0.0–0.1)
Basophils Relative: 0 %
Eosinophils Absolute: 0 K/uL (ref 0.0–0.5)
Eosinophils Relative: 0 %
HCT: 31.3 % — ABNORMAL LOW (ref 36.0–46.0)
Hemoglobin: 10.1 g/dL — ABNORMAL LOW (ref 12.0–15.0)
Immature Granulocytes: 2 %
Lymphocytes Relative: 6 %
Lymphs Abs: 0.6 K/uL — ABNORMAL LOW (ref 0.7–4.0)
MCH: 28.6 pg (ref 26.0–34.0)
MCHC: 32.3 g/dL (ref 30.0–36.0)
MCV: 88.7 fL (ref 80.0–100.0)
Monocytes Absolute: 0.3 K/uL (ref 0.1–1.0)
Monocytes Relative: 3 %
Neutro Abs: 9 K/uL — ABNORMAL HIGH (ref 1.7–7.7)
Neutrophils Relative %: 89 %
Platelets: 207 K/uL (ref 150–400)
RBC: 3.53 MIL/uL — ABNORMAL LOW (ref 3.87–5.11)
RDW: 15.8 % — ABNORMAL HIGH (ref 11.5–15.5)
WBC: 10.2 K/uL (ref 4.0–10.5)
nRBC: 0 % (ref 0.0–0.2)

## 2023-12-09 LAB — ECHOCARDIOGRAM COMPLETE
AR max vel: 1.17 cm2
AV Area VTI: 1.17 cm2
AV Area mean vel: 1.2 cm2
AV Mean grad: 22 mmHg
AV Peak grad: 34.8 mmHg
Ao pk vel: 2.95 m/s
Area-P 1/2: 3.11 cm2
Height: 62.5 in
S' Lateral: 2.8 cm
Weight: 3392 [oz_av]

## 2023-12-09 LAB — URINE CULTURE: Culture: >=100000 — AB

## 2023-12-09 LAB — CALCIUM, IONIZED: Calcium, Ionized, Serum: 4.9 mg/dL (ref 4.5–5.6)

## 2023-12-09 LAB — PREPARE RBC (CROSSMATCH)

## 2023-12-09 SURGERY — OPEN REDUCTION INTERNAL FIXATION (ORIF) HUMERAL SHAFT FRACTURE
Anesthesia: General | Laterality: Right

## 2023-12-09 MED ORDER — SODIUM CHLORIDE 0.9 % IV SOLN: INTRAVENOUS | Status: DC | PRN

## 2023-12-09 MED ORDER — ONDANSETRON HCL 4 MG/2ML IJ SOLN
INTRAMUSCULAR | Status: DC | PRN
  Administered 2023-12-09: 4 mg via INTRAVENOUS

## 2023-12-09 MED ORDER — FENTANYL CITRATE (PF) 250 MCG/5ML IJ SOLN
INTRAMUSCULAR | Status: AC
  Filled 2023-12-09: qty 5

## 2023-12-09 MED ORDER — LIDOCAINE 2% (20 MG/ML) 5 ML SYRINGE
INTRAMUSCULAR | Status: DC | PRN
  Administered 2023-12-09: 60 mg via INTRAVENOUS
  Administered 2023-12-09: 40 mg via INTRAVENOUS

## 2023-12-09 MED ORDER — PROPOFOL 10 MG/ML IV BOLUS
INTRAVENOUS | Status: AC
Start: 2023-12-09 — End: 2023-12-09
  Filled 2023-12-09: qty 20

## 2023-12-09 MED ORDER — PHENOL 1.4 % MT LIQD: 1.0000 | OROMUCOSAL | Status: DC | PRN

## 2023-12-09 MED ORDER — ACETAMINOPHEN 10 MG/ML IV SOLN: 1000.0000 mg | Freq: Once | INTRAVENOUS | Status: DC | PRN

## 2023-12-09 MED ORDER — DOCUSATE SODIUM 100 MG PO CAPS
100.0000 mg | ORAL_CAPSULE | Freq: Two times a day (BID) | ORAL | Status: DC
  Administered 2023-12-10 – 2023-12-13 (×5): 100 mg via ORAL
  Filled 2023-12-09 (×8): qty 1

## 2023-12-09 MED ORDER — SCOPOLAMINE 1 MG/3DAYS TD PT72
1.0000 | MEDICATED_PATCH | TRANSDERMAL | Status: DC
  Administered 2023-12-12: 1 mg via TRANSDERMAL
  Filled 2023-12-09: qty 1

## 2023-12-09 MED ORDER — CEPHALEXIN 500 MG PO CAPS
500.0000 mg | ORAL_CAPSULE | Freq: Three times a day (TID) | ORAL | Status: DC
  Administered 2023-12-09 – 2023-12-13 (×13): 500 mg via ORAL
  Filled 2023-12-09 (×13): qty 1

## 2023-12-09 MED ORDER — MENTHOL 3 MG MT LOZG: 1.0000 | LOZENGE | OROMUCOSAL | Status: DC | PRN

## 2023-12-09 MED ORDER — CHLORHEXIDINE GLUCONATE 0.12 % MT SOLN
15.0000 mL | Freq: Once | OROMUCOSAL | Status: AC
  Administered 2023-12-09: 15 mL via OROMUCOSAL
  Filled 2023-12-09: qty 15

## 2023-12-09 MED ORDER — SODIUM CHLORIDE 0.9 % IV SOLN: 10.0000 mL/h | Freq: Once | INTRAVENOUS | Status: DC

## 2023-12-09 MED ORDER — ORAL CARE MOUTH RINSE: 15.0000 mL | Freq: Once | OROMUCOSAL | Status: AC

## 2023-12-09 MED ORDER — HYDROCODONE-ACETAMINOPHEN 7.5-325 MG PO TABS
1.0000 | ORAL_TABLET | Freq: Four times a day (QID) | ORAL | Status: DC | PRN
  Administered 2023-12-09 – 2023-12-13 (×10): 1 via ORAL
  Filled 2023-12-09 (×11): qty 1

## 2023-12-09 MED ORDER — PHENYLEPHRINE 80 MCG/ML (10ML) SYRINGE FOR IV PUSH (FOR BLOOD PRESSURE SUPPORT)
PREFILLED_SYRINGE | INTRAVENOUS | Status: DC | PRN
  Administered 2023-12-09 (×2): 80 ug via INTRAVENOUS

## 2023-12-09 MED ORDER — OXYCODONE HCL 5 MG/5ML PO SOLN: 5.0000 mg | Freq: Once | ORAL | Status: DC | PRN

## 2023-12-09 MED ORDER — HYDRALAZINE HCL 20 MG/ML IJ SOLN
10.0000 mg | Freq: Once | INTRAMUSCULAR | Status: AC
Start: 2023-12-09 — End: 2023-12-09
  Administered 2023-12-09: 10 mg via INTRAVENOUS

## 2023-12-09 MED ORDER — PROPOFOL 500 MG/50ML IV EMUL
INTRAVENOUS | Status: DC | PRN
  Administered 2023-12-09: 100 ug/kg/min via INTRAVENOUS
  Administered 2023-12-09: 75 ug/kg/min via INTRAVENOUS

## 2023-12-09 MED ORDER — LACTATED RINGERS IV SOLN: INTRAVENOUS | Status: DC

## 2023-12-09 MED ORDER — ROCURONIUM BROMIDE 10 MG/ML (PF) SYRINGE
PREFILLED_SYRINGE | INTRAVENOUS | Status: DC | PRN
  Administered 2023-12-09: 20 mg via INTRAVENOUS
  Administered 2023-12-09 (×2): 30 mg via INTRAVENOUS
  Administered 2023-12-09: 80 mg via INTRAVENOUS

## 2023-12-09 MED ORDER — FENTANYL CITRATE (PF) 100 MCG/2ML IJ SOLN
25.0000 ug | INTRAMUSCULAR | Status: DC | PRN
  Administered 2023-12-09: 50 ug via INTRAVENOUS
  Administered 2023-12-09 (×2): 25 ug via INTRAVENOUS

## 2023-12-09 MED ORDER — PROPOFOL 10 MG/ML IV BOLUS
INTRAVENOUS | Status: AC
  Filled 2023-12-09: qty 20

## 2023-12-09 MED ORDER — TRANEXAMIC ACID-NACL 1000-0.7 MG/100ML-% IV SOLN
INTRAVENOUS | Status: DC | PRN
Start: 2023-12-09 — End: 2023-12-09
  Administered 2023-12-09: 1000 mg via INTRAVENOUS

## 2023-12-09 MED ORDER — SUGAMMADEX SODIUM 200 MG/2ML IV SOLN
INTRAVENOUS | Status: DC | PRN
  Administered 2023-12-09: 200 mg via INTRAVENOUS

## 2023-12-09 MED ORDER — PHENYLEPHRINE HCL-NACL 20-0.9 MG/250ML-% IV SOLN
INTRAVENOUS | Status: DC | PRN
  Administered 2023-12-09: 25 ug/min via INTRAVENOUS

## 2023-12-09 MED ORDER — PERFLUTREN LIPID MICROSPHERE
1.0000 mL | INTRAVENOUS | Status: AC | PRN
  Administered 2023-12-09: 2 mL via INTRAVENOUS

## 2023-12-09 MED ORDER — BUPIVACAINE HCL (PF) 0.25 % IJ SOLN
INTRAMUSCULAR | Status: AC
  Filled 2023-12-09: qty 30

## 2023-12-09 MED ORDER — FENTANYL CITRATE (PF) 250 MCG/5ML IJ SOLN
INTRAMUSCULAR | Status: DC | PRN
  Administered 2023-12-09 (×3): 50 ug via INTRAVENOUS
  Administered 2023-12-09: 100 ug via INTRAVENOUS

## 2023-12-09 MED ORDER — FENTANYL CITRATE (PF) 100 MCG/2ML IJ SOLN
INTRAMUSCULAR | Status: AC
  Filled 2023-12-09: qty 2

## 2023-12-09 MED ORDER — ONDANSETRON HCL 4 MG/2ML IJ SOLN: 4.0000 mg | Freq: Once | INTRAMUSCULAR | Status: DC | PRN

## 2023-12-09 MED ORDER — MORPHINE SULFATE (PF) 2 MG/ML IV SOLN
2.0000 mg | INTRAVENOUS | Status: DC | PRN
  Administered 2023-12-09 – 2023-12-12 (×7): 2 mg via INTRAVENOUS
  Filled 2023-12-09 (×7): qty 1

## 2023-12-09 MED ORDER — DEXAMETHASONE SODIUM PHOSPHATE 10 MG/ML IJ SOLN
INTRAMUSCULAR | Status: DC | PRN
  Administered 2023-12-09: 10 mg via INTRAVENOUS

## 2023-12-09 MED ORDER — HYDRALAZINE HCL 20 MG/ML IJ SOLN
INTRAMUSCULAR | Status: AC
  Filled 2023-12-09: qty 1

## 2023-12-09 MED ORDER — SCOPOLAMINE 1 MG/3DAYS TD PT72
MEDICATED_PATCH | TRANSDERMAL | Status: AC
  Administered 2023-12-09: 1 mg via TRANSDERMAL
  Filled 2023-12-09: qty 1

## 2023-12-09 MED ORDER — OXYCODONE HCL 5 MG PO TABS: 5.0000 mg | ORAL_TABLET | Freq: Once | ORAL | Status: DC | PRN

## 2023-12-09 MED ORDER — ALBUMIN HUMAN 5 % IV SOLN: INTRAVENOUS | Status: DC | PRN

## 2023-12-09 MED ORDER — EPHEDRINE SULFATE-NACL 50-0.9 MG/10ML-% IV SOSY
PREFILLED_SYRINGE | INTRAVENOUS | Status: DC | PRN
  Administered 2023-12-09: 2.5 mg via INTRAVENOUS
  Administered 2023-12-09: 5 mg via INTRAVENOUS
  Administered 2023-12-09: 2.5 mg via INTRAVENOUS

## 2023-12-09 MED ORDER — PROPOFOL 10 MG/ML IV BOLUS
INTRAVENOUS | Status: DC | PRN
  Administered 2023-12-09: 120 mg via INTRAVENOUS

## 2023-12-09 SURGICAL SUPPLY — 70 items
BAG COUNTER SPONGE SURGICOUNT (BAG) ×1 IMPLANT
BENZOIN TINCTURE PRP APPL 2/3 (GAUZE/BANDAGES/DRESSINGS) ×2 IMPLANT
BIT DRILL 3.2XCALB NS DISP (BIT) IMPLANT
BIT DRILL CALIBRATED 2.7 (BIT) IMPLANT
BIT DRILL MID 1.8 ZI (BIT) IMPLANT
BIT OVERDRILL 2.4 ZI (BIT) IMPLANT
BNDG COMPR ESMARK 4X3 LF (GAUZE/BANDAGES/DRESSINGS) ×1 IMPLANT
BNDG GAUZE DERMACEA FLUFF 4 (GAUZE/BANDAGES/DRESSINGS) ×2 IMPLANT
BRUSH SCRUB EZ PLAIN DRY (MISCELLANEOUS) ×2 IMPLANT
CANISTER PREVENA PLUS 150 (CANNISTER) IMPLANT
CORD BIPOLAR FORCEPS 12FT (ELECTRODE) ×1 IMPLANT
COVER SURGICAL LIGHT HANDLE (MISCELLANEOUS) ×2 IMPLANT
DRAPE C-ARM 42X72 X-RAY (DRAPES) ×1 IMPLANT
DRAPE SURG ORHT 6 SPLT 77X108 (DRAPES) ×2 IMPLANT
DRAPE U-SHAPE 47X51 STRL (DRAPES) ×2 IMPLANT
DRESSING PREVENA PLUS CUSTOM (GAUZE/BANDAGES/DRESSINGS) IMPLANT
DRIVER RETENTION T8 ZI (ORTHOPEDIC DISPOSABLE SUPPLIES) IMPLANT
DRSG ADAPTIC 3X8 NADH LF (GAUZE/BANDAGES/DRESSINGS) ×1 IMPLANT
ELECTRODE REM PT RTRN 9FT ADLT (ELECTROSURGICAL) ×1 IMPLANT
EVACUATOR 1/8 PVC DRAIN (DRAIN) IMPLANT
GAUZE PAD ABD 8X10 STRL (GAUZE/BANDAGES/DRESSINGS) ×1 IMPLANT
GAUZE SPONGE 4X4 12PLY STRL (GAUZE/BANDAGES/DRESSINGS) ×2 IMPLANT
GLOVE BIO SURGEON STRL SZ7.5 (GLOVE) ×1 IMPLANT
GLOVE BIO SURGEON STRL SZ8 (GLOVE) ×1 IMPLANT
GLOVE BIOGEL PI IND STRL 7.5 (GLOVE) ×1 IMPLANT
GLOVE BIOGEL PI IND STRL 8 (GLOVE) ×1 IMPLANT
GLOVE SURG ORTHO LTX SZ7.5 (GLOVE) ×2 IMPLANT
GOWN STRL REUS W/ TWL LRG LVL3 (GOWN DISPOSABLE) ×2 IMPLANT
GOWN STRL REUS W/ TWL XL LVL3 (GOWN DISPOSABLE) ×1 IMPLANT
KIT BASIN OR (CUSTOM PROCEDURE TRAY) ×1 IMPLANT
KIT TURNOVER KIT B (KITS) ×1 IMPLANT
KWIRE 2X5 SS THRDED S3 (WIRE) IMPLANT
KWIRE ALPS MXV 1.6X6 ZI (WIRE) IMPLANT
MANIFOLD NEPTUNE II (INSTRUMENTS) ×1 IMPLANT
NDL HYPO 25X1 1.5 SAFETY (NEEDLE) ×1 IMPLANT
NEEDLE HYPO 25X1 1.5 SAFETY (NEEDLE) ×1 IMPLANT
NS IRRIG 1000ML POUR BTL (IV SOLUTION) ×1 IMPLANT
PACK ORTHO EXTREMITY (CUSTOM PROCEDURE TRAY) ×1 IMPLANT
PAD ARMBOARD POSITIONER FOAM (MISCELLANEOUS) ×2 IMPLANT
PEG LOCKING 3.2MMX26MM (Peg) IMPLANT
PEG LOCKING 3.2X 22MM (Peg) IMPLANT
PEG LOCKING 3.2X34 (Screw) IMPLANT
PEG LOCKING 3.2X40 (Peg) IMPLANT
PLATE PHP 11 H LOW RT (Plate) IMPLANT
PLATE STR 2.4 6H (Plate) IMPLANT
SCREW LOCK CORT STAR 3.5X50 (Screw) IMPLANT
SCREW LP NL T15 3.5X22 (Screw) IMPLANT
SCREW NLOCK 2.4X20 (Screw) IMPLANT
SCREW NLOCK 2.4X21 (Screw) IMPLANT
SCREW NLOCK 2.4X26 (Screw) IMPLANT
SCREW NLOCK 2.4X30 (Screw) IMPLANT
SCREW NLOCK MDS 2.4X22 (Screw) IMPLANT
SCREW T15 LP CORT 3.5X48MM NS (Screw) IMPLANT
SCREW T15 MD 3.5X22MM NS (Screw) IMPLANT
SPONGE T-LAP 18X18 ~~LOC~~+RFID (SPONGE) ×1 IMPLANT
STAPLER SKIN PROX 35W (STAPLE) ×1 IMPLANT
SUCTION TUBE FRAZIER 10FR DISP (SUCTIONS) ×1 IMPLANT
SUT ETHILON 2 0 FS 18 (SUTURE) ×2 IMPLANT
SUT PDS AB 2-0 CT1 27 (SUTURE) IMPLANT
SUT VIC AB 0 CT1 27XBRD ANBCTR (SUTURE) ×2 IMPLANT
SUT VIC AB 1 CT1 27XBRD ANBCTR (SUTURE) IMPLANT
SUT VIC AB 2-0 CT1 TAPERPNT 27 (SUTURE) ×2 IMPLANT
SYR 5ML LL (SYRINGE) IMPLANT
SYR CONTROL 10ML LL (SYRINGE) ×1 IMPLANT
TOWEL GREEN STERILE (TOWEL DISPOSABLE) ×3 IMPLANT
TOWEL GREEN STERILE FF (TOWEL DISPOSABLE) ×1 IMPLANT
TRAY FOLEY MTR SLVR 16FR STAT (SET/KITS/TRAYS/PACK) IMPLANT
TUBE CONNECTING 12X1/4 (SUCTIONS) ×1 IMPLANT
WATER STERILE IRR 1000ML POUR (IV SOLUTION) ×1 IMPLANT
YANKAUER SUCT BULB TIP NO VENT (SUCTIONS) IMPLANT

## 2023-12-09 NOTE — Plan of Care (Signed)

## 2023-12-09 NOTE — Anesthesia Preprocedure Evaluation (Addendum)
 Anesthesia Evaluation  Patient identified by MRN, date of birth, ID band Patient awake    Reviewed: Allergy & Precautions, NPO status , Patient's Chart, lab work & pertinent test results, reviewed documented beta blocker date and time   History of Anesthesia Complications (+) PONV and history of anesthetic complications  Airway Mallampati: III  TM Distance: >3 FB Neck ROM: Limited    Dental  (+) Dental Advisory Given, Missing,    Pulmonary sleep apnea , neg COPD   breath sounds clear to auscultation       Cardiovascular hypertension, + CAD, +CHF and + DOE  + dysrhythmias (on eliquis ) Atrial Fibrillation + Valvular Problems/Murmurs AS  Rhythm:Regular Rate:Normal + Systolic murmurs IMPRESSIONS     1. Left ventricular ejection fraction, by estimation, is 60 to 65%. The  left ventricle has normal function. The left ventricle has no regional  wall motion abnormalities. There is mild concentric left ventricular  hypertrophy. Left ventricular diastolic  parameters are consistent with Grade I diastolic dysfunction (impaired  relaxation). GLS -20.8%   2. Right ventricular systolic function is normal. The right ventricular  size is normal. There is mildly elevated pulmonary artery systolic  pressure.   3. Left atrial size was mildly dilated.   4. The mitral valve is normal in structure. Mild mitral valve  regurgitation. No evidence of mitral stenosis.   5. The aortic valve is severely calcified with restricted excursion of  valve leaflets and flow acceleration across the valve. There is atleast  moderate aortic stenosis with a mean PG of and AVA by VTI of  0.89cm2. There is severe AS by 2D  planimetry.   6. Aortic dilatation noted. There is dilatation of the ascending aorta,  measuring 41 mm.   7. The inferior vena cava is normal in size with greater than 50%  respiratory variability, suggesting right atrial pressure of 3  mmHg.     Neuro/Psych  Headaches, neg Seizures PSYCHIATRIC DISORDERS Anxiety      Neuromuscular disease    GI/Hepatic hiatal hernia,GERD  ,,  Endo/Other  Hypothyroidism    Renal/GU      Musculoskeletal  (+) Arthritis ,  Fibromyalgia -  Abdominal   Peds  Hematology  (+) Blood dyscrasia, anemia   Anesthesia Other Findings   Reproductive/Obstetrics                              Anesthesia Physical Anesthesia Plan  ASA: 3  Anesthesia Plan: General   Post-op Pain Management:    Induction: Intravenous  PONV Risk Score and Plan: 2 and Ondansetron  and Dexamethasone   Airway Management Planned: Oral ETT  Additional Equipment: Arterial line  Intra-op Plan:   Post-operative Plan: Extubation in OR  Informed Consent: I have reviewed the patients History and Physical, chart, labs and discussed the procedure including the risks, benefits and alternatives for the proposed anesthesia with the patient or authorized representative who has indicated his/her understanding and acceptance.     Dental advisory given  Plan Discussed with: CRNA  Anesthesia Plan Comments:          Anesthesia Quick Evaluation

## 2023-12-09 NOTE — Anesthesia Procedure Notes (Signed)
 Procedure Name: Intubation Date/Time: 12/09/2023 8:39 AM  Performed by: Vertie Arthea RAMAN, CRNAPre-anesthesia Checklist: Patient identified, Emergency Drugs available, Suction available and Patient being monitored Patient Re-evaluated:Patient Re-evaluated prior to induction Oxygen Delivery Method: Circle System Utilized Preoxygenation: Pre-oxygenation with 100% oxygen Induction Type: IV induction Ventilation: Mask ventilation without difficulty and Oral airway inserted - appropriate to patient size Laryngoscope Size: Glidescope and 3 Grade View: Grade I Tube type: Oral Tube size: 7.0 mm Number of attempts: 1 Airway Equipment and Method: Stylet and Oral airway Placement Confirmation: ETT inserted through vocal cords under direct vision, positive ETCO2 and breath sounds checked- equal and bilateral Tube secured with: Tape Dental Injury: Teeth and Oropharynx as per pre-operative assessment

## 2023-12-09 NOTE — Op Note (Addendum)
 PATIENT:  Jennifer [name]  78 y.o. female  PRE-OPERATIVE DIAGNOSIS:   1. Right surgical neck proximal humerus fracture 2. Rightcomminuted humeral shaft fracture  POST-OPERATIVE DIAGNOSIS:   1. Right surgical neck proximal humerus fracture 2. Right comminuted humeral shaft fracture  PROCEDURE:  Procedure(s): 1. OPEN REDUCTION INTERNAL FIXATION (ORIF) PROXIMAL HUMERUS FRACTURE (Right) 2. OPEN REDUCTION INTERNAL FIXATION (ORIF) LEFT HUMERAL SHAFT FRACTURE Right  SURGEON:  Surgeon(s) and Role:    DEWAINE Jennifer [name] Sharper, MD - Primary  PHYSICIAN ASSISTANT: Francis Mt, PA-C   ANESTHESIA:   general  I/O:  Total I/O In: 1705 [P.O.:240; I.V.:800; Blood:315; IV Piggyback:350] Out: 900 [Urine:500; Blood:400]  SPECIMEN:  No Specimen  TOURNIQUET:  * No tourniquets in log *  DICTATION: .Note written in EPIC  BRIEF SUMMARY OF INDICATION FOR PROCEDURE:  Jennifer [name] is a very pleasant 78 y.o. right-hand dominant femal who sustained a right proximal humerus fracture through the surgical neck as well as comminution well down the shaft. In order to most reliably restore function and reduce pain I recommended surgical repair.  I discussed risks of heart attack, stroke, infection, malunion, nonunion, loss of motion, DVT/ PE, loss of reduction, avascular necrosis, screw penetration, and need for further surgery, among others. Consent was provided to proceed.  BRIEF SUMMARY OF PROCEDURE:  After preoperative antibiotics, the patient was taken to the operating room where general anesthesia was induced. The standard deltopectoral approach was made after time-out.  Dissection was carried down to the interval where the superior lateral edge of the pec insertion was mobilized as well as the more medial anterior edge of the deltoid.  I was able to mobilize the head segment and identify the surgical neck fracture line and displaced head segment. Hematoma was evacuated with curettes and lavage. Provisional fixation  of this segment was then secured with K-wire fixation. C-arm confirmed appropriate alignment.   Next attention was turned to the shaft fracture which had significant comminution and required considerable distal extension of the skin incision. The biceps muscle was retracted medially and the brachialis split midline to access the shaft. Hematoma was evacuated with curettage and lavage. Provisional reduction was obtained with clamps and minifrag fixation. Instead of using two plates I selected the extra long Biomet humerus plate which could be used to span both fracture areas which had now been reduced and provisionally fixed. C arm confirmed reduction and restoration of proper head shaft orientation and tuberosity position.  This was followed by a screw fixation into the shaft in the slotted hole and then peg fixation into the head. The reduction of the humeral head and shaft was excellent and so we proceeded with additional fixation, including suture fixation of the tuberosities. Locked pegs were placed into the head and primarily standard screws into the shaft.  Final images showed appropriate reduction, hardware trajectory, and length. Outstanding clinical motion was feasible on the table, including abduction, and internal and external rotation. My assistant, Francis Mt, was present and assisting throughout.  An assistant was absolutely necessary for safe and effective completion as the reduction had to be held during provisional and definitive fixation.  The wound was thoroughly irrigated and then closed in a standard layered fashion using #1 Vicryl to reapproximate the superior edge of the pec and anterior edge of the delt and then 0 for reapproximation of the muscular interval, 2-0 Vicryl and nylon for the skin.  Sterile gently compressive dressing was applied and a sling with an Ace from hand to  upper arm.  The patient was taken to PACU in stable condition.  PROGNOSIS:  Jennifer [name] will have  unrestricted gentle passive range of motion of the operative shoulder at this time with active elbow, wrist, and digital motion.  Bone quality was moderately impaired, which is also significant with regard to future fall risk.     Jennifer [name]. Jennifer [name], M.D.

## 2023-12-09 NOTE — Progress Notes (Signed)
 No changes overnight.  The risks and benefits of right humerus repair were discussed with the patient, including the possibility of infection, nerve injury, vessel injury, wound breakdown, arthritis, symptomatic hardware, DVT/ PE, loss of motion, malunion, nonunion, and need for further surgery among others. These risks were acknowledged and consent was provided to proceed.  Ozell Bruch, MD [facility], [facility] [phone]

## 2023-12-09 NOTE — Anesthesia Procedure Notes (Addendum)
 Arterial Line Insertion Start/End9/11/2023 7:50 AM, 12/09/2023 7:55 AM Performed by: Keneth Lynwood POUR, MD, anesthesiologist  Patient location: Pre-op. Preanesthetic checklist: patient identified, IV checked, site marked, risks and benefits discussed, surgical consent, monitors and equipment checked, pre-op evaluation, timeout performed and anesthesia consent Lidocaine  1% used for infiltration Left, radial was placed Catheter size: 20 G Hand hygiene performed  and maximum sterile barriers used   Attempts: 1 Procedure performed using ultrasound guided technique. Ultrasound Notes:anatomy identified, needle tip was noted to be adjacent to the nerve/plexus identified, no ultrasound evidence of intravascular and/or intraneural injection and image(s) printed for medical record Following insertion, dressing applied. Post procedure assessment: normal and unchanged  Patient tolerated the procedure well with no immediate complications.

## 2023-12-09 NOTE — Progress Notes (Signed)
 [facility]                                                                               Jennifer [name], is a 78 y.o. female, DOB - [birth year], FMW:[id number] Admit date - 12/06/2023    Outpatient Primary MD for the patient is Katina Pfeiffer, PA-C  LOS - 2  days    Brief summary    Jennifer [name] is a 78 y.o. 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  She was sitting in her recliner and per her interpretation she stood up too fast.  She felt dizzy and fell into a windowsill. She denies any syncope. She was found to have comminuted right humerus fracture.    CT shoulder shows severely displaced and comminuted fracture involving the proximal right humeral shaft which extends proximally into the proximal right humeral head and neck.    CT Chest abd and pelvis shows Stranding within the subcutaneous soft tissues of the right breast/chest wall most likely reflecting hematoma.  Assessment & Plan    Assessment and Plan:     Comminuted fracture involving the proximal right humerus shaft Pain control.  Patient is at a high risk for any perioperative cardiac complications due to underlying valve issues.  Orthopedics on board.  Plan for surgical repair today.    Vitamin D  deficiency Replaced. Recheck in 6 months.    Acute blood loss anemia in the setting of eliquis  use 2 units of prbc transfusion.  Repeat H&H is around 8.  Check hemoglobin in am , transfuse to keep it greater than 8.  Anemia  panel wnl.    Atrial fibrillation Rate controlled on BB.    Chronic interstitial cystitis, in the setting of chronic ESBL Ecoli, MDR klebsiella  UA IS abnormal, she reports some burning while placing a purewick.  She self caths .  She follows up with urology outpatient.  Currently on meropenam, continue the same. Will follow up urine cultures and reach out to Infectious disease for antibiotic duration.    Severe AS HFpEF Holding all BP for now.     Hypertension Optimal BP parameters.     Hypothyroidism: Continue synthroid  Estimated body mass index is 38.16 kg/m as calculated from the following:   Height as of this encounter: 5' 2.5 (1.588 m).   Weight as of this encounter: 96.2 kg.  Code Status: full code.  DVT Prophylaxis:  SCDs Start: 12/07/23 1003   Level of Care: Level of care: Telemetry Medical Family Communication: none at bedside.  Disposition Plan:     Remains inpatient appropriate: pending.   Procedures:  Surgical repair of the right humerus fracture.   Consultants:   CARDIOLOGY Orthopedics.  Antimicrobials:   Anti-infectives (From admission, onward)    Start     Dose/Rate Route Frequency  Ordered Stop   12/08/23 2200  [MAR Hold]  meropenem  (MERREM ) 1 g in sodium chloride  0.9 % 100 mL IVPB        (MAR Hold since Mon 12/09/2023 at 0706.Hold Reason: Transfer to a Procedural area)   1 g 200 mL/hr over 30 Minutes Intravenous Every 8 hours 12/08/23 1337     12/07/23 1202  Urogesic-Blue 81.6 MG TABS 81.6 mg  Status:  Discontinued        1 tablet Oral Every 6 hours PRN 12/07/23 1203 12/07/23 1229   12/07/23 0645  meropenem  (MERREM ) 1 g in sodium chloride  0.9 % 100 mL IVPB  Status:  Discontinued        1 g 200 mL/hr over 30 Minutes Intravenous Every 12 hours 12/07/23 0635 12/08/23 1337        Medications  Scheduled Meds:  [MAR Hold] calcium  carbonate  1 tablet Oral TID WC   [MAR Hold] ezetimibe   10 mg Oral Daily   [MAR Hold] gabapentin   300 mg  Oral QHS   [MAR Hold] hydrALAZINE   50 mg Oral TID   [MAR Hold] isosorbide  dinitrate  10 mg Oral TID   [MAR Hold] metoprolol  tartrate  25 mg Oral BID   [MAR Hold] pantoprazole   40 mg Oral BID   [MAR Hold] rosuvastatin   5 mg Oral Once per day on Monday Wednesday Friday   scopolamine   1 patch Transdermal Q72H   [MAR Hold] sodium chloride  flush  3 mL Intravenous Q12H   [MAR Hold] sucralfate   1 g Oral TID WC & HS   [MAR Hold] Vitamin D  (Ergocalciferol )  50,000 Units Oral Q7 days   Continuous Infusions:  sodium chloride      lactated ringers      [MAR Hold] meropenem  (MERREM ) IV Stopped (12/09/23 0630)   PRN Meds:.[MAR Hold] acetaminophen , [MAR Hold] dextromethorphan -guaiFENesin , [MAR Hold] HYDROcodone -acetaminophen , [MAR Hold]  morphine  injection, [MAR Hold] ondansetron  **OR** [MAR Hold] ondansetron  (ZOFRAN ) IV, [MAR Hold] phenazopyridine     Subjective:   Jennifer [name] was seen and examined today.  Sleepy.   Objective:   Vitals:   12/09/23 0400 12/09/23 0600 12/09/23 0707 12/09/23 0727  BP: (!) 158/66   (!) 194/80  Pulse: (!) 51  (!) 55   Resp: 15 18 17    Temp: 98.2 F (36.8 C)  98.4 F (36.9 C)   TempSrc: Oral  Oral   SpO2: 100%  98% 90%  Weight:   96.2 kg   Height:   5' 2.5 (1.588 m)     Intake/Output Summary (Last 24 hours) at 12/09/2023 1042 Last data filed at 12/09/2023 1015 Gross per 24 hour  Intake 2030 ml  Output 2500 ml  Net -470 ml   Filed Weights   12/07/23 1536 12/09/23 0707  Weight: 96.2 kg 96.2 kg     Exam General exam: Appears calm and comfortable  Respiratory system: Clear to auscultation. Respiratory effort normal. Cardiovascular system: S1 & S2 heard, RRR. No JVD Gastrointestinal system: Abdomen is nondistended, soft and nontender.  Central nervous system: Alert and oriented.  Extremities: right upper extremity in sling.  Skin: extensive bruising of the right arm, shoulder and chest wall and face.     Data Reviewed:  I have personally reviewed  following labs and imaging studies   CBC Lab Results  Component Value Date   WBC 9.4 12/08/2023   RBC 2.87 (L) 12/08/2023   RBC 2.93 (L) 12/08/2023   HGB 8.2 (L) 12/08/2023   HCT 25.2 (L) 12/08/2023  MCV 87.8 12/08/2023   MCH 28.6 12/08/2023   PLT 198 12/08/2023   MCHC 32.5 12/08/2023   RDW 16.9 (H) 12/08/2023   LYMPHSABS 1.0 12/06/2023   MONOABS 1.3 (H) 12/06/2023   EOSABS 0.0 12/06/2023   BASOSABS 0.0 12/06/2023     Last metabolic panel Lab Results  Component Value Date   NA 138 12/08/2023   K 3.8 12/08/2023   CL 104 12/08/2023   CO2 26 12/08/2023   BUN 21 12/08/2023   CREATININE 0.91 12/08/2023   GLUCOSE 98 12/08/2023   GFRNONAA >60 12/08/2023   GFRAA 57 (L) 02/15/2017   CALCIUM  8.1 (L) 12/08/2023   PHOS 4.1 01/10/2021   PROT 5.4 (L) 12/08/2023   ALBUMIN  2.8 (L) 12/08/2023   LABGLOB 2.8 08/07/2022   AGRATIO 1.3 08/07/2022   BILITOT 1.8 (H) 12/08/2023   ALKPHOS 42 12/08/2023   AST 34 12/08/2023   ALT 27 12/08/2023   ANIONGAP 8 12/08/2023    CBG (last 3)  No results for input(s): GLUCAP in the last 72 hours.    Coagulation Profile: Recent Labs  Lab 12/06/23 2212  INR 1.6*     Radiology Studies: No results found.      Elgie Butter M.D. [facility] 12/09/2023, 10:42 AM  Available via Epic secure chat 7am-7pm After 7 pm, please refer to night coverage provider listed on amion.

## 2023-12-09 NOTE — Transfer of Care (Signed)
 Immediate Anesthesia Transfer of Care Note  Patient: Jennifer [name]  Procedure(s) Performed: OPEN REDUCTION INTERNAL FIXATION (ORIF) HUMERAL SHAFT FRACTURE (Right)  Patient Location: PACU  Anesthesia Type:General  Level of Consciousness: awake, alert , and oriented  Airway & Oxygen Therapy: Patient Spontanous Breathing and Patient connected to face mask oxygen  Post-op Assessment: Report given to RN and Post -op Vital signs reviewed and stable  Post vital signs: Reviewed and stable  Last Vitals:  Vitals Value Taken Time  BP 195/105 12/09/23 11:49  Temp 36.4 C 12/09/23 11:49  Pulse 67 12/09/23 11:53  Resp 22 12/09/23 11:53  SpO2 99 % 12/09/23 11:53  Vitals shown include unfiled device data.  Last Pain:  Vitals:   12/09/23 0727  TempSrc:   PainSc: 0-No pain      Patients Stated Pain Goal: 0 (12/08/23 2335)  Complications: No notable events documented.

## 2023-12-10 ENCOUNTER — Ambulatory Visit: Admitting: Internal Medicine

## 2023-12-10 DIAGNOSIS — I700 Atherosclerosis of aorta: Secondary | ICD-10-CM

## 2023-12-10 DIAGNOSIS — I10 Essential (primary) hypertension: Secondary | ICD-10-CM | POA: Diagnosis not present

## 2023-12-10 DIAGNOSIS — I2510 Atherosclerotic heart disease of native coronary artery without angina pectoris: Secondary | ICD-10-CM | POA: Diagnosis not present

## 2023-12-10 DIAGNOSIS — I350 Nonrheumatic aortic (valve) stenosis: Secondary | ICD-10-CM | POA: Diagnosis not present

## 2023-12-10 DIAGNOSIS — S42201D Unspecified fracture of upper end of right humerus, subsequent encounter for fracture with routine healing: Secondary | ICD-10-CM | POA: Diagnosis not present

## 2023-12-10 DIAGNOSIS — I4892 Unspecified atrial flutter: Secondary | ICD-10-CM

## 2023-12-10 DIAGNOSIS — I25118 Atherosclerotic heart disease of native coronary artery with other forms of angina pectoris: Secondary | ICD-10-CM

## 2023-12-10 DIAGNOSIS — S42301A Unspecified fracture of shaft of humerus, right arm, initial encounter for closed fracture: Secondary | ICD-10-CM | POA: Diagnosis not present

## 2023-12-10 DIAGNOSIS — D649 Anemia, unspecified: Secondary | ICD-10-CM | POA: Diagnosis not present

## 2023-12-10 DIAGNOSIS — D6869 Other thrombophilia: Secondary | ICD-10-CM

## 2023-12-10 DIAGNOSIS — N179 Acute kidney failure, unspecified: Secondary | ICD-10-CM | POA: Diagnosis not present

## 2023-12-10 DIAGNOSIS — E785 Hyperlipidemia, unspecified: Secondary | ICD-10-CM

## 2023-12-10 DIAGNOSIS — Z6837 Body mass index (BMI) 37.0-37.9, adult: Secondary | ICD-10-CM

## 2023-12-10 LAB — CBC
HCT: 27.2 % — ABNORMAL LOW (ref 36.0–46.0)
Hemoglobin: 8.8 g/dL — ABNORMAL LOW (ref 12.0–15.0)
MCH: 28.4 pg (ref 26.0–34.0)
MCHC: 32.4 g/dL (ref 30.0–36.0)
MCV: 87.7 fL (ref 80.0–100.0)
Platelets: 227 K/uL (ref 150–400)
RBC: 3.1 MIL/uL — ABNORMAL LOW (ref 3.87–5.11)
RDW: 15.9 % — ABNORMAL HIGH (ref 11.5–15.5)
WBC: 10.2 K/uL (ref 4.0–10.5)
nRBC: 0 % (ref 0.0–0.2)

## 2023-12-10 LAB — BASIC METABOLIC PANEL WITH GFR
Anion gap: 7 (ref 5–15)
BUN: 15 mg/dL (ref 8–23)
CO2: 25 mmol/L (ref 22–32)
Calcium: 8.1 mg/dL — ABNORMAL LOW (ref 8.9–10.3)
Chloride: 106 mmol/L (ref 98–111)
Creatinine, Ser: 0.88 mg/dL (ref 0.44–1.00)
GFR, Estimated: >60 mL/min (ref >60)
Glucose, Bld: 122 mg/dL — ABNORMAL HIGH (ref 70–99)
Potassium: 4.1 mmol/L (ref 3.5–5.1)
Sodium: 138 mmol/L (ref 135–145)

## 2023-12-10 MED ORDER — APIXABAN 5 MG PO TABS
5.0000 mg | ORAL_TABLET | Freq: Two times a day (BID) | ORAL | Status: DC
  Administered 2023-12-10 – 2023-12-13 (×6): 5 mg via ORAL
  Filled 2023-12-10 (×6): qty 1

## 2023-12-10 NOTE — Progress Notes (Signed)
 Rounding Note    Patient Name: Jennifer [name] Date of Encounter: 12/10/2023  [facility] Cardiologist: Stanly DELENA Leavens, MD   Subjective   Very conversant, reviewed findings of her echo, plans for outpatient follow up and management, see below.  Inpatient Medications    Scheduled Meds:  calcium  carbonate  1 tablet Oral TID WC   cephALEXin   500 mg Oral Q8H   docusate sodium   100 mg Oral BID   ezetimibe   10 mg Oral Daily   gabapentin   300 mg Oral QHS   hydrALAZINE   50 mg Oral TID   isosorbide  dinitrate  10 mg Oral TID   metoprolol  tartrate  25 mg Oral BID   pantoprazole   40 mg Oral BID   rosuvastatin   5 mg Oral Once per day on Monday Wednesday Friday   scopolamine   1 patch Transdermal Q72H   sodium chloride  flush  3 mL Intravenous Q12H   sucralfate   1 g Oral TID WC & HS   Vitamin D  (Ergocalciferol )  50,000 Units Oral Q7 days   Continuous Infusions:  sodium chloride      PRN Meds: acetaminophen , dextromethorphan -guaiFENesin , HYDROcodone -acetaminophen , menthol -cetylpyridinium **OR** phenol, morphine  injection, ondansetron  **OR** ondansetron  (ZOFRAN ) IV, phenazopyridine    Vital Signs    Vitals:   12/09/23 1313 12/09/23 1956 12/10/23 0413 12/10/23 0717  BP: (!) 156/64 (!) 152/52 (!) 151/51 (!) 138/48  Pulse: 68 65 (!) 56 (!) 55  Resp: 17 18 18    Temp: 97.6 F (36.4 C) 98.8 F (37.1 C) 97.8 F (36.6 C) 97.9 F (36.6 C)  TempSrc: Oral  Oral Oral  SpO2: 98% 98% 99% 99%  Weight:      Height:        Intake/Output Summary (Last 24 hours) at 12/10/2023 1017 Last data filed at 12/10/2023 0800 Gross per 24 hour  Intake 1735 ml  Output 1400 ml  Net 335 ml      12/09/2023    7:07 AM 12/07/2023    3:36 PM 05/30/2023    2:37 PM  Last 3 Weights  Weight (lbs) 212 lb 212 lb 209 lb  Weight (kg) 96.163 kg 96.163 kg 94.802 kg      Telemetry    SR - Personally Reviewed  Physical Exam   GEN: No acute distress.  Diffuse ecchymoses across entire face and  most of body Neck: No JVD appreciated Cardiac: RRR, no rubs, or gallops. 2-3/6 mid peaking systolic ejection murmur with audible S2 Respiratory: Clear to auscultation bilaterally. GI: Soft, nontender, non-distended  MS: No edema; No deformity. Neuro:  Nonfocal  Psych: Normal affect   New pertinent results (labs, ECG, imaging, cardiac studies)    Echo this admission personally reviewed, see below  Assessment & Plan    Moderate to severe aortic stenosis -EF 60-65%, severe LVH -aortic stenosis less severe on most recent echo. AVA 1.2, Vmax 2.95 m/s, mean gradient 22, DI 0.46, more consistent with moderate aortic stenosis -she is nervous about this, was scheduled to meet Dr. Wendel today to discuss TAVR evaluation. We will reschedule  Concern for amyloid -negative by MRI, but PYP was suggestive -discussed genetic testing with Dr. Leavens  CAD -cath 2022 with mid-distal LAD at D3 bifurcation not amenable to PCI -continue medical management with apixaban , ezetimibe , rosuvastatin   Paroxysmal atrial flutter -restart apixaban  when able postoperatively  Hypertension -restart home antihypertensives (spironolactone , metoprolol , losartan , hydralazine , isordil ) as able   [facility] will sign off.   Medication Recommendations:  as noted above, restart  home antihypertensives as able Other recommendations (labs, testing, etc):  none Follow up as an outpatient:  Will reschedule appt with Dr. Wendel and TAVR team that was scheduled for today.      Signed, Shelda Bruckner, MD  12/10/2023, 10:17 AM

## 2023-12-10 NOTE — Progress Notes (Signed)
 Mobility Specialist Progress Note:    12/10/23 1110  Mobility  Activity Pivoted/transferred to/from [facility]  Level of Assistance Minimal assist, patient does 75% or more (+2)  Assistive Device Other (Comment) (HHA)  RUE Weight Bearing Per Provider Order NWB  Activity Response Tolerated well  Mobility Referral Yes  Mobility visit 1 Mobility  Mobility Specialist Start Time (ACUTE ONLY) 1052  Mobility Specialist Stop Time (ACUTE ONLY) 1107  Mobility Specialist Time Calculation (min) (ACUTE ONLY) 15 min   Pt received in [facility] requesting assistance back to chair. Pt required MinA +2 to stand and transfer back to the chair.Pt c/o strong RUE pain, MS assisted in supporting it. Left in chair w/ call bell and personal belongings in reach. All needs met.   Thersia Minder Mobility Specialist  Please contact vis Secure Chat or  Rehab Office [phone]

## 2023-12-10 NOTE — Progress Notes (Signed)
 [facility]                                                                               Jennifer [name], is a 78 y.o. female, DOB - May 22, 1945, FMW:[id number] Admit date - 12/06/2023    Outpatient Primary MD for the patient is Jennifer Pfeiffer, PA-C  LOS - 3  days    Brief summary    Jennifer [name] is a 78 y.o. 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  She was sitting in her recliner and per her interpretation she stood up too fast.  She felt dizzy and fell into a windowsill. She denies any syncope. She was found to have comminuted right humerus fracture.    CT shoulder shows severely displaced and comminuted fracture involving the proximal right humeral shaft which extends proximally into the proximal right humeral head and neck.    CT Chest abd and pelvis shows Stranding within the subcutaneous soft tissues of the right breast/chest wall most likely reflecting hematoma.  Assessment & Plan    Assessment and Plan:     Comminuted fracture involving the proximal right humerus shaft Orthopedics on board.   Underwent ORIF of the right humerus.  Pain control and therapy evaluations.  Will defer to orthopedics to start the Eliquis .    Vitamin D  deficiency Replaced. Recheck in 6 months.    Acute blood loss anemia in the setting of eliquis  use 2 units of prbc transfusion.  Hemoglobin stable around 8.8.  transfuse to keep it greater than 8.  Anemia panel wnl.    Atrial fibrillation Rate  controlled on BB.    Chronic interstitial cystitis, in the setting of chronic ESBL Ecoli, MDR klebsiella  UA IS abnormal, she reports some burning while placing a purewick.  She self caths .  She follows up with urology outpatient.  Urine cultures growing klebsiella sensitive to keflex .    Severe AS HFpEF She was started on metoprolol , isordil  and hydralazine .      Hypertension Well controlled.     Hypothyroidism: Continue synthroid  Estimated body mass index is 38.16 kg/m as calculated from the following:   Height as of this encounter: 5' 2.5 (1.588 m).   Weight as of this encounter: 96.2 kg.  Code Status: full code.  DVT Prophylaxis:  SCD's Start: 12/09/23 1308 SCDs Start: 12/07/23 1003   Level of Care: Level of care: Telemetry Medical Family Communication: none at bedside.  Disposition Plan:     Remains inpatient appropriate: pending.   Procedures:  Surgical repair of the right humerus fracture.   Consultants:   CARDIOLOGY Orthopedics.  Antimicrobials:   Anti-infectives (From admission, onward)    Start     Dose/Rate Route Frequency Ordered Stop   12/09/23 1415  cephALEXin  (KEFLEX ) capsule 500 mg  500 mg Oral Every 8 hours 12/09/23 1324     12/08/23 2200  meropenem  (MERREM ) 1 g in sodium chloride  0.9 % 100 mL IVPB  Status:  Discontinued        1 g 200 mL/hr over 30 Minutes Intravenous Every 8 hours 12/08/23 1337 12/09/23 1323   12/07/23 1202  Urogesic-Blue 81.6 MG TABS 81.6 mg  Status:  Discontinued        1 tablet Oral Every 6 hours PRN 12/07/23 1203 12/07/23 1229   12/07/23 0645  meropenem  (MERREM ) 1 g in sodium chloride  0.9 % 100 mL IVPB  Status:  Discontinued        1 g 200 mL/hr over 30 Minutes Intravenous Every 12 hours 12/07/23 0635 12/08/23 1337        Medications  Scheduled Meds:  calcium  carbonate  1 tablet Oral TID WC   cephALEXin   500 mg Oral Q8H   docusate sodium   100 mg Oral BID   ezetimibe   10 mg Oral Daily   gabapentin    300 mg Oral QHS   hydrALAZINE   50 mg Oral TID   isosorbide  dinitrate  10 mg Oral TID   metoprolol  tartrate  25 mg Oral BID   pantoprazole   40 mg Oral BID   rosuvastatin   5 mg Oral Once per day on Monday Wednesday Friday   scopolamine   1 patch Transdermal Q72H   sodium chloride  flush  3 mL Intravenous Q12H   sucralfate   1 g Oral TID WC & HS   Vitamin D  (Ergocalciferol )  50,000 Units Oral Q7 days   Continuous Infusions:  sodium chloride      PRN Meds:.acetaminophen , dextromethorphan -guaiFENesin , HYDROcodone -acetaminophen , menthol -cetylpyridinium **OR** phenol, morphine  injection, ondansetron  **OR** ondansetron  (ZOFRAN ) IV, phenazopyridine     Subjective:   Jennifer [name] was seen and examined today.   Pain suboptimal.   Objective:   Vitals:   12/09/23 1313 12/09/23 1956 12/10/23 0413 12/10/23 0717  BP: (!) 156/64 (!) 152/52 (!) 151/51 (!) 138/48  Pulse: 68 65 (!) 56 (!) 55  Resp: 17 18 18    Temp: 97.6 F (36.4 C) 98.8 F (37.1 C) 97.8 F (36.6 C) 97.9 F (36.6 C)  TempSrc: Oral  Oral Oral  SpO2: 98% 98% 99% 99%  Weight:      Height:        Intake/Output Summary (Last 24 hours) at 12/10/2023 1237 Last data filed at 12/10/2023 1100 Gross per 24 hour  Intake 1360 ml  Output 1300 ml  Net 60 ml   Filed Weights   12/07/23 1536 12/09/23 0707  Weight: 96.2 kg 96.2 kg     Exam General exam: Appears calm and comfortable  Respiratory system: Clear to auscultation. Respiratory effort normal. Cardiovascular system: S1 & S2 heard, RRR.  Gastrointestinal system: Abdomen is nondistended, soft and nontender.  Central nervous system: Alert and oriented. No focal neurological deficits. Extremities: Symmetric 5 x 5 power. Skin: extensive bruising of the right arm, chest wall.  Psychiatry: anxious.       Data Reviewed:  I have personally reviewed following labs and imaging studies   CBC Lab Results  Component Value Date   WBC 10.2 12/10/2023   RBC 3.10 (L) 12/10/2023    HGB 8.8 (L) 12/10/2023   HCT 27.2 (L) 12/10/2023   MCV 87.7 12/10/2023   MCH 28.4 12/10/2023   PLT 227 12/10/2023   MCHC 32.4 12/10/2023   RDW 15.9 (H) 12/10/2023   LYMPHSABS 0.6 (L) 12/09/2023   MONOABS 0.3 12/09/2023  EOSABS 0.0 12/09/2023   BASOSABS 0.0 12/09/2023     Last metabolic panel Lab Results  Component Value Date   NA 138 12/10/2023   K 4.1 12/10/2023   CL 106 12/10/2023   CO2 25 12/10/2023   BUN 15 12/10/2023   CREATININE 0.88 12/10/2023   GLUCOSE 122 (H) 12/10/2023   GFRNONAA >60 12/10/2023   GFRAA 57 (L) 02/15/2017   CALCIUM  8.1 (L) 12/10/2023   PHOS 4.1 01/10/2021   PROT 5.4 (L) 12/08/2023   ALBUMIN  2.8 (L) 12/08/2023   LABGLOB 2.8 08/07/2022   AGRATIO 1.3 08/07/2022   BILITOT 1.8 (H) 12/08/2023   ALKPHOS 42 12/08/2023   AST 34 12/08/2023   ALT 27 12/08/2023   ANIONGAP 7 12/10/2023    CBG (last 3)  No results for input(s): GLUCAP in the last 72 hours.    Coagulation Profile: Recent Labs  Lab 12/06/23 2212  INR 1.6*     Radiology Studies: DG Shoulder Right Result Date: 12/09/2023 CLINICAL DATA:  Right humeral fracture fixation. EXAM: RIGHT SHOULDER - 2+ VIEW COMPARISON:  Same-day intraoperative radiographs. Preoperative radiographs 12/06/2023 and CT 12/07/2023. FINDINGS: Postsurgical changes from plate and screw fixation of the comminuted fracture of proximal humeral diaphysis. There is near anatomic reduction of the main fracture fragments. There is a small butterfly fragment proximally which remains mildly displaced medially. No evidence of dislocation or other complication. Surgical drain remains in place. IMPRESSION: Near anatomic reduction of the comminuted proximal humeral fracture status post ORIF. Electronically Signed   By: Elsie Perone M.D.   On: 12/09/2023 16:12   ECHOCARDIOGRAM COMPLETE Result Date: 12/09/2023    ECHOCARDIOGRAM REPORT   Patient Name:   Jennifer [name] Date of Exam: 12/09/2023 Medical Rec #:  [id number]         Height:       62.5 in Accession #:    [id number]       Weight:       212.0 lb Date of Birth:  1945/06/10        BSA:          1.971 m Patient Age:    78 years         BP:           156/64 mmHg Patient Gender: F                HR:           64 bpm. Exam Location:  Inpatient Procedure: 2D Echo, Cardiac Doppler, Color Doppler and Intracardiac            Opacification Agent (Both Spectral and Color Flow Doppler were            utilized during procedure). Indications:    Aortic stenosis I35.0  History:        Patient has prior history of Echocardiogram examinations, most                 recent 02/14/2023. CAD, Aortic Valve Disease; Risk                 Factors:Hypertension.  Sonographer:    Jayson Gaskins Referring Phys: [phone] HAO MENG IMPRESSIONS  1. Left ventricular ejection fraction, by estimation, is 60 to 65%. The left ventricle has normal function. The left ventricle has no regional wall motion abnormalities. There is severe left ventricular hypertrophy. Left ventricular diastolic parameters  are indeterminate.  2. Right ventricular systolic function is normal. The right ventricular size is normal.  3. Left atrial size was moderately dilated.  4. The mitral valve is degenerative. Trivial mitral valve regurgitation. No evidence of mitral stenosis. Moderate mitral annular calcification.  5. Gradients lower and AVA higher than TTE done on 02/14/23. The aortic valve is tricuspid. There is severe calcifcation of the aortic valve. There is severe thickening of the aortic valve. Aortic valve regurgitation is not visualized. Moderate aortic valve stenosis.  6. The inferior vena cava is normal in size with greater than 50% respiratory variability, suggesting right atrial pressure of 3 mmHg. FINDINGS  Left Ventricle: Left ventricular ejection fraction, by estimation, is 60 to 65%. The left ventricle has normal function. The left ventricle has no regional wall motion abnormalities. Strain was performed and the global  longitudinal strain is indeterminate. The left ventricular internal cavity size was normal in size. There is severe left ventricular hypertrophy. Left ventricular diastolic parameters are indeterminate. Right Ventricle: The right ventricular size is normal. No increase in right ventricular wall thickness. Right ventricular systolic function is normal. Left Atrium: Left atrial size was moderately dilated. Right Atrium: Right atrial size was normal in size. Pericardium: There is no evidence of pericardial effusion. Mitral Valve: The mitral valve is degenerative in appearance. There is moderate thickening of the mitral valve leaflet(s). There is moderate calcification of the mitral valve leaflet(s). Moderate mitral annular calcification. Trivial mitral valve regurgitation. No evidence of mitral valve stenosis. Tricuspid Valve: The tricuspid valve is normal in structure. Tricuspid valve regurgitation is not demonstrated. No evidence of tricuspid stenosis. Aortic Valve: Gradients lower and AVA higher than TTE done on 02/14/23. The aortic valve is tricuspid. There is severe calcifcation of the aortic valve. There is severe thickening of the aortic valve. Aortic valve regurgitation is not visualized. Moderate aortic stenosis is present. Aortic valve mean gradient measures 22.0 mmHg. Aortic valve peak gradient measures 34.8 mmHg. Aortic valve area, by VTI measures 1.17 cm. Pulmonic Valve: The pulmonic valve was normal in structure. Pulmonic valve regurgitation is not visualized. No evidence of pulmonic stenosis. Aorta: The aortic root is normal in size and structure. Venous: The inferior vena cava is normal in size with greater than 50% respiratory variability, suggesting right atrial pressure of 3 mmHg. IAS/Shunts: No atrial level shunt detected by color flow Doppler. Additional Comments: 3D was performed not requiring image post processing on an independent workstation and was indeterminate.  LEFT VENTRICLE PLAX 2D  LVIDd:         4.30 cm   Diastology LVIDs:         2.80 cm   LV e' medial:    5.55 cm/s LV PW:         1.50 cm   LV E/e' medial:  18.7 LV IVS:        1.60 cm   LV e' lateral:   6.31 cm/s LVOT diam:     1.80 cm   LV E/e' lateral: 16.5 LV SV:         90 LV SV Index:   46 LVOT Area:     2.54 cm  LEFT ATRIUM             Index LA Vol (A2C):   62.0 ml 31.45 ml/m LA Vol (A4C):   55.8 ml 28.31 ml/m LA Biplane Vol: 60.3 ml 30.59 ml/m  AORTIC VALVE AV Area (Vmax):    1.17 cm AV Area (Vmean):   1.20 cm AV Area (VTI):     1.17 cm AV Vmax:  295.00 cm/s AV Vmean:          219.000 cm/s AV VTI:            0.770 m AV Peak Grad:      34.8 mmHg AV Mean Grad:      22.0 mmHg LVOT Vmax:         136.00 cm/s LVOT Vmean:        103.000 cm/s LVOT VTI:          0.353 m LVOT/AV VTI ratio: 0.46  AORTA Ao Root diam: 2.90 cm MITRAL VALVE MV Area (PHT): 3.11 cm     SHUNTS MV Decel Time: 244 msec     Systemic VTI:  0.35 m MV E velocity: 104.00 cm/s  Systemic Diam: 1.80 cm MV A velocity: 128.00 cm/s MV E/A ratio:  0.81 Maude Emmer MD Electronically signed by Maude Emmer MD Signature Date/Time: 12/09/2023/3:33:53 PM    Final    DG Humerus Right Result Date: 12/09/2023 CLINICAL DATA:  Fracture, ORIF EXAM: RIGHT HUMERUS - 2+ VIEW COMPARISON:  12/06/2023 FINDINGS: Three fluoroscopic images are obtained during the performance of the procedure and are provided for interpretation only. Images demonstrate plate and screw fixation across the comminuted humeral diaphyseal fracture seen previously, with near anatomic alignment. Please refer to operative report. Fluoroscopy time: 22.6 seconds, 2.23 mGy IMPRESSION: 1. ORIF right humeral diaphyseal fracture.  Near anatomic alignment. Electronically Signed   By: Ozell Daring M.D.   On: 12/09/2023 14:59   DG C-Arm 1-60 Min-No Report Result Date: 12/09/2023 Fluoroscopy was utilized by the requesting physician.  No radiographic interpretation.   DG C-Arm 1-60 Min-No Report Result Date:  12/09/2023 Fluoroscopy was utilized by the requesting physician.  No radiographic interpretation.        Elgie Butter M.D. [facility] 12/10/2023, 12:37 PM  Available via Epic secure chat 7am-7pm After 7 pm, please refer to night coverage provider listed on amion.

## 2023-12-10 NOTE — Progress Notes (Signed)
 PHARMACY - ANTICOAGULATION CONSULT NOTE  Pharmacy Consult for apixaban  Indication: atrial fibrillation  Allergies  Allergen Reactions   Codeine Hives, Nausea And Vomiting and Rash   Dimethyl Sulfoxide Hives and Itching   Macrodantin  [Nitrofurantoin  Macrocrystal] Itching, Nausea And Vomiting and Rash    Abdominal and chest pain, also- blood-red skin, also   Moxifloxacin Hives, Shortness Of Breath, Swelling and Rash    Rash was severe, caused tremors, ans skin became BLOODY RED   Nitrofurantoin  Diarrhea, Itching, Rash and Other (See Comments)    GI/stomach upset, sweating, chest pain, and skin turned bloody red   Quinolones Anaphylaxis   Sulfa Antibiotics Hives and Rash   Tape Rash   Topiramate Itching   Furosemide  Other (See Comments)    CANNOT TAKE DUE TO Interstitial cystitis   Duloxetine     Other reaction(s): Other (See Comments)   Duloxetine Hcl Nausea Only and Other (See Comments)    Vertigo and heart racing also    Ranexa  [Ranolazine ]     Constipation/heart pounding   Robaxin [Methocarbamol] Other (See Comments)    Make me feel flu-like symptoms    Patient Measurements: Height: 5' 2.5 (158.8 cm) Weight: 96.2 kg (212 lb) IBW/kg (Calculated) : 51.25 HEPARIN  DW (KG): 73.7  Vital Signs: Temp: 98.4 F (36.9 C) (09/09 1548) Temp Source: Oral (09/09 1548) BP: 114/45 (09/09 1548) Pulse Rate: 54 (09/09 1548)  Labs: Recent Labs    12/08/23 0405 12/09/23 1426 12/10/23 0519  HGB 8.2* 10.1* 8.8*  HCT 25.2* 31.3* 27.2*  PLT 198 207 227  CREATININE 0.91  --  0.88    Estimated Creatinine Clearance: 59.5 mL/min (by C-G formula based on SCr of 0.88 mg/dL).   Medical History: Past Medical History:  Diagnosis Date   Acute meniscal tear of left knee    FOLLOWED BY DR GIAFFREY   Anemia    Aortic stenosis    Ascending aorta dilation (HCC)    Atrial flutter (HCC)    AV block, Mobitz 1    BPPV (benign paroxysmal positional vertigo)    CAD (coronary artery  disease)    Chronic bladder pain    Chronic fatigue syndrome    Chronic low back pain    W/ RIGHT LEG PAIN AND NUMBNESS   Cystitis, chronic    Fibromyalgia    GERD (gastroesophageal reflux disease)    Hiatal hernia    Hypertension    IC (interstitial cystitis)    LBBB (left bundle branch block)    transient hx   OSA (obstructive sleep apnea)    per pt study yrs ago-- moderate osa ,  intolerant cpap   Pinched vertebral nerve    bilateral L2 -- L3 and L3 -- L4-/  epidural injection's treatment , PT and pain clince   PONV (postoperative nausea and vomiting)    severe   PVC's (premature ventricular contractions)    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    Self-catheterizes urinary bladder    Spinal stenosis, lumbar region with neurogenic claudication    Wears glasses    Wears partial dentures    upper and lower    Medications:  Medications Prior to Admission  Medication Sig Dispense Refill Last Dose/Taking   acetaminophen  (TYLENOL ) 500 MG tablet Take 500 mg by mouth 2 (two) times daily as needed (for pain).   Past Month   aluminum hydroxide-magnesium  carbonate (GAVISCON) 95-358 MG/15ML SUSP Take by mouth as needed for indigestion or  heartburn.   12/03/2023   apixaban  (ELIQUIS ) 5 MG TABS tablet Take 1 tablet (5 mg total) by mouth 2 (two) times daily. 180 tablet 1 12/06/2023 at  5:00 PM   dextromethorphan -guaiFENesin  (MUCINEX  DM) 30-600 MG 12hr tablet Take 1 tablet by mouth every 12 (twelve) hours as needed for cough.   Unknown   ezetimibe  (ZETIA ) 10 MG tablet Take 1 tablet (10 mg total) by mouth daily. 90 tablet 3 Taking   fexofenadine (ALLEGRA) 60 MG tablet Take 60 mg by mouth daily as needed for allergies.   Past Month   furosemide  (LASIX ) 40 MG tablet May take an extra dose once daily as needed for swelling 90 tablet 3 12/04/2023   gabapentin  (NEURONTIN ) 300 MG capsule Take 300 mg by mouth at bedtime.   12/04/2023   hydrALAZINE  (APRESOLINE ) 50 MG tablet  TAKE 1 TABLET BY MOUTH THREE TIMES A DAY 270 tablet 3 12/04/2023   HYDROcodone -acetaminophen  (NORCO) 7.5-325 MG tablet Take 1 tablet by mouth 3 (three) times daily as needed.   12/06/2023 at  6:30 PM   isosorbide  dinitrate (ISORDIL ) 10 MG tablet TAKE 1 TABLET BY MOUTH THREE TIMES A DAY 270 tablet 3 12/04/2023   losartan  (COZAAR ) 100 MG tablet TAKE 1 TABLET BY MOUTH EVERY DAY 90 tablet 3 12/04/2023   meclizine  (ANTIVERT ) 25 MG tablet Take 25 mg by mouth 3 (three) times daily as needed for dizziness.   12/06/2023   metoprolol  tartrate (LOPRESSOR ) 25 MG tablet TAKE 1 TABLET BY MOUTH TWICE A DAY 180 tablet 3 12/04/2023   nitroGLYCERIN  (NITROSTAT ) 0.4 MG SL tablet PLACE 1 TABLET UNDER THE TONGE EVERY 5 MINUTES AS NEEDED FOR CHEST PAIN *TAKE UP TO 3 TABLETS* 25 tablet 3 12/06/2023 Evening   nystatin  cream (MYCOSTATIN ) Apply topically 2 (two) times daily. 30 g 3 Unknown   ondansetron  (ZOFRAN -ODT) 4 MG disintegrating tablet Take 4 mg by mouth every 8 (eight) hours as needed for nausea (dissolve orally).  1 12/06/2023   pantoprazole  (PROTONIX ) 40 MG tablet Take 40 mg by mouth 2 (two) times daily.   12/06/2023   polyethylene glycol powder (GLYCOLAX/MIRALAX) 17 GM/SCOOP powder Take 17 g by mouth daily as needed for mild constipation.   Past Week   spironolactone  (ALDACTONE ) 25 MG tablet Take 1 tablet (25 mg total) by mouth daily. 90 tablet 2 12/04/2023   sucralfate  (CARAFATE ) 1 g tablet Take 1 tablet (1 g total) by mouth 4 (four) times daily -  with meals and at bedtime. (Patient taking differently: Take 1 g by mouth daily at 6 (six) AM. 1 tab in am, 1 tab in pm) 120 tablet 3 12/06/2023   cyanocobalamin  (VITAMIN B12) 1000 MCG tablet Take 1,000 mcg by mouth daily. (Patient not taking: Reported on 12/07/2023)   Not Taking   fluconazole  (DIFLUCAN ) 100 MG tablet Take 100 mg by mouth daily. (Patient not taking: Reported on 12/07/2023)   Not Taking   hydrocortisone  (ANUSOL -HC) 25 MG suppository Place 25 mg rectally 2 (two) times daily as  needed for hemorrhoids. (Patient not taking: Reported on 12/07/2023)   Not Taking   Methen-Hyosc-Meth Blue-Na Phos (UROGESIC-BLUE) 81.6 MG TABS Take 1 tablet by mouth every 6 (six) hours as needed (urinary burning).      rosuvastatin  (CRESTOR ) 5 MG tablet Take 1 tablet (5 mg total) by mouth 3 (three) times a week. 36 tablet 3    senna (SENOKOT) 8.6 MG tablet Take 1 tablet by mouth as needed for constipation. (Patient not taking: Reported on 12/07/2023)  Not Taking   triamcinolone  cream (KENALOG ) 0.1 % Apply 1 application. topically 2 (two) times daily as needed (rash). (Patient not taking: Reported on 12/07/2023)   Not Taking   Scheduled:   calcium  carbonate  1 tablet Oral TID WC   cephALEXin   500 mg Oral Q8H   docusate sodium   100 mg Oral BID   ezetimibe   10 mg Oral Daily   gabapentin   300 mg Oral QHS   hydrALAZINE   50 mg Oral TID   isosorbide  dinitrate  10 mg Oral TID   metoprolol  tartrate  25 mg Oral BID   pantoprazole   40 mg Oral BID   rosuvastatin   5 mg Oral Once per day on Monday Wednesday Friday   scopolamine   1 patch Transdermal Q72H   sodium chloride  flush  3 mL Intravenous Q12H   sucralfate   1 g Oral TID WC & HS   Vitamin D  (Ergocalciferol )  50,000 Units Oral Q7 days    Assessment: 78 yo female with history of afib and apixaban  has been on hold for procedure. She is now s/p fracture repair on 9/8. Pharmacy consulted to resume apixaban   -Hg= 8.8, plt= 227 -SCr 0.88, wt= 96kg -home apixaban  dose: 5mg  po bid  Goal of Therapy:  Monitor platelets by anticoagulation protocol: Yes   Plan:  -restart apixaban  5mg  po bid tonight  Prentice Poisson, PharmD Clinical Pharmacist **Pharmacist phone directory can now be found on [email or web address] ([email or web address]).  Listed under [facility].

## 2023-12-10 NOTE — Progress Notes (Signed)
 Stopped by to see Jennifer [name] as pcp/ID f/u, asked by [facility].  She has multiple bruises from her (mechanical) fall as well as fracture of R humerus. She delayed coming to ED so that she could pay her rent. She denies LOC.  She has questions about her Ao valve and TTE (moderate stenosis, LVEF preserved, severe LVH).  Would ask for CVTS eval while in house.  Holding eliquis  post fall.

## 2023-12-10 NOTE — Evaluation (Signed)
 Physical Therapy Evaluation Patient Details Name: Jennifer [name] MRN: [id number] DOB: 1945-05-18 Today's Date: 12/10/2023  History of Present Illness  Pt is a 78 y.o. female presenting 9/6 after fall at home on 9/3 resulting in R arm pain, nausea. Found to have L surgical neck proximal humerus fx, comminuted humeral shaft fx; s/p ORIF 9/8. Also found to have R chest wall hematoma. PMH: aortic stenosis, atrial flutter, BPPV, CAD, fibromyalgia, GERD, HTN, LBB, OSA.  Clinical Impression  Pt admitted with above diagnosis. Pt from home in 3rd floor apartment alone. Has been independent within her apt but is limited in community mobility by needing to go up/ down stairs to get out and has chronic back pain/ LE weakness from back issues. Pt also reports that she in and out caths herself at home and has been unable to do this without use of R arm. Pt seen by PT and OT for pt safety. Needed mod A +2 to stand up EOB and had strong posterior bias as well as knees buckling first time up and pt having difficulty placing feet in position of stability. Pt pivoted to recliner with mod A +2 and quad cane. Could not further progress ambulation today due to instability. Patient will benefit from intensive inpatient follow-up therapy, >3 hours/day to return to independence.  Pt currently with functional limitations due to the deficits listed below (see PT Problem List). Pt will benefit from acute skilled PT to increase their independence and safety with mobility to allow discharge.           If plan is discharge home, recommend the following: Two people to help with walking and/or transfers;A lot of help with bathing/dressing/bathroom;Assistance with cooking/housework;Assist for transportation;Help with stairs or ramp for entrance   Can travel by private vehicle        Equipment Recommendations Wheelchair (measurements PT);Wheelchair cushion (measurements PT);BSC/3in1;Cane  Recommendations for Other Services   Rehab consult    Functional Status Assessment Patient has had a recent decline in their functional status and demonstrates the ability to make significant improvements in function in a reasonable and predictable amount of time.     Precautions / Restrictions Precautions Precautions: Shoulder Type of Shoulder Precautions: Sling: For comfort and sleep. Non weight bearing: Yes AROM elbow, wrist and hand to tolerance: Yes. OK for pendulums: Yes. Forward Flexion: 0-90 Abduction: 0-60 External Rotation: 0-30 AROM of shoulder: No. OT Consult Special Instructions: no active shoulder abduction Shoulder Interventions: For comfort;Shoulder sling/immobilizer (no shoulder sling in room on arrival; ordered and messaged ortho tech) Precaution Booklet Issued: Yes (comment) Recall of Precautions/Restrictions: Impaired Precaution/Restrictions Comments: needing redirection to focus on new learning topics Restrictions Weight Bearing Restrictions Per Provider Order: Yes RUE Weight Bearing Per Provider Order: Non weight bearing      Mobility  Bed Mobility Overal bed mobility: Needs Assistance Bed Mobility: Supine to Sit     Supine to sit: Mod assist     General bed mobility comments: cues for technique with HOB elevated. significantly increased time to scoot toward EOB, +2 from behind for safety    Transfers Overall transfer level: Needs assistance Equipment used: 1 person hand held assist Transfers: Sit to/from Stand, Bed to chair/wheelchair/BSC Sit to Stand: Mod assist, +2 physical assistance, +2 safety/equipment   Step pivot transfers: Mod assist, +2 physical assistance, +2 safety/equipment       General transfer comment: mod A +2 for rise from EOB x2 with pt with significant retropulsion on first attempt; cues for LE  placmeent and hand placement during both attempts. on second attempt, greater forward weight shift with pt able to then take steps to chair with 1 person HHA and +2 hands on asisst  moving toward her R    Ambulation/Gait               General Gait Details: deferred due to decreased safety  Stairs            Wheelchair Mobility     Tilt Bed    Modified Rankin (Stroke Patients Only)       Balance Overall balance assessment: Needs assistance Sitting-balance support: No upper extremity supported, Feet supported Sitting balance-Leahy Scale: Fair Sitting balance - Comments: posterior bias Postural control: Posterior lean Standing balance support: Single extremity supported, During functional activity Standing balance-Leahy Scale: Poor Standing balance comment: posterior lean, bracing againsy bed. Pt had difficulty getting her feet placed under her.                             Pertinent Vitals/Pain Pain Assessment Pain Assessment: Faces Faces Pain Scale: Hurts even more Pain Location: R shoulder, RLE with transfers Pain Descriptors / Indicators: Sore, Grimacing, Guarding Pain Intervention(s): Limited activity within patient's tolerance, Monitored during session, Premedicated before session    Home Living Family/patient expects to be discharged to:: Private residence Living Arrangements: Alone Available Help at Discharge: Friend(s);Available PRN/intermittently (when friends not at work) Type of Home: Apartment Home Access: Stairs to enter Entrance Stairs-Rails: Right;Left;Can reach both Entrance Stairs-Number of Steps: 17+7+8 (on third floor)   Home Layout: One level Home Equipment: Grab bars - tub/shower;Rolling Walker (2 wheels) Additional Comments: reports that she has no family nearby. Has friends that get her groceries but  cannot help her physically    Prior Function Prior Level of Function : Independent/Modified Independent (within her apartment)             Mobility Comments: lumbar stenosis and sciatica at baseline; RW. Reports that she does not go up and down flights of steps on her bad days ADLs Comments:  friends usually assist with getting groceries     Extremity/Trunk Assessment   Upper Extremity Assessment Upper Extremity Assessment: Defer to OT evaluation RUE Deficits / Details: pt with limited AROM of elbow flexion at time fo eval; MD entering room after transfer, so will return for further education regarding elbow/wrist/hand exercises RUE Coordination: decreased fine motor;decreased gross motor    Lower Extremity Assessment Lower Extremity Assessment: Generalized weakness;RLE deficits/detail RLE Deficits / Details: pt with c/o discomfort down RLE>LLE and knees buckling with initial standing RLE Sensation: history of peripheral neuropathy;decreased proprioception RLE Coordination: decreased gross motor    Cervical / Trunk Assessment Cervical / Trunk Assessment: Kyphotic  Communication   Communication Communication: No apparent difficulties    Cognition Arousal: Alert Behavior During Therapy: WFL for tasks assessed/performed   PT - Cognitive impairments: Attention                       PT - Cognition Comments: pt very verbose about her medical problems and limitations and needs frequent redirection to task. AxOx4 Following commands: Impaired Following commands impaired: Follows one step commands with increased time, Follows multi-step commands inconsistently     Cueing Cueing Techniques: Verbal cues, Tactile cues     General Comments General comments (skin integrity, edema, etc.): discussed life alert upon return to apt as pt could not get up  and door was locked    Exercises     Assessment/Plan    PT Assessment Patient needs continued PT services  PT Problem List Decreased strength;Decreased activity tolerance;Decreased balance;Decreased mobility;Decreased coordination;Decreased safety awareness;Decreased knowledge of use of DME;Decreased knowledge of precautions;Pain;Impaired sensation       PT Treatment Interventions DME instruction;Gait  training;Stair training;Functional mobility training;Therapeutic activities;Therapeutic exercise;Balance training;Neuromuscular re-education;Cognitive remediation;Patient/family education    PT Goals (Current goals can be found in the Care Plan section)  Acute Rehab PT Goals Patient Stated Goal: go to inpatient rehab PT Goal Formulation: With patient Time For Goal Achievement: 12/24/23 Potential to Achieve Goals: Good    Frequency Min 2X/week     Co-evaluation PT/OT/SLP Co-Evaluation/Treatment: Yes Reason for Co-Treatment: For patient/therapist safety;To address functional/ADL transfers PT goals addressed during session: Mobility/safety with mobility;Proper use of DME OT goals addressed during session: ADL's and self-care;Strengthening/ROM;Proper use of Adaptive equipment and DME       AM-PAC PT 6 Clicks Mobility  Outcome Measure Help needed turning from your back to your side while in a flat bed without using bedrails?: A Lot Help needed moving from lying on your back to sitting on the side of a flat bed without using bedrails?: A Lot Help needed moving to and from a bed to a chair (including a wheelchair)?: A Lot Help needed standing up from a chair using your arms (e.g., wheelchair or bedside chair)?: Total Help needed to walk in hospital room?: Total Help needed climbing 3-5 steps with a railing? : Total 6 Click Score: 9    End of Session Equipment Utilized During Treatment: Gait belt Activity Tolerance: Patient limited by pain Patient left: in chair;with call bell/phone within reach;with chair alarm set Nurse Communication: Mobility status;Other (comment) (2 person transfers) PT Visit Diagnosis: Unsteadiness on feet (R26.81);History of falling (Z91.81);Difficulty in walking, not elsewhere classified (R26.2);Pain;Muscle weakness (generalized) (M62.81);Repeated falls (R29.6) Pain - Right/Left: Right Pain - part of body: Shoulder    Time: 9146-9062 PT Time Calculation  (min) (ACUTE ONLY): 44 min   Charges:   PT Evaluation $PT Eval Moderate Complexity: 1 Mod   PT General Charges $$ ACUTE PT VISIT: 1 Visit         Richerd Lipoma, PT  Acute Rehab Services Secure chat preferred Office [phone]   Richerd CROME Yashvi Jasinski 12/10/2023, 11:51 AM

## 2023-12-10 NOTE — Progress Notes (Signed)
   Inpatient Rehab Admissions Coordinator :  Per therapy recommendations, patient was screened for CIR candidacy by Heron Leavell RN MSN.  Noted that she is unable to complete I and O caths at home without the use of her right arm and has limited supports.At this time patient appears to need SNF rehab for CIR admit will not resolve the need for assistance at home for her extended functional needs. Recommend other rehab venue at this time. Please call me with any questions.  Heron Leavell RN MSN Admissions Coordinator [phone]

## 2023-12-10 NOTE — Progress Notes (Signed)
 [facility] Progress Note  Patient ID: Jennifer [name] MRN: [id number] DOB/AGE: [birth year] 78 y.o.  Subjective:  Doing ok this am  Pain tolerable   Would prefer CIR but ok with SNF   ROS As above  Today's  total administered Morphine  Milligram Equivalents: 19.5 Yesterday's total administered Morphine  Milligram Equivalents: 138  Objective:   VITALS:   Vitals:   12/09/23 1313 12/09/23 1956 12/10/23 0413 12/10/23 0717  BP: (!) 156/64 (!) 152/52 (!) 151/51 (!) 138/48  Pulse: 68 65 (!) 56 (!) 55  Resp: 17 18 18    Temp: 97.6 F (36.4 C) 98.8 F (37.1 C) 97.8 F (36.6 C) 97.9 F (36.6 C)  TempSrc: Oral  Oral Oral  SpO2: 98% 98% 99% 99%  Weight:      Height:        Estimated body mass index is 38.16 kg/m as calculated from the following:   Height as of this encounter: 5' 2.5 (1.588 m).   Weight as of this encounter: 96.2 kg.   Intake/Output      09/08 0701 09/09 0700 09/09 0701 09/10 0700   P.O. 880 480   I.V. (mL/kg) 800 (8.3)    Blood 315    IV Piggyback 350    Total Intake(mL/kg) 2345 (24.4) 480 (5)   Urine (mL/kg/hr) 1800 (0.8) 0 (0)   Emesis/NG output  0   Other  0   Stool  0   Blood 400 0   Total Output 2200 0   Net +145 +480        Urine Occurrence  1 x   Stool Occurrence  1 x   Emesis Occurrence  0 x     LABS  Results for orders placed or performed during the hospital encounter of 12/06/23 (from the past 24 hours)  CBC with Differential/Platelet     Status: Abnormal   Collection Time: 12/09/23  2:26 PM  Result Value Ref Range   WBC 10.2 4.0 - 10.5 K/uL   RBC 3.53 (L) 3.87 - 5.11 MIL/uL   Hemoglobin 10.1 (L) 12.0 - 15.0 g/dL   HCT 68.6 (L) 63.9 - 53.9 %   MCV 88.7 80.0 - 100.0 fL   MCH 28.6 26.0 - 34.0 pg   MCHC 32.3 30.0 - 36.0 g/dL   RDW 84.1 (H) 88.4 - 84.4 %   Platelets 207 150 - 400 K/uL   nRBC 0.0 0.0 - 0.2 %   Neutrophils Relative % 89 %    Neutro Abs 9.0 (H) 1.7 - 7.7 K/uL   Lymphocytes Relative 6 %   Lymphs Abs 0.6 (L) 0.7 - 4.0 K/uL   Monocytes Relative 3 %   Monocytes Absolute 0.3 0.1 - 1.0 K/uL   Eosinophils Relative 0 %   Eosinophils Absolute 0.0 0.0 - 0.5 K/uL   Basophils Relative 0 %   Basophils Absolute 0.0 0.0 - 0.1 K/uL   Immature Granulocytes 2 %   Abs Immature Granulocytes 0.15 (H) 0.00 - 0.07 K/uL  Basic metabolic panel with GFR     Status: Abnormal   Collection Time: 12/10/23  5:19 AM  Result Value Ref Range   Sodium 138 135 - 145 mmol/L   Potassium 4.1 3.5 - 5.1 mmol/L   Chloride 106 98 - 111 mmol/L   CO2 25 22 -  32 mmol/L   Glucose, Bld 122 (H) 70 - 99 mg/dL   BUN 15 8 - 23 mg/dL   Creatinine, Ser 9.11 0.44 - 1.00 mg/dL   Calcium  8.1 (L) 8.9 - 10.3 mg/dL   GFR, Estimated >39 >39 mL/min   Anion gap 7 5 - 15  CBC     Status: Abnormal   Collection Time: 12/10/23  5:19 AM  Result Value Ref Range   WBC 10.2 4.0 - 10.5 K/uL   RBC 3.10 (L) 3.87 - 5.11 MIL/uL   Hemoglobin 8.8 (L) 12.0 - 15.0 g/dL   HCT 72.7 (L) 63.9 - 53.9 %   MCV 87.7 80.0 - 100.0 fL   MCH 28.4 26.0 - 34.0 pg   MCHC 32.4 30.0 - 36.0 g/dL   RDW 84.0 (H) 88.4 - 84.4 %   Platelets 227 150 - 400 K/uL   nRBC 0.0 0.0 - 0.2 %     PHYSICAL EXAM:   Gen: sitting in bedside chair, looks good, pleasant as always  Lungs: unlabored  Ext:       Right upper extremity   Prevena with good seal  No drainage in canister  Ext warm   Swelling stable  + radial pulse    Radial, ulnar, median nerve motor and sensory functions intact.  AIN and PIN intact  No pain with passive stretching of digits   Assessment/Plan: 1 Day Post-Op   Principal Problem:   Right humeral fracture   Anti-infectives (From admission, onward)    Start     Dose/Rate Route Frequency Ordered Stop   12/09/23 1415  cephALEXin  (KEFLEX ) capsule 500 mg        500 mg Oral Every 8 hours 12/09/23 1324     12/08/23 2200  meropenem  (MERREM ) 1 g in sodium chloride  0.9 % 100  mL IVPB  Status:  Discontinued        1 g 200 mL/hr over 30 Minutes Intravenous Every 8 hours 12/08/23 1337 12/09/23 1323   12/07/23 1202  Urogesic-Blue 81.6 MG TABS 81.6 mg  Status:  Discontinued        1 tablet Oral Every 6 hours PRN 12/07/23 1203 12/07/23 1229   12/07/23 0645  meropenem  (MERREM ) 1 g in sodium chloride  0.9 % 100 mL IVPB  Status:  Discontinued        1 g 200 mL/hr over 30 Minutes Intravenous Every 12 hours 12/07/23 0635 12/08/23 1337     .  POD/HD#: 9  78 year old female s/p fall with highly comminuted right proximal humerus and humeral shaft fracture  - Fall  -highly comminuted right proximal humerus and humeral shaft fracture s/p ORIF  Nonweightbearing right upper extremity  No lifting with right arm  Unrestricted range of motion of right elbow, forearm wrist and hand  Passive shoulder flexion and extension  Passive shoulder abduction  No active shoulder abduction for approximately 6 weeks   Sling for comfort  OT and PT evaluations   Continue with ice for swelling and pain control   DC Prevena upon discharge   - Pain management:  Multimodal  - ABL anemia/Hemodynamics  CBC looks good today.  Did receive 1 unit intraoperatively  - Medical issues   Per primary and cardiology  - DVT/PE prophylaxis:  Okay to resume Eliquis  from Ortho standpoint  - ID:   Perioperative antibiotics  - Metabolic Bone Disease:  Vitamin d  insufficiency    Supplement   - Activity:  As above  - FEN/GI prophylaxis/Foley/Lines:  Diet as tolerated   - Impediments to fracture healing:  Vitamin d  insufficiency   Fragility fracture   - Dispo:  Ortho issues stable   SNF vs CIR   Francis MICAEL Mt, PA-C [phone] (C) 12/10/2023, 2:20 PM  [facility] [address] [place] [address] [phone] (F)    After 5pm and on the weekends please log on to Amion, go to orthopaedics and the look under the [facility]  Call for the provider(s) on call. You can also call our office at [phone] and then follow the prompts to be connected to the call team.  Patient ID: Jennifer [name] Coil, female   DOB: 06/21/1945, 78 y.o.   MRN: [id number]

## 2023-12-10 NOTE — Evaluation (Signed)
 Occupational Therapy Evaluation Patient Details Name: Jennifer [name] MRN: [id number] DOB: 04-12-1945 Today's Date: 12/10/2023   History of Present Illness   Pt is a 78 y.o. female presenting 9/6 after fall at home on 9/3 resulting in R arm pain, nausea. Found to have L surgical neck proximal humerus fx, comminuted humeral shaft fx; s/p ORIF 9/8. Also found to have R chest wall hematoma. PMH: aortic stenosis, atrial flutter, BPPV, CAD, fibromyalgia, GERD, HTN, LBB, OSA.     Clinical Impressions PTA, pt lived alone and was independent within her apartment, relying on friends for transportation and grocery shopping. Upon eval, pt needing mod A +2 for transfers, total A for LB ADL, and mod-max A for UB ADL. Pt educated regarding precautions for NWB and ROM restrictions. Pt max internally distracted during session, so deferred exercises to R UE to end of session, but MD in room, so OT to return for further education regarding elbow/wrist/hand exercises. Additionally ordered shoulder sling as one not present in room.      If plan is discharge home, recommend the following:   Two people to help with walking and/or transfers;A lot of help with bathing/dressing/bathroom;Two people to help with bathing/dressing/bathroom;Assistance with cooking/housework;Assist for transportation;Help with stairs or ramp for entrance     Functional Status Assessment   Patient has had a recent decline in their functional status and demonstrates the ability to make significant improvements in function in a reasonable and predictable amount of time.     Equipment Recommendations   Other (comment) (defer)     Recommendations for Other Services   Rehab consult     Precautions/Restrictions   Precautions Precautions: Shoulder Type of Shoulder Precautions: Sling: For comfort and sleep. Non weight bearing: Yes AROM elbow, wrist and hand to tolerance: Yes. OK for pendulums: Yes. Forward Flexion: 0-90  Abduction: 0-60 External Rotation: 0-30 AROM of shoulder: No. OT Consult Special Instructions: no active shoulder abduction Shoulder Interventions: For comfort;Shoulder sling/immobilizer (no shoulder sling in room on arrival; ordered and messaged ortho tech) Precaution Booklet Issued: Yes (comment) Recall of Precautions/Restrictions: Impaired Precaution/Restrictions Comments: needing redirection to focus on new learning topics Restrictions Weight Bearing Restrictions Per Provider Order: Yes RUE Weight Bearing Per Provider Order: Non weight bearing     Mobility Bed Mobility Overal bed mobility: Needs Assistance Bed Mobility: Supine to Sit     Supine to sit: Mod assist     General bed mobility comments: cues for technique with HOB elevated. significantly increased time to scoot toward EOB    Transfers Overall transfer level: Needs assistance Equipment used: 1 person hand held assist Transfers: Sit to/from Stand, Bed to chair/wheelchair/BSC Sit to Stand: Mod assist, +2 physical assistance, +2 safety/equipment     Step pivot transfers: Mod assist, +2 physical assistance, +2 safety/equipment     General transfer comment: mod A +2 for rise from EOB x2 with pt with significant retropulsion on first attempt; cues for LE placmeent and hand placement during both attempts. on second attempt, greater forward weight shift with pt able to then take steps to chair with 1 person HHA and +2 hands on asisst moving toward her R      Balance Overall balance assessment: Needs assistance Sitting-balance support: No upper extremity supported, Feet supported Sitting balance-Leahy Scale: Fair     Standing balance support: Single extremity supported, During functional activity Standing balance-Leahy Scale: Poor  ADL either performed or assessed with clinical judgement   ADL Overall ADL's : Needs assistance/impaired Eating/Feeding: Minimal  assistance;Sitting   Grooming: Minimal assistance;Sitting   Upper Body Bathing: Maximal assistance;Sitting   Lower Body Bathing: Total assistance;Sit to/from stand   Upper Body Dressing : Maximal assistance;Sitting   Lower Body Dressing: Total assistance;Sit to/from stand   Toilet Transfer: Moderate assistance;+2 for physical assistance;+2 for safety/equipment;Stand-pivot           Functional mobility during ADLs: Moderate assistance;+2 for physical assistance;+2 for safety/equipment (HHA vs QC)       Vision Patient Visual Report: No change from baseline (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) Vision Assessment?: No apparent visual deficits Additional Comments: not formally assessed     Perception         Praxis         Pertinent Vitals/Pain Pain Assessment Pain Assessment: Faces Faces Pain Scale: Hurts even more Pain Location: R shoulder, RLE with transfers Pain Descriptors / Indicators: Sore, Grimacing, Guarding Pain Intervention(s): Limited activity within patient's tolerance, Monitored during session     Extremity/Trunk Assessment Upper Extremity Assessment Upper Extremity Assessment: Right hand dominant;RUE deficits/detail RUE Deficits / Details: pt with limited AROM of elbow flexion at time fo eval; MD entering room after transfer, so will return for further education regarding elbow/wrist/hand exercises RUE Coordination: decreased fine motor;decreased gross motor   Lower Extremity Assessment Lower Extremity Assessment: Defer to PT evaluation   Cervical / Trunk Assessment Cervical / Trunk Assessment: Kyphotic   Communication Communication Communication: No apparent difficulties   Cognition Arousal: Alert Behavior During Therapy: WFL for tasks assessed/performed Cognition: No family/caregiver present to determine baseline             OT - Cognition Comments: needs redirection from being internally distracted and reports of  comorbidities to sustain attention to new education and tasks                 Following commands: Impaired Following commands impaired: Follows one step commands with increased time, Follows multi-step commands inconsistently     Cueing  General Comments   Cueing Techniques: Verbal cues;Tactile cues  discussed options for safety in future such as life alert and consideration for apartment or housing on the first floor   Exercises Exercises:  (initiated education regarding pendulums, NWB precautions, and restrictions to ROM, but pt with poor carryover and doctor in room at end of session, with OT to come back later today)   Shoulder Instructions      Home Living Family/patient expects to be discharged to:: Private residence Living Arrangements: Alone Available Help at Discharge: Friend(s);Available PRN/intermittently (when friends not at work) Type of Home: Apartment Home Access: Stairs to enter [employer] of Steps: 17+7+8 (on third floor) Entrance Stairs-Rails: Right;Left;Can reach both Home Layout: One level     Bathroom Shower/Tub: Chief Strategy Officer:  (comfort?)     Home Equipment: Grab bars - tub/shower;Rolling Walker (2 wheels)          Prior Functioning/Environment Prior Level of Function : Independent/Modified Independent (within her apartment)             Mobility Comments: lumbar stenosis and sciatica at baseline; RW ADLs Comments: friends usually assist with getting groceries    OT Problem List: Decreased strength;Decreased range of motion;Decreased activity tolerance;Impaired balance (sitting and/or standing);Decreased coordination;Decreased cognition;Decreased safety awareness;Decreased knowledge of use of DME or AE;Decreased knowledge of precautions;Pain;Impaired UE functional use   OT Treatment/Interventions:  Self-care/ADL training;Therapeutic exercise;DME and/or AE instruction;Therapeutic activities;Balance  training;Patient/family education      OT Goals(Current goals can be found in the care plan section)   Acute Rehab OT Goals Patient Stated Goal: get better OT Goal Formulation: With patient Time For Goal Achievement: 12/24/23 Potential to Achieve Goals: Good   OT Frequency:  Min 2X/week    Co-evaluation PT/OT/SLP Co-Evaluation/Treatment: Yes Reason for Co-Treatment: For patient/therapist safety;To address functional/ADL transfers PT goals addressed during session: Mobility/safety with mobility;Proper use of DME OT goals addressed during session: ADL's and self-care;Strengthening/ROM;Proper use of Adaptive equipment and DME      AM-PAC OT 6 Clicks Daily Activity     Outcome Measure Help from another person eating meals?: A Little Help from another person taking care of personal grooming?: A Little Help from another person toileting, which includes using toliet, bedpan, or urinal?: A Lot Help from another person bathing (including washing, rinsing, drying)?: A Lot Help from another person to put on and taking off regular upper body clothing?: A Lot Help from another person to put on and taking off regular lower body clothing?: Total 6 Click Score: 13   End of Session Equipment Utilized During Treatment: Gait belt Nurse Communication: Mobility status  Activity Tolerance: Patient tolerated treatment well Patient left: in chair;with call bell/phone within reach;with chair alarm set  OT Visit Diagnosis: Unsteadiness on feet (R26.81);Muscle weakness (generalized) (M62.81);Other symptoms and signs involving cognitive function;Pain Pain - Right/Left: Right Pain - part of body: Shoulder                Time: 9149-9062 OT Time Calculation (min): 47 min Charges:  OT General Charges $OT Visit: 1 Visit OT Evaluation $OT Eval Moderate Complexity: 1 Mod OT Treatments $Self Care/Home Management : 8-22 mins  Elma JONETTA Lebron FREDERICK, OTR/L [facility] Acute Rehabilitation Office:  [phone]   Elma JONETTA Lebron 12/10/2023, 10:56 AM

## 2023-12-10 NOTE — Anesthesia Postprocedure Evaluation (Signed)
 Anesthesia Post Note  Patient: Jennifer [name]  Procedure(s) Performed: OPEN REDUCTION INTERNAL FIXATION (ORIF) HUMERAL SHAFT FRACTURE (Right)     Patient location during evaluation: PACU Anesthesia Type: General Level of consciousness: awake and alert Pain management: pain level controlled Vital Signs Assessment: post-procedure vital signs reviewed and stable Respiratory status: spontaneous breathing, nonlabored ventilation, respiratory function stable and patient connected to nasal cannula oxygen Cardiovascular status: blood pressure returned to baseline and stable Postop Assessment: no apparent nausea or vomiting Anesthetic complications: no   No notable events documented.  Last Vitals:  Vitals:   12/10/23 0413 12/10/23 0717  BP: (!) 151/51 (!) 138/48  Pulse: (!) 56 (!) 55  Resp: 18   Temp: 36.6 C 36.6 C  SpO2: 99% 99%    Last Pain:  Vitals:   12/10/23 0821  TempSrc:   PainSc: 6                  Lynwood MARLA Cornea

## 2023-12-10 NOTE — Progress Notes (Signed)
 Occupational Therapy Treatment Patient Details Name: Jennifer [name] MRN: [id number] DOB: [birth year] Today's Date: 12/10/2023   History of present illness Pt is a 78 y.o. female presenting 9/6 after fall at home on 9/3 resulting in R arm pain, nausea. Found to have L surgical neck proximal humerus fx, comminuted humeral shaft fx; s/p ORIF 9/8. Also found to have R chest wall hematoma. PMH: aortic stenosis, atrial flutter, BPPV, CAD, fibromyalgia, GERD, HTN, LBB, OSA.   OT comments  Pt seen for second session for R UE HEP education. On BSC on arrival and needing up to mod A+2 for transfer and max-total A for pericare this session. Pt educated regarding elbow/wrist/hand exercises. Pt needing min redirection and cues for sequencing/organization with handout with 4 exercises to count all exercises as separate and perform accordingly. Initiated PROM of shoulder per protocol, but pt not yet ready to perform these exercises without supervision.       If plan is discharge home, recommend the following:  Two people to help with walking and/or transfers;A lot of help with bathing/dressing/bathroom;Two people to help with bathing/dressing/bathroom;Assistance with cooking/housework;Assist for transportation;Help with stairs or ramp for entrance   Equipment Recommendations  Other (comment) (defer)    Recommendations for Other Services Rehab consult    Precautions / Restrictions Precautions Precautions: Shoulder Type of Shoulder Precautions: Sling: For comfort and sleep. Non weight bearing: Yes AROM elbow, wrist and hand to tolerance: Yes. OK for pendulums: Yes. Forward Flexion: 0-90 Abduction: 0-60 External Rotation: 0-30 AROM of shoulder: No. OT Consult Special Instructions: no active shoulder abduction Shoulder Interventions: For comfort;Shoulder sling/immobilizer (continues with no shoulder sling at time of treat; er RN, ortho tech placed this afternoon) Precaution Booklet Issued: Yes (comment) Recall  of Precautions/Restrictions: Impaired Precaution/Restrictions Comments: needing redirection to focus on new learning topics Restrictions Weight Bearing Restrictions Per Provider Order: Yes RUE Weight Bearing Per Provider Order: Non weight bearing       Mobility Bed Mobility Overal bed mobility: Needs Assistance Bed Mobility: Supine to Sit     Supine to sit: Mod assist     General bed mobility comments: cues for technique with HOB elevated. significantly increased time to scoot toward EOB    Transfers Overall transfer level: Needs assistance Equipment used: 1 person hand held assist Transfers: Sit to/from Stand, Bed to chair/wheelchair/BSC Sit to Stand: Mod assist, +2 physical assistance, +2 safety/equipment     Step pivot transfers: Min assist, +2 physical assistance, +2 safety/equipment, From elevated surface     General transfer comment: mod A +2 for rise from EOB x2 with pt with significant retropulsion on first attempt; cues for LE placmeent and hand placement during both attempts. on second attempt, greater forward weight shift with pt able to then take steps to chair with 1 person HHA and +2 hands on asisst moving toward her R     Balance Overall balance assessment: Needs assistance Sitting-balance support: No upper extremity supported, Feet supported Sitting balance-Leahy Scale: Fair Sitting balance - Comments: posterior bias Postural control: Posterior lean Standing balance support: Single extremity supported, During functional activity Standing balance-Leahy Scale: Poor                             ADL either performed or assessed with clinical judgement   ADL Overall ADL's : Needs assistance/impaired  Toileting- Clothing Manipulation and Hygiene: Total assistance Toileting - Clothing Manipulation Details (indicate cue type and reason): for posterior pericare       General ADL Comments: session focused on  UE HEP    Extremity/Trunk Assessment Upper Extremity Assessment Upper Extremity Assessment: Generalized weakness;RUE deficits/detail RUE Deficits / Details: Pt lacks ~1/2 CM to [facility] with composite flexion of digits noting edema to hand and lower arm in addition to shoulder. wrist flexion/extension WFL, decreased elbow extension/flexion ~10-110 AAROM with OT facilitation. lacks supination past neutral RUE Sensation:  (pins and needles at shoulder) RUE Coordination: decreased fine motor;decreased gross motor   Lower Extremity Assessment Lower Extremity Assessment: Defer to PT evaluation        Vision   Vision Assessment?: No apparent visual deficits   Perception     Praxis     Communication Communication Communication: No apparent difficulties   Cognition Arousal: Alert Behavior During Therapy: WFL for tasks assessed/performed Cognition: Cognition impaired     Awareness: Intellectual awareness intact Memory impairment (select all impairments): Short-term memory, Working memory Attention impairment (select first level of impairment): Sustained attention, Selective attention Executive functioning impairment (select all impairments): Organization, Sequencing OT - Cognition Comments: needs redirection from being internally distracted and reports of comorbidities to sustain attention to new education and tasks                 Following commands: Impaired Following commands impaired: Follows one step commands with increased time, Follows multi-step commands inconsistently      Cueing   Cueing Techniques: Verbal cues, Tactile cues  Exercises      Shoulder Instructions       General Comments      Pertinent Vitals/ Pain       Pain Assessment Pain Assessment: Faces Faces Pain Scale: Hurts even more Pain Location: R shoulder, RLE with transfers Pain Descriptors / Indicators: Sore, Grimacing, Guarding Pain Intervention(s): Limited activity within patient's tolerance,  Monitored during session  Home Living                                          Prior Functioning/Environment              Frequency  Min 2X/week        Progress Toward Goals  OT Goals(current goals can now be found in the care plan section)  Progress towards OT goals: Progressing toward goals  Acute Rehab OT Goals Patient Stated Goal: get better OT Goal Formulation: With patient Time For Goal Achievement: 12/24/23 Potential to Achieve Goals: Good  Plan      Co-evaluation    PT/OT/SLP Co-Evaluation/Treatment:  (collaborated with MS to transfer from [facility] to chair)            AM-PAC OT 6 Clicks Daily Activity     Outcome Measure   Help from another person eating meals?: A Little Help from another person taking care of personal grooming?: A Little Help from another person toileting, which includes using toliet, bedpan, or urinal?: A Lot Help from another person bathing (including washing, rinsing, drying)?: A Lot Help from another person to put on and taking off regular upper body clothing?: A Lot Help from another person to put on and taking off regular lower body clothing?: Total 6 Click Score: 13    End of Session Equipment Utilized During Treatment: Gait belt  OT Visit Diagnosis:  Unsteadiness on feet (R26.81);Muscle weakness (generalized) (M62.81);Other symptoms and signs involving cognitive function;Pain Pain - Right/Left: Right Pain - part of body: Shoulder   Activity Tolerance Patient tolerated treatment well   Patient Left in chair;with call bell/phone within reach;with chair alarm set (R UE supported on pillows)   Nurse Communication Mobility status        Time: 8941-8870 OT Time Calculation (min): 31 min  Charges: OT General Charges $OT Visit: 1 Visit OT Treatments $Self Care/Home Management : 8-22 mins $Therapeutic Exercise: 8-22 mins  Elma JONETTA Penner, OTD, OTR/L [facility] Acute Rehabilitation Office:  [phone]   Elma JONETTA Penner 12/10/2023, 4:11 PM

## 2023-12-10 NOTE — Progress Notes (Signed)
   12/10/23 1600  Spiritual Encounters  Type of Visit Initial  Care provided to: Patient  Reason for visit Advance directives  OnCall Visit No  Interventions  Spiritual Care Interventions Made Established relationship of care and support;Compassionate presence;Reflective listening;Normalization of emotions;Encouragement;Prayer;Meaning making  Intervention Outcomes  Outcomes Connection to spiritual care;Awareness of support;Awareness of health;Autonomy/agency;Reduced isolation   Chaplain responded to spiritual consult for Advanced Directives and prayer. Chaplain arrived in room and delivered ACD education. Instructed Pt how to reach out to Spiritual Care if they have any questions and to contact us  when they are ready to move forward.  Chaplain complied with prayer as requested.    Chaplain services remain available as the need arises by Spiritual Consult or for emergent cases, paging [phone].

## 2023-12-10 NOTE — Care Management Important Message (Signed)
 Important Message  Patient Details  Name: CASHLYN HUGULEY MRN: [id number] Date of Birth: [birth year]   Important Message Given:  Yes - Medicare IM     Jon Cruel 12/10/2023, 3:54 PM

## 2023-12-11 ENCOUNTER — Inpatient Hospital Stay: Admitting: Hematology and Oncology

## 2023-12-11 ENCOUNTER — Encounter (HOSPITAL_COMMUNITY): Payer: Self-pay | Admitting: Orthopedic Surgery

## 2023-12-11 ENCOUNTER — Inpatient Hospital Stay

## 2023-12-11 DIAGNOSIS — I480 Paroxysmal atrial fibrillation: Secondary | ICD-10-CM | POA: Diagnosis not present

## 2023-12-11 DIAGNOSIS — I10 Essential (primary) hypertension: Secondary | ICD-10-CM

## 2023-12-11 DIAGNOSIS — S42201D Unspecified fracture of upper end of right humerus, subsequent encounter for fracture with routine healing: Secondary | ICD-10-CM | POA: Diagnosis not present

## 2023-12-11 DIAGNOSIS — N3010 Interstitial cystitis (chronic) without hematuria: Secondary | ICD-10-CM | POA: Diagnosis not present

## 2023-12-11 DIAGNOSIS — E669 Obesity, unspecified: Secondary | ICD-10-CM

## 2023-12-11 LAB — CBC
HCT: 27.4 % — ABNORMAL LOW (ref 36.0–46.0)
Hemoglobin: 8.7 g/dL — ABNORMAL LOW (ref 12.0–15.0)
MCH: 28.6 pg (ref 26.0–34.0)
MCHC: 31.8 g/dL (ref 30.0–36.0)
MCV: 90.1 fL (ref 80.0–100.0)
Platelets: 233 K/uL (ref 150–400)
RBC: 3.04 MIL/uL — ABNORMAL LOW (ref 3.87–5.11)
RDW: 16.3 % — ABNORMAL HIGH (ref 11.5–15.5)
WBC: 8.7 K/uL (ref 4.0–10.5)
nRBC: 0 % (ref 0.0–0.2)

## 2023-12-11 LAB — TYPE AND SCREEN
ABO/RH(D): O POS
Antibody Screen: NEGATIVE
Unit division: 0
Unit division: 0
Unit division: 0
Unit division: 0

## 2023-12-11 LAB — BPAM RBC
Blood Product Expiration Date: 202509282359
Blood Product Expiration Date: 202509252359
Blood Product Expiration Date: 202509252359
Blood Product Unit Number: 202509272359
ISSUE DATE / TIME: 202509081032
ISSUE DATE / TIME: 202509061105
ISSUE DATE / TIME: 202509061710
Unit Type and Rh: 202509272359
Unit Type and Rh: 202509282359
Unit Type and Rh: 5100
Unit Type and Rh: 5100
Unit Type and Rh: 5100
Unit Type and Rh: 5100

## 2023-12-11 MED ORDER — LOSARTAN POTASSIUM 50 MG PO TABS
100.0000 mg | ORAL_TABLET | Freq: Every day | ORAL | Status: DC
  Administered 2023-12-11 – 2023-12-13 (×3): 100 mg via ORAL
  Filled 2023-12-11 (×3): qty 2

## 2023-12-11 MED ORDER — FUROSEMIDE 40 MG PO TABS
40.0000 mg | ORAL_TABLET | Freq: Every day | ORAL | Status: AC
  Administered 2023-12-11: 40 mg via ORAL
  Filled 2023-12-11: qty 1

## 2023-12-11 MED ORDER — SPIRONOLACTONE 25 MG PO TABS
25.0000 mg | ORAL_TABLET | Freq: Every day | ORAL | Status: DC
Start: 2023-12-11 — End: 2023-12-13
  Administered 2023-12-11 – 2023-12-13 (×3): 25 mg via ORAL
  Filled 2023-12-11 (×3): qty 1

## 2023-12-11 NOTE — TOC Initial Note (Addendum)
 Transition of Care [facility]) - Initial/Assessment Note    Patient Details  Name: Jennifer [name] MRN: [id number] Date of Birth: Sep 13, 1945  Transition of Care [facility]) CM/SW Contact:    Bridget Cordella Simmonds, LCSW Phone Number: 12/11/2023, 11:03 AM  Clinical Narrative:     CSW spoke with pt regarding DC plan.  Discussed that [facility] will not be able to accept her, discussed potential DC plan for SNF.  Pt asking for specific reasons why CIR cannot accept, that would be her definite first choice for DC.  Both her parents were in SNF in the past, has lots of SNF experiences both good and not so good.  Pt from home alone, no current services.  Several friends/neighbors listed as contacts, however pt reports she has not spoken with them since her admission.  Permission not given to speak with any contacts at this time. CSW suggested that we start SNF process while waiting on CIR to see her.  Pt does not want to start SNF.  One step at at time.  Message sent to CIR with request to speak with pt.         1255: CSW spoke with pt again.  She has not spoken to CIR, will not move forward with DC planning until she has face to face conversation with them.         1330: CSW spoke with pt in room, she understands why she cannot go to CIR, willing to look at SNF options but requesting only high rated facilities: [facility], [facility], [facility], [facility], [facility].  Referral sent out in hub to these facilities.    Expected Discharge Plan: Skilled Nursing Facility Barriers to Discharge: Continued Medical Work up   Patient Goals and CMS Choice Patient states their goals for this hospitalization and ongoing recovery are:: get back to my apartment          Expected Discharge Plan and Services In-house Referral: Clinical Social Work   Post Acute Care Choice: IP Rehab Living arrangements for the past 2 months: Single Family Home                                      Prior Living  Arrangements/Services Living arrangements for the past 2 months: Single Family Home Lives with:: Self Patient language and need for interpreter reviewed:: Yes Do you feel safe going back to the place where you live?: Yes      Need for Family Participation in Patient Care: Yes (Comment) Care giver support system in place?: Yes (comment) Current home services: Other (comment) (none) Criminal Activity/Legal Involvement Pertinent to Current Situation/Hospitalization: No - Comment as needed  Activities of Daily Living   ADL Screening (condition at time of admission) Independently performs ADLs?: No Does the patient have a NEW difficulty with bathing/dressing/toileting/self-feeding that is expected to last >3 days?: Yes (Initiates electronic notice to provider for possible OT consult) Does the patient have a NEW difficulty with getting in/out of bed, walking, or climbing stairs that is expected to last >3 days?: Yes (Initiates electronic notice to provider for possible PT consult) Does the patient have a NEW difficulty with communication that is expected to last >3 days?: No Is the patient deaf or have difficulty hearing?: No Does the patient have difficulty seeing, even when wearing glasses/contacts?: No Does the patient have difficulty concentrating, remembering, or making decisions?: No  Permission Sought/Granted Permission sought to share  information with : Family Supports Permission granted to share information with : No              Emotional Assessment Appearance:: Appears stated age Attitude/Demeanor/Rapport: Engaged Affect (typically observed): Appropriate Orientation: : Oriented to Self, Oriented to Place, Oriented to  Time, Oriented to Situation      Admission diagnosis:  AKI (acute kidney injury) (HCC) [N17.9] Right humeral fracture [S42.301A] Fall, initial encounter [W19.XXXA] Symptomatic anemia [D64.9] Closed fracture of shaft of right humerus, unspecified fracture  morphology, initial encounter [S42.301A] Patient Active Problem List   Diagnosis Date Noted   Right humeral fracture 12/07/2023   Iron  deficiency anemia 12/28/2022   B12 deficiency 12/28/2022   DOE (dyspnea on exertion) 01/31/2022   Hiatal hernia 08/28/2021   Gastric polyps 08/27/2021   Chronic pain syndrome 08/25/2021   Atrial flutter (HCC) 05/11/2021   Aortic valve disorder 02/13/2021   CAD (coronary artery disease) 02/13/2021   Chronic buttock pain 11/25/2020   Ischemic chest pain (HCC) 09/15/2020   Interstitial cystitis (chronic) without hematuria 09/15/2020   Chronic low back pain 09/15/2020   Chronic diastolic CHF (congestive heart failure) (HCC) 09/15/2020   Heart murmur 09/08/2020   Allergic rhinitis 08/17/2020   Altered bowel function 08/17/2020   Anxiety disorder 08/17/2020   Body mass index (BMI) 35.0-35.9, adult 08/17/2020   Chronic fatigue syndrome 08/17/2020   Colon cancer screening 08/17/2020   Functional diarrhea 08/17/2020   Hypothyroidism 08/17/2020   Intestinal gas excretion 08/17/2020   Localized obesity 08/17/2020   Nausea 08/17/2020   Polypharmacy 08/17/2020   Posttraumatic headache 08/17/2020   Posttraumatic vertigo 08/17/2020   Pure hypercholesterolemia 08/17/2020   Lower abdominal pain 08/17/2020   Scoliosis 08/17/2020   Sleep apnea 08/17/2020   Unspecified abnormal finding in specimens from other organs, systems and tissues 08/17/2020   Vitamin D  deficiency 08/17/2020   Back pain 04/28/2020   Candidiasis 10/15/2019   Encounter for orthopedic follow-up care 08/12/2018   Vertigo 02/26/2018   Degenerative arthritis of right knee 02/19/2018   Urinary tract infection, site not specified 07/04/2017   Arthralgia of both knees 01/23/2017   Facet arthritis, degenerative, lumbar spine 01/23/2017   Benign paroxysmal positional vertigo 07/03/2016   IBS (irritable bowel syndrome) 04/19/2015   GERD (gastroesophageal reflux disease) 03/21/2015    Fibromyalgia 03/21/2015   Recurrent bacterial cystitis 03/21/2015   HTN (hypertension) 03/21/2015   Fibrositis 03/21/2015   SI (sacroiliac) pain 12/16/2014   DDD (degenerative disc disease), lumbar 05/11/2014   Facet arthropathy 05/11/2014   Spinal stenosis of lumbar region 05/11/2014   PCP:  Katina Pfeiffer, PA-C Pharmacy:   [id number] [address] [place], [place] - [address] [place] [address] Phone: [phone] Fax: [phone]  [facility] [address] [place] [address] Phone: [phone] Fax: [phone]     Social Drivers of Health (SDOH) Social History: SDOH Screenings   Food Insecurity: No Food Insecurity (12/07/2023)  Housing: Low Risk  (12/07/2023)  Transportation Needs: No Transportation Needs (12/07/2023)  Utilities: Not At Risk (12/07/2023)  Depression (PHQ2-9): Low Risk  (04/30/2023)  Financial Resource Strain: Low Risk  (03/22/2022)  Social Connections: Moderately Integrated (12/07/2023)  Stress: No Stress Concern Present (03/22/2022)  Tobacco Use: Low Risk  (12/07/2023)   SDOH Interventions:     Readmission Risk Interventions     No data to display

## 2023-12-11 NOTE — [place] FL2 (Signed)
 [place]  MEDICAID FL2 LEVEL OF CARE FORM     IDENTIFICATION  Patient Name: Jennifer [name] Birthdate: 08/04/1945 Sex: female Admission Date (Current Location): 12/06/2023  [facility] and [place] Number:  Producer, television/film/video and Address:  [facility], [address] [place], [place] [address]      Provider Number: [id number]  Attending Physician Name and Address:  Darci Pore, MD  Relative Name and Phone Number:  Ezzard Luke Benne [phone]    Current Level of Care: Hospital Recommended Level of Care: Skilled Nursing Facility Prior Approval Number:    Date Approved/Denied:   PASRR Number: [id number]  Discharge Plan: SNF    Current Diagnoses: Patient Active Problem List   Diagnosis Date Noted   Right humeral fracture 12/07/2023   Iron  deficiency anemia 12/28/2022   B12 deficiency 12/28/2022   DOE (dyspnea on exertion) 01/31/2022   Hiatal hernia 08/28/2021   Gastric polyps 08/27/2021   Chronic pain syndrome 08/25/2021   Atrial flutter (HCC) 05/11/2021   Aortic valve disorder 02/13/2021   CAD (coronary artery disease) 02/13/2021   Chronic buttock pain 11/25/2020   Ischemic chest pain (HCC) 09/15/2020   Interstitial cystitis (chronic) without hematuria 09/15/2020   Chronic low back pain 09/15/2020   Chronic diastolic CHF (congestive heart failure) (HCC) 09/15/2020   Heart murmur 09/08/2020   Allergic rhinitis 08/17/2020   Altered bowel function 08/17/2020   Anxiety disorder 08/17/2020   Body mass index (BMI) 35.0-35.9, adult 08/17/2020   Chronic fatigue syndrome 08/17/2020   Colon cancer screening 08/17/2020   Functional diarrhea 08/17/2020   Hypothyroidism 08/17/2020   Intestinal gas excretion 08/17/2020   Localized obesity 08/17/2020   Nausea 08/17/2020   Polypharmacy 08/17/2020   Posttraumatic headache 08/17/2020   Posttraumatic vertigo 08/17/2020   Pure hypercholesterolemia 08/17/2020   Lower abdominal pain 08/17/2020    Scoliosis 08/17/2020   Sleep apnea 08/17/2020   Unspecified abnormal finding in specimens from other organs, systems and tissues 08/17/2020   Vitamin D  deficiency 08/17/2020   Back pain 04/28/2020   Candidiasis 10/15/2019   Encounter for orthopedic follow-up care 08/12/2018   Vertigo 02/26/2018   Degenerative arthritis of right knee 02/19/2018   Urinary tract infection, site not specified 07/04/2017   Arthralgia of both knees 01/23/2017   Facet arthritis, degenerative, lumbar spine 01/23/2017   Benign paroxysmal positional vertigo 07/03/2016   IBS (irritable bowel syndrome) 04/19/2015   GERD (gastroesophageal reflux disease) 03/21/2015   Fibromyalgia 03/21/2015   Recurrent bacterial cystitis 03/21/2015   HTN (hypertension) 03/21/2015   Fibrositis 03/21/2015   SI (sacroiliac) pain 12/16/2014   DDD (degenerative disc disease), lumbar 05/11/2014   Facet arthropathy 05/11/2014   Spinal stenosis of lumbar region 05/11/2014    Orientation RESPIRATION BLADDER Height & Weight     Self, Time, Situation, Place  Normal Indwelling catheter (chronic cystitis she has to self catheterize) Weight: 212 lb (96.2 kg) Height:  5' 2.5 (158.8 cm)  BEHAVIORAL SYMPTOMS/MOOD NEUROLOGICAL BOWEL NUTRITION STATUS      Continent Diet (see discharge summary)  AMBULATORY STATUS COMMUNICATION OF NEEDS Skin   Extensive Assist Verbally Surgical wounds, Other (Comment) (redness, ecchymosis)                       Personal Care Assistance Level of Assistance  Bathing, Feeding, Dressing, Total care Bathing Assistance: Maximum assistance Feeding assistance: Limited assistance Dressing Assistance: Maximum assistance Total Care Assistance: Maximum assistance   Functional Limitations Info  Sight, Hearing, Speech  Sight Info: Adequate Hearing Info: Adequate Speech Info: Adequate    SPECIAL CARE FACTORS FREQUENCY  PT (By licensed PT), OT (By licensed OT)     PT Frequency: 5x week OT Frequency: 5x  week            Contractures Contractures Info: Not present    Additional Factors Info  Code Status, Allergies Code Status Info: full Allergies Info: Codeine  Dimethyl Sulfoxide  Macrodantin  (Nitrofurantoin  Macrocrystal)  Moxifloxacin  Nitrofurantoin   Quinolones  Sulfa Antibiotics  Tape  Topiramate  Furosemide   Duloxetine  Duloxetine Hcl  Ranexa  (Ranolazine )  Robaxin (Methocarbamol)           Current Medications (12/11/2023):  This is the current hospital active medication list Current Facility-Administered Medications  Medication Dose Route Frequency Provider Last Rate Last Admin   0.9 %  sodium chloride  infusion  10 mL/hr Intravenous Once Deward Eck, PA-C       acetaminophen  (TYLENOL ) tablet 650 mg  650 mg Oral Q6H PRN Deward Eck, PA-C       apixaban  (ELIQUIS ) tablet 5 mg  5 mg Oral BID Gretel Prentice BIRCH, RPH   5 mg at 12/11/23 1000   calcium  carbonate (TUMS - dosed in mg elemental calcium ) chewable tablet 200 mg of elemental calcium   1 tablet Oral TID WC Deward Eck, PA-C   200 mg of elemental calcium  at 12/11/23 1234   cephALEXin  (KEFLEX ) capsule 500 mg  500 mg Oral Q8H Akula, Vijaya, MD   500 mg at 12/11/23 1438   dextromethorphan -guaiFENesin  (MUCINEX  DM) 30-600 MG per 12 hr tablet 1 tablet  1 tablet Oral BID PRN Deward Eck, PA-C       docusate sodium  (COLACE) capsule 100 mg  100 mg Oral BID Deward Eck, PA-C   100 mg at 12/11/23 1000   ezetimibe  (ZETIA ) tablet 10 mg  10 mg Oral Daily Deward Eck, PA-C   10 mg at 12/11/23 1000   furosemide  (LASIX ) tablet 40 mg  40 mg Oral Daily Sreeram, Narendranath, MD       gabapentin  (NEURONTIN ) capsule 300 mg  300 mg Oral QHS Deward Eck, PA-C   300 mg at 12/10/23 2033   hydrALAZINE  (APRESOLINE ) tablet 50 mg  50 mg Oral TID Deward Eck, PA-C   50 mg at 12/11/23 1000   HYDROcodone -acetaminophen  (NORCO) 7.5-325 MG per tablet 1 tablet  1 tablet Oral Q6H PRN Deward Eck, PA-C   1 tablet at 12/11/23 0635   isosorbide  dinitrate (ISORDIL ) tablet 10  mg  10 mg Oral TID Deward Eck, PA-C   10 mg at 12/11/23 9040   losartan  (COZAAR ) tablet 100 mg  100 mg Oral Daily Sreeram, Narendranath, MD       menthol -cetylpyridinium (CEPACOL) lozenge 3 mg  1 lozenge Oral PRN Deward Eck, PA-C       Or   phenol (CHLORASEPTIC) mouth spray 1 spray  1 spray Mouth/Throat PRN Deward Eck, PA-C       metoprolol  tartrate (LOPRESSOR ) tablet 25 mg  25 mg Oral BID Deward Eck, PA-C   25 mg at 12/11/23 9040   morphine  (PF) 2 MG/ML injection 2-3 mg  2-3 mg Intravenous Q2H PRN Deward Eck, PA-C   2 mg at 12/10/23 2038   ondansetron  (ZOFRAN ) tablet 4 mg  4 mg Oral Q6H PRN Deward Eck, PA-C       Or   ondansetron  (ZOFRAN ) injection 4 mg  4 mg Intravenous Q6H PRN Deward Eck, PA-C       pantoprazole  (  PROTONIX ) EC tablet 40 mg  40 mg Oral BID Deward Eck, PA-C   40 mg at 12/11/23 1000   phenazopyridine  (PYRIDIUM ) tablet 100 mg  100 mg Oral TID WC PRN Deward Eck, PA-C   100 mg at 12/07/23 1340   rosuvastatin  (CRESTOR ) tablet 5 mg  5 mg Oral Once per day on Monday Wednesday Friday Deward Eck, PA-C       scopolamine  (TRANSDERM-SCOP) 1 MG/3DAYS 1 mg  1 patch Transdermal Q72H Deward Eck, PA-C   1 mg at 12/09/23 9243   sodium chloride  flush (NS) 0.9 % injection 3 mL  3 mL Intravenous Q12H Deward Eck, PA-C   3 mL at 12/11/23 1002   spironolactone  (ALDACTONE ) tablet 25 mg  25 mg Oral Daily Sreeram, Narendranath, MD       sucralfate  (CARAFATE ) tablet 1 g  1 g Oral TID WC & HS Deward Eck, PA-C   1 g at 12/11/23 1234   Vitamin D  (Ergocalciferol ) (DRISDOL ) 1.25 MG (50000 UNIT) capsule 50,000 Units  50,000 Units Oral Q7 days Deward Eck, PA-C   50,000 Units at 12/08/23 1226     Discharge Medications: Please see discharge summary for a list of discharge medications.  Relevant Imaging Results:  Relevant Lab Results:   Additional Information SSN: [id number]  Bridget Cordella Simmonds, LCSW

## 2023-12-11 NOTE — Plan of Care (Signed)

## 2023-12-11 NOTE — Progress Notes (Signed)
   Inpatient Rehabilitation Admissions Coordinator   I met with patient at bedside and reviewed why she is not a candidate for [facility] admit. She does not have the medical neccesity for hospital level rehab and  she has no assistance long  term which she will need . I recommended SNF and possible longterm in her future ALF. I have updated acute team and TOC.  Heron Leavell, RN, MSN Rehab Admissions Coordinator [phone] 12/11/2023 3:01 PM

## 2023-12-11 NOTE — Progress Notes (Signed)
 Progress Note   Patient: Jennifer [name] FMW:[id number] DOB: 28-Jul-1945 DOA: 12/06/2023     4 DOS: the patient was seen and examined on 12/11/2023   Brief hospital course: Jennifer [name] is a 78 y.o. 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 She had severely displaced and comminuted fracture involving the proximal right humeral shaft which extends proximally into the proximal right humeral head and neck. Patient admitted to [facility] service, orthopedic surgery consulted.  She had ORIF 12/09/23, awaiting SNF.  Assessment and Plan: Comminuted fracture involving the proximal right humerus shaft Orthopedics on board.  S/p ORIF of the right humerus 12/09/23 Pain control, encourage out of bed. Resumed Eliquis  12/10/23. PT/ OT follow up. She does not want to go SNF, requesting CIR which is denied previously.   Vitamin D  deficiency Replaced. Recheck in 6 months.    Acute blood loss anemia in the setting of eliquis  use S/p 2 units of prbc transfusion.  Hemoglobin stable around 8.8.  Monitor h/h, transfuse for Hb<8.  Anemia panel wnl.    Atrial fibrillation Rate controlled on BB. Continue Eliquis .    Chronic interstitial cystitis, in the setting of chronic ESBL Ecoli, MDR klebsiella  UA abnormal, she reports some burning while placing a purewick.  She self caths .  She follows up with urology outpatient.  Urine cultures growing klebsiella sensitive to keflex  started 9/8.     Severe AS HFpEF Continue metoprolol , isordil  and  hydralazine .  Resumed Losartan , aldactone . Lasix  one time 40mg  oral ordered, she takes as needed.    Hypertension Controlled on current regimen.    Hypothyroidism: Continue synthroid.  Obesity Class II- Diet, exercise and weight reduction advised.     Out of bed to chair. Incentive spirometry. Nursing supportive care. Fall, aspiration precautions. Diet:  Diet Orders (From admission, onward)     Start     Ordered   12/09/23 1308  Diet regular Room service appropriate? Yes; Fluid consistency: Thin  Diet effective now       Question Answer Comment  Room service appropriate? Yes   Fluid consistency: Thin      12/09/23 1308           DVT prophylaxis: SCD's Start: 12/09/23 1308 SCDs Start: 12/07/23 1003 apixaban  (ELIQUIS ) tablet 5 mg  Level of care: Telemetry Medical   Code Status: Full Code  Subjective: Patient is seen and examined today morning. She is lying in bed. States she wishes to go CIR, does not want SNF placement. She lives alone. No other complaints.  Physical Exam: Vitals:   12/10/23 2108 12/11/23 0600 12/11/23 0832 12/11/23 1440  BP: (!) 133/37 (!) 174/73 (!) 155/49 (!) 165/48  Pulse: (!) 54 63 61 61  Resp: 16     Temp: 97.7 F (36.5 C)  99 F (37.2 C) 98.3 F (36.8 C)  TempSrc: Oral  Oral Oral  SpO2: 98%  96% 98%  Weight:      Height:        General - Elderly Caucasian female, no apparent distress HEENT - PERRLA, EOMI, bruises over fore head, eyes, non tender sinuses. Lung - Clear, basal  rales, rhonchi, wheezes. Bruises over chest noted Heart - S1, S2 heard, no murmurs, rubs, trace pedal edema. Abdomen - Soft, non tender, bowel sounds good Neuro - Alert, awake and oriented x 3, non focal exam. Skin - Warm and dry.  Data Reviewed:      Latest Ref Rng & Units 12/11/2023    3:55 AM 12/10/2023    5:19 AM 12/09/2023    2:26 PM  CBC  WBC 4.0 - 10.5 K/uL 8.7  10.2  10.2   Hemoglobin 12.0 - 15.0 g/dL 8.7  8.8  89.8   Hematocrit 36.0 - 46.0 %  27.4  27.2  31.3   Platelets 150 - 400 K/uL 233  227  207       Latest Ref Rng & Units 12/10/2023    5:19 AM 12/08/2023    4:05 AM 12/06/2023   10:12 PM  BMP  Glucose 70 - 99 mg/dL 877  98  878   BUN 8 - 23 mg/dL 15  21  36   Creatinine 0.44 - 1.00 mg/dL 9.11  9.08  8.49   Sodium 135 - 145 mmol/L 138  138  138   Potassium 3.5 - 5.1 mmol/L 4.1  3.8  4.4   Chloride 98 - 111 mmol/L 106  104  101   CO2 22 - 32 mmol/L 25  26  24    Calcium  8.9 - 10.3 mg/dL 8.1  8.1  8.7    No results found.  Family Communication: Discussed with patient, understand and agree. All questions answered.  Disposition: Status is: Inpatient Remains inpatient appropriate because: pain control, PT/ OT, SNF placement  Planned Discharge Destination: Skilled nursing facility     Time spent: 45 minutes  Author: Concepcion Riser, MD 12/11/2023 3:30 PM Secure chat 7am to 7pm For on call review [email or web address].

## 2023-12-11 NOTE — Progress Notes (Signed)
 Physical Therapy Treatment Patient Details Name: Jennifer [name] MRN: [id number] DOB: 08/09/1945 Today's Date: 12/11/2023   History of Present Illness Pt is a 78 y.o. female presenting 9/6 after fall at home on 9/3 resulting in R arm pain, nausea. Found to have L surgical neck proximal humerus fx, comminuted humeral shaft fx; s/p ORIF 9/8. Also found to have R chest wall hematoma. PMH: aortic stenosis, atrial flutter, BPPV, CAD, fibromyalgia, GERD, HTN, LBB, OSA.    PT Comments  The pt is making gradual progress as she no longer displays a posterior bias or knee buckling, but she does fatigue quickly when standing and ambulating. She was able to ambulate up to ~20 ft with L HHA and min-modA for balance today. She needed cues and minA to sequence and perform bed mobility and transfers while maintaining NWB through her R UE. Extra time spent actively listening and encouraging pt due to pt expressing anxiety and feeling down in regards to her not qualifying for AIR and recs to go to a SNF for rehab. Will continue to follow acutely.    If plan is discharge home, recommend the following: Two people to help with walking and/or transfers;A lot of help with bathing/dressing/bathroom;Assistance with cooking/housework;Assist for transportation;Help with stairs or ramp for entrance   Can travel by private vehicle     Yes  Equipment Recommendations  Wheelchair (measurements PT);Wheelchair cushion (measurements PT);BSC/3in1;Cane;Hospital bed    Recommendations for Other Services       Precautions / Restrictions Precautions Precautions: Shoulder;Fall Type of Shoulder Precautions: Sling: For comfort and sleep. Non weight bearing: Yes AROM elbow, wrist and hand to tolerance: Yes. OK for pendulums: Yes. Forward Flexion: 0-90 Abduction: 0-60 External Rotation: 0-30 AROM of shoulder: No. OT Consult Special Instructions: no active shoulder abduction Shoulder Interventions: For comfort;Shoulder  sling/immobilizer Precaution Booklet Issued: Yes (comment) Recall of Precautions/Restrictions: Impaired Restrictions Weight Bearing Restrictions Per Provider Order: Yes RUE Weight Bearing Per Provider Order: Non weight bearing     Mobility  Bed Mobility Overal bed mobility: Needs Assistance Bed Mobility: Rolling, Sidelying to Sit, Sit to Supine Rolling: Min assist Sidelying to sit: Min assist, HOB elevated   Sit to supine: Min assist, HOB elevated   General bed mobility comments: Cues needed to roll onto L elbow to push up on bed to ascend trunk to sit up L EOB from elevated HOB, minA needed at trunk throughout. MinA needed at legs to lift back onto bed with return to supine.    Transfers Overall transfer level: Needs assistance Equipment used: 1 person hand held assist Transfers: Sit to/from Stand Sit to Stand: Min assist           General transfer comment: Pt able to power up to stand and gain balance from sitting EOB through being provided L HHA minA.    Ambulation/Gait Ambulation/Gait assistance: Min assist, Mod assist Gait Distance (Feet): 20 Feet Assistive device: 1 person hand held assist Gait Pattern/deviations: Step-through pattern, Decreased step length - right, Decreased step length - left, Decreased stride length Gait velocity: reduced Gait velocity interpretation: <1.31 ft/sec, indicative of household ambulator   General Gait Details: Pt takes slow, small steps with L HHA min-modA for balance. Pt limited in distance by fatigue. No knee buckling noted.   Stairs             Wheelchair Mobility     Tilt Bed    Modified Rankin (Stroke Patients Only)       Balance Overall balance  assessment: Needs assistance Sitting-balance support: No upper extremity supported, Feet supported Sitting balance-Leahy Scale: Fair Sitting balance - Comments: static sitting with supervision for safety   Standing balance support: Single extremity supported, During  functional activity Standing balance-Leahy Scale: Poor Standing balance comment: L HHA with min-modA to ambulate and stand                            Communication Communication Communication: No apparent difficulties  Cognition Arousal: Alert Behavior During Therapy: Anxious   PT - Cognitive impairments: Attention                       PT - Cognition Comments: pt very verbose and emotional about her medical problems and limitations along with her feelings about being upset she did not qualify for AIR and is now going to a SNF. Pt needing active listening, encouragement, and intermittent redirection to task. Following commands: Impaired Following commands impaired: Follows one step commands with increased time    Cueing Cueing Techniques: Verbal cues, Tactile cues  Exercises      General Comments General comments (skin integrity, edema, etc.): Increased time providing active listening and encouragement with pt's fears/anxiety about d/c options      Pertinent Vitals/Pain Pain Assessment Pain Assessment: Faces Faces Pain Scale: Hurts even more Pain Location: R shoulder Pain Descriptors / Indicators: Sore, Grimacing, Guarding, Discomfort, Operative site guarding Pain Intervention(s): Limited activity within patient's tolerance, Monitored during session, Repositioned    Home Living                          Prior Function            PT Goals (current goals can now be found in the care plan section) Acute Rehab PT Goals Patient Stated Goal: to remain independent and return home after rehab PT Goal Formulation: With patient Time For Goal Achievement: 12/24/23 Potential to Achieve Goals: Good Progress towards PT goals: Progressing toward goals    Frequency    Min 3X/week      PT Plan      Co-evaluation              AM-PAC PT 6 Clicks Mobility   Outcome Measure  Help needed turning from your back to your side while in a flat  bed without using bedrails?: A Little Help needed moving from lying on your back to sitting on the side of a flat bed without using bedrails?: A Little Help needed moving to and from a bed to a chair (including a wheelchair)?: A Little Help needed standing up from a chair using your arms (e.g., wheelchair or bedside chair)?: A Little Help needed to walk in hospital room?: A Lot Help needed climbing 3-5 steps with a railing? : Total 6 Click Score: 15    End of Session Equipment Utilized During Treatment: Gait belt;Other (comment) (sling) Activity Tolerance: Patient limited by pain;Patient limited by fatigue Patient left: with call bell/phone within reach;in bed;with bed alarm set Nurse Communication: Other (comment) (notified RN and MD of pt's request for lasix ) PT Visit Diagnosis: Unsteadiness on feet (R26.81);History of falling (Z91.81);Difficulty in walking, not elsewhere classified (R26.2);Pain;Muscle weakness (generalized) (M62.81);Repeated falls (R29.6);Other abnormalities of gait and mobility (R26.89) Pain - Right/Left: Right Pain - part of body: Shoulder     Time: 1510-1546 PT Time Calculation (min) (ACUTE ONLY): 36 min  Charges:    $  Therapeutic Activity: 23-37 mins PT General Charges $$ ACUTE PT VISIT: 1 Visit                     Theo Ferretti, PT, DPT Acute Rehabilitation Services  Office: [phone]    Theo CHRISTELLA Ferretti 12/11/2023, 5:51 PM

## 2023-12-12 DIAGNOSIS — I480 Paroxysmal atrial fibrillation: Secondary | ICD-10-CM | POA: Diagnosis not present

## 2023-12-12 DIAGNOSIS — I10 Essential (primary) hypertension: Secondary | ICD-10-CM | POA: Diagnosis not present

## 2023-12-12 DIAGNOSIS — N3010 Interstitial cystitis (chronic) without hematuria: Secondary | ICD-10-CM | POA: Diagnosis not present

## 2023-12-12 DIAGNOSIS — S42201D Unspecified fracture of upper end of right humerus, subsequent encounter for fracture with routine healing: Secondary | ICD-10-CM | POA: Diagnosis not present

## 2023-12-12 DIAGNOSIS — E669 Obesity, unspecified: Secondary | ICD-10-CM | POA: Diagnosis not present

## 2023-12-12 LAB — CBC
HCT: 29.3 % — ABNORMAL LOW (ref 36.0–46.0)
Hemoglobin: 9.3 g/dL — ABNORMAL LOW (ref 12.0–15.0)
MCH: 28.3 pg (ref 26.0–34.0)
MCHC: 31.7 g/dL (ref 30.0–36.0)
MCV: 89.1 fL (ref 80.0–100.0)
Platelets: 268 K/uL (ref 150–400)
RBC: 3.29 MIL/uL — ABNORMAL LOW (ref 3.87–5.11)
RDW: 16.2 % — ABNORMAL HIGH (ref 11.5–15.5)
WBC: 9.3 K/uL (ref 4.0–10.5)
nRBC: 0 % (ref 0.0–0.2)

## 2023-12-12 MED ORDER — GUAIFENESIN 100 MG/5ML PO LIQD
5.0000 mL | ORAL | Status: DC | PRN
  Administered 2023-12-12: 5 mL via ORAL
  Filled 2023-12-12: qty 10

## 2023-12-12 MED ORDER — PREGABALIN 75 MG PO CAPS
75.0000 mg | ORAL_CAPSULE | Freq: Two times a day (BID) | ORAL | Status: DC
  Administered 2023-12-12 – 2023-12-13 (×3): 75 mg via ORAL
  Filled 2023-12-12 (×3): qty 1

## 2023-12-12 MED ORDER — DM-GUAIFENESIN ER 30-600 MG PO TB12
1.0000 | ORAL_TABLET | Freq: Two times a day (BID) | ORAL | Status: DC
  Administered 2023-12-12 – 2023-12-13 (×3): 1 via ORAL
  Filled 2023-12-12 (×4): qty 1

## 2023-12-12 MED ORDER — HYDROCODONE-ACETAMINOPHEN 7.5-325 MG PO TABS: 1.0000 | ORAL_TABLET | Freq: Four times a day (QID) | ORAL | 0 refills | Status: AC | PRN

## 2023-12-12 MED ORDER — VITAMIN D (ERGOCALCIFEROL) 1.25 MG (50000 UNIT) PO CAPS: 50000.0000 [IU] | ORAL_CAPSULE | ORAL | Status: AC

## 2023-12-12 NOTE — Plan of Care (Signed)
  Problem: Education: Goal: Knowledge of General Education information will improve Description: Including pain rating scale, medication(s)/side effects and non-pharmacologic comfort measures Outcome: Progressing   Problem: Clinical Measurements: Goal: Ability to maintain clinical measurements within normal limits will improve Outcome: Progressing Goal: Respiratory complications will improve Outcome: Progressing Goal: Cardiovascular complication will be avoided Outcome: Progressing   Problem: Elimination: Goal: Will not experience complications related to bowel motility Outcome: Progressing Goal: Will not experience complications related to urinary retention Outcome: Progressing   Problem: Pain Management: Goal: Pain level will decrease with appropriate interventions Outcome: Progressing
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
## 2023-12-12 NOTE — Progress Notes (Signed)
 Progress Note   Patient: Jennifer [name] FMW:[id number] DOB: 1945/06/01 DOA: 12/06/2023     5 DOS: the patient was seen and examined on 12/12/2023   Brief hospital course: Jennifer [name] is a 78 y.o. 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 She had severely displaced and comminuted fracture involving the proximal right humeral shaft which extends proximally into the proximal right humeral head and neck. Patient admitted to [facility] service, orthopedic surgery consulted.  She had ORIF 12/09/23, awaiting SNF.  Assessment and Plan: Comminuted fracture involving the proximal right humerus shaft Orthopedics on board.  S/p ORIF of the right humerus 12/09/23 Continue shoulder sling, Pain control, encourage out of bed. Resumed Eliquis  12/10/23. PT/ OT follow up. Awaiting follow up.   Vitamin D  deficiency Replaced. Recheck in 6 months.    Acute blood loss anemia in the setting of eliquis  use S/p 2 units of prbc transfusion.  Hemoglobin stable around 9  Monitor h/h, transfuse for Hb<8.  Anemia panel wnl.    Atrial fibrillation Rate controlled on BB. Continue Eliquis .    Chronic interstitial cystitis, in the setting of chronic ESBL Ecoli, MDR klebsiella  UA abnormal, she reports some burning while placing a purewick.  She self caths.  She follows up with urology outpatient.  Urine cultures growing klebsiella sensitive to keflex  started 9/8.     Severe AS HFpEF- Continue metoprolol , isordil  and hydralazine .  Resumed Losartan ,  aldactone . Got 1 time lasix  yesterday.    Hypertension Controlled on current regimen.    Hypothyroidism: Continue synthroid.  Obesity Class II- Diet, exercise and weight reduction advised.    Out of bed to chair. Incentive spirometry. Nursing supportive care. Fall, aspiration precautions. Diet:  Diet Orders (From admission, onward)     Start     Ordered   12/09/23 1308  Diet regular Room service appropriate? Yes; Fluid consistency: Thin  Diet effective now       Question Answer Comment  Room service appropriate? Yes   Fluid consistency: Thin      12/09/23 1308           DVT prophylaxis: SCD's Start: 12/09/23 1308 SCDs Start: 12/07/23 1003 apixaban  (ELIQUIS ) tablet 5 mg  Level of care: Telemetry Medical   Code Status: Full Code  Subjective: Patient is seen and examined today morning. She is lying in bed. Asked for neuropathy meds, mucinex  dm. Awaiting SNF.  Physical Exam: Vitals:   12/12/23 0412 12/12/23 0737 12/12/23 1514 12/12/23 1952  BP: (!) 141/53 (!) 175/59 (!) 113/47 (!) 128/37  Pulse: (!) 53 (!) 58 61 64  Resp: 16 17 17 17   Temp: 98 F (36.7 C) 98.1 F (36.7 C) (!) 97.4 F (36.3 C) 98.6 F (37 C)  TempSrc: Oral   Oral  SpO2: 94% 100% 99% 97%  Weight:      Height:        General - Elderly Caucasian female, no apparent distress HEENT - PERRLA, EOMI, bruises over fore head, eyes, non tender sinuses. Lung - Clear, basal rales, rhonchi, wheezes. Bruises over chest noted Heart - S1, S2 heard, no murmurs, rubs, trace pedal edema.  Abdomen - Soft, non tender, bowel sounds good Neuro - Alert, awake and oriented x 3, non focal exam. Skin - Warm and dry.  Data Reviewed:      Latest Ref Rng & Units 12/12/2023    5:58 AM 12/11/2023    3:55 AM 12/10/2023    5:19 AM  CBC  WBC 4.0 - 10.5 K/uL 9.3  8.7  10.2   Hemoglobin 12.0 - 15.0 g/dL 9.3  8.7  8.8   Hematocrit 36.0 - 46.0 % 29.3  27.4  27.2   Platelets 150 - 400 K/uL 268  233  227       Latest Ref  Rng & Units 12/10/2023    5:19 AM 12/08/2023    4:05 AM 12/06/2023   10:12 PM  BMP  Glucose 70 - 99 mg/dL 877  98  878   BUN 8 - 23 mg/dL 15  21  36   Creatinine 0.44 - 1.00 mg/dL 9.11  9.08  8.49   Sodium 135 - 145 mmol/L 138  138  138   Potassium 3.5 - 5.1 mmol/L 4.1  3.8  4.4   Chloride 98 - 111 mmol/L 106  104  101   CO2 22 - 32 mmol/L 25  26  24    Calcium  8.9 - 10.3 mg/dL 8.1  8.1  8.7    No results found.  Family Communication: Discussed with patient, understand and agree. All questions answered.  Disposition: Status is: Inpatient Remains inpatient appropriate because: pain control, SNF placement  Planned Discharge Destination: Skilled nursing facility     Time spent: 43 minutes  Author: Concepcion Riser, MD 12/12/2023 9:58 PM Secure chat 7am to 7pm For on call review [email or web address].

## 2023-12-12 NOTE — Progress Notes (Signed)
 Orthopaedic Trauma Service Progress Note  Patient ID: Jennifer [name] MRN: [id number] DOB/AGE: Feb 06, 1946 78 y.o.  Subjective:  Ortho issues stable    ROS  Today's  total administered Morphine  Milligram Equivalents: 7.5 Yesterday's total administered Morphine  Milligram Equivalents: 15  Objective:   VITALS:   Vitals:   12/11/23 1946 12/11/23 2100 12/12/23 0412 12/12/23 0737  BP: (!) 172/53  (!) 141/53 (!) 175/59  Pulse: 64  (!) 53 (!) 58  Resp: 18  16 17   Temp:  100.2 F (37.9 C) 98 F (36.7 C) 98.1 F (36.7 C)  TempSrc:  Oral Oral   SpO2: 98%  94% 100%  Weight:      Height:        Estimated body mass index is 38.16 kg/m as calculated from the following:   Height as of this encounter: 5' 2.5 (1.588 m).   Weight as of this encounter: 96.2 kg.   Intake/Output      09/10 0701 09/11 0700 09/11 0701 09/12 0700   P.O. 120    Total Intake(mL/kg) 120 (1.2)    Urine (mL/kg/hr) 2500 (1.1)    Emesis/NG output     Other     Stool 0    Blood     Total Output 2500    Net -2380         Urine Occurrence 1 x    Stool Occurrence 1 x      LABS  Results for orders placed or performed during the hospital encounter of 12/06/23 (from the past 24 hours)  CBC     Status: Abnormal   Collection Time: 12/12/23  5:58 AM  Result Value Ref Range   WBC 9.3 4.0 - 10.5 K/uL   RBC 3.29 (L) 3.87 - 5.11 MIL/uL   Hemoglobin 9.3 (L) 12.0 - 15.0 g/dL   HCT 70.6 (L) 63.9 - 53.9 %   MCV 89.1 80.0 - 100.0 fL   MCH 28.3 26.0 - 34.0 pg   MCHC 31.7 30.0 - 36.0 g/dL   RDW 83.7 (H) 88.4 - 84.4 %   Platelets 268 150 - 400 K/uL   nRBC 0.0 0.0 - 0.2 %     PHYSICAL EXAM:   Gen: sitting up in bed, looks good, pleasant as always  Lungs: unlabored  Ext:       Right upper extremity              Prevena with good seal   Dressing removed   Incision looks great    No signs of infection    No active drainage     Mepilex applied to incision              Ext warm              Swelling stable             + radial pulse              Radial, ulnar, median nerve motor and sensory functions intact.  AIN and PIN intact             No pain with passive stretching of digits  Assessment/Plan: 3 Days Post-Op   Principal Problem:   Right humeral fracture   Anti-infectives (From admission, onward)    Start  Dose/Rate Route Frequency Ordered Stop   12/09/23 1415  cephALEXin  (KEFLEX ) capsule 500 mg        500 mg Oral Every 8 hours 12/09/23 1324     12/08/23 2200  meropenem  (MERREM ) 1 g in sodium chloride  0.9 % 100 mL IVPB  Status:  Discontinued        1 g 200 mL/hr over 30 Minutes Intravenous Every 8 hours 12/08/23 1337 12/09/23 1323   12/07/23 1202  Urogesic-Blue 81.6 MG TABS 81.6 mg  Status:  Discontinued        1 tablet Oral Every 6 hours PRN 12/07/23 1203 12/07/23 1229   12/07/23 0645  meropenem  (MERREM ) 1 g in sodium chloride  0.9 % 100 mL IVPB  Status:  Discontinued        1 g 200 mL/hr over 30 Minutes Intravenous Every 12 hours 12/07/23 0635 12/08/23 1337     .  POD/HD#: 8  78 year old female s/p fall with highly comminuted right proximal humerus and humeral shaft fracture   - Fall   -highly comminuted right proximal humerus and humeral shaft fracture s/p ORIF             Nonweightbearing right upper extremity             No lifting with right arm             Unrestricted range of motion of right elbow, forearm wrist and hand             Passive shoulder flexion and extension             Passive shoulder abduction             No active shoulder abduction for approximately 6 weeks               Sling for comfort   Sling should be off when in chair or bed   Sling on when up mobilizing              OT and PT evaluations               Continue with ice for swelling and pain control               Dressing changed today.  New Mepilex applied.  Change as needed.  Okay to clean wound  with soap and water  only              - Pain management:             Multimodal   - ABL anemia/Hemodynamics             CBC looks good today   - Medical issues              Per primary and cardiology   - DVT/PE prophylaxis:             Okay to resume Eliquis  from Ortho standpoint   - ID:              Perioperative antibiotics   - Metabolic Bone Disease:             Vitamin d  insufficiency                          Supplement    - Activity:             As above   - FEN/GI prophylaxis/Foley/Lines:  Diet as tolerated    - Impediments to fracture healing:             Vitamin d  insufficiency              Fragility fracture    - Dispo:             Ortho issues stable              SNF   Follow up with ortho in 7-10 days   Francis MICAEL Mt, PA-C [phone] (C) 12/12/2023, 9:33 AM  [facility] [address] [place] [address] [phone] (F)    After 5pm and on the weekends please log on to [employer], go to orthopaedics and the look under the [facility] Call for the provider(s) on call. You can also call our office at [phone] and then follow the prompts to be connected to the call team.  Patient ID: Jennifer [name], female   DOB: 03/17/1946, 78 y.o.   MRN: [id number]

## 2023-12-12 NOTE — Discharge Instructions (Addendum)
 [facility] Discharge Instructions   General Discharge Instructions  Orthopaedic Injuries:  Right proximal humerus fracture treated with open reduction and internal fixation using plate and screws  WEIGHT BEARING STATUS: Nonweightbearing right upper extremity.  No lifting more than 5 pounds.  RANGE OF MOTION/ACTIVITY: Shoulder pendulum exercises okay.  Gentle shoulder flexion and extension.  No active shoulder abduction.  Passive shoulder abduction is okay, please limit external rotation to about 30 degrees.  Unrestricted range of motion of elbow, forearm wrist and hand.  Sling for comfort  Bone health: Labs show vitamin D  insufficiency.  Please take 5000 IUs of vitamin D3 daily  Review the following resource for additional information regarding bone health  [email or web address]  Wound Care: Daily wound care as needed.  See below.  Okay to start cleaning wounds with soap and water  only  Discharge Wound Care Instructions  Do NOT apply any ointments, solutions or lotions to pin sites or surgical wounds.  These prevent needed drainage and even though solutions like hydrogen peroxide kill bacteria, they also damage cells lining the pin sites that help fight infection.  Applying lotions or ointments can keep the wounds moist and can cause them to breakdown and open up as well. This can increase the risk for infection. When in doubt call the office.  Surgical incisions should be dressed daily.  If any drainage is noted, use one layer of adaptic or Mepitel, then gauze, and tape.  Alternatively can use a silicone foam dressing such as a Mepilex  [email or web address] [email or web address]  [email or web address]  These dressing supplies should be  available at local medical supply stores ([employer], [employer], etc). They are not usually carried at places like [facility], [facility], [facility], etc  Once the incision is completely dry and without drainage, it may be left open to air out.  Showering may begin 36-48 hours later.  Cleaning gently with soap and water .   DVT/PE prophylaxis: Resume home Eliquis   Diet: as you were eating previously.  Can use over the counter stool softeners and bowel preparations, such as Miralax, to help with bowel movements.  Narcotics can be constipating.  Be sure to drink plenty of fluids  PAIN MEDICATION USE AND EXPECTATIONS  You have likely been given narcotic medications to help control your pain.  After a traumatic event that results in an fracture (broken bone) with or without surgery, it is ok to use narcotic pain medications to help control one's pain.  We understand that everyone responds to pain differently and each individual patient will be evaluated on a regular basis for the continued need for narcotic medications. Ideally, narcotic medication use should last no more than 6-8 weeks (coinciding with fracture healing).   As a patient it is your responsibility as well to monitor narcotic medication use and report the amount and frequency you use these medications when you come to your office visit.   We would also advise that if you are using narcotic medications, you should take a dose prior to therapy to maximize you participation.  IF YOU ARE ON NARCOTIC MEDICATIONS IT IS NOT PERMISSIBLE TO OPERATE A MOTOR VEHICLE (MOTORCYCLE/CAR/TRUCK/MOPED) OR HEAVY MACHINERY DO NOT MIX NARCOTICS WITH OTHER CNS (CENTRAL NERVOUS SYSTEM) DEPRESSANTS SUCH AS ALCOHOL   POST-OPERATIVE OPIOID TAPER INSTRUCTIONS: It is important to wean off of your opioid medication as soon as possible. If you do not need pain medication after your surgery it is ok to stop day one. Opioids include: Codeine, Hydrocodone (Norco,  Vicodin), Oxycodone (Percocet,  oxycontin ) and hydromorphone  amongst others.  Long term and even short term use of opiods can cause: Increased pain response Dependence Constipation Depression Respiratory depression And more.  Withdrawal symptoms can include Flu like symptoms Nausea, vomiting And more Techniques to manage these symptoms Hydrate well Eat regular healthy meals Stay active Use relaxation techniques(deep breathing, meditating, yoga) Do Not substitute Alcohol to help with tapering If you have been on opioids for less than two weeks and do not have pain than it is ok to stop all together.  Plan to wean off of opioids This plan should start within one week post op of your fracture surgery  Maintain the same interval or time between taking each dose and first decrease the dose.  Cut the total daily intake of opioids by one tablet each day Next start to increase the time between doses. The last dose that should be eliminated is the evening dose.    STOP SMOKING OR USING NICOTINE PRODUCTS!!!!  As discussed nicotine severely impairs your body's ability to heal surgical and traumatic wounds but also impairs bone healing.  Wounds and bone heal by forming microscopic blood vessels (angiogenesis) and nicotine is a vasoconstrictor (essentially, shrinks blood vessels).  Therefore, if vasoconstriction occurs to these microscopic blood vessels they essentially disappear and are unable to deliver necessary nutrients to the healing tissue.  This is one modifiable factor that you can do to dramatically increase your chances of healing your injury.    (This means no smoking, no nicotine gum, patches, etc)  DO NOT USE NONSTEROIDAL ANTI-INFLAMMATORY DRUGS (NSAID'S)  Using products such as Advil (ibuprofen), Aleve (naproxen), Motrin (ibuprofen) for additional pain control during fracture healing can delay and/or prevent the healing response.  If you would like to take over the counter (OTC)  medication, Tylenol  (acetaminophen ) is ok.  However, some narcotic medications that are given for pain control contain acetaminophen  as well. Therefore, you should not exceed more than 4000 mg of tylenol  in a day if you do not have liver disease.  Also note that there are may OTC medicines, such as cold medicines and allergy medicines that my contain tylenol  as well.  If you have any questions about medications and/or interactions please ask your doctor/PA or your pharmacist.      ICE AND ELEVATE INJURED/OPERATIVE EXTREMITY  Using ice and elevating the injured extremity above your heart can help with swelling and pain control.  Icing in a pulsatile fashion, such as 20 minutes on and 20 minutes off, can be followed.    Do not place ice directly on skin. Make sure there is a barrier between to skin and the ice pack.    Using frozen items such as frozen peas works well as the conform nicely to the are that needs to be iced.  USE AN ACE WRAP OR TED HOSE FOR SWELLING CONTROL  In addition to icing and elevation, Ace wraps or TED hose are used to help limit and resolve swelling.  It is recommended to use Ace wraps or TED hose until you are informed to stop.    When using Ace Wraps start the wrapping distally (farthest away from the body) and wrap proximally (closer to the body)   Example: If you had surgery on your leg and you do not have a splint on, start the ace wrap at the toes and work your way up to the thigh        If you had surgery on your upper extremity and  do not have a splint on, start the ace wrap at your fingers and work your way up to the upper arm  IF YOU ARE IN A SPLINT OR CAST DO NOT REMOVE IT FOR ANY REASON   If your splint gets wet for any reason please contact the office immediately. You may shower in your splint or cast as long as you keep it dry.  This can be done by wrapping in a cast cover or garbage back (or similar)  Do Not stick any thing down your splint or cast such as  pencils, money, or hangers to try and scratch yourself with.  If you feel itchy take benadryl as prescribed on the bottle for itching  IF YOU ARE IN A CAM BOOT (BLACK BOOT)  You may remove boot periodically. Perform daily dressing changes as noted below.  Wash the liner of the boot regularly and wear a sock when wearing the boot. It is recommended that you sleep in the boot until told otherwise    Call office for the following: Temperature greater than 101F Persistent nausea and vomiting Severe uncontrolled pain Redness, tenderness, or signs of infection (pain, swelling, redness, odor or green/yellow discharge around the site) Difficulty breathing, headache or visual disturbances Hives Persistent dizziness or light-headedness Extreme fatigue Any other questions or concerns you may have after discharge  In an emergency, call 911 or go to an Emergency Department at a nearby hospital  HELPFUL INFORMATION  If you had a block, it will wear off between 8-24 hrs postop typically.  This is period when your pain may go from nearly zero to the pain you would have had postop without the block.  This is an abrupt transition but nothing dangerous is happening.  You may take an extra dose of narcotic when this happens.  You should wean off your narcotic medicines as soon as you are able.  Most patients will be off or using minimal narcotics before their first postop appointment.   We suggest you use the pain medication the first night prior to going to bed, in order to ease any pain when the anesthesia wears off. You should avoid taking pain medications on an empty stomach as it will make you nauseous.  Do not drink alcoholic beverages or take illicit drugs when taking pain medications.  In most states it is against the law to drive while you are in a splint or sling.  And certainly against the law to drive while taking narcotics.  You may return to work/school in the next couple of days when you  feel up to it.   Pain medication may make you constipated.  Below are a few solutions to try in this order: Decrease the amount of pain medication if you aren't having pain. Drink lots of decaffeinated fluids. Drink prune juice and/or each dried prunes  If the first 3 don't work start with additional solutions Take Colace - an over-the-counter stool softener Take Senokot - an over-the-counter laxative Take Miralax - a stronger over-the-counter laxative     CALL THE OFFICE WITH ANY QUESTIONS OR CONCERNS: [phone]   VISIT OUR WEBSITE FOR ADDITIONAL INFORMATION: [email or web address]

## 2023-12-12 NOTE — Progress Notes (Deleted)
 Patient ID: Jennifer [name] MRN: [id number] DOB/AGE: [birth year] 78 y.o.  Primary Care Physician:[name], Jennifer, [place] Primary Cardiologist: Jennifer [name]  CC:  Aortic valvular disease management     FOCUSED PROBLEM LIST:   Aortic stenosis AVA 0.89, MG 35, Vmax 3.7, SVI 65, EF 60-65% TTE 2024 EKG SR without conduction defects LVH No amyloid or HOCM CMR 2024  Atrial flutter On Eliquis  CAD 90% dLAD, 50%pLAD cath 2022 Hyperlipidemia Aortic atherosclerosis Coronary CTA 2022 Hypertension CKDIIIA Chronic debilitating back pain BMI 37/BSA 2.1  7/25:   Patient consents to use of AI scribe. The patient is a 78F with the above listed medical problems referred for recommendations regarding her aortic stenosis.  She has been managed by Jennifer [name].  She underwent coronary angiography which demonstrated a high grade lesion of the distal LAD and given tortuosity of the vessel, medical management was pursued.  She did undergo CMR imaging and amyloid was excluded.  Her last echocardiogram demonstrated progression of her aortic stenosis.               Past Medical History:  Diagnosis Date   Acute meniscal tear of left knee    FOLLOWED BY DR Jennifer [name]   Anemia    Aortic stenosis    Ascending aorta dilation (HCC)    Atrial flutter (HCC)    AV block, Mobitz 1    BPPV (benign paroxysmal positional vertigo)    CAD (coronary artery disease)    Chronic bladder pain    Chronic fatigue syndrome    Chronic low back pain    W/ RIGHT LEG PAIN AND NUMBNESS   Cystitis, chronic    Fibromyalgia    GERD (gastroesophageal reflux disease)    Hiatal hernia    Hypertension    IC (interstitial cystitis)    LBBB (left bundle branch block)    transient hx   OSA (obstructive sleep apnea)    per pt study yrs ago-- moderate osa ,  intolerant cpap   Pinched vertebral nerve    bilateral L2 -- L3 and L3 -- L4-/  epidural injection's treatment , PT and pain clince   PONV (postoperative  nausea and vomiting)    severe   PVC's (premature ventricular contractions)    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    Self-catheterizes urinary bladder    Spinal stenosis, lumbar region with neurogenic claudication    Wears glasses    Wears partial dentures    upper and lower    Past Surgical History:  Procedure Laterality Date   ABDOMINAL AORTOGRAM N/A 09/16/2020   Procedure: ABDOMINAL AORTOGRAM;  Surgeon: Jennifer Deatrice LABOR, MD;  [facility]: [facility];  Service: Cardiovascular;  Laterality: N/A;   ABDOMINAL HYSTERECTOMY  1980   w/ Left Salpingoophorectomy   BIOPSY  08/27/2021   Procedure: BIOPSY;  Surgeon: Jennifer Kitchens, MD;  Location: [facility];  Service: Gastroenterology;;   CARDIAC CATHETERIZATION  08-21-2001  dr victory sharps   normal coronary arteries and LVF   CARPAL TUNNEL RELEASE Bilateral 1986   CECALCYSTOPLASTY/  APPENDECTOMY  1984   at [facility]   CHOLECYSTECTOMY  1992   CYSTO WITH HYDRODISTENSION N/A 01/12/2016   Procedure: CYSTOSCOPY/HYDRODISTENSION;  Surgeon: [name] Gal, MD;  Location: [facility]  Service: Urology;  Laterality: N/A;   CYSTO WITH HYDRODISTENSION N/A 08/30/2016   Procedure: CYSTOSCOPY/HYDRODISTENSION OF BLADDER INJECTION OF MARCAINE /PYRIDIUM ;  Surgeon: Jennifer Arlena, MD;  Location: [facility];  Service: Urology;  Laterality: N/A;   ESOPHAGOGASTRODUODENOSCOPY (EGD) WITH PROPOFOL  N/A 08/27/2021   Procedure: ESOPHAGOGASTRODUODENOSCOPY (EGD) WITH PROPOFOL ;  Surgeon: Jennifer Kitchens, MD;  Location: [facility] ENDOSCOPY;  Service: Gastroenterology;  Laterality: N/A;   LEFT HEART CATH AND CORONARY ANGIOGRAPHY N/A 09/16/2020   Procedure: LEFT HEART CATH AND CORONARY ANGIOGRAPHY;  Surgeon: Jennifer Deatrice LABOR, MD;  Location: [facility];  Service: Cardiovascular;  Laterality: N/A;   MULTIPLE CYSTO/  HYDRODISTENTION/  INSTILLATION THERAPY  last one 08-19-2009   NASAL SINUS SURGERY  1987    ORIF HUMERUS FRACTURE Right 12/09/2023   Procedure: OPEN REDUCTION INTERNAL FIXATION (ORIF) HUMERAL SHAFT FRACTURE;  Surgeon: Jennifer Sharper, MD;  Location: [facility] OR;  Service: Orthopedics;  Laterality: Right;   TONSILLECTOMY  as child   VAGINAL GROWTH REMOVED  as teen    Family History  Problem Relation Age of Onset   CAD Father    Asthma Father    Hypertension Father    Alzheimer's disease Father    Emphysema Father    COPD Father    Heart failure Mother    Peripheral vascular disease Mother    Stroke Mother    Hypertension Mother    Colonic polyp Mother    Cancer Brother        melanoma    Social History   Socioeconomic History   Marital status: Single    Spouse name: none   Number of children: 0   Years of education: College   Highest education level: Not on file  Occupational History   Occupation: n/a  Tobacco Use   Smoking status: Never   Smokeless tobacco: Never  Vaping Use   Vaping status: Never Used  Substance and Sexual Activity   Alcohol use: No    Alcohol/week: 0.0 standard drinks of alcohol   Drug use: No   Sexual activity: Not on file  Other Topics Concern   Not on file  Social History Narrative   Lives alone   Jennifer Luke Kerns, RN    Caffeine use: 2 glasses tea/day   Brother   Mother passed in ?22 at 79, father passed in 58's in 2006   She was a Runner, broadcasting/film/video      Social Drivers of Corporate investment banker Strain: Low Risk  (03/22/2022)   Overall Financial Resource Strain (CARDIA)    Difficulty of Paying Living Expenses: Not very hard  Food Insecurity: No Food Insecurity (12/07/2023)   Hunger Vital Sign    Worried About Running Out of Food in the Last Year: Never true    Ran Out of Food in the Last Year: Never true  Transportation Needs: No Transportation Needs (12/07/2023)   PRAPARE - Administrator, Civil Service (Medical): No    Lack of Transportation (Non-Medical): No  Physical Activity: Not on file  Stress: No Stress Concern  Present (03/22/2022)   [employer] of Occupational Health - Occupational Stress Questionnaire    Feeling of Stress : Only a little  Social Connections: Moderately Integrated (12/07/2023)   Social Connection and Isolation Panel    Frequency of Communication with Friends and Family: Twice a week    Frequency of Social Gatherings with Friends and Family: Twice a week    Attends Religious Services: 1 to 4 times per year    Active Member of [employer] or Organizations: No    Attends Banker Meetings: 1 to 4 times per year    Marital Status: Never married  Intimate Partner Violence: Not At Risk (12/07/2023)   Humiliation, Afraid, Rape, and Kick questionnaire    Fear of Current or Ex-Partner: No    Emotionally Abused: No    Physically Abused: No    Sexually Abused: No     Prior to Admission medications   Medication Sig Start Date End Date Taking? Authorizing Provider  acetaminophen  (TYLENOL ) 500 MG tablet Take 500 mg by mouth 2 (two) times daily as needed (for pain).    [provider]  aluminum hydroxide-magnesium  carbonate (GAVISCON) 95-358 MG/15ML SUSP Take by mouth as needed for indigestion or heartburn. 01/23/17   [provider]  apixaban  (ELIQUIS ) 5 MG TABS tablet Take 1 tablet (5 mg total) by mouth 2 (two) times daily. 09/11/23   Jennifer [name] Stanly LABOR, MD  cyanocobalamin  (VITAMIN B12) 1000 MCG tablet Take 1,000 mcg by mouth daily. Patient not taking: Reported on 12/07/2023 07/12/22   [provider]  dextromethorphan -guaiFENesin  (MUCINEX  DM) 30-600 MG 12hr tablet Take 1 tablet by mouth every 12 (twelve) hours as needed for cough.    [provider]  ezetimibe  (ZETIA ) 10 MG tablet Take 1 tablet (10 mg total) by mouth daily. 06/10/23   Chandrasekhar, Stanly LABOR, MD  fexofenadine (ALLEGRA) 60 MG tablet Take 60 mg by mouth daily as needed for allergies. 01/06/14   [provider]  fluconazole  (DIFLUCAN ) 100 MG tablet Take 100 mg by mouth  daily. Patient not taking: Reported on 12/07/2023 05/14/23   [provider]  furosemide  (LASIX ) 40 MG tablet May take an extra dose once daily as needed for swelling 05/27/23   Chandrasekhar, Mahesh A, MD  gabapentin  (NEURONTIN ) 300 MG capsule Take 300 mg by mouth at bedtime. 07/28/21   [provider]  hydrALAZINE  (APRESOLINE ) 50 MG tablet TAKE 1 TABLET BY MOUTH THREE TIMES A DAY 06/07/23   Chandrasekhar, Mahesh A, MD  HYDROcodone -acetaminophen  (NORCO) 7.5-325 MG tablet Take 1 tablet by mouth 3 (three) times daily as needed.    [provider]  HYDROcodone -acetaminophen  (NORCO) 7.5-325 MG tablet Take 1 tablet by mouth every 6 (six) hours as needed for moderate pain (pain score 4-6). 12/12/23   Deward Eck, PA-C  hydrocortisone  (ANUSOL -HC) 25 MG suppository Place 25 mg rectally 2 (two) times daily as needed for hemorrhoids. Patient not taking: Reported on 12/07/2023 12/07/20   [provider]  isosorbide  dinitrate (ISORDIL ) 10 MG tablet TAKE 1 TABLET BY MOUTH THREE TIMES A DAY 06/07/23   Chandrasekhar, Mahesh A, MD  losartan  (COZAAR ) 100 MG tablet TAKE 1 TABLET BY MOUTH EVERY DAY 08/15/23   Chandrasekhar, Mahesh A, MD  meclizine  (ANTIVERT ) 25 MG tablet Take 25 mg by mouth 3 (three) times daily as needed for dizziness.    [provider]  Methen-Hyosc-Meth Blue-Na Phos (UROGESIC-BLUE) 81.6 MG TABS Take 1 tablet by mouth every 6 (six) hours as needed (urinary burning). 08/21/21   [provider]  metoprolol  tartrate (LOPRESSOR ) 25 MG tablet TAKE 1 TABLET BY MOUTH TWICE A DAY 08/15/23   Chandrasekhar, Mahesh A, MD  nitroGLYCERIN  (NITROSTAT ) 0.4 MG SL tablet PLACE 1 TABLET UNDER THE TONGE EVERY 5 MINUTES AS NEEDED FOR CHEST PAIN *TAKE UP TO 3 TABLETS* 02/21/23   Chandrasekhar, Mahesh A, MD  nystatin  cream (MYCOSTATIN ) Apply topically 2 (two) times daily. 08/04/20   Eben Reyes BROCKS, MD  ondansetron  (ZOFRAN -ODT) 4 MG disintegrating tablet Take 4 mg by mouth every 8  (eight) hours as needed for nausea (dissolve orally). 07/01/17   [provider]  pantoprazole  (PROTONIX ) 40 MG tablet Take 40 mg by mouth 2 (two) times daily. 04/15/23   [provider]  polyethylene glycol powder (GLYCOLAX/MIRALAX) 17 GM/SCOOP powder Take 17 g by mouth daily as needed for mild constipation.    [provider]  rosuvastatin  (CRESTOR ) 5 MG tablet Take 1 tablet (5 mg total) by mouth 3 (three) times a week. 08/03/22 05/27/23  Jennifer [name] Stanly LABOR, MD  senna (SENOKOT) 8.6 MG tablet Take 1 tablet by mouth as needed for constipation. Patient not taking: Reported on 12/07/2023 07/28/21   [provider]  spironolactone  (ALDACTONE ) 25 MG tablet Take 1 tablet (25 mg total) by mouth daily. 09/17/23   Chandrasekhar, Stanly LABOR, MD  sucralfate  (CARAFATE ) 1 g tablet Take 1 tablet (1 g total) by mouth 4 (four) times daily -  with meals and at bedtime. Patient taking differently: Take 1 g by mouth daily at 6 (six) AM. 1 tab in am, 1 tab in pm 08/28/21   Patsy Lenis, MD  triamcinolone  cream (KENALOG ) 0.1 % Apply 1 application. topically 2 (two) times daily as needed (rash). Patient not taking: Reported on 12/07/2023 04/06/21   [provider]    Allergies  Allergen Reactions   Codeine Hives, Nausea And Vomiting and Rash   Dimethyl Sulfoxide Hives and Itching   Macrodantin  [Nitrofurantoin  Macrocrystal] Itching, Nausea And Vomiting and Rash    Abdominal and chest pain, also- blood-red skin, also   Moxifloxacin Hives, Shortness Of Breath, Swelling and Rash    Rash was severe, caused tremors, ans skin became BLOODY RED   Nitrofurantoin  Diarrhea, Itching, Rash and Other (See Comments)    GI/stomach upset, sweating, chest pain, and skin turned bloody red   Quinolones Anaphylaxis   Sulfa Antibiotics Hives and Rash   Tape Rash   Topiramate Itching   Furosemide  Other (See Comments)    CANNOT TAKE DUE TO Interstitial cystitis   Duloxetine     Other reaction(s):  Other (See Comments)   Duloxetine Hcl Nausea Only and Other (See Comments)    Vertigo and heart racing also    Ranexa  [Ranolazine ]     Constipation/heart pounding   Robaxin [Methocarbamol] Other (See Comments)    Make me feel flu-like symptoms    REVIEW OF SYSTEMS:  General: no fevers/chills/night sweats Eyes: no blurry vision, diplopia, or amaurosis ENT: no sore throat or hearing loss Resp: no cough, wheezing, or hemoptysis CV: no edema or palpitations GI: no abdominal pain, nausea, vomiting, diarrhea, or constipation GU: no dysuria, frequency, or hematuria Skin: no rash Neuro: no headache, numbness, tingling, or weakness of extremities Musculoskeletal: no joint pain or swelling Heme: no bleeding, DVT, or easy bruising Endo: no polydipsia or polyuria  There were no vitals taken for this visit.  PHYSICAL EXAM: GEN:  AO x 3 in no acute distress HEENT: normal Dentition: Normal*** Neck: JVP normal. +2***carotid upstrokes without bruits. No thyromegaly. Lungs: equal expansion, clear bilaterally CV: Apex is discrete and nondisplaced, RRR without murmur or gallop*** Abd: soft, non-tender, non-distended; no bruit; positive bowel sounds Ext: no edema, ecchymoses, or cyanosis Vascular: 2+ femoral pulses, 2+ radial pulses       Skin: warm and dry without rash Neuro: CN II-XII grossly intact; motor and sensory grossly intact    DATA AND STUDIES:  EKG:  EKG Interpretation Date/Time:    Ventricular Rate:    PR Interval:    QRS Duration:    QT Interval:    QTC Calculation:   R Axis:  Text Interpretation:          CARDIAC STUDIES: Refer to CV Procedures and Imaging Tabs  12/08/2023: ALT 27 12/10/2023: BUN 15; Creatinine, Ser 0.88; Potassium 4.1; Sodium 138 12/12/2023: Hemoglobin 9.3; Platelets 268   STS RISK CALCULATOR: Pending  NHYA CLASS: ***    ASSESSMENT AND PLAN:   No diagnosis found.     I have personally reviewed the patients imaging data as  summarized above.  I have reviewed the natural history of aortic stenosis with the patient and family members who are present today. We have discussed the limitations of medical therapy and the poor prognosis associated with symptomatic aortic stenosis. We have also reviewed potential treatment options, including palliative medical therapy, conventional surgical aortic valve replacement, and transcatheter aortic valve replacement. We discussed treatment options in the context of this patient's specific comorbid medical conditions.   All of the patient's questions were answered today. Will make further recommendations based on the results of studies outlined above.   I spent *** minutes reviewing all clinical data during and prior to this visit including all relevant imaging studies, laboratories, clinical information from other health systems and prior notes from both Cardiology and other specialties, interviewing the patient, conducting a complete physical examination, and coordinating care in order to formulate a comprehensive and personalized evaluation and treatment plan.   Daren Yeagle K Kiowa Peifer, MD  12/12/2023 11:04 AM    [facility] [address] [place], [place], [place]  [address] Phone: [phone]; Fax: [phone]

## 2023-12-12 NOTE — Progress Notes (Addendum)
 Occupational Therapy Treatment Patient Details Name: KORY PANJWANI MRN: [id number] DOB: 01-Jul-1945 Today's Date: 12/12/2023   History of present illness Pt is a 78 y.o. female presenting 9/6 after fall at home on 9/3 resulting in R arm pain, nausea. Found to have L surgical neck proximal humerus fx, comminuted humeral shaft fx; s/p ORIF 9/8. Also found to have R chest wall hematoma. PMH: aortic stenosis, atrial flutter, BPPV, CAD, fibromyalgia, GERD, HTN, LBB, OSA.   OT comments  Pt c/o RUE pain and RLE tingling/numbness. Pt assisted to recliner min A x2 for safety, HHA, poor balance with difficulty maintaining balance due to RLE weakness.  Pt ambulated 5 feet and needed to rest, then another 5 feet to recliner. Spoke with Pt about R elbow, hand, and wrist exercises. Pt anxious about symptoms with headache for the last two days and RLE numbness/weakness worsening, Pt spoke extensively about PMH and current situation, worried that more may be going on. Spoke to Pt using active listening and addressed concerns, spoke to Pt about RUE precautions, to reach out to RN if having stroke like symptoms as she is worried about her head, which has been addressed by MD. All concerns addressed and all needs met, will continue to see Pt acutely to progress as able, DC plan to postacute rehab still appropriate.       If plan is discharge home, recommend the following:  Two people to help with walking and/or transfers;A lot of help with bathing/dressing/bathroom;Two people to help with bathing/dressing/bathroom;Assistance with cooking/housework;Assist for transportation;Help with stairs or ramp for entrance   Equipment Recommendations  Other (comment) (defer)    Recommendations for Other Services      Precautions / Restrictions Precautions Precautions: Shoulder;Fall Type of Shoulder Precautions: Sling: For comfort and sleep. Non weight bearing: Yes AROM elbow, wrist and hand to tolerance: Yes. OK for  pendulums: Yes. Forward Flexion: 0-90 Abduction: 0-60 External Rotation: 0-30 AROM of shoulder: No. OT Consult Special Instructions: no active shoulder abduction Shoulder Interventions: For comfort;Shoulder sling/immobilizer Precaution Booklet Issued: Yes (comment) Recall of Precautions/Restrictions: Impaired Precaution/Restrictions Comments: needing redirection to focus on new learning topics Restrictions Weight Bearing Restrictions Per Provider Order: Yes RUE Weight Bearing Per Provider Order: Non weight bearing       Mobility Bed Mobility               General bed mobility comments: sitting EOB upon entry    Transfers Overall transfer level: Needs assistance Equipment used: 1 person hand held assist Transfers: Sit to/from Stand, Bed to chair/wheelchair/BSC Sit to Stand: Min assist     Step pivot transfers: Min assist, +2 physical assistance     General transfer comment: min A x2 for safety, Pt c/o RLE numbness and weakness     Balance Overall balance assessment: Needs assistance Sitting-balance support: No upper extremity supported, Feet supported Sitting balance-Leahy Scale: Fair     Standing balance support: Single extremity supported, During functional activity, Reliant on assistive device for balance Standing balance-Leahy Scale: Poor Standing balance comment: L HHA with min A to ambulate and stand                           ADL either performed or assessed with clinical judgement   ADL  General ADL Comments: transfer to recliner min A with HHA, focused on RUE positioning and precautions    Extremity/Trunk Assessment Upper Extremity Assessment Upper Extremity Assessment: RUE deficits/detail RUE: Shoulder pain with ROM            Vision       Perception     Praxis     Communication Communication Communication: No apparent difficulties   Cognition Arousal: Alert Behavior  During Therapy: Anxious Cognition: No apparent impairments             OT - Cognition Comments: Pt A/O, able to fully participate, is anxious about symptoms and needs redirection due to speaking extensively on symptoms and history                 Following commands: Intact        Cueing   Cueing Techniques: Verbal cues  Exercises      Shoulder Instructions       General Comments      Pertinent Vitals/ Pain       Pain Assessment Pain Assessment: Faces Faces Pain Scale: Hurts even more Pain Location: R shoulder Pain Descriptors / Indicators: Sore, Grimacing, Guarding, Discomfort, Operative site guarding Pain Intervention(s): Monitored during session  Home Living                                          Prior Functioning/Environment              Frequency  Min 2X/week        Progress Toward Goals  OT Goals(current goals can now be found in the care plan section)  Progress towards OT goals: Progressing toward goals  Acute Rehab OT Goals Patient Stated Goal: to improve BLE strength OT Goal Formulation: With patient Time For Goal Achievement: 12/24/23 Potential to Achieve Goals: Good  Plan      Co-evaluation                 AM-PAC OT 6 Clicks Daily Activity     Outcome Measure   Help from another person eating meals?: A Little Help from another person taking care of personal grooming?: A Little Help from another person toileting, which includes using toliet, bedpan, or urinal?: A Lot Help from another person bathing (including washing, rinsing, drying)?: A Lot Help from another person to put on and taking off regular upper body clothing?: A Lot Help from another person to put on and taking off regular lower body clothing?: Total 6 Click Score: 13    End of Session Equipment Utilized During Treatment: Gait belt  OT Visit Diagnosis: Unsteadiness on feet (R26.81);Muscle weakness (generalized) (M62.81);Other  symptoms and signs involving cognitive function;Pain Pain - Right/Left: Right Pain - part of body: Shoulder   Activity Tolerance Patient tolerated treatment well   Patient Left in chair;with call bell/phone within reach;with chair alarm set   Nurse Communication Mobility status        Time: 8859-8784 OT Time Calculation (min): 35 min  Charges: OT General Charges $OT Visit: 1 Visit OT Treatments $Self Care/Home Management : 8-22 mins $Therapeutic Activity: 8-22 mins  Kristia Jupiter, OTR/L   Elouise JONELLE Bott 12/12/2023, 12:32 PM

## 2023-12-13 DIAGNOSIS — D509 Iron deficiency anemia, unspecified: Secondary | ICD-10-CM | POA: Diagnosis not present

## 2023-12-13 DIAGNOSIS — G629 Polyneuropathy, unspecified: Secondary | ICD-10-CM | POA: Diagnosis not present

## 2023-12-13 DIAGNOSIS — R2689 Other abnormalities of gait and mobility: Secondary | ICD-10-CM | POA: Diagnosis not present

## 2023-12-13 DIAGNOSIS — Z23 Encounter for immunization: Secondary | ICD-10-CM | POA: Diagnosis present

## 2023-12-13 DIAGNOSIS — R42 Dizziness and giddiness: Secondary | ICD-10-CM | POA: Diagnosis not present

## 2023-12-13 DIAGNOSIS — R2681 Unsteadiness on feet: Secondary | ICD-10-CM | POA: Diagnosis not present

## 2023-12-13 DIAGNOSIS — E039 Hypothyroidism, unspecified: Secondary | ICD-10-CM | POA: Diagnosis not present

## 2023-12-13 DIAGNOSIS — F4321 Adjustment disorder with depressed mood: Secondary | ICD-10-CM | POA: Diagnosis not present

## 2023-12-13 DIAGNOSIS — I110 Hypertensive heart disease with heart failure: Secondary | ICD-10-CM | POA: Diagnosis not present

## 2023-12-13 DIAGNOSIS — J309 Allergic rhinitis, unspecified: Secondary | ICD-10-CM | POA: Diagnosis not present

## 2023-12-13 DIAGNOSIS — Z7189 Other specified counseling: Secondary | ICD-10-CM | POA: Diagnosis not present

## 2023-12-13 DIAGNOSIS — W19XXXA Unspecified fall, initial encounter: Secondary | ICD-10-CM | POA: Diagnosis not present

## 2023-12-13 DIAGNOSIS — M4986 Spondylopathy in diseases classified elsewhere, lumbar region: Secondary | ICD-10-CM | POA: Diagnosis not present

## 2023-12-13 DIAGNOSIS — R531 Weakness: Secondary | ICD-10-CM | POA: Diagnosis not present

## 2023-12-13 DIAGNOSIS — M545 Low back pain, unspecified: Secondary | ICD-10-CM | POA: Diagnosis not present

## 2023-12-13 DIAGNOSIS — H8110 Benign paroxysmal vertigo, unspecified ear: Secondary | ICD-10-CM | POA: Diagnosis not present

## 2023-12-13 DIAGNOSIS — R350 Frequency of micturition: Secondary | ICD-10-CM | POA: Diagnosis not present

## 2023-12-13 DIAGNOSIS — J952 Acute pulmonary insufficiency following nonthoracic surgery: Secondary | ICD-10-CM | POA: Diagnosis not present

## 2023-12-13 DIAGNOSIS — I5032 Chronic diastolic (congestive) heart failure: Secondary | ICD-10-CM

## 2023-12-13 DIAGNOSIS — K589 Irritable bowel syndrome without diarrhea: Secondary | ICD-10-CM | POA: Diagnosis not present

## 2023-12-13 DIAGNOSIS — G9332 Myalgic encephalomyelitis/chronic fatigue syndrome: Secondary | ICD-10-CM | POA: Diagnosis not present

## 2023-12-13 DIAGNOSIS — Z7901 Long term (current) use of anticoagulants: Secondary | ICD-10-CM | POA: Diagnosis not present

## 2023-12-13 DIAGNOSIS — N3090 Cystitis, unspecified without hematuria: Secondary | ICD-10-CM

## 2023-12-13 DIAGNOSIS — M5410 Radiculopathy, site unspecified: Secondary | ICD-10-CM | POA: Diagnosis not present

## 2023-12-13 DIAGNOSIS — I4891 Unspecified atrial fibrillation: Secondary | ICD-10-CM | POA: Diagnosis not present

## 2023-12-13 DIAGNOSIS — Z79899 Other long term (current) drug therapy: Secondary | ICD-10-CM | POA: Diagnosis not present

## 2023-12-13 DIAGNOSIS — Z9181 History of falling: Secondary | ICD-10-CM | POA: Diagnosis not present

## 2023-12-13 DIAGNOSIS — S42351D Displaced comminuted fracture of shaft of humerus, right arm, subsequent encounter for fracture with routine healing: Secondary | ICD-10-CM | POA: Diagnosis not present

## 2023-12-13 DIAGNOSIS — D62 Acute posthemorrhagic anemia: Secondary | ICD-10-CM | POA: Diagnosis not present

## 2023-12-13 DIAGNOSIS — E559 Vitamin D deficiency, unspecified: Secondary | ICD-10-CM

## 2023-12-13 DIAGNOSIS — K219 Gastro-esophageal reflux disease without esophagitis: Secondary | ICD-10-CM | POA: Diagnosis not present

## 2023-12-13 DIAGNOSIS — N3010 Interstitial cystitis (chronic) without hematuria: Secondary | ICD-10-CM | POA: Diagnosis not present

## 2023-12-13 DIAGNOSIS — E669 Obesity, unspecified: Secondary | ICD-10-CM | POA: Diagnosis not present

## 2023-12-13 DIAGNOSIS — I2510 Atherosclerotic heart disease of native coronary artery without angina pectoris: Secondary | ICD-10-CM | POA: Diagnosis not present

## 2023-12-13 DIAGNOSIS — K449 Diaphragmatic hernia without obstruction or gangrene: Secondary | ICD-10-CM | POA: Diagnosis not present

## 2023-12-13 DIAGNOSIS — I4892 Unspecified atrial flutter: Secondary | ICD-10-CM | POA: Diagnosis not present

## 2023-12-13 DIAGNOSIS — S42211D Unspecified displaced fracture of surgical neck of right humerus, subsequent encounter for fracture with routine healing: Secondary | ICD-10-CM | POA: Diagnosis not present

## 2023-12-13 DIAGNOSIS — E538 Deficiency of other specified B group vitamins: Secondary | ICD-10-CM | POA: Diagnosis not present

## 2023-12-13 DIAGNOSIS — M5459 Other low back pain: Secondary | ICD-10-CM | POA: Diagnosis not present

## 2023-12-13 DIAGNOSIS — M6281 Muscle weakness (generalized): Secondary | ICD-10-CM | POA: Diagnosis not present

## 2023-12-13 DIAGNOSIS — R278 Other lack of coordination: Secondary | ICD-10-CM | POA: Diagnosis not present

## 2023-12-13 DIAGNOSIS — G609 Hereditary and idiopathic neuropathy, unspecified: Secondary | ICD-10-CM | POA: Diagnosis not present

## 2023-12-13 DIAGNOSIS — Z743 Need for continuous supervision: Secondary | ICD-10-CM | POA: Diagnosis not present

## 2023-12-13 DIAGNOSIS — S42291D Other displaced fracture of upper end of right humerus, subsequent encounter for fracture with routine healing: Secondary | ICD-10-CM | POA: Diagnosis not present

## 2023-12-13 DIAGNOSIS — F411 Generalized anxiety disorder: Secondary | ICD-10-CM | POA: Diagnosis not present

## 2023-12-13 DIAGNOSIS — W1830XA Fall on same level, unspecified, initial encounter: Secondary | ICD-10-CM | POA: Diagnosis not present

## 2023-12-13 DIAGNOSIS — T8484XD Pain due to internal orthopedic prosthetic devices, implants and grafts, subsequent encounter: Secondary | ICD-10-CM | POA: Diagnosis not present

## 2023-12-13 DIAGNOSIS — I10 Essential (primary) hypertension: Secondary | ICD-10-CM | POA: Diagnosis not present

## 2023-12-13 DIAGNOSIS — Y92009 Unspecified place in unspecified non-institutional (private) residence as the place of occurrence of the external cause: Secondary | ICD-10-CM | POA: Diagnosis not present

## 2023-12-13 DIAGNOSIS — N33 Bladder disorders in diseases classified elsewhere: Secondary | ICD-10-CM | POA: Diagnosis not present

## 2023-12-13 LAB — CBC
HCT: 28.9 % — ABNORMAL LOW (ref 36.0–46.0)
Hemoglobin: 9 g/dL — ABNORMAL LOW (ref 12.0–15.0)
MCH: 28.3 pg (ref 26.0–34.0)
MCHC: 31.1 g/dL (ref 30.0–36.0)
MCV: 90.9 fL (ref 80.0–100.0)
Platelets: 255 K/uL (ref 150–400)
RBC: 3.18 MIL/uL — ABNORMAL LOW (ref 3.87–5.11)
RDW: 15.9 % — ABNORMAL HIGH (ref 11.5–15.5)
WBC: 8.3 K/uL (ref 4.0–10.5)
nRBC: 0 % (ref 0.0–0.2)

## 2023-12-13 MED ORDER — FUROSEMIDE 40 MG PO TABS: 40.0000 mg | ORAL_TABLET | Freq: Every day | ORAL | Status: DC | PRN

## 2023-12-13 MED ORDER — VITAMIN B-12 1000 MCG PO TABS
1000.0000 ug | ORAL_TABLET | Freq: Every day | ORAL | Status: DC
  Administered 2023-12-13: 1000 ug via ORAL
  Filled 2023-12-13: qty 1

## 2023-12-13 MED ORDER — PREGABALIN 75 MG PO CAPS: 75.0000 mg | ORAL_CAPSULE | Freq: Two times a day (BID) | ORAL | 1 refills | Status: AC

## 2023-12-13 MED ORDER — IRON 325 (65 FE) MG PO TABS: 1.0000 | ORAL_TABLET | Freq: Every day | ORAL | 1 refills | Status: AC

## 2023-12-13 MED ORDER — VITAMIN B-12 1000 MCG PO TABS: 1000.0000 ug | ORAL_TABLET | Freq: Every day | ORAL | 1 refills | Status: AC

## 2023-12-13 NOTE — TOC Progression Note (Signed)
 Transition of Care [facility]) - Progression Note    Patient Details  Name: Jennifer [name] MRN: [id number] Date of Birth: [birth year]  [facility] CM/SW Contact  Bridget Cordella Simmonds, LCSW Phone Number: 12/13/2023, 11:24 AM  Clinical Narrative:   CSW spoke with pt regarding transportation, PT not requiring ambulance transport.     10:00: CSW confirmed with [facility] that they can receive pt today.  1100: CSW informed pt has changed her mind about SNF, asking about [name] and [facility].  CSW spoke with both facilities, both could receive pt today.  1120: CSW spoke with pt again, informed her that all three SNF options are able to accept her.  She has decided to stay with [facility].  She would also like to move forward with ambulance transport, is aware she could receive bill.      Expected Discharge Plan: Skilled Nursing Facility Barriers to Discharge: Continued Medical Work up               Expected Discharge Plan and Services In-house Referral: Clinical Social Work   Post Acute Care Choice: IP Rehab Living arrangements for the past 2 months: Single Family Home                                       Social Drivers of Health (SDOH) Interventions SDOH Screenings   Food Insecurity: No Food Insecurity (12/07/2023)  Housing: Low Risk  (12/07/2023)  Transportation Needs: No Transportation Needs (12/07/2023)  Utilities: Not At Risk (12/07/2023)  Depression (PHQ2-9): Low Risk  (04/30/2023)  Financial Resource Strain: Low Risk  (03/22/2022)  Social Connections: Moderately Integrated (12/07/2023)  Stress: No Stress Concern Present (03/22/2022)  Tobacco Use: Low Risk  (12/07/2023)    Readmission Risk Interventions     No data to display

## 2023-12-13 NOTE — Plan of Care (Signed)
  Problem: Education: Goal: Knowledge of General Education information will improve Description: Including pain rating scale, medication(s)/side effects and non-pharmacologic comfort measures Outcome: Progressing   Problem: Clinical Measurements: Goal: Ability to maintain clinical measurements within normal limits will improve Outcome: Progressing Goal: Will remain free from infection Outcome: Progressing Goal: Respiratory complications will improve Outcome: Progressing Goal: Cardiovascular complication will be avoided Outcome: Progressing   Problem: Activity: Goal: Risk for activity intolerance will decrease Outcome: Progressing   Problem: Elimination: Goal: Will not experience complications related to bowel motility Outcome: Progressing Goal: Will not experience complications related to urinary retention Outcome: Progressing   Problem: Pain Managment: Goal: General experience of comfort will improve and/or be controlled Outcome: Progressing

## 2023-12-13 NOTE — TOC Transition Note (Signed)
 Transition of Care [facility]) - Discharge Note   Patient Details  Name: Jennifer [name] MRN: [id number] Date of Birth: 01/15/1946  Transition of Care [facility]) CM/SW Contact:  Bridget Cordella Simmonds, LCSW Phone Number: 12/13/2023, 12:50 PM   Clinical Narrative:   Pt discharging to [facility], room 601P.  RN call report to [phone].  PTAR called [phone].     Final next level of care: Skilled Nursing Facility Barriers to Discharge: Barriers Resolved   Patient Goals and CMS Choice Patient states their goals for this hospitalization and ongoing recovery are:: get back to my apartment          Discharge Placement              Patient chooses bed at: [facility] Patient to be transferred to facility by: ptar Name of family member notified: permission not given to make contact    Discharge Plan and Services Additional resources added to the After Visit Summary for   In-house Referral: Clinical Social Work   Post Acute Care Choice: IP Rehab                               Social Drivers of Health (SDOH) Interventions SDOH Screenings   Food Insecurity: No Food Insecurity (12/07/2023)  Housing: Low Risk  (12/07/2023)  Transportation Needs: No Transportation Needs (12/07/2023)  Utilities: Not At Risk (12/07/2023)  Depression (PHQ2-9): Low Risk  (04/30/2023)  Financial Resource Strain: Low Risk  (03/22/2022)  Social Connections: Moderately Integrated (12/07/2023)  Stress: No Stress Concern Present (03/22/2022)  Tobacco Use: Low Risk  (12/07/2023)     Readmission Risk Interventions     No data to display

## 2023-12-13 NOTE — Progress Notes (Signed)
 Report given to [facility] LPN at [facility].   Metta, RN

## 2023-12-13 NOTE — Progress Notes (Signed)
 Physical Therapy Treatment Patient Details Name: Jennifer [name] MRN: [id number] DOB: February 22, 1946 Today's Date: 12/13/2023   History of Present Illness Pt is a 78 y.o. female presenting 9/6 after fall at home on 9/3 resulting in R arm pain, nausea. Found to have L surgical neck proximal humerus fx, comminuted humeral shaft fx; s/p ORIF 9/8. Also found to have R chest wall hematoma. PMH: aortic stenosis, atrial flutter, BPPV, CAD, fibromyalgia, GERD, HTN, LBB, OSA.    PT Comments  Introduced quad cane for functional mobility. Educated pt on proper and safe use of AD. Demonstrated correct technique. She ambulated a short-distance within the room with minA and a close chair follow. Pt was limited by decreased activity tolerance, quickly fatiguing during gait. She c/o RLE numbness/tingling and was fearful of falling. Pt with slight unsteadiness and postural sway, +2 assist provided throughout session for safety. Will continue to follow acutely and advance appropriately.     If plan is discharge home, recommend the following: Two people to help with walking and/or transfers;A lot of help with bathing/dressing/bathroom;Assistance with cooking/housework;Assist for transportation;Help with stairs or ramp for entrance   Can travel by private vehicle     No  Equipment Recommendations  Wheelchair (measurements PT);Wheelchair cushion (measurements PT);BSC/3in1;Cane;Hospital bed    Recommendations for Other Services       Precautions / Restrictions Precautions Precautions: Shoulder;Fall Type of Shoulder Precautions: Sling: For comfort and sleep. Non weight bearing: Yes AROM elbow, wrist and hand to tolerance: Yes. OK for pendulums: Yes. Forward Flexion: 0-90 Abduction: 0-60 External Rotation: 0-30 AROM of shoulder: No. OT Consult Special Instructions: no active shoulder abduction Shoulder Interventions: For comfort;Shoulder sling/immobilizer Precaution Booklet Issued: Yes (comment) Recall of  Precautions/Restrictions: Intact Restrictions Weight Bearing Restrictions Per Provider Order: Yes RUE Weight Bearing Per Provider Order: Non weight bearing     Mobility  Bed Mobility Overal bed mobility: Needs Assistance Bed Mobility: Supine to Sit     Supine to sit: Min assist, HOB elevated     General bed mobility comments: Pt sat up on L side of bed with increased time. She brought BLE off EOB. Assist to elevate trunk and scoot fwd with use of bed pad.    Transfers Overall transfer level: Needs assistance Equipment used: Quad cane Transfers: Sit to/from Stand, Bed to chair/wheelchair/BSC Sit to Stand: Min assist   Step pivot transfers: Min assist, +2 safety/equipment (chair follow)       General transfer comment: Pt stood from lowest bed height. Pushed up with LUE from bed, transitioned onto quad cane. Transferred to recliner chair on her left. Good eccentric control reaching back for arm rest prior to sitting.    Ambulation/Gait Ambulation/Gait assistance: Min assist, +2 safety/equipment (chair follow) Gait Distance (Feet): 15 Feet (1x15, seated rest in bathroom, 1x5) Assistive device: Quad cane Gait Pattern/deviations: Step-through pattern, Decreased step length - right, Decreased step length - left, Decreased stride length Gait velocity: reduced     General Gait Details: Introduced quad cane and educated pt on proper sequencing and safe use of AD. She utilized quad cane in LUE and advanced RLE at the same time. Pt took small steps. She intermittently used just the medial legs of quad cane. Cued pt to allow flat floor contact of the AD for increased support. No knee buckling. Slight postural sway. Pt c/o numbness and tingling down her RLE.   Stairs             Armed forces technical officer  Bed    Modified Rankin (Stroke Patients Only)       Balance Overall balance assessment: Needs assistance Sitting-balance support: No upper extremity supported, Feet  supported Sitting balance-Leahy Scale: Fair     Standing balance support: Single extremity supported, During functional activity, Reliant on assistive device for balance Standing balance-Leahy Scale: Poor Standing balance comment: Pt dependent on quad cane and minA.                            Communication Communication Communication: No apparent difficulties  Cognition Arousal: Alert Behavior During Therapy: Anxious   PT - Cognitive impairments: Attention                       PT - Cognition Comments: Pt very verbose and emotional about current health situation. Provided active listening, offered encourgement, and consoled pt.  Redirection to focus on task. Following commands: Impaired Following commands impaired: Follows one step commands with increased time    Cueing Cueing Techniques: Verbal cues  Exercises      General Comments General comments (skin integrity, edema, etc.): Pt had BM in restroom was dependent for pericare.      Pertinent Vitals/Pain Pain Assessment Pain Assessment: Faces Faces Pain Scale: Hurts little more Pain Location: R shoulder Pain Descriptors / Indicators: Operative site guarding, Grimacing, Discomfort, Aching, Sore Pain Intervention(s): Monitored during session, Limited activity within patient's tolerance, Repositioned    Home Living                          Prior Function            PT Goals (current goals can now be found in the care plan section) Acute Rehab PT Goals Patient Stated Goal: Regain independence Progress towards PT goals: Progressing toward goals    Frequency    Min 2X/week      PT Plan      Co-evaluation              AM-PAC PT 6 Clicks Mobility   Outcome Measure  Help needed turning from your back to your side while in a flat bed without using bedrails?: A Little Help needed moving from lying on your back to sitting on the side of a flat bed without using bedrails?: A  Little Help needed moving to and from a bed to a chair (including a wheelchair)?: A Little Help needed standing up from a chair using your arms (e.g., wheelchair or bedside chair)?: A Little Help needed to walk in hospital room?: Total Help needed climbing 3-5 steps with a railing? : Total 6 Click Score: 14    End of Session Equipment Utilized During Treatment: Gait belt Activity Tolerance: Patient tolerated treatment well;Patient limited by pain;Patient limited by fatigue Patient left: in chair;with call bell/phone within reach;with chair alarm set Nurse Communication: Mobility status;Other (comment) (pt request to have her teeth brushed and hair combed) PT Visit Diagnosis: Unsteadiness on feet (R26.81);History of falling (Z91.81);Difficulty in walking, not elsewhere classified (R26.2);Pain;Muscle weakness (generalized) (M62.81);Repeated falls (R29.6);Other abnormalities of gait and mobility (R26.89) Pain - Right/Left: Right Pain - part of body: Shoulder     Time: 8566-8498 PT Time Calculation (min) (ACUTE ONLY): 28 min  Charges:    $Gait Training: 8-22 mins $Therapeutic Activity: 8-22 mins PT General Charges $$ ACUTE PT VISIT: 1 Visit  Randall SAUNDERS, PT, DPT Acute Rehabilitation Services Office: [phone] Secure Chat Preferred  Delon CHRISTELLA Callander 12/13/2023, 3:44 PM

## 2023-12-13 NOTE — Discharge Summary (Signed)
 Physician Discharge Summary   Patient: Jennifer [name] MRN: [id number] DOB: May 23, 1945  Admit date:     12/06/2023  Discharge date: 12/13/23  Discharge Physician: Concepcion Riser   PCP: Katina Pfeiffer, PA-C   Recommendations at discharge:    PCP follow up in 1 week. Ortho follow up as scheduled.  Discharge Diagnoses: Principal Problem:   Right humeral fracture Active Problems:   Chronic diastolic CHF (congestive heart failure) (HCC)   HTN (hypertension)   Recurrent bacterial cystitis   Vertigo   Obesity (BMI 30-39.9)   Hypothyroidism   Vitamin D  deficiency   B12 deficiency  Resolved Problems:   * No resolved hospital problems. [facility] Course: Jennifer [name] is a 78 y.o. 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 She had severely displaced and comminuted fracture involving the proximal right humeral shaft which extends proximally into the proximal right humeral head and neck. Patient admitted to [facility] service, orthopedic surgery consulted.  She had ORIF 12/09/23, ortho suggested sling when out of bed, pain control. She is hemodynamically stable to be discharged to SNF.   Assessment and Plan: Comminuted fracture involving the proximal right humerus shaft Orthopedics follow up appreciated. S/p ORIF of the right humerus 12/09/23 Ortho suggested shoulder sling, Pain control, encourage out of bed. Resumed Eliquis  12/10/23. Per ortho  Nonweightbearing right upper extremity             No lifting with right  arm             Unrestricted range of motion of right elbow, forearm wrist and hand             Passive shoulder flexion and extension             Passive shoulder abduction             No active shoulder abduction for approximately 6 weeks               Sling for comfort - Sling should be off when in chair or bed                         Sling on when up mobilizing, PT/ OT, dressing change as needed. I advised PCP follow up and ortho follow up as scheduled.   Vitamin D  deficiency Replacements ordered. Recheck in 6 months.    Acute blood loss anemia in the setting of eliquis  use S/p 3 units of prbc transfusion.  Hemoglobin stable around 9  Monitor h/h, transfuse for Hb<8.  Anemia panel wnl.    Atrial fibrillation Rate controlled on BB. Continue Eliquis . Outpatient cardiology follow up.    Chronic interstitial cystitis, in the setting of chronic ESBL Ecoli, MDR klebsiella  UA abnormal, she reports some burning while placing a purewick.  She self caths.  She follows up with urology outpatient.  Urine cultures growing klebsiella sensitive to keflex  finished 5 days of antibiotics.     Severe AS HFpEF- Continue metoprolol , isordil  and hydralazine .  Resumed Losartan , aldactone . Got 1 time lasix  yesterday. Continue lasix  as needed. Outpatient cardiology follow up  as scheduled.    Hypertension Controlled on current regimen, metoprolol , isordil , ARB, aldactone  and hydralazine .     Hypothyroidism: Continue synthroid.   Obesity Class II- BMI 38.16 Diet, exercise and weight reduction advised.        Consultants: Orthopedic surgery Procedures performed: S/p ORIF of the right humerus 12/09/23  Disposition: Skilled nursing facility Diet recommendation:  Discharge Diet Orders (From admission, onward)     Start     Ordered   12/13/23 0000  Diet - low sodium heart healthy        12/13/23 1144           Cardiac diet DISCHARGE MEDICATION: Allergies as of 12/13/2023        Reactions   Codeine Hives, Nausea And Vomiting, Rash   Dimethyl Sulfoxide Hives, Itching   Macrodantin  [nitrofurantoin  Macrocrystal] Itching, Nausea And Vomiting, Rash   Abdominal and chest pain, also- blood-red skin, also   Moxifloxacin Hives, Shortness Of Breath, Swelling, Rash   Rash was severe, caused tremors, ans skin became BLOODY RED   Nitrofurantoin  Diarrhea, Itching, Rash, Other (See Comments)   GI/stomach upset, sweating, chest pain, and skin turned bloody red   Quinolones Anaphylaxis   Sulfa Antibiotics Hives, Rash   Tape Rash   Topiramate Itching   Furosemide  Other (See Comments)   CANNOT TAKE DUE TO Interstitial cystitis   Duloxetine    Other reaction(s): Other (See Comments)   Duloxetine Hcl Nausea Only, Other (See Comments)   Vertigo and heart racing also   Ranexa  [ranolazine ]    Constipation/heart pounding   Robaxin [methocarbamol] Other (See Comments)   Make me feel flu-like symptoms        Medication List     STOP taking these medications    gabapentin  300 MG capsule Commonly known as: NEURONTIN        TAKE these medications    acetaminophen  500 MG tablet Commonly known as: TYLENOL  Take 500 mg by mouth 2 (two) times daily as needed (for pain).   apixaban  5 MG Tabs tablet Commonly known as: Eliquis  Take 1 tablet (5 mg total) by mouth 2 (two) times daily.   cyanocobalamin  1000 MCG tablet Commonly known as: VITAMIN B12 Take 1 tablet (1,000 mcg total) by mouth daily.   dextromethorphan -guaiFENesin  30-600 MG 12hr tablet Commonly known as: MUCINEX  DM Take 1 tablet by mouth every 12 (twelve) hours as needed for cough.   ezetimibe  10 MG tablet Commonly known as: ZETIA  Take 1 tablet (10 mg total) by mouth daily.   fexofenadine 60 MG tablet Commonly known as: ALLEGRA Take 60 mg by mouth daily as needed for allergies.   fluconazole  100 MG tablet Commonly known as: DIFLUCAN  Take 100 mg by mouth daily.   furosemide  40 MG tablet Commonly known  as: LASIX  Take 1 tablet (40 mg total) by mouth daily as needed for fluid or edema. May take an extra dose once daily as needed for swelling What changed:  how much to take how to take this when to take this reasons to take this   Gaviscon 95-358 MG/15ML Susp Generic drug: aluminum hydroxide-magnesium  carbonate Take by mouth as needed for indigestion or heartburn.   hydrALAZINE  50 MG tablet Commonly known as: APRESOLINE  TAKE 1 TABLET BY MOUTH THREE TIMES A DAY   HYDROcodone -acetaminophen  7.5-325 MG tablet Commonly known as: NORCO Take 1 tablet by mouth every 6 (six) hours as needed for moderate pain (pain score 4-6). What changed:  when to take this reasons to take this  hydrocortisone  25 MG suppository Commonly known as: ANUSOL -HC Place 25 mg rectally 2 (two) times daily as needed for hemorrhoids.   Iron  325 (65 Fe) MG Tabs Take 1 tablet (325 mg total) by mouth daily.   isosorbide  dinitrate 10 MG tablet Commonly known as: ISORDIL  TAKE 1 TABLET BY MOUTH THREE TIMES A DAY   losartan  100 MG tablet Commonly known as: COZAAR  TAKE 1 TABLET BY MOUTH EVERY DAY   meclizine  25 MG tablet Commonly known as: ANTIVERT  Take 25 mg by mouth 3 (three) times daily as needed for dizziness.   metoprolol  tartrate 25 MG tablet Commonly known as: LOPRESSOR  TAKE 1 TABLET BY MOUTH TWICE A DAY   nitroGLYCERIN  0.4 MG SL tablet Commonly known as: NITROSTAT  PLACE 1 TABLET UNDER THE TONGE EVERY 5 MINUTES AS NEEDED FOR CHEST PAIN *TAKE UP TO 3 TABLETS*   nystatin  cream Commonly known as: MYCOSTATIN  Apply topically 2 (two) times daily.   ondansetron  4 MG disintegrating tablet Commonly known as: ZOFRAN -ODT Take 4 mg by mouth every 8 (eight) hours as needed for nausea (dissolve orally).   pantoprazole  40 MG tablet Commonly known as: PROTONIX  Take 40 mg by mouth 2 (two) times daily.   polyethylene glycol powder 17 GM/SCOOP powder Commonly known as: GLYCOLAX/MIRALAX Take 17 g by mouth  daily as needed for mild constipation.   pregabalin  75 MG capsule Commonly known as: LYRICA  Take 1 capsule (75 mg total) by mouth 2 (two) times daily.   rosuvastatin  5 MG tablet Commonly known as: CRESTOR  Take 1 tablet (5 mg total) by mouth 3 (three) times a week.   senna 8.6 MG tablet Commonly known as: SENOKOT Take 1 tablet by mouth as needed for constipation.   spironolactone  25 MG tablet Commonly known as: ALDACTONE  Take 1 tablet (25 mg total) by mouth daily.   sucralfate  1 g tablet Commonly known as: CARAFATE  Take 1 tablet (1 g total) by mouth 4 (four) times daily -  with meals and at bedtime. What changed:  when to take this additional instructions   triamcinolone  cream 0.1 % Commonly known as: KENALOG  Apply 1 application. topically 2 (two) times daily as needed (rash).   Urogesic-Blue 81.6 MG Tabs Take 1 tablet by mouth every 6 (six) hours as needed (urinary burning).   Vitamin D  (Ergocalciferol ) 1.25 MG (50000 UNIT) Caps capsule Commonly known as: DRISDOL  Take 1 capsule (50,000 Units total) by mouth every 7 (seven) days. Start taking on: December 15, 2023               Discharge Care Instructions  (From admission, onward)           Start     Ordered   12/13/23 0000  Leave dressing on - Keep it clean, dry, and intact until clinic visit        12/13/23 1144            Contact information for follow-up providers     Thukkani, Arun K, MD. Go on 12/16/2023.   Specialty: Cardiology Why: Please go to your rescheduled appointment with Dr. Wendel on 12/16/23. Appointment is scheduled for 1:20, please plan to arrive prior to 1 PM. Contact information: [address] [place] [address] [phone]         Parthenia Olivia HERO, PA-C. Go on 12/17/2023.   Specialty: Cardiology Why: Please go to your general cardiology follow up appointment with Rosaline Parthenia, PA on 12/17/23 at 8:15 AM. Please arrive at least 20 minutes early for your  appointment. Contact information: [address] [place] [address] [phone]         Celena Sharper, MD. Schedule an appointment as soon as possible for a visit in 2 week(s).   Specialty: Orthopedic Surgery Contact information: [address] [place] [address] [phone]              Contact information for after-discharge care     Destination     [facility], [place] .   Service: Skilled Nursing Contact information: [address] [facility] [place]  [address] [phone]                    Discharge Exam: Filed Weights   12/07/23 1536 12/09/23 0707  Weight: 96.2 kg 96.2 kg      12/13/2023    8:23 AM 12/13/2023    4:09 AM 12/12/2023    7:52 PM  Vitals with BMI  Systolic 149 152 871  Diastolic 53 54 37  Pulse 63 51 64    General - Elderly Caucasian female, no apparent distress HEENT - PERRLA, EOMI, bruises over fore head, eyes, non tender sinuses. Lung - Clear, basal rales, rhonchi, wheezes. Bruises over chest noted Heart - S1, S2 heard, no murmurs, rubs, trace pedal edema. Abdomen - Soft, non tender, bowel sounds good Neuro - Alert, awake and oriented x 3, non focal exam. Skin - Warm and dry.  Condition at discharge: stable  The results of significant diagnostics from this hospitalization (including imaging, microbiology, ancillary and laboratory) are listed below for reference.   Imaging Studies: DG Shoulder Right Result Date: 12/09/2023 CLINICAL DATA:  Right humeral fracture fixation. EXAM: RIGHT SHOULDER - 2+ VIEW COMPARISON:  Same-day intraoperative radiographs. Preoperative radiographs 12/06/2023 and CT 12/07/2023. FINDINGS: Postsurgical changes from plate and screw fixation of the comminuted fracture of proximal humeral diaphysis. There is near anatomic reduction of the main fracture fragments. There is a small butterfly fragment proximally which remains mildly displaced medially. No evidence of  dislocation or other complication. Surgical drain remains in place. IMPRESSION: Near anatomic reduction of the comminuted proximal humeral fracture status post ORIF. Electronically Signed   By: Elsie Perone M.D.   On: 12/09/2023 16:12   ECHOCARDIOGRAM COMPLETE Result Date: 12/09/2023    ECHOCARDIOGRAM REPORT   Patient Name:   Jennifer [name] Date of Exam: 12/09/2023 Medical Rec #:  [id number]        Height:       62.5 in Accession #:    [id number]       Weight:       212.0 lb Date of Birth:  Apr 10, 1945        BSA:          1.971 m Patient Age:    78 years         BP:           156/64 mmHg Patient Gender: F                HR:           64 bpm. Exam Location:  Inpatient Procedure: 2D Echo, Cardiac Doppler, Color Doppler and Intracardiac            Opacification Agent (Both Spectral and Color Flow Doppler were            utilized during procedure). Indications:    Aortic stenosis I35.0  History:        Patient has prior history of Echocardiogram examinations,  most                 recent 02/14/2023. CAD, Aortic Valve Disease; Risk                 Factors:Hypertension.  Sonographer:    Jayson Gaskins Referring Phys: [phone] HAO MENG IMPRESSIONS  1. Left ventricular ejection fraction, by estimation, is 60 to 65%. The left ventricle has normal function. The left ventricle has no regional wall motion abnormalities. There is severe left ventricular hypertrophy. Left ventricular diastolic parameters  are indeterminate.  2. Right ventricular systolic function is normal. The right ventricular size is normal.  3. Left atrial size was moderately dilated.  4. The mitral valve is degenerative. Trivial mitral valve regurgitation. No evidence of mitral stenosis. Moderate mitral annular calcification.  5. Gradients lower and AVA higher than TTE done on 02/14/23. The aortic valve is tricuspid. There is severe calcifcation of the aortic valve. There is severe thickening of the aortic valve. Aortic valve regurgitation is not visualized.  Moderate aortic valve stenosis.  6. The inferior vena cava is normal in size with greater than 50% respiratory variability, suggesting right atrial pressure of 3 mmHg. FINDINGS  Left Ventricle: Left ventricular ejection fraction, by estimation, is 60 to 65%. The left ventricle has normal function. The left ventricle has no regional wall motion abnormalities. Strain was performed and the global longitudinal strain is indeterminate. The left ventricular internal cavity size was normal in size. There is severe left ventricular hypertrophy. Left ventricular diastolic parameters are indeterminate. Right Ventricle: The right ventricular size is normal. No increase in right ventricular wall thickness. Right ventricular systolic function is normal. Left Atrium: Left atrial size was moderately dilated. Right Atrium: Right atrial size was normal in size. Pericardium: There is no evidence of pericardial effusion. Mitral Valve: The mitral valve is degenerative in appearance. There is moderate thickening of the mitral valve leaflet(s). There is moderate calcification of the mitral valve leaflet(s). Moderate mitral annular calcification. Trivial mitral valve regurgitation. No evidence of mitral valve stenosis. Tricuspid Valve: The tricuspid valve is normal in structure. Tricuspid valve regurgitation is not demonstrated. No evidence of tricuspid stenosis. Aortic Valve: Gradients lower and AVA higher than TTE done on 02/14/23. The aortic valve is tricuspid. There is severe calcifcation of the aortic valve. There is severe thickening of the aortic valve. Aortic valve regurgitation is not visualized. Moderate aortic stenosis is present. Aortic valve mean gradient measures 22.0 mmHg. Aortic valve peak gradient measures 34.8 mmHg. Aortic valve area, by VTI measures 1.17 cm. Pulmonic Valve: The pulmonic valve was normal in structure. Pulmonic valve regurgitation is not visualized. No evidence of pulmonic stenosis. Aorta: The aortic  root is normal in size and structure. Venous: The inferior vena cava is normal in size with greater than 50% respiratory variability, suggesting right atrial pressure of 3 mmHg. IAS/Shunts: No atrial level shunt detected by color flow Doppler. Additional Comments: 3D was performed not requiring image post processing on an independent workstation and was indeterminate.  LEFT VENTRICLE PLAX 2D LVIDd:         4.30 cm   Diastology LVIDs:         2.80 cm   LV e' medial:    5.55 cm/s LV PW:         1.50 cm   LV E/e' medial:  18.7 LV IVS:        1.60 cm   LV e' lateral:   6.31 cm/s LVOT diam:  1.80 cm   LV E/e' lateral: 16.5 LV SV:         90 LV SV Index:   46 LVOT Area:     2.54 cm  LEFT ATRIUM             Index LA Vol (A2C):   62.0 ml 31.45 ml/m LA Vol (A4C):   55.8 ml 28.31 ml/m LA Biplane Vol: 60.3 ml 30.59 ml/m  AORTIC VALVE AV Area (Vmax):    1.17 cm AV Area (Vmean):   1.20 cm AV Area (VTI):     1.17 cm AV Vmax:           295.00 cm/s AV Vmean:          219.000 cm/s AV VTI:            0.770 m AV Peak Grad:      34.8 mmHg AV Mean Grad:      22.0 mmHg LVOT Vmax:         136.00 cm/s LVOT Vmean:        103.000 cm/s LVOT VTI:          0.353 m LVOT/AV VTI ratio: 0.46  AORTA Ao Root diam: 2.90 cm MITRAL VALVE MV Area (PHT): 3.11 cm     SHUNTS MV Decel Time: 244 msec     Systemic VTI:  0.35 m MV E velocity: 104.00 cm/s  Systemic Diam: 1.80 cm MV A velocity: 128.00 cm/s MV E/A ratio:  0.81 Maude Emmer MD Electronically signed by Maude Emmer MD Signature Date/Time: 12/09/2023/3:33:53 PM    Final    DG Humerus Right Result Date: 12/09/2023 CLINICAL DATA:  Fracture, ORIF EXAM: RIGHT HUMERUS - 2+ VIEW COMPARISON:  12/06/2023 FINDINGS: Three fluoroscopic images are obtained during the performance of the procedure and are provided for interpretation only. Images demonstrate plate and screw fixation across the comminuted humeral diaphyseal fracture seen previously, with near anatomic alignment. Please refer to operative  report. Fluoroscopy time: 22.6 seconds, 2.23 mGy IMPRESSION: 1. ORIF right humeral diaphyseal fracture.  Near anatomic alignment. Electronically Signed   By: Ozell Daring M.D.   On: 12/09/2023 14:59   DG C-Arm 1-60 Min-No Report Result Date: 12/09/2023 Fluoroscopy was utilized by the requesting physician.  No radiographic interpretation.   DG C-Arm 1-60 Min-No Report Result Date: 12/09/2023 Fluoroscopy was utilized by the requesting physician.  No radiographic interpretation.   CT SHOULDER RIGHT WO CONTRAST Result Date: 12/07/2023 CLINICAL DATA:  Right shoulder injury after fall. EXAM: CT OF THE UPPER RIGHT EXTREMITY WITHOUT CONTRAST TECHNIQUE: Multidetector CT imaging of the upper right extremity was performed according to the standard protocol. RADIATION DOSE REDUCTION: This exam was performed according to the departmental dose-optimization program which includes automated exposure control, adjustment of the mA and/or kV according to patient size and/or use of iterative reconstruction technique. COMPARISON:  December 06, 2023. FINDINGS: No dislocation is noted involving the glenohumeral joint. Visualized ribs, scapula and clavicle are unremarkable. There appears to be a severely displaced and comminuted fracture involving the proximal right humeral shaft, which extends proximally into the proximal right humeral head and neck. IMPRESSION: There appears to be a severely displaced and comminuted fracture involving the proximal right humeral shaft which extends proximally into the proximal right humeral head and neck. Electronically Signed   By: Lynwood Landy Raddle M.D.   On: 12/07/2023 09:49   CT CHEST ABDOMEN PELVIS W CONTRAST Result Date: 12/07/2023 CLINICAL DATA:  Polytrauma, blunt Fall, severe bruising, on blood  thinners. Fall. Chest and right arm pain. EXAM: CT CHEST, ABDOMEN, AND PELVIS WITH CONTRAST TECHNIQUE: Multidetector CT imaging of the chest, abdomen and pelvis was performed following the standard  protocol during bolus administration of intravenous contrast. RADIATION DOSE REDUCTION: This exam was performed according to the departmental dose-optimization program which includes automated exposure control, adjustment of the mA and/or kV according to patient size and/or use of iterative reconstruction technique. CONTRAST:  75mL OMNIPAQUE  IOHEXOL  350 MG/ML SOLN COMPARISON:  09/15/2020 FINDINGS: CT CHEST FINDINGS Cardiovascular: Heart is mildly enlarged. Scattered coronary artery and aortic atherosclerosis. No evidence of aortic aneurysm. Mediastinum/Nodes: No mediastinal, hilar, or axillary adenopathy. Trachea and esophagus are unremarkable. Thyroid  unremarkable. Lungs/Pleura: No confluent opacities, effusions or pneumothorax. Musculoskeletal: Stranding within the subcutaneous soft tissues of the right breast, likely hematoma. Comminuted, displaced right humeral fracture noted as seen on earlier plain films. No additional acute bony abnormality. CT ABDOMEN PELVIS FINDINGS Hepatobiliary: No hepatic injury or perihepatic hematoma. Prior cholecystectomy. Pancreas: No focal abnormality or ductal dilatation. Spleen: No splenic injury or perisplenic hematoma. Adrenals/Urinary Tract: No adrenal hemorrhage or renal injury identified. Bladder is unremarkable. Stomach/Bowel: Normal appendix. Sigmoid diverticulosis. No active diverticulitis. Stomach and small bowel decompressed, unremarkable. Vascular/Lymphatic: Aortic atherosclerosis. No evidence of aneurysm or adenopathy. Reproductive: Prior hysterectomy.  No adnexal masses. Other: No free fluid or free air. Musculoskeletal: Leftward scoliosis in the lumbar spine with associated degenerative changes. No acute bony abnormality. IMPRESSION: Stranding within the subcutaneous soft tissues of the right breast/chest wall most likely reflecting hematoma. Otherwise no significant traumatic injury in the chest, abdomen or pelvis. Comminuted, displaced right humeral fracture as  seen on earlier plain films. Cardiomegaly, coronary artery disease.  Aortic atherosclerosis. Sigmoid diverticulosis. Electronically Signed   By: Franky Crease M.D.   On: 12/07/2023 00:16   CT Cervical Spine Wo Contrast Result Date: 12/07/2023 CLINICAL DATA:  Fall, neck trauma EXAM: CT CERVICAL SPINE WITHOUT CONTRAST TECHNIQUE: Multidetector CT imaging of the cervical spine was performed without intravenous contrast. Multiplanar CT image reconstructions were also generated. RADIATION DOSE REDUCTION: This exam was performed according to the departmental dose-optimization program which includes automated exposure control, adjustment of the mA and/or kV according to patient size and/or use of iterative reconstruction technique. COMPARISON:  None Available. FINDINGS: Alignment: No subluxation.  Loss of cervical lordosis. Skull base and vertebrae: No acute fracture. No primary bone lesion or focal pathologic process. Soft tissues and spinal canal: No prevertebral fluid or swelling. No visible canal hematoma. Disc levels: Moderate bilateral degenerative facet disease. Fusion across multiple facet joints, left greater than right. Mild degenerative disc disease. Multilevel bilateral neural foraminal narrowing. Upper chest: No acute findings Other: None IMPRESSION: Degenerative disc and facet disease.  No acute bony abnormality. Electronically Signed   By: Franky Crease M.D.   On: 12/07/2023 00:12   CT Head Wo Contrast Result Date: 12/07/2023 CLINICAL DATA:  Fall, head trauma, on blood thinners. EXAM: CT HEAD WITHOUT CONTRAST TECHNIQUE: Contiguous axial images were obtained from the base of the skull through the vertex without intravenous contrast. RADIATION DOSE REDUCTION: This exam was performed according to the departmental dose-optimization program which includes automated exposure control, adjustment of the mA and/or kV according to patient size and/or use of iterative reconstruction technique. COMPARISON:  09/15/2020  FINDINGS: Brain: No acute intracranial abnormality. Specifically, no hemorrhage, hydrocephalus, mass lesion, acute infarction, or significant intracranial injury. There is atrophy and chronic small vessel disease changes. Vascular: No hyperdense vessel or unexpected calcification. Skull: No acute calvarial abnormality.  Sinuses/Orbits: No acute findings Other: Soft tissue swelling in the forehead. IMPRESSION: Atrophy, chronic microvascular disease. No acute intracranial abnormality. Electronically Signed   By: Franky Crease M.D.   On: 12/07/2023 00:10   DG Shoulder Right Result Date: 12/06/2023 CLINICAL DATA:  Clemens, right arm pain EXAM: RIGHT SHOULDER - 2+ VIEW; RIGHT ELBOW - 2 VIEW; RIGHT HUMERUS - 2+ VIEW COMPARISON:  None Available. FINDINGS: Right shoulder: Internal and external views of the right shoulder are obtained. There is a comminuted spiral fracture of the proximal right humeral diaphysis, with varus angulation at the fracture site. Glenohumeral and acromioclavicular joints remain in anatomic alignment. Diffuse soft tissue swelling. Right humerus: Frontal and lateral views demonstrate comminuted spiral proximal right humeral diaphyseal fracture, with varus angulation at the fracture site. Alignment of the right shoulder and elbow remains anatomic. Diffuse soft tissue swelling. Right elbow: Frontal and lateral views are obtained. There are no acute displaced fractures. Alignment is anatomic. Mild osteoarthritis. Diffuse subcutaneous edema. IMPRESSION: 1. Comminuted displaced spiral fracture of the proximal right humeral diaphysis, with varus angulation at the fracture site. 2. Diffuse soft tissue swelling throughout the right upper extremity. Electronically Signed   By: Ozell Daring M.D.   On: 12/06/2023 22:58   DG Humerus Right Result Date: 12/06/2023 CLINICAL DATA:  Clemens, right arm pain EXAM: RIGHT SHOULDER - 2+ VIEW; RIGHT ELBOW - 2 VIEW; RIGHT HUMERUS - 2+ VIEW COMPARISON:  None Available.  FINDINGS: Right shoulder: Internal and external views of the right shoulder are obtained. There is a comminuted spiral fracture of the proximal right humeral diaphysis, with varus angulation at the fracture site. Glenohumeral and acromioclavicular joints remain in anatomic alignment. Diffuse soft tissue swelling. Right humerus: Frontal and lateral views demonstrate comminuted spiral proximal right humeral diaphyseal fracture, with varus angulation at the fracture site. Alignment of the right shoulder and elbow remains anatomic. Diffuse soft tissue swelling. Right elbow: Frontal and lateral views are obtained. There are no acute displaced fractures. Alignment is anatomic. Mild osteoarthritis. Diffuse subcutaneous edema. IMPRESSION: 1. Comminuted displaced spiral fracture of the proximal right humeral diaphysis, with varus angulation at the fracture site. 2. Diffuse soft tissue swelling throughout the right upper extremity. Electronically Signed   By: Ozell Daring M.D.   On: 12/06/2023 22:58   DG Elbow 2 Views Right Result Date: 12/06/2023 CLINICAL DATA:  Clemens, right arm pain EXAM: RIGHT SHOULDER - 2+ VIEW; RIGHT ELBOW - 2 VIEW; RIGHT HUMERUS - 2+ VIEW COMPARISON:  None Available. FINDINGS: Right shoulder: Internal and external views of the right shoulder are obtained. There is a comminuted spiral fracture of the proximal right humeral diaphysis, with varus angulation at the fracture site. Glenohumeral and acromioclavicular joints remain in anatomic alignment. Diffuse soft tissue swelling. Right humerus: Frontal and lateral views demonstrate comminuted spiral proximal right humeral diaphyseal fracture, with varus angulation at the fracture site. Alignment of the right shoulder and elbow remains anatomic. Diffuse soft tissue swelling. Right elbow: Frontal and lateral views are obtained. There are no acute displaced fractures. Alignment is anatomic. Mild osteoarthritis. Diffuse subcutaneous edema. IMPRESSION: 1.  Comminuted displaced spiral fracture of the proximal right humeral diaphysis, with varus angulation at the fracture site. 2. Diffuse soft tissue swelling throughout the right upper extremity. Electronically Signed   By: Ozell Daring M.D.   On: 12/06/2023 22:58    Microbiology: Results for orders placed or performed during the hospital encounter of 12/06/23  Urine Culture (for pregnant, neutropenic or urologic patients or patients with  an indwelling urinary catheter)     Status: Abnormal   Collection Time: 12/07/23  1:52 AM   Specimen: Urine, Clean Catch  Result Value Ref Range Status   Specimen Description URINE, CLEAN CATCH  Final   Special Requests   Final    NONE Performed at [facility], [address] [place], [place] [address]    Culture >=100,000 COLONIES/mL KLEBSIELLA PNEUMONIAE (A)  Final   Report Status 12/09/2023 FINAL  Final   Organism ID, Bacteria KLEBSIELLA PNEUMONIAE (A)  Final      Susceptibility   Klebsiella pneumoniae - MIC*    AMPICILLIN >=32 RESISTANT Resistant     CEFAZOLIN (URINE) Value in next row Sensitive      2 SENSITIVEThis is a modified FDA-approved test that has been validated and its performance characteristics determined by the reporting laboratory.  This laboratory is certified under the Clinical Laboratory Improvement Amendments CLIA as qualified to perform high complexity clinical laboratory testing.    CEFEPIME Value in next row Sensitive      2 SENSITIVEThis is a modified FDA-approved test that has been validated and its performance characteristics determined by the reporting laboratory.  This laboratory is certified under the Clinical Laboratory Improvement Amendments CLIA as qualified to perform high complexity clinical laboratory testing.    ERTAPENEM Value in next row Sensitive      2 SENSITIVEThis is a modified FDA-approved test that has been validated and its performance characteristics determined by the reporting laboratory.  This  laboratory is certified under the Clinical Laboratory Improvement Amendments CLIA as qualified to perform high complexity clinical laboratory testing.    CEFTRIAXONE  Value in next row Sensitive      2 SENSITIVEThis is a modified FDA-approved test that has been validated and its performance characteristics determined by the reporting laboratory.  This laboratory is certified under the Clinical Laboratory Improvement Amendments CLIA as qualified to perform high complexity clinical laboratory testing.    CIPROFLOXACIN Value in next row Sensitive      2 SENSITIVEThis is a modified FDA-approved test that has been validated and its performance characteristics determined by the reporting laboratory.  This laboratory is certified under the Clinical Laboratory Improvement Amendments CLIA as qualified to perform high complexity clinical laboratory testing.    GENTAMICIN  Value in next row Sensitive      2 SENSITIVEThis is a modified FDA-approved test that has been validated and its performance characteristics determined by the reporting laboratory.  This laboratory is certified under the Clinical Laboratory Improvement Amendments CLIA as qualified to perform high complexity clinical laboratory testing.    NITROFURANTOIN  Value in next row Intermediate      2 SENSITIVEThis is a modified FDA-approved test that has been validated and its performance characteristics determined by the reporting laboratory.  This laboratory is certified under the Clinical Laboratory Improvement Amendments CLIA as qualified to perform high complexity clinical laboratory testing.    TRIMETH/SULFA Value in next row Sensitive      2 SENSITIVEThis is a modified FDA-approved test that has been validated and its performance characteristics determined by the reporting laboratory.  This laboratory is certified under the Clinical Laboratory Improvement Amendments CLIA as qualified to perform high complexity clinical laboratory testing.     AMPICILLIN/SULBACTAM Value in next row Sensitive      2 SENSITIVEThis is a modified FDA-approved test that has been validated and its performance characteristics determined by the reporting laboratory.  This laboratory is certified under the Clinical Laboratory Improvement Amendments  CLIA as qualified to perform high complexity clinical laboratory testing.    PIP/TAZO Value in next row Sensitive ug/mL     <=4 SENSITIVEThis is a modified FDA-approved test that has been validated and its performance characteristics determined by the reporting laboratory.  This laboratory is certified under the Clinical Laboratory Improvement Amendments CLIA as qualified to perform high complexity clinical laboratory testing.    MEROPENEM  Value in next row Sensitive      <=4 SENSITIVEThis is a modified FDA-approved test that has been validated and its performance characteristics determined by the reporting laboratory.  This laboratory is certified under the Clinical Laboratory Improvement Amendments CLIA as qualified to perform high complexity clinical laboratory testing.    * >=100,000 COLONIES/mL KLEBSIELLA PNEUMONIAE    Labs: CBC: Recent Labs  Lab 12/06/23 2212 12/07/23 0907 12/09/23 1426 12/10/23 0519 12/11/23 0355 12/12/23 0558 12/13/23 0518  WBC 13.8*   < > 10.2 10.2 8.7 9.3 8.3  NEUTROABS 11.4*  --  9.0*  --   --   --   --   HGB 8.2*   < > 10.1* 8.8* 8.7* 9.3* 9.0*  HCT 25.8*   < > 31.3* 27.2* 27.4* 29.3* 28.9*  MCV 92.5   < > 88.7 87.7 90.1 89.1 90.9  PLT 256   < > 207 227 233 268 255   < > = values in this interval not displayed.   Basic Metabolic Panel: Recent Labs  Lab 12/06/23 2212 12/08/23 0405 12/10/23 0519  NA 138 138 138  K 4.4 3.8 4.1  CL 101 104 106  CO2 24 26 25   GLUCOSE 121* 98 122*  BUN 36* 21 15  CREATININE 1.50* 0.91 0.88  CALCIUM  8.7* 8.1* 8.1*   Liver Function Tests: Recent Labs  Lab 12/06/23 2212 12/08/23 0405  AST 71* 34  ALT 37 27  ALKPHOS 52 42  BILITOT  1.6* 1.8*  PROT 6.6 5.4*  ALBUMIN  3.7 2.8*   CBG: No results for input(s): GLUCAP in the last 168 hours.  Discharge time spent: 37 minutes.  Signed: Concepcion Riser, MD [facility] 12/13/2023

## 2023-12-16 ENCOUNTER — Ambulatory Visit: Admitting: Internal Medicine

## 2023-12-16 DIAGNOSIS — S42351D Displaced comminuted fracture of shaft of humerus, right arm, subsequent encounter for fracture with routine healing: Secondary | ICD-10-CM | POA: Diagnosis not present

## 2023-12-16 DIAGNOSIS — I700 Atherosclerosis of aorta: Secondary | ICD-10-CM

## 2023-12-16 DIAGNOSIS — I4892 Unspecified atrial flutter: Secondary | ICD-10-CM

## 2023-12-16 DIAGNOSIS — K449 Diaphragmatic hernia without obstruction or gangrene: Secondary | ICD-10-CM | POA: Diagnosis not present

## 2023-12-16 DIAGNOSIS — I110 Hypertensive heart disease with heart failure: Secondary | ICD-10-CM | POA: Diagnosis not present

## 2023-12-16 DIAGNOSIS — I5032 Chronic diastolic (congestive) heart failure: Secondary | ICD-10-CM | POA: Diagnosis not present

## 2023-12-16 DIAGNOSIS — I350 Nonrheumatic aortic (valve) stenosis: Secondary | ICD-10-CM

## 2023-12-16 DIAGNOSIS — I2510 Atherosclerotic heart disease of native coronary artery without angina pectoris: Secondary | ICD-10-CM | POA: Diagnosis not present

## 2023-12-16 DIAGNOSIS — E785 Hyperlipidemia, unspecified: Secondary | ICD-10-CM

## 2023-12-16 DIAGNOSIS — G9332 Myalgic encephalomyelitis/chronic fatigue syndrome: Secondary | ICD-10-CM | POA: Diagnosis not present

## 2023-12-16 DIAGNOSIS — F411 Generalized anxiety disorder: Secondary | ICD-10-CM | POA: Diagnosis not present

## 2023-12-16 DIAGNOSIS — M545 Low back pain, unspecified: Secondary | ICD-10-CM | POA: Diagnosis not present

## 2023-12-16 DIAGNOSIS — N1831 Chronic kidney disease, stage 3a: Secondary | ICD-10-CM

## 2023-12-16 DIAGNOSIS — D509 Iron deficiency anemia, unspecified: Secondary | ICD-10-CM | POA: Diagnosis not present

## 2023-12-16 DIAGNOSIS — D6869 Other thrombophilia: Secondary | ICD-10-CM

## 2023-12-16 DIAGNOSIS — E538 Deficiency of other specified B group vitamins: Secondary | ICD-10-CM | POA: Diagnosis not present

## 2023-12-16 DIAGNOSIS — I25118 Atherosclerotic heart disease of native coronary artery with other forms of angina pectoris: Secondary | ICD-10-CM

## 2023-12-16 DIAGNOSIS — Z9181 History of falling: Secondary | ICD-10-CM | POA: Diagnosis not present

## 2023-12-17 ENCOUNTER — Ambulatory Visit: Admitting: Physician Assistant

## 2023-12-18 DIAGNOSIS — F411 Generalized anxiety disorder: Secondary | ICD-10-CM | POA: Diagnosis not present

## 2023-12-18 DIAGNOSIS — N3010 Interstitial cystitis (chronic) without hematuria: Secondary | ICD-10-CM | POA: Diagnosis not present

## 2023-12-18 DIAGNOSIS — D509 Iron deficiency anemia, unspecified: Secondary | ICD-10-CM | POA: Diagnosis not present

## 2023-12-18 DIAGNOSIS — R2681 Unsteadiness on feet: Secondary | ICD-10-CM | POA: Diagnosis not present

## 2023-12-18 DIAGNOSIS — I5032 Chronic diastolic (congestive) heart failure: Secondary | ICD-10-CM | POA: Diagnosis not present

## 2023-12-18 DIAGNOSIS — H8110 Benign paroxysmal vertigo, unspecified ear: Secondary | ICD-10-CM | POA: Diagnosis not present

## 2023-12-18 DIAGNOSIS — Z79899 Other long term (current) drug therapy: Secondary | ICD-10-CM | POA: Diagnosis not present

## 2023-12-18 DIAGNOSIS — Z7189 Other specified counseling: Secondary | ICD-10-CM | POA: Diagnosis not present

## 2023-12-18 DIAGNOSIS — G629 Polyneuropathy, unspecified: Secondary | ICD-10-CM | POA: Diagnosis not present

## 2023-12-18 DIAGNOSIS — K219 Gastro-esophageal reflux disease without esophagitis: Secondary | ICD-10-CM | POA: Diagnosis not present

## 2023-12-18 DIAGNOSIS — M5410 Radiculopathy, site unspecified: Secondary | ICD-10-CM | POA: Diagnosis not present

## 2023-12-18 DIAGNOSIS — G609 Hereditary and idiopathic neuropathy, unspecified: Secondary | ICD-10-CM | POA: Diagnosis not present

## 2023-12-18 DIAGNOSIS — M4986 Spondylopathy in diseases classified elsewhere, lumbar region: Secondary | ICD-10-CM | POA: Diagnosis not present

## 2023-12-18 DIAGNOSIS — S42351D Displaced comminuted fracture of shaft of humerus, right arm, subsequent encounter for fracture with routine healing: Secondary | ICD-10-CM | POA: Diagnosis not present

## 2023-12-19 ENCOUNTER — Ambulatory Visit: Admitting: Internal Medicine

## 2023-12-23 DIAGNOSIS — F4321 Adjustment disorder with depressed mood: Secondary | ICD-10-CM | POA: Diagnosis not present

## 2023-12-23 DIAGNOSIS — F411 Generalized anxiety disorder: Secondary | ICD-10-CM | POA: Diagnosis not present

## 2023-12-24 NOTE — Telephone Encounter (Signed)
 Patient called wanted  to let you know that she was in the hospital, now she is in rehab and will keep you updated. Please Advise

## 2023-12-25 DIAGNOSIS — S42211D Unspecified displaced fracture of surgical neck of right humerus, subsequent encounter for fracture with routine healing: Secondary | ICD-10-CM | POA: Diagnosis not present

## 2023-12-25 DIAGNOSIS — G609 Hereditary and idiopathic neuropathy, unspecified: Secondary | ICD-10-CM | POA: Diagnosis not present

## 2023-12-25 DIAGNOSIS — M4986 Spondylopathy in diseases classified elsewhere, lumbar region: Secondary | ICD-10-CM | POA: Diagnosis not present

## 2023-12-25 DIAGNOSIS — S42351D Displaced comminuted fracture of shaft of humerus, right arm, subsequent encounter for fracture with routine healing: Secondary | ICD-10-CM | POA: Diagnosis not present

## 2023-12-25 DIAGNOSIS — R2681 Unsteadiness on feet: Secondary | ICD-10-CM | POA: Diagnosis not present

## 2023-12-25 DIAGNOSIS — H8110 Benign paroxysmal vertigo, unspecified ear: Secondary | ICD-10-CM | POA: Diagnosis not present

## 2023-12-26 DIAGNOSIS — J309 Allergic rhinitis, unspecified: Secondary | ICD-10-CM | POA: Diagnosis not present

## 2023-12-26 DIAGNOSIS — I4891 Unspecified atrial fibrillation: Secondary | ICD-10-CM | POA: Diagnosis not present

## 2023-12-26 DIAGNOSIS — D62 Acute posthemorrhagic anemia: Secondary | ICD-10-CM | POA: Diagnosis not present

## 2023-12-26 DIAGNOSIS — N33 Bladder disorders in diseases classified elsewhere: Secondary | ICD-10-CM | POA: Diagnosis not present

## 2023-12-26 DIAGNOSIS — S42351D Displaced comminuted fracture of shaft of humerus, right arm, subsequent encounter for fracture with routine healing: Secondary | ICD-10-CM | POA: Diagnosis not present

## 2023-12-26 DIAGNOSIS — Z7901 Long term (current) use of anticoagulants: Secondary | ICD-10-CM | POA: Diagnosis not present

## 2023-12-26 DIAGNOSIS — I4892 Unspecified atrial flutter: Secondary | ICD-10-CM | POA: Diagnosis not present

## 2023-12-26 DIAGNOSIS — Z9181 History of falling: Secondary | ICD-10-CM | POA: Diagnosis not present

## 2023-12-27 NOTE — Progress Notes (Signed)
 Patient ID: Jennifer [name] MRN: [id number] DOB/AGE: June 12, 1945 78 y.o.  Primary Care Physician:Wharton, Charmaine, [place] Primary Cardiologist: Santo  CC:  Aortic valvular disease management     FOCUSED PROBLEM LIST:   Aortic stenosis AVA 0.89, MG 35, Vmax 3.7, SVI 65, EF 60-65% TTE 2024 EKG SR without conduction defects LVH No amyloid or HOCM CMR 2024  Atrial flutter On Eliquis  CAD 90% dLAD, 50%pLAD cath 2022 Hyperlipidemia Aortic atherosclerosis Coronary CTA 2022 Hypertension CKDIIIA Chronic debilitating back pain BMI 37/BSA 2.01 January 2024: Patient consents to use of AI scribe. The patient is a 4F with the above listed medical problems referred for recommendations regarding her aortic stenosis.  She has been managed by Dr. Santo.  She underwent coronary angiography which demonstrated a high grade lesion of the distal LAD and given tortuosity of the vessel, medical management was pursued.  She did undergo CMR imaging and amyloid was excluded.  Her last echocardiogram demonstrated progression of her aortic stenosis.  She was scheduled to see me earlier however she ended up falling and experiencing a fracture of her proximal right humeral shaft.  She underwent surgical repair and was discharged home.  On September 3rd, she experienced a fall after being startled awake by a knock on her door. She jumped up quickly, lost her balance, and fell, hitting her right shoulder and head against the wall. She immediately felt pain in her right arm, with flailing and inability to move it properly, along with bruising and swelling on her face and arm. Despite the pain, she did not seek immediate medical attention and opted to rest overnight. The following day, significant pain and swelling persisted, prompting her to seek emergency care on September 5th.  In the emergency room, imaging studies, including a CT scan and x-rays, revealed a three-way fracture of her right  humerus. She underwent surgery on September 8th, where a plate and screws were placed to stabilize the fracture. Blood transfusions were administered during her hospital stay. She takes Eliquis , a blood thinner, for her coronary artery disease. She was discharged to a rehabilitation facility on September 12th, where she is currently undergoing physical therapy to regain strength and mobility in her arm.  She has a history of lumbar stenosis with sciatica, which has previously caused significant pain and led to the cancellation of appointments. She also has a history of coronary artery disease, diagnosed in 2022, and experiences occasional shortness of breath and palpitations. She feels winded when walking long distances or climbing stairs and has experienced episodes of chest pain, for which she occasionally takes nitroglycerin . She is on multiple medications for her heart condition, including losartan .  She lives alone and has no family nearby. She has been experiencing some weakness and deconditioning following her fall and subsequent hospitalization, which she attributes to prolonged bed rest and the effects of her injuries and treatments. She is actively participating in rehabilitation to improve her strength and mobility.  Her past medical history includes episodes of vertigo in 2018 and 2019, for which she underwent physical therapy. She also has a history of esophageal reflux disease, which she manages with medication. She has been experiencing lightheadedness upon standing quickly but denies loss of consciousness or fainting during the fall. No current shortness of breath during rehabilitation exercises. Occasional chest pain and palpitations.    No dental complaints.           Past Medical History:  Diagnosis Date   Acute meniscal tear of left knee  FOLLOWED BY DR GIAFFREY   Anemia    Aortic stenosis    Ascending aorta dilation    Atrial flutter (HCC)    AV block, Mobitz 1     BPPV (benign paroxysmal positional vertigo)    CAD (coronary artery disease)    Chronic bladder pain    Chronic fatigue syndrome    Chronic low back pain    W/ RIGHT LEG PAIN AND NUMBNESS   Cystitis, chronic    Fibromyalgia    GERD (gastroesophageal reflux disease)    Hiatal hernia    Hypertension    IC (interstitial cystitis)    LBBB (left bundle branch block)    transient hx   OSA (obstructive sleep apnea)    per pt study yrs ago-- moderate osa ,  intolerant cpap   Pinched vertebral nerve    bilateral L2 -- L3 and L3 -- L4-/  epidural injection's treatment , PT and pain clince   PONV (postoperative nausea and vomiting)    severe   PVC's (premature ventricular contractions)    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    Self-catheterizes urinary bladder    Spinal stenosis, lumbar region with neurogenic claudication    Wears glasses    Wears partial dentures    upper and lower    Past Surgical History:  Procedure Laterality Date   ABDOMINAL AORTOGRAM N/A 09/16/2020   Procedure: ABDOMINAL AORTOGRAM;  Surgeon: Darron Deatrice LABOR, MD;  Location: [facility];  Service: Cardiovascular;  Laterality: N/A;   ABDOMINAL HYSTERECTOMY  1980   w/ Left Salpingoophorectomy   BIOPSY  08/27/2021   Procedure: BIOPSY;  Surgeon: Rosalie Kitchens, MD;  Location: [facility] ENDOSCOPY;  Service: Gastroenterology;;   CARDIAC CATHETERIZATION  08-21-2001  dr victory sharps   normal coronary arteries and LVF   CARPAL TUNNEL RELEASE Bilateral 1986   CECALCYSTOPLASTY/  APPENDECTOMY  1984   at [facility]   CHOLECYSTECTOMY  1992   CYSTO WITH HYDRODISTENSION N/A 01/12/2016   Procedure: CYSTOSCOPY/HYDRODISTENSION;  Surgeon: Arlena Gal, MD;  Location: [facility];  Service: Urology;  Laterality: N/A;   CYSTO WITH HYDRODISTENSION N/A 08/30/2016   Procedure: CYSTOSCOPY/HYDRODISTENSION OF BLADDER INJECTION OF MARCAINE /PYRIDIUM ;  Surgeon: Gal Arlena, MD;   Location: [facility];  Service: Urology;  Laterality: N/A;   ESOPHAGOGASTRODUODENOSCOPY (EGD) WITH PROPOFOL  N/A 08/27/2021   Procedure: ESOPHAGOGASTRODUODENOSCOPY (EGD) WITH PROPOFOL ;  Surgeon: Rosalie Kitchens, MD;  Location: [facility] ENDOSCOPY;  Service: Gastroenterology;  Laterality: N/A;   LEFT HEART CATH AND CORONARY ANGIOGRAPHY N/A 09/16/2020   Procedure: LEFT HEART CATH AND CORONARY ANGIOGRAPHY;  Surgeon: Darron Deatrice LABOR, MD;  Location: [facility];  Service: Cardiovascular;  Laterality: N/A;   MULTIPLE CYSTO/  HYDRODISTENTION/  INSTILLATION THERAPY  last one 08-19-2009   NASAL SINUS SURGERY  1987   ORIF HUMERUS FRACTURE Right 12/09/2023   Procedure: OPEN REDUCTION INTERNAL FIXATION (ORIF) HUMERAL SHAFT FRACTURE;  Surgeon: Celena Sharper, MD;  Location: [facility] OR;  Service: Orthopedics;  Laterality: Right;   TONSILLECTOMY  as child   VAGINAL GROWTH REMOVED  as teen    Family History  Problem Relation Age of Onset   CAD Father    Asthma Father    Hypertension Father    Alzheimer's disease Father    Emphysema Father    COPD Father    Heart failure Mother    Peripheral vascular disease Mother    Stroke Mother  Hypertension Mother    Colonic polyp Mother    Cancer Brother        melanoma    Social History   Socioeconomic History   Marital status: Single    Spouse name: none   Number of children: 0   Years of education: College   Highest education level: Not on file  Occupational History   Occupation: n/a  Tobacco Use   Smoking status: Never   Smokeless tobacco: Never  Vaping Use   Vaping status: Never Used  Substance and Sexual Activity   Alcohol use: No    Alcohol/week: 0.0 standard drinks of alcohol   Drug use: No   Sexual activity: Not on file  Other Topics Concern   Not on file  Social History Narrative   Lives alone   Alyse Luke Kerns, RN    Caffeine use: 2 glasses tea/day   Brother   Mother passed in ?45 at 86, father passed in 59's in 2006    She was a Runner, broadcasting/film/video      Social Drivers of Corporate investment banker Strain: Low Risk  (03/22/2022)   Overall Financial Resource Strain (CARDIA)    Difficulty of Paying Living Expenses: Not very hard  Food Insecurity: No Food Insecurity (12/07/2023)   Hunger Vital Sign    Worried About Running Out of Food in the Last Year: Never true    Ran Out of Food in the Last Year: Never true  Transportation Needs: No Transportation Needs (12/07/2023)   PRAPARE - Administrator, Civil Service (Medical): No    Lack of Transportation (Non-Medical): No  Physical Activity: Not on file  Stress: No Stress Concern Present (03/22/2022)   [employer] of Occupational Health - Occupational Stress Questionnaire    Feeling of Stress : Only a little  Social Connections: Moderately Integrated (12/07/2023)   Social Connection and Isolation Panel    Frequency of Communication with Friends and Family: Twice a week    Frequency of Social Gatherings with Friends and Family: Twice a week    Attends Religious Services: 1 to 4 times per year    Active Member of [employer] or Organizations: No    Attends Banker Meetings: 1 to 4 times per year    Marital Status: Never married  Intimate Partner Violence: Not At Risk (12/07/2023)   Humiliation, Afraid, Rape, and Kick questionnaire    Fear of Current or Ex-Partner: No    Emotionally Abused: No    Physically Abused: No    Sexually Abused: No     Prior to Admission medications   Medication Sig Start Date End Date Taking? Authorizing Provider  acetaminophen  (TYLENOL ) 500 MG tablet Take 500 mg by mouth 2 (two) times daily as needed (for pain).    [provider]  aluminum hydroxide-magnesium  carbonate (GAVISCON) 95-358 MG/15ML SUSP Take by mouth as needed for indigestion or heartburn. 01/23/17   [provider]  apixaban  (ELIQUIS ) 5 MG TABS tablet Take 1 tablet (5 mg total) by mouth 2 (two) times daily. 09/11/23   Santo Stanly LABOR, MD  cyanocobalamin  (VITAMIN B12) 1000 MCG tablet Take 1,000 mcg by mouth daily. Patient not taking: Reported on 12/07/2023 07/12/22   [provider]  dextromethorphan -guaiFENesin  (MUCINEX  DM) 30-600 MG 12hr tablet Take 1 tablet by mouth every 12 (twelve) hours as needed for cough.    [provider]  ezetimibe  (ZETIA ) 10 MG tablet Take 1 tablet (10 mg total) by mouth  daily. 06/10/23   Santo Stanly LABOR, MD  fexofenadine (ALLEGRA) 60 MG tablet Take 60 mg by mouth daily as needed for allergies. 01/06/14   [provider]  fluconazole  (DIFLUCAN ) 100 MG tablet Take 100 mg by mouth daily. Patient not taking: Reported on 12/07/2023 05/14/23   [provider]  furosemide  (LASIX ) 40 MG tablet May take an extra dose once daily as needed for swelling 05/27/23   Chandrasekhar, Mahesh A, MD  gabapentin  (NEURONTIN ) 300 MG capsule Take 300 mg by mouth at bedtime. 07/28/21   [provider]  hydrALAZINE  (APRESOLINE ) 50 MG tablet TAKE 1 TABLET BY MOUTH THREE TIMES A DAY 06/07/23   Chandrasekhar, Mahesh A, MD  HYDROcodone -acetaminophen  (NORCO) 7.5-325 MG tablet Take 1 tablet by mouth 3 (three) times daily as needed.    [provider]  HYDROcodone -acetaminophen  (NORCO) 7.5-325 MG tablet Take 1 tablet by mouth every 6 (six) hours as needed for moderate pain (pain score 4-6). 12/12/23   Deward Eck, PA-C  hydrocortisone  (ANUSOL -HC) 25 MG suppository Place 25 mg rectally 2 (two) times daily as needed for hemorrhoids. Patient not taking: Reported on 12/07/2023 12/07/20   [provider]  isosorbide  dinitrate (ISORDIL ) 10 MG tablet TAKE 1 TABLET BY MOUTH THREE TIMES A DAY 06/07/23   Chandrasekhar, Mahesh A, MD  losartan  (COZAAR ) 100 MG tablet TAKE 1 TABLET BY MOUTH EVERY DAY 08/15/23   Chandrasekhar, Mahesh A, MD  meclizine  (ANTIVERT ) 25 MG tablet Take 25 mg by mouth 3 (three) times daily as needed for dizziness.    [provider]  Methen-Hyosc-Meth  Blue-Na Phos (UROGESIC-BLUE) 81.6 MG TABS Take 1 tablet by mouth every 6 (six) hours as needed (urinary burning). 08/21/21   [provider]  metoprolol  tartrate (LOPRESSOR ) 25 MG tablet TAKE 1 TABLET BY MOUTH TWICE A DAY 08/15/23   Chandrasekhar, Mahesh A, MD  nitroGLYCERIN  (NITROSTAT ) 0.4 MG SL tablet PLACE 1 TABLET UNDER THE TONGE EVERY 5 MINUTES AS NEEDED FOR CHEST PAIN *TAKE UP TO 3 TABLETS* 02/21/23   Chandrasekhar, Mahesh A, MD  nystatin  cream (MYCOSTATIN ) Apply topically 2 (two) times daily. 08/04/20   Eben Reyes BROCKS, MD  ondansetron  (ZOFRAN -ODT) 4 MG disintegrating tablet Take 4 mg by mouth every 8 (eight) hours as needed for nausea (dissolve orally). 07/01/17   [provider]  pantoprazole  (PROTONIX ) 40 MG tablet Take 40 mg by mouth 2 (two) times daily. 04/15/23   [provider]  polyethylene glycol powder (GLYCOLAX/MIRALAX) 17 GM/SCOOP powder Take 17 g by mouth daily as needed for mild constipation.    [provider]  rosuvastatin  (CRESTOR ) 5 MG tablet Take 1 tablet (5 mg total) by mouth 3 (three) times a week. 08/03/22 05/27/23  Santo Stanly LABOR, MD  senna (SENOKOT) 8.6 MG tablet Take 1 tablet by mouth as needed for constipation. Patient not taking: Reported on 12/07/2023 07/28/21   [provider]  spironolactone  (ALDACTONE ) 25 MG tablet Take 1 tablet (25 mg total) by mouth daily. 09/17/23   Santo Stanly LABOR, MD  sucralfate  (CARAFATE ) 1 g tablet Take 1 tablet (1 g total) by mouth 4 (four) times daily -  with meals and at bedtime. Patient taking differently: Take 1 g by mouth daily at 6 (six) AM. 1 tab in am, 1 tab in pm 08/28/21   Patsy Lenis, MD  triamcinolone  cream (KENALOG ) 0.1 % Apply 1 application. topically 2 (two) times daily as needed (rash). Patient not taking: Reported on 12/07/2023 04/06/21   [provider]  Allergies  Allergen Reactions   Codeine Hives, Nausea And Vomiting and Rash   Dimethyl Sulfoxide Hives  and Itching   Macrodantin  [Nitrofurantoin  Macrocrystal] Itching, Nausea And Vomiting and Rash    Abdominal and chest pain, also- blood-red skin, also   Moxifloxacin Hives, Shortness Of Breath, Swelling and Rash    Rash was severe, caused tremors, ans skin became BLOODY RED   Nitrofurantoin  Diarrhea, Itching, Rash and Other (See Comments)    GI/stomach upset, sweating, chest pain, and skin turned bloody red   Quinolones Anaphylaxis   Sulfa Antibiotics Hives and Rash   Tape Rash   Topiramate Itching   Furosemide  Other (See Comments)    CANNOT TAKE DUE TO Interstitial cystitis   Duloxetine Hcl Nausea Only and Other (See Comments)    Vertigo and heart racing also    Ranexa  [Ranolazine ]     Constipation/heart pounding   Robaxin [Methocarbamol] Other (See Comments)    Make me feel flu-like symptoms    REVIEW OF SYSTEMS:  General: no fevers/chills/night sweats Eyes: no blurry vision, diplopia, or amaurosis ENT: no sore throat or hearing loss Resp: no cough, wheezing, or hemoptysis CV: no edema or palpitations GI: no abdominal pain, nausea, vomiting, diarrhea, or constipation GU: no dysuria, frequency, or hematuria Skin: no rash Neuro: no headache, numbness, tingling, or weakness of extremities Musculoskeletal: no joint pain or swelling Heme: no bleeding, DVT, or easy bruising Endo: no polydipsia or polyuria  BP (!) 110/50 (BP Location: Left Arm, Patient Position: Sitting, Cuff Size: Large)   Pulse (!) 56   Ht 5' 2.5 (1.588 m)   Wt 208 lb 9.6 oz (94.6 kg)   SpO2 93%   BMI 37.55 kg/m   PHYSICAL EXAM: GEN:  AO x 3 in no acute distress HEENT: normal Dentition: Normal Neck: JVP normal. +2 carotid upstrokes without bruits. No thyromegaly. Lungs: equal expansion, clear bilaterally CV: Apex is discrete and nondisplaced, RRR with 3/6 SEM Abd: soft, non-tender, non-distended; no bruit; positive bowel sounds Ext: no edema, ecchymoses, or cyanosis Vascular: 2+ femoral pulses, 2+  radial pulses       Skin: warm and dry without rash Neuro: CN II-XII grossly intact; motor and sensory grossly intact    DATA AND STUDIES:  EKG: 2025 sinus rhythm nonspecific ST and T wave abnormality  EKG Interpretation Date/Time:    Ventricular Rate:    PR Interval:    QRS Duration:    QT Interval:    QTC Calculation:   R Axis:      Text Interpretation:          CARDIAC STUDIES: Refer to CV Procedures and Imaging Tabs  12/08/2023: ALT 27 12/10/2023: BUN 15; Creatinine, Ser 0.88; Potassium 4.1; Sodium 138 12/13/2023: Hemoglobin 9.0; Platelets 255   STS RISK CALCULATOR: Pending  NHYA CLASS: 2    ASSESSMENT AND PLAN:   1. Nonrheumatic aortic valve stenosis   2. Atrial flutter, unspecified type (HCC)   3. Coronary artery disease of native artery of native heart with stable angina pectoris   4. Hyperlipidemia LDL goal <70   5. Aortic atherosclerosis   6. Primary hypertension   7. Stage 3a chronic kidney disease (HCC)   8. BMI 37.0-37.9, adult   9. Palpitations     Aortic stenosis: Patient has developed NYHA class II symptoms of shortness of breath and fatigue.  Is unclear to me whether her fall was from a syncopal episode or not but I am very suspicious.  She is still rehabilitating  from her right arm fracture.  I will see her back in 4 weeks to discuss further.  I am hopeful she will be better rehabilitated at that point in time and we can start an evaluation including a TAVR protocol CTA and a coronary angiography assessment. Atrial flutter: Continue apixaban  5 mg twice daily, Lopressor  25 mg twice daily.  Due to palpitations will obtain monitor to evaluate further. CAD: Continue Eliquis  5 mg twice daily, rosuvastatin  5 mg Hyperlipidemia: Continue rosuvastatin  5 mg Aortic atherosclerosis: Continue Eliquis  5 mg twice daily, rosuvastatin  5 mg Hypertension: Continue hydralazine  50 mg 3 times daily, spironolactone  25 mg daily.  BP is well-controlled today because it is  low we will decrease losartan  to 50 mg CKD stage IIIa: Continue losartan  but decrease to 50 mg Elevated BMI: Continue diet and exercise modification Palpitations: Obtain monitor as above.   I have personally reviewed the patients imaging data as summarized above.  I have reviewed the natural history of aortic stenosis with the patient and family members who are present today. We have discussed the limitations of medical therapy and the poor prognosis associated with symptomatic aortic stenosis. We have also reviewed potential treatment options, including palliative medical therapy, conventional surgical aortic valve replacement, and transcatheter aortic valve replacement. We discussed treatment options in the context of this patient's specific comorbid medical conditions.   All of the patient's questions were answered today. Will make further recommendations based on the results of studies outlined above.   I spent 48 minutes reviewing all clinical data during and prior to this visit including all relevant imaging studies, laboratories, clinical information from other health systems and prior notes from both Cardiology and other specialties, interviewing the patient, conducting a complete physical examination, and coordinating care in order to formulate a comprehensive and personalized evaluation and treatment plan.   Jennifer Keeven K Carmencita Cusic, MD  01/01/2024 2:51 PM    [facility] [address] [place], [place], [place]  [address] Phone: [phone]; Fax: [phone]

## 2023-12-30 DIAGNOSIS — G609 Hereditary and idiopathic neuropathy, unspecified: Secondary | ICD-10-CM | POA: Diagnosis not present

## 2023-12-30 DIAGNOSIS — R2681 Unsteadiness on feet: Secondary | ICD-10-CM | POA: Diagnosis not present

## 2023-12-30 DIAGNOSIS — S42351D Displaced comminuted fracture of shaft of humerus, right arm, subsequent encounter for fracture with routine healing: Secondary | ICD-10-CM | POA: Diagnosis not present

## 2023-12-30 DIAGNOSIS — H8110 Benign paroxysmal vertigo, unspecified ear: Secondary | ICD-10-CM | POA: Diagnosis not present

## 2023-12-30 DIAGNOSIS — M4986 Spondylopathy in diseases classified elsewhere, lumbar region: Secondary | ICD-10-CM | POA: Diagnosis not present

## 2023-12-30 DIAGNOSIS — R350 Frequency of micturition: Secondary | ICD-10-CM | POA: Diagnosis not present

## 2024-01-01 ENCOUNTER — Ambulatory Visit: Attending: Internal Medicine | Admitting: Internal Medicine

## 2024-01-01 ENCOUNTER — Encounter: Payer: Self-pay | Admitting: Internal Medicine

## 2024-01-01 ENCOUNTER — Other Ambulatory Visit: Payer: Self-pay | Admitting: Internal Medicine

## 2024-01-01 ENCOUNTER — Ambulatory Visit

## 2024-01-01 VITALS — BP 110/50 | HR 56 | Ht 62.5 in | Wt 208.6 lb

## 2024-01-01 DIAGNOSIS — R002 Palpitations: Secondary | ICD-10-CM | POA: Diagnosis not present

## 2024-01-01 DIAGNOSIS — I4892 Unspecified atrial flutter: Secondary | ICD-10-CM

## 2024-01-01 DIAGNOSIS — R2681 Unsteadiness on feet: Secondary | ICD-10-CM | POA: Diagnosis not present

## 2024-01-01 DIAGNOSIS — N1831 Chronic kidney disease, stage 3a: Secondary | ICD-10-CM

## 2024-01-01 DIAGNOSIS — I700 Atherosclerosis of aorta: Secondary | ICD-10-CM

## 2024-01-01 DIAGNOSIS — M4986 Spondylopathy in diseases classified elsewhere, lumbar region: Secondary | ICD-10-CM | POA: Diagnosis not present

## 2024-01-01 DIAGNOSIS — I25118 Atherosclerotic heart disease of native coronary artery with other forms of angina pectoris: Secondary | ICD-10-CM

## 2024-01-01 DIAGNOSIS — I10 Essential (primary) hypertension: Secondary | ICD-10-CM | POA: Diagnosis not present

## 2024-01-01 DIAGNOSIS — H8110 Benign paroxysmal vertigo, unspecified ear: Secondary | ICD-10-CM | POA: Diagnosis not present

## 2024-01-01 DIAGNOSIS — R350 Frequency of micturition: Secondary | ICD-10-CM | POA: Diagnosis not present

## 2024-01-01 DIAGNOSIS — Z6837 Body mass index (BMI) 37.0-37.9, adult: Secondary | ICD-10-CM | POA: Insufficient documentation

## 2024-01-01 DIAGNOSIS — S42351D Displaced comminuted fracture of shaft of humerus, right arm, subsequent encounter for fracture with routine healing: Secondary | ICD-10-CM | POA: Diagnosis not present

## 2024-01-01 DIAGNOSIS — E785 Hyperlipidemia, unspecified: Secondary | ICD-10-CM | POA: Diagnosis not present

## 2024-01-01 DIAGNOSIS — I350 Nonrheumatic aortic (valve) stenosis: Secondary | ICD-10-CM

## 2024-01-01 DIAGNOSIS — G609 Hereditary and idiopathic neuropathy, unspecified: Secondary | ICD-10-CM | POA: Diagnosis not present

## 2024-01-01 MED ORDER — LOSARTAN POTASSIUM 50 MG PO TABS: 50.0000 mg | ORAL_TABLET | Freq: Every day | ORAL | 3 refills | Status: DC

## 2024-01-01 NOTE — Patient Instructions (Addendum)
 Medication Instructions:    Start Losartan   50 mg on 01/02/2024   Stop taking Losartan  100 mg on 01/02/2024  *If you need a refill on your cardiac medications before your next appointment, please call your pharmacy*   Lab Work: Not needed    Testing/Procedures: Your physician has recommended that you wear a holter monitor 14 days. Holter monitors are medical devices that record the heart's electrical activity. Doctors most often use these monitors to diagnose arrhythmias. Arrhythmias are problems with the speed or rhythm of the heartbeat. The monitor is a small, portable device. You can wear one while you do your normal daily activities. This is usually used to diagnose what is causing palpitations/syncope (passing out).     Monitor has been placed while at the office visit. [facility]  will mail monitor back to company once it is completed    Follow-Up: At [facility], you and your health needs are our priority.  As part of our continuing mission to provide you with exceptional heart care, we have created designated Provider Care Teams.  These Care Teams include your primary Cardiologist (physician) and Advanced Practice Providers (APPs -  Physician Assistants and Nurse Practitioners) who all work together to provide you with the care you need, when you need it.     Your next appointment:   4 month(s)  The format for your next appointment:   In Person  Provider:   Dr  Lurena Red   Other Instructions   ZIO XT- Long Term Monitor Instructions  Your physician has requested you wear a ZIO patch monitor for 14 days.  This is a single patch monitor. [employer] supplies one patch monitor per enrollment. Additional stickers are not available. Please do not apply patch if you will be having a Nuclear Stress Test,  Echocardiogram, Cardiac CT, MRI, or Chest Xray during the period you would be wearing the  monitor. The patch cannot be worn during these tests. You cannot remove and  re-apply the  ZIO XT patch monitor.  Your ZIO patch monitor will be mailed 3 day USPS to your address on file. It may take 3-5 days  to receive your monitor after you have been enrolled.  Once you have received your monitor, please review the enclosed instructions. Your monitor  has already been registered assigning a specific monitor serial # to you.  Billing and Patient Assistance Program Information  We have supplied [employer] with any of your insurance information on file for billing purposes. [employer] offers a sliding scale Patient Assistance Program for patients that do not have  insurance, or whose insurance does not completely cover the cost of the ZIO monitor.  You must apply for the Patient Assistance Program to qualify for this discounted rate.  To apply, please call [employer] at [phone], select option 4, select option 2, ask to apply for  Patient Assistance Program. [place] will ask your household income, and how many people  are in your household. They will quote your out-of-pocket cost based on that information.  [employer] will also be able to set up a 83-month, interest-free payment plan if needed.  Applying the monitor   Shave hair from upper left chest.  Hold abrader disc by orange tab. Rub abrader in 40 strokes over the upper left chest as  indicated in your monitor instructions.  Clean area with 4 enclosed alcohol pads. Let dry.  Apply patch as indicated in monitor instructions. Patch will be placed under collarbone on left  side  of chest with arrow pointing upward.  Rub patch adhesive wings for 2 minutes. Remove white label marked 1. Remove the white  label marked 2. Rub patch adhesive wings for 2 additional minutes.  While looking in a mirror, press and release button in center of patch. A small green light will  flash 3-4 times. This will be your only indicator that the monitor has been turned on.  Do not shower for the first 24 hours. You may shower after the  first 24 hours.  Press the button if you feel a symptom. You will hear a small click. Record Date, Time and  Symptom in the Patient Logbook.  When you are ready to remove the patch, follow instructions on the last 2 pages of Patient  Logbook. Stick patch monitor onto the last page of Patient Logbook.  Place Patient Logbook in the blue and white box. Use locking tab on box and tape box closed  securely. The blue and white box has prepaid postage on it. Please place it in the mailbox as  soon as possible. Your physician should have your test results approximately 7 days after the  monitor has been mailed back to [facility] [place].  Call [facility] Customer Care at [phone] if you have questions regarding  your ZIO XT patch monitor. Call them immediately if you see an orange light blinking on your  monitor.  If your monitor falls off in less than 4 days, contact our Monitor department at [phone].  If your monitor becomes loose or falls off after 4 days call [employer] at [phone] for  suggestions on securing your monitor

## 2024-01-01 NOTE — Progress Notes (Unsigned)
 ZIO serial # [id number] from office inventory applied to patient.

## 2024-01-02 DIAGNOSIS — Z1612 Extended spectrum beta lactamase (ESBL) resistance: Secondary | ICD-10-CM | POA: Diagnosis not present

## 2024-01-02 DIAGNOSIS — Z22358 Carrier of other enterobacterales: Secondary | ICD-10-CM | POA: Diagnosis not present

## 2024-01-02 DIAGNOSIS — N390 Urinary tract infection, site not specified: Secondary | ICD-10-CM | POA: Diagnosis not present

## 2024-01-02 DIAGNOSIS — N33 Bladder disorders in diseases classified elsewhere: Secondary | ICD-10-CM | POA: Diagnosis not present

## 2024-01-06 DIAGNOSIS — H8110 Benign paroxysmal vertigo, unspecified ear: Secondary | ICD-10-CM | POA: Diagnosis not present

## 2024-01-06 DIAGNOSIS — S42351D Displaced comminuted fracture of shaft of humerus, right arm, subsequent encounter for fracture with routine healing: Secondary | ICD-10-CM | POA: Diagnosis not present

## 2024-01-06 DIAGNOSIS — R2681 Unsteadiness on feet: Secondary | ICD-10-CM | POA: Diagnosis not present

## 2024-01-06 DIAGNOSIS — M4986 Spondylopathy in diseases classified elsewhere, lumbar region: Secondary | ICD-10-CM | POA: Diagnosis not present

## 2024-01-06 DIAGNOSIS — G609 Hereditary and idiopathic neuropathy, unspecified: Secondary | ICD-10-CM | POA: Diagnosis not present

## 2024-01-06 DIAGNOSIS — N390 Urinary tract infection, site not specified: Secondary | ICD-10-CM | POA: Diagnosis not present

## 2024-01-07 ENCOUNTER — Ambulatory Visit: Admitting: Neurology

## 2024-01-13 DIAGNOSIS — M4986 Spondylopathy in diseases classified elsewhere, lumbar region: Secondary | ICD-10-CM | POA: Diagnosis not present

## 2024-01-13 DIAGNOSIS — G609 Hereditary and idiopathic neuropathy, unspecified: Secondary | ICD-10-CM | POA: Diagnosis not present

## 2024-01-13 DIAGNOSIS — H8110 Benign paroxysmal vertigo, unspecified ear: Secondary | ICD-10-CM | POA: Diagnosis not present

## 2024-01-13 DIAGNOSIS — R2681 Unsteadiness on feet: Secondary | ICD-10-CM | POA: Diagnosis not present

## 2024-01-13 DIAGNOSIS — S42351D Displaced comminuted fracture of shaft of humerus, right arm, subsequent encounter for fracture with routine healing: Secondary | ICD-10-CM | POA: Diagnosis not present

## 2024-01-14 ENCOUNTER — Ambulatory Visit: Admitting: Physician Assistant

## 2024-01-15 ENCOUNTER — Other Ambulatory Visit: Payer: Self-pay | Admitting: Internal Medicine

## 2024-01-15 DIAGNOSIS — H8110 Benign paroxysmal vertigo, unspecified ear: Secondary | ICD-10-CM | POA: Diagnosis not present

## 2024-01-15 DIAGNOSIS — J189 Pneumonia, unspecified organism: Secondary | ICD-10-CM | POA: Diagnosis not present

## 2024-01-15 DIAGNOSIS — S42351D Displaced comminuted fracture of shaft of humerus, right arm, subsequent encounter for fracture with routine healing: Secondary | ICD-10-CM | POA: Diagnosis not present

## 2024-01-15 DIAGNOSIS — R2681 Unsteadiness on feet: Secondary | ICD-10-CM | POA: Diagnosis not present

## 2024-01-15 DIAGNOSIS — M4986 Spondylopathy in diseases classified elsewhere, lumbar region: Secondary | ICD-10-CM | POA: Diagnosis not present

## 2024-01-15 DIAGNOSIS — G609 Hereditary and idiopathic neuropathy, unspecified: Secondary | ICD-10-CM | POA: Diagnosis not present

## 2024-01-16 DIAGNOSIS — W19XXXD Unspecified fall, subsequent encounter: Secondary | ICD-10-CM | POA: Diagnosis not present

## 2024-01-16 DIAGNOSIS — Z7901 Long term (current) use of anticoagulants: Secondary | ICD-10-CM | POA: Diagnosis not present

## 2024-01-16 DIAGNOSIS — R918 Other nonspecific abnormal finding of lung field: Secondary | ICD-10-CM | POA: Diagnosis not present

## 2024-01-16 DIAGNOSIS — B372 Candidiasis of skin and nail: Secondary | ICD-10-CM | POA: Diagnosis not present

## 2024-01-16 DIAGNOSIS — M25511 Pain in right shoulder: Secondary | ICD-10-CM | POA: Diagnosis not present

## 2024-01-16 DIAGNOSIS — S42351D Displaced comminuted fracture of shaft of humerus, right arm, subsequent encounter for fracture with routine healing: Secondary | ICD-10-CM | POA: Diagnosis not present

## 2024-01-16 MED ORDER — NITROGLYCERIN 0.4 MG SL SUBL: 0.4000 mg | SUBLINGUAL_TABLET | SUBLINGUAL | 3 refills | Status: AC | PRN

## 2024-01-20 DIAGNOSIS — R2681 Unsteadiness on feet: Secondary | ICD-10-CM | POA: Diagnosis not present

## 2024-01-20 DIAGNOSIS — J189 Pneumonia, unspecified organism: Secondary | ICD-10-CM | POA: Diagnosis not present

## 2024-01-20 DIAGNOSIS — G609 Hereditary and idiopathic neuropathy, unspecified: Secondary | ICD-10-CM | POA: Diagnosis not present

## 2024-01-20 DIAGNOSIS — H8110 Benign paroxysmal vertigo, unspecified ear: Secondary | ICD-10-CM | POA: Diagnosis not present

## 2024-01-20 DIAGNOSIS — S42351D Displaced comminuted fracture of shaft of humerus, right arm, subsequent encounter for fracture with routine healing: Secondary | ICD-10-CM | POA: Diagnosis not present

## 2024-01-20 DIAGNOSIS — M4986 Spondylopathy in diseases classified elsewhere, lumbar region: Secondary | ICD-10-CM | POA: Diagnosis not present

## 2024-01-22 DIAGNOSIS — I4891 Unspecified atrial fibrillation: Secondary | ICD-10-CM | POA: Diagnosis not present

## 2024-01-22 DIAGNOSIS — J189 Pneumonia, unspecified organism: Secondary | ICD-10-CM | POA: Diagnosis not present

## 2024-01-22 DIAGNOSIS — H8110 Benign paroxysmal vertigo, unspecified ear: Secondary | ICD-10-CM | POA: Diagnosis not present

## 2024-01-22 DIAGNOSIS — S42351D Displaced comminuted fracture of shaft of humerus, right arm, subsequent encounter for fracture with routine healing: Secondary | ICD-10-CM | POA: Diagnosis not present

## 2024-01-22 DIAGNOSIS — K449 Diaphragmatic hernia without obstruction or gangrene: Secondary | ICD-10-CM | POA: Diagnosis not present

## 2024-01-22 DIAGNOSIS — I5032 Chronic diastolic (congestive) heart failure: Secondary | ICD-10-CM | POA: Diagnosis not present

## 2024-01-22 DIAGNOSIS — D62 Acute posthemorrhagic anemia: Secondary | ICD-10-CM | POA: Diagnosis not present

## 2024-01-24 DIAGNOSIS — R002 Palpitations: Secondary | ICD-10-CM | POA: Diagnosis not present

## 2024-01-24 DIAGNOSIS — I4892 Unspecified atrial flutter: Secondary | ICD-10-CM | POA: Diagnosis not present

## 2024-01-24 NOTE — Telephone Encounter (Signed)
 Called and left message for patient to call back to verify size and how many times a day and exactly which company patient wants to use for cath supplies.   Electronically signed by: Randall Mathew Boers, CMA 01/24/2024 9:07 AM

## 2024-01-27 ENCOUNTER — Ambulatory Visit: Payer: Self-pay | Admitting: Internal Medicine

## 2024-01-27 DIAGNOSIS — G609 Hereditary and idiopathic neuropathy, unspecified: Secondary | ICD-10-CM | POA: Diagnosis not present

## 2024-01-27 DIAGNOSIS — R399 Unspecified symptoms and signs involving the genitourinary system: Secondary | ICD-10-CM | POA: Diagnosis not present

## 2024-01-27 DIAGNOSIS — I25118 Atherosclerotic heart disease of native coronary artery with other forms of angina pectoris: Secondary | ICD-10-CM

## 2024-01-27 DIAGNOSIS — J189 Pneumonia, unspecified organism: Secondary | ICD-10-CM | POA: Diagnosis not present

## 2024-01-27 DIAGNOSIS — M4986 Spondylopathy in diseases classified elsewhere, lumbar region: Secondary | ICD-10-CM | POA: Diagnosis not present

## 2024-01-27 DIAGNOSIS — R3989 Other symptoms and signs involving the genitourinary system: Secondary | ICD-10-CM | POA: Diagnosis not present

## 2024-01-27 DIAGNOSIS — Z22358 Carrier of other enterobacterales: Secondary | ICD-10-CM | POA: Diagnosis not present

## 2024-01-27 DIAGNOSIS — S42351D Displaced comminuted fracture of shaft of humerus, right arm, subsequent encounter for fracture with routine healing: Secondary | ICD-10-CM | POA: Diagnosis not present

## 2024-01-27 DIAGNOSIS — J40 Bronchitis, not specified as acute or chronic: Secondary | ICD-10-CM | POA: Diagnosis not present

## 2024-01-27 DIAGNOSIS — N33 Bladder disorders in diseases classified elsewhere: Secondary | ICD-10-CM | POA: Diagnosis not present

## 2024-01-27 DIAGNOSIS — I4892 Unspecified atrial flutter: Secondary | ICD-10-CM

## 2024-01-27 DIAGNOSIS — R002 Palpitations: Secondary | ICD-10-CM | POA: Diagnosis not present

## 2024-01-27 DIAGNOSIS — R2681 Unsteadiness on feet: Secondary | ICD-10-CM | POA: Diagnosis not present

## 2024-01-27 DIAGNOSIS — D62 Acute posthemorrhagic anemia: Secondary | ICD-10-CM | POA: Diagnosis not present

## 2024-01-27 DIAGNOSIS — H8110 Benign paroxysmal vertigo, unspecified ear: Secondary | ICD-10-CM | POA: Diagnosis not present

## 2024-01-27 NOTE — Progress Notes (Addendum)
 Patient ID: Jennifer [name] MRN: [id number] DOB/AGE: 11-12-1945 78 y.o.  Primary Care Physician:Wharton, Charmaine, [place] Primary Cardiologist: Santo  CC:  Aortic valvular disease management     FOCUSED PROBLEM LIST:   Aortic stenosis AVA 0.89, MG 35, Vmax 3.7, SVI 65, EF 60-65% TTE 2024 EKG SR without conduction defects LVH No amyloid or HOCM CMR 2024  Atrial flutter On Eliquis  CAD 90% dLAD, 50%pLAD cath 2022 Hyperlipidemia Aortic atherosclerosis Coronary CTA 2022 Hypertension CKDIIIA Chronic debilitating back pain BMI 37/BSA 2.01 January 2024: Patient consents to use of AI scribe. The patient is a 9F with the above listed medical problems referred for recommendations regarding her aortic stenosis.  She has been managed by Dr. Santo.  She underwent coronary angiography which demonstrated a high grade lesion of the distal LAD and given tortuosity of the vessel, medical management was pursued.  She did undergo CMR imaging and amyloid was excluded.  Her last echocardiogram demonstrated progression of her aortic stenosis.  She was scheduled to see me earlier however she ended up falling and experiencing a fracture of her proximal right humeral shaft.  She underwent surgical repair and was discharged home.  On September 3rd, she experienced a fall after being startled awake by a knock on her door. She jumped up quickly, lost her balance, and fell, hitting her right shoulder and head against the wall. She immediately felt pain in her right arm, with flailing and inability to move it properly, along with bruising and swelling on her face and arm. Despite the pain, she did not seek immediate medical attention and opted to rest overnight. The following day, significant pain and swelling persisted, prompting her to seek emergency care on September 5th.  In the emergency room, imaging studies, including a CT scan and x-rays, revealed a three-way fracture of her right  humerus. She underwent surgery on September 8th, where a plate and screws were placed to stabilize the fracture. Blood transfusions were administered during her hospital stay. She takes Eliquis , a blood thinner, for her coronary artery disease. She was discharged to a rehabilitation facility on September 12th, where she is currently undergoing physical therapy to regain strength and mobility in her arm.  She has a history of lumbar stenosis with sciatica, which has previously caused significant pain and led to the cancellation of appointments. She also has a history of coronary artery disease, diagnosed in 2022, and experiences occasional shortness of breath and palpitations. She feels winded when walking long distances or climbing stairs and has experienced episodes of chest pain, for which she occasionally takes nitroglycerin . She is on multiple medications for her heart condition, including losartan .  She lives alone and has no family nearby. She has been experiencing some weakness and deconditioning following her fall and subsequent hospitalization, which she attributes to prolonged bed rest and the effects of her injuries and treatments. She is actively participating in rehabilitation to improve her strength and mobility.  Her past medical history includes episodes of vertigo in 2018 and 2019, for which she underwent physical therapy. She also has a history of esophageal reflux disease, which she manages with medication. She has been experiencing lightheadedness upon standing quickly but denies loss of consciousness or fainting during the fall. No current shortness of breath during rehabilitation exercises. Occasional chest pain and palpitations.  No dental complaints.  Plan: Obtain monitor.  Decrease losartan  to 50 mg due to low blood pressure.  Follow-up in 1 month.  October 2025:  Patient consents  to use of AI scribe. The patient returns for 1 month follow-up.  Her monitor demonstrated SVT, PVCs,  and PACs.   She is currently residing in a skilled nursing facility for rehabilitation following surgery. Her heart rate has been low, and there have been adjustments to her medication regimen, specifically metoprolol , which is being held by the nursing home staff when her heart rate is below 65 bpm. She is concerned about the impact of these changes on her blood pressure, which has been fluctuating significantly. Digital blood pressure readings often show higher values compared to manual readings.  She recently developed pneumonia, which lasted for two weeks and significantly weakened her, interrupting her physical therapy. She was treated with breathing treatments, inhalers, and two strong antibiotics, which eventually led to improvement. During this period, she was only able to sleep for about two hours a night due to persistent coughing.  She had a fall on October 13th at the nursing home while trying to reach the bathroom, resulting in a bruise around her surgical incision due to being on Eliquis . X-rays confirmed no new damage. She attributes the fall to being lightheaded and slipping on the carpet.  She is undergoing physical and occupational therapy twice a day, focusing on strengthening her arm and improving her range of motion. She anticipates returning home in two to three weeks, aiming for mid-November.        Past Medical History:  Diagnosis Date   Acute meniscal tear of left knee    FOLLOWED BY DR GIAFFREY   Anemia    Aortic stenosis    Ascending aorta dilation    Atrial flutter (HCC)    AV block, Mobitz 1    BPPV (benign paroxysmal positional vertigo)    CAD (coronary artery disease)    Chronic bladder pain    Chronic fatigue syndrome    Chronic low back pain    W/ RIGHT LEG PAIN AND NUMBNESS   Cystitis, chronic    Fibromyalgia    GERD (gastroesophageal reflux disease)    Hiatal hernia    Hypertension    IC (interstitial cystitis)    LBBB (left bundle branch block)     transient hx   OSA (obstructive sleep apnea)    per pt study yrs ago-- moderate osa ,  intolerant cpap   Pinched vertebral nerve    bilateral L2 -- L3 and L3 -- L4-/  epidural injection's treatment , PT and pain clince   PONV (postoperative nausea and vomiting)    severe   PVC's (premature ventricular contractions)    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    Self-catheterizes urinary bladder    Spinal stenosis, lumbar region with neurogenic claudication    Wears glasses    Wears partial dentures    upper and lower    Past Surgical History:  Procedure Laterality Date   ABDOMINAL AORTOGRAM N/A 09/16/2020   Procedure: ABDOMINAL AORTOGRAM;  Surgeon: Darron Deatrice LABOR, MD;  Location: [facility];  Service: Cardiovascular;  Laterality: N/A;   ABDOMINAL HYSTERECTOMY  1980   w/ Left Salpingoophorectomy   BIOPSY  08/27/2021   Procedure: BIOPSY;  Surgeon: Rosalie Kitchens, MD;  Location: [facility] ENDOSCOPY;  Service: Gastroenterology;;   CARDIAC CATHETERIZATION  08-21-2001  dr victory sharps   normal coronary arteries and LVF   CARPAL TUNNEL RELEASE Bilateral 1986   CECALCYSTOPLASTY/  APPENDECTOMY  1984   at [facility]   CHOLECYSTECTOMY  1992   CYSTO WITH HYDRODISTENSION N/A 01/12/2016   Procedure: CYSTOSCOPY/HYDRODISTENSION;  Surgeon: Arlena Gal, MD;  Location: [facility];  Service: Urology;  Laterality: N/A;   CYSTO WITH HYDRODISTENSION N/A 08/30/2016   Procedure: CYSTOSCOPY/HYDRODISTENSION OF BLADDER INJECTION OF MARCAINE /PYRIDIUM ;  Surgeon: Gal Arlena, MD;  Location: [facility];  Service: Urology;  Laterality: N/A;   ESOPHAGOGASTRODUODENOSCOPY (EGD) WITH PROPOFOL  N/A 08/27/2021   Procedure: ESOPHAGOGASTRODUODENOSCOPY (EGD) WITH PROPOFOL ;  Surgeon: Rosalie Kitchens, MD;  Location: [facility] ENDOSCOPY;  Service: Gastroenterology;  Laterality: N/A;   LEFT HEART CATH AND CORONARY ANGIOGRAPHY N/A 09/16/2020   Procedure: LEFT  HEART CATH AND CORONARY ANGIOGRAPHY;  Surgeon: Darron Deatrice LABOR, MD;  Location: [facility];  Service: Cardiovascular;  Laterality: N/A;   MULTIPLE CYSTO/  HYDRODISTENTION/  INSTILLATION THERAPY  last one 08-19-2009   NASAL SINUS SURGERY  1987   ORIF HUMERUS FRACTURE Right 12/09/2023   Procedure: OPEN REDUCTION INTERNAL FIXATION (ORIF) HUMERAL SHAFT FRACTURE;  Surgeon: Celena Sharper, MD;  Location: [facility] OR;  Service: Orthopedics;  Laterality: Right;   TONSILLECTOMY  as child   VAGINAL GROWTH REMOVED  as teen    Family History  Problem Relation Age of Onset   CAD Father    Asthma Father    Hypertension Father    Alzheimer's disease Father    Emphysema Father    COPD Father    Heart failure Mother    Peripheral vascular disease Mother    Stroke Mother    Hypertension Mother    Colonic polyp Mother    Cancer Brother        melanoma    Social History   Socioeconomic History   Marital status: Single    Spouse name: none   Number of children: 0   Years of education: College   Highest education level: Not on file  Occupational History   Occupation: n/a  Tobacco Use   Smoking status: Never   Smokeless tobacco: Never  Vaping Use   Vaping status: Never Used  Substance and Sexual Activity   Alcohol use: No    Alcohol/week: 0.0 standard drinks of alcohol   Drug use: No   Sexual activity: Not on file  Other Topics Concern   Not on file  Social History Narrative   Lives alone   Alyse Luke Kerns, RN    Caffeine use: 2 glasses tea/day   Brother   Mother passed in ?49 at 64, father passed in 43's in 2006   She was a Runner, Broadcasting/film/video      Social Drivers of Corporate Investment Banker Strain: Low Risk  (03/22/2022)   Overall Financial Resource Strain (CARDIA)    Difficulty of Paying Living Expenses: Not very hard  Food Insecurity: No Food Insecurity (12/07/2023)   Hunger Vital Sign    Worried About Running Out of Food in the Last Year: Never true    Ran Out of Food in the  Last Year: Never true  Transportation Needs: No Transportation Needs (12/07/2023)   PRAPARE - Administrator, Civil Service (Medical): No    Lack of Transportation (Non-Medical): No  Physical Activity: Not on file  Stress: No Stress Concern Present (03/22/2022)   [employer] of Occupational Health - Occupational Stress Questionnaire    Feeling of Stress : Only a little  Social Connections: Moderately Integrated (12/07/2023)   Social Connection and Isolation Panel    Frequency of Communication with Friends and Family: Twice a week  Frequency of Social Gatherings with Friends and Family: Twice a week    Attends Religious Services: 1 to 4 times per year    Active Member of [employer] or Organizations: No    Attends Banker Meetings: 1 to 4 times per year    Marital Status: Never married  Intimate Partner Violence: Not At Risk (12/07/2023)   Humiliation, Afraid, Rape, and Kick questionnaire    Fear of Current or Ex-Partner: No    Emotionally Abused: No    Physically Abused: No    Sexually Abused: No     Prior to Admission medications   Medication Sig Start Date End Date Taking? Authorizing Provider  acetaminophen  (TYLENOL ) 500 MG tablet Take 500 mg by mouth 2 (two) times daily as needed (for pain).    [provider]  aluminum hydroxide-magnesium  carbonate (GAVISCON) 95-358 MG/15ML SUSP Take by mouth as needed for indigestion or heartburn. 01/23/17   [provider]  apixaban  (ELIQUIS ) 5 MG TABS tablet Take 1 tablet (5 mg total) by mouth 2 (two) times daily. 09/11/23   Santo Stanly LABOR, MD  cyanocobalamin  (VITAMIN B12) 1000 MCG tablet Take 1,000 mcg by mouth daily. Patient not taking: Reported on 12/07/2023 07/12/22   [provider]  dextromethorphan -guaiFENesin  (MUCINEX  DM) 30-600 MG 12hr tablet Take 1 tablet by mouth every 12 (twelve) hours as needed for cough.    [provider]  ezetimibe  (ZETIA ) 10 MG tablet Take 1 tablet  (10 mg total) by mouth daily. 06/10/23   Chandrasekhar, Stanly LABOR, MD  fexofenadine (ALLEGRA) 60 MG tablet Take 60 mg by mouth daily as needed for allergies. 01/06/14   [provider]  fluconazole  (DIFLUCAN ) 100 MG tablet Take 100 mg by mouth daily. Patient not taking: Reported on 12/07/2023 05/14/23   [provider]  furosemide  (LASIX ) 40 MG tablet May take an extra dose once daily as needed for swelling 05/27/23   Chandrasekhar, Mahesh A, MD  gabapentin  (NEURONTIN ) 300 MG capsule Take 300 mg by mouth at bedtime. 07/28/21   [provider]  hydrALAZINE  (APRESOLINE ) 50 MG tablet TAKE 1 TABLET BY MOUTH THREE TIMES A DAY 06/07/23   Chandrasekhar, Mahesh A, MD  HYDROcodone -acetaminophen  (NORCO) 7.5-325 MG tablet Take 1 tablet by mouth 3 (three) times daily as needed.    [provider]  HYDROcodone -acetaminophen  (NORCO) 7.5-325 MG tablet Take 1 tablet by mouth every 6 (six) hours as needed for moderate pain (pain score 4-6). 12/12/23   Deward Eck, PA-C  hydrocortisone  (ANUSOL -HC) 25 MG suppository Place 25 mg rectally 2 (two) times daily as needed for hemorrhoids. Patient not taking: Reported on 12/07/2023 12/07/20   [provider]  isosorbide  dinitrate (ISORDIL ) 10 MG tablet TAKE 1 TABLET BY MOUTH THREE TIMES A DAY 06/07/23   Chandrasekhar, Mahesh A, MD  losartan  (COZAAR ) 100 MG tablet TAKE 1 TABLET BY MOUTH EVERY DAY 08/15/23   Chandrasekhar, Mahesh A, MD  meclizine  (ANTIVERT ) 25 MG tablet Take 25 mg by mouth 3 (three) times daily as needed for dizziness.    [provider]  Methen-Hyosc-Meth Blue-Na Phos (UROGESIC-BLUE) 81.6 MG TABS Take 1 tablet by mouth every 6 (six) hours as needed (urinary burning). 08/21/21   [provider]  metoprolol  tartrate (LOPRESSOR ) 25 MG tablet TAKE 1 TABLET BY MOUTH TWICE A DAY 08/15/23   Chandrasekhar, Mahesh A, MD  nitroGLYCERIN  (NITROSTAT ) 0.4 MG SL tablet PLACE 1 TABLET UNDER THE TONGE EVERY 5 MINUTES AS NEEDED FOR  CHEST PAIN *TAKE UP TO  3 TABLETS* 02/21/23   Santo Stanly LABOR, MD  nystatin  cream (MYCOSTATIN ) Apply topically 2 (two) times daily. 08/04/20   Eben Reyes BROCKS, MD  ondansetron  (ZOFRAN -ODT) 4 MG disintegrating tablet Take 4 mg by mouth every 8 (eight) hours as needed for nausea (dissolve orally). 07/01/17   [provider]  pantoprazole  (PROTONIX ) 40 MG tablet Take 40 mg by mouth 2 (two) times daily. 04/15/23   [provider]  polyethylene glycol powder (GLYCOLAX/MIRALAX) 17 GM/SCOOP powder Take 17 g by mouth daily as needed for mild constipation.    [provider]  rosuvastatin  (CRESTOR ) 5 MG tablet Take 1 tablet (5 mg total) by mouth 3 (three) times a week. 08/03/22 05/27/23  Santo Stanly LABOR, MD  senna (SENOKOT) 8.6 MG tablet Take 1 tablet by mouth as needed for constipation. Patient not taking: Reported on 12/07/2023 07/28/21   [provider]  spironolactone  (ALDACTONE ) 25 MG tablet Take 1 tablet (25 mg total) by mouth daily. 09/17/23   Santo Stanly LABOR, MD  sucralfate  (CARAFATE ) 1 g tablet Take 1 tablet (1 g total) by mouth 4 (four) times daily -  with meals and at bedtime. Patient taking differently: Take 1 g by mouth daily at 6 (six) AM. 1 tab in am, 1 tab in pm 08/28/21   Patsy Lenis, MD  triamcinolone  cream (KENALOG ) 0.1 % Apply 1 application. topically 2 (two) times daily as needed (rash). Patient not taking: Reported on 12/07/2023 04/06/21   [provider]    Allergies  Allergen Reactions   Codeine Hives, Nausea And Vomiting and Rash   Dimethyl Sulfoxide Hives and Itching   Macrodantin  [Nitrofurantoin  Macrocrystal] Itching, Nausea And Vomiting and Rash    Abdominal and chest pain, also- blood-red skin, also   Moxifloxacin Hives, Shortness Of Breath, Swelling and Rash    Rash was severe, caused tremors, ans skin became BLOODY RED   Nitrofurantoin  Diarrhea, Itching, Rash and Other (See Comments)    GI/stomach upset, sweating,  chest pain, and skin turned bloody red   Quinolones Anaphylaxis   Sulfa Antibiotics Hives and Rash   Tape Rash   Topiramate Itching   Furosemide  Other (See Comments)    CANNOT TAKE DUE TO Interstitial cystitis   Duloxetine Hcl Nausea Only and Other (See Comments)    Vertigo and heart racing also    Ranexa  [Ranolazine ]     Constipation/heart pounding   Robaxin [Methocarbamol] Other (See Comments)    Make me feel flu-like symptoms    REVIEW OF SYSTEMS:  General: no fevers/chills/night sweats Eyes: no blurry vision, diplopia, or amaurosis ENT: no sore throat or hearing loss Resp: no cough, wheezing, or hemoptysis CV: no edema or palpitations GI: no abdominal pain, nausea, vomiting, diarrhea, or constipation GU: no dysuria, frequency, or hematuria Skin: no rash Neuro: no headache, numbness, tingling, or weakness of extremities Musculoskeletal: no joint pain or swelling Heme: no bleeding, DVT, or easy bruising Endo: no polydipsia or polyuria  BP (!) 142/60 (BP Location: Left Arm, Patient Position: Sitting, Cuff Size: Large)   Pulse 60   Ht 5' 2.5 (1.588 m)   Wt 199 lb (90.3 kg)   SpO2 94%   BMI 35.82 kg/m   PHYSICAL EXAM: GEN:  AO x 3 in no acute distress HEENT: normal Dentition: Normal Neck: JVP normal. +2 carotid upstrokes without bruits. No thyromegaly. Lungs: equal expansion, clear bilaterally CV: Apex is discrete and nondisplaced, RRR with 3/6 SEM Abd: soft, non-tender, non-distended; no bruit; positive bowel sounds Ext:  no edema, ecchymoses, or cyanosis Vascular: 2+ femoral pulses, 2+ radial pulses       Skin: warm and dry without rash Neuro: CN II-XII grossly intact; motor and sensory grossly intact    DATA AND STUDIES:  EKG: 2025 sinus rhythm nonspecific ST and T wave abnormality  EKG Interpretation Date/Time:    Ventricular Rate:    PR Interval:    QRS Duration:    QT Interval:    QTC Calculation:   R Axis:      Text Interpretation:           CARDIAC STUDIES: Refer to CV Procedures and Imaging Tabs  12/08/2023: ALT 27 12/10/2023: BUN 15; Creatinine, Ser 0.88; Potassium 4.1; Sodium 138 12/13/2023: Hemoglobin 9.0; Platelets 255   STS RISK CALCULATOR: Pending  NHYA CLASS: 2    ASSESSMENT AND PLAN:   1. Nonrheumatic aortic valve stenosis   2. Atrial flutter, unspecified type (HCC)   3. Coronary artery disease of native artery of native heart with stable angina pectoris   4. Hyperlipidemia LDL goal <70   5. Aortic atherosclerosis   6. Primary hypertension   7. Stage 3a chronic kidney disease (HCC)   8. BMI 37.0-37.9, adult   9. Palpitations      Aortic stenosis: Patient remains relatively unchanged.  She is doing better from a rehabilitative standpoint.  She will require aortic valve intervention.  I think it best for her to come back and see me in 6 weeks in early December when she is back home and more rehabilitated.  She is going in the right direction. Atrial flutter: Continue apixaban  5 mg twice daily, discontinue Lopressor  25 mg twice daily; start Toprol  25 mg at bedtime.  No atrial flutter seen on most recent monitor  CAD: Continue Eliquis  5 mg twice daily, Zetia  10 mg Hyperlipidemia: Intolerant of rosuvastatin  continue Zetia  10 mg Aortic atherosclerosis: Continue Eliquis  5 mg twice daily, Zetia  10 mg daily  Hypertension: Continue hydralazine  50 mg 3 times daily, spironolactone  25 mg daily, increase losartan  to 100 mg to offset decrease in beta-blocker dose CKD stage IIIa: Continue losartan  with dose adjustment as above Elevated BMI: Continue diet and exercise modification Palpitations: Monitor demonstrated multiple SVT episodes, where PACs, and PVCs.  I counseled the patient that we should hold on further medical therapy for this.  Her symptom burden does not seem to be excessive.  I think things will be better for her when she leaves the rehab facility and gets back to her home.   I have personally reviewed the  patients imaging data as summarized above.  I have reviewed the natural history of aortic stenosis with the patient and family members who are present today. We have discussed the limitations of medical therapy and the poor prognosis associated with symptomatic aortic stenosis. We have also reviewed potential treatment options, including palliative medical therapy, conventional surgical aortic valve replacement, and transcatheter aortic valve replacement. We discussed treatment options in the context of this patient's specific comorbid medical conditions.   All of the patient's questions were answered today. Will make further recommendations based on the results of studies outlined above.   I spent 35 minutes reviewing all clinical data during and prior to this visit including all relevant imaging studies, laboratories, clinical information from other health systems and prior notes from both Cardiology and other specialties, interviewing the patient, conducting a complete physical examination, and coordinating care in order to formulate a comprehensive and personalized evaluation and treatment plan.  Loris Seelye K Aamna Mallozzi, MD  01/30/2024 2:12 PM    [facility] [address] [place], [place], [place]  [address] Phone: [phone]; Fax: [phone]

## 2024-01-29 DIAGNOSIS — G609 Hereditary and idiopathic neuropathy, unspecified: Secondary | ICD-10-CM | POA: Diagnosis not present

## 2024-01-29 DIAGNOSIS — H8110 Benign paroxysmal vertigo, unspecified ear: Secondary | ICD-10-CM | POA: Diagnosis not present

## 2024-01-29 DIAGNOSIS — M4986 Spondylopathy in diseases classified elsewhere, lumbar region: Secondary | ICD-10-CM | POA: Diagnosis not present

## 2024-01-29 DIAGNOSIS — R2681 Unsteadiness on feet: Secondary | ICD-10-CM | POA: Diagnosis not present

## 2024-01-29 DIAGNOSIS — J189 Pneumonia, unspecified organism: Secondary | ICD-10-CM | POA: Diagnosis not present

## 2024-01-29 DIAGNOSIS — S42351D Displaced comminuted fracture of shaft of humerus, right arm, subsequent encounter for fracture with routine healing: Secondary | ICD-10-CM | POA: Diagnosis not present

## 2024-01-29 DIAGNOSIS — S42211D Unspecified displaced fracture of surgical neck of right humerus, subsequent encounter for fracture with routine healing: Secondary | ICD-10-CM | POA: Diagnosis not present

## 2024-01-30 ENCOUNTER — Ambulatory Visit: Admitting: Internal Medicine

## 2024-01-30 ENCOUNTER — Ambulatory Visit: Attending: Internal Medicine | Admitting: Internal Medicine

## 2024-01-30 ENCOUNTER — Encounter: Payer: Self-pay | Admitting: Internal Medicine

## 2024-01-30 VITALS — BP 142/60 | HR 60 | Ht 62.5 in | Wt 199.0 lb

## 2024-01-30 DIAGNOSIS — I10 Essential (primary) hypertension: Secondary | ICD-10-CM | POA: Insufficient documentation

## 2024-01-30 DIAGNOSIS — R002 Palpitations: Secondary | ICD-10-CM | POA: Insufficient documentation

## 2024-01-30 DIAGNOSIS — N1831 Chronic kidney disease, stage 3a: Secondary | ICD-10-CM | POA: Insufficient documentation

## 2024-01-30 DIAGNOSIS — I700 Atherosclerosis of aorta: Secondary | ICD-10-CM | POA: Diagnosis not present

## 2024-01-30 DIAGNOSIS — I25118 Atherosclerotic heart disease of native coronary artery with other forms of angina pectoris: Secondary | ICD-10-CM | POA: Insufficient documentation

## 2024-01-30 DIAGNOSIS — Z6837 Body mass index (BMI) 37.0-37.9, adult: Secondary | ICD-10-CM | POA: Insufficient documentation

## 2024-01-30 DIAGNOSIS — I350 Nonrheumatic aortic (valve) stenosis: Secondary | ICD-10-CM | POA: Diagnosis not present

## 2024-01-30 DIAGNOSIS — I4892 Unspecified atrial flutter: Secondary | ICD-10-CM | POA: Diagnosis not present

## 2024-01-30 DIAGNOSIS — E785 Hyperlipidemia, unspecified: Secondary | ICD-10-CM | POA: Diagnosis not present

## 2024-01-30 MED ORDER — METOPROLOL SUCCINATE ER 25 MG PO TB24: 25.0000 mg | ORAL_TABLET | Freq: Every day | ORAL | 3 refills | Status: AC

## 2024-01-30 MED ORDER — LOSARTAN POTASSIUM 100 MG PO TABS: 100.0000 mg | ORAL_TABLET | Freq: Every day | ORAL | 3 refills | Status: DC

## 2024-01-30 NOTE — Patient Instructions (Signed)
 Medication Instructions:  Please discontinue your Lopressor  and start Metoprolol  Succinate 25 mg a day. Increase Losartan  to 100 mg a day. Continue all other medications as listed.  *If you need a refill on your cardiac medications before your next appointment, please call your pharmacy*  Follow-Up: At [facility], you and your health needs are our priority.  As part of our continuing mission to provide you with exceptional heart care, our providers are all part of one team.  This team includes your primary Cardiologist (physician) and Advanced Practice Providers or APPs (Physician Assistants and Nurse Practitioners) who all work together to provide you with the care you need, when you need it.  Your next appointment:   6 week(s)  Provider:   Dr Wendel     We recommend signing up for the patient portal called MyChart.  Sign up information is provided on this After Visit Summary.  MyChart is used to connect with patients for Virtual Visits (Telemedicine).  Patients are able to view lab/test results, encounter notes, upcoming appointments, etc.  Non-urgent messages can be sent to your provider as well.   To learn more about what you can do with MyChart, go to [email or web address].

## 2024-01-31 ENCOUNTER — Telehealth: Payer: Self-pay | Admitting: Cardiology

## 2024-01-31 NOTE — Telephone Encounter (Signed)
 Arun K Thukkani, MD 01/27/2024  9:50 AM EDT     Let her know that her monitor showed extra heartbeats from the top and bottom of the heart.  These are not dangerous but they can be bothersome from a symptom standpoint.  We can do 1 of 2 things.  If it is really bothersome we can start a medication (120 mg Cardizem CD, LA, cardia XT at bedtime).  If it is not that bothersome then I would say just hold off and we can discuss when I see her next.    Went over all of the information above with the patient, she said she had an OV yesterday and it was explained at that time. She does not wish to be on any other meds at this time. She reports that the extra beats are not too bad/prevalent.   She will f/u with Dr Wendel on 03/12/24 at scheduled appt.

## 2024-01-31 NOTE — Telephone Encounter (Signed)
 Patient states she is returning call for results. Please advise

## 2024-02-03 DIAGNOSIS — G609 Hereditary and idiopathic neuropathy, unspecified: Secondary | ICD-10-CM | POA: Diagnosis not present

## 2024-02-03 DIAGNOSIS — M4986 Spondylopathy in diseases classified elsewhere, lumbar region: Secondary | ICD-10-CM | POA: Diagnosis not present

## 2024-02-03 DIAGNOSIS — S42351D Displaced comminuted fracture of shaft of humerus, right arm, subsequent encounter for fracture with routine healing: Secondary | ICD-10-CM | POA: Diagnosis not present

## 2024-02-03 DIAGNOSIS — R2681 Unsteadiness on feet: Secondary | ICD-10-CM | POA: Diagnosis not present

## 2024-02-03 DIAGNOSIS — J189 Pneumonia, unspecified organism: Secondary | ICD-10-CM | POA: Diagnosis not present

## 2024-02-03 DIAGNOSIS — H8110 Benign paroxysmal vertigo, unspecified ear: Secondary | ICD-10-CM | POA: Diagnosis not present

## 2024-02-04 DIAGNOSIS — M25511 Pain in right shoulder: Secondary | ICD-10-CM | POA: Diagnosis not present

## 2024-02-04 DIAGNOSIS — I351 Nonrheumatic aortic (valve) insufficiency: Secondary | ICD-10-CM | POA: Diagnosis not present

## 2024-02-04 DIAGNOSIS — R3989 Other symptoms and signs involving the genitourinary system: Secondary | ICD-10-CM | POA: Diagnosis not present

## 2024-02-04 DIAGNOSIS — I471 Supraventricular tachycardia, unspecified: Secondary | ICD-10-CM | POA: Diagnosis not present

## 2024-02-04 DIAGNOSIS — I4891 Unspecified atrial fibrillation: Secondary | ICD-10-CM | POA: Diagnosis not present

## 2024-02-04 DIAGNOSIS — G894 Chronic pain syndrome: Secondary | ICD-10-CM | POA: Diagnosis not present

## 2024-02-04 DIAGNOSIS — I350 Nonrheumatic aortic (valve) stenosis: Secondary | ICD-10-CM | POA: Diagnosis not present

## 2024-02-07 ENCOUNTER — Telehealth: Payer: Self-pay | Admitting: Internal Medicine

## 2024-02-07 NOTE — Telephone Encounter (Signed)
 Pt returning nurses call from 11/4. Pt states that she is currently in Rehab. She ask that if call can't be returned today, please call between 12 noon and 2 pm if possible due to her having therapy. Please advise

## 2024-02-07 NOTE — Telephone Encounter (Signed)
 Called patient left message on personal voice mail to call back.

## 2024-02-10 DIAGNOSIS — H8110 Benign paroxysmal vertigo, unspecified ear: Secondary | ICD-10-CM | POA: Diagnosis not present

## 2024-02-10 DIAGNOSIS — R2681 Unsteadiness on feet: Secondary | ICD-10-CM | POA: Diagnosis not present

## 2024-02-10 DIAGNOSIS — S42351D Displaced comminuted fracture of shaft of humerus, right arm, subsequent encounter for fracture with routine healing: Secondary | ICD-10-CM | POA: Diagnosis not present

## 2024-02-10 DIAGNOSIS — J189 Pneumonia, unspecified organism: Secondary | ICD-10-CM | POA: Diagnosis not present

## 2024-02-10 DIAGNOSIS — G609 Hereditary and idiopathic neuropathy, unspecified: Secondary | ICD-10-CM | POA: Diagnosis not present

## 2024-02-10 DIAGNOSIS — M4986 Spondylopathy in diseases classified elsewhere, lumbar region: Secondary | ICD-10-CM | POA: Diagnosis not present

## 2024-02-11 NOTE — Telephone Encounter (Signed)
 Pt calling back for heart monitor results  She ask that if call can't be returned today, please call between 12 noon and 2 pm if possible due to her having therapy. Please advise

## 2024-02-12 DIAGNOSIS — L603 Nail dystrophy: Secondary | ICD-10-CM | POA: Diagnosis not present

## 2024-02-12 DIAGNOSIS — S42351D Displaced comminuted fracture of shaft of humerus, right arm, subsequent encounter for fracture with routine healing: Secondary | ICD-10-CM | POA: Diagnosis not present

## 2024-02-12 DIAGNOSIS — J189 Pneumonia, unspecified organism: Secondary | ICD-10-CM | POA: Diagnosis not present

## 2024-02-12 DIAGNOSIS — R2681 Unsteadiness on feet: Secondary | ICD-10-CM | POA: Diagnosis not present

## 2024-02-12 DIAGNOSIS — L602 Onychogryphosis: Secondary | ICD-10-CM | POA: Diagnosis not present

## 2024-02-12 DIAGNOSIS — H8110 Benign paroxysmal vertigo, unspecified ear: Secondary | ICD-10-CM | POA: Diagnosis not present

## 2024-02-12 DIAGNOSIS — M4986 Spondylopathy in diseases classified elsewhere, lumbar region: Secondary | ICD-10-CM | POA: Diagnosis not present

## 2024-02-12 DIAGNOSIS — I739 Peripheral vascular disease, unspecified: Secondary | ICD-10-CM | POA: Diagnosis not present

## 2024-02-12 DIAGNOSIS — L84 Corns and callosities: Secondary | ICD-10-CM | POA: Diagnosis not present

## 2024-02-12 DIAGNOSIS — M2042 Other hammer toe(s) (acquired), left foot: Secondary | ICD-10-CM | POA: Diagnosis not present

## 2024-02-12 DIAGNOSIS — G609 Hereditary and idiopathic neuropathy, unspecified: Secondary | ICD-10-CM | POA: Diagnosis not present

## 2024-02-12 NOTE — Telephone Encounter (Signed)
 Patient stated she is in rehab at this time and wants a call back from [employer] regarding her medication.  Patient noted she does not have voice mail but will be back in her room between 4:00-5:00 pm today.

## 2024-02-12 NOTE — Telephone Encounter (Signed)
 Spoke with pt and she stated she had discussed this with Dr. Wendel on 10/30 at [facility] and she will be holding off on adding Cardizem. Pt did have a question about her Zetia  and Crestor . Pt stated she has not been taking Crestor  for awhile now because of how it made her feel with her fibromyalgia and Dr. Santo had moved her to Zetia . OV note from 10/30 states for pt to continue Crestor  and doesn't mention Zetia  however Zetia  is still on pt medication list. Will document on original result note and send to Dr. Wendel for further clarification.

## 2024-02-13 ENCOUNTER — Other Ambulatory Visit: Payer: Self-pay

## 2024-02-14 DIAGNOSIS — Z79891 Long term (current) use of opiate analgesic: Secondary | ICD-10-CM | POA: Diagnosis not present

## 2024-02-14 DIAGNOSIS — G8929 Other chronic pain: Secondary | ICD-10-CM | POA: Diagnosis not present

## 2024-02-14 DIAGNOSIS — K219 Gastro-esophageal reflux disease without esophagitis: Secondary | ICD-10-CM | POA: Diagnosis not present

## 2024-02-14 DIAGNOSIS — K5900 Constipation, unspecified: Secondary | ICD-10-CM | POA: Diagnosis not present

## 2024-02-17 DIAGNOSIS — R2681 Unsteadiness on feet: Secondary | ICD-10-CM | POA: Diagnosis not present

## 2024-02-17 DIAGNOSIS — J189 Pneumonia, unspecified organism: Secondary | ICD-10-CM | POA: Diagnosis not present

## 2024-02-17 DIAGNOSIS — G609 Hereditary and idiopathic neuropathy, unspecified: Secondary | ICD-10-CM | POA: Diagnosis not present

## 2024-02-17 DIAGNOSIS — S42351D Displaced comminuted fracture of shaft of humerus, right arm, subsequent encounter for fracture with routine healing: Secondary | ICD-10-CM | POA: Diagnosis not present

## 2024-02-17 DIAGNOSIS — H8110 Benign paroxysmal vertigo, unspecified ear: Secondary | ICD-10-CM | POA: Diagnosis not present

## 2024-02-17 DIAGNOSIS — M4986 Spondylopathy in diseases classified elsewhere, lumbar region: Secondary | ICD-10-CM | POA: Diagnosis not present

## 2024-02-17 NOTE — Telephone Encounter (Signed)
 Pt called in and stated she just wanted follow from the conversation last week and see if there was an answer regarding the medication.   Best time to call her is 4:20 or after. She is currently in rehab Best number  [phone]

## 2024-02-17 NOTE — Telephone Encounter (Signed)
 Patient calling to follow-up on message from last week.  She also reports she has been feeling lightheaded, HR 50's-60's and sometimes 40's. BP 117/50 this morning.  She has been feeling worn out at the end of the day. She has adjusted her Cymbalta to 30 mg twice daily and Lyrica  to once daily to see if this helps with her energy levels. She states she is sensitive to medications. She is not sure if it is her medications or her BP/HR causing decreased energy levels/lightheadedness or a combination of this with her medications.  Will forward message to Dr. Wendel to review.  Patient is at rehab at [facility] and requests callback between 12-2 pm or after 4:20 pm. Best callback number: [phone].

## 2024-02-21 NOTE — Telephone Encounter (Signed)
 Called pt to let her know of Dr. Parry recommendations on 11/20. Pt stated at that time that her therapist was getting ready to start working with her. Quickly explained that Dr. Wendel recommended stopping Metoprolol  due to issues with low HR. Pt asked for me to call [facility] to let them know of this. Attempted to call 11/20 evening and the phone continued to ring once I was transferred to the pt's living area of [facility]. Attempted to call again tonight (11/21)- attempted to be transferred to [facility], the phone continued to ring but no one would answer.

## 2024-02-25 DIAGNOSIS — H8110 Benign paroxysmal vertigo, unspecified ear: Secondary | ICD-10-CM | POA: Diagnosis not present

## 2024-02-25 DIAGNOSIS — M4986 Spondylopathy in diseases classified elsewhere, lumbar region: Secondary | ICD-10-CM | POA: Diagnosis not present

## 2024-02-25 DIAGNOSIS — S42351D Displaced comminuted fracture of shaft of humerus, right arm, subsequent encounter for fracture with routine healing: Secondary | ICD-10-CM | POA: Diagnosis not present

## 2024-02-25 DIAGNOSIS — R2681 Unsteadiness on feet: Secondary | ICD-10-CM | POA: Diagnosis not present

## 2024-02-25 DIAGNOSIS — J189 Pneumonia, unspecified organism: Secondary | ICD-10-CM | POA: Diagnosis not present

## 2024-02-25 DIAGNOSIS — G609 Hereditary and idiopathic neuropathy, unspecified: Secondary | ICD-10-CM | POA: Diagnosis not present

## 2024-03-02 DIAGNOSIS — R2681 Unsteadiness on feet: Secondary | ICD-10-CM | POA: Diagnosis not present

## 2024-03-02 DIAGNOSIS — H8110 Benign paroxysmal vertigo, unspecified ear: Secondary | ICD-10-CM | POA: Diagnosis not present

## 2024-03-02 DIAGNOSIS — M4986 Spondylopathy in diseases classified elsewhere, lumbar region: Secondary | ICD-10-CM | POA: Diagnosis not present

## 2024-03-02 DIAGNOSIS — S42351D Displaced comminuted fracture of shaft of humerus, right arm, subsequent encounter for fracture with routine healing: Secondary | ICD-10-CM | POA: Diagnosis not present

## 2024-03-02 DIAGNOSIS — G609 Hereditary and idiopathic neuropathy, unspecified: Secondary | ICD-10-CM | POA: Diagnosis not present

## 2024-03-02 DIAGNOSIS — J189 Pneumonia, unspecified organism: Secondary | ICD-10-CM | POA: Diagnosis not present

## 2024-03-02 NOTE — Progress Notes (Signed)
 Patient ID: Jennifer [name] MRN: [id number] DOB/AGE: 76/18/1947 76 y.o.  Primary Care Physician:Wharton, Charmaine, [place] Primary Cardiologist: Santo  CC:  Aortic valvular disease management     FOCUSED PROBLEM LIST:   Aortic stenosis AVA 0.89, MG 35, Vmax 3.7, SVI 65, EF 60-65% TTE 2024 EKG SR without conduction defects LVH No amyloid or HOCM CMR 2024  Atrial flutter On Eliquis  CAD 90% dLAD, 50%pLAD cath 2022 Hyperlipidemia Aortic atherosclerosis Coronary CTA 2022 Hypertension Chronic debilitating back pain BMI 37/BSA 2.01 January 2024: Patient consents to use of AI scribe. The patient is a 49F with the above listed medical problems referred for recommendations regarding her aortic stenosis.  She has been managed by Dr. Santo.  She underwent coronary angiography which demonstrated a high grade lesion of the distal LAD and given tortuosity of the vessel, medical management was pursued.  She did undergo CMR imaging and amyloid was excluded.  Her last echocardiogram demonstrated progression of her aortic stenosis.  She was scheduled to see me earlier however she ended up falling and experiencing a fracture of her proximal right humeral shaft.  She underwent surgical repair and was discharged home.  On September 3rd, she experienced a fall after being startled awake by a knock on her door. She jumped up quickly, lost her balance, and fell, hitting her right shoulder and head against the wall. She immediately felt pain in her right arm, with flailing and inability to move it properly, along with bruising and swelling on her face and arm. Despite the pain, she did not seek immediate medical attention and opted to rest overnight. The following day, significant pain and swelling persisted, prompting her to seek emergency care on September 5th.  In the emergency room, imaging studies, including a CT scan and x-rays, revealed a three-way fracture of her right humerus. She  underwent surgery on September 8th, where a plate and screws were placed to stabilize the fracture. Blood transfusions were administered during her hospital stay. She takes Eliquis , a blood thinner, for her coronary artery disease. She was discharged to a rehabilitation facility on September 12th, where she is currently undergoing physical therapy to regain strength and mobility in her arm.  She has a history of lumbar stenosis with sciatica, which has previously caused significant pain and led to the cancellation of appointments. She also has a history of coronary artery disease, diagnosed in 2022, and experiences occasional shortness of breath and palpitations. She feels winded when walking long distances or climbing stairs and has experienced episodes of chest pain, for which she occasionally takes nitroglycerin . She is on multiple medications for her heart condition, including losartan .  She lives alone and has no family nearby. She has been experiencing some weakness and deconditioning following her fall and subsequent hospitalization, which she attributes to prolonged bed rest and the effects of her injuries and treatments. She is actively participating in rehabilitation to improve her strength and mobility.  Her past medical history includes episodes of vertigo in 2018 and 2019, for which she underwent physical therapy. She also has a history of esophageal reflux disease, which she manages with medication. She has been experiencing lightheadedness upon standing quickly but denies loss of consciousness or fainting during the fall. No current shortness of breath during rehabilitation exercises. Occasional chest pain and palpitations.  No dental complaints.  Plan: Obtain monitor.  Decrease losartan  to 50 mg due to low blood pressure.  Follow-up in 1 month.  October 2025:  Patient consents to  use of AI scribe. The patient returns for 1 month follow-up.  Her monitor demonstrated SVT, PVCs, and PACs.    She is currently residing in a skilled nursing facility for rehabilitation following surgery. Her heart rate has been low, and there have been adjustments to her medication regimen, specifically metoprolol , which is being held by the nursing home staff when her heart rate is below 65 bpm. She is concerned about the impact of these changes on her blood pressure, which has been fluctuating significantly. Digital blood pressure readings often show higher values compared to manual readings.  She recently developed pneumonia, which lasted for two weeks and significantly weakened her, interrupting her physical therapy. She was treated with breathing treatments, inhalers, and two strong antibiotics, which eventually led to improvement. During this period, she was only able to sleep for about two hours a night due to persistent coughing.  She had a fall on October 13th at the nursing home while trying to reach the bathroom, resulting in a bruise around her surgical incision due to being on Eliquis . X-rays confirmed no new damage. She attributes the fall to being lightheaded and slipping on the carpet.  She is undergoing physical and occupational therapy twice a day, focusing on strengthening her arm and improving her range of motion. She anticipates returning home in two to three weeks, aiming for mid-November.  Plan: Follow-up in 6 weeks after further rehabilitation.  Discontinue Lopressor  25 mg and start Toprol  25 mg at bedtime.  Increase losartan  to 100 mg  December 2025:  Patient consents to use of AI scribe. The patient returns for 6-week follow-up.  She is currently residing in a nursing facility for rehabilitation following arm surgery on September 8th. The surgery was successful, and she is making progress with walking, planning to return to her apartment on December 20th. The orthopedic surgeon and occupational therapist told her that it may take up to six months from the time of surgery to achieve much  more improvement.  She experiences lightheadedness and nausea, which she associates with the introduction of Cymbalta for fibromyalgia pain. After discontinuing Cymbalta, the nausea subsided, but lightheadedness and a 'creepy crawly itch' persisted. She continues to take Lyrica  for fibromyalgia.  She has noticed increased shortness of breath and heart fluttering, particularly when walking in the halls of the nursing facility. She feels that some of this may be related to wearing a mask due to a COVID outbreak in her hall. She has a history of coronary artery disease and has undergone two heart catheterizations, the most recent in 2022, which revealed blockages too deep for stenting.  Her blood pressure has been variable, with concerns about low diastolic readings. She is currently on metoprolol  25 mg once daily and losartan  100 mg, which has been adjusted to 50 mg due to lightheadedness. She monitors her symptoms closely, particularly the lightheadedness and equilibrium issues.        Past Medical History:  Diagnosis Date   Acute meniscal tear of left knee    FOLLOWED BY DR GIAFFREY   Anemia    Aortic stenosis    Ascending aorta dilation    Atrial flutter (HCC)    AV block, Mobitz 1    BPPV (benign paroxysmal positional vertigo)    CAD (coronary artery disease)    Chronic bladder pain    Chronic fatigue syndrome    Chronic low back pain    W/ RIGHT LEG PAIN AND NUMBNESS   Cystitis, chronic  Fibromyalgia    GERD (gastroesophageal reflux disease)    Hiatal hernia    Hypertension    IC (interstitial cystitis)    LBBB (left bundle branch block)    transient hx   OSA (obstructive sleep apnea)    per pt study yrs ago-- moderate osa ,  intolerant cpap   Pinched vertebral nerve    bilateral L2 -- L3 and L3 -- L4-/  epidural injection's treatment , PT and pain clince   PONV (postoperative nausea and vomiting)    severe   PVC's (premature ventricular contractions)    S/P urinary  bladder replacement    1984  new bladder made from colon    Self-catheterizes urinary bladder    Spinal stenosis, lumbar region with neurogenic claudication    Wears glasses    Wears partial dentures    upper and lower    Past Surgical History:  Procedure Laterality Date   ABDOMINAL AORTOGRAM N/A 09/16/2020   Procedure: ABDOMINAL AORTOGRAM;  Surgeon: Darron Deatrice LABOR, MD;  Location: [facility];  Service: Cardiovascular;  Laterality: N/A;   ABDOMINAL HYSTERECTOMY  1980   w/ Left Salpingoophorectomy   BIOPSY  08/27/2021   Procedure: BIOPSY;  Surgeon: Rosalie Kitchens, MD;  Location: [facility] ENDOSCOPY;  Service: Gastroenterology;;   CARDIAC CATHETERIZATION  08-21-2001  dr victory sharps   normal coronary arteries and LVF   CARPAL TUNNEL RELEASE Bilateral 1986   CECALCYSTOPLASTY/  APPENDECTOMY  1984   [facility]   CHOLECYSTECTOMY  1992   CYSTO WITH HYDRODISTENSION N/A 01/12/2016   Procedure: CYSTOSCOPY/HYDRODISTENSION;  Surgeon: Arlena Gal, MD;  Location[facility];  Service: Urology;  Laterality: N/A;   CYSTO WITH HYDRODISTENSION N/A 08/30/2016   Procedure: CYSTOSCOPY/HYDRODISTENSION OF BLADDER INJECTION OF MARCAINE /PYRIDIUM ;  Surgeon: Gal Arlena, MD;  Location: [facility];  Service: Urology;  Laterality: N/A;   ESOPHAGOGASTRODUODENOSCOPY (EGD) WITH PROPOFOL  N/A 08/27/2021   Procedure: ESOPHAGOGASTRODUODENOSCOPY (EGD) WITH PROPOFOL ;  Surgeon: Rosalie Kitchens, MD;  Location: [facility] ENDOSCOPY;  Service: Gastroenterology;  Laterality: N/A;   LEFT HEART CATH AND CORONARY ANGIOGRAPHY N/A 09/16/2020   Procedure: LEFT HEART CATH AND CORONARY ANGIOGRAPHY;  Surgeon: Darron Deatrice LABOR, MD;  Location: [facility];  Service: Cardiovascular;  Laterality: N/A;   MULTIPLE CYSTO/  HYDRODISTENTION/  INSTILLATION THERAPY  last one 08-19-2009   NASAL SINUS SURGERY  1987   ORIF HUMERUS FRACTURE Right 12/09/2023   Procedure: OPEN REDUCTION INTERNAL FIXATION (ORIF)  HUMERAL SHAFT FRACTURE;  Surgeon: Celena Sharper, MD;  Location: [facility] OR;  Service: Orthopedics;  Laterality: Right;   TONSILLECTOMY  as child   VAGINAL GROWTH REMOVED  as teen    Family History  Problem Relation Age of Onset   CAD Father    Asthma Father    Hypertension Father    Alzheimer's disease Father    Emphysema Father    COPD Father    Heart failure Mother    Peripheral vascular disease Mother    Stroke Mother    Hypertension Mother    Colonic polyp Mother    Cancer Brother        melanoma    Social History   Socioeconomic History   Marital status: Single    Spouse name: none   Number of children: 0   Years of education: College   Highest education level: Not on file  Occupational History   Occupation: n/a  Tobacco Use   Smoking status: Never   Smokeless tobacco: Never  Vaping Use   Vaping status: Never Used  Substance and Sexual Activity   Alcohol use: No    Alcohol/week: 0.0 standard drinks of alcohol   Drug use: No   Sexual activity: Not on file  Other Topics Concern   Not on file  Social History Narrative   Lives alone   Alyse Luke Kerns, RN    Caffeine use: 2 glasses tea/day   Brother   Mother passed in ?65 at 16, father passed in 59's in 2006   She was a Runner, Broadcasting/film/video      Social Drivers of Health   Tobacco Use: Low Risk (03/12/2024)   Patient History    Smoking Tobacco Use: Never    Smokeless Tobacco Use: Never    Passive Exposure: Not on file  Financial Resource Strain: Low Risk (03/22/2022)   Overall Financial Resource Strain (CARDIA)    Difficulty of Paying Living Expenses: Not very hard  Food Insecurity: No Food Insecurity (12/07/2023)   Epic    Worried About Radiation Protection Practitioner of Food in the Last Year: Never true    Ran Out of Food in the Last Year: Never true  Transportation Needs: No Transportation Needs (12/07/2023)   Epic    Lack of Transportation (Medical): No    Lack of Transportation (Non-Medical): No  Physical Activity: Not on file   Stress: No Stress Concern Present (03/22/2022)   [employer] of Occupational Health - Occupational Stress Questionnaire    Feeling of Stress : Only a little  Social Connections: Moderately Integrated (12/07/2023)   Social Connection and Isolation Panel    Frequency of Communication with Friends and Family: Twice a week    Frequency of Social Gatherings with Friends and Family: Twice a week    Attends Religious Services: 1 to 4 times per year    Active Member of [employer] or Organizations: No    Attends Banker Meetings: 1 to 4 times per year    Marital Status: Never married  Intimate Partner Violence: Not At Risk (12/07/2023)   Epic    Fear of Current or Ex-Partner: No    Emotionally Abused: No    Physically Abused: No    Sexually Abused: No  Depression (PHQ2-9): Low Risk (04/30/2023)   Depression (PHQ2-9)    PHQ-2 Score: 0  Alcohol Screen: Not on file  Housing: Low Risk (12/07/2023)   Epic    Unable to Pay for Housing in the Last Year: No    Number of Times Moved in the Last Year: 0    Homeless in the Last Year: No  Utilities: Not At Risk (12/07/2023)   Epic    Threatened with loss of utilities: No  Health Literacy: Not on file     Prior to Admission medications   Medication Sig Start Date End Date Taking? Authorizing Provider  acetaminophen  (TYLENOL ) 500 MG tablet Take 500 mg by mouth 2 (two) times daily as needed (for pain).    [provider]  aluminum hydroxide-magnesium  carbonate (GAVISCON) 95-358 MG/15ML SUSP Take by mouth as needed for indigestion or heartburn. 01/23/17   [provider]  apixaban  (ELIQUIS ) 5 MG TABS tablet Take 1 tablet (5 mg total) by mouth 2 (two) times daily. 09/11/23   Santo Stanly LABOR, MD  cyanocobalamin  (VITAMIN B12) 1000 MCG tablet Take 1,000 mcg by mouth daily. Patient not taking: Reported on 12/07/2023 07/12/22   [provider]  dextromethorphan -guaiFENesin  (MUCINEX  DM) 30-600 MG 12hr tablet Take 1 tablet  by mouth  every 12 (twelve) hours as needed for cough.    [provider]  ezetimibe  (ZETIA ) 10 MG tablet Take 1 tablet (10 mg total) by mouth daily. 06/10/23   Chandrasekhar, Stanly LABOR, MD  fexofenadine (ALLEGRA) 60 MG tablet Take 60 mg by mouth daily as needed for allergies. 01/06/14   [provider]  fluconazole  (DIFLUCAN ) 100 MG tablet Take 100 mg by mouth daily. Patient not taking: Reported on 12/07/2023 05/14/23   [provider]  furosemide  (LASIX ) 40 MG tablet May take an extra dose once daily as needed for swelling 05/27/23   Chandrasekhar, Mahesh A, MD  gabapentin  (NEURONTIN ) 300 MG capsule Take 300 mg by mouth at bedtime. 07/28/21   [provider]  hydrALAZINE  (APRESOLINE ) 50 MG tablet TAKE 1 TABLET BY MOUTH THREE TIMES A DAY 06/07/23   Chandrasekhar, Mahesh A, MD  HYDROcodone -acetaminophen  (NORCO) 7.5-325 MG tablet Take 1 tablet by mouth 3 (three) times daily as needed.    [provider]  HYDROcodone -acetaminophen  (NORCO) 7.5-325 MG tablet Take 1 tablet by mouth every 6 (six) hours as needed for moderate pain (pain score 4-6). 12/12/23   Deward Eck, PA-C  hydrocortisone  (ANUSOL -HC) 25 MG suppository Place 25 mg rectally 2 (two) times daily as needed for hemorrhoids. Patient not taking: Reported on 12/07/2023 12/07/20   [provider]  isosorbide  dinitrate (ISORDIL ) 10 MG tablet TAKE 1 TABLET BY MOUTH THREE TIMES A DAY 06/07/23   Chandrasekhar, Mahesh A, MD  losartan  (COZAAR ) 100 MG tablet TAKE 1 TABLET BY MOUTH EVERY DAY 08/15/23   Chandrasekhar, Mahesh A, MD  meclizine  (ANTIVERT ) 25 MG tablet Take 25 mg by mouth 3 (three) times daily as needed for dizziness.    [provider]  Methen-Hyosc-Meth Blue-Na Phos (UROGESIC-BLUE) 81.6 MG TABS Take 1 tablet by mouth every 6 (six) hours as needed (urinary burning). 08/21/21   [provider]  metoprolol  tartrate (LOPRESSOR ) 25 MG tablet TAKE 1 TABLET BY MOUTH TWICE A DAY 08/15/23    Chandrasekhar, Mahesh A, MD  nitroGLYCERIN  (NITROSTAT ) 0.4 MG SL tablet PLACE 1 TABLET UNDER THE TONGE EVERY 5 MINUTES AS NEEDED FOR CHEST PAIN *TAKE UP TO 3 TABLETS* 02/21/23   Chandrasekhar, Mahesh A, MD  nystatin  cream (MYCOSTATIN ) Apply topically 2 (two) times daily. 08/04/20   Eben Reyes BROCKS, MD  ondansetron  (ZOFRAN -ODT) 4 MG disintegrating tablet Take 4 mg by mouth every 8 (eight) hours as needed for nausea (dissolve orally). 07/01/17   [provider]  pantoprazole  (PROTONIX ) 40 MG tablet Take 40 mg by mouth 2 (two) times daily. 04/15/23   [provider]  polyethylene glycol powder (GLYCOLAX/MIRALAX) 17 GM/SCOOP powder Take 17 g by mouth daily as needed for mild constipation.    [provider]  rosuvastatin  (CRESTOR ) 5 MG tablet Take 1 tablet (5 mg total) by mouth 3 (three) times a week. 08/03/22 05/27/23  Santo Stanly LABOR, MD  senna (SENOKOT) 8.6 MG tablet Take 1 tablet by mouth as needed for constipation. Patient not taking: Reported on 12/07/2023 07/28/21   [provider]  spironolactone  (ALDACTONE ) 25 MG tablet Take 1 tablet (25 mg total) by mouth daily. 09/17/23   Chandrasekhar, Stanly LABOR, MD  sucralfate  (CARAFATE ) 1 g tablet Take 1 tablet (1 g total) by mouth 4 (four) times daily -  with meals and at bedtime. Patient taking differently: Take 1 g by mouth daily at 6 (six) AM. 1 tab in am, 1 tab in pm 08/28/21   Patsy Lenis, MD  triamcinolone   cream (KENALOG ) 0.1 % Apply 1 application. topically 2 (two) times daily as needed (rash). Patient not taking: Reported on 12/07/2023 04/06/21   [provider]    Allergies  Allergen Reactions   Codeine Hives, Nausea And Vomiting and Rash   Dimethyl Sulfoxide Hives and Itching   Macrodantin  [Nitrofurantoin  Macrocrystal] Itching, Nausea And Vomiting and Rash    Abdominal and chest pain, also- blood-red skin, also   Moxifloxacin Hives, Shortness Of Breath, Swelling and Rash    Rash was severe, caused  tremors, ans skin became BLOODY RED   Nitrofurantoin  Diarrhea, Itching, Rash and Other (See Comments)    GI/stomach upset, sweating, chest pain, and skin turned bloody red   Quinolones Anaphylaxis   Sulfa Antibiotics Hives and Rash   Tape Rash   Topiramate Itching   Furosemide  Other (See Comments)    CANNOT TAKE DUE TO Interstitial cystitis   Duloxetine Hcl Nausea Only and Other (See Comments)    Vertigo and heart racing also    Ranexa  [Ranolazine ]     Constipation/heart pounding   Robaxin [Methocarbamol] Other (See Comments)    Make me feel flu-like symptoms    REVIEW OF SYSTEMS:  General: no fevers/chills/night sweats Eyes: no blurry vision, diplopia, or amaurosis ENT: no sore throat or hearing loss Resp: no cough, wheezing, or hemoptysis CV: no edema or palpitations GI: no abdominal pain, nausea, vomiting, diarrhea, or constipation GU: no dysuria, frequency, or hematuria Skin: no rash Neuro: no headache, numbness, tingling, or weakness of extremities Musculoskeletal: no joint pain or swelling Heme: no bleeding, DVT, or easy bruising Endo: no polydipsia or polyuria  BP (!) 104/48 (BP Location: Right Arm, Patient Position: Sitting, Cuff Size: Normal)   Pulse 81   Ht 5' 2.5 (1.588 m)   Wt 199 lb (90.3 kg)   SpO2 95%   BMI 35.82 kg/m   PHYSICAL EXAM: GEN:  AO x 3 in no acute distress HEENT: normal Dentition: Normal Neck: JVP normal. +2 carotid upstrokes without bruits. No thyromegaly. Lungs: equal expansion, clear bilaterally CV: Apex is discrete and nondisplaced, RRR with 3/6 SEM Abd: soft, non-tender, non-distended; no bruit; positive bowel sounds Ext: no edema, ecchymoses, or cyanosis Vascular: 2+ femoral pulses, 2+ radial pulses       Skin: warm and dry without rash Neuro: CN II-XII grossly intact; motor and sensory grossly intact    DATA AND STUDIES:  EKG: 2025 sinus rhythm nonspecific ST and T wave abnormality  EKG Interpretation Date/Time:     Ventricular Rate:    PR Interval:    QRS Duration:    QT Interval:    QTC Calculation:   R Axis:      Text Interpretation:          CARDIAC STUDIES: Refer to CV Procedures and Imaging Tabs  12/08/2023: ALT 27 12/10/2023: BUN 15; Creatinine, Ser 0.88; Potassium 4.1; Sodium 138 12/13/2023: Hemoglobin 9.0; Platelets 255   STS RISK CALCULATOR: Pending  NHYA CLASS: 2    ASSESSMENT AND PLAN:   1. Nonrheumatic aortic valve stenosis   2. Atrial flutter, unspecified type (HCC)   3. Coronary artery disease of native artery of native heart with stable angina pectoris   4. Hyperlipidemia LDL goal <70   5. Aortic atherosclerosis   6. Primary hypertension   7. BMI 37.0-37.9, adult   8. Palpitations       Aortic stenosis: Patient with NYHA class II symptoms.  She will be going home in the next week or 2.  She would like to start her evaluation for aortic valve intervention.  We will refer her for coronary angiography right heart catheterization.  She would like this done at the end of January because she is transitioning to go home.  Will also obtain a TAVR protocol CTA and cardiothoracic surgical evaluation.  Will obtain an echocardiogram as well. Atrial flutter: Continue apixaban  5 mg twice daily, Toprol  25 mg at bedtime.  No atrial flutter seen on most recent monitor  CAD: Continue Eliquis  5 mg twice daily, Zetia  10 mg Hyperlipidemia: Intolerant of rosuvastatin  continue Zetia  10 mg Aortic atherosclerosis: Continue Eliquis  5 mg twice daily, Zetia  10 mg daily  Hypertension: Continue hydralazine  50 mg 3 times daily, spironolactone  25 mg daily, Toprol  25 mg daily.  Due to occasional lightheadedness and low blood pressure today we will decrease losartan  to 50 mg.  Elevated BMI: Continue diet and exercise modification Palpitations: Monitor demonstrated multiple SVT episodes, where PACs, and PVCs.  Has had some palpitations.  Continue Toprol  25 mg for now  I have personally reviewed the  patients imaging data as summarized above.  I have reviewed the natural history of aortic stenosis with the patient and family members who are present today. We have discussed the limitations of medical therapy and the poor prognosis associated with symptomatic aortic stenosis. We have also reviewed potential treatment options, including palliative medical therapy, conventional surgical aortic valve replacement, and transcatheter aortic valve replacement. We discussed treatment options in the context of this patient's specific comorbid medical conditions.   All of the patient's questions were answered today. Will make further recommendations based on the results of studies outlined above.   I spent 35 minutes reviewing all clinical data during and prior to this visit including all relevant imaging studies, laboratories, clinical information from other health systems and prior notes from both Cardiology and other specialties, interviewing the patient, conducting a complete physical examination, and coordinating care in order to formulate a comprehensive and personalized evaluation and treatment plan.   Georgeanna Radziewicz K Wah Sabic, MD  03/12/2024 3:22 PM    [facility] [address] [place], [place], [place]  [address] Phone: [phone]; Fax: [phone]

## 2024-03-03 DIAGNOSIS — R110 Nausea: Secondary | ICD-10-CM | POA: Diagnosis not present

## 2024-03-03 DIAGNOSIS — G894 Chronic pain syndrome: Secondary | ICD-10-CM | POA: Diagnosis not present

## 2024-03-03 DIAGNOSIS — K5900 Constipation, unspecified: Secondary | ICD-10-CM | POA: Diagnosis not present

## 2024-03-03 DIAGNOSIS — T50905A Adverse effect of unspecified drugs, medicaments and biological substances, initial encounter: Secondary | ICD-10-CM | POA: Diagnosis not present

## 2024-03-03 DIAGNOSIS — S42351D Displaced comminuted fracture of shaft of humerus, right arm, subsequent encounter for fracture with routine healing: Secondary | ICD-10-CM | POA: Diagnosis not present

## 2024-03-04 DIAGNOSIS — R2681 Unsteadiness on feet: Secondary | ICD-10-CM | POA: Diagnosis not present

## 2024-03-04 DIAGNOSIS — H8110 Benign paroxysmal vertigo, unspecified ear: Secondary | ICD-10-CM | POA: Diagnosis not present

## 2024-03-04 DIAGNOSIS — J189 Pneumonia, unspecified organism: Secondary | ICD-10-CM | POA: Diagnosis not present

## 2024-03-04 DIAGNOSIS — M4986 Spondylopathy in diseases classified elsewhere, lumbar region: Secondary | ICD-10-CM | POA: Diagnosis not present

## 2024-03-04 DIAGNOSIS — G609 Hereditary and idiopathic neuropathy, unspecified: Secondary | ICD-10-CM | POA: Diagnosis not present

## 2024-03-04 DIAGNOSIS — S42351D Displaced comminuted fracture of shaft of humerus, right arm, subsequent encounter for fracture with routine healing: Secondary | ICD-10-CM | POA: Diagnosis not present

## 2024-03-05 ENCOUNTER — Other Ambulatory Visit: Payer: Self-pay | Admitting: Internal Medicine

## 2024-03-05 DIAGNOSIS — I4892 Unspecified atrial flutter: Secondary | ICD-10-CM

## 2024-03-05 NOTE — Telephone Encounter (Signed)
 Prescription refill request for Eliquis  received. Indication: aflutter Last office visit: Thukkani, 01/30/2024 Scr:  0.88, 12/10/2023 Age: 78 yo  Weight: 90.3 kg   Refill sent.

## 2024-03-09 DIAGNOSIS — H8110 Benign paroxysmal vertigo, unspecified ear: Secondary | ICD-10-CM | POA: Diagnosis not present

## 2024-03-09 DIAGNOSIS — J189 Pneumonia, unspecified organism: Secondary | ICD-10-CM | POA: Diagnosis not present

## 2024-03-09 DIAGNOSIS — M4986 Spondylopathy in diseases classified elsewhere, lumbar region: Secondary | ICD-10-CM | POA: Diagnosis not present

## 2024-03-09 DIAGNOSIS — S42351D Displaced comminuted fracture of shaft of humerus, right arm, subsequent encounter for fracture with routine healing: Secondary | ICD-10-CM | POA: Diagnosis not present

## 2024-03-09 DIAGNOSIS — G609 Hereditary and idiopathic neuropathy, unspecified: Secondary | ICD-10-CM | POA: Diagnosis not present

## 2024-03-09 DIAGNOSIS — R2681 Unsteadiness on feet: Secondary | ICD-10-CM | POA: Diagnosis not present

## 2024-03-11 DIAGNOSIS — S42211D Unspecified displaced fracture of surgical neck of right humerus, subsequent encounter for fracture with routine healing: Secondary | ICD-10-CM | POA: Diagnosis not present

## 2024-03-11 DIAGNOSIS — S42351D Displaced comminuted fracture of shaft of humerus, right arm, subsequent encounter for fracture with routine healing: Secondary | ICD-10-CM | POA: Diagnosis not present

## 2024-03-12 ENCOUNTER — Ambulatory Visit: Attending: Internal Medicine | Admitting: Internal Medicine

## 2024-03-12 ENCOUNTER — Encounter: Payer: Self-pay | Admitting: Internal Medicine

## 2024-03-12 VITALS — BP 104/48 | HR 81 | Ht 62.5 in | Wt 199.0 lb

## 2024-03-12 DIAGNOSIS — I350 Nonrheumatic aortic (valve) stenosis: Secondary | ICD-10-CM | POA: Insufficient documentation

## 2024-03-12 DIAGNOSIS — I25118 Atherosclerotic heart disease of native coronary artery with other forms of angina pectoris: Secondary | ICD-10-CM | POA: Diagnosis present

## 2024-03-12 DIAGNOSIS — Z6837 Body mass index (BMI) 37.0-37.9, adult: Secondary | ICD-10-CM | POA: Diagnosis present

## 2024-03-12 DIAGNOSIS — I10 Essential (primary) hypertension: Secondary | ICD-10-CM | POA: Diagnosis present

## 2024-03-12 DIAGNOSIS — I700 Atherosclerosis of aorta: Secondary | ICD-10-CM | POA: Diagnosis present

## 2024-03-12 DIAGNOSIS — E785 Hyperlipidemia, unspecified: Secondary | ICD-10-CM | POA: Insufficient documentation

## 2024-03-12 DIAGNOSIS — R002 Palpitations: Secondary | ICD-10-CM | POA: Diagnosis present

## 2024-03-12 DIAGNOSIS — I4892 Unspecified atrial flutter: Secondary | ICD-10-CM | POA: Insufficient documentation

## 2024-03-12 MED ORDER — LOSARTAN POTASSIUM 50 MG PO TABS: 50.0000 mg | ORAL_TABLET | Freq: Every day | ORAL | 3 refills | Status: AC

## 2024-03-12 NOTE — Progress Notes (Signed)
 Pre Surgical Assessment: 5 M Walk Test  48M=16.74ft  5 Meter Walk Test- trial 1: 9.63 seconds 5 Meter Walk Test- trial 2: 9.68 seconds 5 Meter Walk Test- trial 3: 8.41 seconds 5 Meter Walk Test Average: 9.24 seconds

## 2024-03-12 NOTE — Patient Instructions (Addendum)
 Medication Instructions:  Your physician has recommended you make the following change in your medication:  DECREASE: losartan  to 50 mg by mouth once daily  *If you need a refill on your cardiac medications before your next appointment, please call your pharmacy*  Lab Work: At least 2 weeks prior to Jan 30 you will need a BMP and CBC at any [facility].   If you have labs (blood work) drawn today and your tests are completely normal, you will receive your results only by: [employer] (if you have MyChart) OR A paper copy in the mail If you have any lab test that is abnormal or we need to change your treatment, we will call you to review the results.  Testing/Procedures: Your physician has requested that you have an echocardiogram. Echocardiography is a painless test that uses sound waves to create images of your heart. It provides your doctor with information about the size and shape of your heart and how well your hearts chambers and valves are working. This procedure takes approximately one hour. There are no restrictions for this procedure. Please do NOT wear cologne, perfume, aftershave, or lotions (deodorant is allowed). Please arrive 15 minutes prior to your appointment time.  Please note: We ask at that you not bring children with you during ultrasound (echo/ vascular) testing. Due to room size and safety concerns, children are not allowed in the ultrasound rooms during exams. Our front office staff cannot provide observation of children in our lobby area while testing is being conducted. An adult accompanying a patient to their appointment will only be allowed in the ultrasound room at the discretion of the ultrasound technician under special circumstances. We apologize for any inconvenience.   Your physician has requested that you have a cardiac catheterization. Cardiac catheterization is used to diagnose and/or treat various heart conditions. Doctors may recommend this procedure for  a number of different reasons. The most common reason is to evaluate chest pain. Chest pain can be a symptom of coronary artery disease (CAD), and cardiac catheterization can show whether plaque is narrowing or blocking your hearts arteries. This procedure is also used to evaluate the valves, as well as measure the blood flow and oxygen levels in different parts of your heart. For further information please visit [email or web address] Please follow instruction sheet, as given.   Follow-Up:To be determined At [facility], you and your health needs are our priority.  As part of our continuing mission to provide you with exceptional heart care, our providers are all part of one team.  This team includes your primary Cardiologist (physician) and Advanced Practice Providers or APPs (Physician Assistants and Nurse Practitioners) who all work together to provide you with the care you need, when you need it.   Provider:   Lurena Red, MD   We recommend signing up for the patient portal called MyChart.  Sign up information is provided on this After Visit Summary.  MyChart is used to connect with patients for Virtual Visits (Telemedicine).  Patients are able to view lab/test results, encounter notes, upcoming appointments, etc.  Non-urgent messages can be sent to your provider as well.   To learn more about what you can do with MyChart, go to [email or web address].   Other Instructions  [facility] A DEPT OF [place] [address] A DEPT OF THE [place] [address] [place] [address] Dept: [phone] Loc: [phone]  Her[facility]  03/12/2024  You are scheduled for a Cardiac Catheterization on Friday, January 30 with Dr. Lurena Red.  1. Please arrive at the [facility] (Main Entrance A) at [facility]: [address] [place], [place] [address] at 10:00 AM (This time is 2 hour(s) before your procedure to  ensure your preparation).   Free valet parking service is available. You will check in at ADMITTING. The support person will be asked to wait in the waiting room.  It is OK to have someone drop you off and come back when you are ready to be discharged.    Special note: Every effort is made to have your procedure done on time. Please understand that emergencies sometimes delay scheduled procedures.  2. Diet: Light meals may be eaten up to 6 hours before scheduled procedures from 12N and after; please stop eating at 6:00 AM   Light meal consist of plain toast, fruit, light soups, crackers.   3. Hydration: You need to be well hydrated before your procedure. On January 30, you may drink approved liquids (see below) until 2 hours before the procedure, with 16 oz of water  as your last intake.   List of approved liquids water , clear juice, clear tea, black coffee, fruit juices, non-citric and without pulp, carbonated beverages, Gatorade, Kool -Aid, plain Jello-O and plain ice popsicles.  4. Labs: You will need to have blood drawn At least 2 weeks prior to testing at any [facility].  You do not need to be fasting.  5. Medication instructions in preparation for your procedure:   Contrast Allergy: No   Stop taking Eliquis  (Apixiban) on Wednesday, January 28.  Stop taking, Lasix  (Furosemide )  and Spironolactone   Friday, January 30,    On the morning of your procedure, take your Aspirin  81 mg and any morning medicines NOT listed above.  You may use sips of water .  6. Plan to go home the same day, you will only stay overnight if medically necessary. 7. Bring a current list of your medications and current insurance cards. 8. You MUST have a responsible person to drive you home. 9. Someone MUST be with you the first 24 hours after you arrive home or your discharge will be delayed. 10. Please wear clothes that are easy to get on and off and wear slip-on shoes.  Thank you for allowing us  to care for  you!   -- [facility]

## 2024-03-18 ENCOUNTER — Telehealth: Payer: Self-pay

## 2024-03-18 DIAGNOSIS — H8110 Benign paroxysmal vertigo, unspecified ear: Secondary | ICD-10-CM | POA: Diagnosis not present

## 2024-03-18 DIAGNOSIS — S42351D Displaced comminuted fracture of shaft of humerus, right arm, subsequent encounter for fracture with routine healing: Secondary | ICD-10-CM | POA: Diagnosis not present

## 2024-03-18 DIAGNOSIS — G609 Hereditary and idiopathic neuropathy, unspecified: Secondary | ICD-10-CM | POA: Diagnosis not present

## 2024-03-18 DIAGNOSIS — M4986 Spondylopathy in diseases classified elsewhere, lumbar region: Secondary | ICD-10-CM | POA: Diagnosis not present

## 2024-03-18 DIAGNOSIS — R2681 Unsteadiness on feet: Secondary | ICD-10-CM | POA: Diagnosis not present

## 2024-03-18 NOTE — Telephone Encounter (Signed)
 The patient called asking our team to call her nursing facility to clarify that she had been decreased from 100mg  losartan  to 50mg  at her most recent visit with Dr. Wendel. I called to clarify and they updated her records since they had missed the change on her after visit summary.

## 2024-03-19 ENCOUNTER — Telehealth: Payer: Self-pay

## 2024-03-19 MED ORDER — FUROSEMIDE 20 MG PO TABS: 20.0000 mg | ORAL_TABLET | Freq: Every day | ORAL | Status: DC

## 2024-03-19 NOTE — Telephone Encounter (Signed)
 The patient reported she has been taking Lasix  20 mg daily while at her rehab facility [facility]). She reported Dr. Santo prescribed this and told her she could take an extra 20 mg daily as needed for swelling.  While she reports she is doing great at rehab, is leaving in 2 days, and has no swelling, she wants an order sent to them just in case for the extra 20 mg daily PRN.  In the past, the patient was actually prescribed Lasix  40 mg daily and was instructed to take an extra 40 mg daily as needed for leg swelling (see 05/27/2023 office note).  Since she is doing well taking lasix  20 mg daily and is not having issues, will send over order for lasix  20 mg daily with an extra Lasix  20 mg once daily PRN as needed for swelling to [facility] at fax: [phone] Attn: Keith, RN.  Spoke with Nyesha via telephone about plan as well.  Epic med list updated.

## 2024-04-09 ENCOUNTER — Telehealth: Payer: Self-pay

## 2024-04-09 NOTE — Telephone Encounter (Signed)
 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 Her cath has been r/s from 05/01/24 at 12:00PM to 05/29/24 at 12:00PM. Since the appointment time is the same, she will keep her same instruction letter that she left her consult visit with and just change the date. The patient and I reviewed her instruction sheet over the phone just to make sure she understood all of the instructions in light of the new cath date.  The patient now plans to get a BMP/CBC on 05/15/24 so that she will have up to date labs prior to her cath on 05/29/24. Orders already placed and release at time of consult.  Dr. Wendel has agreed to see the patient on the day of her cath for an H&P since the patient has transportation difficulties and will likely be unable to come in to the office for an H&P prior to the day of her cath.

## 2024-04-17 ENCOUNTER — Telehealth: Payer: Self-pay | Admitting: Hematology and Oncology

## 2024-04-17 NOTE — Telephone Encounter (Signed)
 I spoke with patient as she called in to schedule lab and MD visit for 05/21/2024.

## 2024-04-22 ENCOUNTER — Telehealth: Payer: Self-pay

## 2024-04-22 NOTE — Telephone Encounter (Signed)
 Patient left a VM yesterday after hours with concerns about upcoming appointment for Dr. Loretha given her surgery last September after sustaining a fall, and need for multiple blood transfusions.   Returned patient's call but was not able to speak with patient. Left VM requesting return call when patient she's able

## 2024-04-22 NOTE — Telephone Encounter (Signed)
 Patient returned call and wanted to clarify as to whether or not she would need additional lab work drawn at her appointment on 05/21/24 prior to seeing Dr. Loretha. Patient confirmed that she has a F/U appointment scheduled with her PCP Charmaine Bright, PA-C at [facility] next Friday 05/01/24. Informed patient that her PCP will handle necessary F/U labs since patient's surgery/rehab at SNF, and if there are any abnormalities she does not feel comfortable addressing, she will let Dr. Loretha know so they can be addressed at patient's 05/21/24 visit. Patient verbalized understanding and appreciation of call. Denied any other needs at this time, but was encouraged to call back should any new needs arise.

## 2024-04-23 ENCOUNTER — Ambulatory Visit (HOSPITAL_COMMUNITY)

## 2024-05-01 ENCOUNTER — Other Ambulatory Visit: Payer: Self-pay | Admitting: Internal Medicine

## 2024-05-05 NOTE — Telephone Encounter (Signed)
 Labs on 12/10/23 Outside of Normal  In accordance with refill protocols, please review and address the following requirements before this medication refill can be authorized:  Labs

## 2024-05-08 MED ORDER — FUROSEMIDE 20 MG PO TABS: 20.0000 mg | ORAL_TABLET | Freq: Every day | ORAL | 1 refills | Status: AC

## 2024-05-14 ENCOUNTER — Ambulatory Visit (HOSPITAL_COMMUNITY)

## 2024-05-19 ENCOUNTER — Ambulatory Visit: Admitting: Neurology

## 2024-05-21 ENCOUNTER — Inpatient Hospital Stay: Admitting: Hematology and Oncology

## 2024-05-21 ENCOUNTER — Inpatient Hospital Stay

## 2024-05-29 ENCOUNTER — Encounter (HOSPITAL_COMMUNITY): Payer: Self-pay

## 2024-05-29 ENCOUNTER — Ambulatory Visit (HOSPITAL_COMMUNITY): Admit: 2024-05-29 | Admitting: Internal Medicine

## 2024-05-29 SURGERY — RIGHT/LEFT HEART CATH AND CORONARY ANGIOGRAPHY
Anesthesia: LOCAL

## 2024-06-02 ENCOUNTER — Ambulatory Visit: Admitting: Neurology
